# Patient Record
Sex: Female | Born: 1955
Health system: Southern US, Community
[De-identification: ages and names within clinical notes are randomized; demographics above are authoritative.]

## PROBLEM LIST (undated history)

## (undated) DIAGNOSIS — M1990 Unspecified osteoarthritis, unspecified site: Secondary | ICD-10-CM

## (undated) DIAGNOSIS — K08109 Complete loss of teeth, unspecified cause, unspecified class: Secondary | ICD-10-CM

## (undated) DIAGNOSIS — J449 Chronic obstructive pulmonary disease, unspecified: Secondary | ICD-10-CM

## (undated) DIAGNOSIS — F329 Major depressive disorder, single episode, unspecified: Secondary | ICD-10-CM

## (undated) DIAGNOSIS — E782 Mixed hyperlipidemia: Secondary | ICD-10-CM

## (undated) DIAGNOSIS — I2510 Atherosclerotic heart disease of native coronary artery without angina pectoris: Secondary | ICD-10-CM

## (undated) DIAGNOSIS — E119 Type 2 diabetes mellitus without complications: Secondary | ICD-10-CM

## (undated) DIAGNOSIS — E538 Deficiency of other specified B group vitamins: Secondary | ICD-10-CM

## (undated) DIAGNOSIS — N181 Chronic kidney disease, stage 1: Secondary | ICD-10-CM

## (undated) DIAGNOSIS — K603 Anal fistula, unspecified: Secondary | ICD-10-CM

## (undated) DIAGNOSIS — R7303 Prediabetes: Secondary | ICD-10-CM

## (undated) DIAGNOSIS — Z7901 Long term (current) use of anticoagulants: Secondary | ICD-10-CM

## (undated) DIAGNOSIS — I1A0 Resistant hypertension: Secondary | ICD-10-CM

## (undated) DIAGNOSIS — F411 Generalized anxiety disorder: Secondary | ICD-10-CM

## (undated) DIAGNOSIS — G4733 Obstructive sleep apnea (adult) (pediatric): Secondary | ICD-10-CM

## (undated) DIAGNOSIS — G4730 Sleep apnea, unspecified: Secondary | ICD-10-CM

## (undated) DIAGNOSIS — K629 Disease of anus and rectum, unspecified: Secondary | ICD-10-CM

## (undated) DIAGNOSIS — R112 Nausea with vomiting, unspecified: Secondary | ICD-10-CM

## (undated) DIAGNOSIS — F419 Anxiety disorder, unspecified: Secondary | ICD-10-CM

## (undated) DIAGNOSIS — K5090 Crohn's disease, unspecified, without complications: Secondary | ICD-10-CM

## (undated) DIAGNOSIS — K56609 Unspecified intestinal obstruction, unspecified as to partial versus complete obstruction: Secondary | ICD-10-CM

## (undated) DIAGNOSIS — C44629 Squamous cell carcinoma of skin of left upper limb, including shoulder: Secondary | ICD-10-CM

## (undated) DIAGNOSIS — Z8719 Personal history of other diseases of the digestive system: Secondary | ICD-10-CM

## (undated) DIAGNOSIS — Z9889 Other specified postprocedural states: Secondary | ICD-10-CM

## (undated) DIAGNOSIS — I10 Essential (primary) hypertension: Secondary | ICD-10-CM

## (undated) DIAGNOSIS — T7840XA Allergy, unspecified, initial encounter: Secondary | ICD-10-CM

## (undated) DIAGNOSIS — K5080 Crohn's disease of both small and large intestine without complications: Secondary | ICD-10-CM

## (undated) DIAGNOSIS — F32A Depression, unspecified: Secondary | ICD-10-CM

## (undated) DIAGNOSIS — E559 Vitamin D deficiency, unspecified: Secondary | ICD-10-CM

## (undated) DIAGNOSIS — N63 Unspecified lump in unspecified breast: Secondary | ICD-10-CM

## (undated) HISTORY — PX: ABDOMINAL HYSTERECTOMY: SHX81

## (undated) HISTORY — DX: Essential (primary) hypertension: I10

## (undated) HISTORY — DX: Anxiety disorder, unspecified: F41.9

## (undated) HISTORY — DX: Allergy, unspecified, initial encounter: T78.40XA

## (undated) HISTORY — DX: Crohn's disease, unspecified, without complications: K50.90

## (undated) HISTORY — DX: Major depressive disorder, single episode, unspecified: F32.9

## (undated) HISTORY — DX: Chronic obstructive pulmonary disease, unspecified: J44.9

## (undated) HISTORY — PX: APPENDECTOMY: SHX54

## (undated) HISTORY — DX: Unspecified intestinal obstruction, unspecified as to partial versus complete obstruction: K56.609

## (undated) HISTORY — DX: Type 2 diabetes mellitus without complications: E11.9

## (undated) HISTORY — PX: TONSILLECTOMY: SUR1361

## (undated) HISTORY — PX: SMALL INTESTINE SURGERY: SHX150

## (undated) HISTORY — DX: Vitamin D deficiency, unspecified: E55.9

## (undated) HISTORY — DX: Unspecified lump in unspecified breast: N63.0

## (undated) HISTORY — PX: BREAST SURGERY: SHX581

## (undated) HISTORY — DX: Squamous cell carcinoma of skin of left upper limb, including shoulder: C44.629

## (undated) HISTORY — DX: Depression, unspecified: F32.A

---
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
## 1998-02-16 ENCOUNTER — Emergency Department (HOSPITAL_COMMUNITY): Admission: EM | Admit: 1998-02-16 | Discharge: 1998-02-16 | Payer: Self-pay | Admitting: Emergency Medicine

## 1998-04-30 ENCOUNTER — Encounter: Payer: Self-pay | Admitting: Emergency Medicine

## 1998-04-30 ENCOUNTER — Inpatient Hospital Stay (HOSPITAL_COMMUNITY): Admission: EM | Admit: 1998-04-30 | Discharge: 1998-05-02 | Payer: Self-pay | Admitting: Emergency Medicine

## 1998-10-07 ENCOUNTER — Other Ambulatory Visit: Admission: RE | Admit: 1998-10-07 | Discharge: 1998-10-07 | Payer: Self-pay | Admitting: Family Medicine

## 1999-10-20 ENCOUNTER — Encounter: Admission: RE | Admit: 1999-10-20 | Discharge: 1999-10-20 | Payer: Self-pay | Admitting: Family Medicine

## 1999-10-20 ENCOUNTER — Encounter: Payer: Self-pay | Admitting: Family Medicine

## 2000-06-03 ENCOUNTER — Ambulatory Visit (HOSPITAL_COMMUNITY): Admission: RE | Admit: 2000-06-03 | Discharge: 2000-06-03 | Payer: Self-pay | Admitting: Family Medicine

## 2000-08-28 ENCOUNTER — Observation Stay (HOSPITAL_COMMUNITY): Admission: EM | Admit: 2000-08-28 | Discharge: 2000-08-29 | Payer: Self-pay | Admitting: Emergency Medicine

## 2000-08-28 ENCOUNTER — Encounter: Payer: Self-pay | Admitting: Gastroenterology

## 2001-01-07 ENCOUNTER — Other Ambulatory Visit: Admission: RE | Admit: 2001-01-07 | Discharge: 2001-01-07 | Payer: Self-pay | Admitting: Family Medicine

## 2001-09-20 ENCOUNTER — Emergency Department (HOSPITAL_COMMUNITY): Admission: EM | Admit: 2001-09-20 | Discharge: 2001-09-21 | Payer: Self-pay | Admitting: Emergency Medicine

## 2001-09-21 ENCOUNTER — Encounter: Payer: Self-pay | Admitting: Emergency Medicine

## 2002-02-10 ENCOUNTER — Other Ambulatory Visit: Admission: RE | Admit: 2002-02-10 | Discharge: 2002-02-10 | Payer: Self-pay | Admitting: Family Medicine

## 2002-11-06 ENCOUNTER — Encounter: Admission: RE | Admit: 2002-11-06 | Discharge: 2002-11-06 | Payer: Self-pay | Admitting: Family Medicine

## 2002-11-06 ENCOUNTER — Encounter: Payer: Self-pay | Admitting: Family Medicine

## 2004-11-07 ENCOUNTER — Emergency Department (HOSPITAL_COMMUNITY): Admission: EM | Admit: 2004-11-07 | Discharge: 2004-11-07 | Payer: Self-pay | Admitting: Emergency Medicine

## 2005-08-20 ENCOUNTER — Emergency Department (HOSPITAL_COMMUNITY): Admission: EM | Admit: 2005-08-20 | Discharge: 2005-08-20 | Payer: Self-pay | Admitting: Emergency Medicine

## 2005-11-07 ENCOUNTER — Encounter: Admission: RE | Admit: 2005-11-07 | Discharge: 2005-11-07 | Payer: Self-pay | Admitting: Gastroenterology

## 2006-01-29 ENCOUNTER — Encounter: Payer: Self-pay | Admitting: Emergency Medicine

## 2006-01-30 ENCOUNTER — Inpatient Hospital Stay (HOSPITAL_COMMUNITY): Admission: EM | Admit: 2006-01-30 | Discharge: 2006-02-01 | Payer: Self-pay | Admitting: Internal Medicine

## 2006-04-28 ENCOUNTER — Inpatient Hospital Stay (HOSPITAL_COMMUNITY): Admission: AD | Admit: 2006-04-28 | Discharge: 2006-05-02 | Payer: Self-pay | Admitting: Psychiatry

## 2006-04-28 ENCOUNTER — Ambulatory Visit: Payer: Self-pay | Admitting: Psychiatry

## 2006-08-12 ENCOUNTER — Ambulatory Visit: Payer: Self-pay | Admitting: Internal Medicine

## 2006-09-23 ENCOUNTER — Ambulatory Visit: Payer: Self-pay | Admitting: Internal Medicine

## 2006-09-26 ENCOUNTER — Ambulatory Visit (HOSPITAL_COMMUNITY): Admission: RE | Admit: 2006-09-26 | Discharge: 2006-09-26 | Payer: Self-pay | Admitting: Family Medicine

## 2007-03-24 ENCOUNTER — Emergency Department (HOSPITAL_COMMUNITY): Admission: EM | Admit: 2007-03-24 | Discharge: 2007-03-25 | Payer: Self-pay | Admitting: Emergency Medicine

## 2007-04-14 ENCOUNTER — Telehealth (INDEPENDENT_AMBULATORY_CARE_PROVIDER_SITE_OTHER): Payer: Self-pay | Admitting: Nurse Practitioner

## 2007-08-04 ENCOUNTER — Ambulatory Visit: Payer: Self-pay | Admitting: Nurse Practitioner

## 2007-08-04 DIAGNOSIS — J069 Acute upper respiratory infection, unspecified: Secondary | ICD-10-CM | POA: Insufficient documentation

## 2007-08-04 DIAGNOSIS — I10 Essential (primary) hypertension: Secondary | ICD-10-CM | POA: Insufficient documentation

## 2007-08-18 ENCOUNTER — Telehealth (INDEPENDENT_AMBULATORY_CARE_PROVIDER_SITE_OTHER): Payer: Self-pay | Admitting: Nurse Practitioner

## 2007-08-20 ENCOUNTER — Ambulatory Visit: Payer: Self-pay | Admitting: *Deleted

## 2007-08-26 ENCOUNTER — Ambulatory Visit: Payer: Self-pay | Admitting: Family Medicine

## 2007-08-26 ENCOUNTER — Telehealth (INDEPENDENT_AMBULATORY_CARE_PROVIDER_SITE_OTHER): Payer: Self-pay | Admitting: Internal Medicine

## 2007-08-26 DIAGNOSIS — K5090 Crohn's disease, unspecified, without complications: Secondary | ICD-10-CM | POA: Insufficient documentation

## 2007-08-26 DIAGNOSIS — F32A Depression, unspecified: Secondary | ICD-10-CM | POA: Insufficient documentation

## 2007-08-26 DIAGNOSIS — R070 Pain in throat: Secondary | ICD-10-CM | POA: Insufficient documentation

## 2007-08-26 DIAGNOSIS — F329 Major depressive disorder, single episode, unspecified: Secondary | ICD-10-CM

## 2008-02-24 ENCOUNTER — Ambulatory Visit: Payer: Self-pay | Admitting: Nurse Practitioner

## 2008-02-24 DIAGNOSIS — F17200 Nicotine dependence, unspecified, uncomplicated: Secondary | ICD-10-CM | POA: Insufficient documentation

## 2008-02-24 DIAGNOSIS — H6120 Impacted cerumen, unspecified ear: Secondary | ICD-10-CM | POA: Insufficient documentation

## 2008-02-24 DIAGNOSIS — J309 Allergic rhinitis, unspecified: Secondary | ICD-10-CM | POA: Insufficient documentation

## 2008-04-30 ENCOUNTER — Ambulatory Visit: Payer: Self-pay | Admitting: Nurse Practitioner

## 2008-06-17 ENCOUNTER — Telehealth (INDEPENDENT_AMBULATORY_CARE_PROVIDER_SITE_OTHER): Payer: Self-pay | Admitting: Nurse Practitioner

## 2008-10-27 ENCOUNTER — Encounter (INDEPENDENT_AMBULATORY_CARE_PROVIDER_SITE_OTHER): Payer: Self-pay | Admitting: Nurse Practitioner

## 2008-12-31 ENCOUNTER — Encounter (INDEPENDENT_AMBULATORY_CARE_PROVIDER_SITE_OTHER): Payer: Self-pay | Admitting: Nurse Practitioner

## 2008-12-31 ENCOUNTER — Ambulatory Visit: Payer: Self-pay | Admitting: Nurse Practitioner

## 2008-12-31 DIAGNOSIS — E8941 Symptomatic postprocedural ovarian failure: Secondary | ICD-10-CM | POA: Insufficient documentation

## 2008-12-31 LAB — CONVERTED CEMR LAB
Blood Glucose, Fingerstick: 93
Protein, U semiquant: 30
Specific Gravity, Urine: >=1.03
WBC Urine, dipstick: NEGATIVE
pH: 5.5

## 2009-01-06 ENCOUNTER — Encounter (INDEPENDENT_AMBULATORY_CARE_PROVIDER_SITE_OTHER): Payer: Self-pay | Admitting: Nurse Practitioner

## 2009-01-06 LAB — CONVERTED CEMR LAB
ALT: 25 units/L (ref 0–35)
AST: 23 units/L (ref 0–37)
Albumin: 4.1 g/dL (ref 3.5–5.2)
Alkaline Phosphatase: 65 units/L (ref 39–117)
BUN: 12 mg/dL (ref 6–23)
CO2: 22 meq/L (ref 19–32)
Chloride: 106 meq/L (ref 96–112)
Creatinine, Ser: 0.86 mg/dL (ref 0.40–1.20)
Eosinophils Relative: 2 % (ref 0–5)
Glucose, Bld: 84 mg/dL (ref 70–99)
HDL: 35 mg/dL — ABNORMAL LOW (ref >39)
HIV-2 Ab: NEGATIVE
Lymphocytes Relative: 37 % (ref 12–46)
Lymphs Abs: 4.5 10*3/uL — ABNORMAL HIGH (ref 0.7–4.0)
Monocytes Absolute: 1 10*3/uL (ref 0.1–1.0)
Monocytes Relative: 8 % (ref 3–12)
Neutro Abs: 6.6 10*3/uL (ref 1.7–7.7)
Platelets: 278 10*3/uL (ref 150–400)
RBC: 4.74 M/uL (ref 3.87–5.11)
Sodium: 145 meq/L (ref 135–145)
Total Protein: 7.1 g/dL (ref 6.0–8.3)
Triglycerides: 137 mg/dL (ref <150)

## 2009-01-07 ENCOUNTER — Encounter (INDEPENDENT_AMBULATORY_CARE_PROVIDER_SITE_OTHER): Payer: Self-pay | Admitting: Nurse Practitioner

## 2009-01-10 ENCOUNTER — Ambulatory Visit (HOSPITAL_COMMUNITY): Admission: RE | Admit: 2009-01-10 | Discharge: 2009-01-10 | Payer: Self-pay | Admitting: Internal Medicine

## 2009-01-11 ENCOUNTER — Encounter (INDEPENDENT_AMBULATORY_CARE_PROVIDER_SITE_OTHER): Payer: Self-pay | Admitting: Nurse Practitioner

## 2009-01-11 ENCOUNTER — Ambulatory Visit: Payer: Self-pay | Admitting: Internal Medicine

## 2009-01-24 ENCOUNTER — Encounter (INDEPENDENT_AMBULATORY_CARE_PROVIDER_SITE_OTHER): Payer: Self-pay | Admitting: Nurse Practitioner

## 2009-04-01 ENCOUNTER — Ambulatory Visit: Payer: Self-pay | Admitting: Nurse Practitioner

## 2009-04-01 DIAGNOSIS — IMO0001 Reserved for inherently not codable concepts without codable children: Secondary | ICD-10-CM | POA: Insufficient documentation

## 2009-05-05 ENCOUNTER — Encounter (INDEPENDENT_AMBULATORY_CARE_PROVIDER_SITE_OTHER): Payer: Self-pay | Admitting: Nurse Practitioner

## 2009-05-14 LAB — HM COLONOSCOPY: HM Colonoscopy: NORMAL

## 2009-05-14 LAB — HM MAMMOGRAPHY: HM Mammogram: NORMAL

## 2009-07-05 ENCOUNTER — Ambulatory Visit: Payer: Self-pay | Admitting: Nurse Practitioner

## 2009-07-19 ENCOUNTER — Ambulatory Visit: Payer: Self-pay | Admitting: Nurse Practitioner

## 2009-12-30 ENCOUNTER — Telehealth (INDEPENDENT_AMBULATORY_CARE_PROVIDER_SITE_OTHER): Payer: Self-pay | Admitting: Nurse Practitioner

## 2010-01-05 ENCOUNTER — Telehealth (INDEPENDENT_AMBULATORY_CARE_PROVIDER_SITE_OTHER): Payer: Self-pay | Admitting: Nurse Practitioner

## 2010-01-30 ENCOUNTER — Ambulatory Visit: Payer: Self-pay | Admitting: Nurse Practitioner

## 2010-01-30 DIAGNOSIS — K5900 Constipation, unspecified: Secondary | ICD-10-CM | POA: Insufficient documentation

## 2010-01-30 LAB — CONVERTED CEMR LAB
AST: 21 units/L (ref 0–37)
Alkaline Phosphatase: 57 units/L (ref 39–117)
Bilirubin Urine: NEGATIVE
CO2: 24 meq/L (ref 19–32)
Creatinine, Ser: 0.8 mg/dL (ref 0.40–1.20)
Creatinine, Urine: 132.9 mg/dL
Glucose, Bld: 99 mg/dL (ref 70–99)
HDL: 38 mg/dL — ABNORMAL LOW (ref >39)
Ketones, urine, test strip: NEGATIVE
LDL Cholesterol: 101 mg/dL — ABNORMAL HIGH (ref 0–99)
Microalb Creat Ratio: 3.8 mg/g (ref 0.0–30.0)
Microalb, Ur: 0.5 mg/dL (ref 0.00–1.89)
Nitrite: NEGATIVE
Specific Gravity, Urine: 1.015
TSH: 0.695 microintl units/mL (ref 0.350–4.500)
Total Bilirubin: 0.5 mg/dL (ref 0.3–1.2)
Total CHOL/HDL Ratio: 4.2
Total Protein: 7 g/dL (ref 6.0–8.3)
Triglycerides: 103 mg/dL (ref <150)
pH: 5.5

## 2010-01-31 ENCOUNTER — Encounter (INDEPENDENT_AMBULATORY_CARE_PROVIDER_SITE_OTHER): Payer: Self-pay | Admitting: Nurse Practitioner

## 2010-02-02 ENCOUNTER — Ambulatory Visit (HOSPITAL_COMMUNITY): Admission: RE | Admit: 2010-02-02 | Discharge: 2010-02-02 | Payer: Self-pay | Admitting: Internal Medicine

## 2010-06-15 NOTE — Letter (Signed)
Summary: TEST ORDER MAMMOGRAM//APPT DATE &TIME  TEST ORDER MAMMOGRAM//APPT DATE &TIME   Imported By: Arta Bruce 01/30/2010 12:21:33  _____________________________________________________________________  External Attachment:    Type:   Image     Comment:   External Document

## 2010-06-15 NOTE — Assessment & Plan Note (Signed)
Summary: Complete Physical Exam   Vital Signs:  Patient profile:   55 year old female Menstrual status:  partial hysterctomy Height:      63 inches Weight:      238 pounds Temp:     97.1 degrees F oral Pulse rate:   76 / minute Pulse rhythm:   regular Resp:     18 per minute BP sitting:   120 / 82  (left arm) Cuff size:   large CC: Depression, Hypertension Management Is Patient Diabetic? No Pain Assessment Patient in pain? no       Does patient need assistance? Functional Status Self care Ambulation Normal   CC:  Depression and Hypertension Management.  History of Present Illness:  Pt into the office for a complete physical exam.  PAP - last done in 12/2008 but pt is s/p partial hysterectomy.  No family hx of cervical or ovarian cancer.  Mammogram - last done in 2010.  No family history of breast cancer. Pt does check her breasts at home.   Pt did not bring her medications into the office.  Obesity - pt admits that she has gained weight since her last visit here.  She has gained 6 pounds since her last visit.  She slacked off her exercise routine.     The patient denies that she feels like life is not worth living, denies that she wishes that she were dead, and denies that she has thought about ending her life.        Comments:  Pt is going to the [facility]. She is did not bring her medications with her today to this office but reports she is taking the same medications.  Hypertension History:      She denies headache, chest pain, and palpitations.  She notes no problems with any antihypertensive medication side effects.        Positive major cardiovascular risk factors include hypertension and current tobacco user.  Negative major cardiovascular risk factors include female age less than 23 years old, no history of diabetes or hyperlipidemia, and negative family history for ischemic heart disease.        Further assessment for target organ damage reveals no  history of ASHD, cardiac end-organ damage (CHF/LVH), stroke/TIA, peripheral vascular disease, renal insufficiency, or hypertensive retinopathy.      Habits & Providers  Alcohol-Tobacco-Diet     Alcohol drinks/day: 0     Tobacco Status: current     Tobacco Counseling: to quit use of tobacco products     Cigarette Packs/Day: 1.0  Exercise-Depression-Behavior     Does Patient Exercise: no     Depression Counseling: not indicated; screening negative for depression     Seat Belt Use: 100     Sun Exposure: occasionally  Current Medications (verified): 1)  Lisinopril-Hydrochlorothiazide 20-25 Mg  Tabs (Lisinopril-Hydrochlorothiazide) .Marland Kitchen.. 1 Tablet By Mouth Daily For Blood Pressure 2)  Wellbutrin Xl 150 Mg  Tb24 (Bupropion Hcl) .... Take 1 Tablet By Mouth Every 12 Hours(Per [facility]) 3)  Lamictal 100 Mg  Tabs (Lamotrigine) .... Take 1 Tablet By Mouth Every 12 Hours(Per [facility]) 4)  Zoloft 50 Mg  Tabs (Sertraline Hcl) .... Take 1 Tablet By Mouth Once A Day (Per [facility]) 5)  Trazodone Hcl 50 Mg  Tabs (Trazodone Hcl) .... Take 1-2 Tablets By Mouth As Needed Insomnia (Per [facility]) 6)  Loratadine 10 Mg Tabs (Loratadine) .... One Tablet By Mouth Daily For Allergies 7)  Flonase 50 Mcg/act Susp (Fluticasone Propionate) .Marland KitchenMarland KitchenMarland Kitchen  One Spray in Each Nostril Two Times A Day  (Hold Head Down)  Allergies (verified): 1)  ! * Azathioprine  Review of Systems General:  Complains of fatigue. Eyes:  Denies blurring. ENT:  Denies earache. CV:  Complains of fatigue. Resp:  Denies cough. GI:  Complains of constipation; denies abdominal pain, nausea, and vomiting. GU:  Denies discharge. MS:  Denies joint pain. Derm:  Denies rash. Neuro:  Denies headaches. Psych:  Denies anxiety and depression.  Physical Exam  General:  alert.   Head:  normocephalic.   Eyes:  pupils equal and pupils round.   Ears:  Bil TM with minimal cerumen present bony landmarks visible  Nose:  no nasal discharge.   Mouth:  pharynx pink and  moist.   Neck:  supple.   Chest Wall:  no mass.   Breasts:  right breast with surgical scar Lungs:  normal breath sounds.   Heart:  normal rate and regular rhythm.   Abdomen:  soft, non-tender, and normal bowel sounds.  mid-abdominal incision Rectal:  defer Genitalia:  defer Msk:  up to the exam table Extremities:  no edema Neurologic:  alert & oriented X3.     Impression & Recommendations:  Problem # 1:  HYPERTENSION, BENIGN ESSENTIAL (ICD-401.1) labs done  PAP defered colonscopy up to date rec optho and dental exam PHQ-9 score = 15 but pt already a pt at the [facility] rec optho and dental exams Her updated medication list for this problem includes:    Lisinopril-hydrochlorothiazide 20-25 Mg Tabs (Lisinopril-hydrochlorothiazide) .Marland Kitchen... 1 tablet by mouth daily for blood pressure  Orders: UA Dipstick w/o Micro (manual) (81002) T-Urine Microalbumin w/creat. ratio [phone]) T-Lipid Profile [phone]) T-Comprehensive Metabolic Panel (50354-65681) Rapid HIV  (27517) T-TSH (00174-94496) EKG w/ Interpretation (93000)  Problem # 2:  UNSPECIFIED BREAST SCREENING (ICD-V76.10) self breast exams encouraged mammogram scheduled Orders: Mammogram (Screening) (Mammo)  Problem # 3:  TOBACCO ABUSE (ICD-305.1) advised cessation  Problem # 4:  OBESITY (ICD-278.00) reviewed BMI with pt  up 6 pounds since last visit  need to increase exercise Orders: T-TSH (75916-38466)  Problem # 5:  CONSTIPATION (ICD-564.00) advised pt to start a high fiber diet drink plenty of water fiber one samples given  Problem # 6:  ALLERGIC RHINITIS (ICD-477.9)  Her updated medication list for this problem includes:    Loratadine 10 Mg Tabs (Loratadine) ..... One tablet by mouth daily for allergies    Flonase 50 Mcg/act Susp (Fluticasone propionate) ..... One spray in each nostril two times a day  (hold head down)  Complete Medication List: 1)  Lisinopril-hydrochlorothiazide 20-25 Mg  Tabs (Lisinopril-hydrochlorothiazide) .Marland Kitchen.. 1 tablet by mouth daily for blood pressure 2)  Wellbutrin Xl 150 Mg Tb24 (Bupropion hcl) .... Take 1 tablet by mouth every 12 hours(per [facility]) 3)  Lamictal 100 Mg Tabs (Lamotrigine) .... Take 1 tablet by mouth every 12 hours(per [facility]) 4)  Zoloft 50 Mg Tabs (Sertraline hcl) .... Take 1 tablet by mouth once a day (per [facility]) 5)  Trazodone Hcl 50 Mg Tabs (Trazodone hcl) .... Take 1-2 tablets by mouth as needed insomnia (per [facility]) 6)  Loratadine 10 Mg Tabs (Loratadine) .... One tablet by mouth daily for allergies 7)  Flonase 50 Mcg/act Susp (Fluticasone propionate) .... One spray in each nostril two times a day  (hold head down)  Hypertension Assessment/Plan:      The patient's hypertensive risk group is category B: At least one risk factor (excluding diabetes) with no target organ damage.  Her calculated 10 year risk of coronary heart disease is 13 %.  Today's blood pressure is 120/82.  Her blood pressure goal is < 140/90.  Patient Instructions: 1)  Schedule a lab visit in 4-6 weeks for flu vaccine 2)  Constipation - start eating more foods with fiber such as fiber one, oatmeal, and raisins, apples with peeling. 3)  You will be notified of your lab results 4)  Follow up in 6 months with n.martin,fnp for high blood pressure Prescriptions: LORATADINE 10 MG TABS (LORATADINE) One tablet by mouth daily for allergies  #30 x 5   Entered and Authorized by:   Aurora Mask FNP   Signed by:   Aurora Mask FNP on 01/30/2010   Method used:   Electronically to        [facility] [phone]* (retail)       [address]       [place], [place]  [address]       Ph: [phone]       Fax: [phone]   RxID:   [id number]   Prevention & Chronic Care Immunizations   Influenza vaccine: Fluvax 3+  (04/01/2009)    Tetanus booster: 12/31/2008: Tdap    Pneumococcal vaccine: Not documented  Colorectal Screening   Hemoccult: Not documented   Hemoccult  action/deferral: Not indicated  (01/30/2010)    Colonoscopy: Done by Dr. Benson Norway  (12/12/2009)  Other Screening   Pap smear:  Specimen Adequacy: Satisfactory for evaluation.   Interpretation/Result:Negative for intraepithelial Lesion or Malignancy.     (12/31/2008)   Pap smear action/deferral: Deferred-3 yr interval  (01/30/2010)   Pap smear due: 01/01/2012    Mammogram: ASSESSMENT: Negative - BI-RADS 1^MM DIGITAL SCREENING  (01/10/2009)   Mammogram action/deferral: Ordered  (01/30/2010)   Smoking status: current  (01/30/2010)   Smoking cessation counseling: yes  (02/24/2008)  Lipids   Total Cholesterol: 181  (12/31/2008)   Lipid panel action/deferral: Lipid Panel ordered   LDL: 119  (12/31/2008)   LDL Direct: Not documented   HDL: 35  (12/31/2008)   Triglycerides: 137  (12/31/2008)  Hypertension   Last Blood Pressure: 120 / 82  (01/30/2010)   Serum creatinine: 0.86  (12/31/2008)   Serum potassium 4.0  (12/31/2008) CMP ordered   Self-Management Support :    Hypertension self-management support: Pre-printed educational material  (01/30/2010)   EKG  Procedure date:  01/30/2010  Findings:      NSR   Laboratory Results   Urine Tests  Date/Time Received: January 30, 2010 9:37 AM   Routine Urinalysis   Color: lt. yellow Glucose: negative   (Normal Range: Negative) Bilirubin: negative   (Normal Range: Negative) Ketone: negative   (Normal Range: Negative) Spec. Gravity: 1.015   (Normal Range: 1.003-1.035) Blood: negative   (Normal Range: Negative) pH: 5.5   (Normal Range: 5.0-8.0) Protein: negative   (Normal Range: Negative) Urobilinogen: 0.2   (Normal Range: 0-1) Nitrite: negative   (Normal Range: Negative) Leukocyte Esterace: negative   (Normal Range: Negative)     Blood Tests   Date/Time Received: January 30, 2010 10:55 AM    Date/Time Received: January 30, 2010     Appended Document: Complete Physical Exam    Clinical Lists  Changes  Observations: Added new observation of HIVRAPIDRSLT: negative (01/30/2010 15:48)      Laboratory Results    Other Tests  Rapid HIV: negative

## 2010-06-15 NOTE — Assessment & Plan Note (Signed)
Summary: HTN/Cerumen impaction   Vital Signs:  Patient profile:   55 year old female Menstrual status:  partial hysterctomy Weight:      233.1 pounds BMI:     41.44 BSA:     2.07 Temp:     98.2 degrees F rectal Pulse rate:   71 / minute Pulse rhythm:   regular Resp:     16 per minute BP sitting:   120 / 68  (left arm) Cuff size:   large  Vitals Entered By: Levon Hedger (July 05, 2009 11:45 AM) CC: follow-up visit 3 months....pt states she is need her ears cleaned out, Hypertension Management, Depression Is Patient Diabetic? No Pain Assessment Patient in pain? no       Does patient need assistance? Functional Status Self care Ambulation Normal   CC:  follow-up visit 3 months....pt states she is need her ears cleaned out, Hypertension Management, and Depression.  History of Present Illness:  Pt into the office for f/u on htn.  Acute complaint today is decreased hearing. pt did nt return following her last visit to get ears irrigated.   The patient denies that she feels like life is not worth living, denies that she wishes that she were dead, and denies that she has thought about ending her life.        Comments:  Pt continues to f/u at the [facility].  Hypertension History:      She denies headache, chest pain, and palpitations.  She notes no problems with any antihypertensive medication side effects.  pt is taking meds as ordered.        Positive major cardiovascular risk factors include hypertension and current tobacco user.  Negative major cardiovascular risk factors include female age less than 75 years old and negative family history for ischemic heart disease.        Further assessment for target organ damage reveals no history of ASHD, cardiac end-organ damage (CHF/LVH), stroke/TIA, peripheral vascular disease, renal insufficiency, or hypertensive retinopathy.      Habits & Providers  Alcohol-Tobacco-Diet     Alcohol drinks/day: 0     Tobacco  Status: current     Tobacco Counseling: to quit use of tobacco products  Exercise-Depression-Behavior     Does Patient Exercise: no     Depression Counseling: not indicated; screening negative for depression     Seat Belt Use: 100     Sun Exposure: occasionally  Current Medications (verified): 1)  Lisinopril-Hydrochlorothiazide 20-25 Mg  Tabs (Lisinopril-Hydrochlorothiazide) .Marland Kitchen.. 1 Tablet By Mouth Daily For Blood Pressure 2)  Wellbutrin Xl 150 Mg  Tb24 (Bupropion Hcl) .... Take 1 Tablet By Mouth Every 12 Hours(Per [facility]) 3)  Lamictal 100 Mg  Tabs (Lamotrigine) .... Take 1 Tablet By Mouth Every 12 Hours(Per [facility]) 4)  Zoloft 50 Mg  Tabs (Sertraline Hcl) .... Take 1 Tablet By Mouth Once A Day (Per [facility]) 5)  Trazodone Hcl 50 Mg  Tabs (Trazodone Hcl) .... Take 1-2 Tablets By Mouth As Needed Insomnia (Per [facility]) 6)  Loratadine 10 Mg Tabs (Loratadine) .... One Tablet By Mouth Daily For Allergies 7)  Flonase 50 Mcg/act Susp (Fluticasone Propionate) .... One Spray in Each Nostril Two Times A Day  (Hold Head Down)  Allergies (verified): 1)  ! * Azathioprine  Review of Systems ENT:  Complains of decreased hearing. CV:  Denies chest pain or discomfort. GI:  Complains of constipation and diarrhea; intermittent - unable to get colonscopy due to cost.  Physical Exam  General:  alert.   Head:  normocephalic.   Ears:  cerumen impaction prior to irrigation -  Mouth:  fair dentition.   Lungs:  normal breath sounds.   Heart:  normal rate and regular rhythm.   Abdomen:  normal bowel sounds.     Impression & Recommendations:  Problem # 1:  HYPERTENSION, BENIGN ESSENTIAL (ICD-401.1) DASH diet stable Her updated medication list for this problem includes:    Lisinopril-hydrochlorothiazide 20-25 Mg Tabs (Lisinopril-hydrochlorothiazide) .Marland Kitchen... 1 tablet by mouth daily for blood pressure  Problem # 2:  TOBACCO ABUSE (ICD-305.1) advised cessation  Problem # 3:  OBESITY (ICD-278.00) advised pt to  continue to lose weight  Problem # 4:  CROHN'S DISEASE (ICD-555.9) pt aware she need colonscopy but is unable at this time due to finances  Problem # 5:  CERUMEN IMPACTION, BILATERAL (ICD-380.4)  irrigation done today, pt tolerated well  Orders: Cerumen Impaction Removal (16109)  Complete Medication List: 1)  Lisinopril-hydrochlorothiazide 20-25 Mg Tabs (Lisinopril-hydrochlorothiazide) .Marland Kitchen.. 1 tablet by mouth daily for blood pressure 2)  Wellbutrin Xl 150 Mg Tb24 (Bupropion hcl) .... Take 1 tablet by mouth every 12 hours(per [facility]) 3)  Lamictal 100 Mg Tabs (Lamotrigine) .... Take 1 tablet by mouth every 12 hours(per [facility]) 4)  Zoloft 50 Mg Tabs (Sertraline hcl) .... Take 1 tablet by mouth once a day (per [facility]) 5)  Trazodone Hcl 50 Mg Tabs (Trazodone hcl) .... Take 1-2 tablets by mouth as needed insomnia (per [facility]) 6)  Loratadine 10 Mg Tabs (Loratadine) .... One tablet by mouth daily for allergies 7)  Flonase 50 Mcg/act Susp (Fluticasone propionate) .... One spray in each nostril two times a day  (hold head down)  Hypertension Assessment/Plan:      The patient's hypertensive risk group is category B: At least one risk factor (excluding diabetes) with no target organ damage.  Her calculated 10 year risk of coronary heart disease is 13 %.  Today's blood pressure is 120/68.  Her blood pressure goal is < 140/90.  Patient Instructions: 1)  Your blood pressure is doing well. 2)  Continue current meds. 3)  Follow up in 6 months - CPE will be due at that time

## 2010-06-15 NOTE — Progress Notes (Signed)
Summary: Office Visit//DEPRESSION SCREENING  Office Visit//DEPRESSION SCREENING   Imported By: Roland Earl 01/30/2010 14:15:47  _____________________________________________________________________  External Attachment:    Type:   Image     Comment:   External Document

## 2010-06-15 NOTE — Letter (Signed)
Summary: Lipid Letter  [facility]  [address] [place], [place] [address]   Phone: [phone]  Fax: [phone]    01/31/2010  April May [address] [place], [place]  [address]  Dear April May:  We have carefully reviewed your last lipid profile from 01/30/2010 and the results are noted below with a summary of recommendations for lipid management.    Cholesterol:       160     Goal: less than 200   HDL "good" Cholesterol:   38     Goal: Greater than 40   LDL "bad" Cholesterol:   101     Goal: less than 130   Triglycerides:       103     Goal: less than 150    Labs done during your recent office visit are normal.    Current Medications: 1)    Lisinopril-hydrochlorothiazide 20-25 Mg  Tabs (Lisinopril-hydrochlorothiazide) .Marland Kitchen.. 1 tablet by mouth daily for blood pressure 2)    Wellbutrin Xl 150 Mg  Tb24 (Bupropion hcl) .... Take 1 tablet by mouth every 12 hours(per [facility]) 3)    Lamictal 100 Mg  Tabs (Lamotrigine) .... Take 1 tablet by mouth every 12 hours(per [facility]) 4)    Zoloft 50 Mg  Tabs (Sertraline hcl) .... Take 1 tablet by mouth once a day (per [facility]) 5)    Trazodone Hcl 50 Mg  Tabs (Trazodone hcl) .... Take 1-2 tablets by mouth as needed insomnia (per [facility]) 6)    Loratadine 10 Mg Tabs (Loratadine) .... One tablet by mouth daily for allergies 7)    Flonase 50 Mcg/act Susp (Fluticasone propionate) .... One spray in each nostril two times a day  (hold head down)  If you have any questions, please call. We appreciate being able to work with you.   Sincerely,    [facility] April Mask FNP

## 2010-06-15 NOTE — Progress Notes (Signed)
°  Phone Note Call from Patient   Summary of Call: pt came in request nasacort aq...Marland KitchenMarland Kitchen per Tori Milks ok to give sample... Initial call taken by: [place] Shannon,  June 17, 2008 10:19 AM

## 2010-06-15 NOTE — Progress Notes (Signed)
Summary: GI REFERRAL   Phone Note Outgoing Call   Summary of Call: F.Y.I. I SEND AREFERRAL TO MS Petti TO [facility] .12-28-09 BUT THEY CAN'T SCHEDULE HER AN APPT BECAUSE SHE HAS A BALANCE SO I'M GOING TO WAIT  NEXT MONTH TO SCHEDULE [facility] (ON CALL) . Initial call taken by: Cheryll Dessert,  January 05, 2010 11:40 AM  Follow-up for Phone Call        ok. thanks for the update. Be sure that pt is aware too so that she will know we are working on her appt Follow-up by: Lehman Prom FNP,  January 09, 2010 8:39 AM  Additional Follow-up for Phone Call Additional follow up Details #1::        Ms Volkert when to Dr Jeani Hawking 2 weeks ago and had her  Colonoscopy and she is fine  Additional Follow-up by: Cheryll Dessert,  January 12, 2010 10:58 AM      Colonoscopy  Procedure date:  12/12/2009  Findings:      Done by Dr. Elnoria Howard   Colonoscopy  Procedure date:  12/12/2009  Findings:      Done by Dr. Elnoria Howard

## 2010-06-15 NOTE — Progress Notes (Signed)
Summary: Query: Refill Lisinopril-HCTZ  Phone Note Outgoing Call   Summary of Call: Do you want her lisinopril refilled?  Last visit 3/11. Initial call taken by: Sherian Maroon RN,  December 30, 2009 2:33 PM  Follow-up for Phone Call        Yes - i filled it since it is after hours but call pt and remind her that CPE done 12/2008 so she is due and can schedule at any time Follow-up by: Aurora Mask FNP,  December 30, 2009 6:30 PM  Additional Follow-up for Phone Call Additional follow up Details #1::        Left message on answering machine for pt. to return call.  Sherian Maroon RN  January 02, 2010 9:18 AM  Advised of refill and need for appt. -- will call back to schedule CPE.  Sherian Maroon RN  January 02, 2010 12:44 PM

## 2010-06-15 NOTE — Assessment & Plan Note (Signed)
Summary: Acute - Allergic Rhinitis   Vital Signs:  Patient profile:   55 year old female Menstrual status:  partial hysterctomy Weight:      232.6 pounds BSA:     2.06 Temp:     98.2 degrees F oral Pulse rate:   87 / minute Pulse rhythm:   regular Resp:     20 per minute BP sitting:   127 / 82  (left arm) Cuff size:   large  Vitals Entered By: Gaylyn Cheers RN (July 19, 2009 11:00 AM) CC: one wk. coughing, tired, sweats, yellow nasal D/C, ears popping/itching, neck lymph nodes swollen, eyes itching, Hypertension Management Is Patient Diabetic? No Pain Assessment Patient in pain? no       Does patient need assistance? Functional Status Self care Ambulation Normal Comments pt did not bring meds states nothing has changed, needs refills   CC:  one wk. coughing, tired, sweats, yellow nasal D/C, ears popping/itching, neck lymph nodes swollen, eyes itching, and Hypertension Management.  History of Present Illness:  Pt into the office with complaints of cough/congestion Started 1 week ago. She has been taking [place] HBP over the counter +aches at onset of symptoms which have subside +cough (productive) +weakness, fatigue +appetite is present -Diarrhea, nausea or vomiting -ear pain -sore throat +chills present mostly at night but some during the day Pt has allergy pills prescribed but she admits that she was not taking on a regular basis because she only thought she needed to take it during "allergy season"  Hypertension History:      She denies headache, chest pain, and palpitations.  She notes no problems with any antihypertensive medication side effects.        Positive major cardiovascular risk factors include hypertension and current tobacco user.  Negative major cardiovascular risk factors include female age less than 52 years old and negative family history for ischemic heart disease.        Further assessment for target organ damage reveals no history of ASHD,  cardiac end-organ damage (CHF/LVH), stroke/TIA, peripheral vascular disease, renal insufficiency, or hypertensive retinopathy.     Habits & Providers  Alcohol-Tobacco-Diet     Alcohol drinks/day: 0     Tobacco Status: current     Tobacco Counseling: to quit use of tobacco products     Cigarette Packs/Day: 1.0  Exercise-Depression-Behavior     Does Patient Exercise: no     Depression Counseling: not indicated; screening negative for depression     Seat Belt Use: 100     Sun Exposure: occasionally  Allergies (verified): 1)  ! * Azathioprine  Review of Systems General:  Complains of chills; denies fever and loss of appetite. ENT:  Complains of hoarseness; denies earache, nasal congestion, and sore throat. CV:  Denies chest pain or discomfort and fatigue. Resp:  Complains of cough; denies shortness of breath and wheezing.  Physical Exam  General:  alert.  obese Head:  normocephalic.   Ears:  bil TM with clear fluid Mouth:  fair dentition.   Lungs:  few scattered rhonchi Heart:  normal rate and regular rhythm.   Abdomen:  non-tender and normal bowel sounds.   Msk:  up to the exam table Neurologic:  alert & oriented X3.     Impression & Recommendations:  Problem # 1:  ALLERGIC RHINITIS (ICD-477.9) no need for antibiotics at this time pt encouraged to take allergy meds get mucinex for cough/congestion drink warm tea f/u if symptoms continue or worsen Her  updated medication list for this problem includes:    Loratadine 10 Mg Tabs (Loratadine) ..... One tablet by mouth daily for allergies    Flonase 50 Mcg/act Susp (Fluticasone propionate) ..... One spray in each nostril two times a day  (hold head down)  Problem # 2:  TOBACCO ABUSE (ICD-305.1) advised cessation  Complete Medication List: 1)  Lisinopril-hydrochlorothiazide 20-25 Mg Tabs (Lisinopril-hydrochlorothiazide) .Marland Kitchen.. 1 tablet by mouth daily for blood pressure 2)  Wellbutrin Xl 150 Mg Tb24 (Bupropion hcl) ....  Take 1 tablet by mouth every 12 hours(per [facility]) 3)  Lamictal 100 Mg Tabs (Lamotrigine) .... Take 1 tablet by mouth every 12 hours(per [facility]) 4)  Zoloft 50 Mg Tabs (Sertraline hcl) .... Take 1 tablet by mouth once a day (per [facility]) 5)  Trazodone Hcl 50 Mg Tabs (Trazodone hcl) .... Take 1-2 tablets by mouth as needed insomnia (per [facility]) 6)  Loratadine 10 Mg Tabs (Loratadine) .... One tablet by mouth daily for allergies 7)  Flonase 50 Mcg/act Susp (Fluticasone propionate) .... One spray in each nostril two times a day  (hold head down)  Hypertension Assessment/Plan:      The patient's hypertensive risk group is category B: At least one risk factor (excluding diabetes) with no target organ damage.  Her calculated 10 year risk of coronary heart disease is 13 %.  Today's blood pressure is 127/82.  Her blood pressure goal is < 140/90.  Patient Instructions: 1)  Get plain Mucinex for cough and congestion. NO DM AS THIS WILL RAISE YOUR BLOOD PRESSURE 2)  Nasal congestion - restart nasal spray twice per day 3)  Allergies - restart your allergy pill. 4)  Follow up as needed if symptoms worsen

## 2010-08-16 ENCOUNTER — Encounter: Payer: Self-pay | Admitting: Internal Medicine

## 2010-08-16 ENCOUNTER — Other Ambulatory Visit (INDEPENDENT_AMBULATORY_CARE_PROVIDER_SITE_OTHER): Payer: Medicare Other

## 2010-08-16 ENCOUNTER — Ambulatory Visit (INDEPENDENT_AMBULATORY_CARE_PROVIDER_SITE_OTHER): Payer: Medicare Other | Admitting: Internal Medicine

## 2010-08-16 VITALS — BP 132/76 | HR 81 | Temp 98.7°F | Ht 63.0 in | Wt 242.8 lb

## 2010-08-16 DIAGNOSIS — J309 Allergic rhinitis, unspecified: Secondary | ICD-10-CM

## 2010-08-16 DIAGNOSIS — I10 Essential (primary) hypertension: Secondary | ICD-10-CM

## 2010-08-16 DIAGNOSIS — L989 Disorder of the skin and subcutaneous tissue, unspecified: Secondary | ICD-10-CM

## 2010-08-16 LAB — CBC WITH DIFFERENTIAL/PLATELET
Eosinophils Absolute: 0.2 10*3/uL (ref 0.0–0.7)
Eosinophils Relative: 1.8 % (ref 0.0–5.0)
Hemoglobin: 14.6 g/dL (ref 12.0–15.0)
Lymphocytes Relative: 29.7 % (ref 12.0–46.0)
MCHC: 33.9 g/dL (ref 30.0–36.0)
Monocytes Absolute: 1 10*3/uL (ref 0.1–1.0)
Neutrophils Relative %: 60.4 % (ref 43.0–77.0)

## 2010-08-16 LAB — COMPREHENSIVE METABOLIC PANEL WITH GFR
ALT: 34 U/L (ref 0–35)
AST: 33 U/L (ref 0–37)
Albumin: 3.9 g/dL (ref 3.5–5.2)
Alkaline Phosphatase: 68 U/L (ref 39–117)
BUN: 7 mg/dL (ref 6–23)
CO2: 30 meq/L (ref 19–32)
Calcium: 9.5 mg/dL (ref 8.4–10.5)
Chloride: 105 meq/L (ref 96–112)
Creatinine, Ser: 0.8 mg/dL (ref 0.4–1.2)
GFR: 100.16 mL/min
Glucose, Bld: 92 mg/dL (ref 70–99)
Potassium: 3.5 meq/L (ref 3.5–5.1)
Sodium: 143 meq/L (ref 135–145)
Total Bilirubin: 0.5 mg/dL (ref 0.3–1.2)
Total Protein: 7.2 g/dL (ref 6.0–8.3)

## 2010-08-16 NOTE — Assessment & Plan Note (Signed)
Dermatology referral at her request to see about having this lesion removed

## 2010-08-16 NOTE — Progress Notes (Signed)
Subjective:    Patient ID: April May, female    DOB: 11-25-1955, 55 y.o.   MRN: [id number]  Hypertension This is a chronic problem. The current episode started more than 1 year ago. The problem has been gradually improving since onset. The problem is controlled. Pertinent negatives include no anxiety, blurred vision, chest pain, headaches, malaise/fatigue, neck pain, orthopnea, palpitations, peripheral edema, PND, shortness of breath or sweats. There are no associated agents to hypertension. Past treatments include ACE inhibitors and diuretics. The current treatment provides significant improvement. There are no compliance problems.  There is no history of angina, kidney disease, CAD/MI, CVA, heart failure, left ventricular hypertrophy, PVD or retinopathy.      Review of Systems  Constitutional: Negative for fever, chills, malaise/fatigue, diaphoresis, activity change, appetite change, fatigue and unexpected weight change.  HENT: Positive for congestion, sneezing and postnasal drip. Negative for nosebleeds, sore throat, facial swelling, rhinorrhea, drooling, trouble swallowing, neck pain, neck stiffness, dental problem, voice change, sinus pressure and tinnitus.   Eyes: Negative for blurred vision.  Respiratory: Negative for apnea, cough, choking, chest tightness, shortness of breath, wheezing and stridor.   Cardiovascular: Negative for chest pain, palpitations, orthopnea, leg swelling and PND.  Gastrointestinal: Negative for nausea, vomiting, abdominal pain, diarrhea, constipation, blood in stool and abdominal distention.  Genitourinary: Negative for dysuria, urgency, frequency, hematuria, flank pain, decreased urine volume, vaginal discharge, enuresis, difficulty urinating and dyspareunia.  Musculoskeletal: Negative for myalgias, back pain, joint swelling, arthralgias and gait problem.  Skin: Positive for rash (she has had a bump on the back of her left thigh for several months and she  wants to have it removed). Negative for color change and pallor.  Neurological: Negative for dizziness, tremors, seizures, syncope, facial asymmetry, speech difficulty, weakness, light-headedness, numbness and headaches.  Hematological: Negative for adenopathy. Does not bruise/bleed easily.  Psychiatric/Behavioral: Negative for suicidal ideas, hallucinations, behavioral problems, confusion, sleep disturbance, self-injury, dysphoric mood, decreased concentration and agitation. The patient is not nervous/anxious and is not hyperactive.        Objective:   Physical Exam  Constitutional: She is oriented to person, place, and time. She appears well-developed and well-nourished. No distress.  HENT:  Head: Normocephalic and atraumatic.  Right Ear: External ear normal.  Left Ear: External ear normal.  Nose: Nose normal.  Mouth/Throat: Oropharynx is clear and moist. No oropharyngeal exudate.  Eyes: Conjunctivae and EOM are normal. Pupils are equal, round, and reactive to light. Right eye exhibits no discharge. Left eye exhibits no discharge. No scleral icterus.  Neck: Normal range of motion. Neck supple. No thyromegaly present.  Cardiovascular: Normal rate, regular rhythm, normal heart sounds and intact distal pulses.  Exam reveals no gallop and no friction rub.   No murmur heard. Pulmonary/Chest: Effort normal and breath sounds normal. No respiratory distress. She has no wheezes. She has no rales. She exhibits no tenderness.  Abdominal: Soft. Bowel sounds are normal. She exhibits no distension and no mass. There is no tenderness. There is no rebound and no guarding.  Musculoskeletal: Normal range of motion. She exhibits no edema and no tenderness.  Lymphadenopathy:    She has no cervical adenopathy.  Neurological: She is alert and oriented to person, place, and time. She has normal reflexes.  Skin: Skin is warm and dry. Rash (there is  soft, fleshy dome-shaped papule on her left posterior thigh)  noted. She is not diaphoretic. No erythema. No pallor.  Psychiatric: She has a normal mood and affect. Her behavior  is normal. Judgment and thought content normal.          Assessment & Plan:

## 2010-08-16 NOTE — Patient Instructions (Signed)

## 2010-08-16 NOTE — Assessment & Plan Note (Signed)
She is doing well on claritin

## 2010-08-16 NOTE — Assessment & Plan Note (Signed)
BP is well controlled, I will check her lytes and renal function today 

## 2010-08-17 ENCOUNTER — Encounter: Payer: Self-pay | Admitting: Internal Medicine

## 2010-08-17 LAB — TSH: TSH: 1.53 u[IU]/mL (ref 0.35–5.50)
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
## 2010-09-29 NOTE — Consult Note (Signed)
NAMEMESHAWN, April April           ACCOUNT NO.:  [id number]   MEDICAL RECORD NO.:  [id number]          PATIENT TYPE:  INP   LOCATION:  5729                         FACILITY:  [facility]   PHYSICIAN:  Tory Emerald. Benson Norway, MD    DATE OF BIRTH:  April April   DATE OF CONSULTATION:  01/30/2006  DATE OF DISCHARGE:                                   CONSULTATION   REASON FOR CONSULTATION:  Pancreatitis and Crohn's disease.   HISTORY OF PRESENT ILLNESS:  This is a 55 year old female with past medical  history of Crohn's disease, hypertension and depression, who is well-known  to me and is admitted to the hospital for complaints of nausea and vomiting.  The patient states that her symptoms started acutely approximately 4 days  prior to admission.  She was not able to tolerate any type of p.o. intake  and subsequently presented to the emergency room for further evaluation and  treatment.  She denies having any fevers or chills or constipation,  hematochezia; however, she does have persistent diarrhea, which is unchanged  from her prior baseline.   PAST MEDICAL HISTORY AND PAST SURGICAL HISTORY:  As stated above.   MEDICATIONS:  1. Wellbutrin 150 mg p.o. daily.  2. Prednisone 5 mg p.o. daily.  3. Enalapril 20 mg p.o. daily.  4. Azathioprine 100 mg p.o. daily.  5. Hydrochlorothiazide 12.5 mg. p.o. daily.   ALLERGIES:  NO KNOWN DRUG ALLERGIES.   SOCIAL HISTORY:  The patient is positive for smoking, no alcohol or illicit  drug use.   REVIEW OF SYSTEMS:  As per the history of present illness.  No reports of  any headaches, abdominal pain, chest pain, shortness of breath, dysuria,  dysarthria, arthritis, arthralgias, skin rashes.   PHYSICAL EXAMINATION:  VITAL SIGNS:  Stable.  Blood pressure is 100/59,  heart rate is 62, respirations 18 and temperature is 98.5.  GENERAL:  The patient is in no acute distress, alert and oriented.  HEENT:  Normocephalic, atraumatic.  Extraocular muscles intact.   Pupils are  equal, round and reactive to light.  NECK:  Supple.  No lymphadenopathy.  LUNGS:  Clear to auscultation bilaterally.  CARDIOVASCULAR:  Regular rate and rhythm without murmurs, gallops or rubs.  ABDOMEN:  Obese, soft, nontender, nondistended.  No hepatosplenomegaly.  EXTREMITIES:  No clubbing, cyanosis or edema.   LABORATORY VALUES:  White blood cell count is 10.9, hemoglobin 13.6, MCV is  93.3, platelets are 271,000.  Sodium is 137, potassium 2.9, chloride 102,  CO2 25, glucose is 97, BUN is 10, creatinine is 0.8.  Amylase is 168; lipase  is 287.  On her admission, the lipase was at 718 and her white blood cell  count was at 18.2   IMPRESSION:  1. Azathioprine-induced pancreatitis.  2. Crohn's disease.   After evaluation, the patient is clear that she does have pancreatitis as a  result of the medication used; at this time, it has been discontinued and  her symptoms have improved.  She is able to tolerate orals.  There is no  evidence of any stones with during the CT scan.  The pancreatitis  was noted  to be mild on the imaging.  No evidence of any abscess or phlegmon or  necrosis of the pancreas.  Additionally, the patient is also noted to have  persistent inflammation in her terminal ileum and she is relatively stable  at this point.   PLAN:  1. To continue with supportive care with IV hydration.  2. Agree with using Solu-Medrol for her complaints for her Crohn's disease      and most likely she can be discontinued on her antibiotics in a      relatively short period of time, now that she has been discontinued on      her azathioprine.  3. Advance diet as tolerated.      Tory Emerald Benson Norway, MD  Electronically Signed     PDH/MEDQ  D:  01/30/2006  T:  02/01/2006  Job:  [id number]

## 2010-09-29 NOTE — Discharge Summary (Signed)
[name], April May           ACCOUNT NO.:  [id number]   MEDICAL RECORD NO.:  [id number]          PATIENT TYPE:  INP   LOCATION:  5729                         FACILITY:  [facility]   PHYSICIAN:  Ashby Dawes. Polite, M.D. DATE OF BIRTH:  Feb 28, 1956   DATE OF ADMISSION:  01/30/2006  DATE OF DISCHARGE:  02/01/2006                                 DISCHARGE SUMMARY   DISCHARGE DIAGNOSES:  1. Pancreatis, mild, secondary to azathioprine.  2. Active Crohn disease, seen in consultation by gastrointestinal and      discharged on p.o. prednisone.  Azathioprine has been stopped.  3. Hypertension.  Hydrochlorothiazide has been stopped secondary to      diarrhea and hypokalemia.  Blood pressure discharged in stable      condition.  Blood pressure at discharge is 123/79.  4. Depression, on Wellbutrin.  5. Obesity.  6. Urinary tract infection, adequately treated on this hospitalization.   DISCHARGE MEDICATIONS:  1. Wellbutrin.  Patient to resume home dose.  According to her, she takes      Wellbutrin 2 in the a.m. and 1 in the p.m.  2. Prednisone 40 mg daily.  3. Enalapril 20 mg b.i.d.  4. Note:  Patient is not to take HCTZ.  5. Note:  Patient is not to take azathioprine.   DISPOSITION:  Being discharged home in stable condition.  Asked to follow up  with Dr. Benson Norway in approximately 1-2 weeks.  Asked to follow up with primary  MD in 1-2 weeks.   CONSULTANT:  Dr. Benson Norway of GI.   STUDIES:  CT showed mild pancreatitis, mild thickening of the terminal ileum  consistent with known Crohn's.  BMET significant for hypokalemia with K of  2.4, lipase 718 on admission, discharge lipase 164.  UA abnormal, consistent  with UTI.   HISTORY OF PRESENT ILLNESS:  A 55 year old female with above medical  problems presented to the ED with nausea, vomiting and diarrhea and  abdominal discomfort.  In the ED, the patient was evaluated.  Patient was  found to have significant leukocytosis of 18,000 and abnormal UA  consistent  with UTI, and a CT consistent with pancreatitis.  Admission was deemed  necessary for further evaluation and treatment.   PAST MEDICAL HISTORY:  1. Significant for Crohn disease.  2. Hypertension.  3. Depression.   PAST SURGICAL HISTORY:  Significant for partial colectomy.   ALLERGIES:  NO KNOWN DRUG ALLERGIES.   SOCIAL HISTORY:  Positive for tobacco.  No alcohol, no drugs.   HOSPITAL COURSE:  The patient was admitted to an emergency floor bed for  evaluation and treatment for nausea, vomiting, diarrhea, and pancreatitis  which was felt to be secondary to azathioprine.  Patient was made n.p.o.,  given judicious IV fluids, started empirically on antibiotics, Cipro and  Flagyl.  Patient was seen in consultation by GI, Dr. Benson Norway.  Patient was  continued on IV Solu-Medrol.  Patient's hospital course is one with slow but  continued improvement.  P.o. diet was restarted which she tolerated well.  Patient did complain of some loose stool.  Had cultures sent for C. diff,  which was negative.  On September 21st, patient was back to baseline without  diarrhea, tolerating fluid p.o.  Has been cleared for discharge by medicine  and GI.  Patient will continue on p.o. prednisone 40 mg daily and follow up  with Dr. Benson Norway on an outpatient basis.      Ashby Dawes. Polite, M.D.  Electronically Signed     RDP/MEDQ  D:  02/01/2006  T:  02/02/2006  Job:  [id number]   cc:   Tory Emerald. Benson Norway, MD  Dr.  [facility] [place]

## 2010-09-29 NOTE — H&P (Signed)
NAME:  April May, April May NO.:  [id number]   MEDICAL RECORD NO.:  [id number]          PATIENT TYPE:  EMS   LOCATION:  ED                           FACILITY:  [facility]   PHYSICIAN:  Girard Cooter, MD         DATE OF BIRTH:  03/27/1956   DATE OF ADMISSION:  01/29/2006  DATE OF DISCHARGE:                                HISTORY & PHYSICAL   The patient is a 55 year old female with a past medical history significant  for Crohn's disease, hypertension, and depression.  The patient comes to the  emergency room  with four days of nausea and vomiting that has been  intractable.  The nausea and vomiting started on Sunday.  The patient could  not tolerate any p.o. intake.  She also had some lower abdominal pain and  bloating.  She just could not tolerate any food.  She called her primary  care doctor at that time and was advised to follow up with a  gastroenterologist.  She tried to call the gastroenterologist, but there was  no answer.  At that time, she denied any blood in the stool.  She did say  that at one point her stools were dark.  The stools were diarrheal in  nature.  There was no blood in the vomitus.  She denies any recent weight  loss.  She said that she felt feverish and there were some chills.  She  denied any headache or dizziness.  No visual changes.  No loss of  consciousness.  No numbness or tingling.  No cough, hemoptysis, chest pain,  or shortness of breath.  She had some lower abdominal pain that was colicky  in nature.  She denies any jaundice or changes in skin color.  She denies  any blood in the stool or changes of menstruation.   PAST MEDICAL HISTORY:  Significant for:  1. Crohn's disease since the age of 59.  She says that several years after      she was diagnosed, she actually had a partial colectomy that resolved      her symptoms for many years until 2001 at which point she had another      flare up and she was advised to start taking Lescol which she  started      but then she stopped and she has felt better now.  She was asymptomatic      until one month ago at which time she saw Dr. Benson Norway, who is a      gastroenterologist, and Dr. Benson Norway put her on prednisone and      azathioprine.  Since that time, she has been feeling better until this      episode.  2. She also has hypertension.  She takes hydrochlorothiazide for that.  3. She also has depression.  She is on Wellbutrin.   CURRENT MEDICATIONS:  Wellbutrin 150 mg p.o. daily, prednisone 5 mg daily,  Enalapril 20 mg twice a day, azathioprine 50 mg twice a day,  hydrochlorothiazide 12.5 mg once a day.   PAST SURGICAL HISTORY:  As noted above.  ALLERGIES:  No known drug allergies.   SOCIAL HISTORY:  She is a smoker, she smokes about a pack a day.  She denies  alcohol or recreational drugs.   REVIEW OF SYSTEMS:  As noted above.  She had some fevers, some chills, some  malaise, some sweats.  No weight loss, no headaches, no dizziness.   PHYSICAL EXAMINATION:  VITAL SIGNS:  Blood pressure 120/76, pulse 77, respirations 16, pulse ox 96%  on room air.  GENERAL:  The patient is in no acute distress.  HEENT:  PERRLA, extraocular movements intact, sclerae clear, no nystagmus,  posterior pharynx clear, oral mucosa without lesions.  NECK:  Supple, the thyroid was not enlarged, no lymphadenopathy, no JVD, no  carotid bruits appreciated.  CHEST:  Clear to auscultation bilaterally.  No wheezes.  HEART:  S1 and S2, regular rate and rhythm, no murmurs, rubs, or clicks.  ABDOMEN:  Soft, nontender, nondistended, positive bowel sounds.  RECTAL:  Good tone, no masses, guaiac negative.  EXTREMITIES:  No cyanosis, clubbing, and edema.  Pulses were 2+ bilaterally.  NEUROLOGICAL:  Alert and oriented x3.  Cranial nerves 2-12 grossly intact.  Deep tendon reflexes 2+.  Motor 5/5 in all extremities.   LABORATORY DATA:  White blood cell count 18.2, hemoglobin 15.2, hematocrit  43.6, platelets 276,  neutrophils 87%, lymphocytes 6%.  Urinalysis was  essentially negative.  Basic metabolic panel with sodium 139, potassium 2.8,  chloride 96, CO2 33, glucose 95, BUN 18, creatinine 1.1.  Calcium 9.2.  Lipase 718.  CAT scan of the abdomen was done which shows no abnormalities  but mild pancreatitis.  Also, abdominal x-ray was done which showed a few  non-dilated air filled loops of small bowel in the right mid abdomen with a  few scattered air fluid levels, nonspecific, no definite evidence of small  bowel obstruction or perforation.   In the emergency room, the patient was given 1000 mL of normal saline by  bolus.  In addition, IV ciprofloxacin was started at a dose of 400 mg IV  drip.  In addition to that, the patient was given Zofran as needed for  nausea and maintenance fluids of 200 mL an hour was also started.   ASSESSMENT/PLAN:  The patient is a 55 year old female with past medical  history significant for Crohn's disease presents here with acute mild  pancreatitis.  Her lipase is 718.  CT scan of the abdomen shows mild  pancreatitis.  Pancreatitis could be secondary to azathioprine which she  started about a month ago, but we will rule out other causes including  stones which I do not think she has or other etiologies including infectious  which is unlikely.  We will go ahead and admit her.  We will keep her n.p.o.  We will continue Fluoroquinolone, antibiotics.  I will go ahead and continue  her with Levaquin 500 mg IV daily and Metronidazole.  In addition, we will  continue her with IV Solu-Medrol.  We will continue hydration.  For  pancreatitis, we will test her LDH and make sure that LFTs get tested and  triglycerides.  We will have to get a GI consult at some point to evaluate  to see if azathioprine could be the etiology of her pancreatitis.  We will  do strict inputs and outputs, IV fluid, and also Zofran for nausea and vomiting.  I will make sure that we check for  electrolyte abnormalities and  replete the electrolytes as needed.  Girard Cooter, MD  Electronically Signed     RR/MEDQ  D:  01/29/2006  T:  01/29/2006  Job:  [id number]

## 2010-09-29 NOTE — Assessment & Plan Note (Signed)
[facility]                                   ON-CALL NOTE   NAME:PRITCHETTShakeira, April May                    MRN:          [id number]  DATE:02/09/2006                            DOB:          01-28-1956    She is not a [facility] patient.  She is a patient of Dr. Carol Ada.   The patient is out of her prednisone which was prescribed by Dr. Benson Norway.  She  takes it for Crohn disease.  I called in #50, 5-mg tablets.  She is  currently on a taper as prescribed by Dr. Benson Norway, which she is to take 8 a day  for the time being, and she will contact his office on October 1 for plans  for followup, and further tapering.  This was prescribed when she was  hospitalized recently.       Gatha Mayer, MD,FACG      CEG/MedQ  DD:  02/09/2006  DT:  02/11/2006  Job #:  [id number]   cc:   Tory Emerald. Benson Norway, MD

## 2010-10-17 ENCOUNTER — Telehealth: Payer: Self-pay

## 2010-10-17 MED ORDER — LISINOPRIL-HYDROCHLOROTHIAZIDE 20-25 MG PO TABS: 1.0000 | ORAL_TABLET | Freq: Every day | ORAL | Status: DC

## 2010-10-25 NOTE — Telephone Encounter (Signed)
closing

## 2010-11-02 ENCOUNTER — Ambulatory Visit: Payer: Medicare Other | Admitting: Internal Medicine

## 2010-12-21 ENCOUNTER — Emergency Department (HOSPITAL_COMMUNITY)
Admission: EM | Admit: 2010-12-21 | Discharge: 2010-12-22 | Disposition: A | Discharge status: Home / Self Care | Payer: Medicare Other | Attending: Emergency Medicine | Admitting: Emergency Medicine

## 2010-12-21 DIAGNOSIS — K5090 Crohn's disease, unspecified, without complications: Secondary | ICD-10-CM | POA: Insufficient documentation

## 2010-12-21 DIAGNOSIS — F3130 Bipolar disorder, current episode depressed, mild or moderate severity, unspecified: Secondary | ICD-10-CM | POA: Insufficient documentation

## 2010-12-21 DIAGNOSIS — R300 Dysuria: Secondary | ICD-10-CM | POA: Insufficient documentation

## 2010-12-22 ENCOUNTER — Encounter (HOSPITAL_COMMUNITY): Payer: Self-pay

## 2010-12-22 ENCOUNTER — Emergency Department (HOSPITAL_COMMUNITY): Payer: Medicare Other

## 2010-12-22 LAB — COMPREHENSIVE METABOLIC PANEL
AST: 20 U/L (ref 0–37)
Albumin: 3.9 g/dL (ref 3.5–5.2)
BUN: 14 mg/dL (ref 6–23)
Calcium: 9.8 mg/dL (ref 8.4–10.5)
Creatinine, Ser: 1.09 mg/dL (ref 0.50–1.10)
Total Protein: 7.8 g/dL (ref 6.0–8.3)

## 2010-12-22 LAB — URINALYSIS, ROUTINE W REFLEX MICROSCOPIC
Glucose, UA: NEGATIVE mg/dL
Ketones, ur: NEGATIVE mg/dL
Leukocytes, UA: NEGATIVE
Protein, ur: NEGATIVE mg/dL

## 2010-12-22 LAB — DIFFERENTIAL
Eosinophils Absolute: 0.3 10*3/uL (ref 0.0–0.7)
Eosinophils Relative: 2 % (ref 0–5)
Lymphs Abs: 4.4 10*3/uL — ABNORMAL HIGH (ref 0.7–4.0)
Monocytes Absolute: 1.3 10*3/uL — ABNORMAL HIGH (ref 0.1–1.0)
Monocytes Relative: 8 % (ref 3–12)

## 2010-12-22 LAB — CBC
MCH: 30.5 pg (ref 26.0–34.0)
MCHC: 33.6 g/dL (ref 30.0–36.0)
MCV: 91 fL (ref 78.0–100.0)
Platelets: 340 10*3/uL (ref 150–400)
RDW: 14 % (ref 11.5–15.5)
WBC: 16.7 10*3/uL — ABNORMAL HIGH (ref 4.0–10.5)

## 2010-12-22 LAB — LIPASE, BLOOD: Lipase: 32 U/L (ref 11–59)

## 2010-12-22 MED ORDER — IOHEXOL 300 MG/ML  SOLN
100.0000 mL | Freq: Once | INTRAMUSCULAR | Status: AC | PRN
  Administered 2010-12-22: 100 mL via INTRAVENOUS

## 2010-12-23 LAB — URINE CULTURE: Colony Count: 7000

## 2011-02-13 ENCOUNTER — Emergency Department (HOSPITAL_COMMUNITY): Payer: Medicare Other

## 2011-02-13 ENCOUNTER — Inpatient Hospital Stay (HOSPITAL_COMMUNITY)
Admission: EM | Admit: 2011-02-13 | Discharge: 2011-02-14 | DRG: 387 | Disposition: A | Discharge status: Home / Self Care | Payer: Medicare Other | Attending: Internal Medicine | Admitting: Internal Medicine

## 2011-02-13 DIAGNOSIS — R1031 Right lower quadrant pain: Secondary | ICD-10-CM | POA: Diagnosis present

## 2011-02-13 DIAGNOSIS — K5080 Crohn's disease of both small and large intestine without complications: Principal | ICD-10-CM | POA: Diagnosis present

## 2011-02-13 DIAGNOSIS — F319 Bipolar disorder, unspecified: Secondary | ICD-10-CM | POA: Diagnosis present

## 2011-02-13 DIAGNOSIS — R7309 Other abnormal glucose: Secondary | ICD-10-CM | POA: Diagnosis present

## 2011-02-13 DIAGNOSIS — Z9049 Acquired absence of other specified parts of digestive tract: Secondary | ICD-10-CM

## 2011-02-13 DIAGNOSIS — R112 Nausea with vomiting, unspecified: Secondary | ICD-10-CM | POA: Diagnosis present

## 2011-02-13 DIAGNOSIS — K7689 Other specified diseases of liver: Secondary | ICD-10-CM | POA: Diagnosis present

## 2011-02-13 DIAGNOSIS — I10 Essential (primary) hypertension: Secondary | ICD-10-CM | POA: Diagnosis present

## 2011-02-13 DIAGNOSIS — D72829 Elevated white blood cell count, unspecified: Secondary | ICD-10-CM | POA: Diagnosis present

## 2011-02-13 DIAGNOSIS — E876 Hypokalemia: Secondary | ICD-10-CM | POA: Diagnosis present

## 2011-02-13 LAB — COMPREHENSIVE METABOLIC PANEL
Albumin: 3.7 g/dL (ref 3.5–5.2)
Alkaline Phosphatase: 83 U/L (ref 39–117)
BUN: 14 mg/dL (ref 6–23)
Calcium: 9.7 mg/dL (ref 8.4–10.5)
GFR calc Af Amer: >90 mL/min (ref >90)
Glucose, Bld: 146 mg/dL — ABNORMAL HIGH (ref 70–99)
Potassium: 3.4 mEq/L — ABNORMAL LOW (ref 3.5–5.1)
Total Protein: 7.8 g/dL (ref 6.0–8.3)

## 2011-02-13 LAB — URINALYSIS, ROUTINE W REFLEX MICROSCOPIC
Leukocytes, UA: NEGATIVE
Nitrite: NEGATIVE
Specific Gravity, Urine: 1.015 (ref 1.005–1.030)
Urobilinogen, UA: 0.2 mg/dL (ref 0.0–1.0)
pH: 6 (ref 5.0–8.0)

## 2011-02-13 LAB — DIFFERENTIAL
Basophils Absolute: 0 10*3/uL (ref 0.0–0.1)
Basophils Relative: 0 % (ref 0–1)
Eosinophils Relative: 1 % (ref 0–5)
Lymphocytes Relative: 12 % (ref 12–46)
Monocytes Absolute: 1.5 10*3/uL — ABNORMAL HIGH (ref 0.1–1.0)
Monocytes Relative: 8 % (ref 3–12)

## 2011-02-13 LAB — LIPASE, BLOOD: Lipase: 67 U/L — ABNORMAL HIGH (ref 11–59)

## 2011-02-13 LAB — CBC
HCT: 45.4 % (ref 36.0–46.0)
Hemoglobin: 14.9 g/dL (ref 12.0–15.0)
MCH: 30.7 pg (ref 26.0–34.0)
MCHC: 32.8 g/dL (ref 30.0–36.0)
RDW: 14.3 % (ref 11.5–15.5)

## 2011-02-13 LAB — GLUCOSE, CAPILLARY: Glucose-Capillary: 110 mg/dL — ABNORMAL HIGH (ref 70–99)

## 2011-02-13 LAB — SEDIMENTATION RATE: Sed Rate: 6 mm/hr (ref 0–22)

## 2011-02-13 MED ORDER — IOHEXOL 300 MG/ML  SOLN
100.0000 mL | Freq: Once | INTRAMUSCULAR | Status: AC | PRN
  Administered 2011-02-13: 100 mL via INTRAVENOUS

## 2011-02-14 LAB — CBC
HCT: 40 % (ref 36.0–46.0)
MCHC: 33 g/dL (ref 30.0–36.0)
MCV: 92.6 fL (ref 78.0–100.0)
Platelets: 317 10*3/uL (ref 150–400)
RDW: 14.5 % (ref 11.5–15.5)
WBC: 17.8 10*3/uL — ABNORMAL HIGH (ref 4.0–10.5)

## 2011-02-14 LAB — BASIC METABOLIC PANEL
BUN: 8 mg/dL (ref 6–23)
Calcium: 9.3 mg/dL (ref 8.4–10.5)
Creatinine, Ser: 0.62 mg/dL (ref 0.50–1.10)
GFR calc Af Amer: >90 mL/min (ref >90)
GFR calc non Af Amer: >90 mL/min (ref >90)
Glucose, Bld: 134 mg/dL — ABNORMAL HIGH (ref 70–99)
Potassium: 4.1 mEq/L (ref 3.5–5.1)

## 2011-02-14 LAB — HEMOGLOBIN A1C
Hgb A1c MFr Bld: 6.3 % — ABNORMAL HIGH (ref <5.7)
Mean Plasma Glucose: 134 mg/dL — ABNORMAL HIGH (ref <117)

## 2011-02-14 LAB — DIFFERENTIAL
Basophils Absolute: 0 10*3/uL (ref 0.0–0.1)
Eosinophils Absolute: 0 10*3/uL (ref 0.0–0.7)
Eosinophils Relative: 0 % (ref 0–5)
Lymphocytes Relative: 11 % — ABNORMAL LOW (ref 12–46)
Monocytes Absolute: 0.9 10*3/uL (ref 0.1–1.0)

## 2011-02-14 LAB — GLUCOSE, CAPILLARY

## 2011-02-16 NOTE — Consult Note (Signed)
April May, April May           ACCOUNT NO.:  [id number]  MEDICAL RECORD NO.:  [id number]  LOCATION:  54                         FACILITY:  [facility]  PHYSICIAN:  Tory Emerald. Benson Norway, MD    DATE OF BIRTH:  [birth year]  DATE OF CONSULTATION:  02/14/2011 DATE OF DISCHARGE:                                CONSULTATION   PRIMARY CARE PROVIDER:  Scarlette Calico, MD  REASON FOR CONSULTATION:  Crohn disease.  HISTORY OF PRESENT ILLNESS:  This is a 55 year old female with a past medical history of Crohn disease, status post partial colectomy, bipolar disease, and hypertension; who was admitted to the hospital with complaints of abdominal pain.  The patient was diagnosed with her Crohn disease approximately 4 years ago and she required a right hemicolectomy as there was involvement in the ileal colonic region.  She recently had increase in her abdominal pain and was followed up by me in the office, and at that time she was started on budesonide 9 mg 1 p.o. q. day. Overall, she has been doing quite well with budesonide and so this flare of her pain over the past couple of days.  She started to have an increase in the severity of her abdominal pain and it became more generalized.  As a result, she presented to the emergency room for further evaluation and treatment.  A CT scan was performed and revealed that there was increased inflammation at the ileocolonic region consistent with active Crohn disease.  Subsequently, she was started on prednisone 40-60 mg and also provided with pain medication.  Today, she feels much better and has no further problems with pain or nausea and vomiting.  As a result of her symptoms, a GI consultation was requested.  PAST MEDICAL HISTORY:  As stated above.  PAST SURGICAL HISTORY:  As stated above.  FAMILY HISTORY:  Noncontributory.  SOCIAL HISTORY:  Negative for alcohol or illicit drug use.  Positive for tobacco, one pack per day.  REVIEW OF SYSTEMS:  As  stated above in history of present illness, otherwise negative.  MEDICATIONS:  Aspirin, bupropion, Lovenox, hydrochlorothiazide, Lamictal, lisinopril, nicotine patch, prednisone, Vicodin, Zofran, Ambien.  ALLERGIES:  No known drug allergies.  PHYSICAL EXAMINATION:  VITAL SIGNS:  Blood pressure is 115/69, heart rate is 66, respirations 18, temperature is 98.8.  GENERAL:  The patient is in no acute distress, alert, and oriented.  HEENT:  Normocephalic, atraumatic.  Extraocular muscles intact.  NECK:  Supple.  No lymphadenopathy.  LUNGS:  Clear to auscultation bilaterally. CARDIOVASCULAR:  Regular rate and rhythm.  ABDOMEN:  Obese, soft, nontender, nondistended.  Positive bowel sounds.  EXTREMITIES:  No clubbing, cyanosis, or edema.  LABORATORY DATA:  Laboratory values; white blood cell count 17.8, hemoglobin is 13.2, MCV is 92.6, platelets at 317.  Sodium 139, potassium 4.1, chloride 106, CO2 of 26, glucose 134, BUN 8, creatinine 0.6.  IMPRESSION: 1. Crohn's flare. 2. Abdominal pain.  Clinically, the patient is well at this time.  Her white blood cell count is elevated and certainly inflammation at the ileocolonic anastomosis.  No evidence of any abscess at this time per the CT scan report.  Since she is responding quite well to medications, I think  it would be safe for her to be discharged home, although I will follow up with her in 1 week.  She also knows to call me if there are any changes in her clinical status.  She will continue with steroids at this time and hold the budesonide and she can be treated symptomatically for any kind of pain.  If she does develop fever or worsening pain, I do recommend that she return the report back to the hospital for further evaluation and treatment.  At this point, I do not think any surgical intervention is required at this time, but her clinical issues do warrant, then I keep a close tab on her.     Tory Emerald Benson Norway,  MD     PDH/MEDQ  D:  02/14/2011  T:  02/14/2011  Job:  [id number]  Electronically Signed by Carol Ada MD on 02/16/2011 08:15:48 AM

## 2011-02-20 LAB — BASIC METABOLIC PANEL
CO2: 25
Calcium: 9.5
Creatinine, Ser: 0.76
GFR calc Af Amer: >60
GFR calc non Af Amer: >60
Sodium: 133 — ABNORMAL LOW

## 2011-02-20 LAB — DIFFERENTIAL
Lymphocytes Relative: 19
Lymphs Abs: 3.2
Monocytes Absolute: 0.6
Monocytes Relative: 3
Neutro Abs: 13.1 — ABNORMAL HIGH
Neutrophils Relative %: 76

## 2011-02-20 LAB — URINALYSIS, ROUTINE W REFLEX MICROSCOPIC
Bilirubin Urine: NEGATIVE
Hgb urine dipstick: NEGATIVE
Ketones, ur: NEGATIVE
Nitrite: NEGATIVE
pH: 5.5

## 2011-02-20 LAB — URINE CULTURE: Colony Count: 6000

## 2011-02-20 LAB — CBC
Hemoglobin: 15.3 — ABNORMAL HIGH
MCHC: 34.1
RBC: 4.91
WBC: 17.1 — ABNORMAL HIGH

## 2011-02-20 LAB — PREGNANCY, URINE: Preg Test, Ur: NEGATIVE

## 2011-02-21 NOTE — Discharge Summary (Signed)
[name], [name]           ACCOUNT NO.:  [id number]  MEDICAL RECORD NO.:  [id number]  LOCATION:  1320                         FACILITY:  [facility]  PHYSICIAN:  Baltazar Najjar, MD     DATE OF BIRTH:  April 25, 1956  DATE OF ADMISSION:  02/13/2011 DATE OF DISCHARGE:                              DISCHARGE SUMMARY   FINAL DISCHARGE DIAGNOSES: 1. Crohn disease flare. 2. Mild hypokalemia, repleted. 3. Hyperglycemia. 4. History of hypertension. 5. History of depression/bipolar.  CONSULTATIONS DURING THIS HOSPITALIZATION:  Dr. Jeani Hawking from GI.  RADIOLOGY/IMAGING STUDY: 1. CT abdomen and pelvis showed increase stranding around the new     terminal ileum with prominent inflammation of the distal 7 cm     segment and probable anterior ulceration and with stool like     density in the adjacent segment of the distal ileum.  Surrounding     edema density is present without drainable abscess at this time.     The appearance is compatible with active Crohn disease.  Stable     osteitis pubis and finding of bilateral sacroiliitis.  Diffuse     hepatic steatosis, mild.  Small hypodense hepatic lesions     technically nonspecific, although stable unlikely to represent     benign incidental lesion. 2. Chest x-ray, no active disease.  BRIEF ADMITTING HISTORY:  Please refer to H and P dictated yesterday for more details on summary.  Ms. April May is a 55 year old African American woman with history of Crohn disease, presented to the ER with chief complaint of abdominal pain of 1-day duration prior to admission located in the right lower quadrant associated with nausea and vomiting. There was no diarrhea.  Please refer to H and P for more details.  HOSPITAL COURSE: 1. The patient was admitted with a diagnosis of acute Crohn disease     flare.  She was placed on clear liquid diet and hydrated with IV     fluids and started on prednisone p.o.  GI was consulted.  The     patient was  seen by Dr. Elnoria Howard from GI who recommended to discharge     her home on prednisone taper.  The patient was on clear liquid diet     and we are advancing her diet to soft mechanical prior to     discharge.  I advised her to stay on soft mechanical diet for     couple of days and gradually increase her diet.  I also advised her     to report to the ER for any development of worsening of abdominal     pain or fever or chills. 2. Leukocytosis most likely secondary to Crohn flare.  A CT scan of     the abdomen and pelvis did not show any evidence of abscess at this     time.  The patient to continue prednisone and follow with Dr. Elnoria Howard     in 1 week. 3. Hypokalemia that was repleted. 4. Nausea and vomiting, resolved. 5. Incidental finding of hepatic lesions, thought to be benign.  The     patient to follow with her PCP for followup on that. 6. Hypertension, controlled.  Continue  with lisinopril/HCTZ and follow     with PCP. 7. Hyperglycemia.  The patient does not carry a history of diabetes.     Her hemoglobin A1c was checked.  It was 6.3% and with mean plasma     glucose level of 134.  The patient advised to stay on low-     carbohydrate diet and follow with her PCP.  DISCHARGE MEDICATIONS: 1. Prednisone taper, dose was discussed with Dr. Elnoria Howard.  She will be     discharged on 40 mg daily for 2 weeks, then 20 mg daily for 2     weeks, then 10 mg daily for 2 weeks, then 5 mg daily for 2 weeks. 2. Lamotrigine 200 mg p.o. daily. 3. Lisinopril/HCTZ 20/25 mg 1 tablet p.o. daily. 4. Wellbutrin 100 mg, one-half tablet p.o. twice daily. 5. Zoloft 100 mg 1 tablet p.o. twice daily.  DISCHARGE INSTRUCTIONS: 1. The patient to continue above medications as prescribed. 2. The patient to follow up with Dr. Elnoria Howard in the office in 1 week.  As     he recommended, his office will call her with an appointment. 3. The patient to report any worsening of symptoms or any new symptoms     such as worsening  abdominal pain, nausea, vomiting, or fever to her     PCP or come to the ER if needed. 4. The patient to follow with her PCP within 1 week of discharge. 5. Defer to PCP followup on her incidental hepatic lesions and     hyperglycemia with elevated hemoglobin A1c. 6. The patient to continue with soft mechanical diet for couple of     days and gradually advance diet.  Also, advised to stay on low-     carbohydrate diet.  FOLLOWUP: 1. With Dr. Jeani Hawking in 1 week. 2. Follow with PCP ASAP preferably within 1 week.  CONDITION ON DISCHARGE:  Improved.          ______________________________ Baltazar Najjar, MD     SA/MEDQ  D:  02/14/2011  T:  02/14/2011  Job:  [id number]  cc:   Sanda Linger, MD [address] [place] [address] [place] [address]  Jordan Hawks. Elnoria Howard, MD Fax: [phone]  Electronically Signed by Hannah Beat MD on 02/21/2011 10:01:16 PM

## 2011-02-22 ENCOUNTER — Ambulatory Visit: Payer: Medicare Other | Admitting: Internal Medicine

## 2011-03-01 ENCOUNTER — Encounter: Payer: Self-pay | Admitting: Internal Medicine

## 2011-03-01 ENCOUNTER — Other Ambulatory Visit (INDEPENDENT_AMBULATORY_CARE_PROVIDER_SITE_OTHER): Payer: Medicare Other

## 2011-03-01 ENCOUNTER — Ambulatory Visit (INDEPENDENT_AMBULATORY_CARE_PROVIDER_SITE_OTHER): Payer: Medicare Other | Admitting: Internal Medicine

## 2011-03-01 VITALS — BP 110/70 | HR 72 | Temp 98.2°F | Resp 16 | Wt 241.0 lb

## 2011-03-01 DIAGNOSIS — IMO0001 Reserved for inherently not codable concepts without codable children: Secondary | ICD-10-CM

## 2011-03-01 DIAGNOSIS — I10 Essential (primary) hypertension: Secondary | ICD-10-CM

## 2011-03-01 DIAGNOSIS — R7303 Prediabetes: Secondary | ICD-10-CM | POA: Insufficient documentation

## 2011-03-01 DIAGNOSIS — K5090 Crohn's disease, unspecified, without complications: Secondary | ICD-10-CM

## 2011-03-01 LAB — BASIC METABOLIC PANEL
BUN: 18 mg/dL (ref 6–23)
CO2: 31 mEq/L (ref 19–32)
Chloride: 103 mEq/L (ref 96–112)
Creatinine, Ser: 0.8 mg/dL (ref 0.4–1.2)
Glucose, Bld: 83 mg/dL (ref 70–99)
Potassium: 3.2 mEq/L — ABNORMAL LOW (ref 3.5–5.1)

## 2011-03-01 NOTE — Assessment & Plan Note (Signed)
She recently had a flare that required admission, she is doing really well on prednisone now

## 2011-03-01 NOTE — Progress Notes (Signed)
  Subjective:    Patient ID: April May, female    DOB: 10/06/1955, 55 y.o.   MRN: [id number]  Hypertension This is a chronic problem. The current episode started more than 1 year ago. The problem has been gradually improving since onset. The problem is controlled. Pertinent negatives include no anxiety, blurred vision, chest pain, headaches, malaise/fatigue, neck pain, orthopnea, palpitations, peripheral edema, PND, shortness of breath or sweats. Past treatments include ACE inhibitors and diuretics. The current treatment provides significant improvement. Compliance problems include exercise and diet.       Review of Systems  Constitutional: Negative.  Negative for malaise/fatigue.  HENT: Negative.  Negative for neck pain.   Eyes: Negative.  Negative for blurred vision.  Respiratory: Negative for apnea, cough, choking, chest tightness, shortness of breath, wheezing and stridor.   Cardiovascular: Negative for chest pain, palpitations, orthopnea, leg swelling and PND.  Gastrointestinal: Negative for nausea, vomiting, abdominal pain, diarrhea, constipation, blood in stool, abdominal distention, anal bleeding and rectal pain.  Genitourinary: Negative.   Musculoskeletal: Negative.   Skin: Negative.   Neurological: Negative.  Negative for headaches.  Hematological: Negative.   Psychiatric/Behavioral: Negative.        Objective:   Physical Exam  Vitals reviewed. Constitutional: She is oriented to person, place, and time. She appears well-developed and well-nourished. No distress.  HENT:  Head: Normocephalic and atraumatic.  Mouth/Throat: Oropharynx is clear and moist. No oropharyngeal exudate.  Eyes: Conjunctivae are normal. Right eye exhibits no discharge. Left eye exhibits no discharge. No scleral icterus.  Neck: Normal range of motion. Neck supple. No JVD present. No tracheal deviation present. No thyromegaly present.  Cardiovascular: Normal rate, regular rhythm, normal heart  sounds and intact distal pulses.  Exam reveals no gallop and no friction rub.   No murmur heard. Pulmonary/Chest: Effort normal and breath sounds normal. No stridor. No respiratory distress. She has no wheezes. She has no rales. She exhibits no tenderness.  Abdominal: Soft. Bowel sounds are normal. She exhibits no distension and no mass. There is no tenderness. There is no rebound and no guarding.  Musculoskeletal: Normal range of motion. She exhibits no edema and no tenderness.  Lymphadenopathy:    She has no cervical adenopathy.  Neurological: She is oriented to person, place, and time. She displays normal reflexes. She exhibits normal muscle tone.  Skin: Skin is warm and dry. No rash noted. She is not diaphoretic. No erythema. No pallor.  Psychiatric: She has a normal mood and affect. Her behavior is normal. Judgment and thought content normal.      Lab Results  Component Value Date   WBC 17.8* 02/14/2011   HGB 13.2 02/14/2011   HCT 40.0 02/14/2011   PLT 317 02/14/2011   GLUCOSE 134* 02/14/2011   CHOL 160 01/30/2010   TRIG 103 01/30/2010   HDL 38* 01/30/2010   LDLCALC 101* 01/30/2010   ALT 22 02/13/2011   AST 19 02/13/2011   NA 139 02/14/2011   K 4.1 02/14/2011   CL 106 02/14/2011   CREATININE 0.62 02/14/2011   BUN 8 02/14/2011   CO2 26 02/14/2011   TSH 1.53 08/16/2010   HGBA1C 6.3* 02/14/2011   MICROALBUR 0.50 01/30/2010      Assessment & Plan:

## 2011-03-01 NOTE — Patient Instructions (Signed)

## 2011-03-01 NOTE — Assessment & Plan Note (Signed)
Her BP is well controlled, she was treated for hypokalemia in the hospital so I will check her lytes today

## 2011-03-01 NOTE — Assessment & Plan Note (Signed)
This is a new diagnosis based on her recent A1C

## 2011-03-02 ENCOUNTER — Encounter: Payer: Self-pay | Admitting: Internal Medicine

## 2011-03-11 NOTE — H&P (Signed)
NAME:  April May, KUTSCHER NO.:  [id number]  MEDICAL RECORD NO.:  [id number]  LOCATION:  [facility]                         FACILITY:  [facility]  PHYSICIAN:  Jonny Ruiz, MD    DATE OF BIRTH:  09-16-1955  DATE OF ADMISSION:  02/13/2011 DATE OF DISCHARGE:                             HISTORY & PHYSICAL   PRIMARY CARE PHYSICIAN:  Dr. Sanda Linger, Internal Medicine.  GASTROENTEROLOGY:  Dr. Jeani Hawking.  CHIEF COMPLAINT:  Abdominal pain.  HISTORY OF PRESENT ILLNESS:  The patient is a 55 year old female with a history of Crohn disease diagnosed 4 years ago, presenting to the ER with 1-day of right lower quadrant abdominal pain associated with nausea and vomiting.  The patient denies diarrhea.  In fact she feels constipated.  The patient states that she has been on remission for the last 2 months, but feels that this is a flare.  She has not had a bowel movement in the last 3 days.  She has no diarrhea.  She states that in the past, her flares are preceded by constipation and then she developed diarrhea.  She denies fever or chills.  The patient has been tried on budesonide as a new medication lately and states that it is not working for her.  The patient wants to eat at this time, feels like starving.  PAST MEDICAL HISTORY: 1. Crohn disease diagnosed at the age of 52 or 20, last colonoscopy     last year. 2. Bipolar depression. 3. Essential hypertension.  SURGICAL HISTORY:  Appendectomy, bowel resection, hysterectomy, lumpectomy, and oophorectomy.  CURRENT MEDICATIONS: 1. Entocort EC. 2. Lamictal 200 mg once a day. 3. Lisinopril/hydrochlorothiazide 20/12.5 mg once a day. 4. Wellbutrin 150 mg p.o. b.i.d. 5. Zoloft 100 mg once a day.  FAMILY HISTORY:  Significant for hypertension.  SOCIAL HISTORY:  The patient has been on disability due to bipolar depression and Crohn disease.  The patient lives with herself.  She smokes 1-pack per day and denies tobacco or  illicits.  REVIEW OF SYSTEMS:  CONSTITUTIONAL:  She denies fever, chills, or night sweats.  She denies weight loss.  CARDIOVASCULAR:  Denies chest pain, shortness of breath, palpitations, or edema.  RESPIRATORY:  Denies cough or shortness of breath.  GASTROINTESTINAL:  See HPI.  GENITOURINARY: Denies dysuria, frequency, or hematuria.  MUSCULOSKELETAL:  Denies arthralgia or joint swelling.  DERMATOLOGIC:  Denies skin rash.  PHYSICAL EXAMINATION:  VITAL SIGNS:  Her blood pressure is 102/57, pulse 65, respirations 16, temperature 98.8, and O2 saturation 96%. GENERAL APPEARANCE:  The patient is an overweight African American female who appears comfortable.  She was pleasant and cooperative during the exam. HEENT:  Unremarkable. NECK:  Supple without JVD. HEART:  Regular S1 and S2 without gallops, murmurs, or rubs. LUNGS:  Clear to auscultation without rhonchi, rales, or wheezes. ABDOMEN:  Soft and tender on the right lower quadrant without guarding or rebounding tenderness.  No organomegaly or masses palpable. EXTREMITIES:  With no clubbing, cyanosis, or edema. NEUROLOGICAL:  Nonfocal.  1. CT scan of the abdomen showed 1 increased stranding around the     neoterminal ileum with prominent inflammation of the distal 7 cm     segment and  probable anterior ulceration with stool like density in     the adjacent segment of the distal ileum.  Surrounding edema,     density is present without a drainable abscess at this time,     compatible with active Crohn disease. 2. Diffuse hepatic steatosis, mild.  Chest x-ray was negative.  UA was     negative.  Acute abdominal series, minimally distended small bowel     loops in mid abdomen with some air-fluid levels suspicious for     ileus or early bowel obstruction.  The comprehensive metabolic panel was significant for a potassium of 3.4 and a glucose of 146.  CBC, white count 17.8, hemoglobin 14.9, hematocrit 45.4, MCV 93.4, and platelet count  317.  IMPRESSION:  Active Crohn disease.  PLAN: 1. Admit the patient to the hospital, IV fluids and normal saline at     125 cc an hour.  Start prednisone 20 mg p.o. t.i.d.  GI consult. 2. Bipolar depression.  Continue Zoloft, Wellbutrin, and Lamictal at     current doses. 3. Essential hypertension.  We will decrease her dose of lisinopril to     20 mg a day and continue on     hydrochlorothiazide 12.5 mg a day. 4. Hypokalemia.  Replace with K-Dur 40 mEq p.o. x2 doses. 5. Hyperglycemia.  This was a nonfasting sample.  We will check CBG     b.i.d. while she is on the steroids.          ______________________________ Jonny Ruiz, MD     GL/MEDQ  D:  02/13/2011  T:  02/13/2011  Job:  [id number]  cc:   Sanda Linger, MD [address] [place] [address]  Jordan Hawks. Elnoria Howard, MD Fax: [phone]  Electronically Signed by Jonny Ruiz MD on 03/11/2011 09:45:22 PM

## 2011-03-14 ENCOUNTER — Other Ambulatory Visit: Payer: Self-pay | Admitting: Internal Medicine

## 2011-03-14 DIAGNOSIS — Z1231 Encounter for screening mammogram for malignant neoplasm of breast: Secondary | ICD-10-CM

## 2011-03-15 ENCOUNTER — Ambulatory Visit (HOSPITAL_COMMUNITY)
Admission: RE | Admit: 2011-03-15 | Discharge: 2011-03-15 | Disposition: A | Discharge status: Home / Self Care | Payer: Medicare Other | Source: Ambulatory Visit | Attending: Internal Medicine | Admitting: Internal Medicine

## 2011-03-15 DIAGNOSIS — Z1231 Encounter for screening mammogram for malignant neoplasm of breast: Secondary | ICD-10-CM

## 2011-03-15 LAB — HM MAMMOGRAPHY: HM Mammogram: NORMAL

## 2011-03-28 ENCOUNTER — Ambulatory Visit (INDEPENDENT_AMBULATORY_CARE_PROVIDER_SITE_OTHER): Payer: Medicare Other | Admitting: Internal Medicine

## 2011-03-28 ENCOUNTER — Other Ambulatory Visit (INDEPENDENT_AMBULATORY_CARE_PROVIDER_SITE_OTHER): Payer: Medicare Other

## 2011-03-28 ENCOUNTER — Encounter: Payer: Self-pay | Admitting: Internal Medicine

## 2011-03-28 ENCOUNTER — Other Ambulatory Visit: Payer: Self-pay | Admitting: Gastroenterology

## 2011-03-28 DIAGNOSIS — M7989 Other specified soft tissue disorders: Secondary | ICD-10-CM

## 2011-03-28 DIAGNOSIS — E669 Obesity, unspecified: Secondary | ICD-10-CM

## 2011-03-28 DIAGNOSIS — E876 Hypokalemia: Secondary | ICD-10-CM

## 2011-03-28 DIAGNOSIS — I10 Essential (primary) hypertension: Secondary | ICD-10-CM

## 2011-03-28 DIAGNOSIS — IMO0001 Reserved for inherently not codable concepts without codable children: Secondary | ICD-10-CM

## 2011-03-28 LAB — CBC WITH DIFFERENTIAL/PLATELET
Eosinophils Relative: 0.8 % (ref 0.0–5.0)
HCT: 43.2 % (ref 36.0–46.0)
Hemoglobin: 14.2 g/dL (ref 12.0–15.0)
Lymphs Abs: 4.7 10*3/uL — ABNORMAL HIGH (ref 0.7–4.0)
MCV: 94.7 fl (ref 78.0–100.0)
Monocytes Absolute: 1.5 10*3/uL — ABNORMAL HIGH (ref 0.1–1.0)
Monocytes Relative: 10.3 % (ref 3.0–12.0)
Neutro Abs: 8 10*3/uL — ABNORMAL HIGH (ref 1.4–7.7)
Platelets: 326 10*3/uL (ref 150.0–400.0)
RDW: 15 % — ABNORMAL HIGH (ref 11.5–14.6)

## 2011-03-28 LAB — BASIC METABOLIC PANEL
CO2: 30 mEq/L (ref 19–32)
Calcium: 9.5 mg/dL (ref 8.4–10.5)
Chloride: 104 mEq/L (ref 96–112)
Creatinine, Ser: 0.9 mg/dL (ref 0.4–1.2)
Glucose, Bld: 113 mg/dL — ABNORMAL HIGH (ref 70–99)

## 2011-03-28 LAB — HEMOGLOBIN A1C: Hgb A1c MFr Bld: 6.7 % — ABNORMAL HIGH (ref 4.6–6.5)

## 2011-03-28 MED ORDER — POTASSIUM CHLORIDE 8 MEQ PO TBCR: 8.0000 meq | EXTENDED_RELEASE_TABLET | Freq: Three times a day (TID) | ORAL | Status: DC

## 2011-03-28 MED ORDER — PHENTERMINE HCL 37.5 MG PO TABS: 37.5000 mg | ORAL_TABLET | Freq: Every day | ORAL | Status: DC

## 2011-03-28 NOTE — Assessment & Plan Note (Signed)
She wants to try a diet pill for weight loss so I gave her an Rx for Adipex-p, she will keep a close eye on her BP and her mood (looking for insomnia, anxiety), she will return in one month for a recheck on her BP

## 2011-03-28 NOTE — Assessment & Plan Note (Addendum)
Her BP is well controlled, I will check her renal function today

## 2011-03-28 NOTE — Patient Instructions (Signed)

## 2011-03-28 NOTE — Assessment & Plan Note (Signed)
I will recheck her a1c

## 2011-03-28 NOTE — Progress Notes (Signed)
  Subjective:    Patient ID: April May, female    DOB: 1955/06/23, 55 y.o.   MRN: [id number]  Hypertension This is a chronic problem. The current episode started more than 1 year ago. The problem has been gradually improving since onset. The problem is controlled. Pertinent negatives include no anxiety, blurred vision, chest pain, headaches, malaise/fatigue, neck pain, orthopnea, palpitations, peripheral edema, PND, shortness of breath or sweats. There are no associated agents to hypertension. Past treatments include ACE inhibitors. The current treatment provides moderate improvement. Compliance problems include exercise and diet.       Review of Systems  Constitutional: Positive for unexpected weight change (weight gain). Negative for fever, chills, malaise/fatigue, diaphoresis, activity change, appetite change and fatigue.  HENT: Negative for sore throat, facial swelling, trouble swallowing, neck pain and voice change.   Eyes: Negative.  Negative for blurred vision.  Respiratory: Negative for cough, shortness of breath, wheezing and stridor.   Cardiovascular: Negative for chest pain, palpitations, orthopnea, leg swelling and PND.  Gastrointestinal: Negative for nausea, vomiting, abdominal pain and diarrhea.  Genitourinary: Negative for dysuria, urgency, frequency, hematuria, flank pain, decreased urine volume, enuresis, difficulty urinating and dyspareunia.  Neurological: Negative for dizziness, tremors, seizures, syncope, facial asymmetry, speech difficulty, weakness, light-headedness, numbness and headaches.  Hematological: Negative for adenopathy. Does not bruise/bleed easily.  Psychiatric/Behavioral: Negative.        Objective:   Physical Exam  Vitals reviewed. Constitutional: She is oriented to person, place, and time. She appears well-developed and well-nourished. No distress.  HENT:  Head: Normocephalic and atraumatic.  Mouth/Throat: Oropharynx is clear and moist. No  oropharyngeal exudate.  Eyes: Conjunctivae are normal. Right eye exhibits no discharge. Left eye exhibits no discharge. No scleral icterus.  Neck: Normal range of motion. Neck supple. No JVD present. No tracheal deviation present. No thyromegaly present.  Cardiovascular: Normal rate, regular rhythm, normal heart sounds and intact distal pulses.  Exam reveals no gallop and no friction rub.   No murmur heard. Pulmonary/Chest: Effort normal and breath sounds normal. No stridor. No respiratory distress. She has no wheezes. She has no rales. She exhibits no tenderness.  Abdominal: Soft. Bowel sounds are normal. She exhibits no distension and no mass. There is no tenderness. There is no rebound and no guarding.  Musculoskeletal: Normal range of motion. She exhibits no edema and no tenderness.  Lymphadenopathy:    She has no cervical adenopathy.  Neurological: She is oriented to person, place, and time.  Skin: Skin is warm and dry. No rash noted. She is not diaphoretic. No erythema. No pallor.  Psychiatric: She has a normal mood and affect. Her behavior is normal. Judgment and thought content normal.     Lab Results  Component Value Date   WBC 17.8* 02/14/2011   HGB 13.2 02/14/2011   HCT 40.0 02/14/2011   PLT 317 02/14/2011   GLUCOSE 83 03/01/2011   CHOL 160 01/30/2010   TRIG 103 01/30/2010   HDL 38* 01/30/2010   LDLCALC 101* 01/30/2010   ALT 22 02/13/2011   AST 19 02/13/2011   NA 143 03/01/2011   K 3.2* 03/01/2011   CL 103 03/01/2011   CREATININE 0.8 03/01/2011   BUN 18 03/01/2011   CO2 31 03/01/2011   TSH 1.53 08/16/2010   HGBA1C 6.3* 02/14/2011   MICROALBUR 0.50 01/30/2010       Assessment & Plan:

## 2011-03-28 NOTE — Assessment & Plan Note (Signed)
She will start Klor and today I will check her K+ and Mg++ levels

## 2011-03-29 ENCOUNTER — Encounter: Payer: Self-pay | Admitting: Internal Medicine

## 2011-03-29 ENCOUNTER — Ambulatory Visit
Admission: RE | Admit: 2011-03-29 | Discharge: 2011-03-29 | Disposition: A | Discharge status: Home / Self Care | Payer: Medicare Other | Source: Ambulatory Visit | Attending: Gastroenterology | Admitting: Gastroenterology

## 2011-03-29 DIAGNOSIS — M7989 Other specified soft tissue disorders: Secondary | ICD-10-CM

## 2011-03-29 MED ORDER — IOHEXOL 300 MG/ML  SOLN
75.0000 mL | Freq: Once | INTRAMUSCULAR | Status: AC | PRN
  Administered 2011-03-29: 75 mL via INTRAVENOUS

## 2011-04-27 ENCOUNTER — Other Ambulatory Visit (INDEPENDENT_AMBULATORY_CARE_PROVIDER_SITE_OTHER): Payer: Medicare Other

## 2011-04-27 ENCOUNTER — Ambulatory Visit (INDEPENDENT_AMBULATORY_CARE_PROVIDER_SITE_OTHER)
Admission: RE | Admit: 2011-04-27 | Discharge: 2011-04-27 | Disposition: A | Discharge status: Home / Self Care | Payer: Medicare Other | Source: Ambulatory Visit | Attending: Internal Medicine | Admitting: Internal Medicine

## 2011-04-27 ENCOUNTER — Encounter: Payer: Self-pay | Admitting: Internal Medicine

## 2011-04-27 ENCOUNTER — Ambulatory Visit (INDEPENDENT_AMBULATORY_CARE_PROVIDER_SITE_OTHER): Payer: Medicare Other | Admitting: Internal Medicine

## 2011-04-27 VITALS — BP 112/80 | HR 80 | Temp 98.7°F | Resp 16 | Wt 252.0 lb

## 2011-04-27 DIAGNOSIS — M545 Low back pain, unspecified: Secondary | ICD-10-CM

## 2011-04-27 DIAGNOSIS — I10 Essential (primary) hypertension: Secondary | ICD-10-CM

## 2011-04-27 DIAGNOSIS — E876 Hypokalemia: Secondary | ICD-10-CM

## 2011-04-27 DIAGNOSIS — IMO0001 Reserved for inherently not codable concepts without codable children: Secondary | ICD-10-CM

## 2011-04-27 DIAGNOSIS — G4730 Sleep apnea, unspecified: Secondary | ICD-10-CM

## 2011-04-27 DIAGNOSIS — D72829 Elevated white blood cell count, unspecified: Secondary | ICD-10-CM

## 2011-04-27 LAB — CBC WITH DIFFERENTIAL/PLATELET
Basophils Absolute: 0 10*3/uL (ref 0.0–0.1)
Basophils Relative: 0.3 % (ref 0.0–3.0)
Eosinophils Absolute: 0.3 10*3/uL (ref 0.0–0.7)
Hemoglobin: 14.3 g/dL (ref 12.0–15.0)
Lymphs Abs: 3.3 10*3/uL (ref 0.7–4.0)
MCHC: 33.6 g/dL (ref 30.0–36.0)
MCV: 91.8 fl (ref 78.0–100.0)
Monocytes Absolute: 1.2 10*3/uL — ABNORMAL HIGH (ref 0.1–1.0)
Neutro Abs: 8.1 10*3/uL — ABNORMAL HIGH (ref 1.4–7.7)
RBC: 4.63 Mil/uL (ref 3.87–5.11)
RDW: 13.7 % (ref 11.5–14.6)

## 2011-04-27 LAB — BASIC METABOLIC PANEL
BUN: 11 mg/dL (ref 6–23)
Calcium: 9.3 mg/dL (ref 8.4–10.5)
Creatinine, Ser: 0.9 mg/dL (ref 0.4–1.2)
GFR: 86.78 mL/min (ref >60.00)
Glucose, Bld: 137 mg/dL — ABNORMAL HIGH (ref 70–99)

## 2011-04-27 MED ORDER — AZILSARTAN-CHLORTHALIDONE 40-12.5 MG PO TABS: 1.0000 | ORAL_TABLET | Freq: Every day | ORAL | Status: DC

## 2011-04-27 MED ORDER — SITAGLIPTIN PHOSPHATE 100 MG PO TABS: 100.0000 mg | ORAL_TABLET | Freq: Every day | ORAL | Status: DC

## 2011-04-27 NOTE — Patient Instructions (Signed)
Hypertension As your heart beats, it forces blood through your arteries. This force is your blood pressure. If the pressure is too high, it is called hypertension (HTN) or high blood pressure. HTN is dangerous because you may have it and not know it. High blood pressure may mean that your heart has to work harder to pump blood. Your arteries may be narrow or stiff. The extra work puts you at risk for heart disease, stroke, and other problems.  Blood pressure consists of two numbers, a higher number over a lower, 110/72, for example. It is stated as "110 over 72." The ideal is below 120 for the top number (systolic) and under 80 for the bottom (diastolic). Write down your blood pressure today. You should pay close attention to your blood pressure if you have certain conditions such as:  Heart failure.   Prior heart attack.   Diabetes   Chronic kidney disease.   Prior stroke.   Multiple risk factors for heart disease.  To see if you have HTN, your blood pressure should be measured while you are seated with your arm held at the level of the heart. It should be measured at least twice. A one-time elevated blood pressure reading (especially in the Emergency Department) does not mean that you need treatment. There may be conditions in which the blood pressure is different between your right and left arms. It is important to see your caregiver soon for a recheck. Most people have essential hypertension which means that there is not a specific cause. This type of high blood pressure may be lowered by changing lifestyle factors such as:  Stress.   Smoking.   Lack of exercise.   Excessive weight.   Drug/tobacco/alcohol use.   Eating less salt.  Most people do not have symptoms from high blood pressure until it has caused damage to the body. Effective treatment can often prevent, delay or reduce that damage. TREATMENT  When a cause has been identified, treatment for high blood pressure is  directed at the cause. There are a large number of medications to treat HTN. These fall into several categories, and your caregiver will help you select the medicines that are best for you. Medications may have side effects. You should review side effects with your caregiver. If your blood pressure stays high after you have made lifestyle changes or started on medicines,   Your medication(s) may need to be changed.   Other problems may need to be addressed.   Be certain you understand your prescriptions, and know how and when to take your medicine.   Be sure to follow up with your caregiver within the time frame advised (usually within two weeks) to have your blood pressure rechecked and to review your medications.   If you are taking more than one medicine to lower your blood pressure, make sure you know how and at what times they should be taken. Taking two medicines at the same time can result in blood pressure that is too low.  SEEK IMMEDIATE MEDICAL CARE IF:  You develop a severe headache, blurred or changing vision, or confusion.   You have unusual weakness or numbness, or a faint feeling.   You have severe chest or abdominal pain, vomiting, or breathing problems.  MAKE SURE YOU:   Understand these instructions.   Will watch your condition.   Will get help right away if you are not doing well or get worse.  Document Released: 04/30/2005 Document Revised: 01/10/2011 Document Reviewed:  12/19/2007 Exit[employer] Patient Information 2012 [place].Back Pain, Adult Low back pain is very common. About 1 in 5 people have back pain.The cause of low back pain is rarely dangerous. The pain often gets better over time.About half of people with a sudden onset of back pain feel better in just 2 weeks. About 8 in 10 people feel better by 6 weeks.  CAUSES Some common causes of back pain include:  Strain of the muscles or ligaments supporting the spine.   Wear and tear (degeneration) of the  spinal discs.   Arthritis.   Direct injury to the back.  DIAGNOSIS Most of the time, the direct cause of low back pain is not known.However, back pain can be treated effectively even when the exact cause of the pain is unknown.Answering your caregiver's questions about your overall health and symptoms is one of the most accurate ways to make sure the cause of your pain is not dangerous. If your caregiver needs more information, he or she may order lab work or imaging tests (X-rays or MRIs).However, even if imaging tests show changes in your back, this usually does not require surgery. HOME CARE INSTRUCTIONS For many people, back pain returns.Since low back pain is rarely dangerous, it is often a condition that people can learn to [facility] their own.   Remain active. It is stressful on the back to sit or stand in one place. Do not sit, drive, or stand in one place for more than 30 minutes at a time. Take short walks on level surfaces as soon as pain allows.Try to increase the length of time you walk each day.   Do not stay in bed.Resting more than 1 or 2 days can delay your recovery.   Do not avoid exercise or work.Your body is made to move.It is not dangerous to be active, even though your back may hurt.Your back will likely heal faster if you return to being active before your pain is gone.   Pay attention to your body when you bend and lift. Many people have less discomfortwhen lifting if they bend their knees, keep the load close to their bodies,and avoid twisting. Often, the most comfortable positions are those that put less stress on your recovering back.   Find a comfortable position to sleep. Use a firm mattress and lie on your side with your knees slightly bent. If you lie on your back, put a pillow under your knees.   Only take over-the-counter or prescription medicines as directed by your caregiver. Over-the-counter medicines to reduce pain and inflammation are often the  most helpful.Your caregiver may prescribe muscle relaxant drugs.These medicines help dull your pain so you can more quickly return to your normal activities and healthy exercise.   Put ice on the injured area.   Put ice in a plastic bag.   Place a towel between your skin and the bag.   Leave the ice on for 15 to 20 minutes, 3 to 4 times a day for the first 2 to 3 days. After that, ice and heat may be alternated to reduce pain and spasms.   Ask your caregiver about trying back exercises and gentle massage. This may be of some benefit.   Avoid feeling anxious or stressed.Stress increases muscle tension and can worsen back pain.It is important to recognize when you are anxious or stressed and learn ways to manage it.Exercise is a great option.  SEEK MEDICAL CARE IF:  You have pain that is not relieved with  rest or medicine.   You have pain that does not improve in 1 week.   You have new symptoms.   You are generally not feeling well.  SEEK IMMEDIATE MEDICAL CARE IF:   You have pain that radiates from your back into your legs.   You develop new bowel or bladder control problems.   You have unusual weakness or numbness in your arms or legs.   You develop nausea or vomiting.   You develop abdominal pain.   You feel faint.  Document Released: 04/30/2005 Document Revised: 01/10/2011 Document Reviewed: 09/18/2010 [facility] Patient Information 2012 [place].

## 2011-04-27 NOTE — Progress Notes (Signed)
Subjective:    Patient ID: April May, female    DOB: 1956-01-20, 55 y.o.   MRN: [id number]  Cough This is a recurrent problem. The current episode started more than 1 month ago. The problem has been unchanged. The problem occurs every few minutes. The cough is non-productive. Associated symptoms include postnasal drip. Pertinent negatives include no chest pain, chills, ear congestion, ear pain, fever, headaches, heartburn, hemoptysis, myalgias, nasal congestion, rash, rhinorrhea, sore throat, shortness of breath, sweats, weight loss or wheezing. The symptoms are aggravated by nothing. She has tried OTC cough suppressant for the symptoms. The treatment provided no relief. Her past medical history is significant for environmental allergies.  Back Pain This is a recurrent problem. The current episode started more than 1 year ago. The problem occurs intermittently. The problem is unchanged. The pain is present in the lumbar spine. The quality of the pain is described as aching. The pain does not radiate. The pain is at a severity of 4/10. The pain is mild. The pain is worse during the day. The symptoms are aggravated by bending. Pertinent negatives include no abdominal pain, bladder incontinence, bowel incontinence, chest pain, dysuria, fever, headaches, leg pain, numbness, paresis, paresthesias, pelvic pain, perianal numbness, tingling, weakness or weight loss. Risk factors include lack of exercise and obesity. She has tried nothing for the symptoms.      Review of Systems  Constitutional: Positive for unexpected weight change (weight gain). Negative for fever, chills, weight loss, diaphoresis, activity change, appetite change and fatigue.  HENT: Positive for postnasal drip. Negative for hearing loss, ear pain, sore throat, facial swelling, rhinorrhea, neck pain, neck stiffness and ear discharge.   Eyes: Negative.   Respiratory: Positive for apnea (and heavy snoring) and cough. Negative for  hemoptysis, choking, chest tightness, shortness of breath, wheezing and stridor.   Cardiovascular: Negative for chest pain, palpitations and leg swelling.  Gastrointestinal: Negative for heartburn, nausea, vomiting, abdominal pain, diarrhea, constipation, blood in stool, abdominal distention and bowel incontinence.  Genitourinary: Negative for bladder incontinence, dysuria, frequency, hematuria, flank pain, enuresis, difficulty urinating, pelvic pain and dyspareunia.  Musculoskeletal: Positive for back pain. Negative for myalgias, joint swelling, arthralgias and gait problem.  Skin: Negative for color change, pallor, rash and wound.  Neurological: Negative for dizziness, tingling, tremors, seizures, syncope, facial asymmetry, speech difficulty, weakness, light-headedness, numbness, headaches and paresthesias.  Hematological: Positive for environmental allergies. Negative for adenopathy. Does not bruise/bleed easily.  Psychiatric/Behavioral: Negative.        Objective:   Physical Exam  Vitals reviewed. Constitutional: She appears well-developed and well-nourished. No distress.  HENT:  Head: Normocephalic and atraumatic.  Mouth/Throat: Oropharynx is clear and moist. No oropharyngeal exudate.  Eyes: Conjunctivae are normal. Right eye exhibits no discharge. Left eye exhibits no discharge. No scleral icterus.  Neck: Normal range of motion. Neck supple. No JVD present. No tracheal deviation present. No thyromegaly present.  Cardiovascular: Normal rate, regular rhythm, normal heart sounds and intact distal pulses.  Exam reveals no gallop and no friction rub.   No murmur heard. Pulmonary/Chest: Effort normal and breath sounds normal. No respiratory distress. She has no wheezes. She has no rales. She exhibits no tenderness.  Abdominal: Soft. Bowel sounds are normal. She exhibits no distension and no mass. There is no tenderness. There is no rebound and no guarding.  Musculoskeletal: Normal range of  motion. She exhibits no edema and no tenderness.       Lumbar back: Normal. She exhibits normal range of  motion, no tenderness, no bony tenderness, no swelling, no edema, no deformity, no laceration, no pain and no spasm.  Lymphadenopathy:    She has no cervical adenopathy.  Neurological: She is alert. She has normal strength. She displays no atrophy, no tremor and normal reflexes. No cranial nerve deficit or sensory deficit. She exhibits normal muscle tone. She displays a negative Romberg sign. She displays no seizure activity. Coordination and gait normal. She displays no Babinski's sign on the right side. She displays no Babinski's sign on the left side.  Reflex Scores:      Tricep reflexes are 1+ on the right side and 1+ on the left side.      Bicep reflexes are 1+ on the right side and 1+ on the left side.      Brachioradialis reflexes are 1+ on the right side and 1+ on the left side.      Patellar reflexes are 1+ on the right side and 1+ on the left side.      Achilles reflexes are 1+ on the right side and 1+ on the left side. Skin: Skin is warm and dry. No rash noted. She is not diaphoretic. No erythema. No pallor.  Psychiatric: She has a normal mood and affect. Her behavior is normal. Judgment and thought content normal.     Lab Results  Component Value Date   WBC 14.4* 03/28/2011   HGB 14.2 03/28/2011   HCT 43.2 03/28/2011   PLT 326.0 03/28/2011   GLUCOSE 113* 03/28/2011   CHOL 160 01/30/2010   TRIG 103 01/30/2010   HDL 38* 01/30/2010   LDLCALC 101* 01/30/2010   ALT 22 02/13/2011   AST 19 02/13/2011   NA 142 03/28/2011   K 3.9 03/28/2011   CL 104 03/28/2011   CREATININE 0.9 03/28/2011   BUN 15 03/28/2011   CO2 30 03/28/2011   TSH 0.96 03/28/2011   HGBA1C 6.7* 03/28/2011   MICROALBUR 0.50 01/30/2010       Assessment & Plan:

## 2011-04-29 NOTE — Assessment & Plan Note (Signed)
I will recheck her K+ level today 

## 2011-04-29 NOTE — Assessment & Plan Note (Signed)
I will check a plain xray to look for occult fracture, DDD, lytic lesion, and have advised her on the usual management of LBP

## 2011-04-29 NOTE — Assessment & Plan Note (Signed)
Referral for sleep studies

## 2011-04-29 NOTE — Assessment & Plan Note (Signed)
Her BP is well controlled, I think the chronic cough has been caused by the ACEI so I stopped that and started an ARB instead, I will check her lytes today

## 2011-04-29 NOTE — Assessment & Plan Note (Signed)
I will recheck her A1C today 

## 2011-04-29 NOTE — Assessment & Plan Note (Signed)
She has no localizing s/s to go with this, I think it is related to the steroid therapy and will recheck her CBC today and follow from there

## 2011-05-12 ENCOUNTER — Other Ambulatory Visit: Payer: Self-pay | Admitting: Internal Medicine

## 2011-05-18 ENCOUNTER — Institutional Professional Consult (permissible substitution): Payer: Medicare Other | Admitting: Pulmonary Disease

## 2011-05-30 ENCOUNTER — Ambulatory Visit: Payer: Medicare Other | Admitting: Internal Medicine

## 2011-06-06 ENCOUNTER — Telehealth: Payer: Self-pay

## 2011-06-06 NOTE — Telephone Encounter (Signed)
Patient had xray done 1 month ago and never got results. Call please to inform at [phone]

## 2011-06-13 ENCOUNTER — Encounter: Payer: Self-pay | Admitting: Internal Medicine

## 2011-06-13 ENCOUNTER — Ambulatory Visit (INDEPENDENT_AMBULATORY_CARE_PROVIDER_SITE_OTHER): Payer: Medicare Other | Admitting: Internal Medicine

## 2011-06-13 ENCOUNTER — Other Ambulatory Visit (INDEPENDENT_AMBULATORY_CARE_PROVIDER_SITE_OTHER): Payer: Medicare Other

## 2011-06-13 DIAGNOSIS — I10 Essential (primary) hypertension: Secondary | ICD-10-CM

## 2011-06-13 DIAGNOSIS — E876 Hypokalemia: Secondary | ICD-10-CM

## 2011-06-13 DIAGNOSIS — F17200 Nicotine dependence, unspecified, uncomplicated: Secondary | ICD-10-CM

## 2011-06-13 DIAGNOSIS — IMO0001 Reserved for inherently not codable concepts without codable children: Secondary | ICD-10-CM

## 2011-06-13 DIAGNOSIS — E785 Hyperlipidemia, unspecified: Secondary | ICD-10-CM | POA: Insufficient documentation

## 2011-06-13 DIAGNOSIS — E7800 Pure hypercholesterolemia, unspecified: Secondary | ICD-10-CM

## 2011-06-13 DIAGNOSIS — D72829 Elevated white blood cell count, unspecified: Secondary | ICD-10-CM

## 2011-06-13 LAB — BASIC METABOLIC PANEL
CO2: 29 mEq/L (ref 19–32)
Calcium: 9.5 mg/dL (ref 8.4–10.5)
Chloride: 106 mEq/L (ref 96–112)
Glucose, Bld: 96 mg/dL (ref 70–99)
Sodium: 141 mEq/L (ref 135–145)

## 2011-06-13 LAB — CBC WITH DIFFERENTIAL/PLATELET
Eosinophils Relative: 2.4 % (ref 0.0–5.0)
HCT: 41.1 % (ref 36.0–46.0)
Hemoglobin: 13.9 g/dL (ref 12.0–15.0)
Lymphs Abs: 3.3 10*3/uL (ref 0.7–4.0)
Monocytes Relative: 9 % (ref 3.0–12.0)
Platelets: 319 10*3/uL (ref 150.0–400.0)
WBC: 12.1 10*3/uL — ABNORMAL HIGH (ref 4.5–10.5)

## 2011-06-13 MED ORDER — ROSUVASTATIN CALCIUM 10 MG PO TABS: 10.0000 mg | ORAL_TABLET | Freq: Every day | ORAL | Status: DC

## 2011-06-13 MED ORDER — AZILSARTAN-CHLORTHALIDONE 40-25 MG PO TABS: 1.0000 | ORAL_TABLET | Freq: Every day | ORAL | Status: DC

## 2011-06-13 NOTE — Patient Instructions (Signed)
Hypertension As your heart beats, it forces blood through your arteries. This force is your blood pressure. If the pressure is too high, it is called hypertension (HTN) or high blood pressure. HTN is dangerous because you may have it and not know it. High blood pressure may mean that your heart has to work harder to pump blood. Your arteries may be narrow or stiff. The extra work puts you at risk for heart disease, stroke, and other problems.  Blood pressure consists of two numbers, a higher number over a lower, 110/72, for example. It is stated as "110 over 72." The ideal is below 120 for the top number (systolic) and under 80 for the bottom (diastolic). Write down your blood pressure today. You should pay close attention to your blood pressure if you have certain conditions such as:  Heart failure.   Prior heart attack.   Diabetes   Chronic kidney disease.   Prior stroke.   Multiple risk factors for heart disease.  To see if you have HTN, your blood pressure should be measured while you are seated with your arm held at the level of the heart. It should be measured at least twice. A one-time elevated blood pressure reading (especially in the Emergency Department) does not mean that you need treatment. There may be conditions in which the blood pressure is different between your right and left arms. It is important to see your caregiver soon for a recheck. Most people have essential hypertension which means that there is not a specific cause. This type of high blood pressure may be lowered by changing lifestyle factors such as:  Stress.   Smoking.   Lack of exercise.   Excessive weight.   Drug/tobacco/alcohol use.   Eating less salt.  Most people do not have symptoms from high blood pressure until it has caused damage to the body. Effective treatment can often prevent, delay or reduce that damage. TREATMENT  When a cause has been identified, treatment for high blood pressure is  directed at the cause. There are a large number of medications to treat HTN. These fall into several categories, and your caregiver will help you select the medicines that are best for you. Medications may have side effects. You should review side effects with your caregiver. If your blood pressure stays high after you have made lifestyle changes or started on medicines,   Your medication(s) may need to be changed.   Other problems may need to be addressed.   Be certain you understand your prescriptions, and know how and when to take your medicine.   Be sure to follow up with your caregiver within the time frame advised (usually within two weeks) to have your blood pressure rechecked and to review your medications.   If you are taking more than one medicine to lower your blood pressure, make sure you know how and at what times they should be taken. Taking two medicines at the same time can result in blood pressure that is too low.  SEEK IMMEDIATE MEDICAL CARE IF:  You develop a severe headache, blurred or changing vision, or confusion.   You have unusual weakness or numbness, or a faint feeling.   You have severe chest or abdominal pain, vomiting, or breathing problems.  MAKE SURE YOU:   Understand these instructions.   Will watch your condition.   Will get help right away if you are not doing well or get worse.  Document Released: 04/30/2005 Document Revised: 01/10/2011 Document Reviewed:   12/19/2007 [employer] Patient Information 2012 [employer].Diabetes, Type 2 Diabetes is a long-lasting (chronic) disease. In type 2 diabetes, the pancreas does not make enough insulin (a hormone), and the body does not respond normally to the insulin that is made. This type of diabetes was also previously called adult-onset diabetes. It usually occurs after the age of 40, but it can occur at any age.  CAUSES  Type 2 diabetes happens because the pancreasis not making enough insulin or your body has  trouble using the insulin that your pancreas does make properly. SYMPTOMS   Drinking more than usual.   Urinating more than usual.   Blurred vision.   Dry, itchy skin.   Frequent infections.   Feeling more tired than usual (fatigue).  DIAGNOSIS The diagnosis of type 2 diabetes is usually made by one of the following tests:  Fasting blood glucose test. You will not eat for at least 8 hours and then take a blood test.   Random blood glucose test. Your blood glucose (sugar) is checked at any time of the day regardless of when you ate.   Oral glucose tolerance test (OGTT). Your blood glucose is measured after you have not eaten (fasted) and then after you drink a glucose containing beverage.  TREATMENT   Healthy eating.   Exercise.   Medicine, if needed.   Monitoring blood glucose.   Seeing your caregiver regularly.  HOME CARE INSTRUCTIONS   Check your blood glucose at least once a day. More frequent monitoring may be necessary, depending on your medicines and on how well your diabetes is controlled. Your caregiver will advise you.   Take your medicine as directed by your caregiver.   Do not smoke.   Make wise food choices. Ask your caregiver for information. Weight loss can improve your diabetes.   Learn about low blood glucose (hypoglycemia) and how to treat it.   Get your eyes checked regularly.   Have a yearly physical exam. Have your blood pressure checked and your blood and urine tested.   Wear a pendant or bracelet saying that you have diabetes.   Check your feet every night for cuts, sores, blisters, and redness. Let your caregiver know if you have any problems.  SEEK MEDICAL CARE IF:   You have problems keeping your blood glucose in target range.   You have problems with your medicines.   You have symptoms of an illness that do not improve after 24 hours.   You have a sore or wound that is not healing.   You notice a change in vision or a new problem  with your vision.   You have a fever.  MAKE SURE YOU:  Understand these instructions.   Will watch your condition.   Will get help right away if you are not doing well or get worse.  Document Released: 04/30/2005 Document Revised: 01/11/2011 Document Reviewed: 10/16/2010 [employer] Patient Information 2012 [employer]. 

## 2011-06-13 NOTE — Progress Notes (Signed)
Subjective:    Patient ID: April May, female    DOB: July 14, 1955, 56 y.o.   MRN: [id number]  Hypertension This is a chronic problem. The current episode started more than 1 year ago. The problem is unchanged. The problem is controlled. Associated symptoms include peripheral edema. Pertinent negatives include no anxiety, blurred vision, chest pain, headaches, malaise/fatigue, neck pain, orthopnea, palpitations, PND, shortness of breath or sweats. Past treatments include angiotensin blockers and diuretics. The current treatment provides significant improvement. Compliance problems include exercise and diet.   Diabetes She presents for her follow-up diabetic visit. She has type 2 diabetes mellitus. Her disease course has been stable. There are no hypoglycemic associated symptoms. Pertinent negatives for hypoglycemia include no dizziness, headaches, pallor, seizures, speech difficulty, sweats or tremors. Pertinent negatives for diabetes include no blurred vision, no chest pain, no fatigue, no foot paresthesias, no foot ulcerations, no polydipsia, no polyphagia, no polyuria, no visual change, no weakness and no weight loss. There are no hypoglycemic complications. Symptoms are stable. There are no diabetic complications. Current diabetic treatment includes oral agent (monotherapy). She is compliant with treatment all of the time. Her weight is stable. She is following a generally healthy diet. Meal planning includes avoidance of concentrated sweets. She has not had a previous visit with a dietician. She never participates in exercise. There is no change in her home blood glucose trend. An ACE inhibitor/angiotensin II receptor blocker is being taken. She does not see a podiatrist.Eye exam is current.      Review of Systems  Constitutional: Negative for fever, chills, weight loss, malaise/fatigue, diaphoresis, activity change, appetite change, fatigue and unexpected weight change.  HENT: Negative.   Negative for neck pain.   Eyes: Negative.  Negative for blurred vision.  Respiratory: Negative for apnea, cough, choking, chest tightness, shortness of breath, wheezing and stridor.   Cardiovascular: Negative for chest pain, palpitations, orthopnea, leg swelling and PND.  Gastrointestinal: Negative for nausea, vomiting, abdominal pain, diarrhea, constipation, abdominal distention and anal bleeding.  Genitourinary: Negative for dysuria, urgency, polyuria, frequency, hematuria, flank pain, decreased urine volume, enuresis, difficulty urinating and dyspareunia.  Musculoskeletal: Negative for myalgias, back pain, joint swelling, arthralgias and gait problem.  Skin: Negative for color change, pallor, rash and wound.  Neurological: Negative for dizziness, tremors, seizures, syncope, facial asymmetry, speech difficulty, weakness, light-headedness, numbness and headaches.  Hematological: Negative for polydipsia, polyphagia and adenopathy. Does not bruise/bleed easily.  Psychiatric/Behavioral: Negative.        Objective:   Physical Exam  Vitals reviewed. Constitutional: She is oriented to person, place, and time. She appears well-developed and well-nourished. No distress.  HENT:  Head: Normocephalic and atraumatic.  Mouth/Throat: Oropharynx is clear and moist. No oropharyngeal exudate.  Eyes: Conjunctivae are normal. Right eye exhibits no discharge. Left eye exhibits no discharge. No scleral icterus.  Neck: Normal range of motion. Neck supple. No JVD present. No tracheal deviation present. No thyromegaly present.  Cardiovascular: Normal rate, regular rhythm, normal heart sounds and intact distal pulses.  Exam reveals no gallop and no friction rub.   No murmur heard. Pulmonary/Chest: Effort normal and breath sounds normal. No stridor. No respiratory distress. She has no wheezes. She has no rales. She exhibits no tenderness.  Abdominal: Soft. Bowel sounds are normal. She exhibits no distension and no  mass. There is no tenderness. There is no rebound and no guarding.  Musculoskeletal: Normal range of motion. She exhibits no edema and no tenderness.  Lymphadenopathy:    She has  no cervical adenopathy.  Neurological: She is oriented to person, place, and time.  Skin: Skin is warm and dry. No rash noted. She is not diaphoretic. No erythema. No pallor.  Psychiatric: She has a normal mood and affect. Her behavior is normal. Judgment and thought content normal.      Lab Results  Component Value Date   WBC 12.9* 04/27/2011   HGB 14.3 04/27/2011   HCT 42.5 04/27/2011   PLT 298.0 04/27/2011   GLUCOSE 137* 04/27/2011   CHOL 160 01/30/2010   TRIG 103 01/30/2010   HDL 38* 01/30/2010   LDLCALC 101* 01/30/2010   ALT 22 02/13/2011   AST 19 02/13/2011   NA 141 04/27/2011   K 3.4* 04/27/2011   CL 106 04/27/2011   CREATININE 0.9 04/27/2011   BUN 11 04/27/2011   CO2 27 04/27/2011   TSH 0.96 03/28/2011   HGBA1C 7.0* 04/27/2011   MICROALBUR 0.50 01/30/2010      Assessment & Plan:

## 2011-06-17 NOTE — Assessment & Plan Note (Signed)
Start crestor 

## 2011-06-17 NOTE — Assessment & Plan Note (Signed)
Recheck her CBC

## 2011-06-17 NOTE — Assessment & Plan Note (Signed)
She agrees to quit smoking

## 2011-06-17 NOTE — Assessment & Plan Note (Signed)
Recheck her BMP

## 2011-06-17 NOTE — Assessment & Plan Note (Signed)
I will check her a1c today 

## 2011-06-22 ENCOUNTER — Telehealth: Payer: Self-pay

## 2011-06-22 NOTE — Telephone Encounter (Signed)
Labs show an elevated WBC count, otherwise they look ok

## 2011-06-22 NOTE — Telephone Encounter (Signed)
Patient request lab results from 06/13/11. Thanks

## 2011-06-25 NOTE — Telephone Encounter (Signed)
Returned call back to patient and notified per MD

## 2011-07-05 ENCOUNTER — Ambulatory Visit (INDEPENDENT_AMBULATORY_CARE_PROVIDER_SITE_OTHER): Payer: Medicare Other | Admitting: Pulmonary Disease

## 2011-07-05 ENCOUNTER — Encounter: Payer: Self-pay | Admitting: Pulmonary Disease

## 2011-07-05 VITALS — BP 106/60 | HR 76 | Temp 98.0°F | Ht 62.0 in | Wt 252.2 lb

## 2011-07-05 DIAGNOSIS — G4730 Sleep apnea, unspecified: Secondary | ICD-10-CM

## 2011-07-05 NOTE — Assessment & Plan Note (Signed)
Given excessive daytime somnolence, narrow pharyngeal exam, witnessed apneas & loud snoring, obstructive sleep apnea is very likely & an overnight polysomnogram will be scheduled as a split study. The pathophysiology of obstructive sleep apnea , it's cardiovascular consequences & modes of treatment including CPAP were discused with the patient in detail & they evidenced understanding.  

## 2011-07-05 NOTE — Patient Instructions (Signed)
Schedule sleep study

## 2011-07-05 NOTE — Progress Notes (Signed)
  Subjective:    Patient ID: April May, female    DOB: 11-07-1955, 56 y.o.   MRN: [id number]  HPI 56 year old 1PPD smoker with depression and bipolar disorder referred for evaluation of obstructive sleep apnea. ESS 12/24, daytime naps x 3 h in the afternoon, sleepiness while watching TV, sitting inactive She takes lamictal  & Hydroxyzine at bedtime around 11 PM to midnight, sleep latency is 15 minutes after taking meds, 3-4 nocturnal awakenings including nocturia, she sleeps on her stomach with 2 pillows, out of bed at 8 AM with occasional headache, dryness of mouth and feeling tired. She naps for 3 hours daily and wakes up refreshed. She has gained about 40 pounds in the last few years. Crohn's disease is now well controlled off medications. She drinks one cup of coffee daily. There is no history suggestive of cataplexy, sleep paralysis or parasomnias Loud snoring has been noted by family members. She initially canceled her sleep study was then recorded herself sleeping and snoring and therefore kept her appointment today    Review of Systems  Constitutional: Positive for unexpected weight change. Negative for fever.  HENT: Negative for ear pain, nosebleeds, congestion, sore throat, rhinorrhea, sneezing, trouble swallowing, dental problem, postnasal drip and sinus pressure.   Eyes: Negative for redness and itching.  Respiratory: Negative for cough, chest tightness, shortness of breath and wheezing.   Cardiovascular: Positive for leg swelling. Negative for palpitations.  Gastrointestinal: Positive for abdominal pain. Negative for nausea and vomiting.  Genitourinary: Negative for dysuria.  Musculoskeletal: Positive for arthralgias. Negative for joint swelling.  Skin: Negative for rash.  Neurological: Negative for headaches.  Hematological: Does not bruise/bleed easily.  Psychiatric/Behavioral: Positive for dysphoric mood. The patient is not nervous/anxious.        Objective:   Physical Exam  Gen. Pleasant, obese, in no distress, normal affect ENT - no lesions, no post nasal drip, class 2-3 airway Neck: No JVD, no thyromegaly, no carotid bruits Lungs: no use of accessory muscles, no dullness to percussion, decreased without rales or rhonchi  Cardiovascular: Rhythm regular, heart sounds  normal, no murmurs or gallops, no peripheral edema Abdomen: soft and non-tender, no hepatosplenomegaly, BS normal. Musculoskeletal: No deformities, no cyanosis or clubbing Neuro:  alert, non focal, no tremors       Assessment & Plan:

## 2011-07-23 ENCOUNTER — Ambulatory Visit (HOSPITAL_BASED_OUTPATIENT_CLINIC_OR_DEPARTMENT_OTHER): Payer: Medicare Other | Attending: Pulmonary Disease | Admitting: Radiology

## 2011-07-23 VITALS — Ht 63.0 in | Wt 246.0 lb

## 2011-07-23 DIAGNOSIS — Z79899 Other long term (current) drug therapy: Secondary | ICD-10-CM | POA: Insufficient documentation

## 2011-07-23 DIAGNOSIS — R0989 Other specified symptoms and signs involving the circulatory and respiratory systems: Secondary | ICD-10-CM | POA: Insufficient documentation

## 2011-07-23 DIAGNOSIS — G4733 Obstructive sleep apnea (adult) (pediatric): Secondary | ICD-10-CM | POA: Insufficient documentation

## 2011-07-23 DIAGNOSIS — F17200 Nicotine dependence, unspecified, uncomplicated: Secondary | ICD-10-CM | POA: Insufficient documentation

## 2011-07-23 DIAGNOSIS — G4730 Sleep apnea, unspecified: Secondary | ICD-10-CM

## 2011-07-23 DIAGNOSIS — R0609 Other forms of dyspnea: Secondary | ICD-10-CM | POA: Insufficient documentation

## 2011-07-23 DIAGNOSIS — Z9989 Dependence on other enabling machines and devices: Secondary | ICD-10-CM

## 2011-07-23 DIAGNOSIS — F3130 Bipolar disorder, current episode depressed, mild or moderate severity, unspecified: Secondary | ICD-10-CM | POA: Insufficient documentation

## 2011-08-03 DIAGNOSIS — R0989 Other specified symptoms and signs involving the circulatory and respiratory systems: Secondary | ICD-10-CM

## 2011-08-03 DIAGNOSIS — Z79899 Other long term (current) drug therapy: Secondary | ICD-10-CM

## 2011-08-03 DIAGNOSIS — F3130 Bipolar disorder, current episode depressed, mild or moderate severity, unspecified: Secondary | ICD-10-CM

## 2011-08-03 DIAGNOSIS — F17200 Nicotine dependence, unspecified, uncomplicated: Secondary | ICD-10-CM

## 2011-08-03 DIAGNOSIS — G4733 Obstructive sleep apnea (adult) (pediatric): Secondary | ICD-10-CM

## 2011-08-03 DIAGNOSIS — R0609 Other forms of dyspnea: Secondary | ICD-10-CM

## 2011-08-04 NOTE — Procedures (Signed)
[name], April May           ACCOUNT NO.:  [id number]  MEDICAL RECORD NO.:  [id number]          PATIENT TYPE:  OUT  LOCATION:  SLEEP CENTER                 FACILITY:  [facility]  PHYSICIAN:  April Noel, MD      DATE OF BIRTH:  07/07/1955  DATE OF STUDY:  07/23/2011                           NOCTURNAL POLYSOMNOGRAM  REFERRING PHYSICIAN:  Rigoberto Noel, MD  INDICATION FOR STUDY:  Ms. April May is a 55 year old smoker with depression and bipolar disorder with loud snoring and excessive daytime somnolence.  She has recorded herself sleeping and snoring.  At the time of this study, she weighed 246 pounds with a height of 5 feet 3 inches, BMI of 44, neck size 14 inches.  EPWORTH SLEEPINESS SCORE:  7.  This intervention polysomnogram was performed with a sleep technologist in attendance.  EEG, EOG, EMG, EKG, and respiratory parameters were recorded.  Sleep stages, arousals, limb movements, and respiratory data were scored according to criteria laid out by the American Academy of Sleep Medicine.  MEDICATIONS:  Bedtime medications included Zoloft, potassium, Lamictal, and Wellbutrin at 10:30 p.m.  SLEEP ARCHITECTURE:  Lights out was at 10:41 p.m.  Lights on was at 4:58 a.m.  CPAP was initiated at 1:43 a.m. During the diagnostic period, total sleep time was 127 minutes with a sleep period time of 156 minutes and a sleep efficiency of 70%.  Sleep latency was 25 minutes.  Latency to REM sleep was 58 minutes.  Sleep stages as a percentage of total sleep time was N1 11%, N2 72%, N3 0%, REM sleep 17% (22 minutes).  No supine sleep was noted.  During the CPAP portion, REM sleep accounted for 102 minutes (58% ) and supine sleep accounted for 154 minutes.  RESPIRATORY DATA:  During the diagnostic portion, 4 obstructive apneas, 0 central apneas, 1 mixed apnea, and 38 hypopneas were noted with an apnea-hypopnea index of 20 events per hour and an RDI of 30 events per hour.  Due to this degree  of respiratory disturbance, CPAP was initiated at 5 cm and titrated with a full-face small-sized mask to a final level of 12 cm.  REM rebound was noted at a level of 10 cm for 23 minutes including 10 minutes of REM sleep, 4 hypopneas were noted with an AHI of 10 events per hour and a desaturation of 93%.  No desaturations or events were noted at the final level of 12 cm.  This appears to be the optimal level of use during the study.  No oxygen was applied.  AROUSAL DATA:  During the diagnostic portion, the arousal index was 26 events per hour.  During the titration portion, the arousal index was 23 events per hour.  Most of these being spontaneous.  LIMB MOVEMENT DATA:  Few limb movements were noted during the titration portion.  Most of these were not associated with arousals.  OXYGEN DATA:  The lowest desaturation during the diagnostic portion was 91%.  She spent 0 minutes with a saturation of less than 88% during the titration portion, that is improved to about 93-94% on CPAP.  No oxygen was applied.  CARDIAC DATA:  The low heart rate was 58 beats  per minute.  The high heart rate recorded was an artifact.  No arrhythmias were noted.  DISCUSSION:  She was desensitized with a small Quattro fullface mask, C- Flex of 3 cm was applied. CPAP titration was optimal. Snoring was eliminated with CPAP.  MOVEMENT-PARASOMNIA:none noted  IMPRESSIONS-RECOMMENDATIONS: 1. Severe obstructive sleep apnea with hypopneas and RERAs causing     sleep fragmentation and oxygen desaturation. 2. This was corrected by CPAP of 12 cm with a small full-face mask     with a C-Flex setting of +3 cm. 3. No evidence of cardiac arrhythmias, limb movements, or behavioral     disturbance during sleep.  RECOMMENDATIONS: 1. The treatment options for this degree of sleep disorder breathing     include weight loss and CPAP therapy. 2. CPAP can be initiated at 12 cm with a small full-face mask, with a     C-Flex  setting of 3 cm.  Compliance can be monitored at this level. 3. She should be advised against medications with sedative side effects.     She should be cautioned against driving when sleepy.     April Noel, MD    RVA/MEDQ  D:  08/03/2011 15:18:50  T:  08/04/2011 02:40:11  Job:  [id number]

## 2011-08-05 ENCOUNTER — Telehealth: Payer: Self-pay | Admitting: Pulmonary Disease

## 2011-08-05 DIAGNOSIS — G4733 Obstructive sleep apnea (adult) (pediatric): Secondary | ICD-10-CM

## 2011-08-05 NOTE — Telephone Encounter (Signed)
psg showed severe osa requiring cpap If agreeable, send order to dme for CPAPat 12 cm with a small full-face mask,humidity,  with a C-Flex setting of 3 cm.  Download & fu in 4 wks

## 2011-08-06 NOTE — Telephone Encounter (Signed)
I spoke with pt and she agree's to be set up with cpap machine. Pt does not want a full face mask. Please advise Dr. Elsworth Soho thanks

## 2011-08-06 NOTE — Telephone Encounter (Signed)
Ok- trial of nasal pillows

## 2011-08-07 NOTE — Telephone Encounter (Signed)
I have placed order and pt is aware. She had no questions and needed nothing further

## 2011-08-08 ENCOUNTER — Telehealth: Payer: Self-pay | Admitting: Pulmonary Disease

## 2011-08-08 DIAGNOSIS — G4730 Sleep apnea, unspecified: Secondary | ICD-10-CM

## 2011-08-08 NOTE — Telephone Encounter (Signed)
Mask of choice ok

## 2011-08-08 NOTE — Telephone Encounter (Signed)
Order sent as okay'd by RA.  Left detailed message on pt's answering machine informing her of this.  Advised to call with any questions / concerns.  Will sign off.

## 2011-08-08 NOTE — Telephone Encounter (Signed)
Per phone msg from 08/05/11, pt needs to start cpap.  Order was sent for nasal pillows.  However, pt states she did not even try the nasal pillows during the sleep study.  She does not want this or the full face mask.  She is requesting the mask that "only covers my nose and mouth."  She is aware RA out of office until tomorrow.  Dr. Elsworth Soho, pls advise if this is ok.  Thank you.

## 2011-08-08 NOTE — Telephone Encounter (Signed)
Patient called again requesting to clarify her previous conversation with Crystal..the patient does not want nasal pillows NOR a mask that covers her nose and mask.  She wants a nasal mask as this is what she used during the sleep study and liked it.  Dr Elsworth Soho please advise of order may be altered to patient getting a nasal mask.  Thanks.

## 2011-08-15 ENCOUNTER — Encounter: Payer: Self-pay | Admitting: Internal Medicine

## 2011-08-15 ENCOUNTER — Telehealth: Payer: Self-pay

## 2011-08-15 ENCOUNTER — Ambulatory Visit (INDEPENDENT_AMBULATORY_CARE_PROVIDER_SITE_OTHER): Payer: Medicare Other | Admitting: Internal Medicine

## 2011-08-15 ENCOUNTER — Other Ambulatory Visit (INDEPENDENT_AMBULATORY_CARE_PROVIDER_SITE_OTHER): Payer: Medicare Other

## 2011-08-15 VITALS — BP 110/78 | HR 82 | Temp 98.4°F | Resp 16 | Wt 247.0 lb

## 2011-08-15 DIAGNOSIS — I10 Essential (primary) hypertension: Secondary | ICD-10-CM

## 2011-08-15 DIAGNOSIS — E876 Hypokalemia: Secondary | ICD-10-CM

## 2011-08-15 DIAGNOSIS — D72829 Elevated white blood cell count, unspecified: Secondary | ICD-10-CM

## 2011-08-15 DIAGNOSIS — E7800 Pure hypercholesterolemia, unspecified: Secondary | ICD-10-CM

## 2011-08-15 DIAGNOSIS — IMO0001 Reserved for inherently not codable concepts without codable children: Secondary | ICD-10-CM

## 2011-08-15 LAB — LIPID PANEL: Total CHOL/HDL Ratio: 3

## 2011-08-15 LAB — CBC WITH DIFFERENTIAL/PLATELET
Basophils Relative: 0.4 % (ref 0.0–3.0)
Eosinophils Relative: 1.6 % (ref 0.0–5.0)
HCT: 40.7 % (ref 36.0–46.0)
Hemoglobin: 13.6 g/dL (ref 12.0–15.0)
Lymphs Abs: 2.8 10*3/uL (ref 0.7–4.0)
MCV: 92.1 fl (ref 78.0–100.0)
Monocytes Absolute: 1.1 10*3/uL — ABNORMAL HIGH (ref 0.1–1.0)
Monocytes Relative: 9.1 % (ref 3.0–12.0)
Platelets: 335 10*3/uL (ref 150.0–400.0)
RBC: 4.42 Mil/uL (ref 3.87–5.11)
WBC: 12.3 10*3/uL — ABNORMAL HIGH (ref 4.5–10.5)

## 2011-08-15 LAB — COMPREHENSIVE METABOLIC PANEL
ALT: 25 U/L (ref 0–35)
AST: 23 U/L (ref 0–37)
Albumin: 3.9 g/dL (ref 3.5–5.2)
BUN: 11 mg/dL (ref 6–23)
Calcium: 9.6 mg/dL (ref 8.4–10.5)
Chloride: 101 mEq/L (ref 96–112)
Potassium: 4.1 mEq/L (ref 3.5–5.1)

## 2011-08-15 LAB — CK: Total CK: 142 U/L (ref 7–177)

## 2011-08-15 LAB — HEMOGLOBIN A1C: Hgb A1c MFr Bld: 6.4 % (ref 4.6–6.5)

## 2011-08-15 MED ORDER — ATORVASTATIN CALCIUM 20 MG PO TABS: 20.0000 mg | ORAL_TABLET | Freq: Every day | ORAL | Status: DC

## 2011-08-15 MED ORDER — PITAVASTATIN CALCIUM 2 MG PO TABS: 1.0000 | ORAL_TABLET | Freq: Every day | ORAL | Status: DC

## 2011-08-15 MED ORDER — AZILSARTAN-CHLORTHALIDONE 40-25 MG PO TABS: 1.0000 | ORAL_TABLET | Freq: Every day | ORAL | Status: DC

## 2011-08-15 NOTE — Assessment & Plan Note (Signed)
Repeat CBC today

## 2011-08-15 NOTE — Patient Instructions (Signed)

## 2011-08-15 NOTE — Telephone Encounter (Signed)
Patient called [facility] stating that she was seen today and given new rx for livalo. Medication is too expensive and she can not afford. Please advise if pt should remain on crestor. THanks

## 2011-08-15 NOTE — Telephone Encounter (Signed)
No, I will change to lipitor

## 2011-08-15 NOTE — Assessment & Plan Note (Signed)
She is having muscle and joint aches on crestor so I have asked her to change to livalo since it is known to be better tolerated than crestor is, I will check her CPK FLP CMP TSH today

## 2011-08-15 NOTE — Telephone Encounter (Signed)
Patient notified

## 2011-08-15 NOTE — Assessment & Plan Note (Signed)
Recheck the K+ level today

## 2011-08-15 NOTE — Assessment & Plan Note (Signed)
Her BP is well controlled, I will check her lytes and renal function today 

## 2011-08-15 NOTE — Assessment & Plan Note (Signed)
I will check her a1c today to see how well her blood sugar is controlled

## 2011-08-15 NOTE — Progress Notes (Signed)
Subjective:    Patient ID: April May, female    DOB: 1956/04/11, 56 y.o.   MRN: [id number]  Diabetes She presents for her follow-up diabetic visit. She has type 2 diabetes mellitus. Her disease course has been stable. There are no hypoglycemic associated symptoms. Pertinent negatives for hypoglycemia include no dizziness, headaches, pallor, seizures, speech difficulty, sweats or tremors. Pertinent negatives for diabetes include no blurred vision, no chest pain, no fatigue, no foot paresthesias, no foot ulcerations, no polydipsia, no polyphagia, no polyuria, no visual change, no weakness and no weight loss. There are no hypoglycemic complications. Symptoms are stable. There are no diabetic complications. Current diabetic treatment includes oral agent (monotherapy). She is compliant with treatment most of the time. Her weight is increasing steadily. She is following a generally unhealthy diet. When asked about meal planning, she reported none. She has not had a previous visit with a dietician. She never participates in exercise. There is no change in her home blood glucose trend. An ACE inhibitor/angiotensin II receptor blocker is being taken. She does not see a podiatrist.Eye exam is current.  Hyperlipidemia This is a chronic problem. The current episode started more than 1 year ago. The problem is controlled. Recent lipid tests were reviewed and are variable. Exacerbating diseases include diabetes and obesity. She has no history of chronic renal disease, hypothyroidism, liver disease or nephrotic syndrome. Factors aggravating her hyperlipidemia include thiazides and fatty foods. Associated symptoms include myalgias. Pertinent negatives include no chest pain, focal sensory loss, focal weakness, leg pain or shortness of breath. Current antihyperlipidemic treatment includes statins. The current treatment provides moderate improvement of lipids. Compliance problems include adherence to exercise and  adherence to diet.   Hypertension This is a chronic problem. The current episode started more than 1 year ago. The problem has been gradually improving since onset. The problem is controlled. Pertinent negatives include no anxiety, blurred vision, chest pain, headaches, malaise/fatigue, neck pain, orthopnea, palpitations, peripheral edema, PND, shortness of breath or sweats. There is no history of chronic renal disease.      Review of Systems  Constitutional: Negative for fever, chills, weight loss, malaise/fatigue, diaphoresis, activity change, appetite change, fatigue and unexpected weight change.  HENT: Negative.  Negative for neck pain.   Eyes: Negative.  Negative for blurred vision.  Respiratory: Negative for apnea, cough, choking, chest tightness, shortness of breath, wheezing and stridor.   Cardiovascular: Negative for chest pain, palpitations, orthopnea, leg swelling and PND.  Gastrointestinal: Negative for nausea, vomiting, abdominal pain, diarrhea, constipation, blood in stool and abdominal distention.  Genitourinary: Negative.  Negative for polyuria.  Musculoskeletal: Positive for myalgias. Negative for back pain, joint swelling, arthralgias and gait problem.  Skin: Negative for color change, pallor, rash and wound.  Neurological: Negative for dizziness, tremors, focal weakness, seizures, syncope, facial asymmetry, speech difficulty, weakness, light-headedness, numbness and headaches.  Hematological: Negative for polydipsia, polyphagia and adenopathy. Does not bruise/bleed easily.       Objective:   Physical Exam  Vitals reviewed. Constitutional: She is oriented to person, place, and time. She appears well-developed and well-nourished. No distress.  HENT:  Head: Normocephalic and atraumatic.  Mouth/Throat: Oropharynx is clear and moist. No oropharyngeal exudate.  Eyes: Conjunctivae are normal. Right eye exhibits no discharge. Left eye exhibits no discharge. No scleral icterus.   Neck: Normal range of motion. Neck supple. No JVD present. No tracheal deviation present. No thyromegaly present.  Cardiovascular: Normal rate, regular rhythm, normal heart sounds and intact distal pulses.  Exam reveals no gallop and no friction rub.   No murmur heard. Pulmonary/Chest: Effort normal and breath sounds normal. No stridor. No respiratory distress. She has no wheezes. She has no rales. She exhibits no tenderness.  Abdominal: Soft. Bowel sounds are normal. She exhibits no distension and no mass. There is no tenderness. There is no rebound and no guarding.  Musculoskeletal: Normal range of motion. She exhibits no edema and no tenderness.  Lymphadenopathy:    She has no cervical adenopathy.  Neurological: She is oriented to person, place, and time.  Skin: Skin is warm and dry. No rash noted. She is not diaphoretic. No erythema. No pallor.  Psychiatric: She has a normal mood and affect. Her behavior is normal. Judgment and thought content normal.     Lab Results  Component Value Date   WBC 12.1* 06/13/2011   HGB 13.9 06/13/2011   HCT 41.1 06/13/2011   PLT 319.0 06/13/2011   GLUCOSE 96 06/13/2011   CHOL 160 01/30/2010   TRIG 103 01/30/2010   HDL 38* 01/30/2010   LDLCALC 101* 01/30/2010   ALT 22 02/13/2011   AST 19 02/13/2011   NA 141 06/13/2011   K 4.2 06/13/2011   CL 106 06/13/2011   CREATININE 1.0 06/13/2011   BUN 11 06/13/2011   CO2 29 06/13/2011   TSH 0.96 03/28/2011   HGBA1C 7.0* 04/27/2011   MICROALBUR 0.50 01/30/2010       Assessment & Plan:

## 2011-08-24 ENCOUNTER — Telehealth: Payer: Self-pay

## 2011-08-24 NOTE — Telephone Encounter (Signed)
Ok, stop lipitor and f/up with me as directed

## 2011-08-24 NOTE — Telephone Encounter (Signed)
Patient called [facility] c/o leg cramps since starting lipitor. 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

## 2011-08-27 NOTE — Telephone Encounter (Signed)
Informed patient

## 2011-08-29 ENCOUNTER — Other Ambulatory Visit: Payer: Self-pay

## 2011-08-29 DIAGNOSIS — IMO0001 Reserved for inherently not codable concepts without codable children: Secondary | ICD-10-CM

## 2011-08-29 MED ORDER — SITAGLIPTIN PHOSPHATE 100 MG PO TABS: 100.0000 mg | ORAL_TABLET | Freq: Every day | ORAL | Status: DC

## 2011-10-26 ENCOUNTER — Other Ambulatory Visit: Payer: Self-pay

## 2011-10-26 DIAGNOSIS — I10 Essential (primary) hypertension: Secondary | ICD-10-CM

## 2011-10-26 MED ORDER — AZILSARTAN-CHLORTHALIDONE 40-25 MG PO TABS: 1.0000 | ORAL_TABLET | Freq: Every day | ORAL | Status: DC

## 2011-11-14 ENCOUNTER — Ambulatory Visit (INDEPENDENT_AMBULATORY_CARE_PROVIDER_SITE_OTHER): Payer: Medicare Other | Admitting: Internal Medicine

## 2011-11-14 ENCOUNTER — Other Ambulatory Visit (INDEPENDENT_AMBULATORY_CARE_PROVIDER_SITE_OTHER): Payer: Medicare Other

## 2011-11-14 ENCOUNTER — Encounter: Payer: Self-pay | Admitting: Internal Medicine

## 2011-11-14 VITALS — BP 100/58 | HR 76 | Temp 98.3°F | Resp 16 | Ht 62.0 in | Wt 249.5 lb

## 2011-11-14 DIAGNOSIS — I10 Essential (primary) hypertension: Secondary | ICD-10-CM

## 2011-11-14 DIAGNOSIS — D72829 Elevated white blood cell count, unspecified: Secondary | ICD-10-CM

## 2011-11-14 DIAGNOSIS — E876 Hypokalemia: Secondary | ICD-10-CM

## 2011-11-14 DIAGNOSIS — IMO0001 Reserved for inherently not codable concepts without codable children: Secondary | ICD-10-CM

## 2011-11-14 DIAGNOSIS — E669 Obesity, unspecified: Secondary | ICD-10-CM

## 2011-11-14 LAB — COMPREHENSIVE METABOLIC PANEL
ALT: 24 U/L (ref 0–35)
AST: 22 U/L (ref 0–37)
Alkaline Phosphatase: 67 U/L (ref 39–117)
BUN: 9 mg/dL (ref 6–23)
Calcium: 9.4 mg/dL (ref 8.4–10.5)
Creatinine, Ser: 0.9 mg/dL (ref 0.4–1.2)
Total Bilirubin: 0.5 mg/dL (ref 0.3–1.2)

## 2011-11-14 LAB — CBC WITH DIFFERENTIAL/PLATELET
Basophils Absolute: 0 10*3/uL (ref 0.0–0.1)
Basophils Relative: 0.3 % (ref 0.0–3.0)
Eosinophils Absolute: 0.3 10*3/uL (ref 0.0–0.7)
Hemoglobin: 13.7 g/dL (ref 12.0–15.0)
MCHC: 32.9 g/dL (ref 30.0–36.0)
MCV: 91.8 fl (ref 78.0–100.0)
Monocytes Absolute: 1 10*3/uL (ref 0.1–1.0)
Neutro Abs: 9.9 10*3/uL — ABNORMAL HIGH (ref 1.4–7.7)
RBC: 4.54 Mil/uL (ref 3.87–5.11)
RDW: 14.2 % (ref 11.5–14.6)

## 2011-11-14 LAB — HM DIABETES FOOT EXAM: HM Diabetic Foot Exam: NORMAL

## 2011-11-14 LAB — MAGNESIUM: Magnesium: 2 mg/dL (ref 1.5–2.5)

## 2011-11-14 MED ORDER — PHENTERMINE HCL 37.5 MG PO TABS: 37.5000 mg | ORAL_TABLET | Freq: Every day | ORAL | Status: DC

## 2011-11-14 NOTE — Patient Instructions (Signed)
Obesity Obesity is defined as having a body mass index (BMI) of 30 or more. To calculate your BMI divide your weight in pounds by your height in inches squared and multiply that product by 703. Major illnesses resulting from long-term obesity include:  Stroke.   Heart disease.   Diabetes.   Many cancers.   Arthritis.  Obesity also complicates recovery from many other medical problems.  CAUSES   A history of obesity in your parents.   Thyroid hormone imbalance.   Environmental factors such as excess calorie intake and physical inactivity.  TREATMENT  A healthy weight loss program includes:  A calorie restricted diet based on individual calorie needs.   Increased physical activity (exercise).  An exercise program is just as important as the right low-calorie diet.  Weight-loss medicines should be used only under the supervision of your physician. These medicines help, but only if they are used with diet and exercise programs. Medicines can have side effects including nervousness, nausea, abdominal pain, diarrhea, headache, drowsiness, and depression.  An unhealthy weight loss program includes:  Fasting.   Fad diets.   Supplements and drugs.  These choices do not succeed in long-term weight control.  HOME CARE INSTRUCTIONS  To help you make the needed dietary changes:   Exercise and perform physical activity as directed by your caregiver.   Keep a daily record of everything you eat. There are many free websites to help you with this. It may be helpful to measure your foods so you can determine if you are eating the correct portion sizes.   Use low-calorie cookbooks or take special cooking classes.   Avoid alcohol. Drink more water and drinks with no calories.   Take vitamins and supplements only as recommended by your caregiver.   Weight loss support groups, Registered Dieticians, counselors, and stress reduction education can also be very helpful.  Document Released:  06/07/2004 Document Revised: 04/19/2011 Document Reviewed: 04/06/2007 [facility] Patient Information 2012 [place].

## 2011-11-14 NOTE — Progress Notes (Signed)
Subjective:    Patient ID: April May, female    DOB: Dec 07, 1955, 56 y.o.   MRN: [id number]  Diabetes She presents for her follow-up diabetic visit. She has type 2 diabetes mellitus. Her disease course has been stable. There are no hypoglycemic associated symptoms. Pertinent negatives for diabetes include no blurred vision, no chest pain, no fatigue, no foot paresthesias, no foot ulcerations, no polydipsia, no polyphagia, no polyuria, no visual change, no weakness and no weight loss. There are no hypoglycemic complications. Symptoms are stable. There are no diabetic complications. Current diabetic treatment includes oral agent (monotherapy). She is compliant with treatment all of the time. Her weight is increasing steadily. She is following a generally unhealthy diet. When asked about meal planning, she reported none. She has not had a previous visit with a dietician. She participates in exercise intermittently. There is no change in her home blood glucose trend. An ACE inhibitor/angiotensin II receptor blocker is being taken. She does not see a podiatrist.Eye exam is not current.      Review of Systems  Constitutional: Positive for unexpected weight change (some weight gain). Negative for fever, chills, weight loss, diaphoresis, activity change, appetite change and fatigue.  HENT: Negative.   Eyes: Negative.  Negative for blurred vision.  Respiratory: Negative for cough, chest tightness, shortness of breath, wheezing and stridor.   Cardiovascular: Negative for chest pain, palpitations and leg swelling.  Gastrointestinal: Negative for nausea, vomiting, abdominal pain, diarrhea, constipation and abdominal distention.  Genitourinary: Negative.  Negative for polyuria.  Musculoskeletal: Negative.   Skin: Negative.   Neurological: Negative.  Negative for weakness.  Hematological: Negative for polydipsia, polyphagia and adenopathy. Does not bruise/bleed easily.       Objective:   Physical Exam  Vitals reviewed. Constitutional: She is oriented to person, place, and time. She appears well-developed and well-nourished. No distress.  HENT:  Head: Normocephalic and atraumatic.  Nose: Nose normal.  Mouth/Throat: Oropharynx is clear and moist. No oropharyngeal exudate.  Eyes: Conjunctivae are normal. Right eye exhibits no discharge. Left eye exhibits no discharge. No scleral icterus.  Neck: Normal range of motion. Neck supple. No JVD present. No tracheal deviation present. No thyromegaly present.  Cardiovascular: Normal rate, regular rhythm, normal heart sounds and intact distal pulses.  Exam reveals no gallop and no friction rub.   No murmur heard. Pulmonary/Chest: Effort normal and breath sounds normal. No stridor. No respiratory distress. She has no wheezes. She has no rales. She exhibits no tenderness.  Abdominal: Soft. Bowel sounds are normal. She exhibits no distension and no mass. There is no tenderness. There is no rebound and no guarding.  Musculoskeletal: Normal range of motion. She exhibits no edema and no tenderness.  Lymphadenopathy:    She has no cervical adenopathy.  Neurological: She is oriented to person, place, and time.  Skin: Skin is warm and dry. No rash noted. She is not diaphoretic. No erythema. No pallor.  Psychiatric: She has a normal mood and affect. Her behavior is normal. Judgment and thought content normal.      Lab Results  Component Value Date   WBC 12.3* 08/15/2011   HGB 13.6 08/15/2011   HCT 40.7 08/15/2011   PLT 335.0 08/15/2011   GLUCOSE 84 08/15/2011   CHOL 111 08/15/2011   TRIG 123.0 08/15/2011   HDL 34.00* 08/15/2011   LDLCALC 52 08/15/2011   ALT 25 08/15/2011   AST 23 08/15/2011   NA 141 08/15/2011   K 4.1 08/15/2011   CL  101 08/15/2011   CREATININE 1.0 08/15/2011   BUN 11 08/15/2011   CO2 31 08/15/2011   TSH 0.92 08/15/2011   HGBA1C 6.4 08/15/2011   MICROALBUR 0.50 01/30/2010      Assessment & Plan:

## 2011-11-15 NOTE — Assessment & Plan Note (Signed)
Her BP is well controlled, I will check her lytes and renal function 

## 2011-11-15 NOTE — Assessment & Plan Note (Signed)
I will check her a1c and will monitor her renal function 

## 2011-11-15 NOTE — Assessment & Plan Note (Addendum)
Recheck the K+ and MG++ levels today, she will continue with the same K+ replacement therapy

## 2011-11-15 NOTE — Assessment & Plan Note (Signed)
She will try adipex-p to help with weight loss

## 2011-11-15 NOTE — Assessment & Plan Note (Signed)
I will repeat her CBC today 

## 2011-11-29 LAB — HM DIABETES EYE EXAM

## 2012-01-16 ENCOUNTER — Telehealth: Payer: Self-pay | Admitting: Internal Medicine

## 2012-01-16 ENCOUNTER — Ambulatory Visit: Payer: Medicare Other | Admitting: Internal Medicine

## 2012-01-16 NOTE — Telephone Encounter (Signed)
The pt called and stated her mother was suddenly taken to the hospital this morning, so she missed her appointment.  She states other than needing a refill on her diet pills, she is feeling okay.  She is hoping a refill of the weight loss medication can be called into the pharmacy.    Thanks!

## 2012-02-15 ENCOUNTER — Other Ambulatory Visit (INDEPENDENT_AMBULATORY_CARE_PROVIDER_SITE_OTHER): Payer: Medicare Other

## 2012-02-15 ENCOUNTER — Ambulatory Visit (INDEPENDENT_AMBULATORY_CARE_PROVIDER_SITE_OTHER): Payer: Medicare Other | Admitting: Internal Medicine

## 2012-02-15 ENCOUNTER — Encounter: Payer: Self-pay | Admitting: Internal Medicine

## 2012-02-15 VITALS — BP 102/62 | HR 80 | Temp 98.2°F | Resp 16 | Wt 238.0 lb

## 2012-02-15 DIAGNOSIS — IMO0001 Reserved for inherently not codable concepts without codable children: Secondary | ICD-10-CM

## 2012-02-15 DIAGNOSIS — I10 Essential (primary) hypertension: Secondary | ICD-10-CM

## 2012-02-15 DIAGNOSIS — F3289 Other specified depressive episodes: Secondary | ICD-10-CM

## 2012-02-15 DIAGNOSIS — D72829 Elevated white blood cell count, unspecified: Secondary | ICD-10-CM

## 2012-02-15 DIAGNOSIS — E669 Obesity, unspecified: Secondary | ICD-10-CM

## 2012-02-15 DIAGNOSIS — Z23 Encounter for immunization: Secondary | ICD-10-CM

## 2012-02-15 DIAGNOSIS — F329 Major depressive disorder, single episode, unspecified: Secondary | ICD-10-CM

## 2012-02-15 LAB — CBC WITH DIFFERENTIAL/PLATELET
Basophils Absolute: 0.1 10*3/uL (ref 0.0–0.1)
Basophils Relative: 0.5 % (ref 0.0–3.0)
Eosinophils Absolute: 0.3 10*3/uL (ref 0.0–0.7)
Hemoglobin: 13.4 g/dL (ref 12.0–15.0)
Lymphocytes Relative: 22.7 % (ref 12.0–46.0)
MCHC: 32.7 g/dL (ref 30.0–36.0)
Monocytes Relative: 8.2 % (ref 3.0–12.0)
Neutro Abs: 7.5 10*3/uL (ref 1.4–7.7)
Neutrophils Relative %: 65.7 % (ref 43.0–77.0)
RBC: 4.43 Mil/uL (ref 3.87–5.11)
RDW: 14.4 % (ref 11.5–14.6)

## 2012-02-15 LAB — COMPREHENSIVE METABOLIC PANEL
ALT: 19 U/L (ref 0–35)
AST: 21 U/L (ref 0–37)
Albumin: 3.6 g/dL (ref 3.5–5.2)
CO2: 28 mEq/L (ref 19–32)
Calcium: 9.4 mg/dL (ref 8.4–10.5)
Chloride: 103 mEq/L (ref 96–112)
GFR: 96.71 mL/min (ref >60.00)
Potassium: 3.7 mEq/L (ref 3.5–5.1)
Total Protein: 7.2 g/dL (ref 6.0–8.3)

## 2012-02-15 LAB — HM DIABETES FOOT EXAM: HM Diabetic Foot Exam: NORMAL

## 2012-02-15 MED ORDER — LORCASERIN HCL 10 MG PO TABS: 1.0000 | ORAL_TABLET | Freq: Two times a day (BID) | ORAL | Status: DC

## 2012-02-15 NOTE — Progress Notes (Signed)
Subjective:    Patient ID: April May, female    DOB: 04-Nov-1955, 56 y.o.   MRN: [id number]  Diabetes She presents for her follow-up diabetic visit. She has type 2 diabetes mellitus. Her disease course has been stable. There are no hypoglycemic associated symptoms. Associated symptoms include fatigue. Pertinent negatives for diabetes include no blurred vision, no chest pain, no foot paresthesias, no foot ulcerations, no polydipsia, no polyphagia, no polyuria, no visual change, no weakness and no weight loss. There are no hypoglycemic complications. Symptoms are stable. There are no diabetic complications. When asked about current treatments, none were reported. She is compliant with treatment none of the time. She is following a generally healthy diet. When asked about meal planning, she reported none. She has not had a previous visit with a dietician. She never participates in exercise. There is no change in her home blood glucose trend. An ACE inhibitor/angiotensin II receptor blocker is being taken. She does not see a podiatrist.Eye exam is current.      Review of Systems  Constitutional: Positive for fatigue. Negative for fever, chills, weight loss, diaphoresis, appetite change and unexpected weight change.  HENT: Negative.   Eyes: Negative.  Negative for blurred vision.  Respiratory: Negative for cough, chest tightness, shortness of breath, wheezing and stridor.   Cardiovascular: Negative for chest pain, palpitations and leg swelling.  Gastrointestinal: Negative.   Genitourinary: Negative.  Negative for polyuria.  Musculoskeletal: Negative.   Skin: Negative.   Neurological: Negative.  Negative for weakness.  Hematological: Negative for polydipsia, polyphagia and adenopathy. Does not bruise/bleed easily.  Psychiatric/Behavioral: Negative.        Objective:   Physical Exam  Vitals reviewed. Constitutional: She is oriented to person, place, and time. She appears well-developed  and well-nourished. No distress.  HENT:  Head: Normocephalic and atraumatic.  Mouth/Throat: Oropharynx is clear and moist. No oropharyngeal exudate.  Eyes: Conjunctivae normal are normal. Right eye exhibits no discharge. Left eye exhibits no discharge. No scleral icterus.  Neck: Normal range of motion. Neck supple. No JVD present. No tracheal deviation present. No thyromegaly present.  Cardiovascular: Normal rate, regular rhythm, normal heart sounds and intact distal pulses.  Exam reveals no gallop and no friction rub.   No murmur heard. Pulmonary/Chest: Effort normal. No stridor. No respiratory distress. She has no wheezes. She has no rales. She exhibits no tenderness.  Abdominal: Soft. Bowel sounds are normal. She exhibits no distension and no mass. There is no tenderness. There is no rebound and no guarding.  Musculoskeletal: Normal range of motion. She exhibits no edema and no tenderness.  Lymphadenopathy:    She has no cervical adenopathy.  Neurological: She is oriented to person, place, and time.  Skin: Skin is warm and dry. No rash noted. She is not diaphoretic. No erythema. No pallor.  Psychiatric: She has a normal mood and affect. Her behavior is normal. Judgment and thought content normal.      Lab Results  Component Value Date   WBC 13.6* 11/14/2011   HGB 13.7 11/14/2011   HCT 41.7 11/14/2011   PLT 303.0 11/14/2011   GLUCOSE 128* 11/14/2011   CHOL 111 08/15/2011   TRIG 123.0 08/15/2011   HDL 34.00* 08/15/2011   LDLCALC 52 08/15/2011   ALT 24 11/14/2011   AST 22 11/14/2011   NA 139 11/14/2011   K 4.3 11/14/2011   CL 101 11/14/2011   CREATININE 0.9 11/14/2011   BUN 9 11/14/2011   CO2 30 11/14/2011  TSH 0.92 08/15/2011   HGBA1C 6.8* 11/14/2011   MICROALBUR 0.50 01/30/2010      Assessment & Plan:

## 2012-02-15 NOTE — Assessment & Plan Note (Signed)
She will try belviq to help her lose weight and control her appetite

## 2012-02-15 NOTE — Assessment & Plan Note (Signed)
I will check her a1c today and will address if needed

## 2012-02-15 NOTE — Assessment & Plan Note (Signed)
Repeat CBC today 

## 2012-02-15 NOTE — Assessment & Plan Note (Signed)
Her BP is well controlled, I will check her lytes and renal function today 

## 2012-02-15 NOTE — Patient Instructions (Signed)

## 2012-02-18 ENCOUNTER — Telehealth: Payer: Self-pay | Admitting: Internal Medicine

## 2012-02-18 NOTE — Telephone Encounter (Signed)
Patient notified/[facility] 

## 2012-02-18 NOTE — Telephone Encounter (Signed)
The patient called the triage line and wanted to report that her left arm (where she received her pneumonia shot) was swollen and red.  She is hoping to find out if this a normal reaction. She also wants to see what her lab results are for her blood work.   Thanks!

## 2012-02-18 NOTE — Telephone Encounter (Signed)
Yes, that is a normal reaction, her labs look real good

## 2012-04-12 ENCOUNTER — Encounter (HOSPITAL_COMMUNITY): Payer: Self-pay | Admitting: *Deleted

## 2012-04-12 ENCOUNTER — Emergency Department (HOSPITAL_COMMUNITY)
Admission: EM | Admit: 2012-04-12 | Discharge: 2012-04-13 | Disposition: A | Discharge status: Home / Self Care | Payer: Medicare Other | Attending: Emergency Medicine | Admitting: Emergency Medicine

## 2012-04-12 DIAGNOSIS — R109 Unspecified abdominal pain: Secondary | ICD-10-CM | POA: Insufficient documentation

## 2012-04-12 DIAGNOSIS — E119 Type 2 diabetes mellitus without complications: Secondary | ICD-10-CM | POA: Insufficient documentation

## 2012-04-12 DIAGNOSIS — M549 Dorsalgia, unspecified: Secondary | ICD-10-CM | POA: Insufficient documentation

## 2012-04-12 DIAGNOSIS — F3289 Other specified depressive episodes: Secondary | ICD-10-CM | POA: Insufficient documentation

## 2012-04-12 DIAGNOSIS — F329 Major depressive disorder, single episode, unspecified: Secondary | ICD-10-CM | POA: Insufficient documentation

## 2012-04-12 DIAGNOSIS — F17200 Nicotine dependence, unspecified, uncomplicated: Secondary | ICD-10-CM | POA: Insufficient documentation

## 2012-04-12 DIAGNOSIS — I10 Essential (primary) hypertension: Secondary | ICD-10-CM | POA: Insufficient documentation

## 2012-04-12 DIAGNOSIS — Z79899 Other long term (current) drug therapy: Secondary | ICD-10-CM | POA: Insufficient documentation

## 2012-04-12 DIAGNOSIS — K5090 Crohn's disease, unspecified, without complications: Secondary | ICD-10-CM

## 2012-04-13 ENCOUNTER — Emergency Department (HOSPITAL_COMMUNITY): Payer: Medicare Other

## 2012-04-13 LAB — URINALYSIS, ROUTINE W REFLEX MICROSCOPIC
Bilirubin Urine: NEGATIVE
Hgb urine dipstick: NEGATIVE
Ketones, ur: NEGATIVE mg/dL
Protein, ur: NEGATIVE mg/dL
Urobilinogen, UA: 0.2 mg/dL (ref 0.0–1.0)

## 2012-04-13 LAB — CBC WITH DIFFERENTIAL/PLATELET
Basophils Relative: 0 % (ref 0–1)
Eosinophils Absolute: 0.4 10*3/uL (ref 0.0–0.7)
Eosinophils Relative: 3 % (ref 0–5)
Lymphs Abs: 4.8 10*3/uL — ABNORMAL HIGH (ref 0.7–4.0)
MCH: 30.6 pg (ref 26.0–34.0)
MCHC: 33.1 g/dL (ref 30.0–36.0)
MCV: 92.4 fL (ref 78.0–100.0)
Platelets: 344 10*3/uL (ref 150–400)
RDW: 14.1 % (ref 11.5–15.5)

## 2012-04-13 LAB — COMPREHENSIVE METABOLIC PANEL
ALT: 15 U/L (ref 0–35)
Calcium: 9.9 mg/dL (ref 8.4–10.5)
GFR calc Af Amer: 76 mL/min — ABNORMAL LOW (ref >90)
Glucose, Bld: 107 mg/dL — ABNORMAL HIGH (ref 70–99)
Sodium: 138 mEq/L (ref 135–145)
Total Protein: 7.5 g/dL (ref 6.0–8.3)

## 2012-04-13 MED ORDER — PREDNISONE 20 MG PO TABS: ORAL_TABLET | ORAL | Status: DC

## 2012-04-13 MED ORDER — METHYLPREDNISOLONE SODIUM SUCC 125 MG IJ SOLR
125.0000 mg | Freq: Once | INTRAMUSCULAR | Status: AC
  Administered 2012-04-13: 125 mg via INTRAVENOUS
  Filled 2012-04-13: qty 2

## 2012-04-13 MED ORDER — ONDANSETRON HCL 4 MG PO TABS: 4.0000 mg | ORAL_TABLET | Freq: Four times a day (QID) | ORAL | Status: DC

## 2012-04-13 MED ORDER — HYDROCODONE-ACETAMINOPHEN 5-500 MG PO TABS: 1.0000 | ORAL_TABLET | Freq: Four times a day (QID) | ORAL | Status: DC | PRN

## 2012-04-13 MED ORDER — IOHEXOL 300 MG/ML  SOLN
100.0000 mL | Freq: Once | INTRAMUSCULAR | Status: AC | PRN
  Administered 2012-04-13: 100 mL via INTRAVENOUS

## 2012-04-13 MED ORDER — MORPHINE SULFATE 4 MG/ML IJ SOLN
4.0000 mg | Freq: Once | INTRAMUSCULAR | Status: AC
  Administered 2012-04-13: 4 mg via INTRAVENOUS
  Filled 2012-04-13: qty 1

## 2012-04-13 MED ORDER — PREDNISONE 5 MG PO TABS: ORAL_TABLET | ORAL | Status: DC

## 2012-04-13 NOTE — ED Provider Notes (Signed)
History     CSN: [id number]  Arrival date & time 04/12/12  2341   First MD Initiated Contact with Patient 04/13/12 0139      Chief Complaint  Patient presents with  . Abdominal Pain  . Back Pain    (Consider location/radiation/quality/duration/timing/severity/associated sxs/prior treatment) HPI 56 year old female presents emergency apartment complaining of lower abdominal pain with radiation into her back for the last 5-7 days. Pain has gotten progressively worse. She notes pain in her belly when she is moving around and sitting up. She is having normal bowel movements. She denies any fever or chills no nausea or vomiting. She has had urinary frequency over the last few days, but no burning with urination. Patient with past history of Crohn's, reports her last flare was several years ago. She's been very stable off medication since that time. This pain does, however feel somewhat like a Crohn's flare. She is also worried about her kidneys as she does not normally have pain into her back. Past Medical History  Diagnosis Date  . Allergy   . Depression   . Hypertension   . Crohn disease   . Diabetes mellitus type II     Past Surgical History  Procedure Date  . Appendectomy   . Abdominal hysterectomy   . Small intestine surgery   . Breast surgery     Family History  Problem Relation Age of Onset  . Hypertension Mother   . Arthritis Father   . Alcohol abuse Paternal Uncle   . Diabetes Paternal Grandmother   . Stroke Paternal Grandmother     History  Substance Use Topics  . Smoking status: Current Every Day Smoker -- 1.0 packs/day for 35 years    Types: Cigarettes  . Smokeless tobacco: Never Used  . Alcohol Use: No    OB History    Grav Para Term Preterm Abortions TAB SAB Ect Mult Living                  Review of Systems  See History of Present Illness; otherwise all other systems are reviewed and negative  Allergies  Lisinopril; Crestor; Lipitor; and  Azathioprine  Home Medications   Current Outpatient Rx  Name  Route  Sig  Dispense  Refill  . AZILSARTAN-CHLORTHALIDONE 40-25 MG PO TABS   Oral   Take 1 tablet by mouth daily.   90 tablet   3   . BUPROPION HCL ER (SR) 100 MG PO TB12   Oral   Take 100 mg by mouth daily.         Marland Kitchen LAMOTRIGINE 200 MG PO TABS   Oral   Take 200 mg by mouth at bedtime.         . SERTRALINE HCL 50 MG PO TABS   Oral   Take 100 mg by mouth daily.          Marland Kitchen HYDROCODONE-ACETAMINOPHEN 5-500 MG PO TABS   Oral   Take 1-2 tablets by mouth every 6 (six) hours as needed for pain.   15 tablet   0   . ONDANSETRON HCL 4 MG PO TABS   Oral   Take 1 tablet (4 mg total) by mouth every 6 (six) hours. PRN nausea   12 tablet   0   . PREDNISONE 20 MG PO TABS      40 mg po q day for 2 weeks, then 20 mg po qd for 2 weeks then go to dosing on 5 mg bottle  35 tablet   0   . PREDNISONE 5 MG PO TABS      10 mg po qd for 2 weeks then 5 mg po q day for 2 weeks   35 tablet   0     BP 118/58  Pulse 72  Temp 98.7 F (37.1 C) (Oral)  Resp 18  Ht 5\' 2"  (1.575 m)  Wt 240 lb (108.863 kg)  BMI 43.90 kg/m2  SpO2 100%  LMP 05/14/1988  Physical Exam  Nursing note and vitals reviewed. Constitutional: She is oriented to person, place, and time. She appears well-developed and well-nourished.       Morbidly obese female who appears anxious and in pain.  HENT:  Head: Normocephalic and atraumatic.  Right Ear: External ear normal.  Left Ear: External ear normal.  Nose: Nose normal.  Mouth/Throat: Oropharynx is clear and moist.  Eyes: Conjunctivae normal and EOM are normal. Pupils are equal, round, and reactive to light.  Neck: Normal range of motion. Neck supple. No JVD present. No tracheal deviation present. No thyromegaly present.  Cardiovascular: Normal rate, regular rhythm, normal heart sounds and intact distal pulses.  Exam reveals no gallop and no friction rub.   No murmur heard. Pulmonary/Chest:  Effort normal and breath sounds normal. No stridor. No respiratory distress. She has no wheezes. She has no rales. She exhibits no tenderness.  Abdominal: Soft. Bowel sounds are normal. She exhibits no distension and no mass. There is tenderness (Moderate tenderness over bladder and bilateral lower quadrants without rebound or guarding). There is no rebound and no guarding.  Musculoskeletal: Normal range of motion. She exhibits no edema and no tenderness.  Lymphadenopathy:    She has no cervical adenopathy.  Neurological: She is alert and oriented to person, place, and time. No cranial nerve deficit. She exhibits normal muscle tone. Coordination normal.  Skin: Skin is warm and dry. No rash noted. No erythema. No pallor.  Psychiatric: She has a normal mood and affect. Her behavior is normal. Judgment and thought content normal.    ED Course  Procedures (including critical care time)  Labs Reviewed  URINALYSIS, ROUTINE W REFLEX MICROSCOPIC - Abnormal; Notable for the following:    APPearance CLOUDY (*)     All other components within normal limits  CBC WITH DIFFERENTIAL - Abnormal; Notable for the following:    WBC 13.2 (*)     Lymphs Abs 4.8 (*)     All other components within normal limits  COMPREHENSIVE METABOLIC PANEL - Abnormal; Notable for the following:    Glucose, Bld 107 (*)     Total Bilirubin 0.2 (*)     GFR calc non Af Amer 66 (*)     GFR calc Af Amer 76 (*)     All other components within normal limits   Ct Abdomen Pelvis W Contrast  04/13/2012  *RADIOLOGY REPORT*  Clinical Data: Lower abdominal pain; history of Crohn's disease. Leukocytosis.  CT ABDOMEN AND PELVIS WITH CONTRAST  Technique:  Multidetector CT imaging of the abdomen and pelvis was performed following the standard protocol during bolus administration of intravenous contrast.  Contrast: OMNIPAQUE IOHEXOL 300 MG/ML  SOLN  Comparison: CT of the abdomen and pelvis performed 02/13/2011  Findings: Minimal  bibasilar atelectasis is noted.  Scattered small hypodensities within the liver are nonspecific, measuring up to 0.9 cm, but may reflect a small cyst.  The liver is otherwise unremarkable in appearance.  The spleen is within normal limits.  The gallbladder  is unremarkable.  The pancreas and adrenal glands are normal in appearance.  The kidneys are unremarkable in appearance.  There is no evidence of hydronephrosis.  No renal or ureteral stones are seen.  No perinephric stranding is appreciated.  The stomach is within normal limits.  No acute vascular abnormalities are seen.  Scattered calcification is noted along the abdominal aorta and its branches.  There is focal soft tissue inflammation at the right lower quadrant, surrounding the distal ileum, with trace associated free fluid and mild small bowel wall thickening.  Findings are compatible with mild acute exacerbation of the patient's Crohn's disease.  The adjacent ileocolic anastomosis is grossly unremarkable in appearance.  The remaining colon is unremarkable.  The bladder is mildly distended and grossly unremarkable in appearance.  The patient is status post hysterectomy.  No suspicious adnexal masses are seen.  No inguinal lymphadenopathy is seen.  No acute osseous abnormalities are identified.  Sclerotic change is noted at the right side of the pubic symphysis, likely degenerative in nature.  IMPRESSION:  1.  Focal soft tissue inflammation at the right lower quadrant, surrounding the distal ileum, with trace associated free fluid and mild small bowel wall thickening.  Findings compatible with mild acute exacerbation of the patient's Crohn's disease. 2.  The ileocolic anastomosis is unremarkable in appearance. 3.  Scattered calcification along the abdominal aorta and its branches. 4.  Likely tiny hepatic cysts.   Original Report Authenticated By: Tonia Ghent, M.D.      1. Abdominal pain   2. Exacerbation of Crohn's disease       MDM  56 year old  female with lower abdominal pain. Differential included urinary tract infection, Crohn's flare. Urine shows no signs of infection. Leukocytosis noted. CT scan with inflammation of the right lower quadrant around the distal ileum consistent with a acute exacerbation of Crohn's. Patient updated on findings. Her pain has been controlled. Patient be given steroids. She reports that her [facility] Dr. recommended that she not take steroid pills due to "the hump on my back". Will have patient call Dr. Elnoria Howard on Monday to discuss a steroid taper which is what she's had in the past.        April Mackie, MD 04/14/12 [phone]

## 2012-04-13 NOTE — ED Notes (Signed)
Pt verbalizes understanding 

## 2012-04-13 NOTE — ED Notes (Signed)
Pt present with c/o abd pain"for a couple days"  Pt hx crohns disease.  Pt reports "feels like it could be a flare up"  Pt last BM today was normal.  Pt denies n/v or chills/fever.  Pt reports urinary frequency a couple days ago.

## 2012-04-17 ENCOUNTER — Ambulatory Visit: Payer: Medicare Other | Admitting: Internal Medicine

## 2012-04-21 ENCOUNTER — Other Ambulatory Visit: Payer: Self-pay | Admitting: Internal Medicine

## 2012-04-21 DIAGNOSIS — Z1231 Encounter for screening mammogram for malignant neoplasm of breast: Secondary | ICD-10-CM

## 2012-04-24 ENCOUNTER — Ambulatory Visit (INDEPENDENT_AMBULATORY_CARE_PROVIDER_SITE_OTHER)
Admission: RE | Admit: 2012-04-24 | Discharge: 2012-04-24 | Disposition: A | Discharge status: Home / Self Care | Payer: Medicare Other | Source: Ambulatory Visit | Attending: Internal Medicine | Admitting: Internal Medicine

## 2012-04-24 ENCOUNTER — Ambulatory Visit (INDEPENDENT_AMBULATORY_CARE_PROVIDER_SITE_OTHER): Payer: Medicare Other | Admitting: Internal Medicine

## 2012-04-24 ENCOUNTER — Encounter: Payer: Self-pay | Admitting: Internal Medicine

## 2012-04-24 ENCOUNTER — Other Ambulatory Visit: Payer: Self-pay | Admitting: Internal Medicine

## 2012-04-24 ENCOUNTER — Other Ambulatory Visit (INDEPENDENT_AMBULATORY_CARE_PROVIDER_SITE_OTHER): Payer: Medicare Other

## 2012-04-24 VITALS — BP 102/62 | HR 78 | Temp 98.4°F | Resp 16 | Wt 239.2 lb

## 2012-04-24 DIAGNOSIS — K5090 Crohn's disease, unspecified, without complications: Secondary | ICD-10-CM

## 2012-04-24 DIAGNOSIS — D72829 Elevated white blood cell count, unspecified: Secondary | ICD-10-CM

## 2012-04-24 DIAGNOSIS — M545 Low back pain, unspecified: Secondary | ICD-10-CM

## 2012-04-24 LAB — CBC WITH DIFFERENTIAL/PLATELET
Basophils Absolute: 0 10*3/uL (ref 0.0–0.1)
Basophils Relative: 0.2 % (ref 0.0–3.0)
Hemoglobin: 14.9 g/dL (ref 12.0–15.0)
Lymphocytes Relative: 13.4 % (ref 12.0–46.0)
Monocytes Relative: 4.5 % (ref 3.0–12.0)
Neutro Abs: 13.4 10*3/uL — ABNORMAL HIGH (ref 1.4–7.7)
RBC: 4.85 Mil/uL (ref 3.87–5.11)
RDW: 14.4 % (ref 11.5–14.6)

## 2012-04-24 LAB — URINALYSIS, ROUTINE W REFLEX MICROSCOPIC
Hgb urine dipstick: NEGATIVE
Leukocytes, UA: NEGATIVE
Nitrite: NEGATIVE
Total Protein, Urine: NEGATIVE
Urobilinogen, UA: 0.2 (ref 0.0–1.0)

## 2012-04-24 MED ORDER — HYDROCODONE-ACETAMINOPHEN 5-325 MG PO TABS: 1.0000 | ORAL_TABLET | Freq: Four times a day (QID) | ORAL | Status: DC | PRN

## 2012-04-24 NOTE — Assessment & Plan Note (Signed)
I will recheck plain films of her low back today Will also check her UDS to see that she is compliant with her current meds and to screen for substance abuse I will look at her CBC ESR CRP to see if she has an infectious or inflammatory condition in her lower back

## 2012-04-24 NOTE — Assessment & Plan Note (Signed)
I will recheck her CBC and will look at an SPEP as well, I will check her ESR and CRP to see if she has an inflammatory condition

## 2012-04-24 NOTE — Progress Notes (Signed)
Subjective:    Patient ID: April May, female    DOB: 1956-04-26, 56 y.o.   MRN: [id number]  Back Pain This is a recurrent problem. The current episode started 1 to 4 weeks ago. The problem occurs constantly. The problem is unchanged. The pain is present in the lumbar spine. The quality of the pain is described as aching. The pain does not radiate. The pain is at a severity of 4/10. The pain is moderate. The pain is worse during the day. The symptoms are aggravated by bending and position. Pertinent negatives include no abdominal pain, bladder incontinence, bowel incontinence, chest pain, dysuria, fever, headaches, leg pain, numbness, paresis, paresthesias, pelvic pain, perianal numbness, tingling, weakness or weight loss. Risk factors include obesity and lack of exercise. She has tried analgesics for the symptoms. The treatment provided significant relief.      Review of Systems  Constitutional: Negative for fever, chills, weight loss, diaphoresis, activity change, appetite change, fatigue and unexpected weight change.  HENT: Negative.   Eyes: Negative.   Respiratory: Negative for cough, chest tightness, shortness of breath, wheezing and stridor.   Cardiovascular: Negative for chest pain, palpitations and leg swelling.  Gastrointestinal: Negative for nausea, vomiting, abdominal pain, diarrhea, constipation, blood in stool, abdominal distention, anal bleeding, rectal pain and bowel incontinence.  Genitourinary: Negative for bladder incontinence, dysuria, urgency, frequency, hematuria, flank pain, decreased urine volume, vaginal bleeding, vaginal discharge, enuresis, difficulty urinating, genital sores, vaginal pain, menstrual problem, pelvic pain and dyspareunia.  Musculoskeletal: Positive for back pain. Negative for myalgias, joint swelling, arthralgias and gait problem.  Skin: Negative for color change, pallor, rash and wound.  Neurological: Negative.  Negative for tingling, weakness,  numbness, headaches and paresthesias.  Hematological: Negative for adenopathy. Does not bruise/bleed easily.  Psychiatric/Behavioral: Negative.        Objective:   Physical Exam  Vitals reviewed. Constitutional: She is oriented to person, place, and time. She appears well-developed and well-nourished. No distress.  HENT:  Head: Normocephalic and atraumatic.  Mouth/Throat: Oropharynx is clear and moist. No oropharyngeal exudate.  Eyes: Conjunctivae normal are normal. Right eye exhibits no discharge. Left eye exhibits no discharge. No scleral icterus.  Neck: Normal range of motion. Neck supple. No JVD present. No tracheal deviation present. No thyromegaly present.  Cardiovascular: Normal rate, regular rhythm, normal heart sounds and intact distal pulses.  Exam reveals no gallop and no friction rub.   No murmur heard. Pulmonary/Chest: Effort normal and breath sounds normal. No stridor. No respiratory distress. She has no wheezes. She has no rales. She exhibits no tenderness.  Abdominal: Soft. Bowel sounds are normal. She exhibits no distension and no mass. There is no tenderness. There is no rebound and no guarding.  Musculoskeletal: Normal range of motion. She exhibits no edema and no tenderness.       Lumbar back: Normal. She exhibits normal range of motion, no tenderness, no bony tenderness, no swelling, no edema, no deformity, no laceration, no pain, no spasm and normal pulse.  Lymphadenopathy:    She has no cervical adenopathy.  Neurological: She is alert and oriented to person, place, and time. She has normal strength. She displays no atrophy, no tremor and normal reflexes. No cranial nerve deficit or sensory deficit. She exhibits normal muscle tone. She displays a negative Romberg sign. She displays no seizure activity. Coordination and gait normal. She displays no Babinski's sign on the right side. She displays no Babinski's sign on the left side.  Reflex Scores:  Tricep reflexes  are 1+ on the right side and 1+ on the left side.      Bicep reflexes are 1+ on the left side.      Brachioradialis reflexes are 1+ on the right side and 1+ on the left side.      Patellar reflexes are 1+ on the right side and 1+ on the left side.      Achilles reflexes are 1+ on the right side and 1+ on the left side.      - SLR in BLE  Skin: Skin is warm and dry. No rash noted. She is not diaphoretic. No erythema. No pallor.  Psychiatric: She has a normal mood and affect. Her behavior is normal. Judgment and thought content normal.      Lab Results  Component Value Date   WBC 13.2* 04/13/2012   HGB 14.0 04/13/2012   HCT 42.3 04/13/2012   PLT 344 04/13/2012   GLUCOSE 107* 04/13/2012   CHOL 111 08/15/2011   TRIG 123.0 08/15/2011   HDL 34.00* 08/15/2011   LDLCALC 52 08/15/2011   ALT 15 04/13/2012   AST 18 04/13/2012   NA 138 04/13/2012   K 3.5 04/13/2012   CL 100 04/13/2012   CREATININE 0.95 04/13/2012   BUN 18 04/13/2012   CO2 28 04/13/2012   TSH 0.92 08/15/2011   HGBA1C 6.7* 02/15/2012   MICROALBUR 0.50 01/30/2010      Assessment & Plan:

## 2012-04-24 NOTE — Assessment & Plan Note (Signed)
No s/s noted today 

## 2012-04-24 NOTE — Patient Instructions (Signed)
Back Pain, Adult Low back pain is very common. About 1 in 5 people have back pain.The cause of low back pain is rarely dangerous. The pain often gets better over time.About half of people with a sudden onset of back pain feel better in just 2 weeks. About 8 in 10 people feel better by 6 weeks.  CAUSES Some common causes of back pain include:  Strain of the muscles or ligaments supporting the spine.  Wear and tear (degeneration) of the spinal discs.  Arthritis.  Direct injury to the back. DIAGNOSIS Most of the time, the direct cause of low back pain is not known.However, back pain can be treated effectively even when the exact cause of the pain is unknown.Answering your caregiver's questions about your overall health and symptoms is one of the most accurate ways to make sure the cause of your pain is not dangerous. If your caregiver needs more information, he or she may order lab work or imaging tests (X-rays or MRIs).However, even if imaging tests show changes in your back, this usually does not require surgery. HOME CARE INSTRUCTIONS For many people, back pain returns.Since low back pain is rarely dangerous, it is often a condition that people can learn to [facility] their own.   Remain active. It is stressful on the back to sit or stand in one place. Do not sit, drive, or stand in one place for more than 30 minutes at a time. Take short walks on level surfaces as soon as pain allows.Try to increase the length of time you walk each day.  Do not stay in bed.Resting more than 1 or 2 days can delay your recovery.  Do not avoid exercise or work.Your body is made to move.It is not dangerous to be active, even though your back may hurt.Your back will likely heal faster if you return to being active before your pain is gone.  Pay attention to your body when you bend and lift. Many people have less discomfortwhen lifting if they bend their knees, keep the load close to their bodies,and  avoid twisting. Often, the most comfortable positions are those that put less stress on your recovering back.  Find a comfortable position to sleep. Use a firm mattress and lie on your side with your knees slightly bent. If you lie on your back, put a pillow under your knees.  Only take over-the-counter or prescription medicines as directed by your caregiver. Over-the-counter medicines to reduce pain and inflammation are often the most helpful.Your caregiver may prescribe muscle relaxant drugs.These medicines help dull your pain so you can more quickly return to your normal activities and healthy exercise.  Put ice on the injured area.  Put ice in a plastic bag.  Place a towel between your skin and the bag.  Leave the ice on for 15 to 20 minutes, 3 to 4 times a day for the first 2 to 3 days. After that, ice and heat may be alternated to reduce pain and spasms.  Ask your caregiver about trying back exercises and gentle massage. This may be of some benefit.  Avoid feeling anxious or stressed.Stress increases muscle tension and can worsen back pain.It is important to recognize when you are anxious or stressed and learn ways to manage it.Exercise is a great option. SEEK MEDICAL CARE IF:  You have pain that is not relieved with rest or medicine.  You have pain that does not improve in 1 week.  You have new symptoms.  You are generally  not feeling well. SEEK IMMEDIATE MEDICAL CARE IF:   You have pain that radiates from your back into your legs.  You develop new bowel or bladder control problems.  You have unusual weakness or numbness in your arms or legs.  You develop nausea or vomiting.  You develop abdominal pain.  You feel faint. Document Released: 04/30/2005 Document Revised: 10/30/2011 Document Reviewed: 09/18/2010 [facility] Patient Information 2013 [place].

## 2012-04-25 LAB — DRUGS OF ABUSE SCREEN W/O ALC, ROUTINE URINE
Barbiturate Quant, Ur: NEGATIVE
Marijuana Metabolite: NEGATIVE
Methadone: NEGATIVE
Opiate Screen, Urine: NEGATIVE
Propoxyphene: NEGATIVE

## 2012-04-28 LAB — PROTEIN ELECTROPHORESIS, SERUM
Alpha-2-Globulin: 11.4 % (ref 7.1–11.8)
Beta 2: 5.4 % (ref 3.2–6.5)
Beta Globulin: 6 % (ref 4.7–7.2)
Gamma Globulin: 19 % — ABNORMAL HIGH (ref 11.1–18.8)

## 2012-04-29 ENCOUNTER — Telehealth: Payer: Self-pay | Admitting: *Deleted

## 2012-04-29 NOTE — Telephone Encounter (Signed)
PATIENT CALLED AND WOULD LIKE TO KNOW WHAT HER LAB RESULTS ARE. PLEASE ADVISE.

## 2012-04-30 NOTE — Telephone Encounter (Signed)
WBC count is still elevated but the other labs look good

## 2012-05-09 ENCOUNTER — Ambulatory Visit (HOSPITAL_COMMUNITY)
Admission: RE | Admit: 2012-05-09 | Discharge: 2012-05-09 | Disposition: A | Discharge status: Home / Self Care | Payer: Medicare Other | Source: Ambulatory Visit | Attending: Internal Medicine | Admitting: Internal Medicine

## 2012-05-09 DIAGNOSIS — Z1231 Encounter for screening mammogram for malignant neoplasm of breast: Secondary | ICD-10-CM | POA: Insufficient documentation

## 2012-08-28 ENCOUNTER — Other Ambulatory Visit: Payer: Self-pay | Admitting: Internal Medicine

## 2012-09-03 ENCOUNTER — Telehealth: Payer: Self-pay

## 2012-09-03 DIAGNOSIS — J309 Allergic rhinitis, unspecified: Secondary | ICD-10-CM

## 2012-09-03 MED ORDER — METHYLPREDNISOLONE 4 MG PO KIT: PACK | ORAL | Status: DC

## 2012-09-03 MED ORDER — CETIRIZINE HCL 10 MG PO TABS: 10.0000 mg | ORAL_TABLET | Freq: Every day | ORAL | Status: DC

## 2012-09-03 NOTE — Telephone Encounter (Signed)
Pt c/o cough, congestion, runny nose, watery eyes, itching throat and eyes. Pt request a generic medication to help with allergies. Thanks

## 2012-09-03 NOTE — Telephone Encounter (Signed)
done

## 2012-12-03 ENCOUNTER — Other Ambulatory Visit: Payer: Self-pay | Admitting: Internal Medicine

## 2013-01-14 ENCOUNTER — Ambulatory Visit (INDEPENDENT_AMBULATORY_CARE_PROVIDER_SITE_OTHER): Payer: Medicare Other | Admitting: Internal Medicine

## 2013-01-14 ENCOUNTER — Encounter: Payer: Self-pay | Admitting: Internal Medicine

## 2013-01-14 ENCOUNTER — Other Ambulatory Visit (INDEPENDENT_AMBULATORY_CARE_PROVIDER_SITE_OTHER): Payer: Medicare Other

## 2013-01-14 VITALS — BP 126/78 | HR 76 | Temp 98.8°F | Resp 16 | Wt 230.0 lb

## 2013-01-14 DIAGNOSIS — D72829 Elevated white blood cell count, unspecified: Secondary | ICD-10-CM

## 2013-01-14 DIAGNOSIS — I10 Essential (primary) hypertension: Secondary | ICD-10-CM

## 2013-01-14 DIAGNOSIS — J019 Acute sinusitis, unspecified: Secondary | ICD-10-CM

## 2013-01-14 DIAGNOSIS — IMO0001 Reserved for inherently not codable concepts without codable children: Secondary | ICD-10-CM

## 2013-01-14 DIAGNOSIS — E7800 Pure hypercholesterolemia, unspecified: Secondary | ICD-10-CM

## 2013-01-14 DIAGNOSIS — B9689 Other specified bacterial agents as the cause of diseases classified elsewhere: Secondary | ICD-10-CM | POA: Insufficient documentation

## 2013-01-14 DIAGNOSIS — R1310 Dysphagia, unspecified: Secondary | ICD-10-CM | POA: Insufficient documentation

## 2013-01-14 LAB — CBC WITH DIFFERENTIAL/PLATELET
Eosinophils Absolute: 0.2 10*3/uL (ref 0.0–0.7)
Eosinophils Relative: 1.8 % (ref 0.0–5.0)
HCT: 41.2 % (ref 36.0–46.0)
Lymphs Abs: 3.4 10*3/uL (ref 0.7–4.0)
MCHC: 34 g/dL (ref 30.0–36.0)
MCV: 90.4 fl (ref 78.0–100.0)
Monocytes Absolute: 0.8 10*3/uL (ref 0.1–1.0)
Platelets: 335 10*3/uL (ref 150.0–400.0)
RDW: 14.3 % (ref 11.5–14.6)
WBC: 10.6 10*3/uL — ABNORMAL HIGH (ref 4.5–10.5)

## 2013-01-14 LAB — HEMOGLOBIN A1C: Hgb A1c MFr Bld: 6.5 % (ref 4.6–6.5)

## 2013-01-14 MED ORDER — AMOXICILLIN 875 MG PO TABS: 875.0000 mg | ORAL_TABLET | Freq: Two times a day (BID) | ORAL | Status: DC

## 2013-01-14 NOTE — Progress Notes (Signed)
Subjective:    Patient ID: April May, female    DOB: 01-Nov-1955, 57 y.o.   MRN: [id number]  Sinusitis This is a new problem. The current episode started in the past 7 days. The problem has been gradually worsening since onset. There has been no fever. Her pain is at a severity of 0/10. She is experiencing no pain. Associated symptoms include congestion and sinus pressure. Pertinent negatives include no chills, coughing, diaphoresis, ear pain, headaches, hoarse voice, neck pain, shortness of breath, sneezing, sore throat or swollen glands. Past treatments include nothing.      Review of Systems  Constitutional: Negative.  Negative for fever, chills, diaphoresis, activity change, appetite change, fatigue and unexpected weight change.  HENT: Positive for congestion, rhinorrhea, trouble swallowing (she feels like food gets stuck in her esophagus), voice change (mild laryngitis), postnasal drip and sinus pressure. Negative for ear pain, sore throat, hoarse voice, facial swelling, sneezing, drooling, mouth sores, neck pain and dental problem.   Eyes: Negative.   Respiratory: Negative.  Negative for cough, choking, chest tightness, shortness of breath, wheezing and stridor.   Cardiovascular: Negative.  Negative for chest pain, palpitations and leg swelling.  Gastrointestinal: Negative.  Negative for nausea, abdominal pain, diarrhea, constipation and blood in stool.  Endocrine: Negative.   Genitourinary: Negative.   Musculoskeletal: Negative.   Skin: Negative.   Allergic/Immunologic: Negative.   Neurological: Negative.  Negative for dizziness, tremors, weakness, light-headedness and headaches.  Hematological: Negative.  Negative for adenopathy. Does not bruise/bleed easily.  Psychiatric/Behavioral: Negative.        Objective:   Physical Exam  Vitals reviewed. Constitutional: She is oriented to person, place, and time. She appears well-developed and well-nourished. No distress.   HENT:  Head: Normocephalic and atraumatic.  Right Ear: Hearing, tympanic membrane, external ear and ear canal normal.  Left Ear: Hearing, tympanic membrane, external ear and ear canal normal.  Nose: Mucosal edema and rhinorrhea present. No sinus tenderness. No epistaxis.  No foreign bodies. Right sinus exhibits no maxillary sinus tenderness and no frontal sinus tenderness. Left sinus exhibits no maxillary sinus tenderness and no frontal sinus tenderness.  Mouth/Throat: Oropharynx is clear and moist and mucous membranes are normal. Mucous membranes are not pale, not dry and not cyanotic. No oral lesions. No trismus in the jaw. No edematous. No oropharyngeal exudate, posterior oropharyngeal edema, posterior oropharyngeal erythema or tonsillar abscesses.  Eyes: Conjunctivae are normal. Right eye exhibits no discharge. Left eye exhibits no discharge. No scleral icterus.  Neck: Normal range of motion. Neck supple. No JVD present. No tracheal deviation present. No thyromegaly present.  Cardiovascular: Normal rate, regular rhythm, normal heart sounds and intact distal pulses.  Exam reveals no gallop and no friction rub.   No murmur heard. Pulmonary/Chest: Effort normal and breath sounds normal. No stridor. No respiratory distress. She has no wheezes. She has no rales. She exhibits no tenderness.  Abdominal: Soft. Bowel sounds are normal. She exhibits no distension and no mass. There is no tenderness. There is no rebound and no guarding.  Musculoskeletal: Normal range of motion. She exhibits no edema and no tenderness.  Lymphadenopathy:    She has no cervical adenopathy.  Neurological: She is oriented to person, place, and time.  Skin: Skin is warm and dry. No rash noted. She is not diaphoretic. No erythema. No pallor.  Psychiatric: Her behavior is normal. Judgment and thought content normal.     Lab Results  Component Value Date   WBC 16.4* 04/24/2012  HGB 14.9 04/24/2012   HCT 44.6 04/24/2012    PLT 370.0 04/24/2012   GLUCOSE 107* 04/13/2012   CHOL 111 08/15/2011   TRIG 123.0 08/15/2011   HDL 34.00* 08/15/2011   LDLCALC 52 08/15/2011   ALT 15 04/13/2012   AST 18 04/13/2012   NA 138 04/13/2012   K 3.5 04/13/2012   CL 100 04/13/2012   CREATININE 0.95 04/13/2012   BUN 18 04/13/2012   CO2 28 04/13/2012   TSH 0.92 08/15/2011   HGBA1C 6.7* 02/15/2012   MICROALBUR 0.50 01/30/2010       Assessment & Plan:

## 2013-01-14 NOTE — Patient Instructions (Signed)

## 2013-01-15 ENCOUNTER — Encounter: Payer: Self-pay | Admitting: Internal Medicine

## 2013-01-15 LAB — COMPREHENSIVE METABOLIC PANEL WITH GFR
ALT: 14 U/L (ref 0–35)
AST: 17 U/L (ref 0–37)
Albumin: 4.1 g/dL (ref 3.5–5.2)
Alkaline Phosphatase: 62 U/L (ref 39–117)
BUN: 12 mg/dL (ref 6–23)
CO2: 32 meq/L (ref 19–32)
Calcium: 9.6 mg/dL (ref 8.4–10.5)
Chloride: 104 meq/L (ref 96–112)
Creatinine, Ser: 1.1 mg/dL (ref 0.4–1.2)
GFR: 67.2 mL/min
Glucose, Bld: 115 mg/dL — ABNORMAL HIGH (ref 70–99)
Potassium: 3.4 meq/L — ABNORMAL LOW (ref 3.5–5.1)
Sodium: 141 meq/L (ref 135–145)
Total Bilirubin: 0.4 mg/dL (ref 0.3–1.2)
Total Protein: 7.7 g/dL (ref 6.0–8.3)

## 2013-01-15 LAB — LIPID PANEL: VLDL: 22 mg/dL (ref 0.0–40.0)

## 2013-01-15 LAB — HEPATITIS C ANTIBODY: HCV Ab: NEGATIVE

## 2013-01-15 NOTE — Assessment & Plan Note (Signed)
I will check her CBC and SPEP today to see if she has developed lymphoproliferative disease

## 2013-01-15 NOTE — Assessment & Plan Note (Signed)
GI referral

## 2013-01-15 NOTE — Assessment & Plan Note (Signed)
Will treat with amoxil 

## 2013-01-15 NOTE — Assessment & Plan Note (Signed)
FLP today 

## 2013-01-15 NOTE — Assessment & Plan Note (Signed)
Her BP is well controlled I will check her lytes and renal function 

## 2013-01-15 NOTE — Assessment & Plan Note (Signed)
I will check her A1C and will advise further

## 2013-01-15 NOTE — Assessment & Plan Note (Signed)
She agrees to lose weight with lifestyle modifications

## 2013-01-16 LAB — PROTEIN ELECTROPHORESIS, SERUM
Alpha-1-Globulin: 4.6 % (ref 2.9–4.9)
Alpha-2-Globulin: 10.1 % (ref 7.1–11.8)
Gamma Globulin: 21.1 % — ABNORMAL HIGH (ref 11.1–18.8)
Total Protein, Serum Electrophoresis: 7.7 g/dL (ref 6.0–8.3)

## 2013-01-20 ENCOUNTER — Encounter (HOSPITAL_COMMUNITY): Payer: Self-pay | Admitting: Emergency Medicine

## 2013-01-20 ENCOUNTER — Emergency Department (HOSPITAL_COMMUNITY): Payer: Medicare Other

## 2013-01-20 ENCOUNTER — Emergency Department (HOSPITAL_COMMUNITY)
Admission: EM | Admit: 2013-01-20 | Discharge: 2013-01-21 | Disposition: A | Discharge status: Home / Self Care | Payer: Medicare Other | Attending: Emergency Medicine | Admitting: Emergency Medicine

## 2013-01-20 DIAGNOSIS — A499 Bacterial infection, unspecified: Secondary | ICD-10-CM | POA: Insufficient documentation

## 2013-01-20 DIAGNOSIS — E876 Hypokalemia: Secondary | ICD-10-CM | POA: Insufficient documentation

## 2013-01-20 DIAGNOSIS — E119 Type 2 diabetes mellitus without complications: Secondary | ICD-10-CM | POA: Insufficient documentation

## 2013-01-20 DIAGNOSIS — F17200 Nicotine dependence, unspecified, uncomplicated: Secondary | ICD-10-CM | POA: Insufficient documentation

## 2013-01-20 DIAGNOSIS — J019 Acute sinusitis, unspecified: Secondary | ICD-10-CM | POA: Insufficient documentation

## 2013-01-20 DIAGNOSIS — I10 Essential (primary) hypertension: Secondary | ICD-10-CM | POA: Insufficient documentation

## 2013-01-20 DIAGNOSIS — E860 Dehydration: Secondary | ICD-10-CM

## 2013-01-20 DIAGNOSIS — B9689 Other specified bacterial agents as the cause of diseases classified elsewhere: Secondary | ICD-10-CM | POA: Insufficient documentation

## 2013-01-20 DIAGNOSIS — F3289 Other specified depressive episodes: Secondary | ICD-10-CM | POA: Insufficient documentation

## 2013-01-20 DIAGNOSIS — Z8719 Personal history of other diseases of the digestive system: Secondary | ICD-10-CM | POA: Insufficient documentation

## 2013-01-20 DIAGNOSIS — R1310 Dysphagia, unspecified: Secondary | ICD-10-CM | POA: Insufficient documentation

## 2013-01-20 DIAGNOSIS — Z79899 Other long term (current) drug therapy: Secondary | ICD-10-CM | POA: Insufficient documentation

## 2013-01-20 DIAGNOSIS — F329 Major depressive disorder, single episode, unspecified: Secondary | ICD-10-CM | POA: Insufficient documentation

## 2013-01-20 LAB — CBC WITH DIFFERENTIAL/PLATELET
Basophils Absolute: 0 10*3/uL (ref 0.0–0.1)
Basophils Relative: 0 % (ref 0–1)
Eosinophils Absolute: 0.2 10*3/uL (ref 0.0–0.7)
Hemoglobin: 14.3 g/dL (ref 12.0–15.0)
MCH: 30.2 pg (ref 26.0–34.0)
MCHC: 33.4 g/dL (ref 30.0–36.0)
Monocytes Relative: 10 % (ref 3–12)
Neutro Abs: 6.8 10*3/uL (ref 1.7–7.7)
Neutrophils Relative %: 64 % (ref 43–77)
RDW: 14.4 % (ref 11.5–15.5)

## 2013-01-20 LAB — BASIC METABOLIC PANEL
BUN: 19 mg/dL (ref 6–23)
BUN: 18 mg/dL (ref 6–23)
Calcium: 10.4 mg/dL (ref 8.4–10.5)
Calcium: 9.7 mg/dL (ref 8.4–10.5)
Creatinine, Ser: 1.16 mg/dL — ABNORMAL HIGH (ref 0.50–1.10)
GFR calc Af Amer: 56 mL/min — ABNORMAL LOW (ref >90)
GFR calc Af Amer: 59 mL/min — ABNORMAL LOW (ref >90)
GFR calc non Af Amer: 49 mL/min — ABNORMAL LOW (ref >90)
GFR calc non Af Amer: 51 mL/min — ABNORMAL LOW (ref >90)
Glucose, Bld: 96 mg/dL (ref 70–99)
Sodium: 140 mEq/L (ref 135–145)

## 2013-01-20 LAB — URINALYSIS, ROUTINE W REFLEX MICROSCOPIC
Hgb urine dipstick: NEGATIVE
Nitrite: NEGATIVE
Protein, ur: NEGATIVE mg/dL
Urobilinogen, UA: 1 mg/dL (ref 0.0–1.0)

## 2013-01-20 LAB — POCT I-STAT TROPONIN I: Troponin i, poc: 0.01 ng/mL (ref 0.00–0.08)

## 2013-01-20 MED ORDER — SODIUM CHLORIDE 0.9 % IV BOLUS (SEPSIS)
1000.0000 mL | Freq: Once | INTRAVENOUS | Status: AC
  Administered 2013-01-20: 1000 mL via INTRAVENOUS

## 2013-01-20 MED ORDER — HYDROCODONE-ACETAMINOPHEN 5-325 MG PO TABS
1.0000 | ORAL_TABLET | Freq: Once | ORAL | Status: DC
  Filled 2013-01-20: qty 1

## 2013-01-20 MED ORDER — POTASSIUM CHLORIDE CRYS ER 20 MEQ PO TBCR
40.0000 meq | EXTENDED_RELEASE_TABLET | Freq: Once | ORAL | Status: AC
  Administered 2013-01-20: 40 meq via ORAL
  Filled 2013-01-20: qty 2

## 2013-01-20 MED ORDER — MORPHINE SULFATE 4 MG/ML IJ SOLN
4.0000 mg | Freq: Once | INTRAMUSCULAR | Status: AC
  Administered 2013-01-20: 4 mg via INTRAVENOUS
  Filled 2013-01-20: qty 1

## 2013-01-20 MED ORDER — LIDOCAINE VISCOUS 2 % MT SOLN
20.0000 mL | Freq: Once | OROMUCOSAL | Status: AC
  Administered 2013-01-20: 20 mL via OROMUCOSAL
  Filled 2013-01-20: qty 20

## 2013-01-20 MED ORDER — FLUTICASONE PROPIONATE 50 MCG/ACT NA SUSP: 2.0000 | Freq: Every day | NASAL | Status: DC

## 2013-01-20 MED ORDER — SODIUM CHLORIDE 0.9 % IV BOLUS (SEPSIS)
500.0000 mL | Freq: Once | INTRAVENOUS | Status: AC
  Administered 2013-01-20: 500 mL via INTRAVENOUS

## 2013-01-20 MED ORDER — LIDOCAINE VISCOUS 2 % MT SOLN: 15.0000 mL | OROMUCOSAL | Status: DC | PRN

## 2013-01-20 NOTE — ED Notes (Signed)
Lt green and gold top in lab was drawn by previous shift.  Called Lab and they will use for BNP and TSH.

## 2013-01-20 NOTE — ED Provider Notes (Signed)
CSN: [id number]     Arrival date & time 01/20/13  1250 History   First MD Initiated Contact with Patient 01/20/13 1503     Chief Complaint  Patient presents with  . Fatigue   (Consider location/radiation/quality/duration/timing/severity/associated sxs/prior Treatment) HPI  April May is a 57 y.o. female with past medical history significant for Crohn's disease, hypertension and borderline diabetes coming in today complaining of increasing fatigue over the last week. Patient developed dysphagia, denies odynophagia, states that she feels that her swallowing is discoordinated associated with rhinorrhea, postnasal drip, dry cough, subjective fever and chills, hoarse voice, left neck and shoulder pain one week ago. She states the dysphagia is worse with water than solids. She was seen by her primary care physician and started on amoxicillin for sinusitis with postnasal drip, and she is on day 5 of her amoxicillin course. She was also seen by her GI Dr. hung who agreed that the dysphagia was likely secondary to irritation from postnasal drip. Patient is currently actively smoking one pack a day. She also states that this may be related to her CPAP which she has not been cleaning appropriately as directed. She denies chest pain, shortness of breath, abdominal pain, nausea vomiting, change in bowel or bladder habits.    Past Medical History  Diagnosis Date  . Allergy   . Depression   . Hypertension   . Crohn disease   . Diabetes mellitus type II    Past Surgical History  Procedure Laterality Date  . Appendectomy    . Abdominal hysterectomy    . Small intestine surgery    . Breast surgery     Family History  Problem Relation Age of Onset  . Hypertension Mother   . Arthritis Father   . Alcohol abuse Paternal Uncle   . Diabetes Paternal Grandmother   . Stroke Paternal Grandmother    History  Substance Use Topics  . Smoking status: Current Every Day Smoker -- 1.00 packs/day for 35  years    Types: Cigarettes  . Smokeless tobacco: Never Used  . Alcohol Use: No   OB History   Grav Para Term Preterm Abortions TAB SAB Ect Mult Living                 Review of Systems 10 systems reviewed and found to be negative, except as noted in the HPI  Allergies  Lisinopril; Crestor; Lipitor; and Azathioprine  Home Medications   Current Outpatient Rx  Name  Route  Sig  Dispense  Refill  . amoxicillin (AMOXIL) 875 MG tablet   Oral   Take 1 tablet (875 mg total) by mouth 2 (two) times daily.   20 tablet   0   . Azilsartan-Chlorthalidone (EDARBYCLOR) 40-25 MG TABS   Oral   Take 1 tablet by mouth daily.         Marland Kitchen buPROPion (WELLBUTRIN SR) 100 MG 12 hr tablet   Oral   Take 100 mg by mouth at bedtime.         . lamoTRIgine (LAMICTAL) 200 MG tablet   Oral   Take 200 mg by mouth at bedtime.          BP 108/58  Pulse 73  Temp(Src) 98.9 F (37.2 C) (Oral)  Resp 22  SpO2 95%  LMP 05/14/1988 Physical Exam  Nursing note and vitals reviewed. Constitutional: She is oriented to person, place, and time. She appears well-developed and well-nourished. No distress.  HENT:  Head: Normocephalic.  Mouth/Throat:  Oropharynx is clear and moist. No oropharyngeal exudate.  Moderately dry Mucous membranes, no tonsillar hypertrophy or exudate, posterior pharynx is moderately injected.  Eyes: Conjunctivae and EOM are normal.  Neck: Normal range of motion. Neck supple.  No midline tenderness to palpation or step-offs appreciated. Patient has full range of motion without pain.  Patient has tension and tenderness to palpation along the right trapezius muscle   Cardiovascular: Normal rate.   Pulmonary/Chest: Effort normal and breath sounds normal. No stridor. No respiratory distress. She has no wheezes. She has no rales. She exhibits no tenderness.  Abdominal: Soft. Bowel sounds are normal.  Musculoskeletal: Normal range of motion.  Neurological: She is alert and oriented to  person, place, and time.  Psychiatric: She has a normal mood and affect.    ED Course  Procedures (including critical care time) Labs Review Labs Reviewed  CBC WITH DIFFERENTIAL - Abnormal; Notable for the following:    WBC 10.7 (*)    Platelets 482 (*)    Monocytes Absolute 1.1 (*)    All other components within normal limits  URINALYSIS, ROUTINE W REFLEX MICROSCOPIC - Abnormal; Notable for the following:    Color, Urine AMBER (*)    APPearance CLOUDY (*)    Bilirubin Urine SMALL (*)    All other components within normal limits  BASIC METABOLIC PANEL - Abnormal; Notable for the following:    Potassium 3.3 (*)    CO2 33 (*)    Creatinine, Ser 1.21 (*)    GFR calc non Af Amer 49 (*)    GFR calc Af Amer 56 (*)    All other components within normal limits  BASIC METABOLIC PANEL - Abnormal; Notable for the following:    Potassium 3.2 (*)    Glucose, Bld 126 (*)    Creatinine, Ser 1.16 (*)    GFR calc non Af Amer 51 (*)    GFR calc Af Amer 59 (*)    All other components within normal limits  PRO B NATRIURETIC PEPTIDE  TSH  CG4 I-STAT (LACTIC ACID)  POCT I-STAT TROPONIN I   Imaging Review Dg Chest 2 View  01/20/2013   *RADIOLOGY REPORT*  Clinical Data: Shortness of breath  CHEST - 2 VIEW  Comparison: 02/13/2011  Findings: Lungs are clear. No pleural effusion or pneumothorax.  Cardiomediastinal silhouette is within normal limits.  Degenerative changes of the visualized thoracolumbar spine.  IMPRESSION: No evidence of acute cardiopulmonary disease.   Original Report Authenticated By: Charline Bills, M.D.    Date: 01/20/2013  Rate: 79  Rhythm: normal sinus rhythm  QRS Axis: normal  Intervals: normal  ST/T Wave abnormalities: normal  Conduction Disutrbances:none  Narrative Interpretation:   Old EKG Reviewed: unchanged   MDM   1. Dehydration   2. Tobacco use disorder   3. Acute bacterial sinusitis   4. Dysphagia, unspecified(787.20)   5. Hypokalemia      Filed  Vitals:   01/20/13 2200 01/20/13 2230 01/20/13 2232 01/20/13 2316  BP: 110/59 99/56 99/56  113/61  Pulse:   70 73  Temp:   98.9 F (37.2 C)   TempSrc:   Oral   Resp: 21 16 16 20   SpO2:   96% 98%     April May is a 57 y.o. female  reports fatigue, sensation of dysphagia with decreased by mouth intake over the course of the last week. Patient is being treated for sinusitis with amoxicillin. Patient is clinically dehydrated, her blood pressure is soft with  systolic in the low 90s. Patient will be fluid bolused. Bloodwork shows a mild hypokalemia at 3.3 and a creatinine of 1.21. EKG is nonischemic, troponin is negative, lactic acid is normal. TSH is pending, I have asked her to follow with her primary care Dr. to followup these results. After 3 L of fluid rehydration systolic is in the 110s. She has inability without difficulty.  Medications  HYDROcodone-acetaminophen (NORCO/VICODIN) 5-325 MG per tablet 1 tablet (1 tablet Oral Not Given 01/20/13 1646)  sodium chloride 0.9 % bolus 1,000 mL (not administered)  sodium chloride 0.9 % bolus 500 mL (500 mLs Intravenous New Bag/Given 01/20/13 1600)  sodium chloride 0.9 % bolus 1,000 mL (1,000 mLs Intravenous New Bag/Given 01/20/13 1700)    Pt is hemodynamically stable, appropriate for, and amenable to discharge at this time. Pt verbalized understanding and agrees with care plan. All questions answered. Outpatient follow-up and specific return precautions discussed.    New Prescriptions   FLUTICASONE (FLONASE) 50 MCG/ACT NASAL SPRAY    Place 2 sprays into the nose daily.   LIDOCAINE (XYLOCAINE) 2 % SOLUTION    Take 15 mLs by mouth every 3 (three) hours as needed for pain.    Note: Portions of this report may have been transcribed using voice recognition software. Every effort was made to ensure accuracy; however, inadvertent computerized transcription errors may be present      Wynetta Emery, PA-C 01/20/13 2330 Medical screening  examination/treatment/procedure(s) were conducted as a shared visit with non-physician practitioner(s) or resident and myself. I personally evaluated the patient during the encounter and agree with the findings and plan unless otherwise indicated.  Pt has felt fatigued gradually worsening for one week, recurrent congestion, post nasal drip and mild cough. No sick contacts. Sore throat, decreased po intake the past week. No fevers or neck stiffness. Smoking hx.  Exam well appearing, mild dry mm, pharynx no exudate/ swelling, neck supple mild anterior lymphadenopathy, abd soft/ nt, RRR. Clinically URI/ post nasal drip with dehydration. Fluid challenge, pain meds and close fup with pcp. Pt bp continued to be lower than her normal, one read 50s however changed arm and in 90s. Multiple fluid boluses, added cardiac eval. No acute findings except low K, pt very well appearing, only mild fatigue as sx.  Close fup outpt discussed.  Filed Vitals:    01/20/13 2200  01/20/13 2230  01/20/13 2232  01/20/13 2316   BP:  110/59  99/56  99/56  113/61   Pulse:    70  73   Temp:    98.9 F (37.2 C)    TempSrc:    Oral    Resp:  21  16  16  20    SpO2:    96%  98%    Fatigue, Sinusitis/URI, Hypokalemia   Enid Skeens, MD 01/21/13 0023

## 2013-01-20 NOTE — ED Notes (Signed)
I stat CG4 was given to Dr. Radford Pax.

## 2013-01-20 NOTE — ED Notes (Signed)
Not able to get lab.  Will call the lab.

## 2013-01-20 NOTE — ED Notes (Signed)
Pt here today because she feels fatigued.  States that she has been told she has an upper respiratory infection and has also been seeing her doctor for "trouble swallowing".  Has seen her PCP and has followed up with GI.  States she feels worse today because she hasn't been drinking much.

## 2013-01-20 NOTE — ED Notes (Signed)
Informed the PA that Phlebotomy  Tech from the main lab will draw for I-stat.

## 2013-01-22 ENCOUNTER — Encounter: Payer: Self-pay | Admitting: Internal Medicine

## 2013-01-22 ENCOUNTER — Ambulatory Visit (INDEPENDENT_AMBULATORY_CARE_PROVIDER_SITE_OTHER)
Admission: RE | Admit: 2013-01-22 | Discharge: 2013-01-22 | Disposition: A | Discharge status: Home / Self Care | Payer: Medicare Other | Source: Ambulatory Visit | Attending: Internal Medicine | Admitting: Internal Medicine

## 2013-01-22 ENCOUNTER — Other Ambulatory Visit (INDEPENDENT_AMBULATORY_CARE_PROVIDER_SITE_OTHER): Payer: Medicare Other

## 2013-01-22 ENCOUNTER — Ambulatory Visit (INDEPENDENT_AMBULATORY_CARE_PROVIDER_SITE_OTHER): Payer: Medicare Other | Admitting: Internal Medicine

## 2013-01-22 VITALS — BP 110/72 | HR 62 | Temp 98.2°F | Resp 16 | Wt 231.0 lb

## 2013-01-22 DIAGNOSIS — I10 Essential (primary) hypertension: Secondary | ICD-10-CM

## 2013-01-22 DIAGNOSIS — J370 Chronic laryngitis: Secondary | ICD-10-CM

## 2013-01-22 DIAGNOSIS — E876 Hypokalemia: Secondary | ICD-10-CM

## 2013-01-22 DIAGNOSIS — T502X5A Adverse effect of carbonic-anhydrase inhibitors, benzothiadiazides and other diuretics, initial encounter: Secondary | ICD-10-CM | POA: Insufficient documentation

## 2013-01-22 DIAGNOSIS — M542 Cervicalgia: Secondary | ICD-10-CM

## 2013-01-22 DIAGNOSIS — R1310 Dysphagia, unspecified: Secondary | ICD-10-CM

## 2013-01-22 LAB — BASIC METABOLIC PANEL
BUN: 9 mg/dL (ref 6–23)
Calcium: 9.4 mg/dL (ref 8.4–10.5)
GFR: 71.77 mL/min (ref >60.00)
Glucose, Bld: 100 mg/dL — ABNORMAL HIGH (ref 70–99)
Sodium: 140 mEq/L (ref 135–145)

## 2013-01-22 MED ORDER — POTASSIUM CHLORIDE CRYS ER 20 MEQ PO TBCR: 20.0000 meq | EXTENDED_RELEASE_TABLET | Freq: Three times a day (TID) | ORAL | Status: DC

## 2013-01-22 MED ORDER — HYDROCODONE-ACETAMINOPHEN 5-325 MG PO TABS: 1.0000 | ORAL_TABLET | Freq: Four times a day (QID) | ORAL | Status: DC | PRN

## 2013-01-22 NOTE — Patient Instructions (Signed)

## 2013-01-22 NOTE — Progress Notes (Signed)
Subjective:    Patient ID: April May, female    DOB: 08-Oct-1955, 57 y.o.   MRN: [id number]  Neck Pain  This is a recurrent problem. The current episode started 1 to 4 weeks ago. The problem occurs constantly. The problem has been unchanged. The pain is associated with nothing. The pain is present in the right side. The quality of the pain is described as shooting and aching. The pain is at a severity of 6/10. The pain is moderate. The symptoms are aggravated by twisting and position. The pain is worse during the day. Pertinent negatives include no chest pain, fever, headaches, leg pain, numbness, pain with swallowing, paresis, photophobia, syncope, tingling, trouble swallowing, visual change, weakness or weight loss. She has tried NSAIDs for the symptoms. The treatment provided mild relief.      Review of Systems  Constitutional: Negative.  Negative for fever, chills, weight loss, diaphoresis, activity change, appetite change, fatigue and unexpected weight change.  HENT: Positive for congestion, rhinorrhea, neck pain, voice change (persistent laryngitis) and postnasal drip. Negative for hearing loss, nosebleeds, sore throat, facial swelling, sneezing, drooling, mouth sores, trouble swallowing, neck stiffness, dental problem and sinus pressure.   Eyes: Negative.  Negative for photophobia.  Respiratory: Positive for apnea. Negative for cough, choking, chest tightness, shortness of breath, wheezing and stridor.   Cardiovascular: Negative.  Negative for chest pain, palpitations, leg swelling and syncope.  Gastrointestinal: Negative.  Negative for nausea, vomiting, abdominal pain, diarrhea, constipation and blood in stool.  Endocrine: Negative.   Genitourinary: Negative.   Musculoskeletal: Negative for myalgias, back pain, joint swelling, arthralgias and gait problem.  Skin: Negative.   Allergic/Immunologic: Negative.   Neurological: Negative.  Negative for dizziness, tingling, tremors,  seizures, syncope, weakness, light-headedness, numbness and headaches.  Hematological: Negative.  Negative for adenopathy. Does not bruise/bleed easily.  Psychiatric/Behavioral: Negative.  Negative for agitation.       Objective:   Physical Exam  Vitals reviewed. Constitutional: She is oriented to person, place, and time. She appears well-developed and well-nourished.  Non-toxic appearance. She does not have a sickly appearance. She does not appear ill. No distress.  Her voice is raspy but there is no resp distress  HENT:  Head: Normocephalic and atraumatic.  Mouth/Throat: Oropharynx is clear and moist. No oropharyngeal exudate.  Eyes: Conjunctivae are normal. Right eye exhibits no discharge. Left eye exhibits no discharge. No scleral icterus.  Neck: Normal range of motion. Neck supple. No JVD present. No tracheal deviation present. No thyromegaly present.  Cardiovascular: Normal rate, regular rhythm, normal heart sounds and intact distal pulses.  Exam reveals no gallop and no friction rub.   No murmur heard. Pulmonary/Chest: Effort normal and breath sounds normal. No accessory muscle usage or stridor. Not tachypneic. No respiratory distress. She has no decreased breath sounds. She has no wheezes. She has no rhonchi. She has no rales. She exhibits no tenderness.  Abdominal: Soft. Bowel sounds are normal. She exhibits no distension and no mass. There is no tenderness. There is no rebound and no guarding.  Musculoskeletal: Normal range of motion. She exhibits no edema and no tenderness.       Cervical back: Normal. She exhibits normal range of motion, no tenderness, no bony tenderness, no swelling, no edema, no deformity, no laceration, no pain, no spasm and normal pulse.  Lymphadenopathy:    She has no cervical adenopathy.  Neurological: She is alert and oriented to person, place, and time. She has normal reflexes. She displays normal  reflexes. No cranial nerve deficit. She exhibits normal  muscle tone. Coordination normal.  Skin: Skin is warm and dry. No rash noted. She is not diaphoretic. No erythema. No pallor.  Psychiatric: She has a normal mood and affect. Her behavior is normal. Judgment and thought content normal.     Lab Results  Component Value Date   WBC 10.7* 01/20/2013   HGB 14.3 01/20/2013   HCT 42.8 01/20/2013   PLT 482* 01/20/2013   GLUCOSE 126* 01/20/2013   CHOL 168 01/14/2013   TRIG 110.0 01/14/2013   HDL 34.70* 01/14/2013   LDLCALC 111* 01/14/2013   ALT 14 01/14/2013   AST 17 01/14/2013   NA 140 01/20/2013   K 3.2* 01/20/2013   CL 103 01/20/2013   CREATININE 1.16* 01/20/2013   BUN 18 01/20/2013   CO2 29 01/20/2013   TSH 0.547 01/20/2013   HGBA1C 6.5 01/14/2013   MICROALBUR 0.50 01/30/2010       Assessment & Plan:

## 2013-01-23 ENCOUNTER — Encounter: Payer: Self-pay | Admitting: Internal Medicine

## 2013-01-23 NOTE — Assessment & Plan Note (Signed)
Her BP is well controlled Today I will recheck her lytes and renal fucntion

## 2013-01-23 NOTE — Assessment & Plan Note (Signed)
She tells me that this has resolved She will see ENT and based on that evaluation will consider the need for a GI work up

## 2013-01-23 NOTE — Assessment & Plan Note (Signed)
Plain film and exam are normal She will try norco for the pain and I will consider the need for an MRI based on how well she responds to that

## 2013-01-23 NOTE — Assessment & Plan Note (Signed)
She is a smoker so I have asked her to see ENT for a laryngeal evaluation

## 2013-01-23 NOTE — Assessment & Plan Note (Signed)
She agrees to be more compliant with the K+ replacement therapy

## 2013-02-24 ENCOUNTER — Telehealth: Payer: Self-pay | Admitting: *Deleted

## 2013-02-24 NOTE — Telephone Encounter (Signed)
Pt called states while on inpatient her BP medication was d/c'd.  States her BP is going up, 147/80 today, requests whether she is to resume medications.

## 2013-02-24 NOTE — Telephone Encounter (Signed)
Yes, restart then

## 2013-02-25 NOTE — Telephone Encounter (Signed)
Spoke with pt, advised of MDs message 

## 2013-03-04 ENCOUNTER — Other Ambulatory Visit (INDEPENDENT_AMBULATORY_CARE_PROVIDER_SITE_OTHER): Payer: Medicare Other

## 2013-03-04 ENCOUNTER — Telehealth: Payer: Self-pay | Admitting: Internal Medicine

## 2013-03-04 ENCOUNTER — Ambulatory Visit (INDEPENDENT_AMBULATORY_CARE_PROVIDER_SITE_OTHER): Payer: Medicare Other | Admitting: Internal Medicine

## 2013-03-04 ENCOUNTER — Encounter: Payer: Self-pay | Admitting: Internal Medicine

## 2013-03-04 VITALS — BP 122/66 | HR 64 | Temp 98.3°F | Resp 16 | Ht 62.0 in | Wt 215.0 lb

## 2013-03-04 DIAGNOSIS — E876 Hypokalemia: Secondary | ICD-10-CM

## 2013-03-04 DIAGNOSIS — R1310 Dysphagia, unspecified: Secondary | ICD-10-CM

## 2013-03-04 DIAGNOSIS — K5090 Crohn's disease, unspecified, without complications: Secondary | ICD-10-CM

## 2013-03-04 DIAGNOSIS — D72829 Elevated white blood cell count, unspecified: Secondary | ICD-10-CM

## 2013-03-04 DIAGNOSIS — R634 Abnormal weight loss: Secondary | ICD-10-CM | POA: Insufficient documentation

## 2013-03-04 LAB — COMPREHENSIVE METABOLIC PANEL
ALT: 12 U/L (ref 0–35)
AST: 16 U/L (ref 0–37)
Albumin: 4 g/dL (ref 3.5–5.2)
Calcium: 9.8 mg/dL (ref 8.4–10.5)
Chloride: 96 mEq/L (ref 96–112)
Potassium: 3.1 mEq/L — ABNORMAL LOW (ref 3.5–5.1)

## 2013-03-04 LAB — URINALYSIS, ROUTINE W REFLEX MICROSCOPIC
Ketones, ur: NEGATIVE
Leukocytes, UA: NEGATIVE
Specific Gravity, Urine: 1.025 (ref 1.000–1.030)
Urine Glucose: NEGATIVE
pH: 6 (ref 5.0–8.0)

## 2013-03-04 LAB — CBC WITH DIFFERENTIAL/PLATELET
Basophils Absolute: 0.1 10*3/uL (ref 0.0–0.1)
Eosinophils Absolute: 0.2 10*3/uL (ref 0.0–0.7)
Lymphocytes Relative: 20.3 % (ref 12.0–46.0)
MCHC: 33.3 g/dL (ref 30.0–36.0)
Neutrophils Relative %: 66.7 % (ref 43.0–77.0)
RDW: 14.4 % (ref 11.5–14.6)

## 2013-03-04 LAB — SEDIMENTATION RATE: Sed Rate: 29 mm/hr — ABNORMAL HIGH (ref 0–22)

## 2013-03-04 LAB — LIPASE: Lipase: 33 U/L (ref 11.0–59.0)

## 2013-03-04 NOTE — Progress Notes (Signed)
Subjective:    Patient ID: April May, female    DOB: [birth year], 57 y.o.   MRN: [id number]  Cough This is a recurrent problem. The current episode started 1 to 4 weeks ago. The problem has been gradually improving. The problem occurs every few hours. The cough is non-productive. Associated symptoms include nasal congestion. Pertinent negatives include no chest pain, chills, ear congestion, ear pain, fever, headaches, heartburn, hemoptysis, myalgias, postnasal drip, rash, rhinorrhea, sore throat, shortness of breath, sweats, weight loss or wheezing. Nothing aggravates the symptoms. Risk factors for lung disease include smoking/tobacco exposure. She has tried OTC cough suppressant for the symptoms. The treatment provided moderate relief.      Review of Systems  Constitutional: Positive for appetite change (loss of appetite) and unexpected weight change (weight loss). Negative for fever, chills, weight loss, diaphoresis, activity change and fatigue.  HENT: Positive for congestion. Negative for ear pain, nosebleeds, postnasal drip, rhinorrhea, sinus pressure, sneezing, sore throat, trouble swallowing and voice change.   Eyes: Negative.   Respiratory: Positive for cough. Negative for apnea, hemoptysis, choking, chest tightness, shortness of breath, wheezing and stridor.   Cardiovascular: Negative.  Negative for chest pain, palpitations and leg swelling.  Gastrointestinal: Positive for constipation. Negative for heartburn, nausea, vomiting, abdominal pain, diarrhea, blood in stool, abdominal distention, anal bleeding and rectal pain.  Endocrine: Negative.   Genitourinary: Negative.   Musculoskeletal: Negative.  Negative for myalgias.  Skin: Negative.  Negative for rash.  Allergic/Immunologic: Negative.   Neurological: Negative.  Negative for dizziness, tremors, syncope, weakness, light-headedness and headaches.  Hematological: Negative.  Negative for adenopathy. Does not bruise/bleed  easily.  Psychiatric/Behavioral: Negative.        Objective:   Physical Exam  Vitals reviewed. Constitutional: She is oriented to person, place, and time. She appears well-developed and well-nourished.  Non-toxic appearance. She does not have a sickly appearance. She does not appear ill. No distress.  HENT:  Head: Normocephalic and atraumatic.  Mouth/Throat: Oropharynx is clear and moist. No oropharyngeal exudate.  Eyes: Conjunctivae are normal. Right eye exhibits no discharge. Left eye exhibits no discharge. No scleral icterus.  Neck: Normal range of motion. Neck supple. No JVD present. No tracheal deviation present. No thyromegaly present.  Cardiovascular: Normal rate, regular rhythm, normal heart sounds and intact distal pulses.  Exam reveals no gallop and no friction rub.   No murmur heard. Pulmonary/Chest: Effort normal and breath sounds normal. No stridor. No respiratory distress. She has no wheezes. She has no rales. She exhibits no tenderness.  Abdominal: Soft. Bowel sounds are normal. She exhibits no distension and no mass. There is no tenderness. There is no rebound and no guarding.  Musculoskeletal: Normal range of motion. She exhibits no edema and no tenderness.  Lymphadenopathy:    She has no cervical adenopathy.  Neurological: She is oriented to person, place, and time.  Skin: Skin is warm and dry. No rash noted. She is not diaphoretic. No erythema. No pallor.  Psychiatric: She has a normal mood and affect. Her behavior is normal. Judgment and thought content normal.     Lab Results  Component Value Date   WBC 10.7* 01/20/2013   HGB 14.3 01/20/2013   HCT 42.8 01/20/2013   PLT 482* 01/20/2013   GLUCOSE 100* 01/22/2013   CHOL 168 01/14/2013   TRIG 110.0 01/14/2013   HDL 34.70* 01/14/2013   LDLCALC 111* 01/14/2013   ALT 14 01/14/2013   AST 17 01/14/2013   NA 140 01/22/2013  K 3.3* 01/22/2013   CL 105 01/22/2013   CREATININE 1.0 01/22/2013   BUN 9 01/22/2013   CO2 30 01/22/2013   TSH  0.547 01/20/2013   HGBA1C 6.5 01/14/2013   MICROALBUR 0.50 01/30/2010       Assessment & Plan:

## 2013-03-04 NOTE — Telephone Encounter (Signed)
Pt request phone call from Dr. Ronnald Ramp assistant due to the reason of the referral to GI. Pt does not think she need to see GI, please call pt ASAP. Pt had an appt this morning 03/04/13.

## 2013-03-04 NOTE — Telephone Encounter (Signed)
Pt informed

## 2013-03-04 NOTE — Telephone Encounter (Signed)
Trouble swallowing and weight loss

## 2013-03-04 NOTE — Telephone Encounter (Signed)
yes

## 2013-03-04 NOTE — Assessment & Plan Note (Signed)
I will check her cortisol level to see if she has adrenal insufficiency Will also recheck her K+ and Mg++ levels She is not bale to tolerate the oral K+ replacement due to stomach upset

## 2013-03-04 NOTE — Telephone Encounter (Signed)
Pt was just seen.  Should she just take theraflu? She has congestion and a cough.

## 2013-03-04 NOTE — Assessment & Plan Note (Signed)
I will recheck her CBC and will check and ESR and SPEP to see if there is concern for lymphoproliferative disease

## 2013-03-04 NOTE — Patient Instructions (Signed)

## 2013-03-04 NOTE — Assessment & Plan Note (Signed)
I will check her ESR to see if there is any concern for disease flare

## 2013-03-04 NOTE — Assessment & Plan Note (Signed)
I do not see anything focal on her today to explain weight loss I will check her labs to look for liver, pancreas, thyroid, cancer, etc.

## 2013-03-04 NOTE — Assessment & Plan Note (Signed)
Gi referral 

## 2013-03-04 NOTE — Telephone Encounter (Signed)
Pt called requesting why she is being referred to GI. Please advise

## 2013-03-05 NOTE — Telephone Encounter (Signed)
Spoke with pt advised of MDs message 

## 2013-03-06 ENCOUNTER — Encounter: Payer: Self-pay | Admitting: Internal Medicine

## 2013-03-06 LAB — PROTEIN ELECTROPHORESIS, SERUM
Albumin ELP: 49.5 % — ABNORMAL LOW (ref 55.8–66.1)
Beta 2: 5.2 % (ref 3.2–6.5)
Gamma Globulin: 18.5 % (ref 11.1–18.8)

## 2013-03-19 ENCOUNTER — Other Ambulatory Visit: Payer: Self-pay

## 2013-03-28 ENCOUNTER — Emergency Department (HOSPITAL_COMMUNITY): Payer: Medicare Other

## 2013-03-28 ENCOUNTER — Encounter (HOSPITAL_COMMUNITY): Payer: Self-pay | Admitting: Emergency Medicine

## 2013-03-28 ENCOUNTER — Emergency Department (HOSPITAL_COMMUNITY)
Admission: EM | Admit: 2013-03-28 | Discharge: 2013-03-28 | Disposition: A | Discharge status: Home / Self Care | Payer: Medicare Other | Attending: Emergency Medicine | Admitting: Emergency Medicine

## 2013-03-28 DIAGNOSIS — I10 Essential (primary) hypertension: Secondary | ICD-10-CM | POA: Insufficient documentation

## 2013-03-28 DIAGNOSIS — Z79899 Other long term (current) drug therapy: Secondary | ICD-10-CM | POA: Insufficient documentation

## 2013-03-28 DIAGNOSIS — IMO0002 Reserved for concepts with insufficient information to code with codable children: Secondary | ICD-10-CM | POA: Insufficient documentation

## 2013-03-28 DIAGNOSIS — F329 Major depressive disorder, single episode, unspecified: Secondary | ICD-10-CM | POA: Insufficient documentation

## 2013-03-28 DIAGNOSIS — E119 Type 2 diabetes mellitus without complications: Secondary | ICD-10-CM | POA: Insufficient documentation

## 2013-03-28 DIAGNOSIS — E876 Hypokalemia: Secondary | ICD-10-CM

## 2013-03-28 DIAGNOSIS — F3289 Other specified depressive episodes: Secondary | ICD-10-CM | POA: Insufficient documentation

## 2013-03-28 DIAGNOSIS — F17200 Nicotine dependence, unspecified, uncomplicated: Secondary | ICD-10-CM | POA: Insufficient documentation

## 2013-03-28 DIAGNOSIS — R05 Cough: Secondary | ICD-10-CM | POA: Insufficient documentation

## 2013-03-28 DIAGNOSIS — R059 Cough, unspecified: Secondary | ICD-10-CM | POA: Insufficient documentation

## 2013-03-28 DIAGNOSIS — Z8719 Personal history of other diseases of the digestive system: Secondary | ICD-10-CM | POA: Insufficient documentation

## 2013-03-28 LAB — COMPREHENSIVE METABOLIC PANEL
ALT: 9 U/L (ref 0–35)
AST: 14 U/L (ref 0–37)
BUN: 10 mg/dL (ref 6–23)
CO2: 28 mEq/L (ref 19–32)
Calcium: 9.8 mg/dL (ref 8.4–10.5)
Creatinine, Ser: 0.79 mg/dL (ref 0.50–1.10)
GFR calc Af Amer: >90 mL/min (ref >90)
GFR calc non Af Amer: >90 mL/min (ref >90)
Potassium: 3 mEq/L — ABNORMAL LOW (ref 3.5–5.1)
Sodium: 137 mEq/L (ref 135–145)
Total Bilirubin: 0.3 mg/dL (ref 0.3–1.2)
Total Protein: 7.8 g/dL (ref 6.0–8.3)

## 2013-03-28 LAB — CBC WITH DIFFERENTIAL/PLATELET
Basophils Absolute: 0 10*3/uL (ref 0.0–0.1)
Eosinophils Absolute: 0.1 10*3/uL (ref 0.0–0.7)
Eosinophils Relative: 1 % (ref 0–5)
HCT: 43.7 % (ref 36.0–46.0)
Lymphocytes Relative: 15 % (ref 12–46)
MCH: 30.8 pg (ref 26.0–34.0)
MCHC: 34.1 g/dL (ref 30.0–36.0)
MCV: 90.3 fL (ref 78.0–100.0)
Monocytes Absolute: 1.1 10*3/uL — ABNORMAL HIGH (ref 0.1–1.0)
RBC: 4.84 MIL/uL (ref 3.87–5.11)
RDW: 13.4 % (ref 11.5–15.5)
WBC: 12 10*3/uL — ABNORMAL HIGH (ref 4.0–10.5)

## 2013-03-28 LAB — URINALYSIS, ROUTINE W REFLEX MICROSCOPIC
Hgb urine dipstick: NEGATIVE
Leukocytes, UA: NEGATIVE
Nitrite: NEGATIVE
Protein, ur: NEGATIVE mg/dL
Specific Gravity, Urine: 1.018 (ref 1.005–1.030)
Urobilinogen, UA: 0.2 mg/dL (ref 0.0–1.0)
pH: 6 (ref 5.0–8.0)

## 2013-03-28 MED ORDER — SODIUM CHLORIDE 0.9 % IV BOLUS (SEPSIS)
1000.0000 mL | Freq: Once | INTRAVENOUS | Status: AC
  Administered 2013-03-28: 1000 mL via INTRAVENOUS

## 2013-03-28 MED ORDER — POTASSIUM CHLORIDE CRYS ER 20 MEQ PO TBCR
40.0000 meq | EXTENDED_RELEASE_TABLET | Freq: Once | ORAL | Status: AC
  Administered 2013-03-28: 40 meq via ORAL
  Filled 2013-03-28: qty 2

## 2013-03-28 NOTE — ED Notes (Signed)
Pt c/o fatigue, body aches, lack of appetite since she was treated for an upper respiratory infection last month. States she took full course of abx for URI. A&Ox4, resp e/u

## 2013-03-28 NOTE — ED Provider Notes (Signed)
CSN: [id number]     Arrival date & time 03/28/13  1548 History   First MD Initiated Contact with Patient 03/28/13 1557     Chief Complaint  Patient presents with  . Fatigue   (Consider location/radiation/quality/duration/timing/severity/associated sxs/prior Treatment) Patient is a 57 y.o. female presenting with weakness.  Weakness This is a chronic problem. Episode onset: 1.5 months. The problem occurs constantly. The problem has not changed since onset.Pertinent negatives include no chest pain, no abdominal pain and no shortness of breath. Associated symptoms comments: Malaise, intermittent muscle aches, cough productive of dark sputum, decreased appetite.. Nothing aggravates the symptoms. Nothing relieves the symptoms. Treatments tried: placed on amoxicillin two months ago for sinusitis nd has been feeling poorly since.     Past Medical History  Diagnosis Date  . Allergy   . Depression   . Hypertension   . Crohn disease   . Diabetes mellitus type II    Past Surgical History  Procedure Laterality Date  . Appendectomy    . Abdominal hysterectomy    . Small intestine surgery    . Breast surgery     Family History  Problem Relation Age of Onset  . Hypertension Mother   . Arthritis Father   . Alcohol abuse Paternal Uncle   . Diabetes Paternal Grandmother   . Stroke Paternal Grandmother    History  Substance Use Topics  . Smoking status: Current Every Day Smoker -- 1.00 packs/day for 35 years    Types: Cigarettes  . Smokeless tobacco: Never Used  . Alcohol Use: No   OB History   Grav Para Term Preterm Abortions TAB SAB Ect Mult Living                 Review of Systems  Constitutional: Negative for fever.  HENT: Negative for congestion.   Respiratory: Positive for cough. Negative for shortness of breath.   Cardiovascular: Negative for chest pain.  Gastrointestinal: Negative for nausea, vomiting, abdominal pain and diarrhea.  Neurological: Positive for weakness.  All  other systems reviewed and are negative.    Allergies  Lisinopril; Crestor; Lipitor; and Azathioprine  Home Medications   Current Outpatient Rx  Name  Route  Sig  Dispense  Refill  . buPROPion (WELLBUTRIN SR) 100 MG 12 hr tablet   Oral   Take 100 mg by mouth at bedtime.         . fluticasone (FLONASE) 50 MCG/ACT nasal spray   Nasal   Place 2 sprays into the nose daily.   16 g   0   . HYDROcodone-acetaminophen (NORCO/VICODIN) 5-325 MG per tablet   Oral   Take 1 tablet by mouth every 6 (six) hours as needed for pain.   65 tablet   1   . lamoTRIgine (LAMICTAL) 200 MG tablet   Oral   Take 200 mg by mouth at bedtime.         . lidocaine (XYLOCAINE) 2 % solution   Oral   Take 15 mLs by mouth every 3 (three) hours as needed for pain.   100 mL   0   . potassium chloride SA (K-DUR,KLOR-CON) 20 MEQ tablet   Oral   Take 1 tablet (20 mEq total) by mouth 3 (three) times daily.   90 tablet   3    BP 106/72  Pulse 96  Temp(Src) 98.6 F (37 C) (Oral)  Resp 20  Wt 216 lb 3 oz (98.062 kg)  SpO2 99%  LMP 05/14/1988 Physical Exam  Nursing note and vitals reviewed. Constitutional: She is oriented to person, place, and time. She appears well-developed and well-nourished. No distress.  HENT:  Head: Normocephalic and atraumatic.  Mouth/Throat: Oropharynx is clear and moist.  Eyes: Conjunctivae are normal. Pupils are equal, round, and reactive to light. No scleral icterus.  Neck: Neck supple.  Cardiovascular: Normal rate, regular rhythm, normal heart sounds and intact distal pulses.   No murmur heard. Pulmonary/Chest: Effort normal and breath sounds normal. No stridor. No respiratory distress. She has no rales.  Abdominal: Soft. Bowel sounds are normal. She exhibits no distension. There is no tenderness.  Musculoskeletal: Normal range of motion.  Neurological: She is alert and oriented to person, place, and time.  Skin: Skin is warm and dry. No rash noted.  Psychiatric:  She has a normal mood and affect. Her behavior is normal.    ED Course  Procedures (including critical care time) Labs Review Labs Reviewed  CBC WITH DIFFERENTIAL - Abnormal; Notable for the following:    WBC 12.0 (*)    Neutro Abs 9.0 (*)    Monocytes Absolute 1.1 (*)    All other components within normal limits  COMPREHENSIVE METABOLIC PANEL - Abnormal; Notable for the following:    Potassium 3.0 (*)    Glucose, Bld 102 (*)    All other components within normal limits  URINALYSIS, ROUTINE W REFLEX MICROSCOPIC   Imaging Review Dg Chest 2 View  03/28/2013   CLINICAL DATA:  Weakness, fatigue, upper chest pain for 1 month.  EXAM: CHEST  2 VIEW  COMPARISON:  01/20/2013  FINDINGS: The heart size and mediastinal contours are within normal limits. Both lungs are clear. The visualized skeletal structures are unremarkable.  IMPRESSION: No active cardiopulmonary disease.   Electronically Signed   By: Elige Ko   On: 03/28/2013 17:28  All radiology studies independently viewed by me.     EKG Interpretation     Ventricular Rate:  89 PR Interval:  185 QRS Duration: 87 QT Interval:  336 QTC Calculation: 409 R Axis:   -2 Text Interpretation:  Sinus rhythm Low voltage, precordial leads No significant change was found            MDM   1. Hypokalemia    Generalized malaise and fatigue for past month and a half.  Per chart review, she has been hypokalemic and reports incomplete compliance.  She is nontoxic, in NAD, with a nonfocal exam.    Labs show worsening hypokalemia, possible cause of her symptoms.  She tolerated oral K.  Emphasized need for compliance with her outpt k replacement.  She will follow up with her PCP.      Candyce Churn, MD 03/29/13 1059

## 2013-03-28 NOTE — ED Notes (Signed)
MD at bedside for assessment

## 2013-06-18 ENCOUNTER — Ambulatory Visit: Payer: Medicare Other | Admitting: Internal Medicine

## 2013-07-01 ENCOUNTER — Other Ambulatory Visit: Payer: Self-pay | Admitting: Internal Medicine

## 2013-09-14 ENCOUNTER — Ambulatory Visit (INDEPENDENT_AMBULATORY_CARE_PROVIDER_SITE_OTHER): Payer: Medicare Other | Admitting: Internal Medicine

## 2013-09-14 ENCOUNTER — Encounter: Payer: Self-pay | Admitting: Internal Medicine

## 2013-09-14 ENCOUNTER — Other Ambulatory Visit (INDEPENDENT_AMBULATORY_CARE_PROVIDER_SITE_OTHER): Payer: Medicare Other

## 2013-09-14 VITALS — BP 134/88 | HR 81 | Temp 97.7°F | Resp 16 | Wt 220.0 lb

## 2013-09-14 DIAGNOSIS — E1165 Type 2 diabetes mellitus with hyperglycemia: Principal | ICD-10-CM

## 2013-09-14 DIAGNOSIS — IMO0001 Reserved for inherently not codable concepts without codable children: Secondary | ICD-10-CM

## 2013-09-14 DIAGNOSIS — D72829 Elevated white blood cell count, unspecified: Secondary | ICD-10-CM

## 2013-09-14 DIAGNOSIS — E876 Hypokalemia: Secondary | ICD-10-CM

## 2013-09-14 DIAGNOSIS — I10 Essential (primary) hypertension: Secondary | ICD-10-CM

## 2013-09-14 DIAGNOSIS — J309 Allergic rhinitis, unspecified: Secondary | ICD-10-CM

## 2013-09-14 LAB — CBC WITH DIFFERENTIAL/PLATELET
BASOS ABS: 0 10*3/uL (ref 0.0–0.1)
Basophils Relative: 0.3 % (ref 0.0–3.0)
Eosinophils Absolute: 0.2 10*3/uL (ref 0.0–0.7)
Eosinophils Relative: 1.8 % (ref 0.0–5.0)
HEMATOCRIT: 42.2 % (ref 36.0–46.0)
HEMOGLOBIN: 14.1 g/dL (ref 12.0–15.0)
LYMPHS ABS: 3 10*3/uL (ref 0.7–4.0)
Lymphocytes Relative: 24.8 % (ref 12.0–46.0)
MCHC: 33.4 g/dL (ref 30.0–36.0)
MCV: 90.4 fl (ref 78.0–100.0)
MONO ABS: 1 10*3/uL (ref 0.1–1.0)
Monocytes Relative: 8.1 % (ref 3.0–12.0)
NEUTROS ABS: 8 10*3/uL — AB (ref 1.4–7.7)
Neutrophils Relative %: 65 % (ref 43.0–77.0)
PLATELETS: 356 10*3/uL (ref 150.0–400.0)
RBC: 4.66 Mil/uL (ref 3.87–5.11)
RDW: 13.8 % (ref 11.5–14.6)
WBC: 12.3 10*3/uL — ABNORMAL HIGH (ref 4.5–10.5)

## 2013-09-14 LAB — BASIC METABOLIC PANEL
BUN: 10 mg/dL (ref 6–23)
CALCIUM: 9.4 mg/dL (ref 8.4–10.5)
CHLORIDE: 101 meq/L (ref 96–112)
CO2: 30 meq/L (ref 19–32)
CREATININE: 0.9 mg/dL (ref 0.4–1.2)
GFR: 84.91 mL/min (ref >60.00)
GLUCOSE: 108 mg/dL — AB (ref 70–99)
Potassium: 3.6 mEq/L (ref 3.5–5.1)
Sodium: 139 mEq/L (ref 135–145)

## 2013-09-14 LAB — HEMOGLOBIN A1C: Hgb A1c MFr Bld: 6.3 % (ref 4.6–6.5)

## 2013-09-14 LAB — MAGNESIUM: Magnesium: 1.9 mg/dL (ref 1.5–2.5)

## 2013-09-14 MED ORDER — FLUTICASONE PROPIONATE 50 MCG/ACT NA SUSP: 2.0000 | Freq: Every day | NASAL | Status: DC | PRN

## 2013-09-14 MED ORDER — CETIRIZINE HCL 10 MG PO TABS: 10.0000 mg | ORAL_TABLET | Freq: Every day | ORAL | Status: DC

## 2013-09-14 NOTE — Assessment & Plan Note (Signed)
I will recheck her K+ and Mg++ levels

## 2013-09-14 NOTE — Assessment & Plan Note (Signed)
I have asked her to start flonase and zyrtec to treat this

## 2013-09-14 NOTE — Progress Notes (Signed)
Pre-visit discussion using our clinic review tool, as applicable. No additional management support is needed unless otherwise documented below in the visit note.  

## 2013-09-14 NOTE — Assessment & Plan Note (Signed)
Her blood sugars are well controlled 

## 2013-09-14 NOTE — Progress Notes (Signed)
Subjective:    Patient ID: April May, female    DOB: [birth year], 58 y.o.   MRN: [id number]  Sinusitis This is a new problem. The current episode started in the past 7 days. The problem is unchanged. There has been no fever. The fever has been present for less than 1 day. Her pain is at a severity of 0/10. She is experiencing no pain. Associated symptoms include congestion and sneezing. Pertinent negatives include no chills, coughing, diaphoresis, ear pain, headaches, hoarse voice, neck pain, shortness of breath, sinus pressure, sore throat or swollen glands. Past treatments include nothing. The treatment provided no relief.      Review of Systems  Constitutional: Negative.  Negative for fever, chills, diaphoresis, activity change, appetite change and fatigue.  HENT: Positive for congestion, postnasal drip, rhinorrhea and sneezing. Negative for ear pain, hoarse voice, nosebleeds, sinus pressure, sore throat, tinnitus, trouble swallowing and voice change.   Eyes: Negative.   Respiratory: Negative.  Negative for cough, choking, chest tightness, shortness of breath and stridor.   Cardiovascular: Negative.  Negative for chest pain, palpitations and leg swelling.  Gastrointestinal: Negative.  Negative for nausea, vomiting, abdominal pain, diarrhea, constipation and blood in stool.  Endocrine: Negative.   Genitourinary: Negative.   Musculoskeletal: Negative.  Negative for neck pain.  Skin: Negative.   Allergic/Immunologic: Negative.   Neurological: Negative.  Negative for dizziness, tremors, weakness, light-headedness and headaches.  Hematological: Negative.  Negative for adenopathy. Does not bruise/bleed easily.  Psychiatric/Behavioral: Negative.        Objective:   Physical Exam  Vitals reviewed. Constitutional: She is oriented to person, place, and time. She appears well-developed and well-nourished. No distress.  HENT:  Head: Normocephalic and atraumatic.  Mouth/Throat:  Oropharynx is clear and moist. No oropharyngeal exudate.  Eyes: Conjunctivae are normal. Right eye exhibits no discharge. Left eye exhibits no discharge. No scleral icterus.  Neck: Normal range of motion. Neck supple. No JVD present. No tracheal deviation present. No thyromegaly present.  Cardiovascular: Normal rate, regular rhythm, normal heart sounds and intact distal pulses.  Exam reveals no gallop and no friction rub.   No murmur heard. Pulmonary/Chest: Effort normal and breath sounds normal. No stridor. No respiratory distress. She has no wheezes. She has no rales. She exhibits no tenderness.  Abdominal: Soft. Bowel sounds are normal. She exhibits no distension and no mass. There is no tenderness. There is no rebound and no guarding.  Musculoskeletal: Normal range of motion. She exhibits no edema and no tenderness.  Lymphadenopathy:    She has no cervical adenopathy.  Neurological: She is oriented to person, place, and time.  Skin: Skin is warm and dry. No rash noted. She is not diaphoretic. No erythema. No pallor.  Psychiatric: She has a normal mood and affect. Her behavior is normal. Judgment and thought content normal.     Lab Results  Component Value Date   WBC 12.0* 03/28/2013   HGB 14.9 03/28/2013   HCT 43.7 03/28/2013   PLT 348 03/28/2013   GLUCOSE 102* 03/28/2013   CHOL 168 01/14/2013   TRIG 110.0 01/14/2013   HDL 34.70* 01/14/2013   LDLCALC 111* 01/14/2013   ALT 9 03/28/2013   AST 14 03/28/2013   NA 137 03/28/2013   K 3.0* 03/28/2013   CL 98 03/28/2013   CREATININE 0.79 03/28/2013   BUN 10 03/28/2013   CO2 28 03/28/2013   TSH 0.57 03/04/2013   HGBA1C 6.5 01/14/2013   MICROALBUR 0.50 01/30/2010  Assessment & Plan:

## 2013-09-14 NOTE — Assessment & Plan Note (Signed)
Her BP is well controlled Lytes and renal function are stable 

## 2013-09-14 NOTE — Assessment & Plan Note (Signed)
This is stable 

## 2013-09-14 NOTE — Patient Instructions (Signed)
Type 2 Diabetes Mellitus, Adult Type 2 diabetes mellitus, often simply referred to as type 2 diabetes, is a long-lasting (chronic) disease. In type 2 diabetes, the pancreas does not make enough insulin (a hormone), the cells are less responsive to the insulin that is made (insulin resistance), or both. Normally, insulin moves sugars from food into the tissue cells. The tissue cells use the sugars for energy. The lack of insulin or the lack of normal response to insulin causes excess sugars to build up in the blood instead of going into the tissue cells. As a result, high blood sugar (hyperglycemia) develops. The effect of high sugar (glucose) levels can cause many complications. Type 2 diabetes was also previously called adult-onset diabetes but it can occur at any age.  RISK FACTORS  A person is predisposed to developing type 2 diabetes if someone in the family has the disease and also has one or more of the following primary risk factors:  Overweight.  An inactive lifestyle.  A history of consistently eating high-calorie foods. Maintaining a normal weight and regular physical activity can reduce the chance of developing type 2 diabetes. SYMPTOMS  A person with type 2 diabetes may not show symptoms initially. The symptoms of type 2 diabetes appear slowly. The symptoms include:  Increased thirst (polydipsia).  Increased urination (polyuria).  Increased urination during the night (nocturia).  Weight loss. This weight loss may be rapid.  Frequent, recurring infections.  Tiredness (fatigue).  Weakness.  Vision changes, such as blurred vision.  Fruity smell to your breath.  Abdominal pain.  Nausea or vomiting.  Cuts or bruises which are slow to heal.  Tingling or numbness in the hands or feet. DIAGNOSIS Type 2 diabetes is frequently not diagnosed until complications of diabetes are present. Type 2 diabetes is diagnosed when symptoms or complications are present and when blood  glucose levels are increased. Your blood glucose level may be checked by one or more of the following blood tests:  A fasting blood glucose test. You will not be allowed to eat for at least 8 hours before a blood sample is taken.  A random blood glucose test. Your blood glucose is checked at any time of the day regardless of when you ate.  A hemoglobin A1c blood glucose test. A hemoglobin A1c test provides information about blood glucose control over the previous 3 months.  An oral glucose tolerance test (OGTT). Your blood glucose is measured after you have not eaten (fasted) for 2 hours and then after you drink a glucose-containing beverage. TREATMENT   You may need to take insulin or diabetes medicine daily to keep blood glucose levels in the desired range.  You will need to match insulin dosing with exercise and healthy food choices. The treatment goal is to maintain the before meal blood sugar (preprandial glucose) level at 70 130 mg/dL. HOME CARE INSTRUCTIONS   Have your hemoglobin A1c level checked twice a year.  Perform daily blood glucose monitoring as directed by your caregiver.  Monitor urine ketones when you are ill and as directed by your caregiver.  Take your diabetes medicine or insulin as directed by your caregiver to maintain your blood glucose levels in the desired range.  Never run out of diabetes medicine or insulin. It is needed every day.  Adjust insulin based on your intake of carbohydrates. Carbohydrates can raise blood glucose levels but need to be included in your diet. Carbohydrates provide vitamins, minerals, and fiber which are an essential part of   a healthy diet. Carbohydrates are found in fruits, vegetables, whole grains, dairy products, legumes, and foods containing added sugars.    Eat healthy foods. Alternate 3 meals with 3 snacks.  Lose weight if overweight.  Carry a medical alert card or wear your medical alert jewelry.  Carry a 15 gram  carbohydrate snack with you at all times to treat low blood glucose (hypoglycemia). Some examples of 15 gram carbohydrate snacks include:  Glucose tablets, 3 or 4   Glucose gel, 15 gram tube  Raisins, 2 tablespoons (24 grams)  Jelly beans, 6  Animal crackers, 8  Regular pop, 4 ounces (120 mL)  Gummy treats, 9  Recognize hypoglycemia. Hypoglycemia occurs with blood glucose levels of 70 mg/dL and below. The risk for hypoglycemia increases when fasting or skipping meals, during or after intense exercise, and during sleep. Hypoglycemia symptoms can include:  Tremors or shakes.  Decreased ability to concentrate.  Sweating.  Increased heart rate.  Headache.  Dry mouth.  Hunger.  Irritability.  Anxiety.  Restless sleep.  Altered speech or coordination.  Confusion.  Treat hypoglycemia promptly. If you are alert and able to safely swallow, follow the 15:15 rule:  Take 15 20 grams of rapid-acting glucose or carbohydrate. Rapid-acting options include glucose gel, glucose tablets, or 4 ounces (120 mL) of fruit juice, regular soda, or low fat milk.  Check your blood glucose level 15 minutes after taking the glucose.  Take 15 20 grams more of glucose if the repeat blood glucose level is still 70 mg/dL or below.  Eat a meal or snack within 1 hour once blood glucose levels return to normal.    Be alert to polyuria and polydipsia which are early signs of hyperglycemia. An early awareness of hyperglycemia allows for prompt treatment. Treat hyperglycemia as directed by your caregiver.  Engage in at least 150 minutes of moderate-intensity physical activity a week, spread over at least 3 days of the week or as directed by your caregiver. In addition, you should engage in resistance exercise at least 2 times a week or as directed by your caregiver.  Adjust your medicine and food intake as needed if you start a new exercise or sport.  Follow your sick day plan at any time you  are unable to eat or drink as usual.  Avoid tobacco use.  Limit alcohol intake to no more than 1 drink per day for nonpregnant women and 2 drinks per day for men. You should drink alcohol only when you are also eating food. Talk with your caregiver whether alcohol is safe for you. Tell your caregiver if you drink alcohol several times a week.  Follow up with your caregiver regularly.  Schedule an eye exam soon after the diagnosis of type 2 diabetes and then annually.  Perform daily skin and foot care. Examine your skin and feet daily for cuts, bruises, redness, nail problems, bleeding, blisters, or sores. A foot exam by a caregiver should be done annually.  Brush your teeth and gums at least twice a day and floss at least once a day. Follow up with your dentist regularly.  Share your diabetes management plan with your workplace or school.  Stay up-to-date with immunizations.  Learn to manage stress.  Obtain ongoing diabetes education and support as needed.  Participate in, or seek rehabilitation as needed to maintain or improve independence and quality of life. Request a physical or occupational therapy referral if you are having foot or hand numbness or difficulties with grooming,   dressing, eating, or physical activity. SEEK MEDICAL CARE IF:   You are unable to eat food or drink fluids for more than 6 hours.  You have nausea and vomiting for more than 6 hours.  Your blood glucose level is over 240 mg/dL.  There is a change in mental status.  You develop an additional serious illness.  You have diarrhea for more than 6 hours.  You have been sick or have had a fever for a couple of days and are not getting better.  You have pain during any physical activity.  SEEK IMMEDIATE MEDICAL CARE IF:  You have difficulty breathing.  You have moderate to large ketone levels. MAKE SURE YOU:  Understand these instructions.  Will watch your condition.  Will get help right away if  you are not doing well or get worse. Document Released: 04/30/2005 Document Revised: 01/23/2012 Document Reviewed: 11/27/2011 [employer] Patient Information 2014 [employer].  

## 2013-09-23 ENCOUNTER — Encounter (INDEPENDENT_AMBULATORY_CARE_PROVIDER_SITE_OTHER): Payer: Self-pay | Admitting: Ophthalmology

## 2013-10-14 ENCOUNTER — Encounter (INDEPENDENT_AMBULATORY_CARE_PROVIDER_SITE_OTHER): Payer: Self-pay | Admitting: Ophthalmology

## 2013-10-16 ENCOUNTER — Ambulatory Visit (INDEPENDENT_AMBULATORY_CARE_PROVIDER_SITE_OTHER): Payer: Medicare Other

## 2013-10-16 DIAGNOSIS — Z23 Encounter for immunization: Secondary | ICD-10-CM

## 2013-10-19 LAB — TB SKIN TEST
Induration: 0 mm
TB SKIN TEST: NEGATIVE

## 2013-11-09 ENCOUNTER — Other Ambulatory Visit: Payer: Self-pay | Admitting: Internal Medicine

## 2014-01-12 ENCOUNTER — Ambulatory Visit (INDEPENDENT_AMBULATORY_CARE_PROVIDER_SITE_OTHER): Payer: Medicare Other | Admitting: Internal Medicine

## 2014-01-12 ENCOUNTER — Encounter: Payer: Self-pay | Admitting: Internal Medicine

## 2014-01-12 VITALS — BP 124/80 | HR 80 | Temp 98.3°F | Wt 217.0 lb

## 2014-01-12 DIAGNOSIS — J208 Acute bronchitis due to other specified organisms: Secondary | ICD-10-CM

## 2014-01-12 DIAGNOSIS — I10 Essential (primary) hypertension: Secondary | ICD-10-CM

## 2014-01-12 DIAGNOSIS — J209 Acute bronchitis, unspecified: Secondary | ICD-10-CM

## 2014-01-12 MED ORDER — AZITHROMYCIN 250 MG PO TABS: ORAL_TABLET | ORAL | Status: DC

## 2014-01-12 MED ORDER — AZILSARTAN-CHLORTHALIDONE 40-25 MG PO TABS: ORAL_TABLET | ORAL | Status: DC

## 2014-01-12 NOTE — Progress Notes (Signed)
Pre visit review using our clinic review tool, if applicable. No additional management support is needed unless otherwise documented below in the visit note. 

## 2014-01-12 NOTE — Patient Instructions (Signed)
Plain Mucinex (NOT D) for thick secretions ;force NON dairy fluids .   Nasal cleansing in the shower as discussed with lather of mild shampoo.After 10 seconds wash off lather while  exhaling through nostrils. Make sure that all residual soap is removed to prevent irritation.  Flonase OR Nasacort AQ 1 spray in each nostril twice a day as needed. Use the "crossover" technique into opposite nostril spraying toward opposite ear @ 45 degree angle, not straight up into nostril.  Use a Neti pot daily only  as needed for significant sinus congestion; going from open side to congested side . Plain Allegra (NOT D )  160 daily , Loratidine 10 mg , OR Zyrtec 10 mg @ bedtime  as needed for itchy eyes & sneezing.  Please do not use Q-tips as we discussed. Should wax build up occur, please put 2-3 drops of mineral oil in the affected  ear at night to soften the wax .Cover the canal with a  cotton ball to prevent the oil from staining bed linens. In the morning fill the ear canal with hydrogen peroxide & lie in the opposite lateral decubitus position(on the side opposite the affected ear)  for 10-15 minutes. After allowing this period of time for the peroxide to dissolve the wax ;shower and use the thinnest washrag available to wick out the wax. If both ears are involved ; alternate this treatment from ear to ear each night until no wax is found on the washrag.

## 2014-01-12 NOTE — Progress Notes (Signed)
   Subjective:    Patient ID: April May, female    DOB: 09/13/1955, 58 y.o.   MRN: [id number]  HPI   Her symptoms began 12/31/13 with expectoration of phlegm from her throat. She had minimal associated sore throat. When she expectorates the material is malodorous and varies in color from clear fascia low-brown.  She has had shortness of breath with her symptoms  She continues to smoke a pack a day.  She denies significant extrinsic symptoms although she has some sneezing. She also denies any signs of rhinosinusitis  Refillof BP meds requested.  Review of Systems  Frontal headache, facial pain , nasal purulence, dental pain,  otic pain or otic discharge denied. No fever , chills or sweats.      Objective:   Physical Exam    Positive or pertinent findings include: She has an upper dental plate. She has a few remaining lower teeth. Nares are dry without exudate. She has increased cerumen in both canals which prevents visualization of the tympanic membranes.   General appearance:No acute distress or increased work of breathing is present.  As per CDC Guidelines ,Epic documents obesity as being present . No  lymphadenopathy about the head, neck, or axilla noted.   Eyes: No conjunctival inflammation or lid edema is present. There is no scleral icterus.  Ears:  External ear exam shows no significant lesions or deformities.    Nose:  External nasal examination shows no deformity or inflammation. No septal dislocation or deviation.No obstruction to airflow.   Oral exam: lips and gums are healthy appearing.There is no oropharyngeal erythema or exudate noted.   Neck:  No deformities, thyromegaly, masses, or tenderness noted.   Supple with full range of motion without pain.   Heart:  Normal rate and regular rhythm. S1 and S2 normal without gallop, murmur, click, rub or other extra sounds.   Lungs:Chest clear to auscultation; no wheezes, rhonchi,rales ,or rubs present.No  increased work of breathing.    Extremities:  No cyanosis, edema, or clubbing  noted    Skin: Warm & dry w/o jaundice or tenting.         Assessment & Plan:  #1 acute bronchitis w/o bronchospasm #2 URI, acute #3 excess cerumen #4 HTN Plan: See orders and recommendations

## 2014-02-23 ENCOUNTER — Telehealth: Payer: Self-pay

## 2014-02-23 NOTE — Telephone Encounter (Signed)
Pt has not had an eye exam.  Stated that she has not had an eye exam since 2013.  She stated that she does not currently have a "personal eye doctor."  Reviewed chart to see if a referral was needed.  Chart indicated that a referral had already been placed and an appointment scheduled back in May of this year.  Pt stated that she was unaware and would look into it.

## 2014-02-24 NOTE — Telephone Encounter (Signed)
Pt stated she does not remember the last she had one but she is scheduled to see Dr. Juliane Lack on 03/10/14.

## 2014-03-03 ENCOUNTER — Telehealth: Payer: Self-pay

## 2014-03-03 NOTE — Telephone Encounter (Signed)
LVM for pt to call back.    RE: needs 2015 AWV scheduled.

## 2014-03-10 ENCOUNTER — Encounter (INDEPENDENT_AMBULATORY_CARE_PROVIDER_SITE_OTHER): Payer: Medicare Other | Admitting: Ophthalmology

## 2014-03-10 DIAGNOSIS — I10 Essential (primary) hypertension: Secondary | ICD-10-CM | POA: Diagnosis not present

## 2014-03-10 DIAGNOSIS — E11319 Type 2 diabetes mellitus with unspecified diabetic retinopathy without macular edema: Secondary | ICD-10-CM | POA: Diagnosis not present

## 2014-03-10 DIAGNOSIS — H35033 Hypertensive retinopathy, bilateral: Secondary | ICD-10-CM | POA: Diagnosis not present

## 2014-03-10 DIAGNOSIS — H43813 Vitreous degeneration, bilateral: Secondary | ICD-10-CM

## 2014-03-10 DIAGNOSIS — E11329 Type 2 diabetes mellitus with mild nonproliferative diabetic retinopathy without macular edema: Secondary | ICD-10-CM

## 2014-03-10 DIAGNOSIS — H2513 Age-related nuclear cataract, bilateral: Secondary | ICD-10-CM

## 2014-03-10 LAB — HM DIABETES EYE EXAM

## 2014-03-11 ENCOUNTER — Ambulatory Visit (INDEPENDENT_AMBULATORY_CARE_PROVIDER_SITE_OTHER): Payer: Medicare Other | Admitting: Internal Medicine

## 2014-03-11 ENCOUNTER — Encounter: Payer: Self-pay | Admitting: Internal Medicine

## 2014-03-11 ENCOUNTER — Ambulatory Visit (INDEPENDENT_AMBULATORY_CARE_PROVIDER_SITE_OTHER)
Admission: RE | Admit: 2014-03-11 | Discharge: 2014-03-11 | Disposition: A | Discharge status: Home / Self Care | Payer: Medicare Other | Source: Ambulatory Visit | Attending: Internal Medicine | Admitting: Internal Medicine

## 2014-03-11 VITALS — BP 102/64 | HR 80 | Temp 97.9°F | Resp 16 | Wt 219.0 lb

## 2014-03-11 DIAGNOSIS — R05 Cough: Secondary | ICD-10-CM

## 2014-03-11 DIAGNOSIS — J449 Chronic obstructive pulmonary disease, unspecified: Secondary | ICD-10-CM

## 2014-03-11 DIAGNOSIS — R059 Cough, unspecified: Secondary | ICD-10-CM

## 2014-03-11 DIAGNOSIS — J13 Pneumonia due to Streptococcus pneumoniae: Secondary | ICD-10-CM | POA: Insufficient documentation

## 2014-03-11 MED ORDER — AMOXICILLIN-POT CLAVULANATE 875-125 MG PO TABS: 1.0000 | ORAL_TABLET | Freq: Two times a day (BID) | ORAL | Status: DC

## 2014-03-11 MED ORDER — UMECLIDINIUM BROMIDE 62.5 MCG/INH IN AEPB: 1.0000 | INHALATION_SPRAY | Freq: Every day | RESPIRATORY_TRACT | Status: DC

## 2014-03-11 NOTE — Patient Instructions (Signed)
Cough, Adult  A cough is a reflex that helps clear your throat and airways. It can help heal the body or may be a reaction to an irritated airway. A cough may only last 2 or 3 weeks (acute) or may last more than 8 weeks (chronic).  CAUSES Acute cough:  Viral or bacterial infections. Chronic cough:  Infections.  Allergies.  Asthma.  Post-nasal drip.  Smoking.  Heartburn or acid reflux.  Some medicines.  Chronic lung problems (COPD).  Cancer. SYMPTOMS   Cough.  Fever.  Chest pain.  Increased breathing rate.  High-pitched whistling sound when breathing (wheezing).  Colored mucus that you cough up (sputum). TREATMENT   A bacterial cough may be treated with antibiotic medicine.  A viral cough must run its course and will not respond to antibiotics.  Your caregiver may recommend other treatments if you have a chronic cough. HOME CARE INSTRUCTIONS   Only take over-the-counter or prescription medicines for pain, discomfort, or fever as directed by your caregiver. Use cough suppressants only as directed by your caregiver.  Use a cold steam vaporizer or humidifier in your bedroom or home to help loosen secretions.  Sleep in a semi-upright position if your cough is worse at night.  Rest as needed.  Stop smoking if you smoke. SEEK IMMEDIATE MEDICAL CARE IF:   You have pus in your sputum.  Your cough starts to worsen.  You cannot control your cough with suppressants and are losing sleep.  You begin coughing up blood.  You have difficulty breathing.  You develop pain which is getting worse or is uncontrolled with medicine.  You have a fever. MAKE SURE YOU:   Understand these instructions.  Will watch your condition.  Will get help right away if you are not doing well or get worse. Document Released: 10/27/2010 Document Revised: 07/23/2011 Document Reviewed: 10/27/2010 [employer] Patient Information 2015 [employer]. This information is not intended  to replace advice given to you by your health care provider. Make sure you discuss any questions you have with your health care provider.  

## 2014-03-11 NOTE — Progress Notes (Signed)
Subjective:    Patient ID: April May, female    DOB: [birth year], 58 y.o.   MRN: [id number]  Cough This is a new problem. The current episode started in the past 7 days. The problem has been gradually worsening. The problem occurs every few hours. The cough is productive of purulent sputum. Associated symptoms include chills, rhinorrhea, a sore throat and wheezing. Pertinent negatives include no chest pain, ear congestion, ear pain, fever, headaches, heartburn, hemoptysis, myalgias, nasal congestion, postnasal drip, rash, shortness of breath, sweats or weight loss. Risk factors for lung disease include smoking/tobacco exposure. She has tried OTC cough suppressant for the symptoms. The treatment provided no relief. Her past medical history is significant for COPD. There is no history of asthma, bronchiectasis, bronchitis, emphysema, environmental allergies or pneumonia.      Review of Systems  Constitutional: Positive for chills. Negative for fever, weight loss, diaphoresis and fatigue.  HENT: Positive for rhinorrhea and sore throat. Negative for ear pain, facial swelling, postnasal drip, sinus pressure, sneezing, tinnitus and trouble swallowing.   Eyes: Negative.   Respiratory: Positive for cough and wheezing. Negative for apnea, hemoptysis, choking, chest tightness and shortness of breath.   Cardiovascular: Negative.  Negative for chest pain, palpitations and leg swelling.  Gastrointestinal: Negative.  Negative for heartburn, nausea, vomiting, abdominal pain, diarrhea, constipation and blood in stool.  Endocrine: Negative.   Genitourinary: Negative.   Musculoskeletal: Negative.  Negative for myalgias.  Skin: Negative.  Negative for rash.  Allergic/Immunologic: Negative.  Negative for environmental allergies.  Neurological: Negative.  Negative for dizziness, tremors, seizures, light-headedness and headaches.  Hematological: Negative.  Negative for adenopathy. Does not bruise/bleed  easily.  Psychiatric/Behavioral: Negative.        Objective:   Physical Exam  Constitutional: She is oriented to person, place, and time. She appears well-developed and well-nourished.  Non-toxic appearance. She does not have a sickly appearance. She does not appear ill. No distress.  HENT:  Head: Normocephalic and atraumatic.  Mouth/Throat: No oropharyngeal exudate.  Eyes: Conjunctivae are normal. Right eye exhibits no discharge. Left eye exhibits no discharge. No scleral icterus.  Neck: Normal range of motion. Neck supple. No JVD present. No tracheal deviation present. No thyromegaly present.  Cardiovascular: Normal rate, regular rhythm, normal heart sounds and intact distal pulses.  Exam reveals no gallop and no friction rub.   No murmur heard. Pulmonary/Chest: Effort normal and breath sounds normal. No stridor. No respiratory distress. She has no wheezes. She has no rales. She exhibits no tenderness.  Abdominal: Soft. Bowel sounds are normal. She exhibits no distension and no mass. There is no tenderness. There is no rebound and no guarding.  Musculoskeletal: Normal range of motion. She exhibits no edema or tenderness.  Lymphadenopathy:    She has no cervical adenopathy.  Neurological: She is oriented to person, place, and time.  Skin: Skin is warm and dry. No rash noted. She is not diaphoretic. No erythema. No pallor.  Psychiatric: She has a normal mood and affect. Her behavior is normal. Judgment and thought content normal.  Vitals reviewed.    Lab Results  Component Value Date   WBC 12.3* 09/14/2013   HGB 14.1 09/14/2013   HCT 42.2 09/14/2013   PLT 356.0 09/14/2013   GLUCOSE 108* 09/14/2013   CHOL 168 01/14/2013   TRIG 110.0 01/14/2013   HDL 34.70* 01/14/2013   LDLCALC 111* 01/14/2013   ALT 9 03/28/2013   AST 14 03/28/2013   NA 139 09/14/2013  K 3.6 09/14/2013   CL 101 09/14/2013   CREATININE 0.9 09/14/2013   BUN 10 09/14/2013   CO2 30 09/14/2013   TSH 0.57 03/04/2013   HGBA1C 6.3 09/14/2013     MICROALBUR 0.50 01/30/2010       Assessment & Plan:

## 2014-03-12 ENCOUNTER — Telehealth: Payer: Self-pay | Admitting: Internal Medicine

## 2014-03-12 NOTE — Telephone Encounter (Signed)
She should stay out until Monday

## 2014-03-12 NOTE — Telephone Encounter (Signed)
emmi emailed °

## 2014-03-12 NOTE — Telephone Encounter (Signed)
[phone] Please let pt know if she can go to work today, she was diagnosed with pneumonia yesterday. No fever. 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 Please advise.

## 2014-03-14 NOTE — Assessment & Plan Note (Signed)
I think she would benefit from starting an anticholinergic so I gave her samples of incruse, I should her how to use it and she demonstrated proficiency with its use. I have asked her to quit smoking.

## 2014-03-14 NOTE — Assessment & Plan Note (Signed)
I will treat the infection with augmentin 

## 2014-03-14 NOTE — Assessment & Plan Note (Signed)
Her CXR is normal.

## 2014-03-25 ENCOUNTER — Telehealth: Payer: Self-pay | Admitting: Internal Medicine

## 2014-03-25 ENCOUNTER — Ambulatory Visit (INDEPENDENT_AMBULATORY_CARE_PROVIDER_SITE_OTHER): Payer: Medicare Other | Admitting: Internal Medicine

## 2014-03-25 DIAGNOSIS — R05 Cough: Secondary | ICD-10-CM

## 2014-03-25 DIAGNOSIS — J449 Chronic obstructive pulmonary disease, unspecified: Secondary | ICD-10-CM

## 2014-03-25 DIAGNOSIS — J4489 Other specified chronic obstructive pulmonary disease: Secondary | ICD-10-CM

## 2014-03-25 DIAGNOSIS — R059 Cough, unspecified: Secondary | ICD-10-CM

## 2014-03-25 LAB — PULMONARY FUNCTION TEST
DL/VA % PRED: 105 %
DL/VA: 4.79 ml/min/mmHg/L
DLCO unc % pred: 89 %
DLCO unc: 19.19 ml/min/mmHg
FEF 25-75 PRE: 2.88 L/s
FEF 25-75 Post: 3.41 L/sec
FEF2575-%CHANGE-POST: 18 %
FEF2575-%PRED-PRE: 144 %
FEF2575-%Pred-Post: 171 %
FEV1-%CHANGE-POST: 4 %
FEV1-%PRED-POST: 119 %
FEV1-%PRED-PRE: 114 %
FEV1-Post: 2.33 L
FEV1-Pre: 2.23 L
FEV1FVC-%Change-Post: 3 %
FEV1FVC-%PRED-PRE: 108 %
FEV6-%Change-Post: 0 %
FEV6-%PRED-POST: 108 %
FEV6-%Pred-Pre: 107 %
FEV6-POST: 2.59 L
FEV6-Pre: 2.56 L
FEV6FVC-%PRED-PRE: 104 %
FEV6FVC-%Pred-Post: 104 %
FVC-%CHANGE-POST: 1 %
FVC-%PRED-PRE: 103 %
FVC-%Pred-Post: 104 %
FVC-Post: 2.6 L
FVC-Pre: 2.56 L
PRE FEV1/FVC RATIO: 87 %
PRE FEV6/FVC RATIO: 100 %
Post FEV1/FVC ratio: 90 %
Post FEV6/FVC ratio: 100 %
RV % pred: 101 %
RV: 1.89 L
TLC % pred: 94 %
TLC: 4.49 L

## 2014-03-25 NOTE — Progress Notes (Signed)
PFT done today. Katie Welchel,CMA  

## 2014-03-25 NOTE — Telephone Encounter (Signed)
Pt came by office because she has a breathing test done with pulmonary and would like someone to call her when the test has resulted. Please contact pt when request is reviewed. She wants to receive a verbal result.

## 2014-03-26 ENCOUNTER — Encounter: Payer: Self-pay | Admitting: Internal Medicine

## 2014-03-26 NOTE — Telephone Encounter (Signed)
Her breathing test was normal  T. Ronnald Ramp

## 2014-03-30 ENCOUNTER — Ambulatory Visit: Payer: Medicare Other | Admitting: Internal Medicine

## 2014-03-31 ENCOUNTER — Encounter: Payer: Self-pay | Admitting: Internal Medicine

## 2014-04-02 ENCOUNTER — Encounter: Payer: Self-pay | Admitting: Internal Medicine

## 2014-04-05 ENCOUNTER — Emergency Department (HOSPITAL_COMMUNITY): Payer: Medicare Other

## 2014-04-05 ENCOUNTER — Inpatient Hospital Stay (HOSPITAL_COMMUNITY)
Admission: EM | Admit: 2014-04-05 | Discharge: 2014-04-08 | DRG: 385 | Disposition: A | Discharge status: Home / Self Care | Payer: Medicare Other | Attending: Internal Medicine | Admitting: Internal Medicine

## 2014-04-05 ENCOUNTER — Encounter (HOSPITAL_COMMUNITY): Payer: Self-pay | Admitting: Family Medicine

## 2014-04-05 DIAGNOSIS — F329 Major depressive disorder, single episode, unspecified: Secondary | ICD-10-CM | POA: Diagnosis present

## 2014-04-05 DIAGNOSIS — I10 Essential (primary) hypertension: Secondary | ICD-10-CM | POA: Diagnosis present

## 2014-04-05 DIAGNOSIS — Z6838 Body mass index (BMI) 38.0-38.9, adult: Secondary | ICD-10-CM | POA: Diagnosis not present

## 2014-04-05 DIAGNOSIS — E1165 Type 2 diabetes mellitus with hyperglycemia: Secondary | ICD-10-CM | POA: Diagnosis present

## 2014-04-05 DIAGNOSIS — R109 Unspecified abdominal pain: Secondary | ICD-10-CM | POA: Diagnosis present

## 2014-04-05 DIAGNOSIS — E669 Obesity, unspecified: Secondary | ICD-10-CM | POA: Diagnosis present

## 2014-04-05 DIAGNOSIS — F17210 Nicotine dependence, cigarettes, uncomplicated: Secondary | ICD-10-CM | POA: Diagnosis present

## 2014-04-05 DIAGNOSIS — E860 Dehydration: Secondary | ICD-10-CM | POA: Diagnosis present

## 2014-04-05 DIAGNOSIS — D72829 Elevated white blood cell count, unspecified: Secondary | ICD-10-CM

## 2014-04-05 DIAGNOSIS — K651 Peritoneal abscess: Secondary | ICD-10-CM | POA: Diagnosis present

## 2014-04-05 DIAGNOSIS — IMO0001 Reserved for inherently not codable concepts without codable children: Secondary | ICD-10-CM | POA: Diagnosis present

## 2014-04-05 DIAGNOSIS — K50014 Crohn's disease of small intestine with abscess: Principal | ICD-10-CM | POA: Diagnosis present

## 2014-04-05 DIAGNOSIS — E876 Hypokalemia: Secondary | ICD-10-CM | POA: Diagnosis present

## 2014-04-05 DIAGNOSIS — IMO0002 Reserved for concepts with insufficient information to code with codable children: Secondary | ICD-10-CM | POA: Diagnosis present

## 2014-04-05 DIAGNOSIS — Z79899 Other long term (current) drug therapy: Secondary | ICD-10-CM

## 2014-04-05 DIAGNOSIS — Z720 Tobacco use: Secondary | ICD-10-CM

## 2014-04-05 DIAGNOSIS — F17200 Nicotine dependence, unspecified, uncomplicated: Secondary | ICD-10-CM | POA: Diagnosis present

## 2014-04-05 DIAGNOSIS — T502X5A Adverse effect of carbonic-anhydrase inhibitors, benzothiadiazides and other diuretics, initial encounter: Secondary | ICD-10-CM

## 2014-04-05 LAB — CBC WITH DIFFERENTIAL/PLATELET
BASOS PCT: 0 % (ref 0–1)
Basophils Absolute: 0 10*3/uL (ref 0.0–0.1)
EOS ABS: 0.3 10*3/uL (ref 0.0–0.7)
EOS PCT: 2 % (ref 0–5)
HEMATOCRIT: 43.2 % (ref 36.0–46.0)
Hemoglobin: 14.2 g/dL (ref 12.0–15.0)
Lymphocytes Relative: 16 % (ref 12–46)
Lymphs Abs: 2.9 10*3/uL (ref 0.7–4.0)
MCH: 30 pg (ref 26.0–34.0)
MCHC: 32.9 g/dL (ref 30.0–36.0)
MCV: 91.3 fL (ref 78.0–100.0)
MONO ABS: 1.6 10*3/uL — AB (ref 0.1–1.0)
Monocytes Relative: 9 % (ref 3–12)
Neutro Abs: 13.4 10*3/uL — ABNORMAL HIGH (ref 1.7–7.7)
Neutrophils Relative %: 73 % (ref 43–77)
Platelets: 339 10*3/uL (ref 150–400)
RBC: 4.73 MIL/uL (ref 3.87–5.11)
RDW: 13.8 % (ref 11.5–15.5)
WBC: 18.2 10*3/uL — ABNORMAL HIGH (ref 4.0–10.5)

## 2014-04-05 LAB — COMPREHENSIVE METABOLIC PANEL
ALBUMIN: 3.5 g/dL (ref 3.5–5.2)
ALT: 11 U/L (ref 0–35)
AST: 14 U/L (ref 0–37)
Alkaline Phosphatase: 81 U/L (ref 39–117)
Anion gap: 15 (ref 5–15)
BUN: 14 mg/dL (ref 6–23)
CALCIUM: 9.2 mg/dL (ref 8.4–10.5)
CO2: 23 mEq/L (ref 19–32)
CREATININE: 0.82 mg/dL (ref 0.50–1.10)
Chloride: 99 mEq/L (ref 96–112)
GFR calc Af Amer: 90 mL/min — ABNORMAL LOW (ref >90)
GFR, EST NON AFRICAN AMERICAN: 77 mL/min — AB (ref >90)
Glucose, Bld: 124 mg/dL — ABNORMAL HIGH (ref 70–99)
Potassium: 3.5 mEq/L — ABNORMAL LOW (ref 3.7–5.3)
SODIUM: 137 meq/L (ref 137–147)
TOTAL PROTEIN: 7.8 g/dL (ref 6.0–8.3)
Total Bilirubin: 0.2 mg/dL — ABNORMAL LOW (ref 0.3–1.2)

## 2014-04-05 LAB — GLUCOSE, CAPILLARY: GLUCOSE-CAPILLARY: 129 mg/dL — AB (ref 70–99)

## 2014-04-05 LAB — URINALYSIS, ROUTINE W REFLEX MICROSCOPIC
Bilirubin Urine: NEGATIVE
Glucose, UA: NEGATIVE mg/dL
Hgb urine dipstick: NEGATIVE
Ketones, ur: NEGATIVE mg/dL
LEUKOCYTES UA: NEGATIVE
NITRITE: NEGATIVE
PH: 5 (ref 5.0–8.0)
Protein, ur: NEGATIVE mg/dL
SPECIFIC GRAVITY, URINE: 1.044 — AB (ref 1.005–1.030)
Urobilinogen, UA: 0.2 mg/dL (ref 0.0–1.0)

## 2014-04-05 LAB — LIPASE, BLOOD: LIPASE: 35 U/L (ref 11–59)

## 2014-04-05 MED ORDER — ALBUTEROL SULFATE (2.5 MG/3ML) 0.083% IN NEBU: 2.5000 mg | INHALATION_SOLUTION | RESPIRATORY_TRACT | Status: DC | PRN

## 2014-04-05 MED ORDER — LAMOTRIGINE 200 MG PO TABS
200.0000 mg | ORAL_TABLET | Freq: Every day | ORAL | Status: DC
  Administered 2014-04-05 – 2014-04-07 (×3): 200 mg via ORAL
  Filled 2014-04-05 (×4): qty 1

## 2014-04-05 MED ORDER — IRBESARTAN 300 MG PO TABS
300.0000 mg | ORAL_TABLET | Freq: Every day | ORAL | Status: DC
  Administered 2014-04-06: 300 mg via ORAL
  Filled 2014-04-05 (×2): qty 1

## 2014-04-05 MED ORDER — SODIUM CHLORIDE 0.9 % IV SOLN
INTRAVENOUS | Status: DC
  Administered 2014-04-05: 19:00:00 via INTRAVENOUS

## 2014-04-05 MED ORDER — BUPROPION HCL ER (SR) 100 MG PO TB12
100.0000 mg | ORAL_TABLET | Freq: Every day | ORAL | Status: DC
  Administered 2014-04-05 – 2014-04-07 (×3): 100 mg via ORAL
  Filled 2014-04-05 (×4): qty 1

## 2014-04-05 MED ORDER — PIPERACILLIN-TAZOBACTAM 3.375 G IVPB
3.3750 g | Freq: Three times a day (TID) | INTRAVENOUS | Status: DC
  Administered 2014-04-06 – 2014-04-07 (×5): 3.375 g via INTRAVENOUS
  Filled 2014-04-05 (×6): qty 50

## 2014-04-05 MED ORDER — FLUTICASONE PROPIONATE 50 MCG/ACT NA SUSP
2.0000 | Freq: Every day | NASAL | Status: DC | PRN
  Filled 2014-04-05: qty 16

## 2014-04-05 MED ORDER — METRONIDAZOLE IN NACL 5-0.79 MG/ML-% IV SOLN
500.0000 mg | Freq: Once | INTRAVENOUS | Status: AC
  Administered 2014-04-05: 500 mg via INTRAVENOUS
  Filled 2014-04-05: qty 100

## 2014-04-05 MED ORDER — IOHEXOL 300 MG/ML  SOLN
80.0000 mL | Freq: Once | INTRAMUSCULAR | Status: AC | PRN
  Administered 2014-04-05: 100 mL via INTRAVENOUS

## 2014-04-05 MED ORDER — ENOXAPARIN SODIUM 40 MG/0.4ML ~~LOC~~ SOLN
40.0000 mg | SUBCUTANEOUS | Status: DC
  Administered 2014-04-05 – 2014-04-07 (×3): 40 mg via SUBCUTANEOUS
  Filled 2014-04-05 (×5): qty 0.4

## 2014-04-05 MED ORDER — POTASSIUM CHLORIDE CRYS ER 20 MEQ PO TBCR
40.0000 meq | EXTENDED_RELEASE_TABLET | Freq: Once | ORAL | Status: AC
  Administered 2014-04-05: 40 meq via ORAL
  Filled 2014-04-05: qty 2

## 2014-04-05 MED ORDER — ONDANSETRON HCL 4 MG PO TABS: 4.0000 mg | ORAL_TABLET | Freq: Four times a day (QID) | ORAL | Status: DC | PRN

## 2014-04-05 MED ORDER — MORPHINE SULFATE 4 MG/ML IJ SOLN
4.0000 mg | Freq: Once | INTRAMUSCULAR | Status: AC
  Administered 2014-04-05: 4 mg via INTRAVENOUS
  Filled 2014-04-05: qty 1

## 2014-04-05 MED ORDER — LUBIPROSTONE 24 MCG PO CAPS
24.0000 ug | ORAL_CAPSULE | Freq: Every day | ORAL | Status: DC
  Administered 2014-04-05 – 2014-04-06 (×2): 24 ug via ORAL
  Filled 2014-04-05 (×3): qty 1

## 2014-04-05 MED ORDER — ACETAMINOPHEN 325 MG PO TABS
650.0000 mg | ORAL_TABLET | Freq: Four times a day (QID) | ORAL | Status: DC | PRN
  Administered 2014-04-07: 650 mg via ORAL
  Filled 2014-04-05: qty 2

## 2014-04-05 MED ORDER — PIPERACILLIN-TAZOBACTAM 3.375 G IVPB 30 MIN
3.3750 g | Freq: Once | INTRAVENOUS | Status: AC
  Administered 2014-04-05: 3.375 g via INTRAVENOUS
  Filled 2014-04-05: qty 50

## 2014-04-05 MED ORDER — METHYLPREDNISOLONE SODIUM SUCC 40 MG IJ SOLR
40.0000 mg | Freq: Two times a day (BID) | INTRAMUSCULAR | Status: DC
  Administered 2014-04-05 – 2014-04-07 (×4): 40 mg via INTRAVENOUS
  Filled 2014-04-05 (×6): qty 1

## 2014-04-05 MED ORDER — HYDROCODONE-ACETAMINOPHEN 5-325 MG PO TABS
1.0000 | ORAL_TABLET | ORAL | Status: DC | PRN
  Administered 2014-04-05: 2 via ORAL
  Administered 2014-04-06 – 2014-04-07 (×2): 1 via ORAL
  Filled 2014-04-05: qty 2
  Filled 2014-04-05: qty 1
  Filled 2014-04-05: qty 2

## 2014-04-05 MED ORDER — CHLORTHALIDONE 25 MG PO TABS
25.0000 mg | ORAL_TABLET | Freq: Every day | ORAL | Status: DC
  Administered 2014-04-06: 25 mg via ORAL
  Filled 2014-04-05 (×3): qty 1

## 2014-04-05 MED ORDER — AZILSARTAN-CHLORTHALIDONE 40-25 MG PO TABS: 1.0000 | ORAL_TABLET | Freq: Every day | ORAL | Status: DC

## 2014-04-05 MED ORDER — MORPHINE SULFATE 2 MG/ML IJ SOLN: 2.0000 mg | INTRAMUSCULAR | Status: DC | PRN

## 2014-04-05 MED ORDER — ONDANSETRON HCL 4 MG/2ML IJ SOLN
4.0000 mg | Freq: Four times a day (QID) | INTRAMUSCULAR | Status: DC | PRN
  Filled 2014-04-05: qty 2

## 2014-04-05 MED ORDER — METRONIDAZOLE IN NACL 5-0.79 MG/ML-% IV SOLN
500.0000 mg | Freq: Three times a day (TID) | INTRAVENOUS | Status: DC
  Administered 2014-04-06 – 2014-04-07 (×5): 500 mg via INTRAVENOUS
  Filled 2014-04-05 (×7): qty 100

## 2014-04-05 MED ORDER — IOHEXOL 300 MG/ML  SOLN
25.0000 mL | INTRAMUSCULAR | Status: AC
  Administered 2014-04-05: 25 mL via ORAL

## 2014-04-05 MED ORDER — ACETAMINOPHEN 650 MG RE SUPP: 650.0000 mg | Freq: Four times a day (QID) | RECTAL | Status: DC | PRN

## 2014-04-05 NOTE — Progress Notes (Signed)
ANTIBIOTIC CONSULT NOTE - INITIAL  Pharmacy Consult for Zosyn Indication: intra-abdominal infection  Allergies  Allergen Reactions  . Lisinopril     cough  . Crestor [Rosuvastatin Calcium]     Muscle and joint aches  . Lipitor [Atorvastatin Calcium]     Muscle cramps  . Azathioprine Other (See Comments)    Side effects to pancrease       Patient Measurements:  Vital Signs: Temp: 98.8 F (37.1 C) (11/23 1142) Temp Source: Oral (11/23 1142) BP: 106/54 mmHg (11/23 1600) Pulse Rate: 80 (11/23 1600) Intake/Output from previous day:   Intake/Output from this shift:    Labs:  Recent Labs  04/05/14 1152  WBC 18.2*  HGB 14.2  PLT 339  CREATININE 0.82   CrCl cannot be calculated (Unknown ideal weight.). No results for input(s): VANCOTROUGH, VANCOPEAK, VANCORANDOM, GENTTROUGH, GENTPEAK, GENTRANDOM, TOBRATROUGH, TOBRAPEAK, TOBRARND, AMIKACINPEAK, AMIKACINTROU, AMIKACIN in the last 72 hours.   Microbiology: No results found for this or any previous visit (from the past 720 hour(s)).  Medical History: Past Medical History  Diagnosis Date  . Allergy   . Depression   . Hypertension   . Crohn disease   . Diabetes mellitus type II     Medications:  Scheduled:  . morphine  4 mg Intravenous Once   Assessment: 58 yo f who presented to the ED on 11/23 for abdominal pain.  Pharmacy is consulted to begin zosyn for an intra-abdominal infection.  Wbc 18.2, tmax 98.8, SCr 0.82.  Goal of Therapy:  Eradication of infection  Plan:  Zosyn 3.375 gm IV x 1 (30-min infusion) Followed by zosyn 3.375 gm IV q8hrs Monitor CBC, renal function, C&S, clinical course  Cassie L. Nicole Kindred, PharmD Clinical Pharmacy Resident Pager: [phone] 04/05/2014 5:35 PM

## 2014-04-05 NOTE — ED Notes (Signed)
CT notified pt has completed PO contrast

## 2014-04-05 NOTE — Consult Note (Signed)
Reason for Consult: Crohn's Flare Referring Physician: [facility]  April May HPI: This is a 58 year old female who is well-known to me for her Crohn's disease who is admitted with a flare of her disease with a RLQ abscess.  She states that her abdomen was uncomfortable for the past two weeks, but she thinks that it started earlier this Summer when I saw her for constipation complaints.  As the discomfort worsened/prolonged she presented to the ER for further evaluation and treatment.  This is when the CT scan revealed the inflammation and RLQ abscess.  No reports of any fever, nausea, vomiting, or diarrhea.  Past Medical History  Diagnosis Date  . Allergy   . Depression   . Hypertension   . Crohn disease   . Diabetes mellitus type II     Past Surgical History  Procedure Laterality Date  . Appendectomy    . Abdominal hysterectomy    . Small intestine surgery    . Breast surgery      Family History  Problem Relation Age of Onset  . Hypertension Mother   . Arthritis Father   . Alcohol abuse Paternal Uncle   . Diabetes Paternal Grandmother   . Stroke Paternal Grandmother     Social History:  reports that she has been smoking Cigarettes.  She has a 35 pack-year smoking history. She has never used smokeless tobacco. She reports that she does not drink alcohol or use illicit drugs.  Allergies:  Allergies  Allergen Reactions  . Lisinopril     cough  . Crestor [Rosuvastatin Calcium]     Muscle and joint aches  . Lipitor [Atorvastatin Calcium]     Muscle cramps  . Azathioprine Other (See Comments)    Side effects to pancrease       Medications:  Scheduled: . buPROPion  100 mg Oral QHS  . enoxaparin (LOVENOX) injection  40 mg Subcutaneous Q24H  . lamoTRIgine  200 mg Oral QHS  . lubiprostone  24 mcg Oral QHS  . methylPREDNISolone (SOLU-MEDROL) injection  40 mg Intravenous Q12H  . metronidazole  500 mg Intravenous Once  . [START ON 04/06/2014] metronidazole   500 mg Intravenous Q8H  . [START ON 04/06/2014] piperacillin-tazobactam (ZOSYN)  IV  3.375 g Intravenous Q8H    Results for orders placed or performed during the hospital encounter of 04/05/14 (from the past 24 hour(s))  CBC with Differential     Status: Abnormal   Collection Time: 04/05/14 11:52 AM  Result Value Ref Range   WBC 18.2 (H) 4.0 - 10.5 K/uL   RBC 4.73 3.87 - 5.11 MIL/uL   Hemoglobin 14.2 12.0 - 15.0 g/dL   HCT 43.2 36.0 - 46.0 %   MCV 91.3 78.0 - 100.0 fL   MCH 30.0 26.0 - 34.0 pg   MCHC 32.9 30.0 - 36.0 g/dL   RDW 13.8 11.5 - 15.5 %   Platelets 339 150 - 400 K/uL   Neutrophils Relative % 73 43 - 77 %   Neutro Abs 13.4 (H) 1.7 - 7.7 K/uL   Lymphocytes Relative 16 12 - 46 %   Lymphs Abs 2.9 0.7 - 4.0 K/uL   Monocytes Relative 9 3 - 12 %   Monocytes Absolute 1.6 (H) 0.1 - 1.0 K/uL   Eosinophils Relative 2 0 - 5 %   Eosinophils Absolute 0.3 0.0 - 0.7 K/uL   Basophils Relative 0 0 - 1 %   Basophils Absolute 0.0 0.0 - 0.1 K/uL  Comprehensive metabolic panel     Status: Abnormal   Collection Time: 04/05/14 11:52 AM  Result Value Ref Range   Sodium 137 137 - 147 mEq/L   Potassium 3.5 (L) 3.7 - 5.3 mEq/L   Chloride 99 96 - 112 mEq/L   CO2 23 19 - 32 mEq/L   Glucose, Bld 124 (H) 70 - 99 mg/dL   BUN 14 6 - 23 mg/dL   Creatinine, Ser 0.82 0.50 - 1.10 mg/dL   Calcium 9.2 8.4 - 10.5 mg/dL   Total Protein 7.8 6.0 - 8.3 g/dL   Albumin 3.5 3.5 - 5.2 g/dL   AST 14 0 - 37 U/L   ALT 11 0 - 35 U/L   Alkaline Phosphatase 81 39 - 117 U/L   Total Bilirubin 0.2 (L) 0.3 - 1.2 mg/dL   GFR calc non Af Amer 77 (L) >90 mL/min   GFR calc Af Amer 90 (L) >90 mL/min   Anion gap 15 5 - 15  Lipase, blood     Status: None   Collection Time: 04/05/14 11:52 AM  Result Value Ref Range   Lipase 35 11 - 59 U/L  Urinalysis, Routine w reflex microscopic     Status: Abnormal   Collection Time: 04/05/14  5:05 PM  Result Value Ref Range   Color, Urine YELLOW YELLOW   APPearance CLEAR CLEAR    Specific Gravity, Urine 1.044 (H) 1.005 - 1.030   pH 5.0 5.0 - 8.0   Glucose, UA NEGATIVE NEGATIVE mg/dL   Hgb urine dipstick NEGATIVE NEGATIVE   Bilirubin Urine NEGATIVE NEGATIVE   Ketones, ur NEGATIVE NEGATIVE mg/dL   Protein, ur NEGATIVE NEGATIVE mg/dL   Urobilinogen, UA 0.2 0.0 - 1.0 mg/dL   Nitrite NEGATIVE NEGATIVE   Leukocytes, UA NEGATIVE NEGATIVE     Ct Abdomen Pelvis W Contrast  04/05/2014   CLINICAL DATA:  Midline to RIGHT-side abdominal pain at upper and lower abdomen and nausea for 2 weeks, history diabetes and Crohn's disease, prior hysterectomy, appendectomy, small bowel resection  EXAM: CT ABDOMEN AND PELVIS WITH CONTRAST  TECHNIQUE: Multidetector CT imaging of the abdomen and pelvis was performed using the standard protocol following bolus administration of intravenous contrast. Sagittal and coronal MPR images reconstructed from axial data set.  CONTRAST:  139m OMNIPAQUE IOHEXOL 300 MG/ML SOLN IV. Dilute oral contrast.  COMPARISON:  04/13/2012  FINDINGS: Minimal chronic bibasilar atelectasis.  Tiny cyst anteriorly in liver 5 mm diameter image 16 unchanged.  Liver, spleen, pancreas, kidneys, and adrenal glands otherwise normal.  Prior ileocolic resection and anastomosis.  Focal inflammatory process identified in the RIGHT lower quadrant/pelvis, where multiple clustered small bowel loops and intervening and surrounding inflammatory changes are identified most likely related to Crohn's disease.  Several small fluid collections are associated with this inflammatory site though, due to lack of opacification of the distal small bowel loops, it is uncertain whether these represent unopacified small bowel loops or small abscess collections.  I am suspicious that at least the more anterior collection represents an abscess as this demonstrates slight wall thickening and enhancement, measuring 3.0 x 1.4 x potentially 3.7 cm.  No free intraperitoneal air or fluid identified.  Fluid filled  nondistended RIGHT colon.  Remaining bowel loops normal without bowel dilatation or evidence of obstruction.  Small hiatal hernia.  Numerous normal sized mesenteric lymph nodes without gross adenopathy.  Atherosclerotic calcifications aorta.  No acute osseous findings.  IMPRESSION: Cluster of inflamed small bowel loops in the RIGHT mid abdomen/pelvis  consistent with Crohn's disease and significant peri-intestinal inflammation.  Several fluid attenuation collections are associated with this phlegmon, some which certainly represent small bowel loops but am suspicious that there is at least an anterior abscess collection measuring 3.0 x 1.4 x 3.7 cm.  No evidence of bowel obstruction or free intraperitoneal air.   Electronically Signed   By: Lavonia Dana M.D.   On: 04/05/2014 16:29    ROS:  As stated above in the HPI otherwise negative.  Blood pressure 86/50, pulse 80, temperature 99.3 F (37.4 C), temperature source Oral, resp. rate 18, height 5' 2"  (1.575 m), weight 98.2 kg (216 lb 7.9 oz), last menstrual period 05/14/1988, SpO2 97 %.    PE: Gen: NAD, Alert and Oriented HEENT:  [place]/AT, EOMI Neck: Supple, no LAD Lungs: CTA Bilaterally CV: RRR without M/G/R ABM: Soft, NTND, +BS Ext: No C/C/E  Assessment/Plan: 1) Crohn's flare. 2) RLQ abscess.   I know the patient very well.  The last time she had a flare was in 2012.  It was a mild flare at that time and she was started on steroids, which helped to decrease the inflammation.  Unfortunately she developed a buffalo hump with only a couple of weeks steroid treatment.  In the past AZA was tried, but she developed pancreatitis.  Plan: 1) Antibiotics. 2) Solumedrol. 3) In the future she will need to be started on a biologic.  I had a discussion with her about this issue, but it is a bit much for her to take in at this time.  Her mother died of infectious complications from Crohn's disease two months ago.  Lacy Taglieri D 04/05/2014, 6:34 PM

## 2014-04-05 NOTE — H&P (Signed)
History and Physical  April May BMW:[id number] DOB: [birth year] DOA: 04/05/2014  Referring physician: Quincy Carnes, ED PA-C PCP: Scarlette Calico, MD  Outpatient Specialists:  1. GI- Dr. Carol Ada.  Chief Complaint: Abdominal pain  HPI: April May is a 58 y.o. female with history of Crohn's disease said to be in remission for the last few years, hypertension, DM 2, tobacco abuse, pancreatitis due to Azathioprine, presented to the ED with abdominal pain. She states that she has not been on medications for Crohn's disease which has been relatively stable until 3 weeks ago when she started experiencing mostly right-sided intermittent abdominal pain. The pain was described as cramping crescendo-decrescendo in nature, radiating to the back, worse on standing or lying down, worse with oral intake, associated with poor oral intake, no relieving factors, not associated with fevers but associated with chills which she attributes to hot flashes as well. She saw her gastroenterologist 3 weeks ago for constipation and was given samples of Amitiza which she took and has been having some BMs but no significant relief in her abdominal pain. Thereby she presented to the ED where WBC 18.2 and CT abdomen shows flareup of Crohn's disease along with an abscess. ED staff has discussed CT findings with the surgeon on call who recommends IV antibiotics and no percutaneous drainage secondary to risk of fistula formation. General surgery and GI have been consulted by EDP. Hospitalist admission requested.   Review of Systems: All systems reviewed and apart from history of presenting illness, are negative.  Past Medical History  Diagnosis Date  . Allergy   . Depression   . Hypertension   . Crohn disease   . Diabetes mellitus type II    Past Surgical History  Procedure Laterality Date  . Appendectomy    . Abdominal hysterectomy    . Small intestine surgery    . Breast surgery     Social  History:  reports that she has been smoking Cigarettes.  She has a 35 pack-year smoking history. She has never used smokeless tobacco. She reports that she does not drink alcohol or use illicit drugs. Single. Lives alone. Independent of activities of daily living.  Allergies  Allergen Reactions  . Lisinopril     cough  . Crestor [Rosuvastatin Calcium]     Muscle and joint aches  . Lipitor [Atorvastatin Calcium]     Muscle cramps  . Azathioprine Other (See Comments)    Side effects to pancrease       Family History  Problem Relation Age of Onset  . Hypertension Mother   . Arthritis Father   . Alcohol abuse Paternal Uncle   . Diabetes Paternal Grandmother   . Stroke Paternal Grandmother      Prior to Admission medications   Medication Sig Start Date End Date Taking? Authorizing Provider  AMITIZA 24 MCG capsule Take 24 mcg by mouth at bedtime. 03/19/14  Yes Historical Provider, MD  Azilsartan-Chlorthalidone (EDARBYCLOR) 40-25 MG TABS TAKE 1 TABLET BY MOUTH DAILY. 01/12/14  Yes Hendricks Limes, MD  buPROPion [facility] SR) 100 MG 12 hr tablet Take 100 mg by mouth at bedtime.   Yes Historical Provider, MD  fluticasone (FLONASE) 50 MCG/ACT nasal spray Place 2 sprays into both nostrils daily as needed for allergies or rhinitis. 09/14/13  Yes Janith Lima, MD  lamoTRIgine (LAMICTAL) 200 MG tablet Take 200 mg by mouth at bedtime.   Yes Historical Provider, MD  amoxicillin-clavulanate (AUGMENTIN) 875-125 MG per tablet  Take 1 tablet by mouth 2 (two) times daily. 03/11/14   Janith Lima, MD  Umeclidinium Bromide (INCRUSE ELLIPTA) 62.5 MCG/INH AEPB Inhale 1 puff into the lungs daily. Patient not taking: Reported on 04/05/2014 03/11/14   Janith Lima, MD   Physical Exam: Filed Vitals:   04/05/14 1315 04/05/14 1415 04/05/14 1600 04/05/14 1730  BP:   106/54 101/52  Pulse: 80 78 80 84  Temp:      TempSrc:      Resp: 23 25 18 20   SpO2: 97% 96% 97% 98%   temperature 99.29F   General  exam: Moderately built and nourished pleasant young female patient, lying comfortably supine on the gurney in no obvious distress. Does not look septic or toxic.  Head, eyes and ENT: Nontraumatic and normocephalic. Pupils equally reacting to light and accommodation. Oral mucosa slightly dry and tobacco breath.  Neck: Supple. No JVD, carotid bruit or thyromegaly.  Lymphatics: No lymphadenopathy.  Respiratory system: Clear to auscultation. No increased work of breathing.  Cardiovascular system: S1 and S2 heard, RRR. No JVD, murmurs, gallops, clicks or pedal edema.  Gastrointestinal system: Abdomen is nondistended, soft and mild tenderness in the right mid quadrant, right lower quadrant and suprapubic region without rigidity, guarding or rebound. Normal bowel sounds heard. No organomegaly or masses appreciated.  Central nervous system: Alert and oriented. No focal neurological deficits.  Extremities: Symmetric 5 x 5 power. Peripheral pulses symmetrically felt.   Skin: No rashes or acute findings.  Musculoskeletal system: Negative exam.  Psychiatry: Pleasant and cooperative.   Labs on Admission:  Basic Metabolic Panel:  Recent Labs Lab 04/05/14 1152  NA 137  K 3.5*  CL 99  CO2 23  GLUCOSE 124*  BUN 14  CREATININE 0.82  CALCIUM 9.2   Liver Function Tests:  Recent Labs Lab 04/05/14 1152  AST 14  ALT 11  ALKPHOS 81  BILITOT 0.2*  PROT 7.8  ALBUMIN 3.5    Recent Labs Lab 04/05/14 1152  LIPASE 35   No results for input(s): AMMONIA in the last 168 hours. CBC:  Recent Labs Lab 04/05/14 1152  WBC 18.2*  NEUTROABS 13.4*  HGB 14.2  HCT 43.2  MCV 91.3  PLT 339   Cardiac Enzymes: No results for input(s): CKTOTAL, CKMB, CKMBINDEX, TROPONINI in the last 168 hours.  BNP (last 3 results) No results for input(s): PROBNP in the last 8760 hours. CBG: No results for input(s): GLUCAP in the last 168 hours.  Radiological Exams on Admission: Ct Abdomen Pelvis W  Contrast  04/05/2014   CLINICAL DATA:  Midline to RIGHT-side abdominal pain at upper and lower abdomen and nausea for 2 weeks, history diabetes and Crohn's disease, prior hysterectomy, appendectomy, small bowel resection  EXAM: CT ABDOMEN AND PELVIS WITH CONTRAST  TECHNIQUE: Multidetector CT imaging of the abdomen and pelvis was performed using the standard protocol following bolus administration of intravenous contrast. Sagittal and coronal MPR images reconstructed from axial data set.  CONTRAST:  133m OMNIPAQUE IOHEXOL 300 MG/ML SOLN IV. Dilute oral contrast.  COMPARISON:  04/13/2012  FINDINGS: Minimal chronic bibasilar atelectasis.  Tiny cyst anteriorly in liver 5 mm diameter image 16 unchanged.  Liver, spleen, pancreas, kidneys, and adrenal glands otherwise normal.  Prior ileocolic resection and anastomosis.  Focal inflammatory process identified in the RIGHT lower quadrant/pelvis, where multiple clustered small bowel loops and intervening and surrounding inflammatory changes are identified most likely related to Crohn's disease.  Several small fluid collections are associated with this inflammatory  site though, due to lack of opacification of the distal small bowel loops, it is uncertain whether these represent unopacified small bowel loops or small abscess collections.  I am suspicious that at least the more anterior collection represents an abscess as this demonstrates slight wall thickening and enhancement, measuring 3.0 x 1.4 x potentially 3.7 cm.  No free intraperitoneal air or fluid identified.  Fluid filled nondistended RIGHT colon.  Remaining bowel loops normal without bowel dilatation or evidence of obstruction.  Small hiatal hernia.  Numerous normal sized mesenteric lymph nodes without gross adenopathy.  Atherosclerotic calcifications aorta.  No acute osseous findings.  IMPRESSION: Cluster of inflamed small bowel loops in the RIGHT mid abdomen/pelvis consistent with Crohn's disease and significant  peri-intestinal inflammation.  Several fluid attenuation collections are associated with this phlegmon, some which certainly represent small bowel loops but am suspicious that there is at least an anterior abscess collection measuring 3.0 x 1.4 x 3.7 cm.  No evidence of bowel obstruction or free intraperitoneal air.   Electronically Signed   By: Lavonia Dana M.D.   On: 04/05/2014 16:29    Assessment/Plan Principal Problem:   Crohn's disease of small intestine with abscess Active Problems:   Obesity, Class II, BMI 35-39.9, with comorbidity   TOBACCO ABUSE   HYPERTENSION, BENIGN ESSENTIAL   Leukocytosis   Intra-abdominal abscess   DM (diabetes mellitus), type 2, uncontrolled   1. Crohn's disease flare associated with intra-abdominal abscess: Admit to medical floor. Place on clear liquid diet. Treat empirically with IV antibiotics-IV Zosyn and Flagyl to cover for anaerobes. GI and surgery have been consulted by EDP. As per GI, start IV Solu-Medrol for Crohn's flare. Monitor closely and advance diet as tolerated. No percutaneous drainage of abscess due to risk of fistula formation. 2. Mild dehydration: Secondary to poor oral intake. Brief IV fluids. 3. Essential hypertension: Controlled. Continue home ARB/HCTZ. 4. Diet controlled DM 2: Monitor CBGs and place on SSI as needed. 5. Tobacco abuse: Cessation counseled. 6. Leukocytosis: Secondary to problem #1. 7. Obesity 8. Hypokalemia: Replace and follow BMP     Code Status: Full  Family Communication: None at bedside  Disposition Plan: Home when medically stable.   Time spent: 60 minutes.  Vernell Leep, MD, FACP, FHM. [facility] Pager [phone]  If 7PM-7AM, please contact night-coverage [email or web address] Password [facility] 04/05/2014, 6:32 PM

## 2014-04-05 NOTE — ED Notes (Signed)
Pt having abdominal pain, constipation. sts for about 2 weeks. sts when she uses the bathroom it feels better but now she cant go.

## 2014-04-05 NOTE — ED Notes (Signed)
Brought pt back to room; pt getting undressed and into a gown at this time; Abigail Butts, RN aware of pt

## 2014-04-05 NOTE — ED Provider Notes (Signed)
CSN: [id number]     Arrival date & time 04/05/14  1136 History   First MD Initiated Contact with Patient 04/05/14 1209     Chief Complaint  Patient presents with  . Abdominal Pain  . Constipation     (Consider location/radiation/quality/duration/timing/severity/associated sxs/prior Treatment) HPI Comments: Patient with past medical history remarkable for hypertension, diabetes, and Crohn's disease, presents to the emergency department with chief complaint of abdominal pain. She states that she has been having persistent abdominal pain for the past 2 weeks. She reports associated nausea and diarrhea. She denies any vomiting. She is followed by gastroenterology, and was recently seen, and diagnosed with constipation. She was given a bowel regimen, which she says helps significantly, but she is still having the abdominal pain. Patient reports having prior abdominal surgeries including appendectomy and small bowel resection. She states that the pain is moderate in severity. There are no aggravating or alleviating factors.  The history is provided by the patient. No language interpreter was used.    Past Medical History  Diagnosis Date  . Allergy   . Depression   . Hypertension   . Crohn disease   . Diabetes mellitus type II    Past Surgical History  Procedure Laterality Date  . Appendectomy    . Abdominal hysterectomy    . Small intestine surgery    . Breast surgery     Family History  Problem Relation Age of Onset  . Hypertension Mother   . Arthritis Father   . Alcohol abuse Paternal Uncle   . Diabetes Paternal Grandmother   . Stroke Paternal Grandmother    History  Substance Use Topics  . Smoking status: Current Every Day Smoker -- 1.00 packs/day for 35 years    Types: Cigarettes  . Smokeless tobacco: Never Used  . Alcohol Use: No   OB History    No data available     Review of Systems  Constitutional: Negative for fever and chills.  Respiratory: Negative for  shortness of breath.   Cardiovascular: Negative for chest pain.  Gastrointestinal: Positive for nausea and abdominal pain. Negative for vomiting, diarrhea and constipation.  Genitourinary: Negative for dysuria.  All other systems reviewed and are negative.     Allergies  Lisinopril; Crestor; Lipitor; and Azathioprine  Home Medications   Prior to Admission medications   Medication Sig Start Date End Date Taking? Authorizing Provider  amoxicillin-clavulanate (AUGMENTIN) 875-125 MG per tablet Take 1 tablet by mouth 2 (two) times daily. 03/11/14   Janith Lima, MD  Azilsartan-Chlorthalidone (EDARBYCLOR) 40-25 MG TABS TAKE 1 TABLET BY MOUTH DAILY. 01/12/14   Hendricks Limes, MD  buPROPion (WELLBUTRIN SR) 100 MG 12 hr tablet Take 100 mg by mouth at bedtime.    Historical Provider, MD  cetirizine (ZYRTEC) 10 MG tablet Take 1 tablet (10 mg total) by mouth daily. 09/14/13   Janith Lima, MD  fluticasone (FLONASE) 50 MCG/ACT nasal spray Place 2 sprays into both nostrils daily as needed for allergies or rhinitis. 09/14/13   Janith Lima, MD  HYDROcodone-acetaminophen (NORCO/VICODIN) 5-325 MG per tablet Take 1 tablet by mouth every 6 (six) hours as needed for moderate pain.    Historical Provider, MD  lamoTRIgine (LAMICTAL) 200 MG tablet Take 200 mg by mouth at bedtime.    Historical Provider, MD  Menthol-Methyl Salicylate (MUSCLE RUB) 10-15 % CREA Apply 1 application topically 2 (two) times daily as needed for muscle pain.    Historical Provider, MD  Umeclidinium Bromide (  INCRUSE ELLIPTA) 62.5 MCG/INH AEPB Inhale 1 puff into the lungs daily. 03/11/14   Janith Lima, MD   BP 95/63 mmHg  Pulse 84  Temp(Src) 98.8 F (37.1 C) (Oral)  Resp 13  SpO2 99%  LMP 05/14/1988 Physical Exam  Constitutional: She is oriented to person, place, and time. She appears well-developed and well-nourished.  HENT:  Head: Normocephalic and atraumatic.  Eyes: Conjunctivae and EOM are normal. Pupils are equal,  round, and reactive to light.  Neck: Normal range of motion. Neck supple.  Cardiovascular: Normal rate and regular rhythm.  Exam reveals no gallop and no friction rub.   No murmur heard. Pulmonary/Chest: Effort normal and breath sounds normal. No respiratory distress. She has no wheezes. She has no rales. She exhibits no tenderness.  Abdominal: Soft. Bowel sounds are normal. She exhibits no distension and no mass. There is tenderness. There is no rebound and no guarding.  Diffuse abdominal discomfort, no focal abdominal tenderness  Musculoskeletal: Normal range of motion. She exhibits no edema or tenderness.  Neurological: She is alert and oriented to person, place, and time.  Skin: Skin is warm and dry.  Psychiatric: She has a normal mood and affect. Her behavior is normal. Judgment and thought content normal.  Nursing note and vitals reviewed.   ED Course  Procedures (including critical care time) Labs Review Labs Reviewed  CBC WITH DIFFERENTIAL - Abnormal; Notable for the following:    WBC 18.2 (*)    Neutro Abs 13.4 (*)    Monocytes Absolute 1.6 (*)    All other components within normal limits  COMPREHENSIVE METABOLIC PANEL - Abnormal; Notable for the following:    Potassium 3.5 (*)    Glucose, Bld 124 (*)    Total Bilirubin 0.2 (*)    GFR calc non Af Amer 77 (*)    GFR calc Af Amer 90 (*)    All other components within normal limits  LIPASE, BLOOD  URINALYSIS, ROUTINE W REFLEX MICROSCOPIC    Imaging Review No results found.   EKG Interpretation None      MDM   Final diagnoses:  Abdominal pain  Abdominal pain    Patient with history of Crohn's, reports increased abdominal pain over the past 2 weeks. Associated nausea and diarrhea. Will check imaging, and labs.  Moderate leukocytosis. Will treat pain, give fluids, reassess.  3:31 PM CT still pending.  Patient signed out to [place], PA-C.    Plan:  Follow-up on CT.      Montine Circle, PA-C 04/05/14  [place], MD 04/07/14 1728

## 2014-04-05 NOTE — ED Provider Notes (Signed)
Patient received in signout from [place] at shift change. Briefly, 58 year old female with issues with constipation and abdominal pain. She was seen by GI, Dr. Benson Norway, started on a bowel regimen which helps significantly, but is still having severe abdominal pain. Lab work with leukocytosis of 18,000.  Plan: CT abdomen and pelvis pending for further evaluation. We'll follow results indisposed accordingly.  Results for orders placed or performed during the hospital encounter of 04/05/14  CBC with Differential  Result Value Ref Range   WBC 18.2 (H) 4.0 - 10.5 K/uL   RBC 4.73 3.87 - 5.11 MIL/uL   Hemoglobin 14.2 12.0 - 15.0 g/dL   HCT 43.2 36.0 - 46.0 %   MCV 91.3 78.0 - 100.0 fL   MCH 30.0 26.0 - 34.0 pg   MCHC 32.9 30.0 - 36.0 g/dL   RDW 13.8 11.5 - 15.5 %   Platelets 339 150 - 400 K/uL   Neutrophils Relative % 73 43 - 77 %   Neutro Abs 13.4 (H) 1.7 - 7.7 K/uL   Lymphocytes Relative 16 12 - 46 %   Lymphs Abs 2.9 0.7 - 4.0 K/uL   Monocytes Relative 9 3 - 12 %   Monocytes Absolute 1.6 (H) 0.1 - 1.0 K/uL   Eosinophils Relative 2 0 - 5 %   Eosinophils Absolute 0.3 0.0 - 0.7 K/uL   Basophils Relative 0 0 - 1 %   Basophils Absolute 0.0 0.0 - 0.1 K/uL  Comprehensive metabolic panel  Result Value Ref Range   Sodium 137 137 - 147 mEq/L   Potassium 3.5 (L) 3.7 - 5.3 mEq/L   Chloride 99 96 - 112 mEq/L   CO2 23 19 - 32 mEq/L   Glucose, Bld 124 (H) 70 - 99 mg/dL   BUN 14 6 - 23 mg/dL   Creatinine, Ser 0.82 0.50 - 1.10 mg/dL   Calcium 9.2 8.4 - 10.5 mg/dL   Total Protein 7.8 6.0 - 8.3 g/dL   Albumin 3.5 3.5 - 5.2 g/dL   AST 14 0 - 37 U/L   ALT 11 0 - 35 U/L   Alkaline Phosphatase 81 39 - 117 U/L   Total Bilirubin 0.2 (L) 0.3 - 1.2 mg/dL   GFR calc non Af Amer 77 (L) >90 mL/min   GFR calc Af Amer 90 (L) >90 mL/min   Anion gap 15 5 - 15  Lipase, blood  Result Value Ref Range   Lipase 35 11 - 59 U/L   Dg Chest 2 View  03/11/2014   CLINICAL DATA:  Cough.  Pneumonia  EXAM: CHEST  2  VIEW  COMPARISON:  03/28/2013  FINDINGS: The heart size and mediastinal contours are within normal limits. Both lungs are clear. The visualized skeletal structures are unremarkable.  IMPRESSION: No active cardiopulmonary disease.   Electronically Signed   By: Franchot Gallo M.D.   On: 03/11/2014 11:20   Ct Abdomen Pelvis W Contrast  04/05/2014   CLINICAL DATA:  Midline to RIGHT-side abdominal pain at upper and lower abdomen and nausea for 2 weeks, history diabetes and Crohn's disease, prior hysterectomy, appendectomy, small bowel resection  EXAM: CT ABDOMEN AND PELVIS WITH CONTRAST  TECHNIQUE: Multidetector CT imaging of the abdomen and pelvis was performed using the standard protocol following bolus administration of intravenous contrast. Sagittal and coronal MPR images reconstructed from axial data set.  CONTRAST:  140mL OMNIPAQUE IOHEXOL 300 MG/ML SOLN IV. Dilute oral contrast.  COMPARISON:  04/13/2012  FINDINGS: Minimal chronic bibasilar atelectasis.  Tiny cyst anteriorly in liver 5 mm diameter image 16 unchanged.  Liver, spleen, pancreas, kidneys, and adrenal glands otherwise normal.  Prior ileocolic resection and anastomosis.  Focal inflammatory process identified in the RIGHT lower quadrant/pelvis, where multiple clustered small bowel loops and intervening and surrounding inflammatory changes are identified most likely related to Crohn's disease.  Several small fluid collections are associated with this inflammatory site though, due to lack of opacification of the distal small bowel loops, it is uncertain whether these represent unopacified small bowel loops or small abscess collections.  I am suspicious that at least the more anterior collection represents an abscess as this demonstrates slight wall thickening and enhancement, measuring 3.0 x 1.4 x potentially 3.7 cm.  No free intraperitoneal air or fluid identified.  Fluid filled nondistended RIGHT colon.  Remaining bowel loops normal without bowel  dilatation or evidence of obstruction.  Small hiatal hernia.  Numerous normal sized mesenteric lymph nodes without gross adenopathy.  Atherosclerotic calcifications aorta.  No acute osseous findings.  IMPRESSION: Cluster of inflamed small bowel loops in the RIGHT mid abdomen/pelvis consistent with Crohn's disease and significant peri-intestinal inflammation.  Several fluid attenuation collections are associated with this phlegmon, some which certainly represent small bowel loops but am suspicious that there is at least an anterior abscess collection measuring 3.0 x 1.4 x 3.7 cm.  No evidence of bowel obstruction or free intraperitoneal air.   Electronically Signed   By: Lavonia Dana M.D.   On: 04/05/2014 16:29    CT with peri-intestinal inflammation as well as questionable anterior abscess. On repeat evaluation, patient states "I just feel like something is not right". She states she is still having some abdominal discomfort, but is improved from earlier today. Will discuss with general surgery.  5:29 PM Case discussed with Dr. Hulen Skains, on call for general surgery, who has reviewed CT-- does feel this is an abscess, would not recommend Perc drain at this time given her Crohn's.  Recommends IV abx for now, monitor closely as she may develop a fistula. Recommends combo of zosyn/flagyl or cipro/flagyl-- will follow along.  Will admit to hospitalist, Dr. Algis Liming-- med surg.  IV abx started, Temp admission orders placed.  Notified Dr. Benson Norway patient will be admitted, he will see her in consult.  Larene Pickett, PA-C 04/05/14 [place], MD 04/12/14 [phone]

## 2014-04-06 ENCOUNTER — Encounter (HOSPITAL_COMMUNITY): Payer: Self-pay

## 2014-04-06 DIAGNOSIS — E876 Hypokalemia: Secondary | ICD-10-CM

## 2014-04-06 DIAGNOSIS — E860 Dehydration: Secondary | ICD-10-CM

## 2014-04-06 LAB — COMPREHENSIVE METABOLIC PANEL
ALT: 8 U/L (ref 0–35)
ANION GAP: 15 (ref 5–15)
AST: 11 U/L (ref 0–37)
Albumin: 3.2 g/dL — ABNORMAL LOW (ref 3.5–5.2)
Alkaline Phosphatase: 78 U/L (ref 39–117)
BUN: 13 mg/dL (ref 6–23)
CALCIUM: 9 mg/dL (ref 8.4–10.5)
CO2: 23 mEq/L (ref 19–32)
Chloride: 100 mEq/L (ref 96–112)
Creatinine, Ser: 0.86 mg/dL (ref 0.50–1.10)
GFR calc Af Amer: 85 mL/min — ABNORMAL LOW (ref >90)
GFR calc non Af Amer: 73 mL/min — ABNORMAL LOW (ref >90)
GLUCOSE: 165 mg/dL — AB (ref 70–99)
Potassium: 3.7 mEq/L (ref 3.7–5.3)
SODIUM: 138 meq/L (ref 137–147)
TOTAL PROTEIN: 7.5 g/dL (ref 6.0–8.3)
Total Bilirubin: 0.4 mg/dL (ref 0.3–1.2)

## 2014-04-06 LAB — GLUCOSE, CAPILLARY
GLUCOSE-CAPILLARY: 131 mg/dL — AB (ref 70–99)
GLUCOSE-CAPILLARY: 137 mg/dL — AB (ref 70–99)
GLUCOSE-CAPILLARY: 120 mg/dL — AB (ref 70–99)
Glucose-Capillary: 143 mg/dL — ABNORMAL HIGH (ref 70–99)

## 2014-04-06 LAB — CBC
HCT: 40.6 % (ref 36.0–46.0)
HEMOGLOBIN: 13.2 g/dL (ref 12.0–15.0)
MCH: 29.7 pg (ref 26.0–34.0)
MCHC: 32.5 g/dL (ref 30.0–36.0)
MCV: 91.2 fL (ref 78.0–100.0)
Platelets: 324 10*3/uL (ref 150–400)
RBC: 4.45 MIL/uL (ref 3.87–5.11)
RDW: 14 % (ref 11.5–15.5)
WBC: 11.7 10*3/uL — ABNORMAL HIGH (ref 4.0–10.5)

## 2014-04-06 MED ORDER — WHITE PETROLATUM GEL
Status: AC
  Administered 2014-04-06
  Filled 2014-04-06: qty 5

## 2014-04-06 MED ORDER — SODIUM CHLORIDE 0.9 % IV BOLUS (SEPSIS)
500.0000 mL | Freq: Once | INTRAVENOUS | Status: AC
  Administered 2014-04-06: 500 mL via INTRAVENOUS

## 2014-04-06 MED ORDER — POTASSIUM CHLORIDE IN NACL 40-0.9 MEQ/L-% IV SOLN
INTRAVENOUS | Status: AC
  Administered 2014-04-06: 75 mL/h via INTRAVENOUS
  Filled 2014-04-06 (×2): qty 1000

## 2014-04-06 MED ORDER — NICOTINE 21 MG/24HR TD PT24
21.0000 mg | MEDICATED_PATCH | Freq: Every day | TRANSDERMAL | Status: DC
  Administered 2014-04-06 – 2014-04-07 (×2): 21 mg via TRANSDERMAL
  Filled 2014-04-06 (×3): qty 1

## 2014-04-06 MED ORDER — IRBESARTAN 150 MG PO TABS
150.0000 mg | ORAL_TABLET | Freq: Every day | ORAL | Status: DC
  Administered 2014-04-07 – 2014-04-08 (×2): 150 mg via ORAL
  Filled 2014-04-06 (×2): qty 1

## 2014-04-06 NOTE — Progress Notes (Signed)
0130 Patient stated that her blood pressure had been low the last time they check it. I noted at 2219 B/P was 86/43. 0135 Manual B/P was 88/50. Chaney Malling NP notified, order received. 8550 NS bolus started. 0319 NS bolus ended and manual B/P 116/64. Will continue to monitor.

## 2014-04-06 NOTE — Plan of Care (Signed)
Problem: Phase I Progression Outcomes Goal: Pain controlled with appropriate interventions Outcome: Completed/Met Date Met:  04/06/14 Goal: OOB as tolerated unless otherwise ordered Outcome: Completed/Met Date Met:  04/06/14 Goal: Voiding-avoid urinary catheter unless indicated Outcome: Completed/Met Date Met:  04/06/14

## 2014-04-06 NOTE — Progress Notes (Signed)
Subjective: No acute events.  Objective: Vital signs in last 24 hours: Temp:  [98.4 F (36.9 C)-99.3 F (37.4 C)] 98.4 F (36.9 C) (11/24 0500) Pulse Rate:  [69-99] 71 (11/24 0500) Resp:  [13-25] 18 (11/24 0500) BP: (85-116)/(43-64) 95/53 mmHg (11/24 0500) SpO2:  [96 %-100 %] 100 % (11/24 0500) Weight:  [96.2 kg (212 lb 1.3 oz)-98.2 kg (216 lb 7.9 oz)] 96.2 kg (212 lb 1.3 oz) (11/24 0500) Last BM Date: 04/04/14  Intake/Output from previous day: 11/23 0701 - 11/24 0700 In: 1867.5 [P.O.:440; I.V.:777.5; IV Piggyback:650] Out: -  Intake/Output this shift: Total I/O In: 1867.5 [P.O.:440; I.V.:777.5; IV Piggyback:650] Out: -   General appearance: alert and no distress GI: soft, non-tender; bowel sounds normal; no masses,  no organomegaly  Lab Results:  Recent Labs  04/05/14 1152 04/06/14 0516  WBC 18.2* 11.7*  HGB 14.2 13.2  HCT 43.2 40.6  PLT 339 324   BMET  Recent Labs  04/05/14 1152 04/06/14 0516  NA 137 138  K 3.5* 3.7  CL 99 100  CO2 23 23  GLUCOSE 124* 165*  BUN 14 13  CREATININE 0.82 0.86  CALCIUM 9.2 9.0   LFT  Recent Labs  04/06/14 0516  PROT 7.5  ALBUMIN 3.2*  AST 11  ALT 8  ALKPHOS 78  BILITOT 0.4   PT/INR No results for input(s): LABPROT, INR in the last 72 hours. Hepatitis Panel No results for input(s): HEPBSAG, HCVAB, HEPAIGM, HEPBIGM in the last 72 hours. C-Diff No results for input(s): CDIFFTOX in the last 72 hours. Fecal Lactopherrin No results for input(s): FECLLACTOFRN in the last 72 hours.  Studies/Results: Ct Abdomen Pelvis W Contrast  04/05/2014   CLINICAL DATA:  Midline to RIGHT-side abdominal pain at upper and lower abdomen and nausea for 2 weeks, history diabetes and Crohn's disease, prior hysterectomy, appendectomy, small bowel resection  EXAM: CT ABDOMEN AND PELVIS WITH CONTRAST  TECHNIQUE: Multidetector CT imaging of the abdomen and pelvis was performed using the standard protocol following bolus administration of  intravenous contrast. Sagittal and coronal MPR images reconstructed from axial data set.  CONTRAST:  146m OMNIPAQUE IOHEXOL 300 MG/ML SOLN IV. Dilute oral contrast.  COMPARISON:  04/13/2012  FINDINGS: Minimal chronic bibasilar atelectasis.  Tiny cyst anteriorly in liver 5 mm diameter image 16 unchanged.  Liver, spleen, pancreas, kidneys, and adrenal glands otherwise normal.  Prior ileocolic resection and anastomosis.  Focal inflammatory process identified in the RIGHT lower quadrant/pelvis, where multiple clustered small bowel loops and intervening and surrounding inflammatory changes are identified most likely related to Crohn's disease.  Several small fluid collections are associated with this inflammatory site though, due to lack of opacification of the distal small bowel loops, it is uncertain whether these represent unopacified small bowel loops or small abscess collections.  I am suspicious that at least the more anterior collection represents an abscess as this demonstrates slight wall thickening and enhancement, measuring 3.0 x 1.4 x potentially 3.7 cm.  No free intraperitoneal air or fluid identified.  Fluid filled nondistended RIGHT colon.  Remaining bowel loops normal without bowel dilatation or evidence of obstruction.  Small hiatal hernia.  Numerous normal sized mesenteric lymph nodes without gross adenopathy.  Atherosclerotic calcifications aorta.  No acute osseous findings.  IMPRESSION: Cluster of inflamed small bowel loops in the RIGHT mid abdomen/pelvis consistent with Crohn's disease and significant peri-intestinal inflammation.  Several fluid attenuation collections are associated with this phlegmon, some which certainly represent small bowel loops but am suspicious that  there is at least an anterior abscess collection measuring 3.0 x 1.4 x 3.7 cm.  No evidence of bowel obstruction or free intraperitoneal air.   Electronically Signed   By: Lavonia Dana M.D.   On: 04/05/2014 16:29     Medications:  Scheduled: . buPROPion  100 mg Oral QHS  . chlorthalidone  25 mg Oral Daily  . enoxaparin (LOVENOX) injection  40 mg Subcutaneous Q24H  . irbesartan  300 mg Oral Daily  . lamoTRIgine  200 mg Oral QHS  . lubiprostone  24 mcg Oral QHS  . methylPREDNISolone (SOLU-MEDROL) injection  40 mg Intravenous Q12H  . metronidazole  500 mg Intravenous Q8H  . piperacillin-tazobactam (ZOSYN)  IV  3.375 g Intravenous Q8H   Continuous: . sodium chloride 75 mL/hr at 04/05/14 1838    Assessment/Plan: 1) Crohn's flare. 2) RLQ abscess.   She is feeling better today.  No change in therapy at this time.  Plan: 1) Continue with antibiotics. 2) Continue with solumedrol.   LOS: 1 day   Rodel Glaspy D 04/06/2014, 6:39 AM

## 2014-04-06 NOTE — Progress Notes (Addendum)
PROGRESS NOTE    April [name]:[id number] DOB: 07-20-1955 DOA: 04/05/2014 PCP: Scarlette Calico, MD  HPI/Brief narrative 58 y.o. female with history of Crohn's disease said to be in remission for the last few years, hypertension, DM 2, tobacco abuse, pancreatitis due to Azathioprine, presented to the ED with abdominal pain. She was admitted with Crohn's flare and RLQ abscess. GI consulted.   Assessment/Plan:   1. Crohn's disease flare associated with intra-abdominal abscess: Continue clear liquid diet. Treating empirically with IV antibiotics-IV Zosyn and Flagyl to cover for anaerobes. Continue IV Solu-Medrol. GI consultation appreciated. No percutaneous drainage of abscess due to risk of fistula formation. Will need interim repeat CT sometime later to reassess for resolution of abscess. Improving. 2. Mild dehydration: Secondary to poor oral intake. Brief IV fluids. 3. Essential hypertension: Soft blood pressures. Hold diuretics and reduce ARB dose temporarily (received this am dose). Had episode of hypotension early this morning. Continue brief IV fluids. 4. Diet controlled DM 2: Monitor CBGs and place on SSI as needed. Good inpatient control. 5. Tobacco abuse: Cessation counseled. 6. Leukocytosis: Secondary to problem #1. Improving. 7. Obesity 8. Hypokalemia: Replaced   Code Status: Full Family Communication: None at bedside Disposition Plan: Home when medically stable   Consultants:  Gastroenterology  Procedures:  None  Antibiotics:  IV Zosyn  IV Flagyl   Subjective: Feels better with decreasing abdominal pain. No BM. Nausea but no vomiting. Decreased appetite. Asking for solid food.  Objective: Filed Vitals:   04/06/14 0135 04/06/14 0319 04/06/14 0500 04/06/14 1434  BP: 88/50 116/64 95/53 101/54  Pulse:   71 88  Temp:   98.4 F (36.9 C) 98.6 F (37 C)  TempSrc:   Oral Oral  Resp:   18 18  Height:      Weight:   96.2 kg (212 lb 1.3 oz)   SpO2:    100% 99%    Intake/Output Summary (Last 24 hours) at 04/06/14 1655 Last data filed at 04/06/14 1300  Gross per 24 hour  Intake 2347.5 ml  Output      0 ml  Net 2347.5 ml   Filed Weights   04/05/14 1818 04/06/14 0500  Weight: 98.2 kg (216 lb 7.9 oz) 96.2 kg (212 lb 1.3 oz)     Exam:  General exam: Pleasant young female lying comfortably in bed. Respiratory system: Clear. No increased work of breathing. Cardiovascular system: S1 & S2 heard, RRR. No JVD, murmurs, gallops, clicks or pedal edema. Gastrointestinal system: Abdomen is nondistended, soft and mildly tender in the right mid and lower quadrants without peritoneal signs. Normal bowel sounds heard. Central nervous system: Alert and oriented. No focal neurological deficits. Extremities: Symmetric 5 x 5 power.   Data Reviewed: Basic Metabolic Panel:  Recent Labs Lab 04/05/14 1152 04/06/14 0516  NA 137 138  K 3.5* 3.7  CL 99 100  CO2 23 23  GLUCOSE 124* 165*  BUN 14 13  CREATININE 0.82 0.86  CALCIUM 9.2 9.0   Liver Function Tests:  Recent Labs Lab 04/05/14 1152 04/06/14 0516  AST 14 11  ALT 11 8  ALKPHOS 81 78  BILITOT 0.2* 0.4  PROT 7.8 7.5  ALBUMIN 3.5 3.2*    Recent Labs Lab 04/05/14 1152  LIPASE 35   No results for input(s): AMMONIA in the last 168 hours. CBC:  Recent Labs Lab 04/05/14 1152 04/06/14 0516  WBC 18.2* 11.7*  NEUTROABS 13.4*  --   HGB 14.2 13.2  HCT 43.2 40.6  MCV 91.3 91.2  PLT 339 324   Cardiac Enzymes: No results for input(s): CKTOTAL, CKMB, CKMBINDEX, TROPONINI in the last 168 hours. BNP (last 3 results) No results for input(s): PROBNP in the last 8760 hours. CBG:  Recent Labs Lab 04/05/14 2217 04/06/14 0744 04/06/14 1247  GLUCAP 129* 131* 137*    No results found for this or any previous visit (from the past 240 hour(s)).         Studies: Ct Abdomen Pelvis W Contrast  04/05/2014   CLINICAL DATA:  Midline to RIGHT-side abdominal pain at upper and  lower abdomen and nausea for 2 weeks, history diabetes and Crohn's disease, prior hysterectomy, appendectomy, small bowel resection  EXAM: CT ABDOMEN AND PELVIS WITH CONTRAST  TECHNIQUE: Multidetector CT imaging of the abdomen and pelvis was performed using the standard protocol following bolus administration of intravenous contrast. Sagittal and coronal MPR images reconstructed from axial data set.  CONTRAST:  150m OMNIPAQUE IOHEXOL 300 MG/ML SOLN IV. Dilute oral contrast.  COMPARISON:  04/13/2012  FINDINGS: Minimal chronic bibasilar atelectasis.  Tiny cyst anteriorly in liver 5 mm diameter image 16 unchanged.  Liver, spleen, pancreas, kidneys, and adrenal glands otherwise normal.  Prior ileocolic resection and anastomosis.  Focal inflammatory process identified in the RIGHT lower quadrant/pelvis, where multiple clustered small bowel loops and intervening and surrounding inflammatory changes are identified most likely related to Crohn's disease.  Several small fluid collections are associated with this inflammatory site though, due to lack of opacification of the distal small bowel loops, it is uncertain whether these represent unopacified small bowel loops or small abscess collections.  I am suspicious that at least the more anterior collection represents an abscess as this demonstrates slight wall thickening and enhancement, measuring 3.0 x 1.4 x potentially 3.7 cm.  No free intraperitoneal air or fluid identified.  Fluid filled nondistended RIGHT colon.  Remaining bowel loops normal without bowel dilatation or evidence of obstruction.  Small hiatal hernia.  Numerous normal sized mesenteric lymph nodes without gross adenopathy.  Atherosclerotic calcifications aorta.  No acute osseous findings.  IMPRESSION: Cluster of inflamed small bowel loops in the RIGHT mid abdomen/pelvis consistent with Crohn's disease and significant peri-intestinal inflammation.  Several fluid attenuation collections are associated with  this phlegmon, some which certainly represent small bowel loops but am suspicious that there is at least an anterior abscess collection measuring 3.0 x 1.4 x 3.7 cm.  No evidence of bowel obstruction or free intraperitoneal air.   Electronically Signed   By: MLavonia DanaM.D.   On: 04/05/2014 16:29        Scheduled Meds: . buPROPion  100 mg Oral QHS  . chlorthalidone  25 mg Oral Daily  . enoxaparin (LOVENOX) injection  40 mg Subcutaneous Q24H  . irbesartan  300 mg Oral Daily  . lamoTRIgine  200 mg Oral QHS  . lubiprostone  24 mcg Oral QHS  . methylPREDNISolone (SOLU-MEDROL) injection  40 mg Intravenous Q12H  . metronidazole  500 mg Intravenous Q8H  . piperacillin-tazobactam (ZOSYN)  IV  3.375 g Intravenous Q8H   Continuous Infusions:   Principal Problem:   Crohn's disease of small intestine with abscess Active Problems:   Obesity, Class II, BMI 35-39.9, with comorbidity   TOBACCO ABUSE   HYPERTENSION, BENIGN ESSENTIAL   Leukocytosis   Hypokalemia   Intra-abdominal abscess   DM (diabetes mellitus), type 2, uncontrolled   Dehydration    Time spent: 30 minutes.    HVernell Leep MD, FACP, FHM. [facility] Pager [phone]  If 7PM-7AM, please contact night-coverage [email or web address] Password [email or web address] 04/06/2014, 4:55 PM    LOS: 1 day

## 2014-04-07 DIAGNOSIS — K651 Peritoneal abscess: Secondary | ICD-10-CM

## 2014-04-07 DIAGNOSIS — E1165 Type 2 diabetes mellitus with hyperglycemia: Secondary | ICD-10-CM

## 2014-04-07 LAB — BASIC METABOLIC PANEL
ANION GAP: 13 (ref 5–15)
BUN: 16 mg/dL (ref 6–23)
CALCIUM: 9.2 mg/dL (ref 8.4–10.5)
CO2: 23 mEq/L (ref 19–32)
Chloride: 102 mEq/L (ref 96–112)
Creatinine, Ser: 0.79 mg/dL (ref 0.50–1.10)
GFR calc Af Amer: >90 mL/min (ref >90)
GLUCOSE: 131 mg/dL — AB (ref 70–99)
Potassium: 3.9 mEq/L (ref 3.7–5.3)
SODIUM: 138 meq/L (ref 137–147)

## 2014-04-07 LAB — GLUCOSE, CAPILLARY
GLUCOSE-CAPILLARY: 118 mg/dL — AB (ref 70–99)
GLUCOSE-CAPILLARY: 118 mg/dL — AB (ref 70–99)
Glucose-Capillary: 123 mg/dL — ABNORMAL HIGH (ref 70–99)
Glucose-Capillary: 176 mg/dL — ABNORMAL HIGH (ref 70–99)

## 2014-04-07 LAB — CBC
HCT: 39 % (ref 36.0–46.0)
Hemoglobin: 12.6 g/dL (ref 12.0–15.0)
MCH: 29.4 pg (ref 26.0–34.0)
MCHC: 32.3 g/dL (ref 30.0–36.0)
MCV: 90.9 fL (ref 78.0–100.0)
PLATELETS: 375 10*3/uL (ref 150–400)
RBC: 4.29 MIL/uL (ref 3.87–5.11)
RDW: 13.7 % (ref 11.5–15.5)
WBC: 13 10*3/uL — ABNORMAL HIGH (ref 4.0–10.5)

## 2014-04-07 MED ORDER — PREDNISONE 20 MG PO TABS
40.0000 mg | ORAL_TABLET | Freq: Every day | ORAL | Status: DC
  Administered 2014-04-07 – 2014-04-08 (×2): 40 mg via ORAL
  Filled 2014-04-07 (×2): qty 2

## 2014-04-07 MED ORDER — TRAZODONE HCL 50 MG PO TABS
25.0000 mg | ORAL_TABLET | Freq: Every evening | ORAL | Status: DC | PRN
  Administered 2014-04-07: 25 mg via ORAL
  Filled 2014-04-07: qty 1

## 2014-04-07 MED ORDER — METRONIDAZOLE 500 MG PO TABS
500.0000 mg | ORAL_TABLET | Freq: Three times a day (TID) | ORAL | Status: DC
  Administered 2014-04-07 – 2014-04-08 (×3): 500 mg via ORAL
  Filled 2014-04-07 (×5): qty 1

## 2014-04-07 MED ORDER — CIPROFLOXACIN HCL 500 MG PO TABS
500.0000 mg | ORAL_TABLET | Freq: Two times a day (BID) | ORAL | Status: DC
  Administered 2014-04-07 – 2014-04-08 (×2): 500 mg via ORAL
  Filled 2014-04-07 (×3): qty 1

## 2014-04-07 NOTE — Progress Notes (Signed)
PROGRESS NOTE    April May JQD:[id number] DOB: 1956-03-14 DOA: 04/05/2014 PCP: Scarlette Calico, MD   Subjective: Denies any nausea, vomiting or abdominal pain. Asking when she can go home.  HPI/Brief narrative 58 y.o. female with history of Crohn's disease said to be in remission for the last few years, hypertension, DM 2, tobacco abuse, pancreatitis due to Azathioprine, presented to the ED with abdominal pain. She was admitted with Crohn's flare and RLQ abscess. GI consulted.  Assessment/Plan:   1. Crohn's disease flare associated with intra-abdominal abscess: Continue clear liquid diet. Treating empirically with IV antibiotics-IV Zosyn and Flagyl to cover for anaerobes. Continue IV Solu-Medrol. GI consultation appreciated. No percutaneous drainage of abscess due to risk of fistula formation. Seen earlier today by GI and recommended to switch to oral steroids and oral antibiotics. Patient started on regular diet today. Anticipate discharge in a.m. if no problems. 2. Mild dehydration: Secondary to poor oral intake. Brief IV fluids. 3. Essential hypertension: Soft blood pressures. Hold diuretics and reduce ARB dose temporarily (received this am dose). Had episode of hypotension early this morning. Continue brief IV fluids. 4. Diet controlled DM 2: Monitor CBGs and place on SSI as needed. Good inpatient control. 5. Tobacco abuse: Cessation counseled. 6. Leukocytosis: Secondary to problem #1. Improving. 7. Obesity 8. Hypokalemia: Replaced   Code Status: Full Family Communication: None at bedside Disposition Plan: Home when medically stable   Consultants:  Gastroenterology  Procedures:  None  Antibiotics:  IV Zosyn  IV Flagyl     Objective: Filed Vitals:   04/06/14 0500 04/06/14 1434 04/06/14 2142 04/07/14 0622  BP: 95/53 101/54 108/63 116/92  Pulse: 71 88 65 55  Temp: 98.4 F (36.9 C) 98.6 F (37 C) 98.1 F (36.7 C) 97.8 F (36.6 C)  TempSrc: Oral  Oral Oral Oral  Resp: 18 18 16 17   Height:      Weight: 96.2 kg (212 lb 1.3 oz)     SpO2: 100% 99% 97% 99%   No intake or output data in the 24 hours ending 04/07/14 1345 Filed Weights   04/05/14 1818 04/06/14 0500  Weight: 98.2 kg (216 lb 7.9 oz) 96.2 kg (212 lb 1.3 oz)     Exam:  General exam: Pleasant young female lying comfortably in bed. Respiratory system: Clear. No increased work of breathing. Cardiovascular system: S1 & S2 heard, RRR. No JVD, murmurs, gallops, clicks or pedal edema. Gastrointestinal system: Abdomen is nondistended, soft and mildly tender in the right mid and lower quadrants without peritoneal signs. Normal bowel sounds heard. Central nervous system: Alert and oriented. No focal neurological deficits. Extremities: Symmetric 5 x 5 power.   Data Reviewed: Basic Metabolic Panel:  Recent Labs Lab 04/05/14 1152 04/06/14 0516 04/07/14 0533  NA 137 138 138  K 3.5* 3.7 3.9  CL 99 100 102  CO2 23 23 23   GLUCOSE 124* 165* 131*  BUN 14 13 16   CREATININE 0.82 0.86 0.79  CALCIUM 9.2 9.0 9.2   Liver Function Tests:  Recent Labs Lab 04/05/14 1152 04/06/14 0516  AST 14 11  ALT 11 8  ALKPHOS 81 78  BILITOT 0.2* 0.4  PROT 7.8 7.5  ALBUMIN 3.5 3.2*    Recent Labs Lab 04/05/14 1152  LIPASE 35   No results for input(s): AMMONIA in the last 168 hours. CBC:  Recent Labs Lab 04/05/14 1152 04/06/14 0516 04/07/14 0533  WBC 18.2* 11.7* 13.0*  NEUTROABS 13.4*  --   --   HGB  14.2 13.2 12.6  HCT 43.2 40.6 39.0  MCV 91.3 91.2 90.9  PLT 339 324 375   Cardiac Enzymes: No results for input(s): CKTOTAL, CKMB, CKMBINDEX, TROPONINI in the last 168 hours. BNP (last 3 results) No results for input(s): PROBNP in the last 8760 hours. CBG:  Recent Labs Lab 04/06/14 1247 04/06/14 1703 04/06/14 2224 04/07/14 0734 04/07/14 1214  GLUCAP 137* 143* 120* 123* 176*    No results found for this or any previous visit (from the past 240 hour(s)).          Studies: Ct Abdomen Pelvis W Contrast  04/05/2014   CLINICAL DATA:  Midline to RIGHT-side abdominal pain at upper and lower abdomen and nausea for 2 weeks, history diabetes and Crohn's disease, prior hysterectomy, appendectomy, small bowel resection  EXAM: CT ABDOMEN AND PELVIS WITH CONTRAST  TECHNIQUE: Multidetector CT imaging of the abdomen and pelvis was performed using the standard protocol following bolus administration of intravenous contrast. Sagittal and coronal MPR images reconstructed from axial data set.  CONTRAST:  161m OMNIPAQUE IOHEXOL 300 MG/ML SOLN IV. Dilute oral contrast.  COMPARISON:  04/13/2012  FINDINGS: Minimal chronic bibasilar atelectasis.  Tiny cyst anteriorly in liver 5 mm diameter image 16 unchanged.  Liver, spleen, pancreas, kidneys, and adrenal glands otherwise normal.  Prior ileocolic resection and anastomosis.  Focal inflammatory process identified in the RIGHT lower quadrant/pelvis, where multiple clustered small bowel loops and intervening and surrounding inflammatory changes are identified most likely related to Crohn's disease.  Several small fluid collections are associated with this inflammatory site though, due to lack of opacification of the distal small bowel loops, it is uncertain whether these represent unopacified small bowel loops or small abscess collections.  I am suspicious that at least the more anterior collection represents an abscess as this demonstrates slight wall thickening and enhancement, measuring 3.0 x 1.4 x potentially 3.7 cm.  No free intraperitoneal air or fluid identified.  Fluid filled nondistended RIGHT colon.  Remaining bowel loops normal without bowel dilatation or evidence of obstruction.  Small hiatal hernia.  Numerous normal sized mesenteric lymph nodes without gross adenopathy.  Atherosclerotic calcifications aorta.  No acute osseous findings.  IMPRESSION: Cluster of inflamed small bowel loops in the RIGHT mid abdomen/pelvis  consistent with Crohn's disease and significant peri-intestinal inflammation.  Several fluid attenuation collections are associated with this phlegmon, some which certainly represent small bowel loops but am suspicious that there is at least an anterior abscess collection measuring 3.0 x 1.4 x 3.7 cm.  No evidence of bowel obstruction or free intraperitoneal air.   Electronically Signed   By: MLavonia DanaM.D.   On: 04/05/2014 16:29        Scheduled Meds: . buPROPion  100 mg Oral QHS  . enoxaparin (LOVENOX) injection  40 mg Subcutaneous Q24H  . irbesartan  150 mg Oral Daily  . lamoTRIgine  200 mg Oral QHS  . metronidazole  500 mg Intravenous Q8H  . nicotine  21 mg Transdermal Daily  . piperacillin-tazobactam (ZOSYN)  IV  3.375 g Intravenous Q8H  . predniSONE  40 mg Oral Q breakfast   Continuous Infusions:   Principal Problem:   Crohn's disease of small intestine with abscess Active Problems:   Obesity, Class II, BMI 35-39.9, with comorbidity   TOBACCO ABUSE   HYPERTENSION, BENIGN ESSENTIAL   Leukocytosis   Hypokalemia   Intra-abdominal abscess   DM (diabetes mellitus), type 2, uncontrolled   Dehydration    Time spent: 30 minutes.  Birdie Hopes, MD, FACP, FHM. [facility] Pager [phone]  If 7PM-7AM, please contact night-coverage [email or web address] Password [email or web address] 04/07/2014, 1:45 PM    LOS: 2 days

## 2014-04-07 NOTE — Progress Notes (Signed)
ANTIBIOTIC CONSULT NOTE - FOLLOW UP  Pharmacy Consult for Zosyn Indication: Intra-abdominal infection  Allergies  Allergen Reactions  . Lisinopril     cough  . Crestor [Rosuvastatin Calcium]     Muscle and joint aches  . Lipitor [Atorvastatin Calcium]     Muscle cramps  . Azathioprine Other (See Comments)    Side effects to pancrease       Patient Measurements: Height: 5\' 2"  (157.5 cm) Weight: 212 lb 1.3 oz (96.2 kg) IBW/kg (Calculated) : 50.1  Vital Signs: Temp: 97.8 F (36.6 C) (11/25 0622) Temp Source: Oral (11/25 0622) BP: 116/92 mmHg (11/25 0622) Pulse Rate: 55 (11/25 0622) Intake/Output from previous day: 11/24 0701 - 11/25 0700 In: 480 [P.O.:480] Out: -  Intake/Output from this shift:    Labs:  Recent Labs  04/05/14 1152 04/06/14 0516 04/07/14 0533  WBC 18.2* 11.7* 13.0*  HGB 14.2 13.2 12.6  PLT 339 324 375  CREATININE 0.82 0.86 0.79   Estimated Creatinine Clearance: 82.9 mL/min (by C-G formula based on Cr of 0.79). No results for input(s): VANCOTROUGH, VANCOPEAK, VANCORANDOM, GENTTROUGH, GENTPEAK, GENTRANDOM, TOBRATROUGH, TOBRAPEAK, TOBRARND, AMIKACINPEAK, AMIKACINTROU, AMIKACIN in the last 72 hours.   Microbiology: No results found for this or any previous visit (from the past 720 hour(s)).  Anti-infectives    Start     Dose/Rate Route Frequency Ordered Stop   04/06/14 0200  metroNIDAZOLE (FLAGYL) IVPB 500 mg     500 mg100 mL/hr over 60 Minutes Intravenous Every 8 hours 04/05/14 1835     04/06/14 0000  piperacillin-tazobactam (ZOSYN) IVPB 3.375 g     3.375 g12.5 mL/hr over 240 Minutes Intravenous Every 8 hours 04/05/14 1737     04/05/14 1800  piperacillin-tazobactam (ZOSYN) IVPB 3.375 g     3.375 g100 mL/hr over 30 Minutes Intravenous  Once 04/05/14 1737 04/05/14 1830   04/05/14 1745  metroNIDAZOLE (FLAGYL) IVPB 500 mg     500 mg100 mL/hr over 60 Minutes Intravenous  Once 04/05/14 1730 04/05/14 1856       Assessment: Pt is a 58 yo female on  Zosyn for intra-abdominal infection. No cultures drawn. Currently afebrile. WBC slightly elevated.  Renal function has been stable.  Zosyn 11/23 >> Flagyl 11/23 >>?? Augmentin PTA   Goal of Therapy: Clearance of infection /  Infection prevention   Plan: - Zosyn 3.375gm IV Q8H, 4 hr infusion - F/U d/c Flagyl-not addressed - F/U with order to change abx to po - Pharmacy will sign off as renal function has been stable  Alita Chyle, PharmD Candidate 04/07/2014,1:10 PM   Remigio Eisenmenger D. Mina Marble, PharmD, BCPS Pager:  [phone] 04/07/2014, 1:37 PM

## 2014-04-07 NOTE — Progress Notes (Signed)
Subjective: Feeling great, but sleep is hard for her.  The steroids are making it difficult for her to sleep.  Objective: Vital signs in last 24 hours: Temp:  [97.8 F (36.6 C)-98.6 F (37 C)] 97.8 F (36.6 C) (11/25 0622) Pulse Rate:  [55-88] 55 (11/25 0622) Resp:  [16-18] 17 (11/25 0622) BP: (101-116)/(54-92) 116/92 mmHg (11/25 0622) SpO2:  [97 %-99 %] 99 % (11/25 0622) Last BM Date: 04/06/14  Intake/Output from previous day: 11/24 0701 - 11/25 0700 In: 480 [P.O.:480] Out: -  Intake/Output this shift:    General appearance: alert and no distress GI: soft, non-tender; bowel sounds normal; no masses,  no organomegaly  Lab Results:  Recent Labs  04/05/14 1152 04/06/14 0516 04/07/14 0533  WBC 18.2* 11.7* 13.0*  HGB 14.2 13.2 12.6  HCT 43.2 40.6 39.0  PLT 339 324 375   BMET  Recent Labs  04/05/14 1152 04/06/14 0516 04/07/14 0533  NA 137 138 138  K 3.5* 3.7 3.9  CL 99 100 102  CO2 23 23 23   GLUCOSE 124* 165* 131*  BUN 14 13 16   CREATININE 0.82 0.86 0.79  CALCIUM 9.2 9.0 9.2   LFT  Recent Labs  04/06/14 0516  PROT 7.5  ALBUMIN 3.2*  AST 11  ALT 8  ALKPHOS 78  BILITOT 0.4   PT/INR No results for input(s): LABPROT, INR in the last 72 hours. Hepatitis Panel No results for input(s): HEPBSAG, HCVAB, HEPAIGM, HEPBIGM in the last 72 hours. C-Diff No results for input(s): CDIFFTOX in the last 72 hours. Fecal Lactopherrin No results for input(s): FECLLACTOFRN in the last 72 hours.  Studies/Results: Ct Abdomen Pelvis W Contrast  04/05/2014   CLINICAL DATA:  Midline to RIGHT-side abdominal pain at upper and lower abdomen and nausea for 2 weeks, history diabetes and Crohn's disease, prior hysterectomy, appendectomy, small bowel resection  EXAM: CT ABDOMEN AND PELVIS WITH CONTRAST  TECHNIQUE: Multidetector CT imaging of the abdomen and pelvis was performed using the standard protocol following bolus administration of intravenous contrast. Sagittal and  coronal MPR images reconstructed from axial data set.  CONTRAST:  199m OMNIPAQUE IOHEXOL 300 MG/ML SOLN IV. Dilute oral contrast.  COMPARISON:  04/13/2012  FINDINGS: Minimal chronic bibasilar atelectasis.  Tiny cyst anteriorly in liver 5 mm diameter image 16 unchanged.  Liver, spleen, pancreas, kidneys, and adrenal glands otherwise normal.  Prior ileocolic resection and anastomosis.  Focal inflammatory process identified in the RIGHT lower quadrant/pelvis, where multiple clustered small bowel loops and intervening and surrounding inflammatory changes are identified most likely related to Crohn's disease.  Several small fluid collections are associated with this inflammatory site though, due to lack of opacification of the distal small bowel loops, it is uncertain whether these represent unopacified small bowel loops or small abscess collections.  I am suspicious that at least the more anterior collection represents an abscess as this demonstrates slight wall thickening and enhancement, measuring 3.0 x 1.4 x potentially 3.7 cm.  No free intraperitoneal air or fluid identified.  Fluid filled nondistended RIGHT colon.  Remaining bowel loops normal without bowel dilatation or evidence of obstruction.  Small hiatal hernia.  Numerous normal sized mesenteric lymph nodes without gross adenopathy.  Atherosclerotic calcifications aorta.  No acute osseous findings.  IMPRESSION: Cluster of inflamed small bowel loops in the RIGHT mid abdomen/pelvis consistent with Crohn's disease and significant peri-intestinal inflammation.  Several fluid attenuation collections are associated with this phlegmon, some which certainly represent small bowel loops but am suspicious that there  is at least an anterior abscess collection measuring 3.0 x 1.4 x 3.7 cm.  No evidence of bowel obstruction or free intraperitoneal air.   Electronically Signed   By: April May M.D.   On: 04/05/2014 16:29    Medications:  Scheduled: . buPROPion  100 mg  Oral QHS  . enoxaparin (LOVENOX) injection  40 mg Subcutaneous Q24H  . irbesartan  150 mg Oral Daily  . lamoTRIgine  200 mg Oral QHS  . methylPREDNISolone (SOLU-MEDROL) injection  40 mg Intravenous Q12H  . metronidazole  500 mg Intravenous Q8H  . nicotine  21 mg Transdermal Daily  . piperacillin-tazobactam (ZOSYN)  IV  3.375 g Intravenous Q8H   Continuous:   Assessment/Plan: 1) Crohn's flare. 2) Intraabdominal abscess.   She continues to make excellent improvement.  I think she is nearing discharge.  I will change her over to oral steroids and advance her diet.  As for her antibiotics she can be changed over to oral coverage, but I will leave the selection to the Hospitalist.  I left a message with Dr. Algis Liming.  Plan: 1) Prednisone 40 mg QD. 2) Regular diet. 3) Ativan QHS for sleep.  4) Oral antibiotics per Hospitalist.  LOS: 2 days   Marvens Hollars D 04/07/2014, 9:02 AM

## 2014-04-07 NOTE — Care Management Note (Signed)
  Page 1 of 1   04/07/2014     9:43:05 AM CARE MANAGEMENT NOTE 04/07/2014  Patient:  April May, April May   Account Number:  [id number]  Date Initiated:  04/07/2014  Documentation initiated by:  Magdalen Spatz  Subjective/Objective Assessment:     Action/Plan:   Anticipated DC Date:  04/09/2014   Anticipated DC Plan:  HOME/SELF CARE         Choice offered to / List presented to:             Status of service:   Medicare Important Message given?  YES (If response is "NO", the following Medicare IM given date fields will be blank) Date Medicare IM given:  04/07/2014 Medicare IM given by:  Magdalen Spatz Date Additional Medicare IM given:   Additional Medicare IM given by:    Discharge Disposition:    Per UR Regulation:  Reviewed for med. necessity/level of care/duration of stay  If discussed at [place] of Stay Meetings, dates discussed:    Comments:

## 2014-04-07 NOTE — Plan of Care (Signed)
Problem: Phase II Progression Outcomes Goal: Discharge plan established Outcome: Progressing  Problem: Phase III Progression Outcomes Goal: Pain controlled on oral analgesia Outcome: Completed/Met Date Met:  04/07/14 Goal: Voiding independently Outcome: Completed/Met Date Met:  04/07/14 Goal: IV/normal saline lock discontinued Outcome: Completed/Met Date Met:  04/07/14

## 2014-04-08 LAB — GLUCOSE, CAPILLARY: Glucose-Capillary: 100 mg/dL — ABNORMAL HIGH (ref 70–99)

## 2014-04-08 MED ORDER — METRONIDAZOLE 500 MG PO TABS: 500.0000 mg | ORAL_TABLET | Freq: Three times a day (TID) | ORAL | Status: DC

## 2014-04-08 MED ORDER — PREDNISONE 20 MG PO TABS: 40.0000 mg | ORAL_TABLET | Freq: Every day | ORAL | Status: DC

## 2014-04-08 MED ORDER — CIPROFLOXACIN HCL 500 MG PO TABS: 500.0000 mg | ORAL_TABLET | Freq: Two times a day (BID) | ORAL | Status: DC

## 2014-04-08 NOTE — Progress Notes (Signed)
D/c to home via family and wheelchair.  D/c instructions given with prescriptions and patient demonstrated understanding.  VSS. Pain denied. Breathing regular and unlabored

## 2014-04-08 NOTE — Discharge Summary (Signed)
Physician Discharge Summary  April May HUT:[id number] DOB: 1955/06/13 DOA: 04/05/2014  PCP: Scarlette Calico, MD  Admit date: 04/05/2014 Discharge date: 04/08/2014  Time spent: 40 minutes  Recommendations for Outpatient Follow-up:  1. Follow-up with Dr. Benson Norway early next week. 2. Discharged on  prednisone and Cipro/Flagyl for 10 more days, recommend repeat CT scan in 1-2 weeks.  Discharge Diagnoses:  Principal Problem:   Crohn's disease of small intestine with abscess Active Problems:   Obesity, Class II, BMI 35-39.9, with comorbidity   TOBACCO ABUSE   HYPERTENSION, BENIGN ESSENTIAL   Leukocytosis   Hypokalemia   Intra-abdominal abscess   DM (diabetes mellitus), type 2, uncontrolled   Dehydration   Discharge Condition: stable   Diet recommendation: Heart healthy   Filed Weights   04/05/14 1818 04/06/14 0500  Weight: 98.2 kg (216 lb 7.9 oz) 96.2 kg (212 lb 1.3 oz)    History of present illness:  April May is a 58 y.o. female with history of Crohn's disease said to be in remission for the last few years, hypertension, DM 2, tobacco abuse, pancreatitis due to Azathioprine, presented to the ED with abdominal pain. She states that she has not been on medications for Crohn's disease which has been relatively stable until 3 weeks ago when she started experiencing mostly right-sided intermittent abdominal pain. The pain was described as cramping crescendo-decrescendo in nature, radiating to the back, worse on standing or lying down, worse with oral intake, associated with poor oral intake, no relieving factors, not associated with fevers but associated with chills which she attributes to hot flashes as well. She saw her gastroenterologist 3 weeks ago for constipation and was given samples of Amitiza which she took and has been having some BMs but no significant relief in her abdominal pain. Thereby she presented to the ED where WBC 18.2 and CT abdomen shows flareup of  Crohn's disease along with an abscess. ED staff has discussed CT findings with the surgeon on call who recommends IV antibiotics and no percutaneous drainage secondary to risk of fistula formation. General surgery and GI have been consulted by EDP. Hospitalist admission requested.  Hospital Course:   Crohn's disease flare associated with intra-abdominal abscess:  Patient treated conservatively with antibiotics, bowel rest and control pain.  Treated initially with Zosyn and Flagyl to cover for anaerobes. IV steroids also used  GI consultation appreciated. No percutaneous drainage of abscess due to risk of fistula formation.  advanced to regular diet since yesterday, patient tolerated that very well. GI recommended prednisone 40 mg and oral antibiotics on discharge. Patient probably needs repeat the CT scan in 1-2 weeks.  Mild dehydration: Secondary to poor oral intake. Brief IV fluids.  Essential hypertension: Soft blood pressures. Hold diuretics and reduce ARB dose temporarily (received this am dose). Had episode of hypotension early this morning. Continue brief IV fluids.  Diet controlled DM 2: Monitor CBGs and place on SSI as needed. Good inpatient control.  Tobacco abuse: Cessation counseled.  Leukocytosis: Secondary to problem #1. Improving.  Obesity  Hypokalemia: Replete with oral supplements.  Procedures  None  Consultations:  GI  Discharge Exam: Filed Vitals:   04/08/14 0553  BP: 98/52  Pulse: 59  Temp: 99 F (37.2 C)  Resp: 17   General: Alert and awake, oriented x3, not in any acute distress. HEENT: anicteric sclera, pupils reactive to light and accommodation, EOMI CVS: S1-S2 clear, no murmur rubs or gallops Chest: clear to auscultation bilaterally, no wheezing, rales or rhonchi  Abdomen: soft nontender, nondistended, normal bowel sounds, no organomegaly Extremities: no cyanosis, clubbing or edema noted bilaterally Neuro: Cranial nerves II-XII intact, no focal  neurological deficits  Discharge Instructions You were cared for by a hospitalist during your hospital stay. If you have any questions about your discharge medications or the care you received while you were in the hospital after you are discharged, you can call the unit and asked to speak with the hospitalist on call if the hospitalist that took care of you is not available. Once you are discharged, your primary care physician will handle any further medical issues. Please note that NO REFILLS for any discharge medications will be authorized once you are discharged, as it is imperative that you return to your primary care physician (or establish a relationship with a primary care physician if you do not have one) for your aftercare needs so that they can reassess your need for medications and monitor your lab values.  Discharge Instructions    Diet - low sodium heart healthy    Complete by:  As directed      Increase activity slowly    Complete by:  As directed           Current Discharge Medication List    START taking these medications   Details  ciprofloxacin (CIPRO) 500 MG tablet Take 1 tablet (500 mg total) by mouth 2 (two) times daily. Qty: 20 tablet, Refills: 0    metroNIDAZOLE (FLAGYL) 500 MG tablet Take 1 tablet (500 mg total) by mouth 3 (three) times daily. Qty: 30 tablet, Refills: 0    predniSONE (DELTASONE) 20 MG tablet Take 2 tablets (40 mg total) by mouth daily with breakfast. Qty: 60 tablet, Refills: 0      CONTINUE these medications which have NOT CHANGED   Details  AMITIZA 24 MCG capsule Take 24 mcg by mouth at bedtime. Refills: 12    Azilsartan-Chlorthalidone (EDARBYCLOR) 40-25 MG TABS TAKE 1 TABLET BY MOUTH DAILY. Qty: 90 tablet, Refills: 1   Associated Diagnoses: Essential hypertension, benign    buPROPion (WELLBUTRIN SR) 100 MG 12 hr tablet Take 100 mg by mouth at bedtime.    fluticasone (FLONASE) 50 MCG/ACT nasal spray Place 2 sprays into both nostrils  daily as needed for allergies or rhinitis. Qty: 16 g, Refills: 11   Associated Diagnoses: Allergic rhinitis, cause unspecified    lamoTRIgine (LAMICTAL) 200 MG tablet Take 200 mg by mouth at bedtime.    Umeclidinium Bromide (INCRUSE ELLIPTA) 62.5 MCG/INH AEPB Inhale 1 puff into the lungs daily. Qty: 90 each, Refills: 3   Associated Diagnoses: COPD with chronic bronchitis      STOP taking these medications     amoxicillin-clavulanate (AUGMENTIN) 875-125 MG per tablet        Allergies  Allergen Reactions  . Lisinopril     cough  . Crestor [Rosuvastatin Calcium]     Muscle and joint aches  . Lipitor [Atorvastatin Calcium]     Muscle cramps  . Azathioprine Other (See Comments)    Side effects to pancrease         The results of significant diagnostics from this hospitalization (including imaging, microbiology, ancillary and laboratory) are listed below for reference.    Significant Diagnostic Studies: Dg Chest 2 View  03/11/2014   CLINICAL DATA:  Cough.  Pneumonia  EXAM: CHEST  2 VIEW  COMPARISON:  03/28/2013  FINDINGS: The heart size and mediastinal contours are within normal limits. Both lungs are clear. The  visualized skeletal structures are unremarkable.  IMPRESSION: No active cardiopulmonary disease.   Electronically Signed   By: Franchot Gallo M.D.   On: 03/11/2014 11:20   Ct Abdomen Pelvis W Contrast  04/05/2014   CLINICAL DATA:  Midline to RIGHT-side abdominal pain at upper and lower abdomen and nausea for 2 weeks, history diabetes and Crohn's disease, prior hysterectomy, appendectomy, small bowel resection  EXAM: CT ABDOMEN AND PELVIS WITH CONTRAST  TECHNIQUE: Multidetector CT imaging of the abdomen and pelvis was performed using the standard protocol following bolus administration of intravenous contrast. Sagittal and coronal MPR images reconstructed from axial data set.  CONTRAST:  159m OMNIPAQUE IOHEXOL 300 MG/ML SOLN IV. Dilute oral contrast.  COMPARISON:  04/13/2012   FINDINGS: Minimal chronic bibasilar atelectasis.  Tiny cyst anteriorly in liver 5 mm diameter image 16 unchanged.  Liver, spleen, pancreas, kidneys, and adrenal glands otherwise normal.  Prior ileocolic resection and anastomosis.  Focal inflammatory process identified in the RIGHT lower quadrant/pelvis, where multiple clustered small bowel loops and intervening and surrounding inflammatory changes are identified most likely related to Crohn's disease.  Several small fluid collections are associated with this inflammatory site though, due to lack of opacification of the distal small bowel loops, it is uncertain whether these represent unopacified small bowel loops or small abscess collections.  I am suspicious that at least the more anterior collection represents an abscess as this demonstrates slight wall thickening and enhancement, measuring 3.0 x 1.4 x potentially 3.7 cm.  No free intraperitoneal air or fluid identified.  Fluid filled nondistended RIGHT colon.  Remaining bowel loops normal without bowel dilatation or evidence of obstruction.  Small hiatal hernia.  Numerous normal sized mesenteric lymph nodes without gross adenopathy.  Atherosclerotic calcifications aorta.  No acute osseous findings.  IMPRESSION: Cluster of inflamed small bowel loops in the RIGHT mid abdomen/pelvis consistent with Crohn's disease and significant peri-intestinal inflammation.  Several fluid attenuation collections are associated with this phlegmon, some which certainly represent small bowel loops but am suspicious that there is at least an anterior abscess collection measuring 3.0 x 1.4 x 3.7 cm.  No evidence of bowel obstruction or free intraperitoneal air.   Electronically Signed   By: MLavonia DanaM.D.   On: 04/05/2014 16:29    Microbiology: No results found for this or any previous visit (from the past 240 hour(s)).   Labs: Basic Metabolic Panel:  Recent Labs Lab 04/05/14 1152 04/06/14 0516 04/07/14 0533  NA 137  138 138  K 3.5* 3.7 3.9  CL 99 100 102  CO2 23 23 23   GLUCOSE 124* 165* 131*  BUN 14 13 16   CREATININE 0.82 0.86 0.79  CALCIUM 9.2 9.0 9.2   Liver Function Tests:  Recent Labs Lab 04/05/14 1152 04/06/14 0516  AST 14 11  ALT 11 8  ALKPHOS 81 78  BILITOT 0.2* 0.4  PROT 7.8 7.5  ALBUMIN 3.5 3.2*    Recent Labs Lab 04/05/14 1152  LIPASE 35   No results for input(s): AMMONIA in the last 168 hours. CBC:  Recent Labs Lab 04/05/14 1152 04/06/14 0516 04/07/14 0533  WBC 18.2* 11.7* 13.0*  NEUTROABS 13.4*  --   --   HGB 14.2 13.2 12.6  HCT 43.2 40.6 39.0  MCV 91.3 91.2 90.9  PLT 339 324 375   Cardiac Enzymes: No results for input(s): CKTOTAL, CKMB, CKMBINDEX, TROPONINI in the last 168 hours. BNP: BNP (last 3 results) No results for input(s): PROBNP in the last 8760 hours.  CBG:  Recent Labs Lab 04/07/14 0734 04/07/14 1214 04/07/14 1726 04/07/14 2207 04/08/14 0751  GLUCAP 123* 176* 118* 118* 100*       Signed:  Simara Rhyner A  [facility] 04/08/2014, 9:44 AM

## 2014-04-08 NOTE — Progress Notes (Signed)
Subjective: Feeling well.  Objective: Vital signs in last 24 hours: Temp:  [98 F (36.7 C)-99 F (37.2 C)] 99 F (37.2 C) (11/26 0553) Pulse Rate:  [55-77] 59 (11/26 0553) Resp:  [16-17] 17 (11/26 0553) BP: (98-119)/(52-71) 98/52 mmHg (11/26 0553) SpO2:  [99 %-100 %] 99 % (11/26 0553) Last BM Date: 04/07/14  Intake/Output from previous day: 11/25 0701 - 11/26 0700 In: 1320 [P.O.:1320] Out: -  Intake/Output this shift:    General appearance: alert and no distress GI: soft, non-tender; bowel sounds normal; no masses,  no organomegaly  Lab Results:  Recent Labs  04/05/14 1152 04/06/14 0516 04/07/14 0533  WBC 18.2* 11.7* 13.0*  HGB 14.2 13.2 12.6  HCT 43.2 40.6 39.0  PLT 339 324 375   BMET  Recent Labs  04/05/14 1152 04/06/14 0516 04/07/14 0533  NA 137 138 138  K 3.5* 3.7 3.9  CL 99 100 102  CO2 23 23 23   GLUCOSE 124* 165* 131*  BUN 14 13 16   CREATININE 0.82 0.86 0.79  CALCIUM 9.2 9.0 9.2   LFT  Recent Labs  04/06/14 0516  PROT 7.5  ALBUMIN 3.2*  AST 11  ALT 8  ALKPHOS 78  BILITOT 0.4   PT/INR No results for input(s): LABPROT, INR in the last 72 hours. Hepatitis Panel No results for input(s): HEPBSAG, HCVAB, HEPAIGM, HEPBIGM in the last 72 hours. C-Diff No results for input(s): CDIFFTOX in the last 72 hours. Fecal Lactopherrin No results for input(s): FECLLACTOFRN in the last 72 hours.  Studies/Results: No results found.  Medications:  Scheduled: . buPROPion  100 mg Oral QHS  . ciprofloxacin  500 mg Oral BID  . enoxaparin (LOVENOX) injection  40 mg Subcutaneous Q24H  . irbesartan  150 mg Oral Daily  . lamoTRIgine  200 mg Oral QHS  . metroNIDAZOLE  500 mg Oral 3 times per day  . nicotine  21 mg Transdermal Daily  . predniSONE  40 mg Oral Q breakfast   Continuous:   Assessment/Plan: 1) Crohn's flare. 2) Intraabdominal abscess.   The patient is well and I agree with the D/C.  Plan: 1) Two weeks total of antibiotics. 2)  Prednisone 40 mg QD. 3) Follow up in the office in one week.  I will manage her steroids as an outpatient.   LOS: 3 days   Pat Sires D 04/08/2014, 9:50 AM

## 2014-05-14 DIAGNOSIS — K56609 Unspecified intestinal obstruction, unspecified as to partial versus complete obstruction: Secondary | ICD-10-CM

## 2014-05-14 HISTORY — DX: Unspecified intestinal obstruction, unspecified as to partial versus complete obstruction: K56.609

## 2014-05-17 ENCOUNTER — Other Ambulatory Visit: Payer: Self-pay | Admitting: Gastroenterology

## 2014-05-19 ENCOUNTER — Other Ambulatory Visit: Payer: Self-pay | Admitting: Gastroenterology

## 2014-05-19 ENCOUNTER — Inpatient Hospital Stay (HOSPITAL_COMMUNITY)
Admission: EM | Admit: 2014-05-19 | Discharge: 2014-05-24 | DRG: 372 | Disposition: A | Discharge status: Home / Self Care | Payer: Medicare Other | Attending: Internal Medicine | Admitting: Internal Medicine

## 2014-05-19 ENCOUNTER — Emergency Department (HOSPITAL_COMMUNITY): Payer: Medicare Other

## 2014-05-19 ENCOUNTER — Encounter (HOSPITAL_COMMUNITY): Payer: Self-pay | Admitting: *Deleted

## 2014-05-19 DIAGNOSIS — E876 Hypokalemia: Secondary | ICD-10-CM | POA: Diagnosis present

## 2014-05-19 DIAGNOSIS — Z7951 Long term (current) use of inhaled steroids: Secondary | ICD-10-CM

## 2014-05-19 DIAGNOSIS — T380X5A Adverse effect of glucocorticoids and synthetic analogues, initial encounter: Secondary | ICD-10-CM | POA: Diagnosis not present

## 2014-05-19 DIAGNOSIS — F329 Major depressive disorder, single episode, unspecified: Secondary | ICD-10-CM | POA: Diagnosis present

## 2014-05-19 DIAGNOSIS — Z90710 Acquired absence of both cervix and uterus: Secondary | ICD-10-CM

## 2014-05-19 DIAGNOSIS — I10 Essential (primary) hypertension: Secondary | ICD-10-CM | POA: Diagnosis not present

## 2014-05-19 DIAGNOSIS — R109 Unspecified abdominal pain: Secondary | ICD-10-CM | POA: Diagnosis not present

## 2014-05-19 DIAGNOSIS — K651 Peritoneal abscess: Principal | ICD-10-CM | POA: Insufficient documentation

## 2014-05-19 DIAGNOSIS — K5000 Crohn's disease of small intestine without complications: Secondary | ICD-10-CM | POA: Diagnosis not present

## 2014-05-19 DIAGNOSIS — F17210 Nicotine dependence, cigarettes, uncomplicated: Secondary | ICD-10-CM | POA: Diagnosis present

## 2014-05-19 DIAGNOSIS — Z9111 Patient's noncompliance with dietary regimen: Secondary | ICD-10-CM | POA: Diagnosis present

## 2014-05-19 DIAGNOSIS — K50914 Crohn's disease, unspecified, with abscess: Secondary | ICD-10-CM

## 2014-05-19 DIAGNOSIS — Z888 Allergy status to other drugs, medicaments and biological substances status: Secondary | ICD-10-CM | POA: Diagnosis not present

## 2014-05-19 DIAGNOSIS — R100 Acute abdomen: Secondary | ICD-10-CM | POA: Diagnosis not present

## 2014-05-19 DIAGNOSIS — R509 Fever, unspecified: Secondary | ICD-10-CM

## 2014-05-19 DIAGNOSIS — E119 Type 2 diabetes mellitus without complications: Secondary | ICD-10-CM | POA: Diagnosis present

## 2014-05-19 DIAGNOSIS — K50014 Crohn's disease of small intestine with abscess: Secondary | ICD-10-CM | POA: Diagnosis present

## 2014-05-19 DIAGNOSIS — K5090 Crohn's disease, unspecified, without complications: Secondary | ICD-10-CM

## 2014-05-19 DIAGNOSIS — E1165 Type 2 diabetes mellitus with hyperglycemia: Secondary | ICD-10-CM | POA: Diagnosis present

## 2014-05-19 DIAGNOSIS — IMO0002 Reserved for concepts with insufficient information to code with codable children: Secondary | ICD-10-CM | POA: Diagnosis present

## 2014-05-19 LAB — COMPREHENSIVE METABOLIC PANEL
ALBUMIN: 3.5 g/dL (ref 3.5–5.2)
ALK PHOS: 60 U/L (ref 39–117)
ALT: 16 U/L (ref 0–35)
ANION GAP: 13 (ref 5–15)
AST: 20 U/L (ref 0–37)
BUN: 7 mg/dL (ref 6–23)
CALCIUM: 9.3 mg/dL (ref 8.4–10.5)
CO2: 21 mmol/L (ref 19–32)
CREATININE: 0.77 mg/dL (ref 0.50–1.10)
Chloride: 102 mEq/L (ref 96–112)
GFR calc Af Amer: >90 mL/min (ref >90)
GFR calc non Af Amer: >90 mL/min (ref >90)
Glucose, Bld: 95 mg/dL (ref 70–99)
Potassium: 3.6 mmol/L (ref 3.5–5.1)
SODIUM: 136 mmol/L (ref 135–145)
TOTAL PROTEIN: 7.2 g/dL (ref 6.0–8.3)
Total Bilirubin: 0.7 mg/dL (ref 0.3–1.2)

## 2014-05-19 LAB — URINALYSIS, ROUTINE W REFLEX MICROSCOPIC
Bilirubin Urine: NEGATIVE
GLUCOSE, UA: NEGATIVE mg/dL
Hgb urine dipstick: NEGATIVE
KETONES UR: NEGATIVE mg/dL
Leukocytes, UA: NEGATIVE
Nitrite: NEGATIVE
PH: 5.5 (ref 5.0–8.0)
Protein, ur: NEGATIVE mg/dL
SPECIFIC GRAVITY, URINE: 1.012 (ref 1.005–1.030)
Urobilinogen, UA: 0.2 mg/dL (ref 0.0–1.0)

## 2014-05-19 LAB — CBC WITH DIFFERENTIAL/PLATELET
BASOS PCT: 0 % (ref 0–1)
Basophils Absolute: 0.1 10*3/uL (ref 0.0–0.1)
EOS ABS: 0.1 10*3/uL (ref 0.0–0.7)
EOS PCT: 0 % (ref 0–5)
HEMATOCRIT: 42.4 % (ref 36.0–46.0)
HEMOGLOBIN: 13.9 g/dL (ref 12.0–15.0)
LYMPHS ABS: 3.3 10*3/uL (ref 0.7–4.0)
LYMPHS PCT: 15 % (ref 12–46)
MCH: 30.5 pg (ref 26.0–34.0)
MCHC: 32.8 g/dL (ref 30.0–36.0)
MCV: 93 fL (ref 78.0–100.0)
Monocytes Absolute: 2.5 10*3/uL — ABNORMAL HIGH (ref 0.1–1.0)
Monocytes Relative: 12 % (ref 3–12)
NEUTROS ABS: 15.9 10*3/uL — AB (ref 1.7–7.7)
NEUTROS PCT: 73 % (ref 43–77)
PLATELETS: 333 10*3/uL (ref 150–400)
RBC: 4.56 MIL/uL (ref 3.87–5.11)
RDW: 14.3 % (ref 11.5–15.5)
WBC: 21.8 10*3/uL — ABNORMAL HIGH (ref 4.0–10.5)

## 2014-05-19 LAB — I-STAT CG4 LACTIC ACID, ED: Lactic Acid, Venous: 1.54 mmol/L (ref 0.5–2.2)

## 2014-05-19 LAB — LIPASE, BLOOD: LIPASE: 32 U/L (ref 11–59)

## 2014-05-19 MED ORDER — ACETAMINOPHEN 325 MG PO TABS
650.0000 mg | ORAL_TABLET | Freq: Four times a day (QID) | ORAL | Status: DC | PRN
  Administered 2014-05-19 – 2014-05-21 (×3): 650 mg via ORAL
  Filled 2014-05-19 (×4): qty 2

## 2014-05-19 MED ORDER — METRONIDAZOLE IN NACL 5-0.79 MG/ML-% IV SOLN
500.0000 mg | Freq: Three times a day (TID) | INTRAVENOUS | Status: DC
  Administered 2014-05-20: 500 mg via INTRAVENOUS
  Filled 2014-05-19: qty 100

## 2014-05-19 MED ORDER — IOHEXOL 300 MG/ML  SOLN
100.0000 mL | Freq: Once | INTRAMUSCULAR | Status: AC | PRN
  Administered 2014-05-19: 100 mL via INTRAVENOUS

## 2014-05-19 MED ORDER — SODIUM CHLORIDE 0.9 % IV BOLUS (SEPSIS)
2000.0000 mL | Freq: Once | INTRAVENOUS | Status: AC
  Administered 2014-05-19: 1000 mL via INTRAVENOUS

## 2014-05-19 MED ORDER — MORPHINE SULFATE 4 MG/ML IJ SOLN
4.0000 mg | Freq: Once | INTRAMUSCULAR | Status: AC
Start: 2014-05-19 — End: 2014-05-19
  Administered 2014-05-19: 4 mg via INTRAVENOUS
  Filled 2014-05-19: qty 1

## 2014-05-19 MED ORDER — IOHEXOL 300 MG/ML  SOLN
25.0000 mL | Freq: Once | INTRAMUSCULAR | Status: AC | PRN
  Administered 2014-05-19: 25 mL via ORAL

## 2014-05-19 NOTE — ED Notes (Signed)
PT returned to bed. Monitored by pulse ox and bp cuff.

## 2014-05-19 NOTE — ED Notes (Signed)
Pt finished drinking oral contrast. CT notified.  

## 2014-05-19 NOTE — ED Provider Notes (Signed)
CSN: [id number]     Arrival date & time 05/19/14  1758 History   First MD Initiated Contact with Patient 05/19/14 1842     Chief Complaint  Patient presents with  . Abdominal Pain  . Fever     (Consider location/radiation/quality/duration/timing/severity/associated sxs/prior Treatment) HPI Comments: April May is a 59 y.o. female with a PMHx of HTN, crohn's disease, DM2, and depression, recently admitted on 04/05/2014 for crohn's flare and intraabdominal abscess, who presents to the ED with complaints of sudden onset abdominal pain that began at 8:30 this morning. She states that she has 7/10 generalized constant throbbing nonradiating pain throughout her entire abdomen, but worse in the suprapubic lower area of her belly, with no known aggravating factors, and mildly improved with the Tylenol given in triage, but she has not tried anything else for her pain. She states that when she was discharged several weeks ago after her abdominal abscess, she was sent home on prednisone which she is currently tapering off of and is at a dose of 5 mg daily. She states that her pain had been improved until today. Upon arrival she was found to have a fever of 102.8 oral and she was given Tylenol which decreased her temperature to 99.1 at this time. She endorses a mild headache at this time with lightheadedness and feeling weak, but no vision changes or focal neuro deficits at this time. She states her headache is consistent with prior headaches in the past, and was not sudden onset. She states is improving with the Tylenol. She states she has chronic constipation, but isn't taking Amitiza and her last bowel movement was this morning. Denies any chest pain, shortness of breath, nausea, vomiting, diarrhea, obstipation, hematochezia, melena, dysuria, hematuria, vaginal bleeding or discharge, sick contacts, recent travel, suspicious food intake, or alcohol use. Denies any paresthesias, weakness, or  numbness.  Patient is a 59 y.o. female presenting with abdominal pain. The history is provided by the patient. No language interpreter was used.  Abdominal Pain Pain location:  Generalized Pain quality: throbbing   Pain radiates to:  Does not radiate Pain severity:  Moderate (7/10) Onset quality:  Sudden Duration:  10 hours Timing:  Constant Progression:  Unchanged Chronicity:  Recurrent Context: recent illness   Relieved by:  Acetaminophen Worsened by:  Nothing tried Ineffective treatments:  None tried Associated symptoms: constipation (chronic, last BM this AM), fatigue and fever (102.8 oral in triage)   Associated symptoms: no anorexia, no belching, no chest pain, no cough, no diarrhea, no dysuria, no flatus, no hematemesis, no hematochezia, no hematuria, no melena, no nausea, no shortness of breath, no sore throat, no vaginal bleeding, no vaginal discharge and no vomiting   Risk factors: recent hospitalization     Past Medical History  Diagnosis Date  . Allergy   . Depression   . Hypertension   . Crohn disease   . Diabetes mellitus type II    Past Surgical History  Procedure Laterality Date  . Appendectomy    . Abdominal hysterectomy    . Small intestine surgery    . Breast surgery     Family History  Problem Relation Age of Onset  . Hypertension Mother   . Arthritis Father   . Alcohol abuse Paternal Uncle   . Diabetes Paternal Grandmother   . Stroke Paternal Grandmother    History  Substance Use Topics  . Smoking status: Current Every Day Smoker -- 1.00 packs/day for 35 years    Types:  Cigarettes  . Smokeless tobacco: Never Used  . Alcohol Use: No   OB History    No data available     Review of Systems  Constitutional: Positive for fever (102.8 oral in triage) and fatigue.  HENT: Negative for congestion and sore throat.   Eyes: Negative for pain and visual disturbance.  Respiratory: Negative for cough and shortness of breath.   Cardiovascular:  Negative for chest pain.  Gastrointestinal: Positive for abdominal pain and constipation (chronic, last BM this AM). Negative for nausea, vomiting, diarrhea, blood in stool, melena, hematochezia, anal bleeding, rectal pain, anorexia, flatus and hematemesis.  Genitourinary: Negative for dysuria, frequency, hematuria, flank pain, vaginal bleeding, vaginal discharge and difficulty urinating.  Musculoskeletal: Negative for myalgias, back pain, arthralgias and neck pain.  Skin: Negative for color change.  Neurological: Positive for weakness (generalized), light-headedness (mild) and headaches (mild). Negative for dizziness, syncope and numbness.  Psychiatric/Behavioral: Negative for confusion.   10 Systems reviewed and are negative for acute change except as noted in the HPI.    Allergies  Lisinopril; Crestor; Lipitor; and Azathioprine  Home Medications   Prior to Admission medications   Medication Sig Start Date End Date Taking? Authorizing Provider  AMITIZA 24 MCG capsule Take 24 mcg by mouth at bedtime. 03/19/14   Historical Provider, MD  Azilsartan-Chlorthalidone (EDARBYCLOR) 40-25 MG TABS TAKE 1 TABLET BY MOUTH DAILY. 01/12/14   Hendricks Limes, MD  buPROPion (WELLBUTRIN SR) 100 MG 12 hr tablet Take 100 mg by mouth at bedtime.    Historical Provider, MD  ciprofloxacin (CIPRO) 500 MG tablet Take 1 tablet (500 mg total) by mouth 2 (two) times daily. 04/08/14   Verlee Monte, MD  fluticasone (FLONASE) 50 MCG/ACT nasal spray Place 2 sprays into both nostrils daily as needed for allergies or rhinitis. 09/14/13   Janith Lima, MD  lamoTRIgine (LAMICTAL) 200 MG tablet Take 200 mg by mouth at bedtime.    Historical Provider, MD  metroNIDAZOLE (FLAGYL) 500 MG tablet Take 1 tablet (500 mg total) by mouth 3 (three) times daily. 04/08/14   Verlee Monte, MD  predniSONE (DELTASONE) 20 MG tablet Take 2 tablets (40 mg total) by mouth daily with breakfast. 04/08/14   Verlee Monte, MD  Umeclidinium Bromide  (INCRUSE ELLIPTA) 62.5 MCG/INH AEPB Inhale 1 puff into the lungs daily. Patient not taking: Reported on 04/05/2014 03/11/14   Janith Lima, MD   BP 98/51 mmHg  Pulse 94  Temp(Src) 99.1 F (37.3 C) (Oral)  Resp 18  SpO2 96%  LMP 05/14/1988 Physical Exam  Constitutional: She is oriented to person, place, and time. She appears well-developed and well-nourished.  Non-toxic appearance. She appears distressed.  Febrile in triage, currently afebrile, nontoxic appearing but appears uncomfortable, BP noted to be 98/51 which is consistent with prior ED visits  HENT:  Head: Normocephalic and atraumatic.  Mouth/Throat: Oropharynx is clear and moist. Mucous membranes are dry.  Dry mucous membranes  Eyes: Conjunctivae and EOM are normal. Pupils are equal, round, and reactive to light. Right eye exhibits no discharge. Left eye exhibits no discharge.  PERRL EOMI  Neck: Normal range of motion. Neck supple. No spinous process tenderness and no muscular tenderness present. No rigidity. Normal range of motion present.  FROM intact without spinous process or paraspinous muscle TTP, no bony stepoffs or deformities, no muscle spasms. No rigidity or meningeal signs. No bruising or swelling.   Cardiovascular: Normal rate, regular rhythm, normal heart sounds and intact distal pulses.  Exam reveals  no gallop and no friction rub.   No murmur heard. Pulmonary/Chest: Effort normal and breath sounds normal. No respiratory distress. She has no decreased breath sounds. She has no wheezes. She has no rhonchi. She has no rales.  Abdominal: Soft. Normal appearance and bowel sounds are normal. She exhibits no distension. There is generalized tenderness. There is no rigidity, no rebound, no guarding, no CVA tenderness, no tenderness at McBurney's point and negative Murphy's sign.    Soft, obese but ND, +BS throughout, with diffuse abdominal TTP throughout but most exquisitely in suprapubic region, no r/g/r, neg murphy's, neg  mcburney's, no CVA TTP   Musculoskeletal: Normal range of motion.  MAE x4 Strength and sensation grossly intact  Neurological: She is alert and oriented to person, place, and time. She has normal strength. No cranial nerve deficit or sensory deficit.  No focal neuro deficits  Skin: Skin is warm, dry and intact. No rash noted.  Psychiatric: She has a normal mood and affect.  Nursing note and vitals reviewed.   ED Course  Procedures (including critical care time) Labs Review Labs Reviewed  CBC WITH DIFFERENTIAL - Abnormal; Notable for the following:    WBC 21.8 (*)    Neutro Abs 15.9 (*)    Monocytes Absolute 2.5 (*)    All other components within normal limits  COMPREHENSIVE METABOLIC PANEL  LIPASE, BLOOD  URINALYSIS, ROUTINE W REFLEX MICROSCOPIC  I-STAT CG4 LACTIC ACID, ED    Imaging Review Ct Abdomen Pelvis W Contrast  05/19/2014   CLINICAL DATA:  Initial evaluation for acute abdominal pain.  EXAM: CT ABDOMEN AND PELVIS WITH CONTRAST  TECHNIQUE: Multidetector CT imaging of the abdomen and pelvis was performed using the standard protocol following bolus administration of intravenous contrast.  CONTRAST:  13mL OMNIPAQUE IOHEXOL 300 MG/ML SOLN, 23mL OMNIPAQUE IOHEXOL 300 MG/ML SOLN  COMPARISON:  Prior study from 04/05/2014.  FINDINGS: The visualized lung bases are clear. No pleural or pericardial effusion.  Subcentimeter hypodensity within the left hepatic lobe noted. This is too small the characterize by CT, but may reflect a small cysts, and is stable from prior study. The liver is otherwise unremarkable. Gallbladder within normal limits. No biliary dilatation. The spleen, adrenal glands, and pancreas demonstrate a normal contrast enhanced appearance.  Kidneys are equal size with symmetric enhancement. No nephrolithiasis, hydronephrosis, or focal enhancing renal mass.  Small hiatal hernia noted. Stomach otherwise unremarkable. Sequela of prior ileocolic resection with primary  reanastomosis again seen.  There is a cluster of markedly inflamed loops of small bowel within the right mid abdomen closely approximated to the surgical anastomosis. There is a somewhat ill-defined hypodense fluid collection 5.3 x 3.5 x 3.6 cm within this region (series 201, image 51). Additional hypodense collection located more anteriorly and medially measures approximately 1.7 x 2.3 x 3.2 cm (series 2001, image 51). These 2 collections may be contiguous with one another. While these may reflect on opacified loops of bowel, the irregular peripheral enhancement about these collections raises the possibility of intra-abdominal abscesses. Overall, the degree of inflammation and phlegmonous change has worsened as compared to most recent CT from 04/05/2014.  Remainder of the bowel is unremarkable without evidence of obstruction or inflammation.  Bladder within normal limits.  Uterus and ovaries are absent.  No free air. Small volume free fluid present within the pelvis, which is likely reactive in nature.  Scattered mesenteric lymph nodes are similar relative to prior study. No pathologic Lee enlarged intra-abdominal pelvic lymph nodes identified.  Moderate aorto bi-iliac atherosclerotic disease present. No aneurysm.  No acute osseous abnormality. No worrisome lytic or blastic osseous lesions.  IMPRESSION: Multiple clustered inflamed loops of bowel within the right abdomen, likely related to known history of Crohn's disease. Several hypodense collections impregnated within this area of inflammation are again seen, the largest of which measures approximately 5.3 x 3.5 x 3.6 cm. While these may reflect unopacified loops of bowel, findings are again suspicious for possible associated intra-abdominal abscesses. Overall, the degree of inflammation and phlegmonous change has slightly worsened relative to most recent CT from 04/05/2014. No free air or associated obstruction.   Electronically Signed   By: Jeannine Boga  M.D.   On: 05/19/2014 23:35     EKG Interpretation None      MDM   Final diagnoses:  Abdominal pain, acute  Intra-abdominal abscess  Fever, unspecified fever cause    59 y.o. female with fever and generalized abd pain. Tylenol improved fever down to 99.1, pt with mild hypotension c/w prior ED visits, will give 2L bolus. Lactic acid WNL. CMP WNL. Lipase WNL. CBC with leukocytosis of 21.8. Will obtain U/A and CT. Likely admit. Will give morphine low-dose for pain now, but will monitor BP closely.  11:46 PM CT abd showing worsened intraabd abscess. U/A neg. Will consult [facility]. Of note, after fluids, BP has remained stable. No recurrent fevers. Pain and nausea currently controlled.  11:53 PM Dr. Hulen Skains returning page. Wants zosyn/flagyl and recommends percutaneous intervention but wants medical admission. Started abx, will consult for admission with [facility].  12:25 AM Dr. Hulen Skains calling back stating that pt isn't surgical candidate and this is definitely going to need to be medically managed with IV abx and steroids, and have GI follow along while pt is in the hospital. Dr. Benson Norway is her GI specialist. Awaiting hospitalist returning call.  12:46 AM Dr. Alcario Drought down to see pt ([facility]), will admit. Please see his dictation for further documentation of care.   BP 112/54 mmHg  Pulse 80  Temp(Src) 98.8 F (37.1 C) (Oral)  Resp 18  SpO2 97%  LMP 05/14/1988  Meds ordered this encounter  Medications  . acetaminophen (TYLENOL) tablet 650 mg    Sig:   . sodium chloride 0.9 % bolus 2,000 mL    Sig:   . morphine 4 MG/ML injection 4 mg    Sig:   . iohexol (OMNIPAQUE) 300 MG/ML solution 25 mL    Sig:   . iohexol (OMNIPAQUE) 300 MG/ML solution 100 mL    Sig:   . metroNIDAZOLE (FLAGYL) IVPB 500 mg    Sig:     Order Specific Question:  Antibiotic Indication:    Answer:  Intra-abdominal Infection  . piperacillin-tazobactam (ZOSYN) IVPB 3.375 g    Sig:     Order Specific Question:  Antibiotic  Indication:    Answer:  Intra-abdominal Infection     Patty Sermons Camprubi-Soms, PA-C 05/20/14 1914  Debby Freiberg, MD 05/21/14 [phone]

## 2014-05-19 NOTE — ED Notes (Signed)
PT ambulated to bathroom. Steady gait. Pt denies dizziness or light headedness.

## 2014-05-19 NOTE — ED Notes (Signed)
Pt in c/o abd pain and fever since earlier, recently admitted with crohn's and an abscess in her stomach, symptoms worse today

## 2014-05-19 NOTE — ED Notes (Signed)
PA Mercedes at bedside.

## 2014-05-20 ENCOUNTER — Encounter (HOSPITAL_COMMUNITY): Payer: Self-pay | Admitting: General Practice

## 2014-05-20 DIAGNOSIS — Z90710 Acquired absence of both cervix and uterus: Secondary | ICD-10-CM | POA: Diagnosis not present

## 2014-05-20 DIAGNOSIS — T380X5A Adverse effect of glucocorticoids and synthetic analogues, initial encounter: Secondary | ICD-10-CM | POA: Diagnosis present

## 2014-05-20 DIAGNOSIS — F329 Major depressive disorder, single episode, unspecified: Secondary | ICD-10-CM | POA: Diagnosis present

## 2014-05-20 DIAGNOSIS — R933 Abnormal findings on diagnostic imaging of other parts of digestive tract: Secondary | ICD-10-CM | POA: Diagnosis not present

## 2014-05-20 DIAGNOSIS — K5000 Crohn's disease of small intestine without complications: Secondary | ICD-10-CM | POA: Diagnosis present

## 2014-05-20 DIAGNOSIS — K50918 Crohn's disease, unspecified, with other complication: Secondary | ICD-10-CM | POA: Diagnosis not present

## 2014-05-20 DIAGNOSIS — Z7951 Long term (current) use of inhaled steroids: Secondary | ICD-10-CM | POA: Diagnosis not present

## 2014-05-20 DIAGNOSIS — I10 Essential (primary) hypertension: Secondary | ICD-10-CM

## 2014-05-20 DIAGNOSIS — Z9111 Patient's noncompliance with dietary regimen: Secondary | ICD-10-CM | POA: Diagnosis present

## 2014-05-20 DIAGNOSIS — F17210 Nicotine dependence, cigarettes, uncomplicated: Secondary | ICD-10-CM | POA: Diagnosis present

## 2014-05-20 DIAGNOSIS — E876 Hypokalemia: Secondary | ICD-10-CM | POA: Diagnosis present

## 2014-05-20 DIAGNOSIS — K5090 Crohn's disease, unspecified, without complications: Secondary | ICD-10-CM | POA: Insufficient documentation

## 2014-05-20 DIAGNOSIS — E1165 Type 2 diabetes mellitus with hyperglycemia: Secondary | ICD-10-CM | POA: Diagnosis not present

## 2014-05-20 DIAGNOSIS — K50014 Crohn's disease of small intestine with abscess: Secondary | ICD-10-CM | POA: Diagnosis not present

## 2014-05-20 DIAGNOSIS — Z888 Allergy status to other drugs, medicaments and biological substances status: Secondary | ICD-10-CM | POA: Diagnosis not present

## 2014-05-20 DIAGNOSIS — E119 Type 2 diabetes mellitus without complications: Secondary | ICD-10-CM | POA: Diagnosis present

## 2014-05-20 DIAGNOSIS — R100 Acute abdomen: Secondary | ICD-10-CM | POA: Diagnosis not present

## 2014-05-20 DIAGNOSIS — K651 Peritoneal abscess: Secondary | ICD-10-CM | POA: Diagnosis not present

## 2014-05-20 DIAGNOSIS — R1084 Generalized abdominal pain: Secondary | ICD-10-CM | POA: Diagnosis not present

## 2014-05-20 DIAGNOSIS — R109 Unspecified abdominal pain: Secondary | ICD-10-CM | POA: Diagnosis present

## 2014-05-20 LAB — BASIC METABOLIC PANEL
Anion gap: 6 (ref 5–15)
BUN: <5 mg/dL — ABNORMAL LOW (ref 6–23)
CALCIUM: 8.2 mg/dL — AB (ref 8.4–10.5)
CHLORIDE: 102 meq/L (ref 96–112)
CO2: 27 mmol/L (ref 19–32)
Creatinine, Ser: 0.69 mg/dL (ref 0.50–1.10)
GFR calc non Af Amer: >90 mL/min (ref >90)
Glucose, Bld: 108 mg/dL — ABNORMAL HIGH (ref 70–99)
Potassium: 3 mmol/L — ABNORMAL LOW (ref 3.5–5.1)
SODIUM: 135 mmol/L (ref 135–145)

## 2014-05-20 LAB — GLUCOSE, CAPILLARY
GLUCOSE-CAPILLARY: 98 mg/dL (ref 70–99)
Glucose-Capillary: 106 mg/dL — ABNORMAL HIGH (ref 70–99)
Glucose-Capillary: 98 mg/dL (ref 70–99)
Glucose-Capillary: 143 mg/dL — ABNORMAL HIGH (ref 70–99)

## 2014-05-20 LAB — CBC
HCT: 37.7 % (ref 36.0–46.0)
Hemoglobin: 12.3 g/dL (ref 12.0–15.0)
MCH: 30.4 pg (ref 26.0–34.0)
MCHC: 32.6 g/dL (ref 30.0–36.0)
MCV: 93.1 fL (ref 78.0–100.0)
Platelets: 291 10*3/uL (ref 150–400)
RBC: 4.05 MIL/uL (ref 3.87–5.11)
RDW: 14.4 % (ref 11.5–15.5)
WBC: 15.4 10*3/uL — ABNORMAL HIGH (ref 4.0–10.5)

## 2014-05-20 LAB — CBG MONITORING, ED: Glucose-Capillary: 111 mg/dL — ABNORMAL HIGH (ref 70–99)

## 2014-05-20 LAB — MAGNESIUM: MAGNESIUM: 1.6 mg/dL (ref 1.5–2.5)

## 2014-05-20 MED ORDER — MORPHINE SULFATE 4 MG/ML IJ SOLN
4.0000 mg | Freq: Once | INTRAMUSCULAR | Status: AC
  Administered 2014-05-20: 4 mg via INTRAVENOUS
  Filled 2014-05-20: qty 1

## 2014-05-20 MED ORDER — SODIUM CHLORIDE 0.9 % IV SOLN
INTRAVENOUS | Status: DC
  Administered 2014-05-20: 03:00:00 via INTRAVENOUS

## 2014-05-20 MED ORDER — METHYLPREDNISOLONE SODIUM SUCC 40 MG IJ SOLR
40.0000 mg | Freq: Two times a day (BID) | INTRAMUSCULAR | Status: DC
  Administered 2014-05-20 – 2014-05-21 (×4): 40 mg via INTRAVENOUS
  Filled 2014-05-20 (×6): qty 1

## 2014-05-20 MED ORDER — FLUTICASONE PROPIONATE 50 MCG/ACT NA SUSP: 2.0000 | Freq: Every day | NASAL | Status: DC | PRN

## 2014-05-20 MED ORDER — PIPERACILLIN-TAZOBACTAM 3.375 G IVPB
3.3750 g | Freq: Three times a day (TID) | INTRAVENOUS | Status: DC
  Administered 2014-05-20 – 2014-05-24 (×14): 3.375 g via INTRAVENOUS
  Filled 2014-05-20 (×16): qty 50

## 2014-05-20 MED ORDER — POTASSIUM CHLORIDE IN NACL 40-0.9 MEQ/L-% IV SOLN
INTRAVENOUS | Status: AC
  Administered 2014-05-20 (×2): 125 mL/h via INTRAVENOUS
  Filled 2014-05-20 (×3): qty 1000

## 2014-05-20 MED ORDER — POTASSIUM CHLORIDE 10 MEQ/100ML IV SOLN
10.0000 meq | INTRAVENOUS | Status: AC
Start: 2014-05-20 — End: 2014-05-20
  Administered 2014-05-20 (×4): 10 meq via INTRAVENOUS
  Filled 2014-05-20 (×4): qty 100

## 2014-05-20 MED ORDER — SODIUM CHLORIDE 0.9 % IJ SOLN
3.0000 mL | Freq: Two times a day (BID) | INTRAMUSCULAR | Status: DC
  Administered 2014-05-20 – 2014-05-24 (×10): 3 mL via INTRAVENOUS

## 2014-05-20 MED ORDER — MORPHINE SULFATE 2 MG/ML IJ SOLN
2.0000 mg | INTRAMUSCULAR | Status: DC | PRN
  Administered 2014-05-20 (×4): 4 mg via INTRAVENOUS
  Filled 2014-05-20 (×4): qty 2

## 2014-05-20 MED ORDER — ONDANSETRON HCL 4 MG/2ML IJ SOLN
4.0000 mg | Freq: Four times a day (QID) | INTRAMUSCULAR | Status: DC | PRN
  Administered 2014-05-20: 4 mg via INTRAVENOUS
  Filled 2014-05-20: qty 2

## 2014-05-20 MED ORDER — LAMOTRIGINE 200 MG PO TABS
200.0000 mg | ORAL_TABLET | Freq: Every day | ORAL | Status: DC
  Administered 2014-05-20 – 2014-05-23 (×4): 200 mg via ORAL
  Filled 2014-05-20 (×5): qty 1

## 2014-05-20 MED ORDER — BUPROPION HCL ER (SR) 100 MG PO TB12
100.0000 mg | ORAL_TABLET | Freq: Every day | ORAL | Status: DC
  Administered 2014-05-20 – 2014-05-23 (×4): 100 mg via ORAL
  Filled 2014-05-20 (×5): qty 1

## 2014-05-20 MED ORDER — PREDNISONE 5 MG PO TABS
5.0000 mg | ORAL_TABLET | Freq: Every day | ORAL | Status: DC
  Administered 2014-05-20: 5 mg via ORAL
  Filled 2014-05-20 (×2): qty 1

## 2014-05-20 NOTE — Consult Note (Signed)
[facility] Consult Note Reason for Consult: Abdominal pain with Intraabdominal abscess Referring Physician: Dr. Jennette Kettle PCP: Dr. Scarlette Calico GI: Dr. Carol Ada  April May is an 59 y.o. female.  HPI: April May is a 59 y.o. female with a PMHx of HTN, crohn's disease, DM2, and depression, recently admitted on 04/05/2014 for crohn's flare with associated intraabdominal abscess. Last month's abscess was treated medically with ABx and bowel rest and patient was discharged home on cipro/flagyl (length of therapy = 2 weeks) and prednisone taper. Daily prednisone dose is currently to 57m. [name] to the ED with complaints of sudden onset abdominal pain that began at 8:30 am on 05/19/2014. She states that she experienced 7/10 generalized constant throbbing pain throughout her entire abdomen, but locally worse in the suprapubic and lower quadrants of her abdomen. No reported aggravating factors, and endorses mild improvement with Tylenol.  She states that her pain, appetite and bowel movements had been improved until 05/19/2014. Upon arrival in ED she was found to have a fever of 102.8 and leukocytosis with normal lactate level. She endorses history of chronic constipation, but isn't taking Amitiza and her last bowel movement was morning of 05/19/2014. Reports several days of anorexia and general malaise PTA. CT A/P from 05/19/2014: Multiple clustered inflamed loops of bowel within the right abdomen, likely related to known history of Crohn's disease. Several hypodense collections impregnated within this area of inflammation are again seen, the largest of which measures approximately 5.3 x 3.5 x 3.6 cm. While these may reflect unopacified loops of bowel, findings are again suspicious for possible associated intra-abdominal abscesses. Overall, the degree of inflammation and phlegmonous change has slightly worsened relative to most recent CT from 04/05/2014. No free air  or associated obstruction.    Past Medical History  Diagnosis Date  . Allergy   . Depression   . Hypertension   . Crohn disease   . Diabetes mellitus type II     Past Surgical History  Procedure Laterality Date  . Appendectomy    . Abdominal hysterectomy    . Small intestine surgery    . Breast surgery      Family History  Problem Relation Age of Onset  . Hypertension Mother   . Arthritis Father   . Alcohol abuse Paternal Uncle   . Diabetes Paternal Grandmother   . Stroke Paternal Grandmother     Social History:  reports that she has been smoking Cigarettes.  She has a 35 pack-year smoking history. She has never used smokeless tobacco. She reports that she does not drink alcohol or use illicit drugs.  Allergies:  Allergies  Allergen Reactions  . Lisinopril     cough  . Crestor [Rosuvastatin Calcium]     Muscle and joint aches  . Lipitor [Atorvastatin Calcium]     Muscle cramps  . Azathioprine Other (See Comments)    Side effects to pancrease       Medications: I have reviewed the patient's current medications.  Results for orders placed or performed during the hospital encounter of 05/19/14 (from the past 48 hour(s))  CBC with Differential     Status: Abnormal   Collection Time: 05/19/14  6:16 PM  Result Value Ref Range   WBC 21.8 (H) 4.0 - 10.5 K/uL   RBC 4.56 3.87 - 5.11 MIL/uL   Hemoglobin 13.9 12.0 - 15.0 g/dL   HCT 42.4 36.0 - 46.0 %   MCV 93.0 78.0 - 100.0 fL  MCH 30.5 26.0 - 34.0 pg   MCHC 32.8 30.0 - 36.0 g/dL   RDW 14.3 11.5 - 15.5 %   Platelets 333 150 - 400 K/uL   Neutrophils Relative % 73 43 - 77 %   Neutro Abs 15.9 (H) 1.7 - 7.7 K/uL   Lymphocytes Relative 15 12 - 46 %   Lymphs Abs 3.3 0.7 - 4.0 K/uL   Monocytes Relative 12 3 - 12 %   Monocytes Absolute 2.5 (H) 0.1 - 1.0 K/uL   Eosinophils Relative 0 0 - 5 %   Eosinophils Absolute 0.1 0.0 - 0.7 K/uL   Basophils Relative 0 0 - 1 %   Basophils Absolute 0.1 0.0 - 0.1 K/uL  Comprehensive  metabolic panel     Status: None   Collection Time: 05/19/14  6:16 PM  Result Value Ref Range   Sodium 136 135 - 145 mmol/L    Comment: Please note change in reference range.   Potassium 3.6 3.5 - 5.1 mmol/L    Comment: Please note change in reference range.   Chloride 102 96 - 112 mEq/L   CO2 21 19 - 32 mmol/L   Glucose, Bld 95 70 - 99 mg/dL   BUN 7 6 - 23 mg/dL   Creatinine, Ser 0.77 0.50 - 1.10 mg/dL   Calcium 9.3 8.4 - 10.5 mg/dL   Total Protein 7.2 6.0 - 8.3 g/dL   Albumin 3.5 3.5 - 5.2 g/dL   AST 20 0 - 37 U/L   ALT 16 0 - 35 U/L   Alkaline Phosphatase 60 39 - 117 U/L   Total Bilirubin 0.7 0.3 - 1.2 mg/dL   GFR calc non Af Amer >90 >90 mL/min   GFR calc Af Amer >90 >90 mL/min    Comment: (NOTE) The eGFR has been calculated using the CKD EPI equation. This calculation has not been validated in all clinical situations. eGFR's persistently <90 mL/min signify possible Chronic Kidney Disease.    Anion gap 13 5 - 15  Lipase, blood     Status: None   Collection Time: 05/19/14  6:16 PM  Result Value Ref Range   Lipase 32 11 - 59 U/L  I-Stat CG4 Lactic Acid, ED     Status: None   Collection Time: 05/19/14  6:38 PM  Result Value Ref Range   Lactic Acid, Venous 1.54 0.5 - 2.2 mmol/L  Urinalysis, Routine w reflex microscopic     Status: None   Collection Time: 05/19/14  9:14 PM  Result Value Ref Range   Color, Urine YELLOW YELLOW   APPearance CLEAR CLEAR   Specific Gravity, Urine 1.012 1.005 - 1.030   pH 5.5 5.0 - 8.0   Glucose, UA NEGATIVE NEGATIVE mg/dL   Hgb urine dipstick NEGATIVE NEGATIVE   Bilirubin Urine NEGATIVE NEGATIVE   Ketones, ur NEGATIVE NEGATIVE mg/dL   Protein, ur NEGATIVE NEGATIVE mg/dL   Urobilinogen, UA 0.2 0.0 - 1.0 mg/dL   Nitrite NEGATIVE NEGATIVE   Leukocytes, UA NEGATIVE NEGATIVE    Comment: MICROSCOPIC NOT DONE ON URINES WITH NEGATIVE PROTEIN, BLOOD, LEUKOCYTES, NITRITE, OR GLUCOSE <1000 mg/dL.  CBC     Status: Abnormal   Collection Time:  05/20/14  2:54 AM  Result Value Ref Range   WBC 15.4 (H) 4.0 - 10.5 K/uL   RBC 4.05 3.87 - 5.11 MIL/uL   Hemoglobin 12.3 12.0 - 15.0 g/dL   HCT 37.7 36.0 - 46.0 %   MCV 93.1 78.0 - 100.0 fL  MCH 30.4 26.0 - 34.0 pg   MCHC 32.6 30.0 - 36.0 g/dL   RDW 14.4 11.5 - 15.5 %   Platelets 291 150 - 400 K/uL  Basic metabolic panel     Status: Abnormal   Collection Time: 05/20/14  2:54 AM  Result Value Ref Range   Sodium 135 135 - 145 mmol/L    Comment: Please note change in reference range.   Potassium 3.0 (L) 3.5 - 5.1 mmol/L    Comment: Please note change in reference range.   Chloride 102 96 - 112 mEq/L   CO2 27 19 - 32 mmol/L   Glucose, Bld 108 (H) 70 - 99 mg/dL   BUN <5 (L) 6 - 23 mg/dL   Creatinine, Ser 0.69 0.50 - 1.10 mg/dL   Calcium 8.2 (L) 8.4 - 10.5 mg/dL   GFR calc non Af Amer >90 >90 mL/min   GFR calc Af Amer >90 >90 mL/min    Comment: (NOTE) The eGFR has been calculated using the CKD EPI equation. This calculation has not been validated in all clinical situations. eGFR's persistently <90 mL/min signify possible Chronic Kidney Disease.    Anion gap 6 5 - 15  CBG monitoring, ED     Status: Abnormal   Collection Time: 05/20/14  3:15 AM  Result Value Ref Range   Glucose-Capillary 111 (H) 70 - 99 mg/dL  Glucose, capillary     Status: Abnormal   Collection Time: 05/20/14  4:04 AM  Result Value Ref Range   Glucose-Capillary 106 (H) 70 - 99 mg/dL    Ct Abdomen Pelvis W Contrast  05/19/2014   CLINICAL DATA:  Initial evaluation for acute abdominal pain.  EXAM: CT ABDOMEN AND PELVIS WITH CONTRAST  TECHNIQUE: Multidetector CT imaging of the abdomen and pelvis was performed using the standard protocol following bolus administration of intravenous contrast.  CONTRAST:  151m OMNIPAQUE IOHEXOL 300 MG/ML SOLN, 276mOMNIPAQUE IOHEXOL 300 MG/ML SOLN  COMPARISON:  Prior study from 04/05/2014.  FINDINGS: The visualized lung bases are clear. No pleural or pericardial effusion.   Subcentimeter hypodensity within the left hepatic lobe noted. This is too small the characterize by CT, but may reflect a small cysts, and is stable from prior study. The liver is otherwise unremarkable. Gallbladder within normal limits. No biliary dilatation. The spleen, adrenal glands, and pancreas demonstrate a normal contrast enhanced appearance.  Kidneys are equal size with symmetric enhancement. No nephrolithiasis, hydronephrosis, or focal enhancing renal mass.  Small hiatal hernia noted. Stomach otherwise unremarkable. Sequela of prior ileocolic resection with primary reanastomosis again seen.  There is a cluster of markedly inflamed loops of small bowel within the right mid abdomen closely approximated to the surgical anastomosis. There is a somewhat ill-defined hypodense fluid collection 5.3 x 3.5 x 3.6 cm within this region (series 201, image 51). Additional hypodense collection located more anteriorly and medially measures approximately 1.7 x 2.3 x 3.2 cm (series 2001, image 51). These 2 collections may be contiguous with one another. While these may reflect on opacified loops of bowel, the irregular peripheral enhancement about these collections raises the possibility of intra-abdominal abscesses. Overall, the degree of inflammation and phlegmonous change has worsened as compared to most recent CT from 04/05/2014.  Remainder of the bowel is unremarkable without evidence of obstruction or inflammation.  Bladder within normal limits.  Uterus and ovaries are absent.  No free air. Small volume free fluid present within the pelvis, which is likely reactive in nature.  Scattered mesenteric  lymph nodes are similar relative to prior study. No pathologic Lee enlarged intra-abdominal pelvic lymph nodes identified.  Moderate aorto bi-iliac atherosclerotic disease present. No aneurysm.  No acute osseous abnormality. No worrisome lytic or blastic osseous lesions.  IMPRESSION: Multiple clustered inflamed loops of  bowel within the right abdomen, likely related to known history of Crohn's disease. Several hypodense collections impregnated within this area of inflammation are again seen, the largest of which measures approximately 5.3 x 3.5 x 3.6 cm. While these may reflect unopacified loops of bowel, findings are again suspicious for possible associated intra-abdominal abscesses. Overall, the degree of inflammation and phlegmonous change has slightly worsened relative to most recent CT from 04/05/2014. No free air or associated obstruction.   Electronically Signed   By: Jeannine Boga M.D.   On: 05/19/2014 23:35    Review of Systems  Constitutional: Positive for malaise/fatigue.       +anorexia  Eyes: Negative.   Respiratory: Negative.   Cardiovascular: Negative.   Gastrointestinal: Positive for abdominal pain and constipation. Negative for nausea, vomiting, diarrhea, blood in stool and melena.  Genitourinary: Negative.   Musculoskeletal: Negative.   Skin: Negative.   Neurological: Positive for headaches. Negative for focal weakness.   Blood pressure 105/53, pulse 85, temperature 99.6 F (37.6 C), temperature source Oral, resp. rate 20, height 5' 2"  (1.575 m), weight 100.7 kg (222 lb 0.1 oz), last menstrual period 05/14/1988, SpO2 96 %. Physical Exam  Nursing note and vitals reviewed. Constitutional: She is oriented to person, place, and time. She appears well-developed and well-nourished. No distress.  HENT:  Head: Normocephalic and atraumatic.  Eyes: Conjunctivae are normal. No scleral icterus.  Neck: Normal range of motion. Neck supple.  Cardiovascular: Normal rate, regular rhythm and normal heart sounds.   Respiratory: Effort normal and breath sounds normal. No respiratory distress. She has no wheezes.  GI: Soft. Normal appearance. She exhibits no distension. Bowel sounds are decreased. There is tenderness in the right lower quadrant, periumbilical area, suprapubic area and left lower  quadrant. There is no rigidity, no rebound, no guarding and no CVA tenderness.  Musculoskeletal: Normal range of motion.  Neurological: She is alert and oriented to person, place, and time.  Skin: Skin is warm and dry.  Psychiatric: She has a normal mood and affect. Her behavior is normal.    Assessment/Plan: Principal Problem:  1.Crohn's disease of small intestine with enteritis and associated intraabdominal abscess. Currently NPO on IVF with IV Zosyn. Leukocytosis improving. WBC count now 15.4 down from 21K, however, patient febrile overnight. Case discussed with IR staff and abscess felt to be intramural and not amenable to percutaneous drainage. Recommendations were to continue with conservative management/clinical observation for improvement. Patient currently without clinical signs of peritonitis and vital signs and lactate level do not suggest sepsis. Currently without indication for urgent surgical intervention. Will continue to monitor closely and discuss above with Dr. Georgette Dover today.  Active Problems:  HYPERTENSION, BENIGN ESSENTIAL: anti-HTN meds currently on hold per IM  DM (diabetes mellitus): not on any DM meds,  but appears well controlled at this point. Last CBG 111.    Lahoma Rocker, [facility] Pager [phone] 05/20/2014, 9:19 AM

## 2014-05-20 NOTE — ED Notes (Signed)
Admitting MD at BS.  

## 2014-05-20 NOTE — ED Notes (Signed)
Paged GI/Prytle to Dr. Alcario Drought [phone]

## 2014-05-20 NOTE — ED Notes (Signed)
CBG 111 

## 2014-05-20 NOTE — ED Notes (Signed)
Paged April May X 2

## 2014-05-20 NOTE — H&P (Signed)
[facility] History and Physical  LATRECE NITTA YHC:[id number] DOB: Oct 01, 1955 DOA: 05/19/2014  Referring physician: EDP PCP: Scarlette Calico, MD   Chief Complaint: Abdominal pain   HPI: April May is a 59 y.o. female with h/o crohn's disease presents to the ED with c/o abdominal pain in her entire abdomen.  Symptoms are similar to presentation last month but worse this time.  Pain is constant, 7/10 in intensity, onset at 8:30 this morning.  Last month she has abdominal abscess from crohn's was treated medically with ABx, discharged on cipro/flagyl and prednisone taper (prednisone dose is down to 21m daily currently).  Cipro flagyl was continued on discharge for 2 weeks total of ABx.  Review of Systems: Systems reviewed.  As above, otherwise negative  Past Medical History  Diagnosis Date  . Allergy   . Depression   . Hypertension   . Crohn disease   . Diabetes mellitus type II    Past Surgical History  Procedure Laterality Date  . Appendectomy    . Abdominal hysterectomy    . Small intestine surgery    . Breast surgery     Social History:  reports that she has been smoking Cigarettes.  She has a 35 pack-year smoking history. She has never used smokeless tobacco. She reports that she does not drink alcohol or use illicit drugs.  Allergies  Allergen Reactions  . Lisinopril     cough  . Crestor [Rosuvastatin Calcium]     Muscle and joint aches  . Lipitor [Atorvastatin Calcium]     Muscle cramps  . Azathioprine Other (See Comments)    Side effects to pancrease       Family History  Problem Relation Age of Onset  . Hypertension Mother   . Arthritis Father   . Alcohol abuse Paternal Uncle   . Diabetes Paternal Grandmother   . Stroke Paternal Grandmother      Prior to Admission medications   Medication Sig Start Date End Date Taking? Authorizing Provider  AMITIZA 24 MCG capsule Take 24 mcg by mouth at bedtime. 03/19/14  Yes Historical Provider, MD   Azilsartan-Chlorthalidone (EDARBYCLOR) 40-25 MG TABS TAKE 1 TABLET BY MOUTH DAILY. 01/12/14  Yes WHendricks Limes MD  buPROPion ([facility]) 100 MG 12 hr tablet Take 100 mg by mouth at bedtime.   Yes Historical Provider, MD  fluticasone (FLONASE) 50 MCG/ACT nasal spray Place 2 sprays into both nostrils daily as needed for allergies or rhinitis. 09/14/13  Yes TJanith Lima MD  lamoTRIgine (LAMICTAL) 200 MG tablet Take 200 mg by mouth at bedtime.   Yes Historical Provider, MD  predniSONE (DELTASONE) 5 MG tablet Take 5 mg by mouth daily with breakfast.   Yes Historical Provider, MD  predniSONE (STERAPRED UNI-PAK) 5 MG TABS tablet Take by mouth.   Yes Historical Provider, MD   Physical Exam: Filed Vitals:   05/20/14 0230  BP: 102/52  Pulse: 82  Temp:   Resp:     BP 102/52 mmHg  Pulse 82  Temp(Src) 98.8 F (37.1 C) (Oral)  Resp 18  SpO2 96%  LMP 05/14/1988  General Appearance:    Alert, oriented, no distress, appears stated age  Head:    Normocephalic, atraumatic  Eyes:    PERRL, EOMI, sclera non-icteric        Nose:   Nares without drainage or epistaxis. Mucosa, turbinates normal  Throat:   Moist mucous membranes. Oropharynx without erythema or exudate.  Neck:   Supple. No  carotid bruits.  No thyromegaly.  No lymphadenopathy.   Back:     No CVA tenderness, no spinal tenderness  Lungs:     Clear to auscultation bilaterally, without wheezes, rhonchi or rales  Chest wall:    No tenderness to palpitation  Heart:    Regular rate and rhythm without murmurs, gallops, rubs  Abdomen:     Soft, Diffuse tenderness, nondistended, normal bowel sounds, no organomegaly  Genitalia:    deferred  Rectal:    deferred  Extremities:   No clubbing, cyanosis or edema.  Pulses:   2+ and symmetric all extremities  Skin:   Skin color, texture, turgor normal, no rashes or lesions  Lymph nodes:   Cervical, supraclavicular, and axillary nodes normal  Neurologic:   CNII-XII intact. Normal strength,  sensation and reflexes      throughout    Labs on Admission:  Basic Metabolic Panel:  Recent Labs Lab 05/19/14 1816  NA 136  K 3.6  CL 102  CO2 21  GLUCOSE 95  BUN 7  CREATININE 0.77  CALCIUM 9.3   Liver Function Tests:  Recent Labs Lab 05/19/14 1816  AST 20  ALT 16  ALKPHOS 60  BILITOT 0.7  PROT 7.2  ALBUMIN 3.5    Recent Labs Lab 05/19/14 1816  LIPASE 32   No results for input(s): AMMONIA in the last 168 hours. CBC:  Recent Labs Lab 05/19/14 1816  WBC 21.8*  NEUTROABS 15.9*  HGB 13.9  HCT 42.4  MCV 93.0  PLT 333   Cardiac Enzymes: No results for input(s): CKTOTAL, CKMB, CKMBINDEX, TROPONINI in the last 168 hours.  BNP (last 3 results) No results for input(s): PROBNP in the last 8760 hours. CBG: No results for input(s): GLUCAP in the last 168 hours.  Radiological Exams on Admission: Ct Abdomen Pelvis W Contrast  05/19/2014   CLINICAL DATA:  Initial evaluation for acute abdominal pain.  EXAM: CT ABDOMEN AND PELVIS WITH CONTRAST  TECHNIQUE: Multidetector CT imaging of the abdomen and pelvis was performed using the standard protocol following bolus administration of intravenous contrast.  CONTRAST:  187m OMNIPAQUE IOHEXOL 300 MG/ML SOLN, 2102mOMNIPAQUE IOHEXOL 300 MG/ML SOLN  COMPARISON:  Prior study from 04/05/2014.  FINDINGS: The visualized lung bases are clear. No pleural or pericardial effusion.  Subcentimeter hypodensity within the left hepatic lobe noted. This is too small the characterize by CT, but may reflect a small cysts, and is stable from prior study. The liver is otherwise unremarkable. Gallbladder within normal limits. No biliary dilatation. The spleen, adrenal glands, and pancreas demonstrate a normal contrast enhanced appearance.  Kidneys are equal size with symmetric enhancement. No nephrolithiasis, hydronephrosis, or focal enhancing renal mass.  Small hiatal hernia noted. Stomach otherwise unremarkable. Sequela of prior ileocolic resection  with primary reanastomosis again seen.  There is a cluster of markedly inflamed loops of small bowel within the right mid abdomen closely approximated to the surgical anastomosis. There is a somewhat ill-defined hypodense fluid collection 5.3 x 3.5 x 3.6 cm within this region (series 201, image 51). Additional hypodense collection located more anteriorly and medially measures approximately 1.7 x 2.3 x 3.2 cm (series 2001, image 51). These 2 collections may be contiguous with one another. While these may reflect on opacified loops of bowel, the irregular peripheral enhancement about these collections raises the possibility of intra-abdominal abscesses. Overall, the degree of inflammation and phlegmonous change has worsened as compared to most recent CT from 04/05/2014.  Remainder of the bowel  is unremarkable without evidence of obstruction or inflammation.  Bladder within normal limits.  Uterus and ovaries are absent.  No free air. Small volume free fluid present within the pelvis, which is likely reactive in nature.  Scattered mesenteric lymph nodes are similar relative to prior study. No pathologic Lee enlarged intra-abdominal pelvic lymph nodes identified.  Moderate aorto bi-iliac atherosclerotic disease present. No aneurysm.  No acute osseous abnormality. No worrisome lytic or blastic osseous lesions.  IMPRESSION: Multiple clustered inflamed loops of bowel within the right abdomen, likely related to known history of Crohn's disease. Several hypodense collections impregnated within this area of inflammation are again seen, the largest of which measures approximately 5.3 x 3.5 x 3.6 cm. While these may reflect unopacified loops of bowel, findings are again suspicious for possible associated intra-abdominal abscesses. Overall, the degree of inflammation and phlegmonous change has slightly worsened relative to most recent CT from 04/05/2014. No free air or associated obstruction.   Electronically Signed   By:  Jeannine Boga M.D.   On: 05/19/2014 23:35    EKG: Independently reviewed.  Assessment/Plan Principal Problem:   Crohn's disease of small intestine with abscess Active Problems:   HYPERTENSION, BENIGN ESSENTIAL   DM (diabetes mellitus), type 2, uncontrolled   1. Crohn's enteritis with abscess and sepsis- 1. Spoke with Dr. Collene Mares, he feels that it is likely that patient will need some form of drainage. 2. Zosyn 3. Spoke with Dr. Hulen Skains who will see patient. 4. IVF 5. Tylenol for fever 6. Trend WBC 7. Continue current dose of prednisone 53m daily. 2. HTN - holding HTN meds 3. DM2 - not on any DM meds, will monitor BGL but appears well controlled at this point at 100.    Code Status: Full Code  Family Communication: No family in room Disposition Plan: Admit to inpatient   Time spent: 70 min  Micco Bourbeau M. [facility] Pager [phone] If 7AM-7PM, please contact the day team taking care of the patient [email or web address] Password [email or web address]/11/2014, 2:47 AM

## 2014-05-20 NOTE — Progress Notes (Signed)
ANTIBIOTIC CONSULT NOTE - INITIAL  Pharmacy Consult for Zosyn  Indication: Intra-abdominal infection   Allergies  Allergen Reactions  . Lisinopril     cough  . Crestor [Rosuvastatin Calcium]     Muscle and joint aches  . Lipitor [Atorvastatin Calcium]     Muscle cramps  . Azathioprine Other (See Comments)    Side effects to pancrease       Vital Signs: Temp: 98.8 F (37.1 C) (01/07 0008) Temp Source: Oral (01/07 0008) BP: 109/53 mmHg (01/06 2330) Pulse Rate: 75 (01/06 2345) Intake/Output from previous day: 01/06 0701 - 01/07 0700 In: 1000 [I.V.:1000] Out: -  Intake/Output from this shift: Total I/O In: 1000 [I.V.:1000] Out: -   Labs:  Recent Labs  05/19/14 1816  WBC 21.8*  HGB 13.9  PLT 333  CREATININE 0.77    Medical History: Past Medical History  Diagnosis Date  . Allergy   . Depression   . Hypertension   . Crohn disease   . Diabetes mellitus type II     Assessment: Zosyn for intra-abdominal infection. Pt is febrile, WBC elevated, pt with known Crohn's disease, CT with increase in inflammation.   Plan:  -Zosyn 3.375G IV q8h to be infused over 4 hours -Flagyl per MD, can likely DC (duplicate coverage) -Trend WBC, temp, renal function  Narda Bonds 05/20/2014,12:22 AM

## 2014-05-20 NOTE — Progress Notes (Signed)
PROGRESS NOTE    April May QHU:[id number] DOB: [birth year] DOA: 05/19/2014 PCP: Scarlette Calico, MD  HPI/Brief narrative 59 year old female patient with history of Crohn's disease, recently discharged from the hospital for intra-abdominal abscess from her Crohn's disease Thanksgiving 2015, HTN, DM 2, was supposed to have a follow-up CT abdomen today, presented with acute onset of abdominal pain and CT abdomen demonstrates worsening of her abscesses and inflammation. GI and [facility] have consulted.  Assessment/Plan:  1. Intra-abdominal abscesses: Persistent versus recurrent, secondary to her underlying Crohn's disease. Discussed with GI, CCS and IR: Her abscesses are not drainable as they are intramural. Supportive treatment with bowel rest (clear liquids) and IV Zosyn. Monitor closely clinically. 2. Crohn's flare: GI indicates that she needs to be on a biologic medication as she has clearly demonstrated persistent versus recurrent inflammation during the steroid taper. GI starting her on Humira. Continue IV Solu-Medrol 40 mg every 12 hours 3. Hypertension: Soft blood pressures. Antihypertensives currently on hold. Monitor. 4. Diet controlled DM 2: Follow CBGs and consider SSI if rising. 5. Hypokalemia: Replace IV and in fluids and follow BMP in a.m. 6. Leukocytosis: improving.   Code Status: Full Family Communication: None at bedside Disposition Plan: Home when medically stable   Consultants:  Gastroenterology  General surgery  Interventional radiology  Procedures:  None  Antibiotics:  IV Zosyn   Subjective: Abdominal pain slightly better and controlled with pain medications. Usually constipated and used Amitiza night before last and had a small BM yesterday. Passing flatus. No nausea or vomiting.  Objective: Filed Vitals:   05/20/14 0230 05/20/14 0316 05/20/14 0351 05/20/14 1051  BP: 102/52 104/48 105/53 100/57  Pulse: 82  85 20  Temp:   99.6 F (37.6 C)     TempSrc:   Oral   Resp:  20 20   Height:   5' 2"  (1.575 m)   Weight:   100.7 kg (222 lb 0.1 oz)   SpO2: 96%  96%     Intake/Output Summary (Last 24 hours) at 05/20/14 1339 Last data filed at 05/20/14 1231  Gross per 24 hour  Intake 1485.42 ml  Output    775 ml  Net 710.42 ml   Filed Weights   05/20/14 0351  Weight: 100.7 kg (222 lb 0.1 oz)     Exam:  General exam: Pleasant April female lying comfortably in bed. Morbidly obese. Respiratory system: Clear. No increased work of breathing. Cardiovascular system: S1 & S2 heard, RRR. No JVD, murmurs, gallops, clicks or pedal edema. Gastrointestinal system: Abdomen is nondistended, soft. Diffuse mild tenderness without acute peritoneal signs. Normal bowel sounds heard. Central nervous system: Alert and oriented. No focal neurological deficits. Extremities: Symmetric 5 x 5 power.   Data Reviewed: Basic Metabolic Panel:  Recent Labs Lab 05/19/14 1816 05/20/14 0249 05/20/14 0254  NA 136  --  135  K 3.6  --  3.0*  CL 102  --  102  CO2 21  --  27  GLUCOSE 95  --  108*  BUN 7  --  <5*  CREATININE 0.77  --  0.69  CALCIUM 9.3  --  8.2*  MG  --  1.6  --    Liver Function Tests:  Recent Labs Lab 05/19/14 1816  AST 20  ALT 16  ALKPHOS 60  BILITOT 0.7  PROT 7.2  ALBUMIN 3.5    Recent Labs Lab 05/19/14 1816  LIPASE 32   No results for input(s): AMMONIA in the last 168 hours.  CBC:  Recent Labs Lab 05/19/14 1816 05/20/14 0254  WBC 21.8* 15.4*  NEUTROABS 15.9*  --   HGB 13.9 12.3  HCT 42.4 37.7  MCV 93.0 93.1  PLT 333 291   Cardiac Enzymes: No results for input(s): CKTOTAL, CKMB, CKMBINDEX, TROPONINI in the last 168 hours. BNP (last 3 results) No results for input(s): PROBNP in the last 8760 hours. CBG:  Recent Labs Lab 05/20/14 0315 05/20/14 0404 05/20/14 1139  GLUCAP 111* 106* 98    No results found for this or any previous visit (from the past 240 hour(s)).         Studies: Ct  Abdomen Pelvis W Contrast  05/19/2014   CLINICAL DATA:  Initial evaluation for acute abdominal pain.  EXAM: CT ABDOMEN AND PELVIS WITH CONTRAST  TECHNIQUE: Multidetector CT imaging of the abdomen and pelvis was performed using the standard protocol following bolus administration of intravenous contrast.  CONTRAST:  187m OMNIPAQUE IOHEXOL 300 MG/ML SOLN, 244mOMNIPAQUE IOHEXOL 300 MG/ML SOLN  COMPARISON:  Prior study from 04/05/2014.  FINDINGS: The visualized lung bases are clear. No pleural or pericardial effusion.  Subcentimeter hypodensity within the left hepatic lobe noted. This is too small the characterize by CT, but may reflect a small cysts, and is stable from prior study. The liver is otherwise unremarkable. Gallbladder within normal limits. No biliary dilatation. The spleen, adrenal glands, and pancreas demonstrate a normal contrast enhanced appearance.  Kidneys are equal size with symmetric enhancement. No nephrolithiasis, hydronephrosis, or focal enhancing renal mass.  Small hiatal hernia noted. Stomach otherwise unremarkable. Sequela of prior ileocolic resection with primary reanastomosis again seen.  There is a cluster of markedly inflamed loops of small bowel within the right mid abdomen closely approximated to the surgical anastomosis. There is a somewhat ill-defined hypodense fluid collection 5.3 x 3.5 x 3.6 cm within this region (series 201, image 51). Additional hypodense collection located more anteriorly and medially measures approximately 1.7 x 2.3 x 3.2 cm (series 2001, image 51). These 2 collections may be contiguous with one another. While these may reflect on opacified loops of bowel, the irregular peripheral enhancement about these collections raises the possibility of intra-abdominal abscesses. Overall, the degree of inflammation and phlegmonous change has worsened as compared to most recent CT from 04/05/2014.  Remainder of the bowel is unremarkable without evidence of obstruction or  inflammation.  Bladder within normal limits.  Uterus and ovaries are absent.  No free air. Small volume free fluid present within the pelvis, which is likely reactive in nature.  Scattered mesenteric lymph nodes are similar relative to prior study. No pathologic Lee enlarged intra-abdominal pelvic lymph nodes identified.  Moderate aorto bi-iliac atherosclerotic disease present. No aneurysm.  No acute osseous abnormality. No worrisome lytic or blastic osseous lesions.  IMPRESSION: Multiple clustered inflamed loops of bowel within the right abdomen, likely related to known history of Crohn's disease. Several hypodense collections impregnated within this area of inflammation are again seen, the largest of which measures approximately 5.3 x 3.5 x 3.6 cm. While these may reflect unopacified loops of bowel, findings are again suspicious for possible associated intra-abdominal abscesses. Overall, the degree of inflammation and phlegmonous change has slightly worsened relative to most recent CT from 04/05/2014. No free air or associated obstruction.   Electronically Signed   By: BeJeannine Boga.D.   On: 05/19/2014 23:35        Scheduled Meds: . buPROPion  100 mg Oral QHS  . lamoTRIgine  200 mg Oral  QHS  . methylPREDNISolone (SOLU-MEDROL) injection  40 mg Intravenous Q12H  . piperacillin-tazobactam (ZOSYN)  IV  3.375 g Intravenous 3 times per day  . sodium chloride  3 mL Intravenous Q12H   Continuous Infusions: . 0.9 % NaCl with KCl 40 mEq / L 125 mL/hr (05/20/14 1052)    Principal Problem:   Crohn's disease of small intestine with abscess Active Problems:   HYPERTENSION, BENIGN ESSENTIAL   DM (diabetes mellitus), type 2, uncontrolled    Time spent: 35 minutes.    Vernell Leep, MD, FACP, FHM. [facility] Pager [phone]  If 7PM-7AM, please contact night-coverage [email or web address] Password [email or web address] 05/20/2014, 1:39 PM    LOS: 1 day

## 2014-05-20 NOTE — Progress Notes (Signed)
UR Completed.  [phone]  

## 2014-05-20 NOTE — ED Notes (Signed)
Paged Dr. Benson Norway to Dr. Alcario Drought

## 2014-05-20 NOTE — ED Notes (Signed)
Admitting MD informed flagyl already infusing as it was ordered, OK to finish infusion at this time per Dr. Alcario Drought. Further flagyl orders discontinued.

## 2014-05-20 NOTE — Consult Note (Signed)
Reason for Consult: Crohn's disease Referring Physician: [facility]  Rosaura Carpenter HPI: This is a 59 year old female who is well-known to me for her history of Crohn's disease.  She was recentlhy discharged from the hospital for an intra-abdominal abscess from her Crohn's disease Thanksgiving 2015.  She responded very well to the use of steroids and antibiotics and during a recent phone conversation she was well.  No complaints of any abdominal pain.  In fact, she was supposed to have a follow up CT scan today, but yesterday evening she developed an acute onset of abdominal pain.  As a result of her symptoms she represented to the hospital and the repeat CT scan demonstrates a worsening of her abscesses and inflammation.  In the past she was tried on azathioprine, but she developed pancreatitis.  I had a long discussion with her during the last admission with the use of Humira, but she was reluctant to start on the medication.  Past Medical History  Diagnosis Date  . Allergy   . Depression   . Hypertension   . Crohn disease   . Diabetes mellitus type II     Past Surgical History  Procedure Laterality Date  . Appendectomy    . Abdominal hysterectomy    . Small intestine surgery    . Breast surgery      Family History  Problem Relation Age of Onset  . Hypertension Mother   . Arthritis Father   . Alcohol abuse Paternal Uncle   . Diabetes Paternal Grandmother   . Stroke Paternal Grandmother     Social History:  reports that she has been smoking Cigarettes.  She has a 35 pack-year smoking history. She has never used smokeless tobacco. She reports that she does not drink alcohol or use illicit drugs.  Allergies:  Allergies  Allergen Reactions  . Lisinopril     cough  . Crestor [Rosuvastatin Calcium]     Muscle and joint aches  . Lipitor [Atorvastatin Calcium]     Muscle cramps  . Azathioprine Other (See Comments)    Side effects to pancrease       Medications:   Scheduled: . buPROPion  100 mg Oral QHS  . lamoTRIgine  200 mg Oral QHS  . piperacillin-tazobactam (ZOSYN)  IV  3.375 g Intravenous 3 times per day  . predniSONE  5 mg Oral Q breakfast  . sodium chloride  3 mL Intravenous Q12H   Continuous: . 0.9 % NaCl with KCl 40 mEq / L 125 mL/hr (05/20/14 1052)    Results for orders placed or performed during the hospital encounter of 05/19/14 (from the past 24 hour(s))  CBC with Differential     Status: Abnormal   Collection Time: 05/19/14  6:16 PM  Result Value Ref Range   WBC 21.8 (H) 4.0 - 10.5 K/uL   RBC 4.56 3.87 - 5.11 MIL/uL   Hemoglobin 13.9 12.0 - 15.0 g/dL   HCT 42.4 36.0 - 46.0 %   MCV 93.0 78.0 - 100.0 fL   MCH 30.5 26.0 - 34.0 pg   MCHC 32.8 30.0 - 36.0 g/dL   RDW 14.3 11.5 - 15.5 %   Platelets 333 150 - 400 K/uL   Neutrophils Relative % 73 43 - 77 %   Neutro Abs 15.9 (H) 1.7 - 7.7 K/uL   Lymphocytes Relative 15 12 - 46 %   Lymphs Abs 3.3 0.7 - 4.0 K/uL   Monocytes Relative 12 3 - 12 %  Monocytes Absolute 2.5 (H) 0.1 - 1.0 K/uL   Eosinophils Relative 0 0 - 5 %   Eosinophils Absolute 0.1 0.0 - 0.7 K/uL   Basophils Relative 0 0 - 1 %   Basophils Absolute 0.1 0.0 - 0.1 K/uL  Comprehensive metabolic panel     Status: None   Collection Time: 05/19/14  6:16 PM  Result Value Ref Range   Sodium 136 135 - 145 mmol/L   Potassium 3.6 3.5 - 5.1 mmol/L   Chloride 102 96 - 112 mEq/L   CO2 21 19 - 32 mmol/L   Glucose, Bld 95 70 - 99 mg/dL   BUN 7 6 - 23 mg/dL   Creatinine, Ser 0.77 0.50 - 1.10 mg/dL   Calcium 9.3 8.4 - 10.5 mg/dL   Total Protein 7.2 6.0 - 8.3 g/dL   Albumin 3.5 3.5 - 5.2 g/dL   AST 20 0 - 37 U/L   ALT 16 0 - 35 U/L   Alkaline Phosphatase 60 39 - 117 U/L   Total Bilirubin 0.7 0.3 - 1.2 mg/dL   GFR calc non Af Amer >90 >90 mL/min   GFR calc Af Amer >90 >90 mL/min   Anion gap 13 5 - 15  Lipase, blood     Status: None   Collection Time: 05/19/14  6:16 PM  Result Value Ref Range   Lipase 32 11 - 59 U/L   I-Stat CG4 Lactic Acid, ED     Status: None   Collection Time: 05/19/14  6:38 PM  Result Value Ref Range   Lactic Acid, Venous 1.54 0.5 - 2.2 mmol/L  Urinalysis, Routine w reflex microscopic     Status: None   Collection Time: 05/19/14  9:14 PM  Result Value Ref Range   Color, Urine YELLOW YELLOW   APPearance CLEAR CLEAR   Specific Gravity, Urine 1.012 1.005 - 1.030   pH 5.5 5.0 - 8.0   Glucose, UA NEGATIVE NEGATIVE mg/dL   Hgb urine dipstick NEGATIVE NEGATIVE   Bilirubin Urine NEGATIVE NEGATIVE   Ketones, ur NEGATIVE NEGATIVE mg/dL   Protein, ur NEGATIVE NEGATIVE mg/dL   Urobilinogen, UA 0.2 0.0 - 1.0 mg/dL   Nitrite NEGATIVE NEGATIVE   Leukocytes, UA NEGATIVE NEGATIVE  Magnesium     Status: None   Collection Time: 05/20/14  2:49 AM  Result Value Ref Range   Magnesium 1.6 1.5 - 2.5 mg/dL  CBC     Status: Abnormal   Collection Time: 05/20/14  2:54 AM  Result Value Ref Range   WBC 15.4 (H) 4.0 - 10.5 K/uL   RBC 4.05 3.87 - 5.11 MIL/uL   Hemoglobin 12.3 12.0 - 15.0 g/dL   HCT 37.7 36.0 - 46.0 %   MCV 93.1 78.0 - 100.0 fL   MCH 30.4 26.0 - 34.0 pg   MCHC 32.6 30.0 - 36.0 g/dL   RDW 14.4 11.5 - 15.5 %   Platelets 291 150 - 400 K/uL  Basic metabolic panel     Status: Abnormal   Collection Time: 05/20/14  2:54 AM  Result Value Ref Range   Sodium 135 135 - 145 mmol/L   Potassium 3.0 (L) 3.5 - 5.1 mmol/L   Chloride 102 96 - 112 mEq/L   CO2 27 19 - 32 mmol/L   Glucose, Bld 108 (H) 70 - 99 mg/dL   BUN <5 (L) 6 - 23 mg/dL   Creatinine, Ser 0.69 0.50 - 1.10 mg/dL   Calcium 8.2 (L) 8.4 - 10.5 mg/dL  GFR calc non Af Amer >90 >90 mL/min   GFR calc Af Amer >90 >90 mL/min   Anion gap 6 5 - 15  CBG monitoring, ED     Status: Abnormal   Collection Time: 05/20/14  3:15 AM  Result Value Ref Range   Glucose-Capillary 111 (H) 70 - 99 mg/dL  Glucose, capillary     Status: Abnormal   Collection Time: 05/20/14  4:04 AM  Result Value Ref Range   Glucose-Capillary 106 (H) 70 - 99 mg/dL   Glucose, capillary     Status: None   Collection Time: 05/20/14 11:39 AM  Result Value Ref Range   Glucose-Capillary 98 70 - 99 mg/dL   Comment 1 Notify RN      Ct Abdomen Pelvis W Contrast  05/19/2014   CLINICAL DATA:  Initial evaluation for acute abdominal pain.  EXAM: CT ABDOMEN AND PELVIS WITH CONTRAST  TECHNIQUE: Multidetector CT imaging of the abdomen and pelvis was performed using the standard protocol following bolus administration of intravenous contrast.  CONTRAST:  164m OMNIPAQUE IOHEXOL 300 MG/ML SOLN, 278mOMNIPAQUE IOHEXOL 300 MG/ML SOLN  COMPARISON:  Prior study from 04/05/2014.  FINDINGS: The visualized lung bases are clear. No pleural or pericardial effusion.  Subcentimeter hypodensity within the left hepatic lobe noted. This is too small the characterize by CT, but may reflect a small cysts, and is stable from prior study. The liver is otherwise unremarkable. Gallbladder within normal limits. No biliary dilatation. The spleen, adrenal glands, and pancreas demonstrate a normal contrast enhanced appearance.  Kidneys are equal size with symmetric enhancement. No nephrolithiasis, hydronephrosis, or focal enhancing renal mass.  Small hiatal hernia noted. Stomach otherwise unremarkable. Sequela of prior ileocolic resection with primary reanastomosis again seen.  There is a cluster of markedly inflamed loops of small bowel within the right mid abdomen closely approximated to the surgical anastomosis. There is a somewhat ill-defined hypodense fluid collection 5.3 x 3.5 x 3.6 cm within this region (series 201, image 51). Additional hypodense collection located more anteriorly and medially measures approximately 1.7 x 2.3 x 3.2 cm (series 2001, image 51). These 2 collections may be contiguous with one another. While these may reflect on opacified loops of bowel, the irregular peripheral enhancement about these collections raises the possibility of intra-abdominal abscesses. Overall, the degree of  inflammation and phlegmonous change has worsened as compared to most recent CT from 04/05/2014.  Remainder of the bowel is unremarkable without evidence of obstruction or inflammation.  Bladder within normal limits.  Uterus and ovaries are absent.  No free air. Small volume free fluid present within the pelvis, which is likely reactive in nature.  Scattered mesenteric lymph nodes are similar relative to prior study. No pathologic Lee enlarged intra-abdominal pelvic lymph nodes identified.  Moderate aorto bi-iliac atherosclerotic disease present. No aneurysm.  No acute osseous abnormality. No worrisome lytic or blastic osseous lesions.  IMPRESSION: Multiple clustered inflamed loops of bowel within the right abdomen, likely related to known history of Crohn's disease. Several hypodense collections impregnated within this area of inflammation are again seen, the largest of which measures approximately 5.3 x 3.5 x 3.6 cm. While these may reflect unopacified loops of bowel, findings are again suspicious for possible associated intra-abdominal abscesses. Overall, the degree of inflammation and phlegmonous change has slightly worsened relative to most recent CT from 04/05/2014. No free air or associated obstruction.   Electronically Signed   By: BeJeannine Boga.D.   On: 05/19/2014 23:35  ROS:  As stated above in the HPI otherwise negative.  Blood pressure 100/57, pulse 20, temperature 99.6 F (37.6 C), temperature source Oral, resp. rate 20, height 5' 2"  (1.575 m), weight 100.7 kg (222 lb 0.1 oz), last menstrual period 05/14/1988, SpO2 96 %.    PE: Gen: NAD, Alert and Oriented HEENT:  [place]/AT, EOMI Neck: Supple, no LAD Lungs: CTA Bilaterally CV: RRR without M/G/R ABM: Soft, mild diffuse tenderness, +BS Ext: No C/C/E  Assessment/Plan: 1) Crohn's Flare. 2) Intra-abdominal abscesses.   She needs to be on a biologic medication as she has clearly demonstrated a persistence versus recurrence of her  inflammation during the steroid taper.  Her abscesses are not drainable per IR as they are intramural.  At this time she has consented to pursing treatment with a biologic and I will start her on Humira.  Plan: 1) Continue with Zosyn. 2) Increase steroids - Solumedrol 40 mg IV Q12 hours. 3) Check TB gold and HBV status in anticipation for Humira.  This medication will be started as an outpatient. 4) Clear liquid diet.  Kariss Longmire D 05/20/2014, 12:06 PM

## 2014-05-21 DIAGNOSIS — R109 Unspecified abdominal pain: Secondary | ICD-10-CM | POA: Insufficient documentation

## 2014-05-21 DIAGNOSIS — K651 Peritoneal abscess: Secondary | ICD-10-CM | POA: Insufficient documentation

## 2014-05-21 LAB — CBC
HCT: 39 % (ref 36.0–46.0)
Hemoglobin: 12.5 g/dL (ref 12.0–15.0)
MCH: 29.7 pg (ref 26.0–34.0)
MCHC: 32.1 g/dL (ref 30.0–36.0)
MCV: 92.6 fL (ref 78.0–100.0)
Platelets: 337 10*3/uL (ref 150–400)
RBC: 4.21 MIL/uL (ref 3.87–5.11)
RDW: 14.2 % (ref 11.5–15.5)
WBC: 12.7 10*3/uL — ABNORMAL HIGH (ref 4.0–10.5)

## 2014-05-21 LAB — BASIC METABOLIC PANEL
ANION GAP: 9 (ref 5–15)
BUN: 9 mg/dL (ref 6–23)
CO2: 22 mmol/L (ref 19–32)
Calcium: 8.5 mg/dL (ref 8.4–10.5)
Chloride: 105 mEq/L (ref 96–112)
Creatinine, Ser: 0.73 mg/dL (ref 0.50–1.10)
GFR calc Af Amer: >90 mL/min (ref >90)
GFR calc non Af Amer: >90 mL/min (ref >90)
GLUCOSE: 139 mg/dL — AB (ref 70–99)
POTASSIUM: 4.1 mmol/L (ref 3.5–5.1)
Sodium: 136 mmol/L (ref 135–145)

## 2014-05-21 LAB — GLUCOSE, CAPILLARY
GLUCOSE-CAPILLARY: 148 mg/dL — AB (ref 70–99)
GLUCOSE-CAPILLARY: 128 mg/dL — AB (ref 70–99)
Glucose-Capillary: 137 mg/dL — ABNORMAL HIGH (ref 70–99)
Glucose-Capillary: 142 mg/dL — ABNORMAL HIGH (ref 70–99)
Glucose-Capillary: 142 mg/dL — ABNORMAL HIGH (ref 70–99)
Glucose-Capillary: 150 mg/dL — ABNORMAL HIGH (ref 70–99)

## 2014-05-21 LAB — HEPATITIS B SURFACE ANTIGEN: HEP B S AG: NEGATIVE

## 2014-05-21 MED ORDER — ZOLPIDEM TARTRATE 5 MG PO TABS
5.0000 mg | ORAL_TABLET | Freq: Every evening | ORAL | Status: DC | PRN
  Administered 2014-05-22 – 2014-05-23 (×3): 5 mg via ORAL
  Filled 2014-05-21 (×3): qty 1

## 2014-05-21 MED ORDER — INSULIN ASPART 100 UNIT/ML ~~LOC~~ SOLN
0.0000 [IU] | Freq: Three times a day (TID) | SUBCUTANEOUS | Status: DC
  Administered 2014-05-21 – 2014-05-22 (×3): 1 [IU] via SUBCUTANEOUS
  Administered 2014-05-22: 2 [IU] via SUBCUTANEOUS

## 2014-05-21 NOTE — Progress Notes (Signed)
Subjective: Feeling much better.  No further abdominal pain.  Some anxiety with the Solumedrol dosing.  Objective: Vital signs in last 24 hours: Temp:  [97.8 F (36.6 C)-98.3 F (36.8 C)] 98 F (36.7 C) (01/08 0459) Pulse Rate:  [20-89] 73 (01/08 0459) Resp:  [18-20] 20 (01/08 0459) BP: (100-112)/(55-64) 106/64 mmHg (01/08 0459) SpO2:  [96 %-100 %] 100 % (01/08 0459) Weight:  [101.9 kg (224 lb 10.4 oz)] 101.9 kg (224 lb 10.4 oz) (01/08 0459) Last BM Date: 05/19/14  Intake/Output from previous day: 01/07 0701 - 01/08 0700 In: 3309.3 [P.O.:600; I.V.:2509.3; IV Piggyback:200] Out: 1150 [Urine:1150] Intake/Output this shift:    General appearance: alert and no distress GI: soft, non-tender; bowel sounds normal; no masses,  no organomegaly  Lab Results:  Recent Labs  05/19/14 1816 05/20/14 0254 05/21/14 0500  WBC 21.8* 15.4* 12.7*  HGB 13.9 12.3 12.5  HCT 42.4 37.7 39.0  PLT 333 291 337   BMET  Recent Labs  05/19/14 1816 05/20/14 0254 05/21/14 0500  NA 136 135 136  K 3.6 3.0* 4.1  CL 102 102 105  CO2 21 27 22   GLUCOSE 95 108* 139*  BUN 7 <5* 9  CREATININE 0.77 0.69 0.73  CALCIUM 9.3 8.2* 8.5   LFT  Recent Labs  05/19/14 1816  PROT 7.2  ALBUMIN 3.5  AST 20  ALT 16  ALKPHOS 60  BILITOT 0.7   PT/INR No results for input(s): LABPROT, INR in the last 72 hours. Hepatitis Panel  Recent Labs  05/20/14 1430  HEPBSAG NEGATIVE   C-Diff No results for input(s): CDIFFTOX in the last 72 hours. Fecal Lactopherrin No results for input(s): FECLLACTOFRN in the last 72 hours.  Studies/Results: Ct Abdomen Pelvis W Contrast  05/19/2014   CLINICAL DATA:  Initial evaluation for acute abdominal pain.  EXAM: CT ABDOMEN AND PELVIS WITH CONTRAST  TECHNIQUE: Multidetector CT imaging of the abdomen and pelvis was performed using the standard protocol following bolus administration of intravenous contrast.  CONTRAST:  151m OMNIPAQUE IOHEXOL 300 MG/ML SOLN, 214m OMNIPAQUE IOHEXOL 300 MG/ML SOLN  COMPARISON:  Prior study from 04/05/2014.  FINDINGS: The visualized lung bases are clear. No pleural or pericardial effusion.  Subcentimeter hypodensity within the left hepatic lobe noted. This is too small the characterize by CT, but may reflect a small cysts, and is stable from prior study. The liver is otherwise unremarkable. Gallbladder within normal limits. No biliary dilatation. The spleen, adrenal glands, and pancreas demonstrate a normal contrast enhanced appearance.  Kidneys are equal size with symmetric enhancement. No nephrolithiasis, hydronephrosis, or focal enhancing renal mass.  Small hiatal hernia noted. Stomach otherwise unremarkable. Sequela of prior ileocolic resection with primary reanastomosis again seen.  There is a cluster of markedly inflamed loops of small bowel within the right mid abdomen closely approximated to the surgical anastomosis. There is a somewhat ill-defined hypodense fluid collection 5.3 x 3.5 x 3.6 cm within this region (series 201, image 51). Additional hypodense collection located more anteriorly and medially measures approximately 1.7 x 2.3 x 3.2 cm (series 2001, image 51). These 2 collections may be contiguous with one another. While these may reflect on opacified loops of bowel, the irregular peripheral enhancement about these collections raises the possibility of intra-abdominal abscesses. Overall, the degree of inflammation and phlegmonous change has worsened as compared to most recent CT from 04/05/2014.  Remainder of the bowel is unremarkable without evidence of obstruction or inflammation.  Bladder within normal limits.  Uterus and ovaries  are absent.  No free air. Small volume free fluid present within the pelvis, which is likely reactive in nature.  Scattered mesenteric lymph nodes are similar relative to prior study. No pathologic Lee enlarged intra-abdominal pelvic lymph nodes identified.  Moderate aorto bi-iliac atherosclerotic  disease present. No aneurysm.  No acute osseous abnormality. No worrisome lytic or blastic osseous lesions.  IMPRESSION: Multiple clustered inflamed loops of bowel within the right abdomen, likely related to known history of Crohn's disease. Several hypodense collections impregnated within this area of inflammation are again seen, the largest of which measures approximately 5.3 x 3.5 x 3.6 cm. While these may reflect unopacified loops of bowel, findings are again suspicious for possible associated intra-abdominal abscesses. Overall, the degree of inflammation and phlegmonous change has slightly worsened relative to most recent CT from 04/05/2014. No free air or associated obstruction.   Electronically Signed   By: Jeannine Boga M.D.   On: 05/19/2014 23:35    Medications:  Scheduled: . buPROPion  100 mg Oral QHS  . lamoTRIgine  200 mg Oral QHS  . methylPREDNISolone (SOLU-MEDROL) injection  40 mg Intravenous Q12H  . piperacillin-tazobactam (ZOSYN)  IV  3.375 g Intravenous 3 times per day  . sodium chloride  3 mL Intravenous Q12H   Continuous:   Assessment/Plan: 1) Crohn's flare. 2) Intramural abdominal abscesses.   Clinically she continues to make great strides.  I will advance her to a regular diet.  She is to continue with the current Rx.  I think it will be prudent to keep her over the weekend and then I will continue her management as an outpatient.  Plan: 1) Continue Zosyn and Solumedrol.   LOS: 2 days   Daril Warga D 05/21/2014, 8:23 AM

## 2014-05-21 NOTE — Progress Notes (Signed)
Patient ID: April May, female   DOB: 1955/09/18, 59 y.o.   MRN: [id number]    Subjective: Pt feels much better today.  No more pain  Objective: Vital signs in last 24 hours: Temp:  [97.8 F (36.6 C)-98.3 F (36.8 C)] 98 F (36.7 C) (01/08 0459) Pulse Rate:  [20-89] 73 (01/08 0459) Resp:  [18-20] 20 (01/08 0459) BP: (100-112)/(55-64) 106/64 mmHg (01/08 0459) SpO2:  [96 %-100 %] 100 % (01/08 0459) Weight:  [224 lb 10.4 oz (101.9 kg)] 224 lb 10.4 oz (101.9 kg) (01/08 0459) Last BM Date: 05/19/14  Intake/Output from previous day: 01/07 0701 - 01/08 0700 In: 3194.7 [P.O.:600; I.V.:2394.7; IV Piggyback:200] Out: 1150 [Urine:1150] Intake/Output this shift:    PE: Abd: soft, NT, ND, +BS, obese  Lab Results:   Recent Labs  05/20/14 0254 05/21/14 0500  WBC 15.4* 12.7*  HGB 12.3 12.5  HCT 37.7 39.0  PLT 291 337   BMET  Recent Labs  05/20/14 0254 05/21/14 0500  NA 135 136  K 3.0* 4.1  CL 102 105  CO2 27 22  GLUCOSE 108* 139*  BUN <5* 9  CREATININE 0.69 0.73  CALCIUM 8.2* 8.5   PT/INR No results for input(s): LABPROT, INR in the last 72 hours. CMP     Component Value Date/Time   NA 136 05/21/2014 0500   K 4.1 05/21/2014 0500   CL 105 05/21/2014 0500   CO2 22 05/21/2014 0500   GLUCOSE 139* 05/21/2014 0500   BUN 9 05/21/2014 0500   CREATININE 0.73 05/21/2014 0500   CALCIUM 8.5 05/21/2014 0500   PROT 7.2 05/19/2014 1816   ALBUMIN 3.5 05/19/2014 1816   AST 20 05/19/2014 1816   ALT 16 05/19/2014 1816   ALKPHOS 60 05/19/2014 1816   BILITOT 0.7 05/19/2014 1816   GFRNONAA >90 05/21/2014 0500   GFRAA >90 05/21/2014 0500   Lipase     Component Value Date/Time   LIPASE 32 05/19/2014 1816       Studies/Results: Ct Abdomen Pelvis W Contrast  05/19/2014   CLINICAL DATA:  Initial evaluation for acute abdominal pain.  EXAM: CT ABDOMEN AND PELVIS WITH CONTRAST  TECHNIQUE: Multidetector CT imaging of the abdomen and pelvis was performed using the  standard protocol following bolus administration of intravenous contrast.  CONTRAST:  174mL OMNIPAQUE IOHEXOL 300 MG/ML SOLN, 26mL OMNIPAQUE IOHEXOL 300 MG/ML SOLN  COMPARISON:  Prior study from 04/05/2014.  FINDINGS: The visualized lung bases are clear. No pleural or pericardial effusion.  Subcentimeter hypodensity within the left hepatic lobe noted. This is too small the characterize by CT, but may reflect a small cysts, and is stable from prior study. The liver is otherwise unremarkable. Gallbladder within normal limits. No biliary dilatation. The spleen, adrenal glands, and pancreas demonstrate a normal contrast enhanced appearance.  Kidneys are equal size with symmetric enhancement. No nephrolithiasis, hydronephrosis, or focal enhancing renal mass.  Small hiatal hernia noted. Stomach otherwise unremarkable. Sequela of prior ileocolic resection with primary reanastomosis again seen.  There is a cluster of markedly inflamed loops of small bowel within the right mid abdomen closely approximated to the surgical anastomosis. There is a somewhat ill-defined hypodense fluid collection 5.3 x 3.5 x 3.6 cm within this region (series 201, image 51). Additional hypodense collection located more anteriorly and medially measures approximately 1.7 x 2.3 x 3.2 cm (series 2001, image 51). These 2 collections may be contiguous with one another. While these may reflect on opacified loops of bowel, the irregular peripheral  enhancement about these collections raises the possibility of intra-abdominal abscesses. Overall, the degree of inflammation and phlegmonous change has worsened as compared to most recent CT from 04/05/2014.  Remainder of the bowel is unremarkable without evidence of obstruction or inflammation.  Bladder within normal limits.  Uterus and ovaries are absent.  No free air. Small volume free fluid present within the pelvis, which is likely reactive in nature.  Scattered mesenteric lymph nodes are similar relative  to prior study. No pathologic Lee enlarged intra-abdominal pelvic lymph nodes identified.  Moderate aorto bi-iliac atherosclerotic disease present. No aneurysm.  No acute osseous abnormality. No worrisome lytic or blastic osseous lesions.  IMPRESSION: Multiple clustered inflamed loops of bowel within the right abdomen, likely related to known history of Crohn's disease. Several hypodense collections impregnated within this area of inflammation are again seen, the largest of which measures approximately 5.3 x 3.5 x 3.6 cm. While these may reflect unopacified loops of bowel, findings are again suspicious for possible associated intra-abdominal abscesses. Overall, the degree of inflammation and phlegmonous change has slightly worsened relative to most recent CT from 04/05/2014. No free air or associated obstruction.   Electronically Signed   By: Jeannine Boga M.D.   On: 05/19/2014 23:35    Anti-infectives: Anti-infectives    Start     Dose/Rate Route Frequency Ordered Stop   05/20/14 0030  piperacillin-tazobactam (ZOSYN) IVPB 3.375 g     3.375 g12.5 mL/hr over 240 Minutes Intravenous 3 times per day 05/20/14 0025     05/20/14 0000  metroNIDAZOLE (FLAGYL) IVPB 500 mg  Status:  Discontinued     500 mg100 mL/hr over 60 Minutes Intravenous Every 8 hours 05/19/14 2352 05/20/14 0059       Assessment/Plan  1. Crohn's disease with intra-mural abscess  Plan: 1.  Agree with GI note.  No plans for surgery at this point.  Cont medical management with zosyn and solumedrol.  LOS: 2 days    Quintus Premo E 05/21/2014, 8:53 AM Pager: [phone]

## 2014-05-21 NOTE — Progress Notes (Signed)
PROGRESS NOTE  April May JAS:[id number] DOB: 1956-03-20 DOA: 05/19/2014 PCP: Scarlette Calico, MD  HPI/Brief narrative 59 year old female patient with history of Crohn's disease, recently discharged from the hospital for intra-abdominal abscess from her Crohn's disease Thanksgiving 2015, HTN, DM 2, was supposed to have a follow-up CT abdomen today, presented with acute onset of abdominal pain and CT abdomen demonstrates worsening of her abscesses and inflammation. GI and [facility] have consulted.  Assessment/Plan:  1. Intra-abdominal abscesses: Persistent versus recurrent, secondary to her underlying Crohn's disease. Discussed with GI, [facility] and IR: Her abscesses are not drainable as they are intramural. Supportive treatment with bowel rest (clear liquids) and IV Zosyn. Patient is clinically improving with medical therapy.  No surgical intervention indicated at this time. Plan keep patient until 05/24/2014 per GI recs.  05/21/2014 patient's diet was advanced 2. Crohn's flare: GI indicates that she needs to be on a biologic medication as she has clearly demonstrated persistent versus recurrent inflammation during the steroid taper. GI starting her on Humira when stable outpt. Continue IV Solu-Medrol 40 mg every 12 hours 3. Hypertension: Soft blood pressures. Antihypertensives currently on hold. Monitor. 4. Diet controlled DM 2: Follow CBGs and continue SSI 5. Hypokalemia: replaced.  Check mag 6. Leukocytosis: improving.  Family Communication:   Pt at beside Disposition Plan:   Home when medically stable      Procedures/Studies: Ct Abdomen Pelvis W Contrast  05/19/2014   CLINICAL DATA:  Initial evaluation for acute abdominal pain.  EXAM: CT ABDOMEN AND PELVIS WITH CONTRAST  TECHNIQUE: Multidetector CT imaging of the abdomen and pelvis was performed using the standard protocol following bolus administration of intravenous contrast.  CONTRAST:  173m OMNIPAQUE IOHEXOL 300 MG/ML SOLN,  26mOMNIPAQUE IOHEXOL 300 MG/ML SOLN  COMPARISON:  Prior study from 04/05/2014.  FINDINGS: The visualized lung bases are clear. No pleural or pericardial effusion.  Subcentimeter hypodensity within the left hepatic lobe noted. This is too small the characterize by CT, but may reflect a small cysts, and is stable from prior study. The liver is otherwise unremarkable. Gallbladder within normal limits. No biliary dilatation. The spleen, adrenal glands, and pancreas demonstrate a normal contrast enhanced appearance.  Kidneys are equal size with symmetric enhancement. No nephrolithiasis, hydronephrosis, or focal enhancing renal mass.  Small hiatal hernia noted. Stomach otherwise unremarkable. Sequela of prior ileocolic resection with primary reanastomosis again seen.  There is a cluster of markedly inflamed loops of small bowel within the right mid abdomen closely approximated to the surgical anastomosis. There is a somewhat ill-defined hypodense fluid collection 5.3 x 3.5 x 3.6 cm within this region (series 201, image 51). Additional hypodense collection located more anteriorly and medially measures approximately 1.7 x 2.3 x 3.2 cm (series 2001, image 51). These 2 collections may be contiguous with one another. While these may reflect on opacified loops of bowel, the irregular peripheral enhancement about these collections raises the possibility of intra-abdominal abscesses. Overall, the degree of inflammation and phlegmonous change has worsened as compared to most recent CT from 04/05/2014.  Remainder of the bowel is unremarkable without evidence of obstruction or inflammation.  Bladder within normal limits.  Uterus and ovaries are absent.  No free air. Small volume free fluid present within the pelvis, which is likely reactive in nature.  Scattered mesenteric lymph nodes are similar relative to prior study. No pathologic Lee enlarged intra-abdominal pelvic lymph nodes identified.  Moderate aorto bi-iliac  atherosclerotic disease present.  No aneurysm.  No acute osseous abnormality. No worrisome lytic or blastic osseous lesions.  IMPRESSION: Multiple clustered inflamed loops of bowel within the right abdomen, likely related to known history of Crohn's disease. Several hypodense collections impregnated within this area of inflammation are again seen, the largest of which measures approximately 5.3 x 3.5 x 3.6 cm. While these may reflect unopacified loops of bowel, findings are again suspicious for possible associated intra-abdominal abscesses. Overall, the degree of inflammation and phlegmonous change has slightly worsened relative to most recent CT from 04/05/2014. No free air or associated obstruction.   Electronically Signed   By: Jeannine Boga M.D.   On: 05/19/2014 23:35         Subjective: Patient is feeling much better with decreased abdominal pain. The patient denies any fevers, chills, chest pain, shortness breath, nausea, vomiting, diarrhea. Abdominal pain is 75% better.   Objective: Filed Vitals:   05/21/14 0005 05/21/14 0459 05/21/14 1059 05/21/14 1457  BP: 112/55 106/64 112/62 104/56  Pulse: 69 73 85 70  Temp: 97.8 F (36.6 C) 98 F (36.7 C)  98 F (36.7 C)  TempSrc: Oral Oral  Oral  Resp: 18 20  16   Height:      Weight:  101.9 kg (224 lb 10.4 oz)    SpO2: 96% 100%  99%    Intake/Output Summary (Last 24 hours) at 05/21/14 1748 Last data filed at 05/21/14 1328  Gross per 24 hour  Intake   3143 ml  Output    500 ml  Net   2643 ml   Weight change: 1.2 kg (2 lb 10.3 oz) Exam:   General:  Pt is alert, follows commands appropriately, not in acute distress  HEENT: No icterus, No thrush,[place]/AT  Cardiovascular: RRR, S1/S2, no rubs, no gallops  Respiratory: CTA bilaterally, no wheezing, no crackles, no rhonchi  Abdomen: Soft/+BS, non tender, non distended, no guarding  Extremities: No edema, No lymphangitis, No petechiae, No rashes, no synovitis  Data  Reviewed: Basic Metabolic Panel:  Recent Labs Lab 05/19/14 1816 05/20/14 0249 05/20/14 0254 05/21/14 0500  NA 136  --  135 136  K 3.6  --  3.0* 4.1  CL 102  --  102 105  CO2 21  --  27 22  GLUCOSE 95  --  108* 139*  BUN 7  --  <5* 9  CREATININE 0.77  --  0.69 0.73  CALCIUM 9.3  --  8.2* 8.5  MG  --  1.6  --   --    Liver Function Tests:  Recent Labs Lab 05/19/14 1816  AST 20  ALT 16  ALKPHOS 60  BILITOT 0.7  PROT 7.2  ALBUMIN 3.5    Recent Labs Lab 05/19/14 1816  LIPASE 32   No results for input(s): AMMONIA in the last 168 hours. CBC:  Recent Labs Lab 05/19/14 1816 05/20/14 0254 05/21/14 0500  WBC 21.8* 15.4* 12.7*  NEUTROABS 15.9*  --   --   HGB 13.9 12.3 12.5  HCT 42.4 37.7 39.0  MCV 93.0 93.1 92.6  PLT 333 291 337   Cardiac Enzymes: No results for input(s): CKTOTAL, CKMB, CKMBINDEX, TROPONINI in the last 168 hours. BNP: Invalid input(s): POCBNP CBG:  Recent Labs Lab 05/21/14 05/21/14 0436 05/21/14 0904 05/21/14 1154 05/21/14 1638  GLUCAP 142* 142* 150* 148* 128*    No results found for this or any previous visit (from the past 240 hour(s)).   Scheduled Meds: . buPROPion  100 mg Oral  QHS  . insulin aspart  0-9 Units Subcutaneous TID WC  . lamoTRIgine  200 mg Oral QHS  . methylPREDNISolone (SOLU-MEDROL) injection  40 mg Intravenous Q12H  . piperacillin-tazobactam (ZOSYN)  IV  3.375 g Intravenous 3 times per day  . sodium chloride  3 mL Intravenous Q12H   Continuous Infusions:    April Voong, DO  [facility] Pager [phone]  If 7PM-7AM, please contact night-coverage [email or web address] Password [email or web address] 05/21/2014, 5:48 PM   LOS: 2 days

## 2014-05-22 DIAGNOSIS — K651 Peritoneal abscess: Principal | ICD-10-CM

## 2014-05-22 DIAGNOSIS — K50014 Crohn's disease of small intestine with abscess: Secondary | ICD-10-CM

## 2014-05-22 LAB — CBC
HEMATOCRIT: 37.5 % (ref 36.0–46.0)
HEMOGLOBIN: 12 g/dL (ref 12.0–15.0)
MCH: 29.9 pg (ref 26.0–34.0)
MCHC: 32 g/dL (ref 30.0–36.0)
MCV: 93.5 fL (ref 78.0–100.0)
Platelets: 325 10*3/uL (ref 150–400)
RBC: 4.01 MIL/uL (ref 3.87–5.11)
RDW: 14.1 % (ref 11.5–15.5)
WBC: 15.1 10*3/uL — AB (ref 4.0–10.5)

## 2014-05-22 LAB — BASIC METABOLIC PANEL
Anion gap: 8 (ref 5–15)
BUN: 15 mg/dL (ref 6–23)
CO2: 23 mmol/L (ref 19–32)
Calcium: 8.8 mg/dL (ref 8.4–10.5)
Chloride: 106 mEq/L (ref 96–112)
Creatinine, Ser: 0.77 mg/dL (ref 0.50–1.10)
GFR calc Af Amer: >90 mL/min (ref >90)
GFR calc non Af Amer: >90 mL/min (ref >90)
GLUCOSE: 172 mg/dL — AB (ref 70–99)
Potassium: 4 mmol/L (ref 3.5–5.1)
Sodium: 137 mmol/L (ref 135–145)

## 2014-05-22 LAB — GLUCOSE, CAPILLARY
GLUCOSE-CAPILLARY: 128 mg/dL — AB (ref 70–99)
GLUCOSE-CAPILLARY: 118 mg/dL — AB (ref 70–99)
Glucose-Capillary: 169 mg/dL — ABNORMAL HIGH (ref 70–99)
Glucose-Capillary: 80 mg/dL (ref 70–99)

## 2014-05-22 LAB — MAGNESIUM: MAGNESIUM: 2 mg/dL (ref 1.5–2.5)

## 2014-05-22 MED ORDER — METHYLPREDNISOLONE SODIUM SUCC 40 MG IJ SOLR
10.0000 mg | Freq: Two times a day (BID) | INTRAMUSCULAR | Status: DC
  Administered 2014-05-22 (×2): 10 mg via INTRAVENOUS
  Filled 2014-05-22 (×4): qty 0.25

## 2014-05-22 MED ORDER — METHYLPREDNISOLONE SODIUM SUCC 40 MG IJ SOLR: 30.0000 mg | Freq: Two times a day (BID) | INTRAMUSCULAR | Status: DC

## 2014-05-22 NOTE — Progress Notes (Signed)
    Progress Note Covering for Dr. Benson Norway   Subjective  Feels well. No abd complaints.   Objective  Vital signs in last 24 hours: Temp:  [97.8 F (36.6 C)-98 F (36.7 C)] 97.8 F (36.6 C) (01/09 0515) Pulse Rate:  [61-85] 61 (01/09 0515) Resp:  [16-18] 18 (01/09 0515) BP: (95-112)/(48-62) 103/55 mmHg (01/09 0515) SpO2:  [98 %-99 %] 98 % (01/09 0515) Weight:  [226 lb 10.1 oz (102.8 kg)] 226 lb 10.1 oz (102.8 kg) (01/09 0515) Last BM Date: 05/21/14 General:   Alert,  Well-developed,   in NAD Heart:  Regular rate and rhythm; no murmurs Abdomen:  Soft, nontender and nondistended. Normal bowel sounds, without guarding, and without rebound.   Extremities:  Without edema. Neurologic:  Alert and  oriented x4;  grossly normal neurologically. Psych:  Alert and cooperative. Normal mood and affect.  Intake/Output from previous day: 01/08 0701 - 01/09 0700 In: 5797 [P.O.:1160; I.V.:375; IV Piggyback:100] Out: 775 [Urine:775] Intake/Output this shift:    Lab Results:  Recent Labs  05/20/14 0254 05/21/14 0500 05/22/14 0401  WBC 15.4* 12.7* 15.1*  HGB 12.3 12.5 12.0  HCT 37.7 39.0 37.5  PLT 291 337 325   BMET  Recent Labs  05/20/14 0254 05/21/14 0500 05/22/14 0401  NA 135 136 137  K 3.0* 4.1 4.0  CL 102 105 106  CO2 27 22 23   GLUCOSE 108* 139* 172*  BUN <5* 9 15  CREATININE 0.69 0.73 0.77  CALCIUM 8.2* 8.5 8.8   LFT  Recent Labs  05/19/14 1816  PROT 7.2  ALBUMIN 3.5  AST 20  ALT 16  ALKPHOS 60  BILITOT 0.7    Recent Labs  05/20/14 1430  HEPBSAG NEGATIVE      Assessment & Plan   1) Crohn's Flare. 2) Intra-abdominal abscesses.  She needs to be on a biologic medication as she has clearly demonstrated a persistence versus recurrence of her inflammation during the steroid taper. Her abscesses are not drainable per IR as they are intramural. At this time she has consented to pursing treatment with a biologic and I will start her on Humira after her  abcsessed have resolved.  Plan: 1) Continue with IV Zosyn. 2) Decrease Solumedrol to 30 mg IV Q12 hours as she is having problems sleeping likely from this medication. 3) Check TB gold pending and HBsAg negative. Humira will be started as an outpatient.   Principal Problem:   Crohn's disease of small intestine with abscess Active Problems:   HYPERTENSION, BENIGN ESSENTIAL   DM (diabetes mellitus), type 2, uncontrolled   Abdominal pain, acute   Abscess of abdominal cavity    LOS: 3 days   Greta Yung T. Fuller Plan MD 05/22/2014, 8:36 AM

## 2014-05-22 NOTE — Progress Notes (Signed)
PROGRESS NOTE  April May CVE:[id number] DOB: 05-Oct-1955 DOA: 05/19/2014 PCP: Scarlette Calico, MD  Assessment/Plan: Intra-abdominal abscesses -Persistent versus recurrent -The patient was on her last week of a prednisone taper from her prior to admission when she was discharged 04/08/2014 -Appreciate general surgery and GI follow-up -Plans noted for repeat CT abdomen pelvis if WBC continues to increase -Continue intravenous Zosyn -05/21/2014 patient's diet diet was advanced--patient is tolerating Crohn's flare -IV Solu-Medrol decreased on 05/22/2014 -Repeat CBC am -Appreciate GI follow-up -GI indicates that she needs to be on a biologic medication as she has clearly demonstrated persistent versus recurrent inflammation during the steroid taper Hypertension  -Blood pressures remained labile and soft  -Continue to hold antihypertensive medications   diabetes mellitus type 2--diet controlled -Hemoglobin A1c -NovoLog sliding scale -Expected increase in CBGs due to steroids Hypokalemia -Repleted  Family Communication:   Pt at beside Disposition Plan:   Home when medically stable       Procedures/Studies: Ct Abdomen Pelvis W Contrast  05/19/2014   CLINICAL DATA:  Initial evaluation for acute abdominal pain.  EXAM: CT ABDOMEN AND PELVIS WITH CONTRAST  TECHNIQUE: Multidetector CT imaging of the abdomen and pelvis was performed using the standard protocol following bolus administration of intravenous contrast.  CONTRAST:  147m OMNIPAQUE IOHEXOL 300 MG/ML SOLN, 276mOMNIPAQUE IOHEXOL 300 MG/ML SOLN  COMPARISON:  Prior study from 04/05/2014.  FINDINGS: The visualized lung bases are clear. No pleural or pericardial effusion.  Subcentimeter hypodensity within the left hepatic lobe noted. This is too small the characterize by CT, but may reflect a small cysts, and is stable from prior study. The liver is otherwise unremarkable. Gallbladder within normal limits. No biliary  dilatation. The spleen, adrenal glands, and pancreas demonstrate a normal contrast enhanced appearance.  Kidneys are equal size with symmetric enhancement. No nephrolithiasis, hydronephrosis, or focal enhancing renal mass.  Small hiatal hernia noted. Stomach otherwise unremarkable. Sequela of prior ileocolic resection with primary reanastomosis again seen.  There is a cluster of markedly inflamed loops of small bowel within the right mid abdomen closely approximated to the surgical anastomosis. There is a somewhat ill-defined hypodense fluid collection 5.3 x 3.5 x 3.6 cm within this region (series 201, image 51). Additional hypodense collection located more anteriorly and medially measures approximately 1.7 x 2.3 x 3.2 cm (series 2001, image 51). These 2 collections may be contiguous with one another. While these may reflect on opacified loops of bowel, the irregular peripheral enhancement about these collections raises the possibility of intra-abdominal abscesses. Overall, the degree of inflammation and phlegmonous change has worsened as compared to most recent CT from 04/05/2014.  Remainder of the bowel is unremarkable without evidence of obstruction or inflammation.  Bladder within normal limits.  Uterus and ovaries are absent.  No free air. Small volume free fluid present within the pelvis, which is likely reactive in nature.  Scattered mesenteric lymph nodes are similar relative to prior study. No pathologic Lee enlarged intra-abdominal pelvic lymph nodes identified.  Moderate aorto bi-iliac atherosclerotic disease present. No aneurysm.  No acute osseous abnormality. No worrisome lytic or blastic osseous lesions.  IMPRESSION: Multiple clustered inflamed loops of bowel within the right abdomen, likely related to known history of Crohn's disease. Several hypodense collections impregnated within this area of inflammation are again seen, the largest of which measures approximately 5.3 x 3.5 x 3.6 cm. While these  may reflect unopacified loops of bowel, findings are again suspicious  for possible associated intra-abdominal abscesses. Overall, the degree of inflammation and phlegmonous change has slightly worsened relative to most recent CT from 04/05/2014. No free air or associated obstruction.   Electronically Signed   By: Jeannine Boga M.D.   On: 05/19/2014 23:35         Subjective:  patient states that she is continuing to feel better. She was tolerating her diet. She denies any vomiting, abdominal pain, diarrhea.  Objective: Filed Vitals:   05/21/14 1457 05/21/14 1956 05/22/14 0515 05/22/14 1459  BP: 104/56 95/48 103/55 120/51  Pulse: 70 64 61 48  Temp: 98 F (36.7 C) 97.9 F (36.6 C) 97.8 F (36.6 C) 98.1 F (36.7 C)  TempSrc: Oral Oral Oral Oral  Resp: 16 18 18    Height:      Weight:   102.8 kg (226 lb 10.1 oz)   SpO2: 99% 98% 98% 100%    Intake/Output Summary (Last 24 hours) at 05/22/14 1836 Last data filed at 05/22/14 1802  Gross per 24 hour  Intake    650 ml  Output    500 ml  Net    150 ml   Weight change: 0.9 kg (1 lb 15.8 oz) Exam:   General:  Pt is alert, follows commands appropriately, not in acute distress  HEENT: No icterus, No thrush, [place]/AT  Cardiovascular: RRR, S1/S2, no rubs, no gallops  Respiratory: CTA bilaterally, no wheezing, no crackles, no rhonchi  Abdomen: Soft/+BS, non tender, non distended, no guarding  Extremities: trace LE edema, No lymphangitis, No petechiae, No rashes, no synovitis  Data Reviewed: Basic Metabolic Panel:  Recent Labs Lab 05/19/14 1816 05/20/14 0249 05/20/14 0254 05/21/14 0500 05/22/14 0401  NA 136  --  135 136 137  K 3.6  --  3.0* 4.1 4.0  CL 102  --  102 105 106  CO2 21  --  27 22 23   GLUCOSE 95  --  108* 139* 172*  BUN 7  --  <5* 9 15  CREATININE 0.77  --  0.69 0.73 0.77  CALCIUM 9.3  --  8.2* 8.5 8.8  MG  --  1.6  --   --  2.0   Liver Function Tests:  Recent Labs Lab 05/19/14 1816  AST 20  ALT  16  ALKPHOS 60  BILITOT 0.7  PROT 7.2  ALBUMIN 3.5    Recent Labs Lab 05/19/14 1816  LIPASE 32   No results for input(s): AMMONIA in the last 168 hours. CBC:  Recent Labs Lab 05/19/14 1816 05/20/14 0254 05/21/14 0500 05/22/14 0401  WBC 21.8* 15.4* 12.7* 15.1*  NEUTROABS 15.9*  --   --   --   HGB 13.9 12.3 12.5 12.0  HCT 42.4 37.7 39.0 37.5  MCV 93.0 93.1 92.6 93.5  PLT 333 291 337 325   Cardiac Enzymes: No results for input(s): CKTOTAL, CKMB, CKMBINDEX, TROPONINI in the last 168 hours. BNP: Invalid input(s): POCBNP CBG:  Recent Labs Lab 05/21/14 1638 05/21/14 2219 05/22/14 0619 05/22/14 1206 05/22/14 1614  GLUCAP 128* 137* 169* 80 128*    No results found for this or any previous visit (from the past 240 hour(s)).   Scheduled Meds: . buPROPion  100 mg Oral QHS  . insulin aspart  0-9 Units Subcutaneous TID WC  . lamoTRIgine  200 mg Oral QHS  . methylPREDNISolone (SOLU-MEDROL) injection  10 mg Intravenous Q12H  . piperacillin-tazobactam (ZOSYN)  IV  3.375 g Intravenous 3 times per day  . sodium chloride  3 mL Intravenous Q12H   Continuous Infusions:    Breaker Springer, DO  [facility] Pager [phone]  If 7PM-7AM, please contact night-coverage [email or web address] Password [email or web address] 05/22/2014, 6:36 PM   LOS: 3 days

## 2014-05-22 NOTE — Progress Notes (Signed)
  Subjective: Denies abdominal pain.  Bowels moving.  Objective: Vital signs in last 24 hours: Temp:  [97.8 F (36.6 C)-98 F (36.7 C)] 97.8 F (36.6 C) (01/09 0515) Pulse Rate:  [61-85] 61 (01/09 0515) Resp:  [16-18] 18 (01/09 0515) BP: (95-112)/(48-62) 103/55 mmHg (01/09 0515) SpO2:  [98 %-99 %] 98 % (01/09 0515) Weight:  [226 lb 10.1 oz (102.8 kg)] 226 lb 10.1 oz (102.8 kg) (01/09 0515) Last BM Date: 05/21/14  Intake/Output from previous day: 01/08 0701 - 01/09 0700 In: 1635 [P.O.:1160; I.V.:375; IV Piggyback:100] Out: 775 [Urine:775] Intake/Output this shift:    PE: General- In NAD Abdomen-soft, nontender  Lab Results:   Recent Labs  05/21/14 0500 05/22/14 0401  WBC 12.7* 15.1*  HGB 12.5 12.0  HCT 39.0 37.5  PLT 337 325   BMET  Recent Labs  05/21/14 0500 05/22/14 0401  NA 136 137  K 4.1 4.0  CL 105 106  CO2 22 23  GLUCOSE 139* 172*  BUN 9 15  CREATININE 0.73 0.77  CALCIUM 8.5 8.8   PT/INR No results for input(s): LABPROT, INR in the last 72 hours. Comprehensive Metabolic Panel:    Component Value Date/Time   NA 137 05/22/2014 0401   NA 136 05/21/2014 0500   K 4.0 05/22/2014 0401   K 4.1 05/21/2014 0500   CL 106 05/22/2014 0401   CL 105 05/21/2014 0500   CO2 23 05/22/2014 0401   CO2 22 05/21/2014 0500   BUN 15 05/22/2014 0401   BUN 9 05/21/2014 0500   CREATININE 0.77 05/22/2014 0401   CREATININE 0.73 05/21/2014 0500   GLUCOSE 172* 05/22/2014 0401   GLUCOSE 139* 05/21/2014 0500   CALCIUM 8.8 05/22/2014 0401   CALCIUM 8.5 05/21/2014 0500   AST 20 05/19/2014 1816   AST 11 04/06/2014 0516   ALT 16 05/19/2014 1816   ALT 8 04/06/2014 0516   ALKPHOS 60 05/19/2014 1816   ALKPHOS 78 04/06/2014 0516   BILITOT 0.7 05/19/2014 1816   BILITOT 0.4 04/06/2014 0516   PROT 7.2 05/19/2014 1816   PROT 7.5 04/06/2014 0516   ALBUMIN 3.5 05/19/2014 1816   ALBUMIN 3.2* 04/06/2014 0516     Studies/Results: No results  found.  Anti-infectives: Anti-infectives    Start     Dose/Rate Route Frequency Ordered Stop   05/20/14 0030  piperacillin-tazobactam (ZOSYN) IVPB 3.375 g     3.375 g12.5 mL/hr over 240 Minutes Intravenous 3 times per day 05/20/14 0025     05/20/14 0000  metroNIDAZOLE (FLAGYL) IVPB 500 mg  Status:  Discontinued     500 mg100 mL/hr over 60 Minutes Intravenous Every 8 hours 05/19/14 2352 05/20/14 0059      Assessment Principal Problem:   Crohn's disease of small intestine with abscess-on IV Zosyn; also on high dose Solumedrol; WBC up which could be from high dose steroids or infection; she was being weaned off steroids prior to admission   LOS: 3 days   Plan: Decrease Solumedrol.  Check WBC tomorrow. If it continues to rise will consider repeat CT   April May 05/22/2014

## 2014-05-23 DIAGNOSIS — R100 Acute abdomen: Secondary | ICD-10-CM

## 2014-05-23 LAB — GLUCOSE, CAPILLARY
GLUCOSE-CAPILLARY: 118 mg/dL — AB (ref 70–99)
GLUCOSE-CAPILLARY: 70 mg/dL (ref 70–99)
GLUCOSE-CAPILLARY: 104 mg/dL — AB (ref 70–99)
GLUCOSE-CAPILLARY: 97 mg/dL (ref 70–99)

## 2014-05-23 LAB — CBC WITH DIFFERENTIAL/PLATELET
BASOS PCT: 0 % (ref 0–1)
Basophils Absolute: 0 10*3/uL (ref 0.0–0.1)
Eosinophils Absolute: 0 10*3/uL (ref 0.0–0.7)
Eosinophils Relative: 0 % (ref 0–5)
HCT: 37.9 % (ref 36.0–46.0)
Hemoglobin: 12.2 g/dL (ref 12.0–15.0)
Lymphocytes Relative: 17 % (ref 12–46)
Lymphs Abs: 2.2 10*3/uL (ref 0.7–4.0)
MCH: 30 pg (ref 26.0–34.0)
MCHC: 32.2 g/dL (ref 30.0–36.0)
MCV: 93.1 fL (ref 78.0–100.0)
Monocytes Absolute: 1 10*3/uL (ref 0.1–1.0)
Monocytes Relative: 8 % (ref 3–12)
NEUTROS PCT: 75 % (ref 43–77)
Neutro Abs: 9.8 10*3/uL — ABNORMAL HIGH (ref 1.7–7.7)
Platelets: 386 10*3/uL (ref 150–400)
RBC: 4.07 MIL/uL (ref 3.87–5.11)
RDW: 14.3 % (ref 11.5–15.5)
WBC: 13.1 10*3/uL — AB (ref 4.0–10.5)

## 2014-05-23 LAB — QUANTIFERON IN TUBE
QFT TB AG MINUS NIL VALUE: 0 [IU]/mL
QUANTIFERON MITOGEN VALUE: 9.72 [IU]/mL
QUANTIFERON TB AG VALUE: 0.03 IU/mL
QUANTIFERON TB GOLD: NEGATIVE
Quantiferon Nil Value: 0.03 IU/mL

## 2014-05-23 LAB — HEPATITIS B SURFACE ANTIBODY, QUANTITATIVE

## 2014-05-23 LAB — QUANTIFERON TB GOLD ASSAY (BLOOD)

## 2014-05-23 MED ORDER — PREDNISONE 5 MG PO TABS
5.0000 mg | ORAL_TABLET | Freq: Every day | ORAL | Status: DC
  Filled 2014-05-23: qty 1

## 2014-05-23 MED ORDER — PREDNISONE 20 MG PO TABS: 30.0000 mg | ORAL_TABLET | Freq: Every day | ORAL | Status: DC

## 2014-05-23 NOTE — Progress Notes (Signed)
PROGRESS NOTE  April May WCB:[id number] DOB: [birth year] DOA: 05/19/2014 PCP: Scarlette Calico, MD  Assessment/Plan: Intra-abdominal abscesses -Persistent versus recurrent -The patient was on her last week of a prednisone taper from her prior to admission when she was discharged 04/08/2014 -Appreciate general surgery and GI follow-up -Plans noted for repeat CT abdomen pelvis if WBC continues to increase -Continue intravenous Zosyn-->plan to d/c with cipro and flagyl -05/21/2014 patient's diet diet was advanced--patient is tolerating Crohn's flare --suspect noncompliance with diet as patient has chocolates, Bojangles, non-diet sprite, and tootsie rolls at her bedside -NovoLog sliding scale -IV Solu-Medrol decreased on 05/22/2014 -IV solumedrol d/ced on 05/23/14-->po prednisone -steroid dose per consultants -Repeat CBC am -Appreciate GI follow-up -GI indicates that she needs to be on a biologic medication as she has clearly demonstrated persistent versus recurrent inflammation during the steroid taper Hypertension  -Blood pressures remained labile and soft  -Continue to hold antihypertensive medications  diabetes mellitus type 2--diet controlled -suspect noncompliance with diet as patient has chocolates, Bojangles, non-diet sprite, and tootsie rolls at her bedside -NovoLog sliding scale -Expected increase in CBGs due to steroids Hypokalemia -Repleted  Family Communication: Pt at beside Disposition Plan: Home when cleared by GI        Procedures/Studies: Ct Abdomen Pelvis W Contrast  05/19/2014   CLINICAL DATA:  Initial evaluation for acute abdominal pain.  EXAM: CT ABDOMEN AND PELVIS WITH CONTRAST  TECHNIQUE: Multidetector CT imaging of the abdomen and pelvis was performed using the standard protocol following bolus administration of intravenous contrast.  CONTRAST:  162m OMNIPAQUE IOHEXOL 300 MG/ML SOLN, 271mOMNIPAQUE IOHEXOL 300 MG/ML SOLN  COMPARISON:   Prior study from 04/05/2014.  FINDINGS: The visualized lung bases are clear. No pleural or pericardial effusion.  Subcentimeter hypodensity within the left hepatic lobe noted. This is too small the characterize by CT, but may reflect a small cysts, and is stable from prior study. The liver is otherwise unremarkable. Gallbladder within normal limits. No biliary dilatation. The spleen, adrenal glands, and pancreas demonstrate a normal contrast enhanced appearance.  Kidneys are equal size with symmetric enhancement. No nephrolithiasis, hydronephrosis, or focal enhancing renal mass.  Small hiatal hernia noted. Stomach otherwise unremarkable. Sequela of prior ileocolic resection with primary reanastomosis again seen.  There is a cluster of markedly inflamed loops of small bowel within the right mid abdomen closely approximated to the surgical anastomosis. There is a somewhat ill-defined hypodense fluid collection 5.3 x 3.5 x 3.6 cm within this region (series 201, image 51). Additional hypodense collection located more anteriorly and medially measures approximately 1.7 x 2.3 x 3.2 cm (series 2001, image 51). These 2 collections may be contiguous with one another. While these may reflect on opacified loops of bowel, the irregular peripheral enhancement about these collections raises the possibility of intra-abdominal abscesses. Overall, the degree of inflammation and phlegmonous change has worsened as compared to most recent CT from 04/05/2014.  Remainder of the bowel is unremarkable without evidence of obstruction or inflammation.  Bladder within normal limits.  Uterus and ovaries are absent.  No free air. Small volume free fluid present within the pelvis, which is likely reactive in nature.  Scattered mesenteric lymph nodes are similar relative to prior study. No pathologic Lee enlarged intra-abdominal pelvic lymph nodes identified.  Moderate aorto bi-iliac atherosclerotic disease present. No aneurysm.  No acute osseous  abnormality. No worrisome lytic or blastic osseous lesions.  IMPRESSION: Multiple clustered inflamed loops of bowel  within the right abdomen, likely related to known history of Crohn's disease. Several hypodense collections impregnated within this area of inflammation are again seen, the largest of which measures approximately 5.3 x 3.5 x 3.6 cm. While these may reflect unopacified loops of bowel, findings are again suspicious for possible associated intra-abdominal abscesses. Overall, the degree of inflammation and phlegmonous change has slightly worsened relative to most recent CT from 04/05/2014. No free air or associated obstruction.   Electronically Signed   By: Jeannine Boga M.D.   On: 05/19/2014 23:35        Subjective: Patient denies fevers, chills, headache, chest pain, dyspnea, nausea, vomiting, diarrhea, abdominal pain, dysuria, hematuria   Objective: Filed Vitals:   05/22/14 1459 05/22/14 2033 05/23/14 0500 05/23/14 1504  BP: 120/51 120/49 103/59 110/56  Pulse: 48 52 51 61  Temp: 98.1 F (36.7 C) 98.4 F (36.9 C) 97.7 F (36.5 C) 97.9 F (36.6 C)  TempSrc: Oral Oral Oral Oral  Resp:  18 18   Height:      Weight:   103.7 kg (228 lb 9.9 oz)   SpO2: 100% 100% 100% 100%    Intake/Output Summary (Last 24 hours) at 05/23/14 1522 Last data filed at 05/23/14 1251  Gross per 24 hour  Intake   1100 ml  Output      0 ml  Net   1100 ml   Weight change: 0.9 kg (1 lb 15.7 oz) Exam:   General:  Pt is alert, follows commands appropriately, not in acute distress  HEENT: No icterus, No thrush,  /AT  Cardiovascular: RRR, S1/S2, no rubs, no gallops  Respiratory: CTA bilaterally, no wheezing, no crackles, no rhonchi  Abdomen: Soft/+BS, non tender, non distended, no guarding  Extremities: No edema, No lymphangitis, No petechiae, No rashes, no synovitis  Data Reviewed: Basic Metabolic Panel:  Recent Labs Lab 05/19/14 1816 05/20/14 0249 05/20/14 0254  05/21/14 0500 05/22/14 0401  NA 136  --  135 136 137  K 3.6  --  3.0* 4.1 4.0  CL 102  --  102 105 106  CO2 21  --  27 22 23   GLUCOSE 95  --  108* 139* 172*  BUN 7  --  <5* 9 15  CREATININE 0.77  --  0.69 0.73 0.77  CALCIUM 9.3  --  8.2* 8.5 8.8  MG  --  1.6  --   --  2.0   Liver Function Tests:  Recent Labs Lab 05/19/14 1816  AST 20  ALT 16  ALKPHOS 60  BILITOT 0.7  PROT 7.2  ALBUMIN 3.5    Recent Labs Lab 05/19/14 1816  LIPASE 32   No results for input(s): AMMONIA in the last 168 hours. CBC:  Recent Labs Lab 05/19/14 1816 05/20/14 0254 05/21/14 0500 05/22/14 0401 05/23/14 0457  WBC 21.8* 15.4* 12.7* 15.1* 13.1*  NEUTROABS 15.9*  --   --   --  9.8*  HGB 13.9 12.3 12.5 12.0 12.2  HCT 42.4 37.7 39.0 37.5 37.9  MCV 93.0 93.1 92.6 93.5 93.1  PLT 333 291 337 325 386   Cardiac Enzymes: No results for input(s): CKTOTAL, CKMB, CKMBINDEX, TROPONINI in the last 168 hours. BNP: Invalid input(s): POCBNP CBG:  Recent Labs Lab 05/22/14 1206 05/22/14 1614 05/22/14 2048 05/23/14 0741 05/23/14 1148  GLUCAP 80 128* 118* 118* 70    No results found for this or any previous visit (from the past 240 hour(s)).   Scheduled Meds: . buPROPion  100  mg Oral QHS  . insulin aspart  0-9 Units Subcutaneous TID WC  . lamoTRIgine  200 mg Oral QHS  . piperacillin-tazobactam (ZOSYN)  IV  3.375 g Intravenous 3 times per day  . [START ON 05/26/2014] predniSONE  30 mg Oral QAC breakfast  . sodium chloride  3 mL Intravenous Q12H   Continuous Infusions:    Jamieson Lisa, DO  [facility] Pager [phone]  If 7PM-7AM, please contact night-coverage [email or web address] Password [email or web address] 05/23/2014, 3:22 PM   LOS: 4 days

## 2014-05-23 NOTE — Progress Notes (Signed)
          Daily Rounding Note        Covering for Dr. Benson Norway  05/23/2014, 8:55 AM  LOS: 4 days   SUBJECTIVE:       Feels well.  Tolerating diet.  Once daily BM.  Lots of questions about future plans for treatment.   OBJECTIVE:         Vital signs in last 24 hours:    Temp:  [97.7 F (36.5 C)-98.4 F (36.9 C)] 97.7 F (36.5 C) (01/10 0500) Pulse Rate:  [48-52] 51 (01/10 0500) Resp:  [18] 18 (01/10 0500) BP: (103-120)/(49-59) 103/59 mmHg (01/10 0500) SpO2:  [100 %] 100 % (01/10 0500) Weight:  [228 lb 9.9 oz (103.7 kg)] 228 lb 9.9 oz (103.7 kg) (01/10 0500) Last BM Date: 05/21/14 Filed Weights   05/21/14 0459 05/22/14 0515 05/23/14 0500  Weight: 224 lb 10.4 oz (101.9 kg) 226 lb 10.1 oz (102.8 kg) 228 lb 9.9 oz (103.7 kg)   General: pleasant, comfortable, obese, looks well.    Heart: RRR Chest: clear bil.  No dyspnea or cough Abdomen: soft, NT, active BS, ND  Extremities: no CCE Neuro/Psych:  Pleasant, alert, oriented x 3.   Intake/Output from previous day: 01/09 0701 - 01/10 0700 In: 860 [P.O.:660; IV Piggyback:200] Out: -   Intake/Output this shift: Total I/O In: 240 [P.O.:240] Out: -   Lab Results:  Recent Labs  05/21/14 0500 05/22/14 0401 05/23/14 0457  WBC 12.7* 15.1* 13.1*  HGB 12.5 12.0 12.2  HCT 39.0 37.5 37.9  PLT 337 325 386   BMET  Recent Labs  05/21/14 0500 05/22/14 0401  NA 136 137  K 4.1 4.0  CL 105 106  CO2 22 23  GLUCOSE 139* 172*  BUN 9 15  CREATININE 0.73 0.77  CALCIUM 8.5 8.8   Hepatitis Panel  Recent Labs  05/20/14 1430  HEPBSAG NEGATIVE   Scheduled Meds: . buPROPion  100 mg Oral QHS  . insulin aspart  0-9 Units Subcutaneous TID WC  . lamoTRIgine  200 mg Oral QHS  . piperacillin-tazobactam (ZOSYN)  IV  3.375 g Intravenous 3 times per day  . [START ON 05/24/2014] predniSONE  5 mg Oral Q breakfast  . sodium chloride  3 mL Intravenous Q12H   Continuous Infusions:  PRN  Meds:.acetaminophen, fluticasone, morphine injection, ondansetron, zolpidem   ASSESMENT:   *  Crohn's flare.  Proving complicated with development of intramural absceses (on Rocephin). Previously "in remission", has only ever been treated with Steroids per her recall. Note Dr Zella Richer tapered Solumedrol and stopped it today with plans for prednisone 5 mg daily to start tomorrow.   *  Hep B surface Ag negative. TB skin test negative.   So will be able to start biologic (Remicade?)   PLAN   *  Will increase po prednisone to 30 mg daily.  Start today. Dr Benson Norway will resume care tomorrow.     April May  05/23/2014, 8:55 AM Pager: [phone]     Attending physician's note   I have taken an interval history, reviewed the chart and examined the patient. I agree with the Advanced Practitioner's note, impression and recommendations. Dr. Benson Norway to resume care tomorrow and plan ongoing Crohn's mgmt.   Pricilla Riffle. Fuller Plan, MD [facility]

## 2014-05-23 NOTE — Progress Notes (Signed)
  Subjective: No complaints.  Objective: Vital signs in last 24 hours: Temp:  [97.7 F (36.5 C)-98.4 F (36.9 C)] 97.7 F (36.5 C) (01/10 0500) Pulse Rate:  [48-52] 51 (01/10 0500) Resp:  [18] 18 (01/10 0500) BP: (103-120)/(49-59) 103/59 mmHg (01/10 0500) SpO2:  [100 %] 100 % (01/10 0500) Weight:  [228 lb 9.9 oz (103.7 kg)] 228 lb 9.9 oz (103.7 kg) (01/10 0500) Last BM Date: 05/21/14  Intake/Output from previous day: 01/09 0701 - 01/10 0700 In: 61 [P.O.:660; IV Piggyback:150] Out: -  Intake/Output this shift: Total I/O In: 240 [P.O.:240] Out: -   PE: General- In NAD Abdomen-soft, nontender  Lab Results:   Recent Labs  05/22/14 0401 05/23/14 0457  WBC 15.1* 13.1*  HGB 12.0 12.2  HCT 37.5 37.9  PLT 325 386   BMET  Recent Labs  05/21/14 0500 05/22/14 0401  NA 136 137  K 4.1 4.0  CL 105 106  CO2 22 23  GLUCOSE 139* 172*  BUN 9 15  CREATININE 0.73 0.77  CALCIUM 8.5 8.8   PT/INR No results for input(s): LABPROT, INR in the last 72 hours. Comprehensive Metabolic Panel:    Component Value Date/Time   NA 137 05/22/2014 0401   NA 136 05/21/2014 0500   K 4.0 05/22/2014 0401   K 4.1 05/21/2014 0500   CL 106 05/22/2014 0401   CL 105 05/21/2014 0500   CO2 23 05/22/2014 0401   CO2 22 05/21/2014 0500   BUN 15 05/22/2014 0401   BUN 9 05/21/2014 0500   CREATININE 0.77 05/22/2014 0401   CREATININE 0.73 05/21/2014 0500   GLUCOSE 172* 05/22/2014 0401   GLUCOSE 139* 05/21/2014 0500   CALCIUM 8.8 05/22/2014 0401   CALCIUM 8.5 05/21/2014 0500   AST 20 05/19/2014 1816   AST 11 04/06/2014 0516   ALT 16 05/19/2014 1816   ALT 8 04/06/2014 0516   ALKPHOS 60 05/19/2014 1816   ALKPHOS 78 04/06/2014 0516   BILITOT 0.7 05/19/2014 1816   BILITOT 0.4 04/06/2014 0516   PROT 7.2 05/19/2014 1816   PROT 7.5 04/06/2014 0516   ALBUMIN 3.5 05/19/2014 1816   ALBUMIN 3.2* 04/06/2014 0516     Studies/Results: No results found.  Anti-infectives: Anti-infectives    Start     Dose/Rate Route Frequency Ordered Stop   05/20/14 0030  piperacillin-tazobactam (ZOSYN) IVPB 3.375 g     3.375 g12.5 mL/hr over 240 Minutes Intravenous 3 times per day 05/20/14 0025     05/20/14 0000  metroNIDAZOLE (FLAGYL) IVPB 500 mg  Status:  Discontinued     500 mg100 mL/hr over 60 Minutes Intravenous Every 8 hours 05/19/14 2352 05/20/14 0059      Assessment Principal Problem:   Crohn's disease of small intestine with abscess-on IV Zosyn; WBC down with decreased steroid dose; clinically looks good  LOS: 4 days   Plan:  Stop Solumedrol.  Prednisone 5 mg daily.  Repeat CBC tomorrow.  April May J 05/23/2014

## 2014-05-24 ENCOUNTER — Other Ambulatory Visit: Payer: Self-pay

## 2014-05-24 LAB — CBC
HEMATOCRIT: 37.2 % (ref 36.0–46.0)
Hemoglobin: 12.4 g/dL (ref 12.0–15.0)
MCH: 32 pg (ref 26.0–34.0)
MCHC: 33.3 g/dL (ref 30.0–36.0)
MCV: 95.9 fL (ref 78.0–100.0)
Platelets: 384 10*3/uL (ref 150–400)
RBC: 3.88 MIL/uL (ref 3.87–5.11)
RDW: 14.3 % (ref 11.5–15.5)
WBC: 12.3 10*3/uL — AB (ref 4.0–10.5)

## 2014-05-24 LAB — GLUCOSE, CAPILLARY
GLUCOSE-CAPILLARY: 119 mg/dL — AB (ref 70–99)
Glucose-Capillary: 86 mg/dL (ref 70–99)

## 2014-05-24 MED ORDER — METRONIDAZOLE 500 MG PO TABS: 500.0000 mg | ORAL_TABLET | Freq: Three times a day (TID) | ORAL | Status: DC

## 2014-05-24 MED ORDER — CIPROFLOXACIN HCL 500 MG PO TABS: 500.0000 mg | ORAL_TABLET | Freq: Two times a day (BID) | ORAL | Status: DC

## 2014-05-24 MED ORDER — PREDNISONE 10 MG PO TABS: 30.0000 mg | ORAL_TABLET | Freq: Every day | ORAL | Status: DC

## 2014-05-24 NOTE — Discharge Summary (Signed)
Physician Discharge Summary  April May PJS:[id number] DOB: 10/10/1955 DOA: 05/19/2014  PCP: Scarlette Calico, MD  Admit date: 05/19/2014 Discharge date: 05/24/2014  Recommendations for Outpatient Follow-up:  1. Pt will need to follow up with PCP in 2 weeks post discharge 2. Please obtain BMP and CBC in 2 weeks  Discharge Diagnoses:  Intra-abdominal abscesses -Persistent versus recurrent -The patient was on her last week of a prednisone taper from her prior to admission when she was discharged 04/08/2014 -Appreciate general surgery and GI follow-up -Plans noted for repeat CT abdomen pelvis if WBC continues to increase -Continued intravenous Zosyn while in house-->d/c home with cipro and flagyl for 1 month per GI recommendations -05/21/2014 patient's diet diet was advanced--patient is tolerating Crohn's flare -suspect noncompliance with diet as patient has chocolates, Bojangles, non-diet sprite, and tootsie rolls at her bedside -NovoLog sliding scale -IV Solu-Medrol decreased on 05/22/2014 -IV solumedrol d/ced on 05/23/14-->po prednisone -per Dr. Ulyses Amor recommendations-->home with prednisone 49m daily -Repeat CBC am--WBC 12.3 on day of discharge -Appreciate GI follow-up -GI indicates that she needs to be on a biologic medication as she has clearly demonstrated persistent versus recurrent inflammation during the steroid taper Hypertension  -Blood pressures remained labile and soft  -Continue to hold antihypertensive medications  -As the patient's blood pressure was well controlled off all of azilsartan-chlorthalidone, the patient was instructed not to restart the medication after discharge -She will follow-up with her primary care provider to determine future need for antihypertensive medication diabetes mellitus type 2--diet controlled -suspect noncompliance with diet as patient has chocolates, Bojangles, non-diet sprite, and tootsie rolls at her bedside -NovoLog sliding  scale -Expected increase in CBGs due to steroids Hypokalemia -Repleted  Family Communication: Pt at beside  Discharge Condition: stable  Disposition: home  Diet:soft Wt Readings from Last 3 Encounters:  05/24/14 103.3 kg (227 lb 11.8 oz)  04/06/14 96.2 kg (212 lb 1.3 oz)  03/11/14 99.338 kg (219 lb)    History of present illness:  59year old female patient with history of Crohn's disease, recently discharged from the hospital for intra-abdominal abscess from her Crohn's disease Thanksgiving 2015, HTN, DM 2, was supposed to have a follow-up CT abdomen today, presented with acute onset of abdominal pain and CT abdomen demonstrates worsening of her abscesses and inflammation. GI and [facility] have consulted. Consultants: Dr. HBenson NorwayCCS  Discharge Exam: Filed Vitals:   05/24/14 0639  BP: 120/62  Pulse: 63  Temp: 98.4 F (36.9 C)  Resp: 16   Filed Vitals:   05/23/14 0500 05/23/14 1504 05/23/14 2116 05/24/14 0639  BP: 103/59 110/56 107/54 120/62  Pulse: 51 61 59 63  Temp: 97.7 F (36.5 C) 97.9 F (36.6 C) 98.4 F (36.9 C) 98.4 F (36.9 C)  TempSrc: Oral Oral Oral Oral  Resp: 18  16 16   Height:      Weight: 103.7 kg (228 lb 9.9 oz)   103.3 kg (227 lb 11.8 oz)  SpO2: 100% 100% 100% 99%   General: A&O x 3, NAD, pleasant, cooperative Cardiovascular: RRR, no rub, no gallop, no S3 Respiratory: CTAB, no wheeze, no rhonchi Abdomen:soft, nontender, nondistended, positive bowel sounds Extremities: trace LE edema, No lymphangitis, no petechiae  Discharge Instructions      Discharge Instructions    Diet - low sodium heart healthy    Complete by:  As directed      Discharge instructions    Complete by:  As directed   Do Not take your azilsartan/chlorthalidone until you follow up with  your primary care physician     Increase activity slowly    Complete by:  As directed             Medication List    STOP taking these medications        Azilsartan-Chlorthalidone 40-25  MG Tabs  Commonly known as:  EDARBYCLOR      TAKE these medications        AMITIZA 24 MCG capsule  Generic drug:  lubiprostone  Take 24 mcg by mouth at bedtime.     buPROPion 100 MG 12 hr tablet  Commonly known as:  WELLBUTRIN SR  Take 100 mg by mouth at bedtime.     ciprofloxacin 500 MG tablet  Commonly known as:  CIPRO  Take 1 tablet (500 mg total) by mouth 2 (two) times daily.     fluticasone 50 MCG/ACT nasal spray  Commonly known as:  FLONASE  Place 2 sprays into both nostrils daily as needed for allergies or rhinitis.     lamoTRIgine 200 MG tablet  Commonly known as:  LAMICTAL  Take 200 mg by mouth at bedtime.     metroNIDAZOLE 500 MG tablet  Commonly known as:  FLAGYL  Take 1 tablet (500 mg total) by mouth 3 (three) times daily.     predniSONE 10 MG tablet  Commonly known as:  DELTASONE  Take 3 tablets (30 mg total) by mouth daily with breakfast.         The results of significant diagnostics from this hospitalization (including imaging, microbiology, ancillary and laboratory) are listed below for reference.    Significant Diagnostic Studies: Ct Abdomen Pelvis W Contrast  05/19/2014   CLINICAL DATA:  Initial evaluation for acute abdominal pain.  EXAM: CT ABDOMEN AND PELVIS WITH CONTRAST  TECHNIQUE: Multidetector CT imaging of the abdomen and pelvis was performed using the standard protocol following bolus administration of intravenous contrast.  CONTRAST:  173m OMNIPAQUE IOHEXOL 300 MG/ML SOLN, 27mOMNIPAQUE IOHEXOL 300 MG/ML SOLN  COMPARISON:  Prior study from 04/05/2014.  FINDINGS: The visualized lung bases are clear. No pleural or pericardial effusion.  Subcentimeter hypodensity within the left hepatic lobe noted. This is too small the characterize by CT, but may reflect a small cysts, and is stable from prior study. The liver is otherwise unremarkable. Gallbladder within normal limits. No biliary dilatation. The spleen, adrenal glands, and pancreas demonstrate a  normal contrast enhanced appearance.  Kidneys are equal size with symmetric enhancement. No nephrolithiasis, hydronephrosis, or focal enhancing renal mass.  Small hiatal hernia noted. Stomach otherwise unremarkable. Sequela of prior ileocolic resection with primary reanastomosis again seen.  There is a cluster of markedly inflamed loops of small bowel within the right mid abdomen closely approximated to the surgical anastomosis. There is a somewhat ill-defined hypodense fluid collection 5.3 x 3.5 x 3.6 cm within this region (series 201, image 51). Additional hypodense collection located more anteriorly and medially measures approximately 1.7 x 2.3 x 3.2 cm (series 2001, image 51). These 2 collections may be contiguous with one another. While these may reflect on opacified loops of bowel, the irregular peripheral enhancement about these collections raises the possibility of intra-abdominal abscesses. Overall, the degree of inflammation and phlegmonous change has worsened as compared to most recent CT from 04/05/2014.  Remainder of the bowel is unremarkable without evidence of obstruction or inflammation.  Bladder within normal limits.  Uterus and ovaries are absent.  No free air. Small volume free fluid present within the pelvis, which  is likely reactive in nature.  Scattered mesenteric lymph nodes are similar relative to prior study. No pathologic Lee enlarged intra-abdominal pelvic lymph nodes identified.  Moderate aorto bi-iliac atherosclerotic disease present. No aneurysm.  No acute osseous abnormality. No worrisome lytic or blastic osseous lesions.  IMPRESSION: Multiple clustered inflamed loops of bowel within the right abdomen, likely related to known history of Crohn's disease. Several hypodense collections impregnated within this area of inflammation are again seen, the largest of which measures approximately 5.3 x 3.5 x 3.6 cm. While these may reflect unopacified loops of bowel, findings are again suspicious  for possible associated intra-abdominal abscesses. Overall, the degree of inflammation and phlegmonous change has slightly worsened relative to most recent CT from 04/05/2014. No free air or associated obstruction.   Electronically Signed   By: Jeannine Boga M.D.   On: 05/19/2014 23:35     Microbiology: No results found for this or any previous visit (from the past 240 hour(s)).   Labs: Basic Metabolic Panel:  Recent Labs Lab 05/19/14 1816 05/20/14 0249 05/20/14 0254 05/21/14 0500 05/22/14 0401  NA 136  --  135 136 137  K 3.6  --  3.0* 4.1 4.0  CL 102  --  102 105 106  CO2 21  --  27 22 23   GLUCOSE 95  --  108* 139* 172*  BUN 7  --  <5* 9 15  CREATININE 0.77  --  0.69 0.73 0.77  CALCIUM 9.3  --  8.2* 8.5 8.8  MG  --  1.6  --   --  2.0   Liver Function Tests:  Recent Labs Lab 05/19/14 1816  AST 20  ALT 16  ALKPHOS 60  BILITOT 0.7  PROT 7.2  ALBUMIN 3.5    Recent Labs Lab 05/19/14 1816  LIPASE 32   No results for input(s): AMMONIA in the last 168 hours. CBC:  Recent Labs Lab 05/19/14 1816 05/20/14 0254 05/21/14 0500 05/22/14 0401 05/23/14 0457 05/24/14 0330  WBC 21.8* 15.4* 12.7* 15.1* 13.1* 12.3*  NEUTROABS 15.9*  --   --   --  9.8*  --   HGB 13.9 12.3 12.5 12.0 12.2 12.4  HCT 42.4 37.7 39.0 37.5 37.9 37.2  MCV 93.0 93.1 92.6 93.5 93.1 95.9  PLT 333 291 337 325 386 384   Cardiac Enzymes: No results for input(s): CKTOTAL, CKMB, CKMBINDEX, TROPONINI in the last 168 hours. BNP: Invalid input(s): POCBNP CBG:  Recent Labs Lab 05/23/14 0741 05/23/14 1148 05/23/14 1635 05/23/14 2112 05/24/14 0648  GLUCAP 118* 70 104* 97 119*    Time coordinating discharge:  Greater than 30 minutes  Signed:  Shamere Campas, DO [facility] Pager: [phone] 05/24/2014, 10:37 AM

## 2014-05-24 NOTE — Progress Notes (Signed)
Subjective: Feeling well, but very confused about her treatment.  Objective: Vital signs in last 24 hours: Temp:  [97.9 F (36.6 C)-98.4 F (36.9 C)] 98.4 F (36.9 C) (01/11 0639) Pulse Rate:  [59-63] 63 (01/11 0639) Resp:  [16] 16 (01/11 0639) BP: (107-120)/(54-62) 120/62 mmHg (01/11 0639) SpO2:  [99 %-100 %] 99 % (01/11 0639) Weight:  [103.3 kg (227 lb 11.8 oz)] 103.3 kg (227 lb 11.8 oz) (01/11 0639) Last BM Date: 05/21/14  Intake/Output from previous day: 01/10 0701 - 01/11 0700 In: 1605 [P.O.:1455; IV Piggyback:150] Out: 550 [Urine:550] Intake/Output this shift:    General appearance: alert and no distress GI: soft, non-tender; bowel sounds normal; no masses,  no organomegaly  Lab Results:  Recent Labs  05/22/14 0401 05/23/14 0457 05/24/14 0330  WBC 15.1* 13.1* 12.3*  HGB 12.0 12.2 12.4  HCT 37.5 37.9 37.2  PLT 325 386 384   BMET  Recent Labs  05/22/14 0401  NA 137  K 4.0  CL 106  CO2 23  GLUCOSE 172*  BUN 15  CREATININE 0.77  CALCIUM 8.8   LFT No results for input(s): PROT, ALBUMIN, AST, ALT, ALKPHOS, BILITOT, BILIDIR, IBILI in the last 72 hours. PT/INR No results for input(s): LABPROT, INR in the last 72 hours. Hepatitis Panel No results for input(s): HEPBSAG, HCVAB, HEPAIGM, HEPBIGM in the last 72 hours. C-Diff No results for input(s): CDIFFTOX in the last 72 hours. Fecal Lactopherrin No results for input(s): FECLLACTOFRN in the last 72 hours.  Studies/Results: No results found.  Medications:  Scheduled: . buPROPion  100 mg Oral QHS  . insulin aspart  0-9 Units Subcutaneous TID WC  . lamoTRIgine  200 mg Oral QHS  . piperacillin-tazobactam (ZOSYN)  IV  3.375 g Intravenous 3 times per day  . [START ON 05/26/2014] predniSONE  30 mg Oral QAC breakfast  . sodium chloride  3 mL Intravenous Q12H   Continuous:   Assessment/Plan: 1) Crohn's flare. 2) Intramural abscess.   I tried to explain the different approach to steroids multiple  times, but she has difficulty comprehending the situation.  Certainly, she will benefit with the lowest dosing of steroids, but I doubt that she was truly in remission with the prior steroid taper.  Again, the plan was to obtain a follow up CT scan, but she suffered an acute flare of symptoms when she was supposed to have the CT scan.  At that time she was still on steroids and the inflammation looks worse.  The plan is for her to be on a slower steroid taper and prolonged antibiotic treatment.  Once the abscesses have resolved, I will start her on Humira.  Her TB Gold and HBV serologies are negative.  Plan: 1) Okay to D/C Home today. 2) Maintain prednisone 30 mg QD.  I will manage her taper as an outpatient. 3) Start on Cipro 500 mg BID and Flagyl 500 mg TID.  Please provide her a month supply. 4) I will arrange for an outpatient CT scan later this week. 5) Follow up in the office in two weeks.  LOS: 5 days   Xcaret Morad D 05/24/2014, 7:30 AM

## 2014-05-24 NOTE — Progress Notes (Signed)
  Subjective: Feels well, wants to go home, confused but better since she discussed plan with Dr Benson Norway, no real abd pain ,tol diet  Objective: Vital signs in last 24 hours: Temp:  [97.9 F (36.6 C)-98.4 F (36.9 C)] 98.4 F (36.9 C) (01/11 0639) Pulse Rate:  [59-63] 63 (01/11 0639) Resp:  [16] 16 (01/11 0639) BP: (107-120)/(54-62) 120/62 mmHg (01/11 0639) SpO2:  [99 %-100 %] 99 % (01/11 0639) Weight:  [227 lb 11.8 oz (103.3 kg)] 227 lb 11.8 oz (103.3 kg) (01/11 0639) Last BM Date: 05/21/14  Intake/Output from previous day: 01/10 0701 - 01/11 0700 In: 1605 [P.O.:1455; IV Piggyback:150] Out: 550 [Urine:550] Intake/Output this shift:    GI: soft nt healed incision bs present  Lab Results:   Recent Labs  05/23/14 0457 05/24/14 0330  WBC 13.1* 12.3*  HGB 12.2 12.4  HCT 37.9 37.2  PLT 386 384   BMET  Recent Labs  05/22/14 0401  NA 137  K 4.0  CL 106  CO2 23  GLUCOSE 172*  BUN 15  CREATININE 0.77  CALCIUM 8.8   PT/INR No results for input(s): LABPROT, INR in the last 72 hours. ABG No results for input(s): PHART, HCO3 in the last 72 hours.  Invalid input(s): PCO2, PO2  Studies/Results: No results found.  Anti-infectives: Anti-infectives    Start     Dose/Rate Route Frequency Ordered Stop   05/20/14 0030  piperacillin-tazobactam (ZOSYN) IVPB 3.375 g     3.375 g12.5 mL/hr over 240 Minutes Intravenous 3 times per day 05/20/14 0025     05/20/14 0000  metroNIDAZOLE (FLAGYL) IVPB 500 mg  Status:  Discontinued     500 mg100 mL/hr over 60 Minutes Intravenous Every 8 hours 05/19/14 2352 05/20/14 0059      Assessment/Plan: crohns disease, intramural abscess  Agree with Dr Benson Norway, continue attempt at medical mgt given improvement  [facility] 05/24/2014

## 2014-05-24 NOTE — Care Management Note (Signed)
    Page 1 of 1   05/24/2014     5:14:39 PM CARE MANAGEMENT NOTE 05/24/2014  Patient:  April May, April May   Account Number:  [id number]  Date Initiated:  05/24/2014  Documentation initiated by:  Dyan Labarbera  Subjective/Objective Assessment:   Pt adm on 05/19/14 with Crohn's abscess.   PTA, pt resided at home with family and is independent.     Action/Plan:   Pt for dc home today.  No dc needs identified.   Anticipated DC Date:  05/24/2014   Anticipated DC Plan:  [place]  CM consult      Choice offered to / List presented to:             Status of service:  Completed, signed off Medicare Important Message given?  YES (If response is "NO", the following Medicare IM given date fields will be blank) Date Medicare IM given:  05/24/2014 Medicare IM given by:  Nadeen Shipman Date Additional Medicare IM given:   Additional Medicare IM given by:    Discharge Disposition:  HOME/SELF CARE  Per UR Regulation:  Reviewed for med. necessity/level of care/duration of stay  If discussed at [place] of Stay Meetings, dates discussed:    Comments:

## 2014-05-24 NOTE — Progress Notes (Signed)
D/C'd IVs x 2; D/C pt.  Pt was already non-telemetry.  Explained medications and discharge instructions to pt, and she had no further questions at this time.  Pt in no acute distress, and preferred to walk out independently.

## 2014-05-27 ENCOUNTER — Ambulatory Visit
Admission: RE | Admit: 2014-05-27 | Discharge: 2014-05-27 | Disposition: A | Discharge status: Home / Self Care | Payer: Medicare Other | Source: Ambulatory Visit | Attending: Gastroenterology | Admitting: Gastroenterology

## 2014-05-27 DIAGNOSIS — K50914 Crohn's disease, unspecified, with abscess: Secondary | ICD-10-CM | POA: Diagnosis not present

## 2014-05-27 DIAGNOSIS — R590 Localized enlarged lymph nodes: Secondary | ICD-10-CM | POA: Diagnosis not present

## 2014-05-27 MED ORDER — IOHEXOL 300 MG/ML  SOLN
125.0000 mL | Freq: Once | INTRAMUSCULAR | Status: AC | PRN
  Administered 2014-05-27: 125 mL via INTRAVENOUS

## 2014-06-09 ENCOUNTER — Encounter (HOSPITAL_COMMUNITY): Payer: Self-pay

## 2014-06-09 ENCOUNTER — Emergency Department (HOSPITAL_COMMUNITY)
Admission: EM | Admit: 2014-06-09 | Discharge: 2014-06-10 | Disposition: A | Discharge status: Home / Self Care | Payer: Medicare Other | Attending: Emergency Medicine | Admitting: Emergency Medicine

## 2014-06-09 DIAGNOSIS — Z720 Tobacco use: Secondary | ICD-10-CM | POA: Insufficient documentation

## 2014-06-09 DIAGNOSIS — Z9089 Acquired absence of other organs: Secondary | ICD-10-CM | POA: Insufficient documentation

## 2014-06-09 DIAGNOSIS — K5900 Constipation, unspecified: Secondary | ICD-10-CM | POA: Diagnosis not present

## 2014-06-09 DIAGNOSIS — F329 Major depressive disorder, single episode, unspecified: Secondary | ICD-10-CM | POA: Diagnosis not present

## 2014-06-09 DIAGNOSIS — E119 Type 2 diabetes mellitus without complications: Secondary | ICD-10-CM | POA: Diagnosis not present

## 2014-06-09 DIAGNOSIS — K7689 Other specified diseases of liver: Secondary | ICD-10-CM | POA: Diagnosis not present

## 2014-06-09 DIAGNOSIS — K651 Peritoneal abscess: Secondary | ICD-10-CM | POA: Diagnosis not present

## 2014-06-09 DIAGNOSIS — Z7952 Long term (current) use of systemic steroids: Secondary | ICD-10-CM | POA: Insufficient documentation

## 2014-06-09 DIAGNOSIS — R1084 Generalized abdominal pain: Secondary | ICD-10-CM | POA: Diagnosis not present

## 2014-06-09 DIAGNOSIS — Z79899 Other long term (current) drug therapy: Secondary | ICD-10-CM | POA: Insufficient documentation

## 2014-06-09 DIAGNOSIS — Z792 Long term (current) use of antibiotics: Secondary | ICD-10-CM | POA: Diagnosis not present

## 2014-06-09 DIAGNOSIS — I700 Atherosclerosis of aorta: Secondary | ICD-10-CM | POA: Diagnosis not present

## 2014-06-09 DIAGNOSIS — Z90710 Acquired absence of both cervix and uterus: Secondary | ICD-10-CM | POA: Insufficient documentation

## 2014-06-09 DIAGNOSIS — I10 Essential (primary) hypertension: Secondary | ICD-10-CM | POA: Insufficient documentation

## 2014-06-09 LAB — URINE MICROSCOPIC-ADD ON

## 2014-06-09 LAB — COMPREHENSIVE METABOLIC PANEL
ALBUMIN: 3.3 g/dL — AB (ref 3.5–5.2)
ALK PHOS: 46 U/L (ref 39–117)
ALT: 18 U/L (ref 0–35)
AST: 24 U/L (ref 0–37)
Anion gap: 10 (ref 5–15)
BUN: 11 mg/dL (ref 6–23)
CALCIUM: 9.3 mg/dL (ref 8.4–10.5)
CO2: 24 mmol/L (ref 19–32)
Chloride: 103 mmol/L (ref 96–112)
Creatinine, Ser: 0.85 mg/dL (ref 0.50–1.10)
GFR, EST AFRICAN AMERICAN: 86 mL/min — AB (ref >90)
GFR, EST NON AFRICAN AMERICAN: 74 mL/min — AB (ref >90)
GLUCOSE: 134 mg/dL — AB (ref 70–99)
Potassium: 3.6 mmol/L (ref 3.5–5.1)
Sodium: 137 mmol/L (ref 135–145)
Total Bilirubin: 0.6 mg/dL (ref 0.3–1.2)
Total Protein: 7.3 g/dL (ref 6.0–8.3)

## 2014-06-09 LAB — URINALYSIS, ROUTINE W REFLEX MICROSCOPIC
Bilirubin Urine: NEGATIVE
GLUCOSE, UA: NEGATIVE mg/dL
KETONES UR: 15 mg/dL — AB
Nitrite: POSITIVE — AB
PH: 5 (ref 5.0–8.0)
Protein, ur: NEGATIVE mg/dL
Urobilinogen, UA: 0.2 mg/dL (ref 0.0–1.0)

## 2014-06-09 LAB — CBC WITH DIFFERENTIAL/PLATELET
Basophils Absolute: 0 10*3/uL (ref 0.0–0.1)
Basophils Relative: 0 % (ref 0–1)
EOS ABS: 0.1 10*3/uL (ref 0.0–0.7)
Eosinophils Relative: 1 % (ref 0–5)
HCT: 43 % (ref 36.0–46.0)
Hemoglobin: 14.4 g/dL (ref 12.0–15.0)
Lymphocytes Relative: 13 % (ref 12–46)
Lymphs Abs: 1.2 10*3/uL (ref 0.7–4.0)
MCH: 31.3 pg (ref 26.0–34.0)
MCHC: 33.5 g/dL (ref 30.0–36.0)
MCV: 93.5 fL (ref 78.0–100.0)
MONO ABS: 1.3 10*3/uL — AB (ref 0.1–1.0)
Monocytes Relative: 13 % — ABNORMAL HIGH (ref 3–12)
NEUTROS PCT: 73 % (ref 43–77)
Neutro Abs: 7 10*3/uL (ref 1.7–7.7)
Platelets: 317 10*3/uL (ref 150–400)
RBC: 4.6 MIL/uL (ref 3.87–5.11)
RDW: 15.1 % (ref 11.5–15.5)
WBC: 9.6 10*3/uL (ref 4.0–10.5)

## 2014-06-09 MED ORDER — IOHEXOL 300 MG/ML  SOLN
25.0000 mL | Freq: Once | INTRAMUSCULAR | Status: AC | PRN
Start: 2014-06-09 — End: 2014-06-09
  Administered 2014-06-09: 25 mL via ORAL

## 2014-06-09 MED ORDER — IOHEXOL 300 MG/ML  SOLN
25.0000 mL | Freq: Once | INTRAMUSCULAR | Status: AC | PRN
  Administered 2014-06-09: 25 mL via ORAL

## 2014-06-09 MED ORDER — HYDROMORPHONE HCL 1 MG/ML IJ SOLN
0.5000 mg | INTRAMUSCULAR | Status: AC
  Administered 2014-06-09: 0.5 mg via INTRAVENOUS
  Filled 2014-06-09: qty 1

## 2014-06-09 MED ORDER — SODIUM CHLORIDE 0.9 % IV BOLUS (SEPSIS)
500.0000 mL | Freq: Once | INTRAVENOUS | Status: AC
  Administered 2014-06-09: 500 mL via INTRAVENOUS

## 2014-06-09 NOTE — ED Notes (Signed)
Pt PCP has been adjusting dose of prednisone to manage symptoms but pt states that "this has not been effective and she needs relief."

## 2014-06-09 NOTE — ED Provider Notes (Addendum)
CSN: [id number]     Arrival date & time 06/09/14  1812 History   First MD Initiated Contact with Patient 06/09/14 2047     Chief Complaint  Patient presents with  . Abdominal Pain     (Consider location/radiation/quality/duration/timing/severity/associated sxs/prior Treatment) Patient is a 59 y.o. female presenting with abdominal pain. The history is provided by the patient.  Abdominal Pain Pain location:  Generalized Pain quality: aching   Pain radiates to:  Does not radiate Pain severity:  Mild Onset quality:  Gradual Timing:  Intermittent Progression:  Unchanged Chronicity:  Chronic Context comment:  Crohn's and known intra-abdominal abscesses Relieved by:  OTC medications Worsened by:  Nothing tried Ineffective treatments:  None tried Associated symptoms: constipation   Associated symptoms: no chest pain, no cough, no diarrhea, no dysuria, no fatigue, no fever, no hematuria, no nausea, no shortness of breath and no vomiting     Past Medical History  Diagnosis Date  . Allergy   . Depression   . Hypertension   . Crohn disease   . Diabetes mellitus type II    Past Surgical History  Procedure Laterality Date  . Appendectomy    . Abdominal hysterectomy    . Small intestine surgery    . Breast surgery     Family History  Problem Relation Age of Onset  . Hypertension Mother   . Arthritis Father   . Alcohol abuse Paternal Uncle   . Diabetes Paternal Grandmother   . Stroke Paternal Grandmother    History  Substance Use Topics  . Smoking status: Current Every Day Smoker -- 1.00 packs/day for 35 years    Types: Cigarettes  . Smokeless tobacco: Never Used  . Alcohol Use: No   OB History    No data available     Review of Systems  Constitutional: Negative for fever and fatigue.  HENT: Negative for congestion and drooling.   Eyes: Negative for pain.  Respiratory: Negative for cough and shortness of breath.   Cardiovascular: Negative for chest pain.   Gastrointestinal: Positive for abdominal pain and constipation. Negative for nausea, vomiting and diarrhea.  Genitourinary: Negative for dysuria and hematuria.  Musculoskeletal: Negative for back pain, gait problem and neck pain.  Skin: Negative for color change.  Neurological: Negative for dizziness and headaches.  Hematological: Negative for adenopathy.  Psychiatric/Behavioral: Negative for behavioral problems.  All other systems reviewed and are negative.     Allergies  Lisinopril; Crestor; Lipitor; and Azathioprine  Home Medications   Prior to Admission medications   Medication Sig Start Date End Date Taking? Authorizing Provider  AMITIZA 24 MCG capsule Take 24 mcg by mouth at bedtime. 03/19/14  Yes Historical Provider, MD  buPROPion (WELLBUTRIN SR) 100 MG 12 hr tablet Take 100 mg by mouth at bedtime.   Yes Historical Provider, MD  ciprofloxacin (CIPRO) 500 MG tablet Take 1 tablet (500 mg total) by mouth 2 (two) times daily. 05/24/14  Yes Orson Eva, MD  fluticasone (FLONASE) 50 MCG/ACT nasal spray Place 2 sprays into both nostrils daily as needed for allergies or rhinitis. 09/14/13  Yes Janith Lima, MD  lamoTRIgine (LAMICTAL) 200 MG tablet Take 200 mg by mouth at bedtime.   Yes Historical Provider, MD  metroNIDAZOLE (FLAGYL) 500 MG tablet Take 1 tablet (500 mg total) by mouth 3 (three) times daily. 05/24/14  Yes Orson Eva, MD  predniSONE (DELTASONE) 10 MG tablet Take 3 tablets (30 mg total) by mouth daily with breakfast. 05/24/14  Yes Shanon Brow  Tat, MD  zolpidem (AMBIEN) 10 MG tablet Take 10 mg by mouth at bedtime. 05/24/14  Yes Historical Provider, MD   BP 120/68 mmHg  Pulse 79  Temp(Src) 99.3 F (37.4 C) (Oral)  Resp 24  Ht 5\' 1"  (1.549 m)  Wt 220 lb (99.791 kg)  BMI 41.59 kg/m2  SpO2 97%  LMP 05/14/1988 Physical Exam  Constitutional: She is oriented to person, place, and time. She appears well-developed and well-nourished.  HENT:  Head: Normocephalic.  Mouth/Throat:  Oropharynx is clear and moist. No oropharyngeal exudate.  Eyes: Conjunctivae and EOM are normal. Pupils are equal, round, and reactive to light.  Neck: Normal range of motion. Neck supple.  Cardiovascular: Normal rate, regular rhythm, normal heart sounds and intact distal pulses.  Exam reveals no gallop and no friction rub.   No murmur heard. Pulmonary/Chest: Effort normal and breath sounds normal. No respiratory distress. She has no wheezes.  Abdominal: Soft. Bowel sounds are normal. There is tenderness (mild nonspecific diffuse tenderness to palpation.). There is no rebound and no guarding.  Musculoskeletal: Normal range of motion. She exhibits no edema or tenderness.  Neurological: She is alert and oriented to person, place, and time.  Skin: Skin is warm and dry.  Psychiatric: She has a normal mood and affect. Her behavior is normal.  Nursing note and vitals reviewed.   ED Course  Procedures (including critical care time) Labs Review Labs Reviewed  CBC WITH DIFFERENTIAL/PLATELET - Abnormal; Notable for the following:    Monocytes Relative 13 (*)    Monocytes Absolute 1.3 (*)    All other components within normal limits  COMPREHENSIVE METABOLIC PANEL - Abnormal; Notable for the following:    Glucose, Bld 134 (*)    Albumin 3.3 (*)    GFR calc non Af Amer 74 (*)    GFR calc Af Amer 86 (*)    All other components within normal limits  URINALYSIS, ROUTINE W REFLEX MICROSCOPIC - Abnormal; Notable for the following:    APPearance CLOUDY (*)    Specific Gravity, Urine >1.030 (*)    Hgb urine dipstick TRACE (*)    Ketones, ur 15 (*)    Nitrite POSITIVE (*)    Leukocytes, UA TRACE (*)    All other components within normal limits  URINE MICROSCOPIC-ADD ON - Abnormal; Notable for the following:    Squamous Epithelial / LPF MANY (*)    Bacteria, UA FEW (*)    Casts HYALINE CASTS (*)    All other components within normal limits  URINE CULTURE    Imaging Review Ct Abdomen Pelvis W  Contrast  06/10/2014   CLINICAL DATA:  Acute onset of generalized abdominal pain, worse on the right. Known abdominal abscesses, on antibiotics. Current history of Crohn's disease. Subsequent encounter.  EXAM: CT ABDOMEN AND PELVIS WITH CONTRAST  TECHNIQUE: Multidetector CT imaging of the abdomen and pelvis was performed using the standard protocol following bolus administration of intravenous contrast.  CONTRAST:  160mL OMNIPAQUE IOHEXOL 300 MG/ML  SOLN  COMPARISON:  CT of the abdomen and pelvis performed 05/27/2014  FINDINGS: The visualized lung bases are clear.  A stable 7 mm hypodensity within the right hepatic lobe likely reflects a small cyst, unchanged from multiple prior studies. The spleen is unremarkable. The gallbladder is within normal limits. The pancreas and adrenal glands are unremarkable.  The kidneys are unremarkable in appearance. There is no evidence of hydronephrosis. No renal or ureteral stones are seen. No perinephric stranding is appreciated.  There has been gradual interval improvement in the patient's complex loculated abscess at the right lower quadrant. It now measures approximately 7.3 x 1.7 x 3.8 cm, with several small loculations. It contains scattered tiny foci of air, and there is significant surrounding soft tissue inflammation, improved from prior studies. Associated phlegmon is somewhat better defined on the current study. There is scattered wall thickening and inflammation involving the adjacent distal ileum, which is interspersed around the loculated collections of fluid. Wall thickening extends to the level of the terminal ileum. The cecum is unremarkable in appearance. Scattered mildly prominent adjacent mesenteric nodes are seen, measuring up to 1.0 cm short axis.  Contrast progresses through the inflamed segment of ileum to the level of the hepatic flexure of the colon. The colon is otherwise unremarkable. The patient is status post appendectomy.  The remaining small bowel  is unremarkable in appearance. The stomach is within normal limits. No acute vascular abnormalities are seen. Mild scattered calcification is noted along the distal abdominal aorta and its branches.  The bladder is mildly distended and grossly unremarkable. The patient is status post hysterectomy. No suspicious adnexal masses are seen. No inguinal lymphadenopathy is seen.  No acute osseous abnormalities are identified.  IMPRESSION: 1. Gradual interval improvement in complex loculated abscess at the right lower quadrant. It now measures approximately 7.3 x 1.7 x 3.8 cm, with several small loculations and scattered tiny foci of air. Significant surrounding soft tissue inflammation is improved from prior studies, though associated phlegmon is somewhat better defined on the current study. 2. The adjacent distal ileum demonstrate scattered wall thickening and inflammation, and is interspersed around the loculated collections of fluid. Wall thickening extends to the level of the terminal ileum, and mildly prominent adjacent mesenteric nodes are seen. 3. Mild scattered calcification along the distal abdominal aorta and its branches. 4. Stable tiny right hepatic cyst.   Electronically Signed   By: Garald Balding M.D.   On: 06/10/2014 01:48     EKG Interpretation None      MDM   Final diagnoses:  Abdominal pain, generalized    9:17 PM 59 y.o. female with a history of Crohn's and known intra-abdominal abscesses on antibiotics who presents with ongoing abdominal pain. She notes that her pain is not worsening but remains unchanged. She feels like she is not getting any better. She denies any fevers, vomiting, or diarrhea. She rates her pain a 4-5 out of 10 currently. She states that she has some constipation but is able to have bowel movements with the use of medications and laxatives. Last bowel movement was 1-2 days ago. She is afebrile and vital signs are unremarkable here. Lab work is thus far noncontributory.  She has been seen by surgery previously noted that she was not a surgical candidate. Will get CT of abdomen and symptom control.  UA is nitrite pos. Pt currently on Cipro/flagyl w/out elev wbc, no urinary sx, and a known cause of her abd pain. Will defer tx for now and await urine cx.   Transferred care to Dr. Roxanne Mins while awaiting CT abd.   Pamella Pert, MD 06/10/14 [place], MD 06/10/14 1136

## 2014-06-09 NOTE — ED Notes (Signed)
MD at bedside. 

## 2014-06-09 NOTE — ED Notes (Signed)
CT called to inquire about scan.

## 2014-06-09 NOTE — ED Notes (Signed)
Pt reporting abdominal pain d/t Crohn's.  Sts she has been seen here multiple times for same but still has not gotten any relief from pain.  Pt sts abdomen feels sore.  Pain 5/10. Denies nvd, sts she has had some constipation.

## 2014-06-10 ENCOUNTER — Other Ambulatory Visit: Payer: Self-pay | Admitting: Gastroenterology

## 2014-06-10 ENCOUNTER — Encounter (HOSPITAL_COMMUNITY): Payer: Self-pay

## 2014-06-10 ENCOUNTER — Emergency Department (HOSPITAL_COMMUNITY): Payer: Medicare Other

## 2014-06-10 DIAGNOSIS — I700 Atherosclerosis of aorta: Secondary | ICD-10-CM | POA: Diagnosis not present

## 2014-06-10 DIAGNOSIS — K651 Peritoneal abscess: Secondary | ICD-10-CM | POA: Diagnosis not present

## 2014-06-10 DIAGNOSIS — K50114 Crohn's disease of large intestine with abscess: Secondary | ICD-10-CM

## 2014-06-10 DIAGNOSIS — K7689 Other specified diseases of liver: Secondary | ICD-10-CM | POA: Diagnosis not present

## 2014-06-10 MED ORDER — IOHEXOL 300 MG/ML  SOLN
100.0000 mL | Freq: Once | INTRAMUSCULAR | Status: AC | PRN
  Administered 2014-06-10: 100 mL via INTRAVENOUS

## 2014-06-10 NOTE — ED Notes (Signed)
CT notified that pt is finished with contrast.  

## 2014-06-10 NOTE — Discharge Instructions (Signed)
Continue your current treatment for Crohn's disease. Follow-up with your primary care physician and with your gastroenterologist. Return if symptoms worsen.

## 2014-06-10 NOTE — ED Provider Notes (Signed)
Patient initially seen and evaluated by Dr. Aline Brochure with abdominal pain and history of Crohn's disease. She had been sent for CT scan. CT has come back showing decrease in size of her abscesses and decreased inflammation. Patient was advised of these findings. She is reevaluated and abdomen is found to have only mild tenderness in the epigastric area. Patient is advised to continue current treatment and follow-up with her gastroenterologist.  Results for orders placed or performed during the hospital encounter of 06/09/14  CBC with Differential  Result Value Ref Range   WBC 9.6 4.0 - 10.5 K/uL   RBC 4.60 3.87 - 5.11 MIL/uL   Hemoglobin 14.4 12.0 - 15.0 g/dL   HCT 43.0 36.0 - 46.0 %   MCV 93.5 78.0 - 100.0 fL   MCH 31.3 26.0 - 34.0 pg   MCHC 33.5 30.0 - 36.0 g/dL   RDW 15.1 11.5 - 15.5 %   Platelets 317 150 - 400 K/uL   Neutrophils Relative % 73 43 - 77 %   Neutro Abs 7.0 1.7 - 7.7 K/uL   Lymphocytes Relative 13 12 - 46 %   Lymphs Abs 1.2 0.7 - 4.0 K/uL   Monocytes Relative 13 (H) 3 - 12 %   Monocytes Absolute 1.3 (H) 0.1 - 1.0 K/uL   Eosinophils Relative 1 0 - 5 %   Eosinophils Absolute 0.1 0.0 - 0.7 K/uL   Basophils Relative 0 0 - 1 %   Basophils Absolute 0.0 0.0 - 0.1 K/uL  Comprehensive metabolic panel  Result Value Ref Range   Sodium 137 135 - 145 mmol/L   Potassium 3.6 3.5 - 5.1 mmol/L   Chloride 103 96 - 112 mmol/L   CO2 24 19 - 32 mmol/L   Glucose, Bld 134 (H) 70 - 99 mg/dL   BUN 11 6 - 23 mg/dL   Creatinine, Ser 0.85 0.50 - 1.10 mg/dL   Calcium 9.3 8.4 - 10.5 mg/dL   Total Protein 7.3 6.0 - 8.3 g/dL   Albumin 3.3 (L) 3.5 - 5.2 g/dL   AST 24 0 - 37 U/L   ALT 18 0 - 35 U/L   Alkaline Phosphatase 46 39 - 117 U/L   Total Bilirubin 0.6 0.3 - 1.2 mg/dL   GFR calc non Af Amer 74 (L) >90 mL/min   GFR calc Af Amer 86 (L) >90 mL/min   Anion gap 10 5 - 15  Urinalysis, Routine w reflex microscopic  Result Value Ref Range   Color, Urine YELLOW YELLOW   APPearance CLOUDY (A)  CLEAR   Specific Gravity, Urine >1.030 (H) 1.005 - 1.030   pH 5.0 5.0 - 8.0   Glucose, UA NEGATIVE NEGATIVE mg/dL   Hgb urine dipstick TRACE (A) NEGATIVE   Bilirubin Urine NEGATIVE NEGATIVE   Ketones, ur 15 (A) NEGATIVE mg/dL   Protein, ur NEGATIVE NEGATIVE mg/dL   Urobilinogen, UA 0.2 0.0 - 1.0 mg/dL   Nitrite POSITIVE (A) NEGATIVE   Leukocytes, UA TRACE (A) NEGATIVE  Urine microscopic-add on  Result Value Ref Range   Squamous Epithelial / LPF MANY (A) RARE   WBC, UA 0-2 <3 WBC/hpf   RBC / HPF 0-2 <3 RBC/hpf   Bacteria, UA FEW (A) RARE   Casts HYALINE CASTS (A) NEGATIVE   Urine-Other MUCOUS PRESENT    Ct Abdomen Pelvis W Contrast  06/10/2014   CLINICAL DATA:  Acute onset of generalized abdominal pain, worse on the right. Known abdominal abscesses, on antibiotics. Current history of Crohn's  disease. Subsequent encounter.  EXAM: CT ABDOMEN AND PELVIS WITH CONTRAST  TECHNIQUE: Multidetector CT imaging of the abdomen and pelvis was performed using the standard protocol following bolus administration of intravenous contrast.  CONTRAST:  125m OMNIPAQUE IOHEXOL 300 MG/ML  SOLN  COMPARISON:  CT of the abdomen and pelvis performed 05/27/2014  FINDINGS: The visualized lung bases are clear.  A stable 7 mm hypodensity within the right hepatic lobe likely reflects a small cyst, unchanged from multiple prior studies. The spleen is unremarkable. The gallbladder is within normal limits. The pancreas and adrenal glands are unremarkable.  The kidneys are unremarkable in appearance. There is no evidence of hydronephrosis. No renal or ureteral stones are seen. No perinephric stranding is appreciated.  There has been gradual interval improvement in the patient's complex loculated abscess at the right lower quadrant. It now measures approximately 7.3 x 1.7 x 3.8 cm, with several small loculations. It contains scattered tiny foci of air, and there is significant surrounding soft tissue inflammation, improved from  prior studies. Associated phlegmon is somewhat better defined on the current study. There is scattered wall thickening and inflammation involving the adjacent distal ileum, which is interspersed around the loculated collections of fluid. Wall thickening extends to the level of the terminal ileum. The cecum is unremarkable in appearance. Scattered mildly prominent adjacent mesenteric nodes are seen, measuring up to 1.0 cm short axis.  Contrast progresses through the inflamed segment of ileum to the level of the hepatic flexure of the colon. The colon is otherwise unremarkable. The patient is status post appendectomy.  The remaining small bowel is unremarkable in appearance. The stomach is within normal limits. No acute vascular abnormalities are seen. Mild scattered calcification is noted along the distal abdominal aorta and its branches.  The bladder is mildly distended and grossly unremarkable. The patient is status post hysterectomy. No suspicious adnexal masses are seen. No inguinal lymphadenopathy is seen.  No acute osseous abnormalities are identified.  IMPRESSION: 1. Gradual interval improvement in complex loculated abscess at the right lower quadrant. It now measures approximately 7.3 x 1.7 x 3.8 cm, with several small loculations and scattered tiny foci of air. Significant surrounding soft tissue inflammation is improved from prior studies, though associated phlegmon is somewhat better defined on the current study. 2. The adjacent distal ileum demonstrate scattered wall thickening and inflammation, and is interspersed around the loculated collections of fluid. Wall thickening extends to the level of the terminal ileum, and mildly prominent adjacent mesenteric nodes are seen. 3. Mild scattered calcification along the distal abdominal aorta and its branches. 4. Stable tiny right hepatic cyst.   Electronically Signed   By: JGarald BaldingM.D.   On: 06/10/2014 01:48   Ct Abdomen Pelvis W Contrast  05/27/2014    CLINICAL DATA:  Patient with history of Crohn's disease. Follow-up abscess.  EXAM: CT ABDOMEN AND PELVIS WITH CONTRAST  TECHNIQUE: Multidetector CT imaging of the abdomen and pelvis was performed using the standard protocol following bolus administration of intravenous contrast.  CONTRAST:  1223mOMNIPAQUE IOHEXOL 300 MG/ML  SOLN  COMPARISON:  CT 05/19/2014; 04/05/2014.  FINDINGS: Lower chest: Visualization of the lower thorax demonstrates dependent subpleural atelectasis. Normal heart size. Small hiatal hernia.  Hepatobiliary: Liver is normal in size and contour. Unchanged sub cm low-attenuation lesion hepatic dome. Gallbladder is unremarkable. Portal veins patent. No intrahepatic or extrahepatic biliary ductal dilatation.  Pancreas: Unremarkable  Spleen: Unremarkable  Adrenals/Urinary Tract: Normal adrenal glands. Kidneys enhance symmetrically with contrast. No hydronephrosis.  The urinary bladder is unremarkable.  Stomach/Bowel: Re- demonstrated multiple inflamed loops of small bowel within the right lower quadrant. There is an interloop abscess which measures approximately 7.6 x 4.4 cm. Overall this is grossly similar in appearance when compared to recent prior examination were it measured 7.6 x 3.4 cm when measuring in the same plane. Postsurgical changes involving enteric colonic junction.  Vascular/Lymphatic: Normal caliber abdominal aorta. Scattered calcified atherosclerotic plaque. Multiple prominent and enlarged mesenteric lymph nodes are demonstrated including a 12 mm small bowel mesenteric lymph node (image 44; series 3).  Other: None  Musculoskeletal: No aggressive or acute appearing osseous lesions.  IMPRESSION: When compared to recent prior examination there is a grossly stable rim enhancing fluid collection involving the small bowel within the right lower quadrant, most compatible with interloop abscess. Overall findings are likely sequelae of patient's known diagnosis of Crohn's enteritis.  Multiple  prominent and enlarged mesenteric lymph nodes, likely reactive in etiology.   Electronically Signed   By: Lovey Newcomer M.D.   On: 05/27/2014 11:19   Ct Abdomen Pelvis W Contrast  05/19/2014   CLINICAL DATA:  Initial evaluation for acute abdominal pain.  EXAM: CT ABDOMEN AND PELVIS WITH CONTRAST  TECHNIQUE: Multidetector CT imaging of the abdomen and pelvis was performed using the standard protocol following bolus administration of intravenous contrast.  CONTRAST:  138m OMNIPAQUE IOHEXOL 300 MG/ML SOLN, 271mOMNIPAQUE IOHEXOL 300 MG/ML SOLN  COMPARISON:  Prior study from 04/05/2014.  FINDINGS: The visualized lung bases are clear. No pleural or pericardial effusion.  Subcentimeter hypodensity within the left hepatic lobe noted. This is too small the characterize by CT, but may reflect a small cysts, and is stable from prior study. The liver is otherwise unremarkable. Gallbladder within normal limits. No biliary dilatation. The spleen, adrenal glands, and pancreas demonstrate a normal contrast enhanced appearance.  Kidneys are equal size with symmetric enhancement. No nephrolithiasis, hydronephrosis, or focal enhancing renal mass.  Small hiatal hernia noted. Stomach otherwise unremarkable. Sequela of prior ileocolic resection with primary reanastomosis again seen.  There is a cluster of markedly inflamed loops of small bowel within the right mid abdomen closely approximated to the surgical anastomosis. There is a somewhat ill-defined hypodense fluid collection 5.3 x 3.5 x 3.6 cm within this region (series 201, image 51). Additional hypodense collection located more anteriorly and medially measures approximately 1.7 x 2.3 x 3.2 cm (series 2001, image 51). These 2 collections may be contiguous with one another. While these may reflect on opacified loops of bowel, the irregular peripheral enhancement about these collections raises the possibility of intra-abdominal abscesses. Overall, the degree of inflammation and  phlegmonous change has worsened as compared to most recent CT from 04/05/2014.  Remainder of the bowel is unremarkable without evidence of obstruction or inflammation.  Bladder within normal limits.  Uterus and ovaries are absent.  No free air. Small volume free fluid present within the pelvis, which is likely reactive in nature.  Scattered mesenteric lymph nodes are similar relative to prior study. No pathologic Lee enlarged intra-abdominal pelvic lymph nodes identified.  Moderate aorto bi-iliac atherosclerotic disease present. No aneurysm.  No acute osseous abnormality. No worrisome lytic or blastic osseous lesions.  IMPRESSION: Multiple clustered inflamed loops of bowel within the right abdomen, likely related to known history of Crohn's disease. Several hypodense collections impregnated within this area of inflammation are again seen, the largest of which measures approximately 5.3 x 3.5 x 3.6 cm. While these may reflect unopacified loops of  bowel, findings are again suspicious for possible associated intra-abdominal abscesses. Overall, the degree of inflammation and phlegmonous change has slightly worsened relative to most recent CT from 04/05/2014. No free air or associated obstruction.   Electronically Signed   By: Jeannine Boga M.D.   On: 05/19/2014 56:38      Delora Fuel, MD [id number]

## 2014-06-11 LAB — URINE CULTURE
COLONY COUNT: NO GROWTH
Culture: NO GROWTH

## 2014-06-16 ENCOUNTER — Other Ambulatory Visit: Payer: Medicare Other

## 2014-06-23 DIAGNOSIS — R933 Abnormal findings on diagnostic imaging of other parts of digestive tract: Secondary | ICD-10-CM | POA: Diagnosis not present

## 2014-06-23 DIAGNOSIS — F17200 Nicotine dependence, unspecified, uncomplicated: Secondary | ICD-10-CM | POA: Diagnosis not present

## 2014-06-23 DIAGNOSIS — K50814 Crohn's disease of both small and large intestine with abscess: Secondary | ICD-10-CM | POA: Diagnosis not present

## 2014-06-25 ENCOUNTER — Telehealth: Payer: Self-pay

## 2014-06-25 NOTE — Telephone Encounter (Signed)
Pt stated that she was not sure if she has had a flu shot. Pt stated that she will call back.

## 2014-06-30 DIAGNOSIS — K50814 Crohn's disease of both small and large intestine with abscess: Secondary | ICD-10-CM | POA: Diagnosis not present

## 2014-06-30 DIAGNOSIS — F17200 Nicotine dependence, unspecified, uncomplicated: Secondary | ICD-10-CM | POA: Diagnosis not present

## 2014-06-30 DIAGNOSIS — R933 Abnormal findings on diagnostic imaging of other parts of digestive tract: Secondary | ICD-10-CM | POA: Diagnosis not present

## 2014-08-09 ENCOUNTER — Other Ambulatory Visit: Payer: Self-pay | Admitting: Gastroenterology

## 2014-08-09 DIAGNOSIS — K50914 Crohn's disease, unspecified, with abscess: Secondary | ICD-10-CM

## 2014-08-12 ENCOUNTER — Other Ambulatory Visit: Payer: Self-pay | Admitting: Gastroenterology

## 2014-08-13 ENCOUNTER — Ambulatory Visit (HOSPITAL_COMMUNITY): Admission: RE | Admit: 2014-08-13 | Payer: Medicare Other | Source: Ambulatory Visit

## 2014-08-14 ENCOUNTER — Other Ambulatory Visit: Payer: Self-pay | Admitting: Internal Medicine

## 2014-09-13 DIAGNOSIS — K50814 Crohn's disease of both small and large intestine with abscess: Secondary | ICD-10-CM | POA: Diagnosis not present

## 2014-09-13 DIAGNOSIS — K5090 Crohn's disease, unspecified, without complications: Secondary | ICD-10-CM | POA: Diagnosis not present

## 2014-09-15 ENCOUNTER — Ambulatory Visit (HOSPITAL_COMMUNITY)
Admission: RE | Admit: 2014-09-15 | Discharge: 2014-09-15 | Disposition: A | Discharge status: Home / Self Care | Payer: Medicare Other | Source: Ambulatory Visit | Attending: Gastroenterology | Admitting: Gastroenterology

## 2014-09-15 ENCOUNTER — Encounter (HOSPITAL_COMMUNITY): Payer: Self-pay

## 2014-09-15 DIAGNOSIS — R1903 Right lower quadrant abdominal swelling, mass and lump: Secondary | ICD-10-CM | POA: Diagnosis not present

## 2014-09-15 DIAGNOSIS — K5090 Crohn's disease, unspecified, without complications: Secondary | ICD-10-CM | POA: Diagnosis not present

## 2014-09-15 DIAGNOSIS — I880 Nonspecific mesenteric lymphadenitis: Secondary | ICD-10-CM | POA: Diagnosis not present

## 2014-09-15 DIAGNOSIS — K50914 Crohn's disease, unspecified, with abscess: Secondary | ICD-10-CM | POA: Diagnosis not present

## 2014-09-15 MED ORDER — IOHEXOL 300 MG/ML  SOLN
100.0000 mL | Freq: Once | INTRAMUSCULAR | Status: AC | PRN
  Administered 2014-09-15: 100 mL via INTRAVENOUS

## 2014-10-15 ENCOUNTER — Ambulatory Visit: Payer: Self-pay | Admitting: Surgery

## 2014-10-15 DIAGNOSIS — K50814 Crohn's disease of both small and large intestine with abscess: Secondary | ICD-10-CM | POA: Diagnosis not present

## 2014-10-15 NOTE — H&P (Signed)
History of Present Illness April May. April Schnapp MD; 10/15/2014 2:06 PM) Patient words: crohns disease.  The patient is a 59 year old female who presents with crohn's disease. Referred by Dr. Carol Ada for evaluation for possible bowel resection.  This is a 59 yo female with a long history of Crohns' disease s/p bowel resection about 30 years ago by Dr. Lennie Hummer. Since that time, she has had minimal problems with flare-ups of her Crohns', but recently she has developed some intermittent RLQ abdominal pain. In January of 2016, she presented with RLQ abdominal pain and was found to have a mass of inflamed small bowel with possible phlegmon/ intraloop abscesses. She was treated aggressively by Dr. Benson Norway, but a follow-up CT scan in May shows improvement, but a persistence of the inflamed small bowel in the RLQ. She is unable to tolerate Azathioprine because of previous pancreatitis. He would like to consider using Humira, but is hesitant to treat her with this with active inflammation and possible intra-abdominal chronic abscess. She is currently minimally symptomatic.    CLINICAL DATA: Crohn's disease. Followup abscess.  EXAM: CT ABDOMEN AND PELVIS WITH CONTRAST  TECHNIQUE: Multidetector CT imaging of the abdomen and pelvis was performed using the standard protocol following bolus administration of intravenous contrast.  CONTRAST: 139m OMNIPAQUE IOHEXOL 300 MG/ML SOLN  COMPARISON: 06/10/2014  FINDINGS: Lower chest: The lung bases are grossly clear. No pleural effusion. Small hiatal hernia.  Hepatobiliary: No focal hepatic lesions or intrahepatic biliary dilatation. Stable small low-attenuation lesions near the dome are likely cysts. The gallbladder is normal. No common bile duct dilatation.  Pancreas: Normal  Spleen: Normal  Adrenals/Urinary Tract: Stable mild nodularity of the adrenal glands. The kidneys are normal and stable.  Stomach/Bowel: The stomach, duodenum and  proximal small bowel loops are normal. The distal loops of ileum are clumped with a phlegmonous mass in the right lower quadrant/upper right pelvis. Small central fluid collections with rim enhancement are noted consistent with small abscesses. These are significantly smaller when compared to the prior CT scan. The largest measures 18.5 mm on image number 56. No small bowel obstruction. The terminal ileum demonstrates diffuse wall thickening. The colon is unremarkable.  Vascular/Lymphatic: Persistent numerous borderline enlarged mesenteric and retroperitoneal lymph nodes. No interval change. Stable advanced atherosclerotic calcifications involving the aorta. The major venous structures are patent.  Other: The uterus is surgically absent. The bladder is grossly normal. No pelvic mass or free pelvic fluid collections. No inguinal mass or adenopathy.  Musculoskeletal: Stable SI joint and pubic symphysis degenerative changes. No significant bony findings.  IMPRESSION: 1. Persistent phlegmonous mass in the right lower quadrant and upper pelvis surrounding matted and clumped distal small bowel loops. Small persistent fluid collections but overall the abscesses are much smaller. 2. Moderate wall thickening of the terminal ileum. No obstructive findings. 3. Stable borderline enlarged mesenteric and retroperitoneal lymph nodes.   Electronically Signed By: PMarijo SanesM.D. On: 09/15/2014 13:09 Other Problems (Elbert Ewings CMA; 10/15/2014 10:01 AM) Crohn's Disease Depression Diabetes Mellitus High blood pressure  Past Surgical History (Elbert Ewings CMA; 10/15/2014 10:01 AM) Cesarean Section - 1 Resection of Small Bowel  Diagnostic Studies History (Elbert Ewings CMA; 10/15/2014 10:01 AM) Colonoscopy 1-5 years ago Mammogram 1-3 years ago  Allergies (Elbert Ewings CMA; 10/15/2014 10:02 AM) Lisinopril *CHEMICALS* Crestor *ANTIHYPERLIPIDEMICS* Lipitor  *ANTIHYPERLIPIDEMICS* AzaTHIOprine *ASSORTED CLASSES*  Medication History (Elbert Ewings CMA; 10/15/2014 10:04 AM) Amitiza (24MCG Capsule, Oral) Active. Wellbutrin SR (100MG Tablet ER 12HR, Oral) Active. LaMICtal ODT (200MG  Tablet Disperse, Oral) Active. Flonase (50MCG/ACT Suspension, Nasal) Active. Ambien (10MG Tablet, Oral) Active. Medications Reconciled  Social History Elbert Ewings, [place]; 10/15/2014 10:01 AM) Caffeine use Coffee. Tobacco use Current every day smoker.  Family History Elbert Ewings, [place]; 10/15/2014 10:01 AM) Arthritis Father. Cerebrovascular Accident Mother. Depression Mother, Sister. Diabetes Mellitus Father, Sister. Heart Disease Mother. Hypertension Daughter, Father, Mother. Ischemic Bowel Disease Mother. Kidney Disease Mother. Seizure disorder Mother.  Pregnancy / Birth History Elbert Ewings, CMA; 10/15/2014 10:01 AM) Age at menarche 76 years. Gravida 2 Maternal age 58-20     Review of Systems Elbert Ewings CMA; 10/15/2014 10:01 AM) General Present- Appetite Loss, Fatigue, Night Sweats and Weight Loss. Not Present- Chills, Fever and Weight Gain. Skin Not Present- Change in Wart/Mole, Dryness, Hives, Jaundice, New Lesions, Non-Healing Wounds, Rash and Ulcer. HEENT Present- Seasonal Allergies. Not Present- Earache, Hearing Loss, Hoarseness, Nose Bleed, Oral Ulcers, Ringing in the Ears, Sinus Pain, Sore Throat, Visual Disturbances, Wears glasses/contact lenses and Yellow Eyes. Respiratory Not Present- Bloody sputum, Chronic Cough, Difficulty Breathing, Snoring and Wheezing. Breast Not Present- Breast Mass, Breast Pain, Nipple Discharge and Skin Changes. Cardiovascular Not Present- Chest Pain, Difficulty Breathing Lying Down, Leg Cramps, Palpitations, Rapid Heart Rate, Shortness of Breath and Swelling of Extremities. Gastrointestinal Present- Constipation and Gets full quickly at meals. Not Present- Abdominal Pain, Bloating, Bloody Stool, Change in  Bowel Habits, Chronic diarrhea, Difficulty Swallowing, Excessive gas, Hemorrhoids, Indigestion, Nausea, Rectal Pain and Vomiting. Female Genitourinary Not Present- Frequency, Nocturia, Painful Urination, Pelvic Pain and Urgency. Musculoskeletal Present- Muscle Pain. Not Present- Back Pain, Joint Pain, Joint Stiffness, Muscle Weakness and Swelling of Extremities. Psychiatric Present- Change in Sleep Pattern and Depression. Not Present- Anxiety, Bipolar, Fearful and Frequent crying. Endocrine Not Present- Cold Intolerance, Excessive Hunger, Hair Changes, Heat Intolerance, Hot flashes and New Diabetes. Hematology Not Present- Easy Bruising, Excessive bleeding, Gland problems, HIV and Persistent Infections.  Vitals Elbert Ewings CMA; 10/15/2014 10:04 AM) 10/15/2014 10:04 AM Weight: 207 lb Height: 63in Body Surface Area: 2.04 m Body Mass Index: 36.67 kg/m Temp.: 98.14F(Oral)  Pulse: 80 (Regular)  Resp.: 17 (Unlabored)  BP: 128/82 (Sitting, Left Arm, Standard)     Physical Exam Rodman Key K. Kwabena Strutz MD; 10/15/2014 2:04 PM)  The physical exam findings are as follows: Note:WDWN in NAD HEENT: EOMI, sclera anicteric Neck: No masses, no thyromegaly Lungs: CTA bilaterally; normal respiratory effort CV: Regular rate and rhythm; no murmurs Abd: +bowel sounds, soft, healed midline incision with no hernias Mildly tender in RLQ; no palpable masses Ext: Well-perfused; no edema Skin: Warm, dry; no sign of jaundice    Assessment & Plan Rodman Key K. Daven Pinckney MD; 10/15/2014 2:05 PM)  CROHN'S DISEASE OF BOTH SMALL AND LARGE INTESTINE WITH ABSCESS (555.2  K50.814)  Current Plans I understand her reluctance to consider surgery, but this area of inflamed bowel with intra-abdominal abscesses has been present for at least 6 months and is not completely responding to medical therapy. Further treatment with immunomodulators could cause significant complications. Therefore, I would recommend exploratory  laparotomy, resection of the inflamed bowel and possible right colon resection.  Schedule for Surgery - Exploratory laparotomy/ small bowel resection/ right hemicolectomy. The surgical procedure has been discussed with the patient. Potential risks, benefits, alternative treatments, and expected outcomes have been explained. All of the patient's questions at this time have been answered. The likelihood of reaching the patient's treatment goal is good. The patient understand the proposed surgical procedure and wishes to proceed.   April May. Georgette Dover, MD, FACS [facility]  10/15/2014 2:06 PM

## 2014-11-04 ENCOUNTER — Telehealth: Payer: Self-pay

## 2014-11-04 NOTE — Telephone Encounter (Signed)
Pt is scheduled for surgery and she will come in before her surgery to get blood drawn for HgA1c

## 2014-11-17 ENCOUNTER — Other Ambulatory Visit (HOSPITAL_COMMUNITY): Payer: Self-pay | Admitting: Anesthesiology

## 2014-11-17 NOTE — Patient Instructions (Signed)
April May  11/17/2014   Your procedure is scheduled on: Wednesday 11/24/2014  Report to [facility] Main  Entrance take [place]  elevators to 3rd floor to  [place] at  [place] AM.  Call this number if you have problems the morning of surgery [phone]   Remember: ONLY 1 PERSON MAY GO WITH YOU TO SHORT STAY TO GET  READY MORNING OF [place].  Do not eat food or drink liquids :After Midnight.     Take these medicines the morning of surgery with A SIP OF WATER: Prednisone, Bupropion (Wellbutrin)                               You may not have any metal on your body including hair pins and              piercings  Do not wear jewelry, make-up, lotions, powders or perfumes, deodorant             Do not wear nail polish.  Do not shave  48 hours prior to surgery.              Men may shave face and neck.   Do not bring valuables to the hospital. [place].  Contacts, dentures or bridgework may not be worn into surgery.  Leave suitcase in the car. After surgery it may be brought to your room.     Patients discharged the day of surgery will not be allowed to drive home.  Name and phone number of your driver:  Special Instructions: N/A              Please read over the following fact sheets you were given: _____________________________________________________________________             [facility] - Preparing for Surgery Before surgery, you can play an important role.  Because skin is not sterile, your skin needs to be as free of germs as possible.  You can reduce the number of germs on your skin by washing with CHG (chlorahexidine gluconate) soap before surgery.  CHG is an antiseptic cleaner which kills germs and bonds with the skin to continue killing germs even after washing. Please DO NOT use if you have an allergy to CHG or antibacterial soaps.  If your skin becomes reddened/irritated stop using  the CHG and inform your nurse when you arrive at Short Stay. Do not shave (including legs and underarms) for at least 48 hours prior to the first CHG shower.  You may shave your face/neck. Please follow these instructions carefully:  1.  Shower with CHG Soap the night before surgery and the  morning of Surgery.  2.  If you choose to wash your hair, wash your hair first as usual with your  normal  shampoo.  3.  After you shampoo, rinse your hair and body thoroughly to remove the  shampoo.                           4.  Use CHG as you would any other liquid soap.  You can apply chg directly  to the skin and wash  Gently with a scrungie or clean washcloth.  5.  Apply the CHG Soap to your body ONLY FROM THE NECK DOWN.   Do not use on face/ open                           Wound or open sores. Avoid contact with eyes, ears mouth and genitals (private parts).                       Wash face,  Genitals (private parts) with your normal soap.             6.  Wash thoroughly, paying special attention to the area where your surgery  will be performed.  7.  Thoroughly rinse your body with warm water from the neck down.  8.  DO NOT shower/wash with your normal soap after using and rinsing off  the CHG Soap.                9.  Pat yourself dry with a clean towel.            10.  Wear clean pajamas.            11.  Place clean sheets on your bed the night of your first shower and do not  sleep with pets. Day of Surgery : Do not apply any lotions/deodorants the morning of surgery.  Please wear clean clothes to the hospital/surgery center.  FAILURE TO FOLLOW THESE INSTRUCTIONS MAY RESULT IN THE CANCELLATION OF YOUR SURGERY PATIENT SIGNATURE_________________________________  NURSE SIGNATURE__________________________________  ________________________________________________________________________   April May  An incentive spirometer is a tool that can help keep your lungs  clear and active. This tool measures how well you are filling your lungs with each breath. Taking long deep breaths may help reverse or decrease the chance of developing breathing (pulmonary) problems (especially infection) following:  A long period of time when you are unable to move or be active. BEFORE THE PROCEDURE   If the spirometer includes an indicator to show your best effort, your nurse or respiratory therapist will set it to a desired goal.  If possible, sit up straight or lean slightly forward. Try not to slouch.  Hold the incentive spirometer in an upright position. INSTRUCTIONS FOR USE  1. Sit on the edge of your bed if possible, or sit up as far as you can in bed or on a chair. 2. Hold the incentive spirometer in an upright position. 3. Breathe out normally. 4. Place the mouthpiece in your mouth and seal your lips tightly around it. 5. Breathe in slowly and as deeply as possible, raising the piston or the ball toward the top of the column. 6. Hold your breath for 3-5 seconds or for as long as possible. Allow the piston or ball to fall to the bottom of the column. 7. Remove the mouthpiece from your mouth and breathe out normally. 8. Rest for a few seconds and repeat Steps 1 through 7 at least 10 times every 1-2 hours when you are awake. Take your time and take a few normal breaths between deep breaths. 9. The spirometer may include an indicator to show your best effort. Use the indicator as a goal to work toward during each repetition. 10. After each set of 10 deep breaths, practice coughing to be sure your lungs are clear. If you have an incision (the cut made at the time of surgery),  support your incision when coughing by placing a pillow or rolled up towels firmly against it. Once you are able to get out of bed, walk around indoors and cough well. You may stop using the incentive spirometer when instructed by your caregiver.  RISKS AND COMPLICATIONS  Take your time so you do  not get dizzy or light-headed.  If you are in pain, you may need to take or ask for pain medication before doing incentive spirometry. It is harder to take a deep breath if you are having pain. AFTER USE  Rest and breathe slowly and easily.  It can be helpful to keep track of a log of your progress. Your caregiver can provide you with a simple table to help with this. If you are using the spirometer at home, follow these instructions: [place] IF:   You are having difficultly using the spirometer.  You have trouble using the spirometer as often as instructed.  Your pain medication is not giving enough relief while using the spirometer.  You develop fever of 100.5 F (38.1 C) or higher. SEEK IMMEDIATE MEDICAL CARE IF:   You cough up bloody sputum that had not been present before.  You develop fever of 102 F (38.9 C) or greater.  You develop worsening pain at or near the incision site. MAKE SURE YOU:   Understand these instructions.  Will watch your condition.  Will get help right away if you are not doing well or get worse. Document Released: 09/10/2006 Document Revised: 07/23/2011 Document Reviewed: 11/11/2006 [employer] Patient Information 2014 [place], [place].   ________________________________________________________________________

## 2014-11-19 ENCOUNTER — Encounter (HOSPITAL_COMMUNITY): Payer: Self-pay

## 2014-11-19 ENCOUNTER — Encounter (HOSPITAL_COMMUNITY)
Admission: RE | Admit: 2014-11-19 | Discharge: 2014-11-19 | Disposition: A | Discharge status: Home / Self Care | Payer: Medicare Other | Source: Ambulatory Visit | Attending: Surgery | Admitting: Surgery

## 2014-11-19 DIAGNOSIS — K5090 Crohn's disease, unspecified, without complications: Secondary | ICD-10-CM | POA: Insufficient documentation

## 2014-11-19 DIAGNOSIS — Z01812 Encounter for preprocedural laboratory examination: Secondary | ICD-10-CM | POA: Diagnosis not present

## 2014-11-19 DIAGNOSIS — Z01810 Encounter for preprocedural cardiovascular examination: Secondary | ICD-10-CM | POA: Insufficient documentation

## 2014-11-19 HISTORY — DX: Sleep apnea, unspecified: G47.30

## 2014-11-19 LAB — CBC
HCT: 41.9 % (ref 36.0–46.0)
Hemoglobin: 13.4 g/dL (ref 12.0–15.0)
MCH: 30.2 pg (ref 26.0–34.0)
MCHC: 32 g/dL (ref 30.0–36.0)
MCV: 94.6 fL (ref 78.0–100.0)
Platelets: 346 10*3/uL (ref 150–400)
RBC: 4.43 MIL/uL (ref 3.87–5.11)
RDW: 14.3 % (ref 11.5–15.5)
WBC: 13.3 10*3/uL — AB (ref 4.0–10.5)

## 2014-11-19 LAB — BASIC METABOLIC PANEL
Anion gap: 10 (ref 5–15)
BUN: 9 mg/dL (ref 6–20)
CO2: 30 mmol/L (ref 22–32)
CREATININE: 0.83 mg/dL (ref 0.44–1.00)
Calcium: 9.6 mg/dL (ref 8.9–10.3)
Chloride: 102 mmol/L (ref 101–111)
Glucose, Bld: 111 mg/dL — ABNORMAL HIGH (ref 65–99)
Potassium: 3.4 mmol/L — ABNORMAL LOW (ref 3.5–5.1)
Sodium: 142 mmol/L (ref 135–145)

## 2014-11-19 LAB — ABO/RH: ABO/RH(D): B POS

## 2014-11-24 ENCOUNTER — Inpatient Hospital Stay (HOSPITAL_COMMUNITY)
Admission: RE | Admit: 2014-11-24 | Discharge: 2014-12-05 | DRG: 329 | Disposition: A | Discharge status: Home Health | Payer: Medicare Other | Source: Ambulatory Visit | Attending: Surgery | Admitting: Surgery

## 2014-11-24 ENCOUNTER — Encounter (HOSPITAL_COMMUNITY): Payer: Self-pay | Admitting: Anesthesiology

## 2014-11-24 ENCOUNTER — Encounter (HOSPITAL_COMMUNITY)
Admission: RE | Disposition: A | Discharge status: Home Health | Payer: Self-pay | Source: Ambulatory Visit | Attending: Surgery

## 2014-11-24 ENCOUNTER — Inpatient Hospital Stay (HOSPITAL_COMMUNITY): Payer: Medicare Other | Admitting: Anesthesiology

## 2014-11-24 DIAGNOSIS — K567 Ileus, unspecified: Secondary | ICD-10-CM | POA: Diagnosis not present

## 2014-11-24 DIAGNOSIS — J9811 Atelectasis: Secondary | ICD-10-CM | POA: Diagnosis not present

## 2014-11-24 DIAGNOSIS — Z6836 Body mass index (BMI) 36.0-36.9, adult: Secondary | ICD-10-CM | POA: Diagnosis not present

## 2014-11-24 DIAGNOSIS — K660 Peritoneal adhesions (postprocedural) (postinfection): Secondary | ICD-10-CM | POA: Diagnosis present

## 2014-11-24 DIAGNOSIS — K50814 Crohn's disease of both small and large intestine with abscess: Secondary | ICD-10-CM | POA: Diagnosis not present

## 2014-11-24 DIAGNOSIS — Z820 Family history of epilepsy and other diseases of the nervous system: Secondary | ICD-10-CM

## 2014-11-24 DIAGNOSIS — F17210 Nicotine dependence, cigarettes, uncomplicated: Secondary | ICD-10-CM | POA: Diagnosis not present

## 2014-11-24 DIAGNOSIS — K50813 Crohn's disease of both small and large intestine with fistula: Secondary | ICD-10-CM | POA: Diagnosis present

## 2014-11-24 DIAGNOSIS — IMO0002 Reserved for concepts with insufficient information to code with codable children: Secondary | ICD-10-CM

## 2014-11-24 DIAGNOSIS — K651 Peritoneal abscess: Secondary | ICD-10-CM

## 2014-11-24 DIAGNOSIS — K529 Noninfective gastroenteritis and colitis, unspecified: Secondary | ICD-10-CM | POA: Diagnosis not present

## 2014-11-24 DIAGNOSIS — T814XXD Infection following a procedure, subsequent encounter: Secondary | ICD-10-CM | POA: Diagnosis not present

## 2014-11-24 DIAGNOSIS — E669 Obesity, unspecified: Secondary | ICD-10-CM | POA: Diagnosis not present

## 2014-11-24 DIAGNOSIS — Z7952 Long term (current) use of systemic steroids: Secondary | ICD-10-CM | POA: Diagnosis not present

## 2014-11-24 DIAGNOSIS — Z888 Allergy status to other drugs, medicaments and biological substances status: Secondary | ICD-10-CM | POA: Diagnosis not present

## 2014-11-24 DIAGNOSIS — J189 Pneumonia, unspecified organism: Secondary | ICD-10-CM | POA: Diagnosis not present

## 2014-11-24 DIAGNOSIS — Z823 Family history of stroke: Secondary | ICD-10-CM

## 2014-11-24 DIAGNOSIS — Z8249 Family history of ischemic heart disease and other diseases of the circulatory system: Secondary | ICD-10-CM

## 2014-11-24 DIAGNOSIS — E876 Hypokalemia: Secondary | ICD-10-CM | POA: Diagnosis not present

## 2014-11-24 DIAGNOSIS — E46 Unspecified protein-calorie malnutrition: Secondary | ICD-10-CM | POA: Diagnosis present

## 2014-11-24 DIAGNOSIS — R509 Fever, unspecified: Secondary | ICD-10-CM

## 2014-11-24 DIAGNOSIS — N739 Female pelvic inflammatory disease, unspecified: Secondary | ICD-10-CM

## 2014-11-24 DIAGNOSIS — G4733 Obstructive sleep apnea (adult) (pediatric): Secondary | ICD-10-CM | POA: Diagnosis present

## 2014-11-24 DIAGNOSIS — K5090 Crohn's disease, unspecified, without complications: Secondary | ICD-10-CM | POA: Diagnosis not present

## 2014-11-24 DIAGNOSIS — E1165 Type 2 diabetes mellitus with hyperglycemia: Secondary | ICD-10-CM

## 2014-11-24 DIAGNOSIS — E119 Type 2 diabetes mellitus without complications: Secondary | ICD-10-CM | POA: Diagnosis not present

## 2014-11-24 DIAGNOSIS — F329 Major depressive disorder, single episode, unspecified: Secondary | ICD-10-CM | POA: Diagnosis present

## 2014-11-24 DIAGNOSIS — R5082 Postprocedural fever: Secondary | ICD-10-CM | POA: Diagnosis not present

## 2014-11-24 DIAGNOSIS — Z833 Family history of diabetes mellitus: Secondary | ICD-10-CM | POA: Diagnosis not present

## 2014-11-24 DIAGNOSIS — I10 Essential (primary) hypertension: Secondary | ICD-10-CM | POA: Diagnosis present

## 2014-11-24 DIAGNOSIS — D72829 Elevated white blood cell count, unspecified: Secondary | ICD-10-CM | POA: Diagnosis not present

## 2014-11-24 DIAGNOSIS — Z79899 Other long term (current) drug therapy: Secondary | ICD-10-CM | POA: Diagnosis not present

## 2014-11-24 DIAGNOSIS — T814XXA Infection following a procedure, initial encounter: Secondary | ICD-10-CM | POA: Diagnosis not present

## 2014-11-24 DIAGNOSIS — R1031 Right lower quadrant pain: Secondary | ICD-10-CM | POA: Diagnosis present

## 2014-11-24 DIAGNOSIS — Z8719 Personal history of other diseases of the digestive system: Secondary | ICD-10-CM

## 2014-11-24 HISTORY — PX: PARTIAL COLECTOMY: SHX5273

## 2014-11-24 HISTORY — PX: LYSIS OF ADHESION: SHX5961

## 2014-11-24 HISTORY — DX: Personal history of other diseases of the digestive system: Z87.19

## 2014-11-24 HISTORY — PX: APPLICATION OF WOUND VAC: SHX5189

## 2014-11-24 HISTORY — PX: BOWEL RESECTION: SHX1257

## 2014-11-24 HISTORY — PX: LAPAROTOMY: SHX154

## 2014-11-24 LAB — CREATININE, SERUM
CREATININE: 0.86 mg/dL (ref 0.44–1.00)
GFR calc non Af Amer: >60 mL/min (ref >60)

## 2014-11-24 LAB — CBC
HEMATOCRIT: 39.3 % (ref 36.0–46.0)
HEMOGLOBIN: 12.6 g/dL (ref 12.0–15.0)
MCH: 29.9 pg (ref 26.0–34.0)
MCHC: 32.1 g/dL (ref 30.0–36.0)
MCV: 93.3 fL (ref 78.0–100.0)
Platelets: 305 10*3/uL (ref 150–400)
RBC: 4.21 MIL/uL (ref 3.87–5.11)
RDW: 14.2 % (ref 11.5–15.5)
WBC: 21.2 10*3/uL — ABNORMAL HIGH (ref 4.0–10.5)

## 2014-11-24 LAB — TYPE AND SCREEN
ABO/RH(D): B POS
Antibody Screen: NEGATIVE

## 2014-11-24 LAB — GLUCOSE, CAPILLARY
GLUCOSE-CAPILLARY: 156 mg/dL — AB (ref 65–99)
Glucose-Capillary: 94 mg/dL (ref 65–99)
Glucose-Capillary: 206 mg/dL — ABNORMAL HIGH (ref 65–99)
Glucose-Capillary: 189 mg/dL — ABNORMAL HIGH (ref 65–99)

## 2014-11-24 SURGERY — LAPAROTOMY, EXPLORATORY
Anesthesia: General | Site: Abdomen

## 2014-11-24 MED ORDER — GLYCOPYRROLATE 0.2 MG/ML IJ SOLN
INTRAMUSCULAR | Status: DC | PRN
Start: 2014-11-24 — End: 2014-11-24
  Administered 2014-11-24: .6 mg via INTRAVENOUS

## 2014-11-24 MED ORDER — CISATRACURIUM BESYLATE 20 MG/10ML IV SOLN
INTRAVENOUS | Status: AC
  Filled 2014-11-24: qty 10

## 2014-11-24 MED ORDER — AZILSARTAN-CHLORTHALIDONE 40-25 MG PO TABS: 1.0000 | ORAL_TABLET | Freq: Every day | ORAL | Status: DC

## 2014-11-24 MED ORDER — HYDROMORPHONE 0.3 MG/ML IV SOLN
INTRAVENOUS | Status: DC
  Administered 2014-11-24: 3 mg via INTRAVENOUS
  Administered 2014-11-24: 15:00:00 via INTRAVENOUS
  Administered 2014-11-25: 3 mg via INTRAVENOUS
  Administered 2014-11-25: 25 mg via INTRAVENOUS
  Administered 2014-11-25: 0.9 mg via INTRAVENOUS
  Administered 2014-11-25: 2.1 mg via INTRAVENOUS
  Administered 2014-11-25: 0.3 mg via INTRAVENOUS
  Administered 2014-11-25: 1.5 mg via INTRAVENOUS
  Administered 2014-11-25 – 2014-11-26 (×2): 0.6 mg via INTRAVENOUS
  Filled 2014-11-24: qty 25

## 2014-11-24 MED ORDER — KETAMINE HCL 10 MG/ML IJ SOLN
INTRAMUSCULAR | Status: AC
  Filled 2014-11-24: qty 1

## 2014-11-24 MED ORDER — NALOXONE HCL 0.4 MG/ML IJ SOLN: 0.4000 mg | INTRAMUSCULAR | Status: DC | PRN

## 2014-11-24 MED ORDER — SODIUM CHLORIDE 0.9 % IJ SOLN
INTRAMUSCULAR | Status: AC
  Filled 2014-11-24: qty 10

## 2014-11-24 MED ORDER — HYDROMORPHONE HCL 1 MG/ML IJ SOLN
0.2500 mg | INTRAMUSCULAR | Status: DC | PRN
  Administered 2014-11-24 (×2): 0.5 mg via INTRAVENOUS

## 2014-11-24 MED ORDER — ONDANSETRON HCL 4 MG/2ML IJ SOLN
4.0000 mg | Freq: Four times a day (QID) | INTRAMUSCULAR | Status: DC | PRN
  Administered 2014-11-26: 4 mg via INTRAVENOUS
  Filled 2014-11-24: qty 2

## 2014-11-24 MED ORDER — EPHEDRINE SULFATE 50 MG/ML IJ SOLN
INTRAMUSCULAR | Status: AC
  Filled 2014-11-24: qty 1

## 2014-11-24 MED ORDER — LAMOTRIGINE 200 MG PO TABS
200.0000 mg | ORAL_TABLET | Freq: Every day | ORAL | Status: DC
  Administered 2014-11-24: 200 mg via ORAL
  Administered 2014-11-25: 100 mg via ORAL
  Administered 2014-11-26 – 2014-12-04 (×9): 200 mg via ORAL
  Filled 2014-11-24 (×13): qty 1

## 2014-11-24 MED ORDER — ENOXAPARIN SODIUM 40 MG/0.4ML ~~LOC~~ SOLN
40.0000 mg | SUBCUTANEOUS | Status: DC
  Administered 2014-11-25 – 2014-12-05 (×10): 40 mg via SUBCUTANEOUS
  Filled 2014-11-24 (×13): qty 0.4

## 2014-11-24 MED ORDER — DIPHENHYDRAMINE HCL 50 MG/ML IJ SOLN: 12.5000 mg | Freq: Four times a day (QID) | INTRAMUSCULAR | Status: DC | PRN

## 2014-11-24 MED ORDER — SODIUM CHLORIDE 0.9 % IJ SOLN: 9.0000 mL | INTRAMUSCULAR | Status: DC | PRN

## 2014-11-24 MED ORDER — DIPHENHYDRAMINE HCL 12.5 MG/5ML PO ELIX: 12.5000 mg | ORAL_SOLUTION | Freq: Four times a day (QID) | ORAL | Status: DC | PRN

## 2014-11-24 MED ORDER — DEXAMETHASONE SODIUM PHOSPHATE 10 MG/ML IJ SOLN
INTRAMUSCULAR | Status: DC | PRN
  Administered 2014-11-24: 10 mg via INTRAVENOUS

## 2014-11-24 MED ORDER — PHENOL 1.4 % MT LIQD
1.0000 | OROMUCOSAL | Status: DC | PRN
  Filled 2014-11-24 (×2): qty 177

## 2014-11-24 MED ORDER — LACTATED RINGERS IV SOLN
INTRAVENOUS | Status: DC | PRN
  Administered 2014-11-24: 14:00:00 via INTRAVENOUS

## 2014-11-24 MED ORDER — LIP MEDEX EX OINT
TOPICAL_OINTMENT | CUTANEOUS | Status: AC
  Administered 2014-11-24: 17:00:00
  Filled 2014-11-24: qty 7

## 2014-11-24 MED ORDER — BUPIVACAINE-EPINEPHRINE (PF) 0.25% -1:200000 IJ SOLN
INTRAMUSCULAR | Status: AC
  Filled 2014-11-24: qty 30

## 2014-11-24 MED ORDER — SUCCINYLCHOLINE CHLORIDE 20 MG/ML IJ SOLN
INTRAMUSCULAR | Status: DC | PRN
  Administered 2014-11-24: 100 mg via INTRAVENOUS

## 2014-11-24 MED ORDER — HYDROMORPHONE 0.3 MG/ML IV SOLN
INTRAVENOUS | Status: AC
  Filled 2014-11-24: qty 25

## 2014-11-24 MED ORDER — PROPOFOL 10 MG/ML IV BOLUS
INTRAVENOUS | Status: DC | PRN
  Administered 2014-11-24: 130 mg via INTRAVENOUS

## 2014-11-24 MED ORDER — MIDAZOLAM HCL 2 MG/2ML IJ SOLN
INTRAMUSCULAR | Status: AC
  Filled 2014-11-24: qty 2

## 2014-11-24 MED ORDER — IRBESARTAN 300 MG PO TABS
300.0000 mg | ORAL_TABLET | Freq: Every day | ORAL | Status: DC
  Administered 2014-11-26 – 2014-12-01 (×6): 300 mg via ORAL
  Filled 2014-11-24 (×11): qty 1

## 2014-11-24 MED ORDER — MIDAZOLAM HCL 5 MG/5ML IJ SOLN
INTRAMUSCULAR | Status: DC | PRN
  Administered 2014-11-24: 2 mg via INTRAVENOUS

## 2014-11-24 MED ORDER — SUFENTANIL CITRATE 50 MCG/ML IV SOLN
INTRAVENOUS | Status: DC | PRN
  Administered 2014-11-24: 10 ug via INTRAVENOUS
  Administered 2014-11-24: 5 ug via INTRAVENOUS
  Administered 2014-11-24 (×3): 10 ug via INTRAVENOUS
  Administered 2014-11-24: 5 ug via INTRAVENOUS
  Administered 2014-11-24: 10 ug via INTRAVENOUS
  Administered 2014-11-24: 20 ug via INTRAVENOUS
  Administered 2014-11-24: 10 ug via INTRAVENOUS

## 2014-11-24 MED ORDER — ALBUMIN HUMAN 5 % IV SOLN
INTRAVENOUS | Status: AC
  Filled 2014-11-24: qty 250

## 2014-11-24 MED ORDER — BUPROPION HCL 100 MG PO TABS
100.0000 mg | ORAL_TABLET | Freq: Two times a day (BID) | ORAL | Status: DC
Start: 2014-11-24 — End: 2014-11-27
  Administered 2014-11-24 – 2014-11-26 (×2): 100 mg via ORAL
  Filled 2014-11-24 (×7): qty 1

## 2014-11-24 MED ORDER — BUPROPION HCL ER (SR) 100 MG PO TB12
100.0000 mg | ORAL_TABLET | Freq: Two times a day (BID) | ORAL | Status: DC
  Filled 2014-11-24: qty 1

## 2014-11-24 MED ORDER — SUFENTANIL CITRATE 50 MCG/ML IV SOLN
INTRAVENOUS | Status: AC
  Filled 2014-11-24: qty 1

## 2014-11-24 MED ORDER — POTASSIUM CHLORIDE IN NACL 20-0.9 MEQ/L-% IV SOLN
INTRAVENOUS | Status: AC
  Administered 2014-11-24: 17:00:00 via INTRAVENOUS
  Administered 2014-11-25: 100 mL/h via INTRAVENOUS
  Administered 2014-11-25: 15:00:00 via INTRAVENOUS
  Administered 2014-11-26: 1000 mL via INTRAVENOUS
  Filled 2014-11-24 (×5): qty 1000

## 2014-11-24 MED ORDER — PROPOFOL 10 MG/ML IV BOLUS
INTRAVENOUS | Status: AC
  Filled 2014-11-24: qty 20

## 2014-11-24 MED ORDER — CETYLPYRIDINIUM CHLORIDE 0.05 % MT LIQD
7.0000 mL | Freq: Two times a day (BID) | OROMUCOSAL | Status: DC
  Administered 2014-11-25 – 2014-12-02 (×11): 7 mL via OROMUCOSAL

## 2014-11-24 MED ORDER — METHYLENE BLUE 1 % INJ SOLN
INTRAMUSCULAR | Status: DC | PRN
  Administered 2014-11-24: 5 mL via INTRAVENOUS

## 2014-11-24 MED ORDER — ALBUMIN HUMAN 5 % IV SOLN
INTRAVENOUS | Status: DC | PRN
  Administered 2014-11-24: 14:00:00 via INTRAVENOUS

## 2014-11-24 MED ORDER — HYDROMORPHONE HCL 1 MG/ML IJ SOLN
INTRAMUSCULAR | Status: AC
  Filled 2014-11-24: qty 1

## 2014-11-24 MED ORDER — CHLORTHALIDONE 25 MG PO TABS
25.0000 mg | ORAL_TABLET | Freq: Every day | ORAL | Status: DC
  Administered 2014-11-26 – 2014-12-01 (×6): 25 mg via ORAL
  Filled 2014-11-24 (×11): qty 1

## 2014-11-24 MED ORDER — 0.9 % SODIUM CHLORIDE (POUR BTL) OPTIME
TOPICAL | Status: DC | PRN
  Administered 2014-11-24: 4000 mL

## 2014-11-24 MED ORDER — ONDANSETRON HCL 4 MG/2ML IJ SOLN
INTRAMUSCULAR | Status: DC | PRN
  Administered 2014-11-24: 4 mg via INTRAVENOUS

## 2014-11-24 MED ORDER — LIDOCAINE HCL 2 % EX GEL
CUTANEOUS | Status: AC
  Filled 2014-11-24: qty 15

## 2014-11-24 MED ORDER — DEXTROSE 5 % IV SOLN
INTRAVENOUS | Status: AC
  Filled 2014-11-24: qty 2

## 2014-11-24 MED ORDER — KETAMINE HCL 10 MG/ML IJ SOLN
INTRAMUSCULAR | Status: DC | PRN
  Administered 2014-11-24: 10 mg via INTRAVENOUS
  Administered 2014-11-24: 30 mg via INTRAVENOUS
  Administered 2014-11-24 (×5): 10 mg via INTRAVENOUS

## 2014-11-24 MED ORDER — CISATRACURIUM BESYLATE (PF) 10 MG/5ML IV SOLN
INTRAVENOUS | Status: DC | PRN
  Administered 2014-11-24: 4 mg via INTRAVENOUS
  Administered 2014-11-24: 12 mg via INTRAVENOUS
  Administered 2014-11-24 (×3): 4 mg via INTRAVENOUS

## 2014-11-24 MED ORDER — DEXTROSE 5 % IV SOLN
2.0000 g | INTRAVENOUS | Status: AC
  Administered 2014-11-24 (×3): 2 g via INTRAVENOUS

## 2014-11-24 MED ORDER — CHLORHEXIDINE GLUCONATE 0.12 % MT SOLN
15.0000 mL | Freq: Two times a day (BID) | OROMUCOSAL | Status: DC
  Administered 2014-11-24 – 2014-12-04 (×16): 15 mL via OROMUCOSAL
  Filled 2014-11-24 (×24): qty 15

## 2014-11-24 MED ORDER — NEOSTIGMINE METHYLSULFATE 10 MG/10ML IV SOLN
INTRAVENOUS | Status: DC | PRN
  Administered 2014-11-24: 4 mg via INTRAVENOUS

## 2014-11-24 MED ORDER — METHYLENE BLUE 1 % INJ SOLN
INTRAMUSCULAR | Status: AC
  Filled 2014-11-24: qty 10

## 2014-11-24 MED ORDER — ONDANSETRON HCL 4 MG/2ML IJ SOLN
INTRAMUSCULAR | Status: AC
Start: 2014-11-24 — End: 2014-11-24
  Filled 2014-11-24: qty 2

## 2014-11-24 MED ORDER — CEFOXITIN SODIUM 1 G IV SOLR
1.0000 g | Freq: Four times a day (QID) | INTRAVENOUS | Status: AC
  Administered 2014-11-24 – 2014-11-25 (×3): 1 g via INTRAVENOUS
  Filled 2014-11-24 (×3): qty 1

## 2014-11-24 MED ORDER — EPHEDRINE SULFATE 50 MG/ML IJ SOLN
INTRAMUSCULAR | Status: DC | PRN
  Administered 2014-11-24: 10 mg via INTRAVENOUS
  Administered 2014-11-24: 5 mg via INTRAVENOUS
  Administered 2014-11-24: 10 mg via INTRAVENOUS
  Administered 2014-11-24: 5 mg via INTRAVENOUS

## 2014-11-24 MED ORDER — LIDOCAINE HCL (CARDIAC) 20 MG/ML IV SOLN
INTRAVENOUS | Status: DC | PRN
  Administered 2014-11-24: 100 mg via INTRAVENOUS

## 2014-11-24 MED ORDER — DEXAMETHASONE SODIUM PHOSPHATE 10 MG/ML IJ SOLN
INTRAMUSCULAR | Status: AC
  Filled 2014-11-24: qty 1

## 2014-11-24 MED ORDER — FLUTICASONE PROPIONATE 50 MCG/ACT NA SUSP
2.0000 | Freq: Every day | NASAL | Status: DC | PRN
Start: 2014-11-24 — End: 2014-12-05

## 2014-11-24 MED ORDER — LACTATED RINGERS IV SOLN
INTRAVENOUS | Status: DC | PRN
  Administered 2014-11-24 (×5): via INTRAVENOUS

## 2014-11-24 MED ORDER — DEXTROSE 5 % IV SOLN
INTRAVENOUS | Status: AC
  Filled 2014-11-24 (×2): qty 2

## 2014-11-24 MED ORDER — LIDOCAINE HCL (CARDIAC) 20 MG/ML IV SOLN
INTRAVENOUS | Status: AC
  Filled 2014-11-24: qty 5

## 2014-11-24 SURGICAL SUPPLY — 65 items
APPLIER CLIP 5 13 M/L LIGAMAX5 (MISCELLANEOUS)
APPLIER CLIP ROT 10 11.4 M/L (STAPLE)
APR CLP MED LRG 11.4X10 (STAPLE)
APR CLP MED LRG 5 ANG JAW (MISCELLANEOUS)
BLADE EXTENDED COATED 6.5IN (ELECTRODE) ×4 IMPLANT
BLADE HEX COATED 2.75 (ELECTRODE) ×4 IMPLANT
CELLS DAT CNTRL 66122 CELL SVR (MISCELLANEOUS) IMPLANT
CHLORAPREP W/TINT 26ML (MISCELLANEOUS) ×4 IMPLANT
CLIP APPLIE 5 13 M/L LIGAMAX5 (MISCELLANEOUS) IMPLANT
CLIP APPLIE ROT 10 11.4 M/L (STAPLE) IMPLANT
COVER SURGICAL LIGHT HANDLE (MISCELLANEOUS) ×4 IMPLANT
DECANTER SPIKE VIAL GLASS SM (MISCELLANEOUS) IMPLANT
DRAPE CAMERA CLOSED 9X96 (DRAPES) IMPLANT
DRAPE LAPAROSCOPIC ABDOMINAL (DRAPES) ×4 IMPLANT
DRAPE UTILITY XL STRL (DRAPES) ×4 IMPLANT
DRSG VAC ATS MED SENSATRAC (GAUZE/BANDAGES/DRESSINGS) ×4 IMPLANT
ELECT REM PT RETURN 9FT ADLT (ELECTROSURGICAL) ×4
ELECTRODE REM PT RTRN 9FT ADLT (ELECTROSURGICAL) ×3 IMPLANT
ENSEAL DEVICE STD TIP 35CM (ENDOMECHANICALS) IMPLANT
GAUZE SPONGE 4X4 12PLY STRL (GAUZE/BANDAGES/DRESSINGS) IMPLANT
GLOVE BIO SURGEON STRL SZ7 (GLOVE) ×8 IMPLANT
GLOVE BIOGEL PI IND STRL 7.5 (GLOVE) ×3 IMPLANT
GLOVE BIOGEL PI INDICATOR 7.5 (GLOVE) ×1
GOWN SPEC L4 XLG W/TWL (GOWN DISPOSABLE) ×4 IMPLANT
GOWN STRL REUS W/TWL LRG LVL3 (GOWN DISPOSABLE) ×8 IMPLANT
GOWN STRL REUS W/TWL XL LVL3 (GOWN DISPOSABLE) ×8 IMPLANT
LIGASURE IMPACT 36 18CM CVD LR (INSTRUMENTS) ×4 IMPLANT
NS IRRIG 1000ML POUR BTL (IV SOLUTION) IMPLANT
PACK COLON (CUSTOM PROCEDURE TRAY) ×4 IMPLANT
PACK GENERAL/GYN (CUSTOM PROCEDURE TRAY) IMPLANT
PAD POSITIONING PINK XL (MISCELLANEOUS) ×4 IMPLANT
RELOAD PROXIMATE 75MM BLUE (ENDOMECHANICALS) ×8 IMPLANT
RTRCTR WOUND ALEXIS 18CM MED (MISCELLANEOUS)
SET IRRIG TUBING LAPAROSCOPIC (IRRIGATION / IRRIGATOR) IMPLANT
SHEARS HARMONIC ACE PLUS 36CM (ENDOMECHANICALS) ×4 IMPLANT
SLEEVE XCEL OPT CAN 5 100 (ENDOMECHANICALS) ×4 IMPLANT
SOLUTION ANTI FOG 6CC (MISCELLANEOUS) IMPLANT
SPONGE LAP 18X18 X RAY DECT (DISPOSABLE) ×16 IMPLANT
STAPLER GUN LINEAR PROX 60 (STAPLE) ×4 IMPLANT
STAPLER PROXIMATE 75MM BLUE (STAPLE) ×4 IMPLANT
STAPLER VISISTAT 35W (STAPLE) ×4 IMPLANT
STRIP CLOSURE SKIN 1/2X4 (GAUZE/BANDAGES/DRESSINGS) IMPLANT
SUCTION POOLE TIP (SUCTIONS) IMPLANT
SUT PDS AB 1 CT1 27 (SUTURE) IMPLANT
SUT PDS AB 1 CTX 36 (SUTURE) IMPLANT
SUT PDS AB 1 TP1 96 (SUTURE) ×8 IMPLANT
SUT PDS AB 4-0 SH 27 (SUTURE) IMPLANT
SUT SILK 2 0 (SUTURE) ×3
SUT SILK 2 0 SH CR/8 (SUTURE) ×4 IMPLANT
SUT SILK 2 0SH CR/8 30 (SUTURE) ×4 IMPLANT
SUT SILK 2-0 18XBRD TIE 12 (SUTURE) ×3 IMPLANT
SUT SILK 3 0 (SUTURE) ×1
SUT SILK 3 0 SH CR/8 (SUTURE) ×4 IMPLANT
SUT SILK 3-0 18XBRD TIE 12 (SUTURE) ×3 IMPLANT
SUT VIC AB 2-0 CT1 27 (SUTURE)
SUT VIC AB 2-0 CT1 27XBRD (SUTURE) IMPLANT
SUT VICRYL 0 UR6 27IN ABS (SUTURE) IMPLANT
TOWEL OR 17X26 10 PK STRL BLUE (TOWEL DISPOSABLE) IMPLANT
TOWEL OR NON WOVEN STRL DISP B (DISPOSABLE) ×8 IMPLANT
TRAY FOLEY W/METER SILVER 14FR (SET/KITS/TRAYS/PACK) ×4 IMPLANT
TROCAR BLADELESS OPT 5 100 (ENDOMECHANICALS) IMPLANT
TROCAR XCEL NON-BLD 11X100MML (ENDOMECHANICALS) IMPLANT
TROCAR XCEL UNIV SLVE 11M 100M (ENDOMECHANICALS) IMPLANT
TUBING FILTER THERMOFLATOR (ELECTROSURGICAL) IMPLANT
YANKAUER SUCT BULB TIP NO VENT (SUCTIONS) IMPLANT

## 2014-11-24 NOTE — Anesthesia Preprocedure Evaluation (Addendum)
Anesthesia Evaluation  Patient identified by MRN, date of birth, ID band Patient awake    Reviewed: Allergy & Precautions, NPO status , Patient's Chart, lab work & pertinent test results  Airway Mallampati: II  TM Distance: >3 FB Neck ROM: Full    Dental  (+) Edentulous Upper, Dental Advisory Given,    Pulmonary sleep apnea , pneumonia -, resolved, COPDCurrent Smoker,  breath sounds clear to auscultation  Pulmonary exam normal       Cardiovascular Exercise Tolerance: Good hypertension, Pt. on medications Normal cardiovascular examRhythm:Regular Rate:Normal  ECG: Nonspecific TWA   Neuro/Psych PSYCHIATRIC DISORDERS Depression negative neurological ROS     GI/Hepatic negative GI ROS, Neg liver ROS,   Endo/Other  diabetes, Type 2  Renal/GU negative Renal ROS  negative genitourinary   Musculoskeletal negative musculoskeletal ROS (+)   Abdominal (+) + obese,   Peds negative pediatric ROS (+)  Hematology negative hematology ROS (+)   Anesthesia Other Findings Chronic steroids  Reproductive/Obstetrics negative OB ROS                          Anesthesia Physical Anesthesia Plan  ASA: III  Anesthesia Plan: General   Post-op Pain Management:    Induction: Intravenous  Airway Management Planned: Oral ETT  Additional Equipment:   Intra-op Plan:   Post-operative Plan: Extubation in OR and Possible Post-op intubation/ventilation  Informed Consent: I have reviewed the patients History and Physical, chart, labs and discussed the procedure including the risks, benefits and alternatives for the proposed anesthesia with the patient or authorized representative who has indicated his/her understanding and acceptance.   Dental advisory given  Plan Discussed with: CRNA  Anesthesia Plan Comments:        Anesthesia Quick Evaluation

## 2014-11-24 NOTE — Anesthesia Procedure Notes (Signed)
Procedure Name: Intubation Date/Time: 11/24/2014 10:21 AM Performed by: Danley Danker L Patient Re-evaluated:Patient Re-evaluated prior to inductionOxygen Delivery Method: Circle system utilized Preoxygenation: Pre-oxygenation with 100% oxygen Intubation Type: IV induction Ventilation: Mask ventilation without difficulty and Oral airway inserted - appropriate to patient size Laryngoscope Size: Sabra Heck and 2 Grade View: Grade II Tube type: Oral Tube size: 7.5 mm Number of attempts: 1 Airway Equipment and Method: Stylet Placement Confirmation: ETT inserted through vocal cords under direct vision,  positive ETCO2 and breath sounds checked- equal and bilateral Secured at: 21 cm Tube secured with: Tape Dental Injury: Teeth and Oropharynx as per pre-operative assessment

## 2014-11-24 NOTE — Anesthesia Postprocedure Evaluation (Signed)
  Anesthesia Post-op Note  Patient: April May  Procedure(s) Performed: Procedure(s) (LRB): EXPLORATORY LAPAROTOMY EXTENSIVE LYSIS OF ADHESIONS SAMLL BOWEL RESECTION RIGHT HEMI COLECTOMY WOUND VAC APPLICATION.  (N/A) SMALL BOWEL RESECTION (N/A) LYSIS OF ADHESION (3 HRS) RIGHT HEMI COLECTOMY APPLICATION OF WOUND VAC  Patient Location: PACU  Anesthesia Type: General  Level of Consciousness: awake and alert   Airway and Oxygen Therapy: Patient Spontanous Breathing  Post-op Pain: mild  Post-op Assessment: Post-op Vital signs reviewed, Patient's Cardiovascular Status Stable, Respiratory Function Stable, Patent Airway and No signs of Nausea or vomiting  Last Vitals:  Filed Vitals:   11/24/14 1545  BP: 108/63  Pulse: 101  Temp:   Resp: 11    Post-op Vital Signs: stable   Complications: No apparent anesthesia complications

## 2014-11-24 NOTE — Transfer of Care (Signed)
Immediate Anesthesia Transfer of Care Note  Patient: April May  Procedure(s) Performed: Procedure(s): EXPLORATORY LAPAROTOMY EXTENSIVE LYSIS OF ADHESIONS SAMLL BOWEL RESECTION RIGHT HEMI COLECTOMY WOUND VAC APPLICATION.  (N/A) SMALL BOWEL RESECTION (N/A) LYSIS OF ADHESION (3 HRS) RIGHT HEMI COLECTOMY APPLICATION OF WOUND VAC  Patient Location: PACU  Anesthesia Type:General  Level of Consciousness: sedated  Airway & Oxygen Therapy: Patient Spontanous Breathing and Patient connected to face mask oxygen  Post-op Assessment: Report given to RN and Post -op Vital signs reviewed and stable  Post vital signs: Reviewed and stable  Last Vitals:  Filed Vitals:   11/24/14 0802  BP: 96/64  Pulse: 78  Temp: 36.9 C  Resp: 16    Complications: No apparent anesthesia complications

## 2014-11-24 NOTE — Op Note (Signed)
Pre-op Diagnosis:  Crohns disease; small bowel/ right colon phlegmon with intra-abdominal abscess and possible fistulae Post-op Diagnosis:  Same Procedure:  1.  Exploratory laparotomy   2.  Extensive lysis of adhesions (3 hours)   3.  Small bowel resection   4.  Right hemicolectomy   5.  Placement of abdominal wound VAC (30 cm2) Surgeon:  Maia Petties. Assistant:  Dr. Ralene Ok, Dr. Kaylyn Lim Anesthesia:  GETT Indications:  This is a 59 year old female with a very long history of Crohn's disease who is status post bowel resection about 30 years ago. Those operative notes are not available. Over the last several months she has had right lower quadrant pain and was found to have a mass of inflamed small bowel with phlegmon and possible intraloop abscesses/fistulae. She was treated aggressively with medical therapy by her gastroenterologist but the symptoms and the CT findings persists. She presents now for exploration and resection of the affected small bowel.  Description of procedure: The patient brought to the operating room and placed in a supine position on the operating room table. After an adequate level of general anesthesia was obtained, a Foley catheter was placed under sterile technique. The patient's abdomen was prepped with ChloraPrep and draped sterile fashion. A timeout was taken to ensure the proper patient proper procedure. We made a midline incision beginning just above her umbilicus extending down towards the pelvis. Dissection was carried down through a considerable amount of scar tissue to the fascia. The fascia was opened up near the umbilicus in the linea alba. Were able to bluntly dissect into the peritoneal cavity. I enlarged the opening enough to insert an index finger. There is a very firm mass effect in the right lower quadrant that comes up to the midline. Cautiously we began opening the wound wider. The fascia seems to be clear just to the left of midline. Using a  combination of blunt and sharp dissection were able to begin dissecting some of this hard scar tissue away from the abdominal wall. This seems to involve several loops of small bowel. We were finally able to get fascia opened the length of our entire incision. We then spent the next 2-1/2-3 hours taking down very very dense of adhesions between several loops of small bowel. We identified the transverse colon and mobilized the omentum away from the transverse colon. Proximally the patient has a considerable amount of normal-appearing small bowel. We used a combination of blunt and sharp dissection as well as a finger fracture technique to mobilize the small bowel out of the pelvis in the right lower quadrant. Several small enterotomies were made in this inflamed small bowel. There are several pockets of purulence that were encountered. We were able to finally dissect the bowel out of the right lower quadrant. There is a considerable amount of hard scar tissue remaining in the right lower quadrant. We cannot identify any anatomic landmarks. We administered methylene blue and there was no sign of any leaking of blue urine into the pelvis. We mobilized the colon proximally. We were finally able to get the previous ileocolic anastomosis mobilize. He divided the small bowel just proximal to the inflamed scarred area with a GIA-75 stapler. We divided the mid transverse colon for GIA-75 stapler. The mesentery was then ligated with the LigaSure device. Several large vessels were ligated with 2-0 silk sutures. These entire specimen was sent for pathologic examination. We irrigated the abdomen thoroughly and inspected for hemostasis. Again no methylene blue was noted  within the pelvis. Our sponge count was correct. We then created a stapled side to side anastomosis between the ileum and the transverse colon using a GIA-75 stapler. The common defect was closed with a TA 60 stapler. The mesenteric defect was closed with 2-0 silk  sutures. The crotch of the anastomosis was reinforced with 3-0 silk. The omentum was placed over the anastomosis. We irrigated the abdomen thoroughly. The nasogastric tube was palpated within the stomach. The fascia was reapproximated with double-stranded #1 PDS suture. Because of the contamination and the presence of purulence in the pelvis we made the decision not to close the skin. A medium VAC sponge was cut to fit the incision and was placed into the wound. This was sealed with an occlusive drape. This was placed to 125 mmHg suction. There was a good seal with no sign of leak. The patient was extubated and brought to the recovery room in  stable condition. All sponge, initially, and needle counts are correct.    Imogene Burn. Georgette Dover, MD, [facility] Surgery  General/ Trauma Surgery  11/24/2014 3:22 PM

## 2014-11-24 NOTE — Addendum Note (Signed)
Addendum  created 11/24/14 1719 by Franne Grip, MD   Modules edited: Clinical Notes, Orders, PRL Based Order Sets   Clinical Notes:  File: [id number]

## 2014-11-24 NOTE — H&P (Signed)
History of Present Illness  Patient words: crohns disease.  The patient is a 59 year old female who presents with crohn's disease. Referred by Dr. Carol Ada for evaluation for possible bowel resection.  This is a 59 yo female with a long history of Crohns' disease s/p bowel resection about 30 years ago by Dr. Lennie Hummer. Since that time, she has had minimal problems with flare-ups of her Crohns', but recently she has developed some intermittent RLQ abdominal pain. In January of 2016, she presented with RLQ abdominal pain and was found to have a mass of inflamed small bowel with possible phlegmon/ intraloop abscesses. She was treated aggressively by Dr. Benson Norway, but a follow-up CT scan in May shows improvement, but a persistence of the inflamed small bowel in the RLQ. She is unable to tolerate Azathioprine because of previous pancreatitis. He would like to consider using Humira, but is hesitant to treat her with this with active inflammation and possible intra-abdominal chronic abscess. She is currently minimally symptomatic.    CLINICAL DATA: Crohn's disease. Followup abscess.  EXAM: CT ABDOMEN AND PELVIS WITH CONTRAST  TECHNIQUE: Multidetector CT imaging of the abdomen and pelvis was performed using the standard protocol following bolus administration of intravenous contrast.  CONTRAST: 151m OMNIPAQUE IOHEXOL 300 MG/ML SOLN  COMPARISON: 06/10/2014  FINDINGS: Lower chest: The lung bases are grossly clear. No pleural effusion. Small hiatal hernia.  Hepatobiliary: No focal hepatic lesions or intrahepatic biliary dilatation. Stable small low-attenuation lesions near the dome are likely cysts. The gallbladder is normal. No common bile duct dilatation.  Pancreas: Normal  Spleen: Normal  Adrenals/Urinary Tract: Stable mild nodularity of the adrenal glands. The kidneys are normal and stable.  Stomach/Bowel: The stomach, duodenum and proximal small bowel loops are normal.  The distal loops of ileum are clumped with a phlegmonous mass in the right lower quadrant/upper right pelvis. Small central fluid collections with rim enhancement are noted consistent with small abscesses. These are significantly smaller when compared to the prior CT scan. The largest measures 18.5 mm on image number 56. No small bowel obstruction. The terminal ileum demonstrates diffuse wall thickening. The colon is unremarkable.  Vascular/Lymphatic: Persistent numerous borderline enlarged mesenteric and retroperitoneal lymph nodes. No interval change. Stable advanced atherosclerotic calcifications involving the aorta. The major venous structures are patent.  Other: The uterus is surgically absent. The bladder is grossly normal. No pelvic mass or free pelvic fluid collections. No inguinal mass or adenopathy.  Musculoskeletal: Stable SI joint and pubic symphysis degenerative changes. No significant bony findings.  IMPRESSION: 1. Persistent phlegmonous mass in the right lower quadrant and upper pelvis surrounding matted and clumped distal small bowel loops. Small persistent fluid collections but overall the abscesses are much smaller. 2. Moderate wall thickening of the terminal ileum. No obstructive findings. 3. Stable borderline enlarged mesenteric and retroperitoneal lymph nodes.   Electronically Signed By: PMarijo SanesM.D. On: 09/15/2014 13:09 Other Problems Crohn's Disease Depression Diabetes Mellitus High blood pressure  Past Surgical History  Cesarean Section - 1 Resection of Small Bowel  Diagnostic Studies History  Colonoscopy 1-5 years ago Mammogram 1-3 years ago  Allergies  Lisinopril *CHEMICALS* Crestor *ANTIHYPERLIPIDEMICS* Lipitor *ANTIHYPERLIPIDEMICS* AzaTHIOprine *ASSORTED CLASSES*  Medication History  Amitiza (24MCG Capsule, Oral) Active. Wellbutrin SR (100MG Tablet ER 12HR, Oral) Active. LaMICtal ODT (200MG Tablet Disperse,  Oral) Active. Flonase (50MCG/ACT Suspension, Nasal) Active. Ambien (10MG Tablet, Oral) Active. Medications Reconciled  Social History  Caffeine use Coffee. Tobacco use Current every day smoker.  Family History  Arthritis Father. Cerebrovascular Accident Mother. Depression Mother, Sister. Diabetes Mellitus Father, Sister. Heart Disease Mother. Hypertension Daughter, Father, Mother. Ischemic Bowel Disease Mother. Kidney Disease Mother. Seizure disorder Mother.  Pregnancy / Birth History  Age at menarche 67 years. Gravida 2 Maternal age 49-20     Review of Systems  General Present- Appetite Loss, Fatigue, Night Sweats and Weight Loss. Not Present- Chills, Fever and Weight Gain. Skin Not Present- Change in Wart/Mole, Dryness, Hives, Jaundice, New Lesions, Non-Healing Wounds, Rash and Ulcer. HEENT Present- Seasonal Allergies. Not Present- Earache, Hearing Loss, Hoarseness, Nose Bleed, Oral Ulcers, Ringing in the Ears, Sinus Pain, Sore Throat, Visual Disturbances, Wears glasses/contact lenses and Yellow Eyes. Respiratory Not Present- Bloody sputum, Chronic Cough, Difficulty Breathing, Snoring and Wheezing. Breast Not Present- Breast Mass, Breast Pain, Nipple Discharge and Skin Changes. Cardiovascular Not Present- Chest Pain, Difficulty Breathing Lying Down, Leg Cramps, Palpitations, Rapid Heart Rate, Shortness of Breath and Swelling of Extremities. Gastrointestinal Present- Constipation and Gets full quickly at meals. Not Present- Abdominal Pain, Bloating, Bloody Stool, Change in Bowel Habits, Chronic diarrhea, Difficulty Swallowing, Excessive gas, Hemorrhoids, Indigestion, Nausea, Rectal Pain and Vomiting. Female Genitourinary Not Present- Frequency, Nocturia, Painful Urination, Pelvic Pain and Urgency. Musculoskeletal Present- Muscle Pain. Not Present- Back Pain, Joint Pain, Joint Stiffness, Muscle Weakness and Swelling of Extremities. Psychiatric Present- Change in  Sleep Pattern and Depression. Not Present- Anxiety, Bipolar, Fearful and Frequent crying. Endocrine Not Present- Cold Intolerance, Excessive Hunger, Hair Changes, Heat Intolerance, Hot flashes and New Diabetes. Hematology Not Present- Easy Bruising, Excessive bleeding, Gland problems, HIV and Persistent Infections.  Vitals  Weight: 207 lb Height: 63in Body Surface Area: 2.04 m Body Mass Index: 36.67 kg/m Temp.: 98.64F(Oral)  Pulse: 80 (Regular)  Resp.: 17 (Unlabored)  BP: 128/82 (Sitting, Left Arm, Standard)     Physical Exam   The physical exam findings are as follows: Note:WDWN in NAD HEENT: EOMI, sclera anicteric Neck: No masses, no thyromegaly Lungs: CTA bilaterally; normal respiratory effort CV: Regular rate and rhythm; no murmurs Abd: +bowel sounds, soft, healed midline incision with no hernias Mildly tender in RLQ; no palpable masses Ext: Well-perfused; no edema Skin: Warm, dry; no sign of jaundice    Assessment & Plan   CROHN'S DISEASE OF BOTH SMALL AND LARGE INTESTINE WITH ABSCESS (555.2  K50.814)  Current Plans I understand her reluctance to consider surgery, but this area of inflamed bowel with intra-abdominal abscesses has been present for at least 6 months and is not completely responding to medical therapy. Further treatment with immunomodulators could cause significant complications. Therefore, I would recommend exploratory laparotomy, resection of the inflamed bowel and possible right colon resection.  Schedule for Surgery - Exploratory laparotomy/ small bowel resection/ right hemicolectomy. The surgical procedure has been discussed with the patient. Potential risks, benefits, alternative treatments, and expected outcomes have been explained. All of the patient's questions at this time have been answered. The likelihood of reaching the patient's treatment goal is good. The patient understand the proposed surgical procedure and wishes to  proceed.  Imogene Burn. Georgette Dover, MD, [facility] Surgery  General/ Trauma Surgery  11/24/2014 9:30 AM

## 2014-11-25 ENCOUNTER — Encounter (HOSPITAL_COMMUNITY): Payer: Self-pay | Admitting: Surgery

## 2014-11-25 LAB — BASIC METABOLIC PANEL
ANION GAP: 8 (ref 5–15)
BUN: 13 mg/dL (ref 6–20)
CHLORIDE: 102 mmol/L (ref 101–111)
CO2: 26 mmol/L (ref 22–32)
Calcium: 8.6 mg/dL — ABNORMAL LOW (ref 8.9–10.3)
Creatinine, Ser: 0.79 mg/dL (ref 0.44–1.00)
GFR calc non Af Amer: >60 mL/min (ref >60)
Glucose, Bld: 151 mg/dL — ABNORMAL HIGH (ref 65–99)
POTASSIUM: 3.8 mmol/L (ref 3.5–5.1)
SODIUM: 136 mmol/L (ref 135–145)

## 2014-11-25 LAB — CBC
HCT: 36.7 % (ref 36.0–46.0)
Hemoglobin: 11.7 g/dL — ABNORMAL LOW (ref 12.0–15.0)
MCH: 29.5 pg (ref 26.0–34.0)
MCHC: 31.9 g/dL (ref 30.0–36.0)
MCV: 92.4 fL (ref 78.0–100.0)
Platelets: 259 10*3/uL (ref 150–400)
RBC: 3.97 MIL/uL (ref 3.87–5.11)
RDW: 14.1 % (ref 11.5–15.5)
WBC: 27.7 10*3/uL — ABNORMAL HIGH (ref 4.0–10.5)

## 2014-11-25 LAB — POCT I-STAT 4, (NA,K, GLUC, HGB,HCT)
GLUCOSE: 171 mg/dL — AB (ref 65–99)
Glucose, Bld: 158 mg/dL — ABNORMAL HIGH (ref 65–99)
HEMATOCRIT: 36 % (ref 36.0–46.0)
HEMATOCRIT: 41 % (ref 36.0–46.0)
HEMOGLOBIN: 12.2 g/dL (ref 12.0–15.0)
HEMOGLOBIN: 13.9 g/dL (ref 12.0–15.0)
POTASSIUM: 3.1 mmol/L — AB (ref 3.5–5.1)
POTASSIUM: 5.8 mmol/L — AB (ref 3.5–5.1)
SODIUM: 133 mmol/L — AB (ref 135–145)
Sodium: 133 mmol/L — ABNORMAL LOW (ref 135–145)

## 2014-11-25 MED ORDER — ACETAMINOPHEN 10 MG/ML IV SOLN
1000.0000 mg | Freq: Once | INTRAVENOUS | Status: AC
  Administered 2014-11-25: 1000 mg via INTRAVENOUS
  Filled 2014-11-25: qty 100

## 2014-11-25 MED ORDER — ACETAMINOPHEN 325 MG PO TABS
650.0000 mg | ORAL_TABLET | Freq: Once | ORAL | Status: DC
  Filled 2014-11-25: qty 2

## 2014-11-25 NOTE — Plan of Care (Signed)
Problem: Phase I Progression Outcomes Goal: Incision/dressings dry and intact Outcome: Completed/Met Date Met:  11/25/14 Wound vac

## 2014-11-25 NOTE — Progress Notes (Signed)
1 Day Post-Op  Subjective: Patient awake, pain controlled with PCA No flatus NG functioning  Objective: Vital signs in last 24 hours: Temp:  [96.4 F (35.8 C)-98.5 F (36.9 C)] 98.4 F (36.9 C) (07/14 0554) Pulse Rate:  [78-104] 103 (07/14 0554) Resp:  [6-19] 17 (07/14 0554) BP: (93-118)/(48-70) 115/63 mmHg (07/14 0554) SpO2:  [94 %-100 %] 100 % (07/14 0554) Weight:  [93.243 kg (205 lb 9 oz)] 93.243 kg (205 lb 9 oz) (07/13 0904) Last BM Date: 11/24/14  Intake/Output from previous day: 07/13 0701 - 07/14 0700 In: 6200 [I.V.:5950; IV Piggyback:250] Out: 1990 [HWEXH:3716; Emesis/NG output:150; Blood:800] Intake/Output this shift: Total I/O In: 800 [I.V.:800] Out: 800 [Urine:650; Emesis/NG output:150]  General appearance: alert, cooperative and no distress Resp: clear to auscultation bilaterally Cardio: regular rate and rhythm, S1, S2 normal, no murmur, click, rub or gallop GI: distended; absent bowel sounds VAC to good seal; minimal drainage  Urine still blue-tinged from methylene blue intra-op Lab Results:  Current labs pending  Recent Labs  11/24/14 1622  WBC 21.2*  HGB 12.6  HCT 39.3  PLT 305   BMET  Recent Labs  11/24/14 1622  CREATININE 0.86   PT/INR No results for input(s): LABPROT, INR in the last 72 hours. ABG No results for input(s): PHART, HCO3 in the last 72 hours.  Invalid input(s): PCO2, PO2  Studies/Results: No results found.  Anti-infectives: Anti-infectives    Start     Dose/Rate Route Frequency Ordered Stop   11/24/14 2200  cefOXitin (MEFOXIN) 1 g in dextrose 5 % 50 mL IVPB     1 g 100 mL/hr over 30 Minutes Intravenous Every 6 hours 11/24/14 1601 11/25/14 1559   11/24/14 0822  cefOXitin (MEFOXIN) 2 g in dextrose 5 % 50 mL IVPB     2 g 100 mL/hr over 30 Minutes Intravenous On call to O.R. 11/24/14 0822 11/24/14 1425      Assessment/Plan: s/p Procedure(s): EXPLORATORY LAPAROTOMY EXTENSIVE LYSIS OF ADHESIONS SAMLL BOWEL RESECTION  RIGHT HEMI COLECTOMY WOUND VAC APPLICATION.  (N/A) SMALL BOWEL RESECTION (N/A) LYSIS OF ADHESION (3 HRS) RIGHT HEMI COLECTOMY APPLICATION OF WOUND VAC d/c foley Ambulate with assistance  Abdominal binder Patient will likely have post-operative ileus for several days.  If ileus persists after 3-4 days, will consider PICC line/ TNA. VAC change MWF  LOS: 1 day    April May K. 11/25/2014

## 2014-11-26 ENCOUNTER — Inpatient Hospital Stay (HOSPITAL_COMMUNITY): Payer: Medicare Other

## 2014-11-26 ENCOUNTER — Telehealth: Payer: Self-pay | Admitting: Internal Medicine

## 2014-11-26 LAB — HEPATIC FUNCTION PANEL
ALK PHOS: 45 U/L (ref 38–126)
ALT: 12 U/L — ABNORMAL LOW (ref 14–54)
AST: 17 U/L (ref 15–41)
Albumin: 2.9 g/dL — ABNORMAL LOW (ref 3.5–5.0)
BILIRUBIN DIRECT: 0.1 mg/dL (ref 0.1–0.5)
BILIRUBIN INDIRECT: 0.2 mg/dL — AB (ref 0.3–0.9)
Total Bilirubin: 0.3 mg/dL (ref 0.3–1.2)
Total Protein: 6.2 g/dL — ABNORMAL LOW (ref 6.5–8.1)

## 2014-11-26 LAB — URINALYSIS, ROUTINE W REFLEX MICROSCOPIC
Bilirubin Urine: NEGATIVE
Glucose, UA: NEGATIVE mg/dL
Ketones, ur: NEGATIVE mg/dL
Leukocytes, UA: NEGATIVE
NITRITE: NEGATIVE
Protein, ur: NEGATIVE mg/dL
Specific Gravity, Urine: 1.015 (ref 1.005–1.030)
UROBILINOGEN UA: 0.2 mg/dL (ref 0.0–1.0)
pH: 5.5 (ref 5.0–8.0)

## 2014-11-26 LAB — BASIC METABOLIC PANEL
Anion gap: 8 (ref 5–15)
BUN: 9 mg/dL (ref 6–20)
CALCIUM: 8.6 mg/dL — AB (ref 8.9–10.3)
CO2: 24 mmol/L (ref 22–32)
Chloride: 103 mmol/L (ref 101–111)
Creatinine, Ser: 0.58 mg/dL (ref 0.44–1.00)
GFR calc Af Amer: >60 mL/min (ref >60)
GLUCOSE: 118 mg/dL — AB (ref 65–99)
Potassium: 3.4 mmol/L — ABNORMAL LOW (ref 3.5–5.1)
Sodium: 135 mmol/L (ref 135–145)

## 2014-11-26 LAB — URINE MICROSCOPIC-ADD ON

## 2014-11-26 LAB — MAGNESIUM: MAGNESIUM: 1.5 mg/dL — AB (ref 1.7–2.4)

## 2014-11-26 LAB — CBC
HEMATOCRIT: 33.1 % — AB (ref 36.0–46.0)
Hemoglobin: 10.7 g/dL — ABNORMAL LOW (ref 12.0–15.0)
MCH: 29.6 pg (ref 26.0–34.0)
MCHC: 32.3 g/dL (ref 30.0–36.0)
MCV: 91.7 fL (ref 78.0–100.0)
Platelets: 208 10*3/uL (ref 150–400)
RBC: 3.61 MIL/uL — AB (ref 3.87–5.11)
RDW: 14.1 % (ref 11.5–15.5)
WBC: 17.9 10*3/uL — AB (ref 4.0–10.5)

## 2014-11-26 LAB — PHOSPHORUS: PHOSPHORUS: 2.2 mg/dL — AB (ref 2.5–4.6)

## 2014-11-26 MED ORDER — MAGNESIUM SULFATE 2 GM/50ML IV SOLN
2.0000 g | Freq: Once | INTRAVENOUS | Status: AC
  Administered 2014-11-26: 2 g via INTRAVENOUS
  Filled 2014-11-26: qty 50

## 2014-11-26 MED ORDER — INSULIN ASPART 100 UNIT/ML ~~LOC~~ SOLN
0.0000 [IU] | Freq: Four times a day (QID) | SUBCUTANEOUS | Status: DC
  Administered 2014-11-27 – 2014-12-03 (×13): 1 [IU] via SUBCUTANEOUS
  Administered 2014-12-03: 2 [IU] via SUBCUTANEOUS

## 2014-11-26 MED ORDER — MORPHINE SULFATE 1 MG/ML IV SOLN
INTRAVENOUS | Status: DC
  Administered 2014-11-26: 4.5 mg via INTRAVENOUS
  Administered 2014-11-26: 1.5 mg via INTRAVENOUS
  Administered 2014-11-26: 08:00:00 via INTRAVENOUS
  Administered 2014-11-27: 4.5 mg via INTRAVENOUS
  Administered 2014-11-27: 6 mg via INTRAVENOUS
  Administered 2014-11-27: 1.5 mg via INTRAVENOUS
  Administered 2014-11-27: 3 mg via INTRAVENOUS
  Administered 2014-11-27: 11:00:00 via INTRAVENOUS
  Administered 2014-11-27: 6 mg via INTRAVENOUS
  Administered 2014-11-28: 3 mg via INTRAVENOUS
  Administered 2014-11-28: 16:00:00 via INTRAVENOUS
  Administered 2014-11-28: 5.89 mg via INTRAVENOUS
  Administered 2014-11-28: 4.5 mg via INTRAVENOUS
  Administered 2014-11-28: 3 mg via INTRAVENOUS
  Administered 2014-11-28: 1.5 mg via INTRAVENOUS
  Administered 2014-11-29: 15 mg via INTRAVENOUS
  Administered 2014-11-29: 09:00:00 via INTRAVENOUS
  Administered 2014-11-29: 6 mg via INTRAVENOUS
  Administered 2014-11-29: 1.5 mg via INTRAVENOUS
  Administered 2014-11-29: 6 mg via INTRAVENOUS
  Administered 2014-11-29 (×2): 4.5 mg via INTRAVENOUS
  Administered 2014-11-30: 6.5 mg via INTRAVENOUS
  Administered 2014-11-30: 7.5 mg via INTRAVENOUS
  Administered 2014-11-30: 1.5 mg via INTRAVENOUS
  Administered 2014-12-01: 4.5 mg via INTRAVENOUS
  Administered 2014-12-01: 6 mg via INTRAVENOUS
  Administered 2014-12-01: 7 mg via INTRAVENOUS
  Administered 2014-12-01: 20:00:00 via INTRAVENOUS
  Administered 2014-12-01: 7.5 mg via INTRAVENOUS
  Administered 2014-12-01: 3 mg via INTRAVENOUS
  Administered 2014-12-01: 7.5 mg via INTRAVENOUS
  Administered 2014-12-01: 4.5 mg via INTRAVENOUS
  Administered 2014-12-02: 6 mg via INTRAVENOUS
  Administered 2014-12-02: 16:00:00 via INTRAVENOUS
  Administered 2014-12-02: 1.5 mg via INTRAVENOUS
  Administered 2014-12-02 (×2): 6 mg via INTRAVENOUS
  Administered 2014-12-02 – 2014-12-03 (×2): 4.5 mg via INTRAVENOUS
  Filled 2014-11-26 (×9): qty 25

## 2014-11-26 MED ORDER — TRACE MINERALS CR-CU-MN-SE-ZN 10-1000-500-60 MCG/ML IV SOLN
INTRAVENOUS | Status: AC
  Administered 2014-11-26: 19:00:00 via INTRAVENOUS
  Filled 2014-11-26: qty 960

## 2014-11-26 MED ORDER — POTASSIUM CHLORIDE IN NACL 20-0.9 MEQ/L-% IV SOLN
INTRAVENOUS | Status: AC
  Administered 2014-11-26: 17:00:00 via INTRAVENOUS
  Filled 2014-11-26 (×2): qty 1000

## 2014-11-26 MED ORDER — PIPERACILLIN-TAZOBACTAM 3.375 G IVPB
3.3750 g | Freq: Three times a day (TID) | INTRAVENOUS | Status: DC
  Administered 2014-11-26 – 2014-12-05 (×26): 3.375 g via INTRAVENOUS
  Filled 2014-11-26 (×28): qty 50

## 2014-11-26 MED ORDER — PANTOPRAZOLE SODIUM 40 MG IV SOLR
40.0000 mg | Freq: Two times a day (BID) | INTRAVENOUS | Status: DC
  Administered 2014-11-26 – 2014-12-04 (×17): 40 mg via INTRAVENOUS
  Filled 2014-11-26 (×19): qty 40

## 2014-11-26 MED ORDER — DIPHENHYDRAMINE HCL 50 MG/ML IJ SOLN: 12.5000 mg | Freq: Four times a day (QID) | INTRAMUSCULAR | Status: DC | PRN

## 2014-11-26 MED ORDER — POTASSIUM PHOSPHATES 15 MMOLE/5ML IV SOLN
15.0000 mmol | Freq: Once | INTRAVENOUS | Status: AC
  Administered 2014-11-26: 15 mmol via INTRAVENOUS
  Filled 2014-11-26: qty 5

## 2014-11-26 MED ORDER — ONDANSETRON HCL 4 MG/2ML IJ SOLN: 4.0000 mg | Freq: Four times a day (QID) | INTRAMUSCULAR | Status: DC | PRN

## 2014-11-26 MED ORDER — DIPHENHYDRAMINE HCL 12.5 MG/5ML PO ELIX: 12.5000 mg | ORAL_SOLUTION | Freq: Four times a day (QID) | ORAL | Status: DC | PRN

## 2014-11-26 MED ORDER — PIPERACILLIN-TAZOBACTAM 3.375 G IVPB 30 MIN
3.3750 g | INTRAVENOUS | Status: AC
  Administered 2014-11-26: 3.375 g via INTRAVENOUS
  Filled 2014-11-26: qty 50

## 2014-11-26 MED ORDER — FAT EMULSION 20 % IV EMUL
240.0000 mL | INTRAVENOUS | Status: AC
  Administered 2014-11-26: 240 mL via INTRAVENOUS
  Filled 2014-11-26: qty 250

## 2014-11-26 MED ORDER — SODIUM CHLORIDE 0.9 % IJ SOLN: 9.0000 mL | INTRAMUSCULAR | Status: DC | PRN

## 2014-11-26 MED ORDER — NALOXONE HCL 0.4 MG/ML IJ SOLN: 0.4000 mg | INTRAMUSCULAR | Status: DC | PRN

## 2014-11-26 MED ORDER — SODIUM CHLORIDE 0.9 % IJ SOLN
10.0000 mL | INTRAMUSCULAR | Status: DC | PRN
  Administered 2014-11-27 – 2014-12-04 (×7): 10 mL
  Filled 2014-11-26 (×7): qty 40

## 2014-11-26 NOTE — Progress Notes (Signed)
2 Days Post-Op  Subjective: Patient had a couple of small episodes of emesis ("Spitting up") when NG was clamped and she was trying to take PO meds. NG is functioning - output decreased Patient states that she is feeling better than yesterday No flatus yet but abdomen feels less distended Pretty sleepy with Dilaudid  Objective: Vital signs in last 24 hours: Temp:  [98.3 F (36.8 C)-101.2 F (38.4 C)] 98.9 F (37.2 C) (07/15 0650) Pulse Rate:  [97-107] 98 (07/15 0650) Resp:  [10-28] 18 (07/15 0650) BP: (126-150)/(62-72) 144/68 mmHg (07/15 0650) SpO2:  [97 %-100 %] 100 % (07/15 0650) Last BM Date: 11/24/14  Intake/Output from previous day: 07/14 0701 - 07/15 0700 In: 2770 [P.O.:210; I.V.:2410; IV Piggyback:150] Out: 21 [Urine:2100; Emesis/NG output:500] Intake/Output this shift:    General appearance: alert, cooperative and no distress Resp: clear to auscultation bilaterally Cardio: regular rate and rhythm, S1, S2 normal, no murmur, click, rub or gallop GI: less distended; hypoactive bowel sounds; midline tenderness VAC with good seal  Lab Results:   Recent Labs  11/25/14 0452 11/26/14 0435  WBC 27.7* 17.9*  HGB 11.7* 10.7*  HCT 36.7 33.1*  PLT 259 208   BMET  Recent Labs  11/25/14 0452 11/26/14 0435  NA 136 135  K 3.8 3.4*  CL 102 103  CO2 26 24  GLUCOSE 151* 118*  BUN 13 9  CREATININE 0.79 0.58  CALCIUM 8.6* 8.6*   PT/INR No results for input(s): LABPROT, INR in the last 72 hours. ABG No results for input(s): PHART, HCO3 in the last 72 hours.  Invalid input(s): PCO2, PO2  Studies/Results: No results found.  Anti-infectives: Anti-infectives    Start     Dose/Rate Route Frequency Ordered Stop   11/24/14 2200  cefOXitin (MEFOXIN) 1 g in dextrose 5 % 50 mL IVPB     1 g 100 mL/hr over 30 Minutes Intravenous Every 6 hours 11/24/14 1601 11/25/14 1127   11/24/14 0822  cefOXitin (MEFOXIN) 2 g in dextrose 5 % 50 mL IVPB     2 g 100 mL/hr over 30  Minutes Intravenous On call to O.R. 11/24/14 0822 11/24/14 1425      Assessment/Plan: s/p Procedure(s): EXPLORATORY LAPAROTOMY EXTENSIVE LYSIS OF ADHESIONS SAMLL BOWEL RESECTION RIGHT HEMI COLECTOMY WOUND VAC APPLICATION.  (N/A) SMALL BOWEL RESECTION (N/A) LYSIS OF ADHESION (3 HRS) RIGHT HEMI COLECTOMY APPLICATION OF WOUND VAC Post-op ileus - awaiting return of bowel function  VAC change today PICC line/ TNA  LOS: 2 days    April May K. 11/26/2014

## 2014-11-26 NOTE — Progress Notes (Addendum)
Pt's latest temp. 101Fr..Family also concern about the patients blood pressure medicine since she didn't took the morning dose ,because she is having problem swallowing,notified on call health care provider ordered oral Tylenol and no orders given for blood pressure medicines since the blood pressure is with in normal range as per PA.

## 2014-11-26 NOTE — Progress Notes (Signed)
PARENTERAL NUTRITION CONSULT NOTE - INITIAL  Pharmacy Consult for TPN Indication: prolonged post-operative ileus  Allergies  Allergen Reactions  . Lisinopril     cough  . Crestor [Rosuvastatin Calcium]     Muscle and joint aches  . Lipitor [Atorvastatin Calcium]     Muscle cramps  . Azathioprine Other (See Comments)    Side effects to pancrease       Patient Measurements: Height: 5\' 3"  (160 cm) Weight: 205 lb 9 oz (93.243 kg) IBW/kg (Calculated) : 52.4 Adjusted Body Weight: 65 kg   Vital Signs: Temp: 98.9 F (37.2 C) (07/15 0650) Temp Source: Oral (07/15 0650) BP: 144/68 mmHg (07/15 0650) Pulse Rate: 98 (07/15 0650) Intake/Output from previous day: 07/14 0701 - 07/15 0700 In: 2770 [P.O.:210; I.V.:2410; IV Piggyback:150] Out: 9 [Urine:2100; Emesis/NG output:500] Intake/Output from this shift:    Labs:  Recent Labs  11/24/14 1622 11/25/14 0452 11/26/14 0435  WBC 21.2* 27.7* 17.9*  HGB 12.6 11.7* 10.7*  HCT 39.3 36.7 33.1*  PLT 305 259 208     Recent Labs  11/24/14 1406 11/24/14 1622 11/25/14 0452 11/26/14 0435  NA 133*  --  136 135  K 3.1*  --  3.8 3.4*  CL  --   --  102 103  CO2  --   --  26 24  GLUCOSE 158*  --  151* 118*  BUN  --   --  13 9  CREATININE  --  0.86 0.79 0.58  CALCIUM  --   --  8.6* 8.6*  MG  --   --   --  1.5*  PHOS  --   --   --  2.2*  PROT  --   --   --  6.2*  ALBUMIN  --   --   --  2.9*  AST  --   --   --  17  ALT  --   --   --  12*  ALKPHOS  --   --   --  45  BILITOT  --   --   --  0.3  BILIDIR  --   --   --  0.1  IBILI  --   --   --  0.2*  Corrected calcium 9.5 Estimated Creatinine Clearance: 82.1 mL/min (by C-G formula based on Cr of 0.58).    Recent Labs  11/24/14 1446 11/24/14 1656 11/24/14 1950  GLUCAP 156* 206* 189*    Medical History: Past Medical History  Diagnosis Date  . Allergy   . Depression   . Hypertension   . Crohn disease   . Sleep apnea     does not use cpap since weight loss 1 and  1/2 years ago  . Diabetes mellitus type II     borderline, md checks hemaglobin A 1 c at md office    Insulin Requirements:  No insulin currently ordered.  Hx borderline DM 2 noted. Reportedly on no antihyperglycemic medications PTA.   Current Nutrition:  NPO   IVF:  NS + KCl 66mEq/L at 100 mL/hr  Central access: PICC placement ordered for 7/15  TPN start date: to begin 7/15 provided that PICC is in place  ASSESSMENT  HPI: 59 y/o F with long history of Crohn's disease and s/p bowel resection ~ 30 years ago presented with persistent phlegmonous mass in abdomen/pelvis, clumped distal small bowel loops, and abscesses.  On 7/13 underwent extensive lysis of adhesions, SB resection, R hemicolectomy, placement of abd wound vac.  On 7/15 had emesis after attempting intermittent NG clamping. Given extent of surgery anticipate post-operative ileus will be prolonged. Orders received to begin TPN with pharmacy dosing assistance.  Significant events:  7/13 surgery as outlined above 7/15 for PICC placement and TPN start  Today:    Glucose - slightly elevated; CBGs immediately post-op were 150-200  Electrolytes - K, Mg, Phos all low at baseline  Renal - SCr and BUN WNL  LFTs - all below ULN  TGs - ordered  Prealbumin - ordered  NUTRITIONAL GOALS                                                                                             Preliminary goals (RD recs are pending): 70 - 80 grams protein / day 1600 - 1700 KCal/day Clinimix-E 5/15 at 70 mL/hr + Fat Emulsion 20% at 10 mL/hr would deliver 84 grams protein/day, 1672 KCal / day  PLAN                                                                                                                         1. KPhos 15 mM IV x 1 today 2. Magnesium 2 grams IV x 1 today 3. If PICC successfully placed today, at 1800:  Start  Clinimix-E 5/15 at 40 mL/hr.  Start 20% fat emulsion at 10 mL/hr.  TPN to contain standard multivitamins and trace elements.  Reduce IVF to 50 mL/hr  Resume CBGs q6h and begin coverage with SSI; start with sensitive-scale and can advance this tomorrow if necessary.  4. Full TPN lab panel tomorrow, then routinely on Mondays & Thursdays. 5. Follow K, Mg, Phos daily for now due to risk for refeeding syndrome. 6. F/U daily.  Clayburn Pert, PharmD, BCPS Pager: [phone] 11/26/2014  8:50 AM

## 2014-11-26 NOTE — Progress Notes (Signed)
2 Days Post-Op  Subjective: Having fever,feels bad all over.  Says she recently had some respiratory issues also. NG is dark colored will check for occult blood.  Objective: Vital signs in last 24 hours: Temp:  [98.9 F (37.2 C)-102.6 F (39.2 C)] 102.6 F (39.2 C) (07/15 1404) Pulse Rate:  [98-107] 101 (07/15 1404) Resp:  [13-30] 30 (07/15 1404) BP: (133-150)/(60-72) 133/60 mmHg (07/15 1404) SpO2:  [97 %-100 %] 99 % (07/15 1404) Last BM Date: 11/24/14 NG 500 PO 210 Temp up to 102.6 WBC better down to 17.9 this AM  No films Intake/Output from previous day: 07/14 0701 - 07/15 0700 In: 2770 [P.O.:210; I.V.:2410; IV Piggyback:150] Out: 65 [Urine:2100; Emesis/NG output:500] Intake/Output this shift: Total I/O In: -  Out: 600 [Urine:300; Emesis/NG output:300]  General appearance: alert, cooperative, no distress and clearly feels bad. Resp: clear to auscultation bilaterally GI: soft, wound vac in place, no BS.     She is not distended or overly tender with peritonitis.   Wound vac changed earlier by WOC:  Measurement:16cm x 6cm x 4.5cm Wound bed:red, moist with minor serosanguinous exudate and minimal bleeding. Drainage (amount, consistency, odor) as noted above Periwound:intact, dry  This is from [place]  Lab Results:   Recent Labs  11/25/14 0452 11/26/14 0435  WBC 27.7* 17.9*  HGB 11.7* 10.7*  HCT 36.7 33.1*  PLT 259 208    BMET  Recent Labs  11/25/14 0452 11/26/14 0435  NA 136 135  K 3.8 3.4*  CL 102 103  CO2 26 24  GLUCOSE 151* 118*  BUN 13 9  CREATININE 0.79 0.58  CALCIUM 8.6* 8.6*   PT/INR No results for input(s): LABPROT, INR in the last 72 hours.   Recent Labs Lab 11/26/14 0435  AST 17  ALT 12*  ALKPHOS 45  BILITOT 0.3  PROT 6.2*  ALBUMIN 2.9*     Lipase     Component Value Date/Time   LIPASE 32 05/19/2014 1816     Studies/Results: No results found.  Medications: . acetaminophen  650 mg Oral Once  . antiseptic oral rinse  7  mL Mouth Rinse q12n4p  . buPROPion  100 mg Oral BID  . chlorhexidine  15 mL Mouth Rinse BID  . irbesartan  300 mg Oral Daily   And  . chlorthalidone  25 mg Oral Daily  . enoxaparin (LOVENOX) injection  40 mg Subcutaneous Q24H  . [START ON 11/27/2014] insulin aspart  0-9 Units Subcutaneous 4 times per day  . lamoTRIgine  200 mg Oral QHS  . magnesium sulfate 1 - 4 g bolus IVPB  2 g Intravenous Once  . morphine   Intravenous 6 times per day  . potassium phosphate IVPB (mmol)  15 mmol Intravenous Once   . 0.9 % NaCl with KCl 20 mEq / L 1,000 mL (11/26/14 0112)  . 0.9 % NaCl with KCl 20 mEq / L    . Marland KitchenTPN (CLINIMIX-E) Adult     And  . fat emulsion      Assessment/Plan Crohns disease; small bowel/ right colon phlegmon with intra-abdominal abscess and possible fistulae S/p Exploratory laparotomy, extensive lysis of adhesions, SBR, right hemicolectomy, wound vac placement, 11/24/14, Dr. Donnie Mesa Hypertension AODM Type II  Hypokalemia Malnutrition on TNA Body mass index is 36.42   Plan:  Check urine, cxr, blood cultures,  check NG for occult blood, start Zosyn recheck labs in AM.   Add PPI.  Discussed with Dr. Hassell Done.  LOS: 2 days    Liban Guedes 11/26/2014

## 2014-11-26 NOTE — Progress Notes (Addendum)
Pt vomited after giving crushed tylenol ,Pt stated " i wanna go to [facility]" ... Family decided to hold the decision until tomorrow after they talk to the Doctor... Family still insist to have something for her elevated temperature ,notified on call health care provider and ordered IV Tylenol.

## 2014-11-26 NOTE — Progress Notes (Signed)
Initial Nutrition Assessment  DOCUMENTATION CODES:   Obesity unspecified  INTERVENTION:   TPN per Pharmacy Recommend monitor magnesium, potassium, and phosphorus daily for at least 3 days, MD to replete as needed, as pt is at risk for refeeding syndrome given significant weight loss PTA and suspect poor PO intake.  RD to continue to monitor   NUTRITION DIAGNOSIS:   Inadequate oral intake related to inability to eat as evidenced by NPO status.  GOAL:   Patient will meet greater than or equal to 90% of their needs  MONITOR:   Diet advancement, Labs, Weight trends, Skin, I & O's, Other (Comment) (TPN)  REASON FOR ASSESSMENT:   Consult New TPN/TNA  ASSESSMENT:   59 year old female with a very long history of Crohn's disease who is status post bowel resection about 30 years ago.  S/p 7/13 Procedure(s) (LRB): EXPLORATORY LAPAROTOMY EXTENSIVE LYSIS OF ADHESIONS SAMLL BOWEL RESECTION RIGHT HEMI COLECTOMY WOUND VAC APPLICATION. (N/A) SMALL BOWEL RESECTION (N/A) LYSIS OF ADHESION (3 HRS) RIGHT HEMI COLECTOMY APPLICATION OF WOUND VAC  Pt currently NPO for post-op ileus. TPN to begin today. Pt in room with daughter at bedside. Pt did not provide much history, appeared uncomfortable. Per daughter, pt was eating well and anything she wanted prior to surgery 2 days ago. Pt has had weight loss 22 lb since 1/11 (10% weight loss x 6 months, significant for time frame). Suspect that intake was not as good as reported d/t weight loss and labs (Mg,K,Phos). Would monitor for refeeding risk.  Nutrition focused physical exam shows no sign of depletion of muscle mass or body fat.  Labs reviewed: CBGs: 156-206 Low K, Mg, Phos  Diet Order:  Diet NPO time specified Except for: Ice Chips, Sips with Meds TPN (CLINIMIX-E) Adult  Skin:  Wound (see comment) (surgical incision)  Last BM:  7/13  Height:   Ht Readings from Last 1 Encounters:  11/24/14 5\' 3"  (1.6 m)    Weight:   Wt  Readings from Last 1 Encounters:  11/24/14 205 lb 9 oz (93.243 kg)    Ideal Body Weight:  52.3 kg  Wt Readings from Last 10 Encounters:  11/24/14 205 lb 9 oz (93.243 kg)  11/19/14 205 lb 9.6 oz (93.26 kg)  06/09/14 220 lb (99.791 kg)  05/24/14 227 lb 11.8 oz (103.3 kg)  04/06/14 212 lb 1.3 oz (96.2 kg)  03/11/14 219 lb (99.338 kg)  01/12/14 217 lb (98.431 kg)  09/14/13 220 lb (99.791 kg)  03/28/13 216 lb 3 oz (98.062 kg)  03/04/13 215 lb (97.523 kg)    BMI:  Body mass index is 36.42 kg/(m^2).  Estimated Nutritional Needs:   Kcal:  1600-1800  Protein:  70-80g  Fluid:  1.7L/day  EDUCATION NEEDS:   No education needs identified at this time  Clayton Bibles, MS, RD, LDN Pager: [phone] After Hours Pager: [phone]

## 2014-11-26 NOTE — Care Management Important Message (Signed)
Important Message  Patient Details  Name: April May MRN: [id number] Date of Birth: 13-Apr-1956   Medicare Important Message Given:  [facility] notification given    Camillo Flaming 11/26/2014, 12:54 [place] Message  Patient Details  Name: April May MRN: [id number] Date of Birth: December 05, 1955   Medicare Important Message Given:  Yes-second notification given    Camillo Flaming 11/26/2014, 12:54 PM

## 2014-11-26 NOTE — Consult Note (Signed)
WOC wound consult note Reason for Consult:NPWT initial dressing change. Wound type:Surgical Pressure Ulcer POA: No Measurement:16cm x 6cm x 4.5cm Wound bed:red, moist with minor serosanguinous exudate and minimal bleeding. Drainage (amount, consistency, odor) as noted above Periwound:intact, dry Dressing procedure/placement/frequency:NPWT dressing changed per Dr. Vonna Kotyk request.  Bedside RN may perform hereafter.  Old dressing and 1 piece of black foam removed from defect.Wound cleansed with NS, patted dry. 1 piece of black foam placed into defect, secured with drape.  Attached to suction at 163mmHg negative continuous pressure and tolerated well using PCA x2. [facility] nursing team will not follow, but will remain available to this patient, the nursing, surgical and medical teams.  Please re-consult if needed. Thanks, Maudie Flakes, MSN, RN, [place], [place], [place] [phone])

## 2014-11-26 NOTE — Progress Notes (Signed)
ANTIBIOTIC CONSULT NOTE - INITIAL  Pharmacy Consult for Zosyn Indication: Intra-abdominal infection  Allergies  Allergen Reactions  . Lisinopril     cough  . Crestor [Rosuvastatin Calcium]     Muscle and joint aches  . Lipitor [Atorvastatin Calcium]     Muscle cramps  . Azathioprine Other (See Comments)    Side effects to pancrease       Patient Measurements: Height: 5\' 3"  (160 cm) Weight: 205 lb 9 oz (93.243 kg) IBW/kg (Calculated) : 52.4 Adjusted Body Weight:   Vital Signs: Temp: 102.6 F (39.2 C) (07/15 1404) Temp Source: Oral (07/15 1404) BP: 133/60 mmHg (07/15 1404) Pulse Rate: 101 (07/15 1404) Intake/Output from previous day: 07/14 0701 - 07/15 0700 In: 2770 [P.O.:210; I.V.:2410; IV Piggyback:150] Out: 37 [Urine:2100; Emesis/NG output:500] Intake/Output from this shift: Total I/O In: -  Out: 600 [Urine:300; Emesis/NG output:300]  Labs:  Recent Labs  11/24/14 1622 11/25/14 0452 11/26/14 0435  WBC 21.2* 27.7* 17.9*  HGB 12.6 11.7* 10.7*  PLT 305 259 208  CREATININE 0.86 0.79 0.58   Estimated Creatinine Clearance: 82.1 mL/min (by C-G formula based on Cr of 0.58). No results for input(s): VANCOTROUGH, VANCOPEAK, VANCORANDOM, GENTTROUGH, GENTPEAK, GENTRANDOM, TOBRATROUGH, TOBRAPEAK, TOBRARND, AMIKACINPEAK, AMIKACINTROU, AMIKACIN in the last 72 hours.   Microbiology: No results found for this or any previous visit (from the past 720 hour(s)).  Medical History: Past Medical History  Diagnosis Date  . Allergy   . Depression   . Hypertension   . Crohn disease   . Sleep apnea     does not use cpap since weight loss 1 and 1/2 years ago  . Diabetes mellitus type II     borderline, md checks hemaglobin A 1 c at md office    Assessment: 50 yoF with hx Crohn's disease s/p bowel resection 30 years ago presents with inflamed bowel with 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  Pt  underwent exploratory laparotomy, LOA, small bowel resection, right hemicolectomy, and wound vac placement on 7/13.  TNA started 7/15 for ileus.  Pharmacy now consulted to start zosyn 7/15 for fevers and possible intra-abdominal infection.    No issues noted with renal function.  WBC elevated, but is improved from yesterday.  Tm24h: 102.6  7/15 >> Zosyn >>  7/15 bloodx2: ordered 7/15 urine: ordered   Goal of Therapy:  Eradication of infection  Plan:  Zosyn 3.375g IV q8h (infuse over 4 hours) F/u cultures, renal function, clinical course  Ralene Bathe, PharmD, BCPS 11/26/2014, 3:08 PM  Pager: [phone]

## 2014-11-26 NOTE — Progress Notes (Signed)
Peripherally Inserted Central Catheter/Midline Placement  The IV Nurse has discussed with the patient and/or persons authorized to consent for the patient, the purpose of this procedure and the potential benefits and risks involved with this procedure.  The benefits include less needle sticks, lab draws from the catheter and patient may be discharged home with the catheter.  Risks include, but not limited to, infection, bleeding, blood clot (thrombus formation), and puncture of an artery; nerve damage and irregular heat beat.  Alternatives to this procedure were also discussed.  PICC/Midline Placement Documentation        Darlyn Read 11/26/2014, 6:09 PM

## 2014-11-26 NOTE — Telephone Encounter (Signed)
Called patient to see if a recent mammogram has been done, left voice message.

## 2014-11-27 LAB — COMPREHENSIVE METABOLIC PANEL
ALBUMIN: 2.5 g/dL — AB (ref 3.5–5.0)
ALK PHOS: 42 U/L (ref 38–126)
ALT: 10 U/L — ABNORMAL LOW (ref 14–54)
ANION GAP: 8 (ref 5–15)
AST: 15 U/L (ref 15–41)
BUN: 10 mg/dL (ref 6–20)
CALCIUM: 8.2 mg/dL — AB (ref 8.9–10.3)
CO2: 30 mmol/L (ref 22–32)
CREATININE: 0.7 mg/dL (ref 0.44–1.00)
Chloride: 97 mmol/L — ABNORMAL LOW (ref 101–111)
GFR calc non Af Amer: >60 mL/min (ref >60)
GLUCOSE: 126 mg/dL — AB (ref 65–99)
Potassium: 3 mmol/L — ABNORMAL LOW (ref 3.5–5.1)
Sodium: 135 mmol/L (ref 135–145)
Total Bilirubin: 0.3 mg/dL (ref 0.3–1.2)
Total Protein: 5.9 g/dL — ABNORMAL LOW (ref 6.5–8.1)

## 2014-11-27 LAB — DIFFERENTIAL
Basophils Absolute: 0 10*3/uL (ref 0.0–0.1)
Basophils Relative: 0 % (ref 0–1)
Eosinophils Absolute: 0 10*3/uL (ref 0.0–0.7)
Eosinophils Relative: 0 % (ref 0–5)
LYMPHS ABS: 1.2 10*3/uL (ref 0.7–4.0)
LYMPHS PCT: 7 % — AB (ref 12–46)
Monocytes Absolute: 1.1 10*3/uL — ABNORMAL HIGH (ref 0.1–1.0)
Monocytes Relative: 7 % (ref 3–12)
NEUTROS ABS: 14.5 10*3/uL — AB (ref 1.7–7.7)
Neutrophils Relative %: 86 % — ABNORMAL HIGH (ref 43–77)

## 2014-11-27 LAB — GLUCOSE, CAPILLARY
GLUCOSE-CAPILLARY: 114 mg/dL — AB (ref 65–99)
GLUCOSE-CAPILLARY: 118 mg/dL — AB (ref 65–99)
GLUCOSE-CAPILLARY: 122 mg/dL — AB (ref 65–99)
GLUCOSE-CAPILLARY: 127 mg/dL — AB (ref 65–99)

## 2014-11-27 LAB — CBC
HEMATOCRIT: 28 % — AB (ref 36.0–46.0)
Hemoglobin: 9.3 g/dL — ABNORMAL LOW (ref 12.0–15.0)
MCH: 30.2 pg (ref 26.0–34.0)
MCHC: 33.2 g/dL (ref 30.0–36.0)
MCV: 90.9 fL (ref 78.0–100.0)
PLATELETS: 201 10*3/uL (ref 150–400)
RBC: 3.08 MIL/uL — AB (ref 3.87–5.11)
RDW: 14.1 % (ref 11.5–15.5)
WBC: 16.8 10*3/uL — AB (ref 4.0–10.5)

## 2014-11-27 LAB — TRIGLYCERIDES: Triglycerides: 171 mg/dL — ABNORMAL HIGH (ref <150)

## 2014-11-27 LAB — OCCULT BLOOD GASTRIC / DUODENUM (SPECIMEN CUP)
Occult Blood, Gastric: POSITIVE — AB
pH, Gastric: 4

## 2014-11-27 LAB — MAGNESIUM: Magnesium: 2 mg/dL (ref 1.7–2.4)

## 2014-11-27 LAB — PHOSPHORUS: PHOSPHORUS: 3.1 mg/dL (ref 2.5–4.6)

## 2014-11-27 LAB — PREALBUMIN: Prealbumin: 5.6 mg/dL — ABNORMAL LOW (ref 18–38)

## 2014-11-27 MED ORDER — BUPROPION HCL 100 MG PO TABS
100.0000 mg | ORAL_TABLET | Freq: Every day | ORAL | Status: DC
  Administered 2014-11-27 – 2014-12-05 (×9): 100 mg via ORAL
  Filled 2014-11-27 (×9): qty 1

## 2014-11-27 MED ORDER — FAT EMULSION 20 % IV EMUL
240.0000 mL | INTRAVENOUS | Status: AC
  Administered 2014-11-27: 240 mL via INTRAVENOUS
  Filled 2014-11-27: qty 250

## 2014-11-27 MED ORDER — POTASSIUM CHLORIDE 10 MEQ/50ML IV SOLN
10.0000 meq | INTRAVENOUS | Status: AC
  Administered 2014-11-27 (×6): 10 meq via INTRAVENOUS
  Filled 2014-11-27 (×6): qty 50

## 2014-11-27 MED ORDER — POTASSIUM PHOSPHATES 15 MMOLE/5ML IV SOLN
10.0000 mmol | Freq: Once | INTRAVENOUS | Status: DC
  Filled 2014-11-27: qty 3.33

## 2014-11-27 MED ORDER — POTASSIUM CHLORIDE IN NACL 20-0.9 MEQ/L-% IV SOLN
INTRAVENOUS | Status: AC
  Filled 2014-11-27: qty 1000

## 2014-11-27 MED ORDER — TRACE MINERALS CR-CU-MN-SE-ZN 10-1000-500-60 MCG/ML IV SOLN
INTRAVENOUS | Status: AC
  Administered 2014-11-27: 18:00:00 via INTRAVENOUS
  Filled 2014-11-27: qty 1320

## 2014-11-27 MED ORDER — POTASSIUM PHOSPHATES 15 MMOLE/5ML IV SOLN
10.0000 mmol | Freq: Once | INTRAVENOUS | Status: AC
  Administered 2014-11-27: 10 mmol via INTRAVENOUS
  Filled 2014-11-27: qty 3.33

## 2014-11-27 NOTE — Progress Notes (Signed)
Patient ID: April May, female   DOB: May 31, 1955, 59 y.o.   MRN: [id number] 3 Days Post-Op  Subjective: Says she is not doing well. Concerned about fever. Some expected incisional pain. Denies shortness of breath. Up out of bed. No flatus or bowel movements yet.  Objective: Vital signs in last 24 hours: Temp:  [99.8 F (37.7 C)-102.8 F (39.3 C)] 101.5 F (38.6 C) (07/16 4982) Pulse Rate:  [88-101] 88 (07/16 0608) Resp:  [16-30] 21 (07/16 0742) BP: (133-154)/(60-67) 137/67 mmHg (07/16 0608) SpO2:  [95 %-100 %] 95 % (07/16 0742) Last BM Date: 11/24/14  Intake/Output from previous day: 07/15 0701 - 07/16 0700 In: 1321.7 [P.O.:50; I.V.:634.2; IV Piggyback:100; TPN:537.5] Out: 2250 [Urine:1450; Emesis/NG output:800] Intake/Output this shift:    General appearance: alert, cooperative, no distress and moderately obese Resp: rhonchi bilaterally GI: normal findings: soft with minimal appropriate tenderness Incision/Wound: VAC in place without erythema or unusual drainage  Lab Results:   Recent Labs  11/26/14 0435 11/27/14 0455  WBC 17.9* 16.8*  HGB 10.7* 9.3*  HCT 33.1* 28.0*  PLT 208 201   BMET  Recent Labs  11/26/14 0435 11/27/14 0455  NA 135 135  K 3.4* 3.0*  CL 103 97*  CO2 24 30  GLUCOSE 118* 126*  BUN 9 10  CREATININE 0.58 0.70  CALCIUM 8.6* 8.2*     Studies/Results: Dg Chest 2 View  11/26/2014   CLINICAL DATA:  59 year old female with fever and soft tissue ulcer in the buttock region. Weakness and fatigue. Initial encounter.  EXAM: CHEST  2 VIEW  COMPARISON:  03/11/2014 and earlier.  FINDINGS: NG type tube in place, side hole at the level of the distal thoracic esophagus or a GJ (arrow). Lower lung volumes. Platelike left greater than right bilateral lower lobe opacity. No definite pleural effusion. No pneumothorax or pulmonary edema. Normal cardiac size and mediastinal contours. Negative visible bowel gas pattern. No acute osseous abnormality  identified.  IMPRESSION: 1. NG tube in place, advance 5 cm to place the side hole within the stomach. 2. Lower lung volumes with platelike opacity in both lower lobes, favor atelectasis over pneumonia.   Electronically Signed   By: Genevie Ann M.D.   On: 11/26/2014 16:37    Anti-infectives: Anti-infectives    Start     Dose/Rate Route Frequency Ordered Stop   11/26/14 2200  piperacillin-tazobactam (ZOSYN) IVPB 3.375 g     3.375 g 12.5 mL/hr over 240 Minutes Intravenous 3 times per day 11/26/14 1500     11/26/14 1500  piperacillin-tazobactam (ZOSYN) IVPB 3.375 g     3.375 g 100 mL/hr over 30 Minutes Intravenous NOW 11/26/14 1459 11/26/14 1714   11/24/14 2200  cefOXitin (MEFOXIN) 1 g in dextrose 5 % 50 mL IVPB     1 g 100 mL/hr over 30 Minutes Intravenous Every 6 hours 11/24/14 1601 11/25/14 1127   11/24/14 0822  cefOXitin (MEFOXIN) 2 g in dextrose 5 % 50 mL IVPB     2 g 100 mL/hr over 30 Minutes Intravenous On call to O.R. 11/24/14 0822 11/24/14 1425      Assessment/Plan: s/p Procedure(s): EXPLORATORY LAPAROTOMY EXTENSIVE LYSIS OF ADHESIONS SAMLL BOWEL RESECTION RIGHT HEMI COLECTOMY WOUND VAC APPLICATION.  SMALL BOWEL RESECTION LYSIS OF ADHESION (3 HRS) RIGHT HEMI COLECTOMY APPLICATION OF WOUND VAC Postoperative fever. Zosyn started yesterday. Chest x-ray shows atelectasis and I suspect this is the source of her fever. Abdomen seems benign. Discussed pulmonary toilet and this was encouraged and assisted  patient with coughing Continue NG, bowel rest. Check labs in a.m.   LOS: 3 days    Jezebel Pollet T 11/27/2014

## 2014-11-27 NOTE — Progress Notes (Signed)
PARENTERAL NUTRITION CONSULT NOTE - follow up  Pharmacy Consult for TPN Indication: prolonged post-operative ileus  Allergies  Allergen Reactions  . Lisinopril     cough  . Crestor [Rosuvastatin Calcium]     Muscle and joint aches  . Lipitor [Atorvastatin Calcium]     Muscle cramps  . Azathioprine Other (See Comments)    Side effects to pancrease       Patient Measurements: Height: 5\' 3"  (160 cm) Weight: 205 lb 9 oz (93.243 kg) IBW/kg (Calculated) : 52.4 Adjusted Body Weight: 65 kg   Vital Signs: Temp: 101.5 F (38.6 C) (07/16 0608) Temp Source: Oral (07/16 7035) BP: 137/67 mmHg (07/16 0093) Pulse Rate: 88 (07/16 0608) Intake/Output from previous day: 07/15 0701 - 07/16 0700 In: 1321.7 [P.O.:50; I.V.:634.2; IV Piggyback:100; TPN:537.5] Out: 2250 [Urine:1450; Emesis/NG output:800] Intake/Output from this shift:    Labs:  Recent Labs  11/25/14 0452 11/26/14 0435 11/27/14 0455  WBC 27.7* 17.9* 16.8*  HGB 11.7* 10.7* 9.3*  HCT 36.7 33.1* 28.0*  PLT 259 208 201     Recent Labs  11/25/14 0452 11/26/14 0435 11/27/14 0455  NA 136 135 135  K 3.8 3.4* 3.0*  CL 102 103 97*  CO2 26 24 30   GLUCOSE 151* 118* 126*  BUN 13 9 10   CREATININE 0.79 0.58 0.70  CALCIUM 8.6* 8.6* 8.2*  MG  --  1.5* 2.0  PHOS  --  2.2* 3.1  PROT  --  6.2* 5.9*  ALBUMIN  --  2.9* 2.5*  AST  --  17 15  ALT  --  12* 10*  ALKPHOS  --  45 42  BILITOT  --  0.3 0.3  BILIDIR  --  0.1  --   IBILI  --  0.2*  --   PREALBUMIN  --   --  5.6*  TRIG  --   --  171*  Corrected calcium 9.5 Estimated Creatinine Clearance: 82.1 mL/min (by C-G formula based on Cr of 0.7).    Recent Labs  11/24/14 1656 11/24/14 1950 11/26/14 2358  GLUCAP 206* 189* 122*    Medical History: Past Medical History  Diagnosis Date  . Allergy   . Depression   . Hypertension   . Crohn disease   . Sleep apnea     does not use cpap since weight loss 1 and 1/2 years ago  . Diabetes mellitus type II    borderline, md checks hemaglobin A 1 c at md office    Insulin Requirements: none in past 24 hours   Current Nutrition:  NPO   IVF:  NS + KCl 78mEq/L at 50 mL/hr  Central access: PICC placement ordered for 7/15  TPN start date: 7/15   ASSESSMENT                                                                                                          HPI: 59 y/o F with long history of Crohn's disease and s/p bowel resection ~ 30 years ago presented with persistent phlegmonous mass in abdomen/pelvis, clumped  distal small bowel loops, and abscesses.  On 7/13 underwent extensive lysis of adhesions, SB resection, R hemicolectomy, placement of abd wound vac.  On 7/15 had emesis after attempting intermittent NG clamping. Given extent of surgery anticipate post-operative ileus will be prolonged. Orders received to begin TPN with pharmacy dosing assistance.  Significant events:  7/13 surgery as outlined above 7/15 for PICC placement and TPN start  Today:    Glucose - <150  Electrolytes - K 3.0, Mg, Phos WNL after replenishment  Renal - SCr and BUN WNL  LFTs - all below ULN  TGs - 171  Prealbumin - 5.6  NUTRITIONAL GOALS                                                                                             RD recs: 70 - 80 grams protein / day 1600 - 1800 KCal/day Clinimix-E 5/15 at 70 mL/hr + Fat Emulsion 20% at 10 mL/hr would deliver 84 grams protein/day, 1672 KCal / day  PLAN                                                                                                                         1. KCl 10 x 6 2. Kphos 56mmol x 1   Today, at 1800:  Increase Clinimix-E 5/15 to 55 mL/hr.  Continue  20% fat emulsion at 10 mL/hr.  TPN to contain standard multivitamins and trace elements.  Advance TPN to goal as tolerated  Reduce IVF to 35 mL/hr  continue CBGs q6h and sensitive SSI;  4. TPN lab panel on Mon and Thur 5. Follow K, Mg, Phos daily for now due to risk for  refeeding syndrome. 6. F/U daily.  Dolly Rias RPh 11/27/2014, 9:40 AM Pager [phone]

## 2014-11-28 LAB — URINE CULTURE

## 2014-11-28 LAB — CBC
HCT: 27.4 % — ABNORMAL LOW (ref 36.0–46.0)
Hemoglobin: 9.2 g/dL — ABNORMAL LOW (ref 12.0–15.0)
MCH: 30.7 pg (ref 26.0–34.0)
MCHC: 33.6 g/dL (ref 30.0–36.0)
MCV: 91.3 fL (ref 78.0–100.0)
PLATELETS: 208 10*3/uL (ref 150–400)
RBC: 3 MIL/uL — ABNORMAL LOW (ref 3.87–5.11)
RDW: 13.9 % (ref 11.5–15.5)
WBC: 11.6 10*3/uL — AB (ref 4.0–10.5)

## 2014-11-28 LAB — BASIC METABOLIC PANEL
Anion gap: 6 (ref 5–15)
BUN: 10 mg/dL (ref 6–20)
CHLORIDE: 97 mmol/L — AB (ref 101–111)
CO2: 29 mmol/L (ref 22–32)
CREATININE: 0.58 mg/dL (ref 0.44–1.00)
Calcium: 8.1 mg/dL — ABNORMAL LOW (ref 8.9–10.3)
GFR calc non Af Amer: >60 mL/min (ref >60)
GLUCOSE: 144 mg/dL — AB (ref 65–99)
Potassium: 3.3 mmol/L — ABNORMAL LOW (ref 3.5–5.1)
Sodium: 132 mmol/L — ABNORMAL LOW (ref 135–145)

## 2014-11-28 LAB — GLUCOSE, CAPILLARY
GLUCOSE-CAPILLARY: 108 mg/dL — AB (ref 65–99)
Glucose-Capillary: 132 mg/dL — ABNORMAL HIGH (ref 65–99)
Glucose-Capillary: 137 mg/dL — ABNORMAL HIGH (ref 65–99)

## 2014-11-28 LAB — MAGNESIUM: Magnesium: 1.8 mg/dL (ref 1.7–2.4)

## 2014-11-28 LAB — PHOSPHORUS: Phosphorus: 3.4 mg/dL (ref 2.5–4.6)

## 2014-11-28 MED ORDER — FAT EMULSION 20 % IV EMUL
240.0000 mL | INTRAVENOUS | Status: AC
  Administered 2014-11-28: 240 mL via INTRAVENOUS
  Filled 2014-11-28: qty 250

## 2014-11-28 MED ORDER — MAGNESIUM SULFATE IN D5W 10-5 MG/ML-% IV SOLN
1.0000 g | Freq: Once | INTRAVENOUS | Status: AC
  Administered 2014-11-28: 1 g via INTRAVENOUS
  Filled 2014-11-28: qty 100

## 2014-11-28 MED ORDER — POTASSIUM CHLORIDE 10 MEQ/50ML IV SOLN
10.0000 meq | INTRAVENOUS | Status: AC
  Administered 2014-11-28 (×5): 10 meq via INTRAVENOUS
  Filled 2014-11-28 (×5): qty 50

## 2014-11-28 MED ORDER — TRACE MINERALS CR-CU-MN-SE-ZN 10-1000-500-60 MCG/ML IV SOLN
INTRAVENOUS | Status: AC
  Administered 2014-11-28: 18:00:00 via INTRAVENOUS
  Filled 2014-11-28: qty 1680

## 2014-11-28 MED ORDER — POTASSIUM CHLORIDE IN NACL 20-0.9 MEQ/L-% IV SOLN
INTRAVENOUS | Status: DC
  Filled 2014-11-28: qty 1000

## 2014-11-28 NOTE — Progress Notes (Signed)
PARENTERAL NUTRITION CONSULT NOTE - follow up  Pharmacy Consult for TPN Indication: prolonged post-operative ileus  Allergies  Allergen Reactions  . Lisinopril     cough  . Crestor [Rosuvastatin Calcium]     Muscle and joint aches  . Lipitor [Atorvastatin Calcium]     Muscle cramps  . Azathioprine Other (See Comments)    Side effects to pancrease       Patient Measurements: Height: 5\' 3"  (160 cm) Weight: 205 lb 9 oz (93.243 kg) IBW/kg (Calculated) : 52.4 Adjusted Body Weight: 65 kg   Vital Signs: Temp: 100.6 F (38.1 C) (07/17 0556) Temp Source: Oral (07/17 0556) BP: 125/47 mmHg (07/17 0556) Pulse Rate: 82 (07/17 0556) Intake/Output from previous day: 07/16 0701 - 07/17 0700 In: 2267.5 [I.V.:442.2; IV Piggyback:450; TPN:1375.3] Out: 1415 [Urine:1415] Intake/Output from this shift:    Labs:  Recent Labs  11/26/14 0435 11/27/14 0455 11/28/14 0340  WBC 17.9* 16.8* 11.6*  HGB 10.7* 9.3* 9.2*  HCT 33.1* 28.0* 27.4*  PLT 208 201 208     Recent Labs  11/26/14 0435 11/27/14 0455 11/28/14 0340  NA 135 135 132*  K 3.4* 3.0* 3.3*  CL 103 97* 97*  CO2 24 30 29   GLUCOSE 118* 126* 144*  BUN 9 10 10   CREATININE 0.58 0.70 0.58  CALCIUM 8.6* 8.2* 8.1*  MG 1.5* 2.0 1.8  PHOS 2.2* 3.1 3.4  PROT 6.2* 5.9*  --   ALBUMIN 2.9* 2.5*  --   AST 17 15  --   ALT 12* 10*  --   ALKPHOS 45 42  --   BILITOT 0.3 0.3  --   BILIDIR 0.1  --   --   IBILI 0.2*  --   --   PREALBUMIN  --  5.6*  --   TRIG  --  171*  --   Corrected calcium 9.5 Estimated Creatinine Clearance: 82.1 mL/min (by C-G formula based on Cr of 0.58).    Recent Labs  11/27/14 1755 11/27/14 2352 11/28/14 0522  GLUCAP 118* 127* 132*    Medical History: Past Medical History  Diagnosis Date  . Allergy   . Depression   . Hypertension   . Crohn disease   . Sleep apnea     does not use cpap since weight loss 1 and 1/2 years ago  . Diabetes mellitus type II     borderline, md checks hemaglobin A  1 c at md office    Insulin Requirements: 2 units in past 24 hours   Current Nutrition:  NPO   IVF:  NS + KCl 89mEq/L at 35 mL/hr  Central access: PICC placement ordered for 7/15  TPN start date: 7/15   ASSESSMENT                                                                                                          HPI: 59 y/o F with long history of Crohn's disease and s/p bowel resection ~ 30 years ago presented with persistent phlegmonous mass in abdomen/pelvis, clumped distal small bowel loops,  and abscesses.  On 7/13 underwent extensive lysis of adhesions, SB resection, R hemicolectomy, placement of abd wound vac.  On 7/15 had emesis after attempting intermittent NG clamping. Given extent of surgery anticipate post-operative ileus will be prolonged. Orders received to begin TPN with pharmacy dosing assistance.  Significant events:  7/13 surgery as outlined above 7/15 for PICC placement and TPN start  Today:    Glucose - <150  Electrolytes - K 3.3, Mg 1.8, Phos WNL after replenishment  Renal - SCr and BUN WNL  LFTs - all below ULN  TGs - 171  Prealbumin - 5.6  NUTRITIONAL GOALS                                                                                             RD recs: 70 - 80 grams protein / day 1600 - 1800 KCal/day Clinimix-E 5/15 at 70 mL/hr + Fat Emulsion 20% at 10 mL/hr would deliver 84 grams protein/day, 1672 KCal / day  PLAN                                                                                                                         1. KCl 10 x 5 2. Mag 1gm x 1   Today, at 1800:  Increase Clinimix-E 5/15 to 70 mL/hr (goal)  Continue  20% fat emulsion at 10 mL/hr.  TPN to contain standard multivitamins and trace elements.  Advance TPN to goal as tolerated  Reduce IVF to 20 mL/hr  continue CBGs q6h and sensitive SSI;    TPN lab panel on Mon and Thur   Follow K, Mg, Phos daily for now due to risk for refeeding syndrome.    F/U daily.  Dolly Rias RPh 11/28/2014, 8:13 AM Pager [phone]

## 2014-11-28 NOTE — Progress Notes (Signed)
Patient ID: April May, female   DOB: Jul 18, 1955, 59 y.o.   MRN: [id number] Patient ID: April May, female   DOB: Jun 11, 1955, 59 y.o.   MRN: [id number] 4 Days Post-Op  Subjective: Feels some better today. Some expected incisional pain. Denies shortness of breath. Up out of bed. No flatus or bowel movements yet, but feels rumbling.  Objective: Vital signs in last 24 hours: Temp:  [98.4 F (36.9 C)-102.5 F (39.2 C)] 100.6 F (38.1 C) (07/17 0556) Pulse Rate:  [82-88] 82 (07/17 0556) Resp:  [12-22] 22 (07/17 0739) BP: (114-148)/(47-76) 125/47 mmHg (07/17 0556) SpO2:  [97 %-100 %] 98 % (07/17 0739) Last BM Date: 11/24/14  Intake/Output from previous day: 07/16 0701 - 07/17 0700 In: 2267.5 [I.V.:442.2; IV Piggyback:450; TPN:1375.3] Out: 1415 [Urine:1415] Intake/Output this shift:    General appearance: alert, cooperative, no distress and moderately obese Resp: Clear anteriorly GI: normal findings: soft with minimal appropriate tenderness Incision/Wound: VAC in place without erythema or unusual drainage  Lab Results:   Recent Labs  11/27/14 0455 11/28/14 0340  WBC 16.8* 11.6*  HGB 9.3* 9.2*  HCT 28.0* 27.4*  PLT 201 208   BMET  Recent Labs  11/27/14 0455 11/28/14 0340  NA 135 132*  K 3.0* 3.3*  CL 97* 97*  CO2 30 29  GLUCOSE 126* 144*  BUN 10 10  CREATININE 0.70 0.58  CALCIUM 8.2* 8.1*     Studies/Results: Dg Chest 2 View  11/26/2014   CLINICAL DATA:  59 year old female with fever and soft tissue ulcer in the buttock region. Weakness and fatigue. Initial encounter.  EXAM: CHEST  2 VIEW  COMPARISON:  03/11/2014 and earlier.  FINDINGS: NG type tube in place, side hole at the level of the distal thoracic esophagus or a GJ (arrow). Lower lung volumes. Platelike left greater than right bilateral lower lobe opacity. No definite pleural effusion. No pneumothorax or pulmonary edema. Normal cardiac size and mediastinal contours. Negative visible bowel gas  pattern. No acute osseous abnormality identified.  IMPRESSION: 1. NG tube in place, advance 5 cm to place the side hole within the stomach. 2. Lower lung volumes with platelike opacity in both lower lobes, favor atelectasis over pneumonia.   Electronically Signed   By: Genevie Ann M.D.   On: 11/26/2014 16:37    Anti-infectives: Anti-infectives    Start     Dose/Rate Route Frequency Ordered Stop   11/26/14 2200  piperacillin-tazobactam (ZOSYN) IVPB 3.375 g     3.375 g 12.5 mL/hr over 240 Minutes Intravenous 3 times per day 11/26/14 1500     11/26/14 1500  piperacillin-tazobactam (ZOSYN) IVPB 3.375 g     3.375 g 100 mL/hr over 30 Minutes Intravenous NOW 11/26/14 1459 11/26/14 1714   11/24/14 2200  cefOXitin (MEFOXIN) 1 g in dextrose 5 % 50 mL IVPB     1 g 100 mL/hr over 30 Minutes Intravenous Every 6 hours 11/24/14 1601 11/25/14 1127   11/24/14 0822  cefOXitin (MEFOXIN) 2 g in dextrose 5 % 50 mL IVPB     2 g 100 mL/hr over 30 Minutes Intravenous On call to O.R. 11/24/14 0822 11/24/14 1425      Assessment/Plan: s/p Procedure(s): EXPLORATORY LAPAROTOMY EXTENSIVE LYSIS OF ADHESIONS SAMLL BOWEL RESECTION RIGHT HEMI COLECTOMY WOUND VAC APPLICATION.  SMALL BOWEL RESECTION LYSIS OF ADHESION (3 HRS) RIGHT HEMI COLECTOMY APPLICATION OF WOUND VAC Postoperative fever, somewhat improved. Zosyn started 7/15.  Leukocytosis improved.  Chest x-ray shows atelectasis and I suspect this is  the source of her fever. Abdomen seems benign. Discussed pulmonary toilet and this was encouraged and assisted patient with coughing Continue NG, bowel rest. Check labs in a.m.   LOS: 4 days    April May T 11/28/2014

## 2014-11-29 LAB — GLUCOSE, CAPILLARY
Glucose-Capillary: 129 mg/dL — ABNORMAL HIGH (ref 65–99)
Glucose-Capillary: 138 mg/dL — ABNORMAL HIGH (ref 65–99)
Glucose-Capillary: 104 mg/dL — ABNORMAL HIGH (ref 65–99)

## 2014-11-29 LAB — CBC
HCT: 27.4 % — ABNORMAL LOW (ref 36.0–46.0)
Hemoglobin: 9.3 g/dL — ABNORMAL LOW (ref 12.0–15.0)
MCH: 30.8 pg (ref 26.0–34.0)
MCHC: 33.9 g/dL (ref 30.0–36.0)
MCV: 90.7 fL (ref 78.0–100.0)
Platelets: 221 10*3/uL (ref 150–400)
RBC: 3.02 MIL/uL — ABNORMAL LOW (ref 3.87–5.11)
RDW: 14.1 % (ref 11.5–15.5)
WBC: 10.2 10*3/uL (ref 4.0–10.5)

## 2014-11-29 LAB — COMPREHENSIVE METABOLIC PANEL
ALT: 11 U/L — ABNORMAL LOW (ref 14–54)
AST: 17 U/L (ref 15–41)
Albumin: 2.5 g/dL — ABNORMAL LOW (ref 3.5–5.0)
Alkaline Phosphatase: 53 U/L (ref 38–126)
Anion gap: 7 (ref 5–15)
BUN: 11 mg/dL (ref 6–20)
CHLORIDE: 99 mmol/L — AB (ref 101–111)
CO2: 27 mmol/L (ref 22–32)
CREATININE: 0.59 mg/dL (ref 0.44–1.00)
Calcium: 8.1 mg/dL — ABNORMAL LOW (ref 8.9–10.3)
GFR calc Af Amer: >60 mL/min (ref >60)
GFR calc non Af Amer: >60 mL/min (ref >60)
Glucose, Bld: 141 mg/dL — ABNORMAL HIGH (ref 65–99)
POTASSIUM: 3.2 mmol/L — AB (ref 3.5–5.1)
Sodium: 133 mmol/L — ABNORMAL LOW (ref 135–145)
TOTAL PROTEIN: 6 g/dL — AB (ref 6.5–8.1)
Total Bilirubin: 0.2 mg/dL — ABNORMAL LOW (ref 0.3–1.2)

## 2014-11-29 LAB — DIFFERENTIAL
Basophils Absolute: 0 10*3/uL (ref 0.0–0.1)
Basophils Relative: 0 % (ref 0–1)
EOS ABS: 0.2 10*3/uL (ref 0.0–0.7)
Eosinophils Relative: 2 % (ref 0–5)
LYMPHS PCT: 12 % (ref 12–46)
Lymphs Abs: 1.2 10*3/uL (ref 0.7–4.0)
MONOS PCT: 11 % (ref 3–12)
Monocytes Absolute: 1.1 10*3/uL — ABNORMAL HIGH (ref 0.1–1.0)
NEUTROS ABS: 7.7 10*3/uL (ref 1.7–7.7)
NEUTROS PCT: 75 % (ref 43–77)

## 2014-11-29 LAB — PHOSPHORUS: Phosphorus: 3.9 mg/dL (ref 2.5–4.6)

## 2014-11-29 LAB — MAGNESIUM: Magnesium: 1.8 mg/dL (ref 1.7–2.4)

## 2014-11-29 LAB — PREALBUMIN: Prealbumin: 6.3 mg/dL — ABNORMAL LOW (ref 18–38)

## 2014-11-29 LAB — TRIGLYCERIDES: Triglycerides: 226 mg/dL — ABNORMAL HIGH (ref <150)

## 2014-11-29 MED ORDER — TRACE MINERALS CR-CU-MN-SE-ZN 10-1000-500-60 MCG/ML IV SOLN
INTRAVENOUS | Status: AC
  Administered 2014-11-29: 18:00:00 via INTRAVENOUS
  Filled 2014-11-29: qty 1680

## 2014-11-29 MED ORDER — POTASSIUM CHLORIDE 10 MEQ/50ML IV SOLN
10.0000 meq | INTRAVENOUS | Status: AC
  Administered 2014-11-29 (×4): 10 meq via INTRAVENOUS
  Filled 2014-11-29 (×4): qty 50

## 2014-11-29 MED ORDER — POTASSIUM CHLORIDE IN NACL 20-0.9 MEQ/L-% IV SOLN
INTRAVENOUS | Status: AC
  Filled 2014-11-29: qty 1000

## 2014-11-29 MED ORDER — BISACODYL 10 MG RE SUPP
10.0000 mg | Freq: Once | RECTAL | Status: AC
  Administered 2014-11-29: 10 mg via RECTAL
  Filled 2014-11-29: qty 1

## 2014-11-29 MED ORDER — POTASSIUM CHLORIDE IN NACL 40-0.9 MEQ/L-% IV SOLN
INTRAVENOUS | Status: DC
  Filled 2014-11-29: qty 1000

## 2014-11-29 MED ORDER — FAT EMULSION 20 % IV EMUL
240.0000 mL | INTRAVENOUS | Status: AC
  Administered 2014-11-29: 240 mL via INTRAVENOUS
  Filled 2014-11-29: qty 250

## 2014-11-29 NOTE — Progress Notes (Signed)
5 Days Post-Op  Subjective: Patient reports more "rumbling" in her abdomen, but no flatus. Pain seems to be well-controlled 600 NG output On TNA  Objective: Vital signs in last 24 hours: Temp:  [99.1 F (37.3 C)-101 F (38.3 C)] 99.1 F (37.3 C) (07/18 0600) Pulse Rate:  [79-83] 81 (07/18 0600) Resp:  [13-22] 16 (07/18 0600) BP: (127-142)/(60-72) 127/62 mmHg (07/18 0600) SpO2:  [98 %-100 %] 100 % (07/18 0600) Last BM Date: 11/24/14  Intake/Output from previous day: 07/17 0701 - 07/18 0700 In: 2490.3 [P.O.:30; I.V.:620.5; IV Piggyback:100; TPN:1739.8] Out: 2250 [Urine:1650; Emesis/NG output:600] Intake/Output this shift:    General appearance: alert, cooperative and no distress Resp: clear to auscultation bilaterally Cardio: regular rate and rhythm, S1, S2 normal, no murmur, click, rub or gallop GI: softer, less distended; occasional bowel sounds; less tender Wound VAC with good seal  Lab Results:   Recent Labs  11/28/14 0340 11/29/14 0350  WBC 11.6* 10.2  HGB 9.2* 9.3*  HCT 27.4* 27.4*  PLT 208 221   BMET  Recent Labs  11/28/14 0340 11/29/14 0350  NA 132* 133*  K 3.3* 3.2*  CL 97* 99*  CO2 29 27  GLUCOSE 144* 141*  BUN 10 11  CREATININE 0.58 0.59  CALCIUM 8.1* 8.1*   PT/INR No results for input(s): LABPROT, INR in the last 72 hours. ABG No results for input(s): PHART, HCO3 in the last 72 hours.  Invalid input(s): PCO2, PO2  Studies/Results: No results found.  Anti-infectives: Anti-infectives    Start     Dose/Rate Route Frequency Ordered Stop   11/26/14 2200  piperacillin-tazobactam (ZOSYN) IVPB 3.375 g     3.375 g 12.5 mL/hr over 240 Minutes Intravenous 3 times per day 11/26/14 1500     11/26/14 1500  piperacillin-tazobactam (ZOSYN) IVPB 3.375 g     3.375 g 100 mL/hr over 30 Minutes Intravenous NOW 11/26/14 1459 11/26/14 1714   11/24/14 2200  cefOXitin (MEFOXIN) 1 g in dextrose 5 % 50 mL IVPB     1 g 100 mL/hr over 30 Minutes Intravenous  Every 6 hours 11/24/14 1601 11/25/14 1127   11/24/14 0822  cefOXitin (MEFOXIN) 2 g in dextrose 5 % 50 mL IVPB     2 g 100 mL/hr over 30 Minutes Intravenous On call to O.R. 11/24/14 0822 11/24/14 1425      Assessment/Plan: s/p Procedure(s): EXPLORATORY LAPAROTOMY EXTENSIVE LYSIS OF ADHESIONS SAMLL BOWEL RESECTION RIGHT HEMI COLECTOMY WOUND VAC APPLICATION.  (N/A) SMALL BOWEL RESECTION (N/A) LYSIS OF ADHESION (3 HRS) RIGHT HEMI COLECTOMY APPLICATION OF WOUND VAC Fever/ WBC probably from pulmonary - now normalized on Zosyn  Post-operative ileus - beginning to have some bowel sounds Will try one Dulcolax Mobility limited by multiple tubes/ VAC - OOB to chair VAC change MWF    LOS: 5 days    Othelia Riederer K. 11/29/2014

## 2014-11-29 NOTE — Progress Notes (Signed)
Wound vac changed.  One black foam removed and replaced.  Wound measured 13x4x3.5.  Patient medicated with PCA tolerated fair with moderate discomfort.

## 2014-11-29 NOTE — Progress Notes (Addendum)
PARENTERAL NUTRITION CONSULT NOTE - Follow up  Pharmacy Consult for TPN Indication: prolonged post-operative ileus  Allergies  Allergen Reactions  . Lisinopril     cough  . Crestor [Rosuvastatin Calcium]     Muscle and joint aches  . Lipitor [Atorvastatin Calcium]     Muscle cramps  . Azathioprine Other (See Comments)    Side effects to pancrease       Patient Measurements: Height: 5\' 3"  (160 cm) Weight: 205 lb 9 oz (93.243 kg) IBW/kg (Calculated) : 52.4 Adjusted Body Weight: 65 kg   Vital Signs: Temp: 99.1 F (37.3 C) (07/18 0600) Temp Source: Oral (07/18 0600) BP: 127/62 mmHg (07/18 0600) Pulse Rate: 81 (07/18 0600) Intake/Output from previous day: 07/17 0701 - 07/18 0700 In: 2490.3 [P.O.:30; I.V.:620.5; IV Piggyback:100; TPN:1739.8] Out: 2250 [Urine:1650; Emesis/NG output:600] Intake/Output from this shift:    Labs:  Recent Labs  11/27/14 0455 11/28/14 0340 11/29/14 0350  WBC 16.8* 11.6* 10.2  HGB 9.3* 9.2* 9.3*  HCT 28.0* 27.4* 27.4*  PLT 201 208 221     Recent Labs  11/27/14 0455 11/28/14 0340 11/29/14 0350  NA 135 132* 133*  K 3.0* 3.3* 3.2*  CL 97* 97* 99*  CO2 30 29 27   GLUCOSE 126* 144* 141*  BUN 10 10 11   CREATININE 0.70 0.58 0.59  CALCIUM 8.2* 8.1* 8.1*  MG 2.0 1.8 1.8  PHOS 3.1 3.4 3.9  PROT 5.9*  --  6.0*  ALBUMIN 2.5*  --  2.5*  AST 15  --  17  ALT 10*  --  11*  ALKPHOS 42  --  53  BILITOT 0.3  --  0.2*  PREALBUMIN 5.6*  --   --   TRIG 171*  --   --   Corrected calcium 9.5 Estimated Creatinine Clearance: 82.1 mL/min (by C-G formula based on Cr of 0.59).    Recent Labs  11/28/14 1643 11/29/14 11/29/14 0556  GLUCAP 108* 129* 138*    Medical History: Past Medical History  Diagnosis Date  . Allergy   . Depression   . Hypertension   . Crohn disease   . Sleep apnea     does not use cpap since weight loss 1 and 1/2 years ago  . Diabetes mellitus type II     borderline, md checks hemaglobin A 1 c at md office     Insulin Requirements: 2 units in past 24 hours  Current Nutrition:  NPO Clinimix E 5/15 at 70 mL/hr + Fat Emulsion 20% at 10 mL/hr  IVF:  NS + KCl 12mEq/L at 88mL/hr  Central access: PICC placed 7/15  TPN start date: 7/15   ASSESSMENT                                                                                                          HPI: 59 y/o F with long history of Crohn's disease and s/p bowel resection ~ 30 years ago presented with persistent phlegmonous mass in abdomen/pelvis, clumped distal small bowel loops, and abscesses.  On 7/13 underwent extensive lysis  of adhesions, SB resection, R hemicolectomy, placement of abd wound vac.  On 7/15 had emesis after attempting intermittent NG clamping. Given extent of surgery anticipate post-operative ileus will be prolonged. Orders received to begin TPN with pharmacy dosing assistance.  Significant events:  7/13: surgery as outlined above 7/15: for PICC placement and TPN start 7/17: TPN advanced to goal rate  Today:   Glucose - at goal <150 with minimal SSI  Electrolytes - Mg 1.8, Phos and CorrCa wnl. K 3.2, remains low despite 5 runs yest and currently  ~36meq/day via IVF.    Renal - SCr and BUN WNL  LFTs - all below ULN  TGs - 171  Prealbumin - 5.6  NUTRITIONAL GOALS                                                                                             RD recs: 70-80g protein/day, 1600-1800 KCal/day Clinimix E 5/15 at 70 mL/hr + fat Emulsion 20% at 10 mL/hr would deliver 84 grams protein/day, 1672 KCal/day  PLAN                                                                                                                          Cont Clinimix E 5/15 at 70 mL/hr.  Cont 20% fat emulsion at 10 mL/hr.  TPN to contain standard multivitamins and trace elements.  Cont IVF at 20 mL/hr. Do not interrupt to infuse Zosyn.   Give K runs x 4 today. 13meq/50ml via PICC.  Continue CBGs q6h and sensitive SSI   TPN  lab panel on Mon and Thur.  F/u daily.  Romeo Rabon, PharmD, pager [phone]. 11/29/2014,8:12 AM.

## 2014-11-29 NOTE — Progress Notes (Signed)
ANTIBIOTIC CONSULT NOTE - Follow up  Pharmacy Consult for Zosyn Indication: Intra-abdominal infection  Allergies  Allergen Reactions  . Lisinopril     cough  . Crestor [Rosuvastatin Calcium]     Muscle and joint aches  . Lipitor [Atorvastatin Calcium]     Muscle cramps  . Azathioprine Other (See Comments)    Side effects to pancrease       Patient Measurements: Height: 5\' 3"  (160 cm) Weight: 205 lb 9 oz (93.243 kg) IBW/kg (Calculated) : 52.4 Adjusted Body Weight:   Vital Signs: Temp: 99.1 F (37.3 C) (07/18 0600) Temp Source: Oral (07/18 0600) BP: 127/62 mmHg (07/18 0600) Pulse Rate: 81 (07/18 0600) Intake/Output from previous day: 07/17 0701 - 07/18 0700 In: 2490.3 [P.O.:30; I.V.:620.5; IV Piggyback:100; TPN:1739.8] Out: 2250 [Urine:1650; Emesis/NG output:600] Intake/Output from this shift:    Labs:  Recent Labs  11/27/14 0455 11/28/14 0340 11/29/14 0350  WBC 16.8* 11.6* 10.2  HGB 9.3* 9.2* 9.3*  PLT 201 208 221  CREATININE 0.70 0.58 0.59   Estimated Creatinine Clearance: 82.1 mL/min (by C-G formula based on Cr of 0.59). No results for input(s): VANCOTROUGH, VANCOPEAK, VANCORANDOM, GENTTROUGH, GENTPEAK, GENTRANDOM, TOBRATROUGH, TOBRAPEAK, TOBRARND, AMIKACINPEAK, AMIKACINTROU, AMIKACIN in the last 72 hours.   Microbiology: Recent Results (from the past 720 hour(s))  Culture, blood (routine x 2)     Status: None (Preliminary result)   Collection Time: 11/26/14  6:00 PM  Result Value Ref Range Status   Specimen Description BLOOD RIGHT PICC LINE  Final   Special Requests BOTTLES DRAWN AEROBIC AND ANAEROBIC 10CC  Final   Culture   Final    NO GROWTH 2 DAYS Performed at [facility]    Report Status PENDING  Incomplete  Culture, blood (routine x 2)     Status: None (Preliminary result)   Collection Time: 11/26/14  9:00 PM  Result Value Ref Range Status   Specimen Description BLOOD LEFT ANTECUBITAL  Final   Special Requests BOTTLES DRAWN AEROBIC  ONLY 4CC  Final   Culture   Final    NO GROWTH 1 DAY Performed at [facility]    Report Status PENDING  Incomplete  Culture, Urine     Status: None   Collection Time: 11/26/14 10:34 PM  Result Value Ref Range Status   Specimen Description URINE, RANDOM  Final   Special Requests Immunocompromised  Final   Culture   Final    MULTIPLE SPECIES PRESENT, SUGGEST RECOLLECTION IF CLINICALLY INDICATED Performed at [facility]    Report Status 11/28/2014 FINAL  Final   Assessment: 52 yoF with hx Crohn's disease s/p bowel resection 30 years ago presents with inflamed bowel with 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  Pt underwent exploratory laparotomy, LOA, small bowel resection, right hemicolectomy, and wound vac placement on 7/13.  TNA started 7/15 for ileus.  Pharmacy now consulted to start zosyn 7/15 for fevers and possible intra-abdominal infection.    7/15 >> Zosyn >>  7/15 blood x2: ngtd 7/15 urine: mult species, suggested recollection  Today, 11/29/2014: Tm 100.6, WBC improved to wnl, SCr remains wnl.  Goal of Therapy:  Appropriate antibiotic dosing for renal function; eradication of infection  Plan:  Cont Zosyn 3.375g IV q8h (infuse over 4 hours) F/u cultures, renal function, clinical course  Romeo Rabon, PharmD, pager [phone]. 11/29/2014,8:02 AM.

## 2014-11-29 NOTE — Progress Notes (Signed)
Nutrition Follow-up  DOCUMENTATION CODES:   Obesity unspecified  INTERVENTION:   TPN per Pharmacy RD to continue to monitor  NUTRITION DIAGNOSIS:   Inadequate oral intake related to inability to eat as evidenced by NPO status.  Ongoing.  GOAL:   Patient will meet greater than or equal to 90% of their needs  Meeting with TPN.  MONITOR:   Diet advancement, Labs, Weight trends, Skin, I & O's, Other (Comment) (TPN)  ASSESSMENT:   59 year old female with a very long history of Crohn's disease who is status post bowel resection about 30 years ago.   TPN at goal.Clinimix E 5/15 at 70 mL/hr + fat Emulsion 20% at 10 mL/hr would deliver 84 grams protein/day, 1672 KCal/day. Pt tolerating. NGT output: 100 ml  Plan per Pharmacy:  Cont Clinimix E 5/15 at 70 mL/hr.  Cont 20% fat emulsion at 10 mL/hr.  Labs reviewed: CBGs: 108-138 Low Na, K ->still continues to be low  Mg/Phos WNL  Diet Order:  Diet NPO time specified Except for: Ice Chips, Sips with Meds TPN (CLINIMIX-E) Adult TPN (CLINIMIX-E) Adult  Skin:  Wound (see comment) (surgical incision)  Last BM:  7/13  Height:   Ht Readings from Last 1 Encounters:  11/24/14 5\' 3"  (1.6 m)    Weight:   Wt Readings from Last 1 Encounters:  11/24/14 205 lb 9 oz (93.243 kg)    Ideal Body Weight:  52.3 kg  Wt Readings from Last 10 Encounters:  11/24/14 205 lb 9 oz (93.243 kg)  11/19/14 205 lb 9.6 oz (93.26 kg)  06/09/14 220 lb (99.791 kg)  05/24/14 227 lb 11.8 oz (103.3 kg)  04/06/14 212 lb 1.3 oz (96.2 kg)  03/11/14 219 lb (99.338 kg)  01/12/14 217 lb (98.431 kg)  09/14/13 220 lb (99.791 kg)  03/28/13 216 lb 3 oz (98.062 kg)  03/04/13 215 lb (97.523 kg)    BMI:  Body mass index is 36.42 kg/(m^2).  Estimated Nutritional Needs:   Kcal:  1600-1800  Protein:  70-80g  Fluid:  1.7L/day  EDUCATION NEEDS:   No education needs identified at this time  Clayton Bibles, MS, RD, LDN Pager: [phone] After Hours  Pager: [phone]

## 2014-11-30 LAB — BASIC METABOLIC PANEL
Anion gap: 7 (ref 5–15)
BUN: 12 mg/dL (ref 6–20)
CO2: 27 mmol/L (ref 22–32)
CREATININE: 0.66 mg/dL (ref 0.44–1.00)
Calcium: 8.5 mg/dL — ABNORMAL LOW (ref 8.9–10.3)
Chloride: 98 mmol/L — ABNORMAL LOW (ref 101–111)
GFR calc Af Amer: >60 mL/min (ref >60)
GLUCOSE: 138 mg/dL — AB (ref 65–99)
Potassium: 3.3 mmol/L — ABNORMAL LOW (ref 3.5–5.1)
Sodium: 132 mmol/L — ABNORMAL LOW (ref 135–145)

## 2014-11-30 LAB — GLUCOSE, CAPILLARY
GLUCOSE-CAPILLARY: 111 mg/dL — AB (ref 65–99)
GLUCOSE-CAPILLARY: 116 mg/dL — AB (ref 65–99)
GLUCOSE-CAPILLARY: 122 mg/dL — AB (ref 65–99)
GLUCOSE-CAPILLARY: 130 mg/dL — AB (ref 65–99)
Glucose-Capillary: 126 mg/dL — ABNORMAL HIGH (ref 65–99)
Glucose-Capillary: 118 mg/dL — ABNORMAL HIGH (ref 65–99)

## 2014-11-30 MED ORDER — TRACE MINERALS CR-CU-MN-SE-ZN 10-1000-500-60 MCG/ML IV SOLN
INTRAVENOUS | Status: AC
  Administered 2014-11-30: 18:00:00 via INTRAVENOUS
  Filled 2014-11-30: qty 1680

## 2014-11-30 MED ORDER — FAT EMULSION 20 % IV EMUL
240.0000 mL | INTRAVENOUS | Status: AC
  Administered 2014-11-30: 240 mL via INTRAVENOUS
  Filled 2014-11-30: qty 250

## 2014-11-30 MED ORDER — BISACODYL 10 MG RE SUPP
10.0000 mg | Freq: Once | RECTAL | Status: AC
  Administered 2014-11-30: 10 mg via RECTAL
  Filled 2014-11-30: qty 1

## 2014-11-30 MED ORDER — POTASSIUM CHLORIDE 10 MEQ/50ML IV SOLN
10.0000 meq | INTRAVENOUS | Status: AC
  Administered 2014-11-30 (×4): 10 meq via INTRAVENOUS
  Filled 2014-11-30 (×4): qty 50

## 2014-11-30 NOTE — Progress Notes (Signed)
6 Days Post-Op  Subjective: Patient had a very small bowel movement with suppository Passed a little bit of flatus Tolerated VAC change  Objective: Vital signs in last 24 hours: Temp:  [98.7 F (37.1 C)-100.4 F (38 C)] 99.2 F (37.3 C) (07/19 0609) Pulse Rate:  [76-83] 79 (07/19 0609) Resp:  [16-24] 22 (07/19 0743) BP: (119-134)/(48-73) 119/61 mmHg (07/19 0609) SpO2:  [98 %-100 %] 100 % (07/19 0743) Last BM Date: 11/24/14  Intake/Output from previous day: 07/18 0701 - 07/19 0700 In: 2854.7 [P.O.:60; I.V.:480; IV Piggyback:400; TPN:1914.7] Out: 3500 [Urine:3125; Emesis/NG output:325; Drains:50] Intake/Output this shift: Total I/O In: -  Out: 150 [Urine:150]  General appearance: alert, cooperative and no distress Resp: bibasilar rhonchi Cardio: regular rate and rhythm, S1, S2 normal, no murmur, click, rub or gallop GI: less distended; active bowel sounds; incisional tenderness VAC with good seal  Lab Results:   Recent Labs  11/28/14 0340 11/29/14 0350  WBC 11.6* 10.2  HGB 9.2* 9.3*  HCT 27.4* 27.4*  PLT 208 221   BMET  Recent Labs  11/29/14 0350 11/30/14 0430  NA 133* 132*  K 3.2* 3.3*  CL 99* 98*  CO2 27 27  GLUCOSE 141* 138*  BUN 11 12  CREATININE 0.59 0.66  CALCIUM 8.1* 8.5*   PT/INR No results for input(s): LABPROT, INR in the last 72 hours. ABG No results for input(s): PHART, HCO3 in the last 72 hours.  Invalid input(s): PCO2, PO2  Studies/Results: No results found.  Anti-infectives: Anti-infectives    Start     Dose/Rate Route Frequency Ordered Stop   11/26/14 2200  piperacillin-tazobactam (ZOSYN) IVPB 3.375 g     3.375 g 12.5 mL/hr over 240 Minutes Intravenous 3 times per day 11/26/14 1500     11/26/14 1500  piperacillin-tazobactam (ZOSYN) IVPB 3.375 g     3.375 g 100 mL/hr over 30 Minutes Intravenous NOW 11/26/14 1459 11/26/14 1714   11/24/14 2200  cefOXitin (MEFOXIN) 1 g in dextrose 5 % 50 mL IVPB     1 g 100 mL/hr over 30  Minutes Intravenous Every 6 hours 11/24/14 1601 11/25/14 1127   11/24/14 0822  cefOXitin (MEFOXIN) 2 g in dextrose 5 % 50 mL IVPB     2 g 100 mL/hr over 30 Minutes Intravenous On call to O.R. 11/24/14 0822 11/24/14 1425      Assessment/Plan: s/p Procedure(s): EXPLORATORY LAPAROTOMY EXTENSIVE LYSIS OF ADHESIONS SAMLL BOWEL RESECTION RIGHT HEMI COLECTOMY WOUND VAC APPLICATION.  (N/A) SMALL BOWEL RESECTION (N/A) LYSIS OF ADHESION (3 HRS) RIGHT HEMI COLECTOMY APPLICATION OF WOUND VAC Clamp NG tube  Repeat Dulcolax Continue TNA Encourage ambulation/ IS  LOS: 6 days    Luken Shadowens K. 11/30/2014

## 2014-11-30 NOTE — Progress Notes (Signed)
Dr. Zella Richer notified of lipids IV line coming apart from TNA line.  No orders received.

## 2014-11-30 NOTE — Progress Notes (Signed)
PARENTERAL NUTRITION CONSULT NOTE - Follow up  Pharmacy Consult for TPN Indication: prolonged post-operative ileus  Allergies  Allergen Reactions  . Lisinopril     cough  . Crestor [Rosuvastatin Calcium]     Muscle and joint aches  . Lipitor [Atorvastatin Calcium]     Muscle cramps  . Azathioprine Other (See Comments)    Side effects to pancrease       Patient Measurements: Height: 5\' 3"  (160 cm) Weight: 205 lb 9 oz (93.243 kg) IBW/kg (Calculated) : 52.4 Adjusted Body Weight: 65 kg   Vital Signs: Temp: 98.2 F (36.8 C) (07/19 0916) Temp Source: Oral (07/19 0916) BP: 109/53 mmHg (07/19 0916) Pulse Rate: 75 (07/19 0916) Intake/Output from previous day: 07/18 0701 - 07/19 0700 In: 2854.7 [P.O.:60; I.V.:480; IV Piggyback:400; TPN:1914.7] Out: 3500 [Urine:3125; Emesis/NG output:325; Drains:50] Intake/Output from this shift: Total I/O In: -  Out: 150 [Urine:150]  Labs:  Recent Labs  11/28/14 0340 11/29/14 0350  WBC 11.6* 10.2  HGB 9.2* 9.3*  HCT 27.4* 27.4*  PLT 208 221     Recent Labs  11/28/14 0340 11/29/14 0350 11/30/14 0430  NA 132* 133* 132*  K 3.3* 3.2* 3.3*  CL 97* 99* 98*  CO2 29 27 27   GLUCOSE 144* 141* 138*  BUN 10 11 12   CREATININE 0.58 0.59 0.66  CALCIUM 8.1* 8.1* 8.5*  MG 1.8 1.8  --   PHOS 3.4 3.9  --   PROT  --  6.0*  --   ALBUMIN  --  2.5*  --   AST  --  17  --   ALT  --  11*  --   ALKPHOS  --  53  --   BILITOT  --  0.2*  --   PREALBUMIN  --  6.3*  --   TRIG  --  226*  --   Corrected calcium 9.7 Estimated Creatinine Clearance: 82.1 mL/min (by C-G formula based on Cr of 0.66).    Recent Labs  11/29/14 1715 11/29/14 2349 11/30/14 0604  GLUCAP 126* 130* 122*    Medical History: Past Medical History  Diagnosis Date  . Allergy   . Depression   . Hypertension   . Crohn disease   . Sleep apnea     does not use cpap since weight loss 1 and 1/2 years ago  . Diabetes mellitus type II     borderline, md checks hemaglobin  A 1 c at md office    Insulin Requirements: 3 units in past 24 hours  Current Nutrition:  NPO Clinimix E 5/15 at 70 mL/hr + Fat Emulsion 20% at 10 mL/hr  IVF:  NS + KCl 6mEq/L at 63mL/hr  Central access: PICC placed 7/15  TPN start date: 7/15   ASSESSMENT                                                                                                          HPI: 59 y/o F with long history of Crohn's disease and s/p bowel resection ~ 30 years ago presented with  persistent phlegmonous mass in abdomen/pelvis, clumped distal small bowel loops, and abscesses.  On 7/13 underwent extensive lysis of adhesions, SB resection, R hemicolectomy, placement of abd wound vac.  On 7/15 had emesis after attempting intermittent NG clamping. Given extent of surgery anticipate post-operative ileus will be prolonged. Orders received to begin TPN with pharmacy dosing assistance.  Significant events:  7/13: surgery as outlined above 7/15: for PICC placement and TPN start 7/17: TPN advanced to goal rate 7/19: per [facility] notes, very small BM and passing a little flatus, to clamp NG tube today. Note that IV lipids were found not hooked up by RN this AM and have since been taken down. Will not restart IV lipids till 6pm with new TPN bag  Today:   Glucose - at goal <150 with minimal SSI  Electrolytes - Na low but stable. K 3.3, remains low despite IV replacement orders daily and  ~62meq/day via IVF.    Renal - SCr and BUN WNL  LFTs - all below ULN  TGs - 171, 226 - up slightly will continue to monitor  Prealbumin - 5.6, 6.3  NUTRITIONAL GOALS                                                                                             RD recs: 70-80g protein/day, 1600-1800 KCal/day Clinimix E 5/15 at 70 mL/hr + fat Emulsion 20% at 10 mL/hr would deliver 84 grams protein/day, 1672 KCal/day  PLAN                                                                                                                           Cont Clinimix E 5/15 at 70 mL/hr.  Restart 20% fat emulsion at 10 mL/hr at 1800  TPN to contain standard multivitamins and trace elements.  Cont IVF at 20 mL/hr. Do not interrupt to infuse Zosyn.   Repeat K runs x 4 again today. 75meq/50ml via PICC.  Continue CBGs q6h and sensitive SSI   TPN lab panel on Mon and Thur.  F/u daily.   Adrian Saran, PharmD, BCPS Pager [phone] 11/30/2014 9:32 AM

## 2014-12-01 LAB — BASIC METABOLIC PANEL
Anion gap: 5 (ref 5–15)
BUN: 11 mg/dL (ref 6–20)
CO2: 28 mmol/L (ref 22–32)
Calcium: 8.6 mg/dL — ABNORMAL LOW (ref 8.9–10.3)
Chloride: 100 mmol/L — ABNORMAL LOW (ref 101–111)
Creatinine, Ser: 0.61 mg/dL (ref 0.44–1.00)
GFR calc Af Amer: >60 mL/min (ref >60)
GFR calc non Af Amer: >60 mL/min (ref >60)
GLUCOSE: 130 mg/dL — AB (ref 65–99)
POTASSIUM: 3.6 mmol/L (ref 3.5–5.1)
Sodium: 133 mmol/L — ABNORMAL LOW (ref 135–145)

## 2014-12-01 LAB — PHOSPHORUS: Phosphorus: 3.9 mg/dL (ref 2.5–4.6)

## 2014-12-01 LAB — GLUCOSE, CAPILLARY
GLUCOSE-CAPILLARY: 122 mg/dL — AB (ref 65–99)
GLUCOSE-CAPILLARY: 133 mg/dL — AB (ref 65–99)
Glucose-Capillary: 115 mg/dL — ABNORMAL HIGH (ref 65–99)
Glucose-Capillary: 112 mg/dL — ABNORMAL HIGH (ref 65–99)

## 2014-12-01 LAB — CULTURE, BLOOD (ROUTINE X 2): Culture: NO GROWTH

## 2014-12-01 LAB — MAGNESIUM: MAGNESIUM: 1.9 mg/dL (ref 1.7–2.4)

## 2014-12-01 MED ORDER — FAT EMULSION 20 % IV EMUL
240.0000 mL | INTRAVENOUS | Status: AC
Start: 2014-12-01 — End: 2014-12-02
  Administered 2014-12-01: 240 mL via INTRAVENOUS
  Filled 2014-12-01: qty 250

## 2014-12-01 MED ORDER — M.V.I. ADULT IV INJ
INJECTION | INTRAVENOUS | Status: AC
  Administered 2014-12-01: 18:00:00 via INTRAVENOUS
  Filled 2014-12-01: qty 1680

## 2014-12-01 NOTE — Progress Notes (Signed)
7 Days Post-Op  Subjective: Patient had a good bowel movement yesterday, passing flatus Pain is better controlled No nausea - NG remained clamped all day   Objective: Vital signs in last 24 hours: Temp:  [98.2 F (36.8 C)-99.8 F (37.7 C)] 99.2 F (37.3 C) (07/20 0546) Pulse Rate:  [74-85] 80 (07/20 0546) Resp:  [14-22] 20 (07/20 0546) BP: (109-130)/(53-66) 130/57 mmHg (07/20 0546) SpO2:  [97 %-100 %] 100 % (07/20 0546) Last BM Date: 11/30/14  Intake/Output from previous day: 07/19 0701 - 07/20 0700 In: 900 [P.O.:60; IV Piggyback:200; TPN:640] Out: 3150 [Urine:3150] Intake/Output this shift: Total I/O In: 0  Out: 1800 [Urine:1800]  General appearance: alert, cooperative and no distress Resp: clear to auscultation bilaterally Cardio: regular rate and rhythm, S1, S2 normal, no murmur, click, rub or gallop GI: soft, moderate incisional tenderness VAC with good seal  Lab Results:   Recent Labs  11/29/14 0350  WBC 10.2  HGB 9.3*  HCT 27.4*  PLT 221   BMET  Recent Labs  11/30/14 0430 12/01/14 0330  NA 132* 133*  K 3.3* 3.6  CL 98* 100*  CO2 27 28  GLUCOSE 138* 130*  BUN 12 11  CREATININE 0.66 0.61  CALCIUM 8.5* 8.6*   PT/INR No results for input(s): LABPROT, INR in the last 72 hours. ABG No results for input(s): PHART, HCO3 in the last 72 hours.  Invalid input(s): PCO2, PO2  Studies/Results: No results found.  Anti-infectives: Anti-infectives    Start     Dose/Rate Route Frequency Ordered Stop   11/26/14 2200  piperacillin-tazobactam (ZOSYN) IVPB 3.375 g     3.375 g 12.5 mL/hr over 240 Minutes Intravenous 3 times per day 11/26/14 1500     11/26/14 1500  piperacillin-tazobactam (ZOSYN) IVPB 3.375 g     3.375 g 100 mL/hr over 30 Minutes Intravenous NOW 11/26/14 1459 11/26/14 1714   11/24/14 2200  cefOXitin (MEFOXIN) 1 g in dextrose 5 % 50 mL IVPB     1 g 100 mL/hr over 30 Minutes Intravenous Every 6 hours 11/24/14 1601 11/25/14 1127   11/24/14  0822  cefOXitin (MEFOXIN) 2 g in dextrose 5 % 50 mL IVPB     2 g 100 mL/hr over 30 Minutes Intravenous On call to O.R. 11/24/14 0822 11/24/14 1425      Assessment/Plan: s/p Procedure(s): EXPLORATORY LAPAROTOMY EXTENSIVE LYSIS OF ADHESIONS SAMLL BOWEL RESECTION RIGHT HEMI COLECTOMY WOUND VAC APPLICATION.  (N/A) SMALL BOWEL RESECTION (N/A) LYSIS OF ADHESION (3 HRS) RIGHT HEMI COLECTOMY APPLICATION OF WOUND VAC remove NG tube  Clear liquids If patient tolerates clears today, will begin weaning TNA tomorrow, D/C PCA and begin PO pain meds tomorrow VAC change today - will probably d/c VAC on Friday Will probably switch to wet to dry dressings at discharge - wound is clean Will need home health nursing for daily wet to dry dressing changes Possible discharge Friday or Saturday   LOS: 7 days    April Gambone K. 12/01/2014

## 2014-12-01 NOTE — Care Management Important Message (Signed)
Important Message  Patient Details  Name: April May MRN: [id number] Date of Birth: 1955-11-09   Medicare Important Message Given:  Yes-third notification given    Camillo Flaming 12/01/2014, 12:28 [place] Message  Patient Details  Name: April May MRN: [id number] Date of Birth: [birth year]   Medicare Important Message Given:  Yes-third notification given    Camillo Flaming 12/01/2014, 12:27 PM

## 2014-12-01 NOTE — Care Management Note (Signed)
Case Management Note  Patient Details  Name: April May MRN: [id number] Date of Birth: Apr 05, 1956  Subjective/Objective:                   Crohns disease; small bowel/ right colon phlegmon with intra-abdominal abscess and possible fistulae Action/Plan:  Discharge planning Expected Discharge Date:  12/03/14               Expected Discharge Plan:  [place]  In-House Referral:     Discharge planning Services  CM Consult  Post Acute Care Choice:  Home Health Choice offered to:  Patient  DME Arranged:    DME Agency:     HH Arranged:  RN [place] Agency:  [place]  Status of Service:  Completed, signed off  Medicare Important Message Given:  Yes-third notification given Date Medicare IM Given:    Medicare IM give by:    Date Additional Medicare IM Given:    Additional Medicare Important Message give by:     If discussed at [place] of Stay Meetings, dates discussed:    Additional Comments: CM met with pt in room to offer choice of home health agency.  Pt chooses [facility] to render [facility] for daily wet to dry dressing changes.  Address and contact information verified by pt.  Referral called to [facility] rep, Kristen. No other CM needs were communicated. Dellie Catholic, RN 12/01/2014, 1:25 PM

## 2014-12-01 NOTE — Evaluation (Signed)
Physical Therapy Evaluation Patient Details Name: April May MRN: [id number] DOB: 08/23/1955 Today's Date: 12/01/2014   History of Present Illness  59 yo female s/p exp lap SB resection, R hemicolectomy, lysis of adhesions 11/24/14. Hx of Crohn's disease.  Clinical Impression  On eval, pt required Min assist for mobility-walked ~500 feet (held onto Iv pole for ~250 feet, then no external support for remaining ~250 feet). Pt tolerated activity well. Abdominal pain ~5/10 with activity. Recommend HHPT depending on progress.     Follow Up Recommendations Home health PT    Equipment Recommendations  None recommended by PT    Recommendations for Other Services       Precautions / Restrictions Precautions Precautions: Fall Precaution Comments: abdominal surgery, multiple lines/leads Restrictions Weight Bearing Restrictions: No      Mobility  Bed Mobility Overal bed mobility: Needs Assistance Bed Mobility: Supine to Sit     Supine to sit: Min assist;HOB elevated     General bed mobility comments: assist for trunk to upright. Increased time  Transfers Overall transfer level: Needs assistance   Transfers: Sit to/from Stand Sit to Stand: Min guard         General transfer comment: close guard for safety  Ambulation/Gait Ambulation/Gait assistance: Min guard Ambulation Distance (Feet): 500 Feet Assistive device:  (IV pole; None) Gait Pattern/deviations: Step-through pattern;Decreased stride length     General Gait Details: close guard for safety. unsteady but no LOB  Financial trader [place] (Stroke Patients Only)       Balance                                             Pertinent Vitals/Pain Pain Assessment: 0-10 Pain Score: 5  Pain Location: abdomen Pain Descriptors / Indicators: Sore Pain Intervention(s): Monitored during session;PCA encouraged;Repositioned    Home Living  Family/patient expects to be discharged to:: Private residence Living Arrangements: Children Available Help at Discharge: Family Type of Home: House Home Access: Level entry     Home Layout: One level Home Equipment: None      Prior Function Level of Independence: Independent               Hand Dominance        Extremity/Trunk Assessment   Upper Extremity Assessment: Generalized weakness           Lower Extremity Assessment: Generalized weakness      Cervical / Trunk Assessment: Normal  Communication   Communication: No difficulties  Cognition Arousal/Alertness: Awake/alert Behavior During Therapy: WFL for tasks assessed/performed Overall Cognitive Status: Within Functional Limits for tasks assessed                      General Comments      Exercises        Assessment/Plan    PT Assessment Patient needs continued PT services  PT Diagnosis Difficulty walking;Generalized weakness;Acute pain   PT Problem List Decreased strength;Decreased activity tolerance;Decreased balance;Decreased mobility;Obesity;Pain  PT Treatment Interventions DME instruction;Gait training;Functional mobility training;Therapeutic activities;Patient/family education;Balance training   PT Goals (Current goals can be found in the Care Plan section) Acute Rehab PT Goals Patient Stated Goal: home soon PT Goal Formulation: With patient Time For Goal Achievement: 12/15/14 Potential to Achieve Goals: Good  Frequency Min 3X/week   Barriers to discharge        Co-evaluation               End of Session   Activity Tolerance: Patient tolerated treatment well Patient left: in chair;with call bell/phone within reach;with family/visitor present           Time: 4497-5300 PT Time Calculation (min) (ACUTE ONLY): 14 min   Charges:   PT Evaluation $Initial PT Evaluation Tier I: 1 Procedure     PT G Codes:        Weston Anna, MPT Pager: [phone]

## 2014-12-01 NOTE — Telephone Encounter (Signed)
Patient called to advise that she has not received a mammogram, but she is currently admitted to the hospital. She states that she will be a while and is not sure when she will be discharged. She does not have an ETA for scheduling one at this point.

## 2014-12-01 NOTE — Progress Notes (Signed)
PARENTERAL NUTRITION CONSULT NOTE - Follow up  Pharmacy Consult for TPN Indication: prolonged post-operative ileus  Allergies  Allergen Reactions  . Lisinopril     cough  . Crestor [Rosuvastatin Calcium]     Muscle and joint aches  . Lipitor [Atorvastatin Calcium]     Muscle cramps  . Azathioprine Other (See Comments)    Side effects to pancrease       Patient Measurements: Height: 5' 3"  (160 cm) Weight: 205 lb 9 oz (93.243 kg) IBW/kg (Calculated) : 52.4 Adjusted Body Weight: 65 kg   Vital Signs: Temp: 99.2 F (37.3 C) (07/20 0546) Temp Source: Oral (07/20 0546) BP: 130/57 mmHg (07/20 0546) Pulse Rate: 80 (07/20 0546) Intake/Output from previous day: 07/19 0701 - 07/20 0700 In: 900 [P.O.:60; IV Piggyback:200; TPN:640] Out: 3400 [Urine:3400] Intake/Output from this shift:    Labs:  Recent Labs  11/29/14 0350  WBC 10.2  HGB 9.3*  HCT 27.4*  PLT 221     Recent Labs  11/29/14 0350 11/30/14 0430 12/01/14 0330  NA 133* 132* 133*  K 3.2* 3.3* 3.6  CL 99* 98* 100*  CO2 27 27 28   GLUCOSE 141* 138* 130*  BUN 11 12 11   CREATININE 0.59 0.66 0.61  CALCIUM 8.1* 8.5* 8.6*  MG 1.8  --  1.9  PHOS 3.9  --  3.9  PROT 6.0*  --   --   ALBUMIN 2.5*  --   --   AST 17  --   --   ALT 11*  --   --   ALKPHOS 53  --   --   BILITOT 0.2*  --   --   PREALBUMIN 6.3*  --   --   TRIG 226*  --   --   Corrected calcium 9.7 Estimated Creatinine Clearance: 82.1 mL/min (by C-G formula based on Cr of 0.61).    Recent Labs  11/30/14 1820 11/30/14 2342 12/01/14 0615  GLUCAP 111* 116* 133*    Medical History: Past Medical History  Diagnosis Date  . Allergy   . Depression   . Hypertension   . Crohn disease   . Sleep apnea     does not use cpap since weight loss 1 and 1/2 years ago  . Diabetes mellitus type II     borderline, md checks hemaglobin A 1 c at md office    Insulin Requirements: 1 units in past 24 hours  Current Nutrition:  Clear liquids Clinimix E  5/15 at 70 mL/hr + Fat Emulsion 20% at 10 mL/hr  IVF:  NS + KCl 39mq/L at 284mhr  Central access: PICC placed 7/15  TPN start date: 7/15   ASSESSMENT                                                                                                          HPI: [id number] F with long history of Crohn's disease and s/p bowel resection ~ 30 years ago presented with persistent phlegmonous mass in abdomen/pelvis, clumped distal small bowel loops, and abscesses.  On 7/13  underwent extensive lysis of adhesions, SB resection, R hemicolectomy, placement of abd wound vac.  On 7/15 had emesis after attempting intermittent NG clamping. Given extent of surgery anticipate post-operative ileus will be prolonged. Orders received to begin TPN with pharmacy dosing assistance.  Significant events:  7/13: surgery as outlined above 7/15: for PICC placement and TPN start 7/17: TPN advanced to goal rate 7/19: per [facility] notes, very small BM and passing a little flatus, to clamp NG tube today. Note that IV lipids were found not hooked up by RN this AM and have since been taken down. Will not restart IV lipids till 6pm with new TPN bag 7/20: Starting clears, per [facility] notes possibly start weaning TPN 7/21 if tolerating  Today:   Glucose - at goal <150 with minimal SSI  Electrolytes - Na low but stable. K = 3.6.    Renal - SCr and BUN WNL  LFTs - all below ULN  TGs - 171, 226 - up slightly will continue to monitor  Prealbumin - 5.6, 6.3  NUTRITIONAL GOALS                                                                                             RD recs: 70-80g protein/day, 1600-1800 KCal/day Clinimix E 5/15 at 70 mL/hr + fat Emulsion 20% at 10 mL/hr would deliver 84 grams protein/day, 1672 KCal/day  PLAN                                                                                                                          Continue Clinimix E 5/15 at 70 mL/hr.  Continue 20% fat emulsion at 10 mL/hr at  1800  TPN to contain standard multivitamins and trace elements.  Cont IVF at 20 mL/hr. Do not interrupt to infuse Zosyn.   Continue CBGs q6h and sensitive SSI   TPN lab panel on Mon and Thur.  F/u daily.  Doreene Eland, PharmD, BCPS.   Pager: [phone] 12/01/2014 7:46 AM

## 2014-12-01 NOTE — Progress Notes (Signed)
PT Cancellation Note  Patient Details Name: April May MRN: [id number] DOB: 06/03/1955   Cancelled Treatment:    Reason Eval/Treat Not Completed: Attempted PT eval-pt agreeable to work with PT however pt just received pain meds and she is very drowsy. Pt requested PT check back another time. Will attempt to check back as schedule permits. Thanks.   Weston Anna, MPT Pager: [phone]

## 2014-12-01 NOTE — Telephone Encounter (Signed)
Noted  

## 2014-12-02 ENCOUNTER — Inpatient Hospital Stay (HOSPITAL_COMMUNITY): Payer: Medicare Other

## 2014-12-02 LAB — COMPREHENSIVE METABOLIC PANEL
ALK PHOS: 96 U/L (ref 38–126)
ALT: 16 U/L (ref 14–54)
ANION GAP: 8 (ref 5–15)
AST: 18 U/L (ref 15–41)
Albumin: 2.7 g/dL — ABNORMAL LOW (ref 3.5–5.0)
BILIRUBIN TOTAL: 0.5 mg/dL (ref 0.3–1.2)
BUN: 10 mg/dL (ref 6–20)
CALCIUM: 8.7 mg/dL — AB (ref 8.9–10.3)
CO2: 27 mmol/L (ref 22–32)
CREATININE: 0.67 mg/dL (ref 0.44–1.00)
Chloride: 97 mmol/L — ABNORMAL LOW (ref 101–111)
GFR calc Af Amer: >60 mL/min (ref >60)
Glucose, Bld: 130 mg/dL — ABNORMAL HIGH (ref 65–99)
Potassium: 3.2 mmol/L — ABNORMAL LOW (ref 3.5–5.1)
Sodium: 132 mmol/L — ABNORMAL LOW (ref 135–145)
TOTAL PROTEIN: 6.9 g/dL (ref 6.5–8.1)

## 2014-12-02 LAB — CBC
HCT: 28.1 % — ABNORMAL LOW (ref 36.0–46.0)
HEMOGLOBIN: 9.1 g/dL — AB (ref 12.0–15.0)
MCH: 29.3 pg (ref 26.0–34.0)
MCHC: 32.4 g/dL (ref 30.0–36.0)
MCV: 90.4 fL (ref 78.0–100.0)
Platelets: 334 10*3/uL (ref 150–400)
RBC: 3.11 MIL/uL — AB (ref 3.87–5.11)
RDW: 14.3 % (ref 11.5–15.5)
WBC: 17.3 10*3/uL — ABNORMAL HIGH (ref 4.0–10.5)

## 2014-12-02 LAB — GLUCOSE, CAPILLARY
GLUCOSE-CAPILLARY: 129 mg/dL — AB (ref 65–99)
GLUCOSE-CAPILLARY: 130 mg/dL — AB (ref 65–99)
Glucose-Capillary: 116 mg/dL — ABNORMAL HIGH (ref 65–99)

## 2014-12-02 LAB — MAGNESIUM: Magnesium: 1.8 mg/dL (ref 1.7–2.4)

## 2014-12-02 LAB — CULTURE, BLOOD (ROUTINE X 2): Culture: NO GROWTH

## 2014-12-02 LAB — PHOSPHORUS: PHOSPHORUS: 4.2 mg/dL (ref 2.5–4.6)

## 2014-12-02 MED ORDER — IOHEXOL 300 MG/ML  SOLN
100.0000 mL | Freq: Once | INTRAMUSCULAR | Status: AC | PRN
  Administered 2014-12-02: 100 mL via INTRAVENOUS

## 2014-12-02 MED ORDER — POTASSIUM CHLORIDE CRYS ER 20 MEQ PO TBCR
40.0000 meq | EXTENDED_RELEASE_TABLET | Freq: Once | ORAL | Status: AC
  Administered 2014-12-02: 40 meq via ORAL
  Filled 2014-12-02: qty 2

## 2014-12-02 MED ORDER — BISACODYL 10 MG RE SUPP: 10.0000 mg | Freq: Every day | RECTAL | Status: DC | PRN

## 2014-12-02 MED ORDER — M.V.I. ADULT IV INJ
INJECTION | INTRAVENOUS | Status: AC
  Administered 2014-12-02: 18:00:00 via INTRAVENOUS
  Filled 2014-12-02: qty 1680

## 2014-12-02 MED ORDER — IOHEXOL 300 MG/ML  SOLN
25.0000 mL | INTRAMUSCULAR | Status: AC
  Administered 2014-12-02 (×2): 25 mL via ORAL

## 2014-12-02 MED ORDER — FAT EMULSION 20 % IV EMUL
240.0000 mL | INTRAVENOUS | Status: DC
  Administered 2014-12-02: 240 mL via INTRAVENOUS
  Filled 2014-12-02: qty 250

## 2014-12-02 NOTE — Care Management Note (Signed)
Case Management Note  Patient Details  Name: April May MRN: [id number] Date of Birth: [birth year]  Subjective/Objective:  [facility] rep Cyril Mourning already following for d/c, HHRN/HHPT orders placed. Await d/c order.                Action/Plan:d/c home w/HHC.   Expected Discharge Date:                  Expected Discharge Plan:  [place]  In-House Referral:     Discharge planning Services  CM Consult  Post Acute Care Choice:  Home Health Choice offered to:  Patient  DME Arranged:    DME Agency:     HH Arranged:  RN, PT [place] Agency:  [place]  Status of Service:  Completed, signed off  Medicare Important Message Given:  Yes-third notification given Date Medicare IM Given:    Medicare IM give by:    Date Additional Medicare IM Given:    Additional Medicare Important Message give by:     If discussed at [place] of Stay Meetings, dates discussed:  12/02/14  Additional Comments:  Dessa Phi, RN 12/02/2014, 10:19 AM

## 2014-12-02 NOTE — Progress Notes (Signed)
PARENTERAL NUTRITION CONSULT NOTE - Follow up  Pharmacy Consult for TPN Indication: prolonged post-operative ileus  Allergies  Allergen Reactions  . Lisinopril     cough  . Crestor [Rosuvastatin Calcium]     Muscle and joint aches  . Lipitor [Atorvastatin Calcium]     Muscle cramps  . Azathioprine Other (See Comments)    Side effects to pancrease       Patient Measurements: Height: 5\' 3"  (160 cm) Weight: 205 lb 9 oz (93.243 kg) IBW/kg (Calculated) : 52.4 Adjusted Body Weight: 65 kg   Vital Signs: Temp: 98.2 F (36.8 C) (07/21 0613) Temp Source: Oral (07/21 3710) BP: 110/57 mmHg (07/21 6269) Pulse Rate: 76 (07/21 0613) Intake/Output from previous day: 07/20 0701 - 07/21 0700 In: 300 [P.O.:300] Out: 2201 [Urine:2201] Intake/Output from this shift:    Labs:  Recent Labs  12/02/14 0025  WBC 17.3*  HGB 9.1*  HCT 28.1*  PLT 334     Recent Labs  11/30/14 0430 12/01/14 0330 12/02/14 0522  NA 132* 133* 132*  K 3.3* 3.6 3.2*  CL 98* 100* 97*  CO2 27 28 27   GLUCOSE 138* 130* 130*  BUN 12 11 10   CREATININE 0.66 0.61 0.67  CALCIUM 8.5* 8.6* 8.7*  MG  --  1.9 1.8  PHOS  --  3.9 4.2  PROT  --   --  6.9  ALBUMIN  --   --  2.7*  AST  --   --  18  ALT  --   --  16  ALKPHOS  --   --  96  BILITOT  --   --  0.5  Corrected calcium 9.7 Estimated Creatinine Clearance: 82.1 mL/min (by C-G formula based on Cr of 0.67).    Recent Labs  12/01/14 1803 12/01/14 2332 12/02/14 0609  GLUCAP 112* 122* 129*    Medical History: Past Medical History  Diagnosis Date  . Allergy   . Depression   . Hypertension   . Crohn disease   . Sleep apnea     does not use cpap since weight loss 1 and 1/2 years ago  . Diabetes mellitus type II     borderline, md checks hemaglobin A 1 c at md office    Insulin Requirements: 3 units in past 24 hours  Current Nutrition:  - Clear liquids - ate 10 to 25% of meals on 7/20 - Clinimix E 5/15 at 70 mL/hr + Fat Emulsion 20% at 10  mL/hr  IVF:  none  Central access: PICC placed 7/15  TPN start date: 7/15   ASSESSMENT                                                                                                          HPI: 59 y/o F with long history of Crohn's disease and s/p bowel resection ~ 30 years ago presented with persistent phlegmonous mass in abdomen/pelvis, clumped distal small bowel loops, and abscesses.  On 7/13 underwent extensive lysis of adhesions, SB resection, R hemicolectomy, placement of abd wound vac.  On 7/15 had emesis after attempting intermittent NG clamping. Given extent of surgery anticipate post-operative ileus will be prolonged. Orders received to begin TPN with pharmacy dosing assistance.  Significant events:  7/13: surgery as outlined above 7/15: for PICC placement and TPN start 7/17: TPN advanced to goal rate 7/19: per [facility] notes, very small BM and passing a little flatus, to clamp NG tube today. Note that IV lipids were found not hooked up by RN this AM and have since been taken down. Will not restart IV lipids till 6pm with new TPN bag 7/20: Starting clears 7/21: per [facility], plan to wean TPN on 7/22 if CT is negative  Today:   Glucose - at goal <150 with minimal SSI  Electrolytes - Na low but stable. K = 3.2.  CoCa wnl  Renal - SCr and BUN WNL  LFTs - wnl  TGs - 171, 226 - up slightly will continue to monitor  Prealbumin - 5.6, 6.3  NUTRITIONAL GOALS                                                                                             RD recs: 70-80g protein/day, 1600-1800 KCal/day Clinimix E 5/15 at 70 mL/hr + fat Emulsion 20% at 10 mL/hr would deliver 84 grams protein/day, 1672 KCal/day  PLAN                                                                                                                          Continue Clinimix E 5/15 at 70 mL/hr.  Continue 20% fat emulsion at 10 mL/hr at 1800  TPN to contain standard multivitamins and trace  elements.  Continue CBGs q6h and sensitive SSI   Potassium chloride 40 mEQ x1 PO today  TPN lab panel on Mon and Thur.  F/u daily.   Dia Sitter, PharmD, BCPS 12/02/2014 8:01 AM

## 2014-12-02 NOTE — Progress Notes (Signed)
PT Cancellation Note  Patient Details Name: April May MRN: [id number] DOB: 1955/11/26   Cancelled Treatment:     pt just got back to bed from [facility] drinking her contrast drinks about to go down stairs for an ABD CT.  Will check back another day as schedule permits.   Nathanial Rancher 12/02/2014, 3:00 PM

## 2014-12-02 NOTE — Care Management Note (Signed)
Case Management Note  Patient Details  Name: ILLYANA SCHORSCH MRN: [id number] Date of Birth: 02-24-1956  Subjective/Objective:                   Crohns disease; small bowel/ right colon phlegmon with intra-abdominal abscess and possible fistulae Action/Plan: Discharge planning Expected Discharge Date:  12/03/14               Expected Discharge Plan:  [place]  In-House Referral:     Discharge planning Services  CM Consult  Post Acute Care Choice:  Home Health Choice offered to:  Patient  DME Arranged:    DME Agency:     HH Arranged:  RN, PT [place] Agency:  [place]  Status of Service:  Completed, signed off  Medicare Important Message Given:  Yes-third notification given Date Medicare IM Given:    Medicare IM give by:    Date Additional Medicare IM Given:    Additional Medicare Important Message give by:     If discussed at [place] of Stay Meetings, dates discussed:    Additional Comments: CM notif Dellie Catholic, RN 12/02/2014, 1:42 PM

## 2014-12-02 NOTE — Progress Notes (Signed)
ANTIBIOTIC CONSULT NOTE - FOLLOW UP  Pharmacy Consult for zosyn Indication: empiric   Allergies  Allergen Reactions  . Lisinopril     cough  . Crestor [Rosuvastatin Calcium]     Muscle and joint aches  . Lipitor [Atorvastatin Calcium]     Muscle cramps  . Azathioprine Other (See Comments)    Side effects to pancrease       Patient Measurements: Height: 5\' 3"  (160 cm) Weight: 205 lb 9 oz (93.243 kg) IBW/kg (Calculated) : 52.4  Vital Signs: Temp: 98.7 F (37.1 C) (07/21 0951) Temp Source: Oral (07/21 0951) BP: 110/52 mmHg (07/21 0953) Pulse Rate: 71 (07/21 0951) Intake/Output from previous day: 07/20 0701 - 07/21 0700 In: 300 [P.O.:300] Out: 2501 [Urine:2501] Intake/Output from this shift: Total I/O In: 240 [P.O.:240] Out: 425 [Urine:425]  Labs:  Recent Labs  11/30/14 0430 12/01/14 0330 12/02/14 0025 12/02/14 0522  WBC  --   --  17.3*  --   HGB  --   --  9.1*  --   PLT  --   --  334  --   CREATININE 0.66 0.61  --  0.67   Estimated Creatinine Clearance: 82.1 mL/min (by C-G formula based on Cr of 0.67). No results for input(s): VANCOTROUGH, VANCOPEAK, VANCORANDOM, GENTTROUGH, GENTPEAK, GENTRANDOM, TOBRATROUGH, TOBRAPEAK, TOBRARND, AMIKACINPEAK, AMIKACINTROU, AMIKACIN in the last 72 hours.   Microbiology: Recent Results (from the past 720 hour(s))  Culture, blood (routine x 2)     Status: None   Collection Time: 11/26/14  6:00 PM  Result Value Ref Range Status   Specimen Description BLOOD RIGHT PICC LINE  Final   Special Requests BOTTLES DRAWN AEROBIC AND ANAEROBIC 10CC  Final   Culture   Final    NO GROWTH 5 DAYS Performed at [facility]    Report Status 12/01/2014 FINAL  Final  Culture, blood (routine x 2)     Status: None (Preliminary result)   Collection Time: 11/26/14  9:00 PM  Result Value Ref Range Status   Specimen Description BLOOD LEFT ANTECUBITAL  Final   Special Requests BOTTLES DRAWN AEROBIC ONLY 4CC  Final   Culture   Final    NO  GROWTH 4 DAYS Performed at [facility]    Report Status PENDING  Incomplete  Culture, Urine     Status: None   Collection Time: 11/26/14 10:34 PM  Result Value Ref Range Status   Specimen Description URINE, RANDOM  Final   Special Requests Immunocompromised  Final   Culture   Final    MULTIPLE SPECIES PRESENT, SUGGEST RECOLLECTION IF CLINICALLY INDICATED Performed at [facility]    Report Status 11/28/2014 FINAL  Final    Anti-infectives    Start     Dose/Rate Route Frequency Ordered Stop   11/26/14 2200  piperacillin-tazobactam (ZOSYN) IVPB 3.375 g     3.375 g 12.5 mL/hr over 240 Minutes Intravenous 3 times per day 11/26/14 1500     11/26/14 1500  piperacillin-tazobactam (ZOSYN) IVPB 3.375 g     3.375 g 100 mL/hr over 30 Minutes Intravenous NOW 11/26/14 1459 11/26/14 1714   11/24/14 2200  cefOXitin (MEFOXIN) 1 g in dextrose 5 % 50 mL IVPB     1 g 100 mL/hr over 30 Minutes Intravenous Every 6 hours 11/24/14 1601 11/25/14 1127   11/24/14 0822  cefOXitin (MEFOXIN) 2 g in dextrose 5 % 50 mL IVPB     2 g 100 mL/hr over 30 Minutes Intravenous On  call to O.R. 11/24/14 1224 11/24/14 1425      Assessment: 89 yoF with hx Crohn's disease s/p bowel resection 30 years ago presents with inflamed bowel with 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 Pt underwent exploratory laparotomy, LOA, small bowel resection, right hemicolectomy, and wound vac placement on 7/13. TNA started 7/15 for ileus. Pharmacy now consulted to start zosyn 7/15 for fevers and possible intra-abdominal infection.   7/15 cxr: Lower lung volumes with platelike opacity in both lower lobes, favor atelectasis over pneumonia.  Afeb,  WBC 17.3, SCr remains wnl.  7/15 >> Zosyn >>  7/15 blood x2: ngtd 7/15 urine: mult species, suggested recollection  Plan:  - continue zosyn 3.375 gm IV q8h (infuse over 4 hours)  Annely Sliva  P 12/02/2014,1:33 PM

## 2014-12-02 NOTE — Progress Notes (Signed)
8 Days Post-Op  Subjective: Passing flatus, no BM yesterday No nausea Tolerating clear liquids Pain minimal Nursing reports that wound looked good with VAC change yesterday PT recommends HH PT - being arranged  Objective: Vital signs in last 24 hours: Temp:  [97.7 F (36.5 C)-99.1 F (37.3 C)] 98.7 F (37.1 C) (07/21 0951) Pulse Rate:  [71-78] 71 (07/21 0951) Resp:  [11-20] 16 (07/21 1133) BP: (92-126)/(43-64) 110/52 mmHg (07/21 0953) SpO2:  [93 %-100 %] 100 % (07/21 1133) Last BM Date: 11/30/14  Intake/Output from previous day: 07/20 0701 - 07/21 0700 In: 300 [P.O.:300] Out: 2501 [Urine:2501] Intake/Output this shift: Total I/O In: 240 [P.O.:240] Out: 425 [Urine:425]  General appearance: alert, cooperative and no distress Resp: clear to auscultation bilaterally Cardio: regular rate and rhythm, S1, S2 normal, no murmur, click, rub or gallop GI: midline incisional tenderness; no RLQ or LLQ tenderness; + BS Wound VAC with good seal; serosanguinous  Lab Results:   Recent Labs  12/02/14 0025  WBC 17.3*  HGB 9.1*  HCT 28.1*  PLT 334   BMET  Recent Labs  12/01/14 0330 12/02/14 0522  NA 133* 132*  K 3.6 3.2*  CL 100* 97*  CO2 28 27  GLUCOSE 130* 130*  BUN 11 10  CREATININE 0.61 0.67  CALCIUM 8.6* 8.7*   PT/INR No results for input(s): LABPROT, INR in the last 72 hours. ABG No results for input(s): PHART, HCO3 in the last 72 hours.  Invalid input(s): PCO2, PO2  Studies/Results: No results found.  Anti-infectives: Anti-infectives    Start     Dose/Rate Route Frequency Ordered Stop   11/26/14 2200  piperacillin-tazobactam (ZOSYN) IVPB 3.375 g     3.375 g 12.5 mL/hr over 240 Minutes Intravenous 3 times per day 11/26/14 1500     11/26/14 1500  piperacillin-tazobactam (ZOSYN) IVPB 3.375 g     3.375 g 100 mL/hr over 30 Minutes Intravenous NOW 11/26/14 1459 11/26/14 1714   11/24/14 2200  cefOXitin (MEFOXIN) 1 g in dextrose 5 % 50 mL IVPB     1 g 100  mL/hr over 30 Minutes Intravenous Every 6 hours 11/24/14 1601 11/25/14 1127   11/24/14 0822  cefOXitin (MEFOXIN) 2 g in dextrose 5 % 50 mL IVPB     2 g 100 mL/hr over 30 Minutes Intravenous On call to O.R. 11/24/14 0822 11/24/14 1425      Assessment/Plan: s/p Procedure(s): EXPLORATORY LAPAROTOMY EXTENSIVE LYSIS OF ADHESIONS SAMLL BOWEL RESECTION RIGHT HEMI COLECTOMY WOUND VAC APPLICATION.  (N/A) SMALL BOWEL RESECTION (N/A) LYSIS OF ADHESION (3 HRS) RIGHT HEMI COLECTOMY APPLICATION OF WOUND VAC Elevated WBC is concerning  Considering her history and the very difficult surgery, will check CT abd/ pelvis to evaluate for intra-abdominal fluid collection/ abscess.  Patient is on Zosyn for presumed pneumonia. Continue clears for now Dulcolax PRN If CT is negative, then will wean TNA tomorrow and advance diet, start PO pain meds - home Saturday  LOS: 8 days    Malaney Mcbean K. 12/02/2014

## 2014-12-03 ENCOUNTER — Inpatient Hospital Stay (HOSPITAL_COMMUNITY): Payer: Medicare Other

## 2014-12-03 ENCOUNTER — Encounter (HOSPITAL_COMMUNITY): Payer: Self-pay | Admitting: Radiology

## 2014-12-03 DIAGNOSIS — N739 Female pelvic inflammatory disease, unspecified: Secondary | ICD-10-CM | POA: Insufficient documentation

## 2014-12-03 LAB — COMPREHENSIVE METABOLIC PANEL
ALT: 16 U/L (ref 14–54)
ANION GAP: 8 (ref 5–15)
AST: 18 U/L (ref 15–41)
Albumin: 2.5 g/dL — ABNORMAL LOW (ref 3.5–5.0)
Alkaline Phosphatase: 99 U/L (ref 38–126)
BUN: 11 mg/dL (ref 6–20)
CHLORIDE: 99 mmol/L — AB (ref 101–111)
CO2: 28 mmol/L (ref 22–32)
CREATININE: 0.69 mg/dL (ref 0.44–1.00)
Calcium: 8.7 mg/dL — ABNORMAL LOW (ref 8.9–10.3)
GFR calc Af Amer: >60 mL/min (ref >60)
Glucose, Bld: 140 mg/dL — ABNORMAL HIGH (ref 65–99)
Potassium: 3.5 mmol/L (ref 3.5–5.1)
Sodium: 135 mmol/L (ref 135–145)
Total Bilirubin: 0.4 mg/dL (ref 0.3–1.2)
Total Protein: 6.9 g/dL (ref 6.5–8.1)

## 2014-12-03 LAB — CBC
HEMATOCRIT: 27.7 % — AB (ref 36.0–46.0)
Hemoglobin: 9.2 g/dL — ABNORMAL LOW (ref 12.0–15.0)
MCH: 30.3 pg (ref 26.0–34.0)
MCHC: 33.2 g/dL (ref 30.0–36.0)
MCV: 91.1 fL (ref 78.0–100.0)
Platelets: 423 10*3/uL — ABNORMAL HIGH (ref 150–400)
RBC: 3.04 MIL/uL — AB (ref 3.87–5.11)
RDW: 14.5 % (ref 11.5–15.5)
WBC: 18.4 10*3/uL — AB (ref 4.0–10.5)

## 2014-12-03 LAB — GLUCOSE, CAPILLARY
GLUCOSE-CAPILLARY: 100 mg/dL — AB (ref 65–99)
GLUCOSE-CAPILLARY: 126 mg/dL — AB (ref 65–99)
GLUCOSE-CAPILLARY: 154 mg/dL — AB (ref 65–99)
Glucose-Capillary: 130 mg/dL — ABNORMAL HIGH (ref 65–99)
Glucose-Capillary: 91 mg/dL (ref 65–99)

## 2014-12-03 MED ORDER — MIDAZOLAM HCL 2 MG/2ML IJ SOLN
INTRAMUSCULAR | Status: AC | PRN
  Administered 2014-12-03 (×4): 1 mg via INTRAVENOUS

## 2014-12-03 MED ORDER — MIDAZOLAM HCL 2 MG/2ML IJ SOLN
INTRAMUSCULAR | Status: AC
  Filled 2014-12-03: qty 6

## 2014-12-03 MED ORDER — MORPHINE SULFATE 2 MG/ML IJ SOLN: 2.0000 mg | INTRAMUSCULAR | Status: DC | PRN

## 2014-12-03 MED ORDER — OXYCODONE-ACETAMINOPHEN 5-325 MG PO TABS
1.0000 | ORAL_TABLET | ORAL | Status: DC | PRN
  Administered 2014-12-03 – 2014-12-05 (×8): 1 via ORAL
  Filled 2014-12-03 (×9): qty 1

## 2014-12-03 MED ORDER — BOOST / RESOURCE BREEZE PO LIQD
1.0000 | Freq: Three times a day (TID) | ORAL | Status: DC
  Administered 2014-12-03: 1 via ORAL

## 2014-12-03 MED ORDER — FENTANYL CITRATE (PF) 100 MCG/2ML IJ SOLN
INTRAMUSCULAR | Status: AC | PRN
  Administered 2014-12-03: 50 ug via INTRAVENOUS
  Administered 2014-12-03: 25 ug via INTRAVENOUS

## 2014-12-03 MED ORDER — FENTANYL CITRATE (PF) 100 MCG/2ML IJ SOLN
INTRAMUSCULAR | Status: AC
  Filled 2014-12-03: qty 4

## 2014-12-03 NOTE — H&P (Signed)
Chief Complaint: Patient was seen in consultation today for pelvic fluid collection at the request of Dr. Georgette Dover   Referring Physician(s): [facility]- Dr. Georgette Dover  History of Present Illness: April May is a 59 y.o. female with crohn's disease who underwent Ex Lap with LOA, small bowel resection, right hemicolectomy and wound vac placement on 11/24/14. The patient has had increasing wbc and intermittent fevers with c/o pelvic pressure with urination. CT done revealed 7.3 cm pelvic fluid collection and IR received consult request for percutaneous drainage. She denies any chest pain, shortness of breath or palpitations. She denies any active signs of bleeding or excessive bruising. The patient does admit to past history of OSA, but does not currently use CPAP. She has previously tolerated sedation without complications.    Past Medical History  Diagnosis Date  . Allergy   . Depression   . Hypertension   . Crohn disease   . Sleep apnea     does not use cpap since weight loss 1 and 1/2 years ago  . Diabetes mellitus type II     borderline, md checks hemaglobin A 1 c at md office     Past Surgical History  Procedure Laterality Date  . Appendectomy    . Small intestine surgery  age 28  . Breast surgery Right 40 yrs ago    benign tumor removed  . Abdominal hysterectomy      1 ovary removed  . Laparotomy N/A 11/24/2014    Procedure: EXPLORATORY LAPAROTOMY EXTENSIVE LYSIS OF ADHESIONS SAMLL BOWEL RESECTION RIGHT HEMI COLECTOMY WOUND VAC APPLICATION. ;  Surgeon: Donnie Mesa, MD;  Location: [facility];  Service: General;  Laterality: N/A;  . Bowel resection N/A 11/24/2014    Procedure: SMALL BOWEL RESECTION;  Surgeon: Donnie Mesa, MD;  Location: [facility];  Service: General;  Laterality: N/A;  . Lysis of adhesion  11/24/2014    Procedure: LYSIS OF ADHESION (3 HRS);  Surgeon: Donnie Mesa, MD;  Location: [facility];  Service: General;;  . Partial colectomy  11/24/2014    Procedure: RIGHT HEMI  COLECTOMY;  Surgeon: Donnie Mesa, MD;  Location: [facility];  Service: General;;  . Application of wound vac  11/24/2014    Procedure: APPLICATION OF WOUND VAC;  Surgeon: Donnie Mesa, MD;  Location: [facility];  Service: General;;    Allergies: Lisinopril; Crestor; Lipitor; and Azathioprine  Medications: Prior to Admission medications   Medication Sig Start Date End Date Taking? Authorizing Provider  AMITIZA 24 MCG capsule Take 24 mcg by mouth daily as needed for constipation.  03/19/14  Yes Historical Provider, MD  buPROPion (WELLBUTRIN SR) 100 MG 12 hr tablet Take by mouth every morning.    Yes Historical Provider, MD  EDARBYCLOR 40-25 MG TABS TAKE 1 TABLET BY MOUTH DAILY. 08/16/14  Yes Janith Lima, MD  fluticasone (FLONASE) 50 MCG/ACT nasal spray Place 2 sprays into both nostrils daily as needed for allergies or rhinitis. 09/14/13  Yes Janith Lima, MD  lamoTRIgine (LAMICTAL) 200 MG tablet Take 200 mg by mouth at bedtime.   Yes Historical Provider, MD  zolpidem (AMBIEN) 10 MG tablet Take 5 mg by mouth at bedtime.  05/24/14  Yes Historical Provider, MD  buPROPion (ZYBAN) 150 MG 12 hr tablet Take 150 mg by mouth every morning.    Historical Provider, MD  ciprofloxacin (CIPRO) 500 MG tablet Take 1 tablet (500 mg total) by mouth 2 (two) times daily. Patient not taking: Reported on 11/11/2014 05/24/14  Orson Eva, MD  metroNIDAZOLE (FLAGYL) 500 MG tablet Take 1 tablet (500 mg total) by mouth 3 (three) times daily. Patient not taking: Reported on 11/11/2014 05/24/14   Orson Eva, MD  predniSONE (DELTASONE) 10 MG tablet Take 3 tablets (30 mg total) by mouth daily with breakfast. Patient not taking: Reported on 11/11/2014 05/24/14   Orson Eva, MD     Family History  Problem Relation Age of Onset  . Hypertension Mother   . Arthritis Father   . Alcohol abuse Paternal Uncle   . Diabetes Paternal Grandmother   . Stroke Paternal Grandmother     History   Social History  . Marital Status: Divorced     Spouse Name: N/A  . Number of Children: N/A  . Years of Education: N/A   Social History Main Topics  . Smoking status: Current Every Day Smoker -- 1.00 packs/day for 35 years    Types: Cigarettes  . Smokeless tobacco: Never Used  . Alcohol Use: No  . Drug Use: No  . Sexual Activity: Not Currently   Other Topics Concern  . None   Social History Narrative   Review of Systems: A 12 point ROS discussed and pertinent positives are indicated in the HPI above.  All other systems are negative.  Review of Systems  Vital Signs: BP 113/41 mmHg  Pulse 68  Temp(Src) 99 F (37.2 C) (Oral)  Resp 20  Ht 5\' 3"  (1.6 m)  Wt 205 lb 9 oz (93.243 kg)  BMI 36.42 kg/m2  SpO2 100%  LMP 05/14/1988  Physical Exam  Constitutional: She is oriented to person, place, and time. No distress.  HENT:  Head: Normocephalic and atraumatic.  Neck: No tracheal deviation present.  Cardiovascular: Normal rate and regular rhythm.  Exam reveals no gallop and no friction rub.   No murmur heard. Pulmonary/Chest: Effort normal and breath sounds normal. No respiratory distress. She has no wheezes. She has no rales.  Abdominal: Soft. Bowel sounds are normal. She exhibits no distension. There is no tenderness.  Wound vac intact  Neurological: She is alert and oriented to person, place, and time.  Skin: Skin is warm and dry. She is not diaphoretic.    Mallampati Score:  MD Evaluation Airway: WNL Heart: WNL Abdomen: WNL Chest/ Lungs: WNL ASA  Classification: 3 Mallampati/Airway Score: Two  Imaging: Dg Chest 2 View  11/26/2014   CLINICAL DATA:  59 year old female with fever and soft tissue ulcer in the buttock region. Weakness and fatigue. Initial encounter.  EXAM: CHEST  2 VIEW  COMPARISON:  03/11/2014 and earlier.  FINDINGS: NG type tube in place, side hole at the level of the distal thoracic esophagus or a GJ (arrow). Lower lung volumes. Platelike left greater than right bilateral lower lobe opacity. No  definite pleural effusion. No pneumothorax or pulmonary edema. Normal cardiac size and mediastinal contours. Negative visible bowel gas pattern. No acute osseous abnormality identified.  IMPRESSION: 1. NG tube in place, advance 5 cm to place the side hole within the stomach. 2. Lower lung volumes with platelike opacity in both lower lobes, favor atelectasis over pneumonia.   Electronically Signed   By: Genevie Ann M.D.   On: 11/26/2014 16:37   Ct Abdomen Pelvis W Contrast  12/02/2014   CLINICAL DATA:  Evaluate for recurrent abscess. History of Crohn's disease.  EXAM: CT ABDOMEN AND PELVIS WITH CONTRAST  TECHNIQUE: Multidetector CT imaging of the abdomen and pelvis was performed using the standard protocol following bolus administration of  intravenous contrast.  CONTRAST:  125mL OMNIPAQUE IOHEXOL 300 MG/ML  SOLN  COMPARISON:  09/15/2014  FINDINGS: Lower chest: Platelike atelectasis is noted within the lower lobes posteriorly. No pleural or pericardial effusion.  Hepatobiliary: Stable small low density structure within the segment 8 of the liver measuring 6 mm, image 18/series 2. The gallbladder appears normal. No biliary dilatation.  Pancreas: The pancreas is normal.  Spleen: Negative  Adrenals/Urinary Tract: The adrenal glands are negative. Normal appearance of both kidneys. A small amount of gas is noted within the urinary bladder.  Stomach/Bowel: Small hiatal hernia. The small bowel loops have a normal caliber and there is no evidence for bowel obstruction. Status post ileocecectomy. There is no extravasation of contrast material. Opacification of the small bowel loops and colon noted. No evidence for bowel obstruction.  Vascular/Lymphatic: Calcified atherosclerotic disease involves the abdominal aorta. No aneurysm. Prominent mesenteric lymph nodes are identified. Right common iliac node is enlarged measuring 1 cm, image 46/series 2.  Reproductive: Previous hysterectomy.  No adnexal mass.  Other: There is extensive  inflammatory changes including fat stranding within the right lower quadrant small bowel mesentery. There is a small fluid collection along the inferior margin of the right hepatic lobe measuring 1.5 x 2.2 x 3.7 cm. Along the inferior margin of the ileo colonic anastomosis possible early fluid collection is identified, image 50/series 2. A large abscess is identified within the pelvis. This measures 7.2 x 7.1 x 7.3 cm, image 64/series 2 and image 60/series 602.  Musculoskeletal: Mild spondylosis noted within the lumbar spine.  IMPRESSION: 1. Examination is positive for multi focal fluid collections within the abdomen and pelvis. The largest is in the right side of pelvis measuring 7.3 cm. 2. Extensive inflammatory changes are identified within the right lower quadrant of the abdomen with marked fat stranding throughout the ileocolonic mesentery 3. Status post ileocecectomy with enterocolonic anastomosis. No extravasation of oral contrast material and no evidence for bowel obstruction.   Electronically Signed   By: Kerby Moors M.D.   On: 12/02/2014 17:13    Labs:  CBC:  Recent Labs  11/28/14 0340 11/29/14 0350 12/02/14 0025 12/03/14 0417  WBC 11.6* 10.2 17.3* 18.4*  HGB 9.2* 9.3* 9.1* 9.2*  HCT 27.4* 27.4* 28.1* 27.7*  PLT 208 221 334 423*    COAGS: No results for input(s): INR, APTT in the last 8760 hours.  BMP:  Recent Labs  11/30/14 0430 12/01/14 0330 12/02/14 0522 12/03/14 0417  NA 132* 133* 132* 135  K 3.3* 3.6 3.2* 3.5  CL 98* 100* 97* 99*  CO2 27 28 27 28   GLUCOSE 138* 130* 130* 140*  BUN 12 11 10 11   CALCIUM 8.5* 8.6* 8.7* 8.7*  CREATININE 0.66 0.61 0.67 0.69  GFRNONAA >60 >60 >60 >60  GFRAA >60 >60 >60 >60    LIVER FUNCTION TESTS:  Recent Labs  11/27/14 0455 11/29/14 0350 12/02/14 0522 12/03/14 0417  BILITOT 0.3 0.2* 0.5 0.4  AST 15 17 18 18   ALT 10* 11* 16 16  ALKPHOS 42 53 96 99  PROT 5.9* 6.0* 6.9 6.9  ALBUMIN 2.5* 2.5* 2.7* 2.5*    Assessment  and Plan: Crohn's disease with intra-abdominal abscess and possible fistula S/p Ex Lap with LOA, small bowel resection, right hemicolectomy and application of abdominal wound vac Leukocytosis, CT revealed pelvic fluid collection Request for IR consult CT reviewed by Dr. Annamaria Boots and fluid collection/possible abscess is amendable to percutaneous drainage via right transgluteal approach The patient has been  NPO, no blood thinners taken, labs and vitals have been reviewed. Risks and Benefits discussed with the patient including bleeding, infection, damage to adjacent structures, bowel perforation/fistula connection, and sepsis. All of the patient's questions were answered, patient is agreeable to proceed. Consent signed and in chart.   Thank you for this interesting consult.  I greatly enjoyed meeting April May and look forward to participating in their care.  A copy of this report was sent to the requesting provider on this date.  SignedHedy Jacob 12/03/2014, 8:42 AM   I spent a total of 40 Minutes in face to face in clinical consultation, greater than 50% of which was counseling/coordinating care for pelvic fluid collection.

## 2014-12-03 NOTE — Progress Notes (Signed)
9 Days Post-Op  Subjective: Patient had a large bowel movement yesterday - felt much better afterwards No fever CT scan showed a large fluid collection in the pelvis - concerning for abscess WBC higher today No sign of leak at anastomosis; no SBO or dilated colon Pain well-controlled  Objective: Vital signs in last 24 hours: Temp:  [97.6 F (36.4 C)-99.6 F (37.6 C)] 99 F (37.2 C) (07/22 0528) Pulse Rate:  [68-86] 68 (07/22 0528) Resp:  [11-20] 20 (07/22 0528) BP: (95-113)/(41-79) 113/41 mmHg (07/22 0528) SpO2:  [98 %-100 %] 100 % (07/22 0528) Last BM Date: 11/30/14  Intake/Output from previous day: 07/21 0701 - 07/22 0700 In: 777 [P.O.:777] Out: 2675 [Urine:2675] Intake/Output this shift:    General appearance: alert, cooperative and no distress Resp: clear to auscultation bilaterally Cardio: regular rate and rhythm, S1, S2 normal, no murmur, click, rub or gallop GI: soft: + BS; mildly tender along incision VAC with good seal; serosanguinous drainage  Lab Results:   Recent Labs  12/02/14 0025 12/03/14 0417  WBC 17.3* 18.4*  HGB 9.1* 9.2*  HCT 28.1* 27.7*  PLT 334 423*   BMET  Recent Labs  12/02/14 0522 12/03/14 0417  NA 132* 135  K 3.2* 3.5  CL 97* 99*  CO2 27 28  GLUCOSE 130* 140*  BUN 10 11  CREATININE 0.67 0.69  CALCIUM 8.7* 8.7*   PT/INR No results for input(s): LABPROT, INR in the last 72 hours. ABG No results for input(s): PHART, HCO3 in the last 72 hours.  Invalid input(s): PCO2, PO2  Studies/Results: Ct Abdomen Pelvis W Contrast  12/02/2014   CLINICAL DATA:  Evaluate for recurrent abscess. History of Crohn's disease.  EXAM: CT ABDOMEN AND PELVIS WITH CONTRAST  TECHNIQUE: Multidetector CT imaging of the abdomen and pelvis was performed using the standard protocol following bolus administration of intravenous contrast.  CONTRAST:  125m OMNIPAQUE IOHEXOL 300 MG/ML  SOLN  COMPARISON:  09/15/2014  FINDINGS: Lower chest: Platelike atelectasis  is noted within the lower lobes posteriorly. No pleural or pericardial effusion.  Hepatobiliary: Stable small low density structure within the segment 8 of the liver measuring 6 mm, image 18/series 2. The gallbladder appears normal. No biliary dilatation.  Pancreas: The pancreas is normal.  Spleen: Negative  Adrenals/Urinary Tract: The adrenal glands are negative. Normal appearance of both kidneys. A small amount of gas is noted within the urinary bladder.  Stomach/Bowel: Small hiatal hernia. The small bowel loops have a normal caliber and there is no evidence for bowel obstruction. Status post ileocecectomy. There is no extravasation of contrast material. Opacification of the small bowel loops and colon noted. No evidence for bowel obstruction.  Vascular/Lymphatic: Calcified atherosclerotic disease involves the abdominal aorta. No aneurysm. Prominent mesenteric lymph nodes are identified. Right common iliac node is enlarged measuring 1 cm, image 46/series 2.  Reproductive: Previous hysterectomy.  No adnexal mass.  Other: There is extensive inflammatory changes including fat stranding within the right lower quadrant small bowel mesentery. There is a small fluid collection along the inferior margin of the right hepatic lobe measuring 1.5 x 2.2 x 3.7 cm. Along the inferior margin of the ileo colonic anastomosis possible early fluid collection is identified, image 50/series 2. A large abscess is identified within the pelvis. This measures 7.2 x 7.1 x 7.3 cm, image 64/series 2 and image 60/series 602.  Musculoskeletal: Mild spondylosis noted within the lumbar spine.  IMPRESSION: 1. Examination is positive for multi focal fluid collections within the abdomen and  pelvis. The largest is in the right side of pelvis measuring 7.3 cm. 2. Extensive inflammatory changes are identified within the right lower quadrant of the abdomen with marked fat stranding throughout the ileocolonic mesentery 3. Status post ileocecectomy with  enterocolonic anastomosis. No extravasation of oral contrast material and no evidence for bowel obstruction.   Electronically Signed   By: Kerby Moors M.D.   On: 12/02/2014 17:13    Anti-infectives: Anti-infectives    Start     Dose/Rate Route Frequency Ordered Stop   11/26/14 2200  piperacillin-tazobactam (ZOSYN) IVPB 3.375 g     3.375 g 12.5 mL/hr over 240 Minutes Intravenous 3 times per day 11/26/14 1500     11/26/14 1500  piperacillin-tazobactam (ZOSYN) IVPB 3.375 g     3.375 g 100 mL/hr over 30 Minutes Intravenous NOW 11/26/14 1459 11/26/14 1714   11/24/14 2200  cefOXitin (MEFOXIN) 1 g in dextrose 5 % 50 mL IVPB     1 g 100 mL/hr over 30 Minutes Intravenous Every 6 hours 11/24/14 1601 11/25/14 1127   11/24/14 0822  cefOXitin (MEFOXIN) 2 g in dextrose 5 % 50 mL IVPB     2 g 100 mL/hr over 30 Minutes Intravenous On call to O.R. 11/24/14 0822 11/24/14 1425      Assessment/Plan: s/p Procedure(s): EXPLORATORY LAPAROTOMY EXTENSIVE LYSIS OF ADHESIONS SAMLL BOWEL RESECTION RIGHT HEMI COLECTOMY WOUND VAC APPLICATION.  (N/A) SMALL BOWEL RESECTION (N/A) LYSIS OF ADHESION (3 HRS) RIGHT HEMI COLECTOMY APPLICATION OF WOUND VAC IR consult for drain placement today  NPO until procedure; then clear liquids, advance as tolerated Discontinue PCA - PO Percocet with PRN Morphine for breakthrough pain Discontinue VAC - begin wet to dry dressings to midline wound Will recheck CBC in AM.   Continue Zosyn until WBC normalizing; will transition to PO Augmentin for 1 week at discharge Discussed with the patient and her nurse today Discharge probably delayed to Sunday or Monday.   LOS: 9 days    April Upperman K. 12/03/2014

## 2014-12-03 NOTE — Progress Notes (Signed)
Nutrition Follow-up   DOCUMENTATION CODES:   Obesity unspecified  INTERVENTION:  - Continue TPN wean per pharmacy - Continue to advance diet as medically feasible - Will order Boost Breeze po TID, each supplement provides 250 kcal and 9 grams of protein - RD will continue to monitor for needs  NUTRITION DIAGNOSIS:   Inadequate oral intake related to inability to eat as evidenced by NPO status. -resolving with advancement to CLD  GOAL:   Patient will meet greater than or equal to 90% of their needs -met with TPN  MONITOR:   Diet advancement, Labs, Weight trends, Skin, I & O's, Other (Comment) (TPN)  ASSESSMENT:   59 year old female with a very long history of Crohn's disease who is status post bowel resection about 30 years ago.   POD #9 ex lap with extensive lysis of adhesions and small bowel resection; hemi colectomy wound vac application following R hemicolectomy. NGT was clamped and d/c'ed 7/20.   Pt to advance to CLD at 3 today. She was on CLD prior to today's procedure and consumed 10% breakfast and 25% lunch 7/20 and 100% of breakfast yesterday without issue.  Order currently in place for Clinimix E 5/15 @ 70 mL/hr with 20% lipids @ 10 mL.  Plan per pharmacy today:  Decrease Clinimix E 5/15 to 35 ml/hr x 2 hrs then D/C.  D/C lipids and TPN labs.  Pt not meeting needs with d/c of TPN and CLD alone; will order Boost Breeze TID. Medications reviewed. Labs reviewed; CBGs: 116-154 mg/dL, Cl: 99 mmol/L, Ca: 8.7 mg/dL.  Diet Order:  TPN (CLINIMIX-E) Adult Diet clear liquid Room service appropriate?: Yes; Fluid consistency:: Thin Diet NPO time specified  Skin:  Wound (see comment) (surgical incision)  Last BM:  7/22  Height:   Ht Readings from Last 1 Encounters:  11/24/14 5' 3"  (1.6 m)    Weight:   Wt Readings from Last 1 Encounters:  11/24/14 205 lb 9 oz (93.243 kg)    Ideal Body Weight:  52.3 kg  Wt Readings from Last 10 Encounters:  11/24/14 205 lb  9 oz (93.243 kg)  11/19/14 205 lb 9.6 oz (93.26 kg)  06/09/14 220 lb (99.791 kg)  05/24/14 227 lb 11.8 oz (103.3 kg)  04/06/14 212 lb 1.3 oz (96.2 kg)  03/11/14 219 lb (99.338 kg)  01/12/14 217 lb (98.431 kg)  09/14/13 220 lb (99.791 kg)  03/28/13 216 lb 3 oz (98.062 kg)  03/04/13 215 lb (97.523 kg)    BMI:  Body mass index is 36.42 kg/(m^2).  Estimated Nutritional Needs:   Kcal:  1600-1800  Protein:  70-80g  Fluid:  1.7L/day  EDUCATION NEEDS:   No education needs identified at this time     Jarome Matin, RD, LDN Inpatient Clinical Dietitian Pager # [phone] After hours/weekend pager # [phone]

## 2014-12-03 NOTE — Care Management Important Message (Signed)
Important Message  Patient Details  Name: April May MRN: [id number] Date of Birth: 03-21-1956   Medicare Important Message Given:  Yes-fourth notification given    Shelda Altes 12/03/2014, 2:21 [place] Message  Patient Details  Name: April May MRN: [id number] Date of Birth: [birth year]   Medicare Important Message Given:  Yes-fourth notification given    Shelda Altes 12/03/2014, 2:21 PM

## 2014-12-03 NOTE — Care Management Note (Addendum)
Case Management Note  Patient Details  Name: April May MRN: [id number] Date of Birth: March 05, 1956  Subjective/Objective:   Anticpate pt will need wet to dry dressing changes after discharge from Hospital                 Action/Plan:   Expected Discharge Date:                  Expected Discharge Plan:  [place]  In-House Referral:     Discharge planning Services  CM Consult  Post Acute Care Choice:  Home Health Choice offered to:  Patient  DME Arranged:    DME Agency:     HH Arranged:  RN, PT [place] Agency:  [place]  Status of Service:  Completed, signed off  Medicare Important Message Given:  Yes-third notification given Date Medicare IM Given:    Medicare IM give by:    Date Additional Medicare IM Given:    Additional Medicare Important Message give by:     If discussed at [place] of Stay Meetings, dates discussed:    Additional Comments:  Delrae Sawyers, RN 12/03/2014, 9:27 AM

## 2014-12-03 NOTE — Progress Notes (Signed)
Anticipate Wound Vac to be discontinued today per bedside RN, Will have [place] who can do wet to dry dressings if ordered by MD.

## 2014-12-03 NOTE — Progress Notes (Signed)
PARENTERAL NUTRITION CONSULT NOTE - Follow up  Pharmacy Consult for TPN Indication: prolonged post-operative ileus  Allergies  Allergen Reactions  . Lisinopril     cough  . Crestor [Rosuvastatin Calcium]     Muscle and joint aches  . Lipitor [Atorvastatin Calcium]     Muscle cramps  . Azathioprine Other (See Comments)    Side effects to pancrease       Patient Measurements: Height: 5\' 3"  (160 cm) Weight: 205 lb 9 oz (93.243 kg) IBW/kg (Calculated) : 52.4 Adjusted Body Weight: 65 kg   Vital Signs: Temp: 99 F (37.2 C) (07/22 0528) Temp Source: Oral (07/22 0528) BP: 113/41 mmHg (07/22 0528) Pulse Rate: 68 (07/22 0528) Intake/Output from previous day: 07/21 0701 - 07/22 0700 In: 777 [P.O.:777] Out: 2675 [Urine:2675] Intake/Output from this shift:    Labs:  Recent Labs  12/02/14 0025 12/03/14 0417  WBC 17.3* 18.4*  HGB 9.1* 9.2*  HCT 28.1* 27.7*  PLT 334 423*     Recent Labs  12/01/14 0330 12/02/14 0522 12/03/14 0417  NA 133* 132* 135  K 3.6 3.2* 3.5  CL 100* 97* 99*  CO2 28 27 28   GLUCOSE 130* 130* 140*  BUN 11 10 11   CREATININE 0.61 0.67 0.69  CALCIUM 8.6* 8.7* 8.7*  MG 1.9 1.8  --   PHOS 3.9 4.2  --   PROT  --  6.9 6.9  ALBUMIN  --  2.7* 2.5*  AST  --  18 18  ALT  --  16 16  ALKPHOS  --  96 99  BILITOT  --  0.5 0.4  Corrected calcium 9.7 Estimated Creatinine Clearance: 82.1 mL/min (by C-G formula based on Cr of 0.69).    Recent Labs  12/02/14 1753 12/02/14 2343 12/03/14 0524  GLUCAP 116* 126* 154*    Medical History: Past Medical History  Diagnosis Date  . Allergy   . Depression   . Hypertension   . Crohn disease   . Sleep apnea     does not use cpap since weight loss 1 and 1/2 years ago  . Diabetes mellitus type II     borderline, md checks hemaglobin A 1 c at md office    Insulin Requirements: 5 units in past 24 hours  Current Nutrition:  - Clear liquids - ate 100% of meals on 7/20 - Clinimix E 5/15 at 70 mL/hr + Fat  Emulsion 20% at 10 mL/hr  IVF:  none  Central access: PICC placed 7/15  TPN start date: 7/15   ASSESSMENT                                                                                                          HPI: 59 y/o F with long history of Crohn's disease and s/p bowel resection ~ 30 years ago presented with persistent phlegmonous mass in abdomen/pelvis, clumped distal small bowel loops, and abscesses.  On 7/13 underwent extensive lysis of adhesions, SB resection, R hemicolectomy, placement of abd wound vac.  On 7/15 had emesis after attempting intermittent NG  clamping. Given extent of surgery anticipate post-operative ileus will be prolonged. Orders received to begin TPN with pharmacy dosing assistance.  Significant events:  7/13: surgery as outlined above 7/15: for PICC placement and TPN start 7/17: TPN advanced to goal rate 7/19: per CCS notes, very small BM and passing a little flatus, to clamp NG tube today. Note that IV lipids were found not hooked up by RN this AM and have since been taken down. Will not restart IV lipids till 6pm with new TPN bag 7/20: Starting clears 7/21: per CCS, plan to wean TPN on 7/22 if CT is negative  Today:   Glucose - at goal <150 with minimal SSI  Electrolytes - at goal.  CoCa wnl  Renal - SCr and BUN WNL  LFTs - wnl  TGs - 171, 226 - up slightly   Prealbumin - 5.6, 6.3  NUTRITIONAL GOALS                                                                                             RD recs: 70-80g protein/day, 1600-1800 KCal/day Clinimix E 5/15 at 70 mL/hr + fat Emulsion 20% at 10 mL/hr would deliver 84 grams protein/day, 1672 KCal/day  PLAN                                                                                                                          Decrease Clinimix E 5/15 to 35 ml/hr x 2 hrs then D/C.  D/C lipids and TPN labs.   Netta Cedars, PharmD, BCPS 12/03/2014 8:48 AM

## 2014-12-03 NOTE — Procedures (Signed)
Interventional Radiology Procedure Note  Procedure: 47F drain placed via right transgluteal approach.  Aspiration yields 80 mL foul smelling mildly turbid bloody fluid.  Drain left to [employer].  Complications: None  Estimated Blood Loss: 0  Recommendations: - Flush drain Q shift - Repeat CT scan with IV contrast if possible prior to drain removal  Signed,  Criselda Peaches, MD

## 2014-12-03 NOTE — Progress Notes (Signed)
Physical Therapy Treatment Patient Details Name: April May MRN: [id number] DOB: 02-12-1956 Today's Date: 12/03/2014    History of Present Illness 59 yo female s/p exp lap SB resection, R hemicolectomy, lysis of adhesions 11/24/14. Hx of Crohn's disease.    PT Comments    Assisted pt OOB to [facility] then amb around unit twice pushing IV pole.  Assist for safety of multiple lines/drain/wound VAC.    Follow Up Recommendations  Home health PT     Equipment Recommendations       Recommendations for Other Services       Precautions / Restrictions Precautions Precautions: Fall Precaution Comments: abdominal surgery, multiple lines/leads Restrictions Weight Bearing Restrictions: No    Mobility  Bed Mobility Overal bed mobility: Needs Assistance Bed Mobility: Supine to Sit;Sit to Supine     Supine to sit: Supervision Sit to supine: Supervision   General bed mobility comments: increased time  Transfers Overall transfer level: Needs assistance Equipment used: None Transfers: Sit to/from [employer] Transfers Sit to Stand: Min guard         General transfer comment: close guard for safety of multiple lines and drain  Ambulation/Gait Ambulation/Gait assistance: Min guard;Supervision Ambulation Distance (Feet): 750 Feet Assistive device: None (pushing IV pole) Gait Pattern/deviations: Step-to pattern;Step-through pattern     General Gait Details: close guard for safety. unsteady but no LOB   [employer] (Stroke Patients Only)       Balance                                    Cognition Arousal/Alertness: Awake/alert Behavior During Therapy: WFL for tasks assessed/performed Overall Cognitive Status: Within Functional Limits for tasks assessed                      Exercises      General Comments        Pertinent Vitals/Pain Pain Assessment: 0-10 Pain Score: 6  Pain  Location: ABD Pain Descriptors / Indicators: Sore Pain Intervention(s): Monitored during session;Repositioned    Home Living                      Prior Function            PT Goals (current goals can now be found in the care plan section) Progress towards PT goals: Progressing toward goals    Frequency  Min 3X/week    PT Plan      Co-evaluation             End of Session Equipment Utilized During Treatment: Gait belt Activity Tolerance: Patient tolerated treatment well Patient left: in bed;with call bell/phone within reach     Time: 1425-1450 PT Time Calculation (min) (ACUTE ONLY): 25 min  Charges:  $Gait Training: 8-22 mins $Therapeutic Activity: 8-22 mins                    G Codes:      April May  PTA [facility]  Acute  Rehab Pager      [phone]

## 2014-12-04 LAB — GLUCOSE, CAPILLARY: Glucose-Capillary: 109 mg/dL — ABNORMAL HIGH (ref 65–99)

## 2014-12-04 LAB — CBC
HCT: 27.7 % — ABNORMAL LOW (ref 36.0–46.0)
HEMOGLOBIN: 9.1 g/dL — AB (ref 12.0–15.0)
MCH: 29.9 pg (ref 26.0–34.0)
MCHC: 32.9 g/dL (ref 30.0–36.0)
MCV: 91.1 fL (ref 78.0–100.0)
Platelets: 472 10*3/uL — ABNORMAL HIGH (ref 150–400)
RBC: 3.04 MIL/uL — AB (ref 3.87–5.11)
RDW: 14.6 % (ref 11.5–15.5)
WBC: 15.9 10*3/uL — AB (ref 4.0–10.5)

## 2014-12-04 MED ORDER — GLUCERNA SHAKE PO LIQD
237.0000 mL | Freq: Three times a day (TID) | ORAL | Status: DC
  Administered 2014-12-04 – 2014-12-05 (×2): 237 mL via ORAL
  Filled 2014-12-04 (×5): qty 237

## 2014-12-04 NOTE — Progress Notes (Signed)
10 Days Post-Op  Subjective: Doing well. No n/v. +flatus/bm. Had drained placed yesterday for pelvic fluid collection - 80cc foul smelling bloody fluid  Objective: Vital signs in last 24 hours: Temp:  [97.7 F (36.5 C)-99.2 F (37.3 C)] 98.4 F (36.9 C) (07/23 0507) Pulse Rate:  [66-81] 76 (07/23 0507) Resp:  [16-26] 18 (07/23 0507) BP: (97-133)/(39-74) 114/59 mmHg (07/23 0507) SpO2:  [95 %-100 %] 100 % (07/23 0507) Last BM Date: 11/30/14  Intake/Output from previous day: 07/22 0701 - 07/23 0700 In: 1110 [P.O.:1050; I.V.:10; IV Piggyback:50] Out: 1700 [Urine:1500; Drains:200] Intake/Output this shift:    Looks good. IS 1800 cta b/l Reg Soft, obese, dressing c/d/i; min TTP; drain -serosang No edema  Lab Results:   Recent Labs  12/02/14 0025 12/03/14 0417  WBC 17.3* 18.4*  HGB 9.1* 9.2*  HCT 28.1* 27.7*  PLT 334 423*   BMET  Recent Labs  12/02/14 0522 12/03/14 0417  NA 132* 135  K 3.2* 3.5  CL 97* 99*  CO2 27 28  GLUCOSE 130* 140*  BUN 10 11  CREATININE 0.67 0.69  CALCIUM 8.7* 8.7*   PT/INR No results for input(s): LABPROT, INR in the last 72 hours. ABG No results for input(s): PHART, HCO3 in the last 72 hours.  Invalid input(s): PCO2, PO2  Studies/Results: Ct Abdomen Pelvis W Contrast  12/02/2014   CLINICAL DATA:  Evaluate for recurrent abscess. History of Crohn's disease.  EXAM: CT ABDOMEN AND PELVIS WITH CONTRAST  TECHNIQUE: Multidetector CT imaging of the abdomen and pelvis was performed using the standard protocol following bolus administration of intravenous contrast.  CONTRAST:  187m OMNIPAQUE IOHEXOL 300 MG/ML  SOLN  COMPARISON:  09/15/2014  FINDINGS: Lower chest: Platelike atelectasis is noted within the lower lobes posteriorly. No pleural or pericardial effusion.  Hepatobiliary: Stable small low density structure within the segment 8 of the liver measuring 6 mm, image 18/series 2. The gallbladder appears normal. No biliary dilatation.   Pancreas: The pancreas is normal.  Spleen: Negative  Adrenals/Urinary Tract: The adrenal glands are negative. Normal appearance of both kidneys. A small amount of gas is noted within the urinary bladder.  Stomach/Bowel: Small hiatal hernia. The small bowel loops have a normal caliber and there is no evidence for bowel obstruction. Status post ileocecectomy. There is no extravasation of contrast material. Opacification of the small bowel loops and colon noted. No evidence for bowel obstruction.  Vascular/Lymphatic: Calcified atherosclerotic disease involves the abdominal aorta. No aneurysm. Prominent mesenteric lymph nodes are identified. Right common iliac node is enlarged measuring 1 cm, image 46/series 2.  Reproductive: Previous hysterectomy.  No adnexal mass.  Other: There is extensive inflammatory changes including fat stranding within the right lower quadrant small bowel mesentery. There is a small fluid collection along the inferior margin of the right hepatic lobe measuring 1.5 x 2.2 x 3.7 cm. Along the inferior margin of the ileo colonic anastomosis possible early fluid collection is identified, image 50/series 2. A large abscess is identified within the pelvis. This measures 7.2 x 7.1 x 7.3 cm, image 64/series 2 and image 60/series 602.  Musculoskeletal: Mild spondylosis noted within the lumbar spine.  IMPRESSION: 1. Examination is positive for multi focal fluid collections within the abdomen and pelvis. The largest is in the right side of pelvis measuring 7.3 cm. 2. Extensive inflammatory changes are identified within the right lower quadrant of the abdomen with marked fat stranding throughout the ileocolonic mesentery 3. Status post ileocecectomy with enterocolonic anastomosis. No extravasation  of oral contrast material and no evidence for bowel obstruction.   Electronically Signed   By: Kerby Moors M.D.   On: 12/02/2014 17:13   Ct Image Guided Drainage Percut Cath  Peritoneal Retroperit  12/03/2014    CLINICAL DATA:  59 year old female with postoperative fever and fluid collection concerning for possible abscess. She presents for CT-guided drain placement.  EXAM: CT ABSCESS DRAIN  Date: 12/03/2014  PROCEDURE: 1. Placement of a 12 French drainage catheter via a right trans gluteal approach Interventional Radiologist:  Criselda Peaches, MD  ANESTHESIA/SEDATION: Moderate (conscious) sedation was used. 4 mg Versed, 75 mcg Fentanyl were administered intravenously. The patient's vital signs were monitored continuously by radiology nursing throughout the procedure.  Sedation Time: 14 minutes  MEDICATIONS: None additional  TECHNIQUE: Informed consent was obtained from the patient following explanation of the procedure, risks, benefits and alternatives. The patient understands, agrees and consents for the procedure. All questions were addressed. A time out was performed.  A planning axial CT scan was performed. The fluid collection in the right hemipelvis was successfully identified. A suitable skin entry site was selected and marked. The region was sterilely prepped and draped in standard fashion using Betadine skin prep. Local anesthesia was attained by infiltration with 1% lidocaine. A small dermatotomy was made. Under intermittent CT guidance, an 18 gauge trocar needle was advanced into the fluid collection. An Amplatz wire was then coiled in the tract dilated to 12 [place]. A Cook 12 [place] all-purpose drainage catheter was then advanced over the wire and formed within the fluid collection. Aspiration yields approximately 80 mL mildly turbid bloody fluid with a foul odor. A sample of fluid was sent for Gram stain and culture.  Post drain placement imaging demonstrates good placement of the Drain and near total collapse of the fluid collection. The catheter was gently flushed and connected to JP bulb suction before being secured to the skin with 0 Prolene suture and an adhesive fixation device. The patient  tolerated the procedure well.  COMPLICATIONS: None  Estimated blood loss: 0  IMPRESSION: Placement of a 12 French drainage catheter into the right pelvic fluid collection via a trans gluteal approach.  Aspiration yields 80 mL foul-smelling bloody fluid. A sample was sent for Gram stain and culture.  PLAN: Maintain drain to JP bulb suction. Recommend repeat CT scan of the abdomen with contrast material prior to tube removal. Additionally, drain injection under fluoro should be considered given history of Crohn's disease and a possibility of fistula. Patient can be followed in IR drain clinic.  Signed,  Criselda Peaches, MD  [facility]  [facility] Rad[facility]   Electronically Signed   By: Jacqulynn Cadet M.D.   On: 12/03/2014 16:52    Anti-infectives: Anti-infectives    Start     Dose/Rate Route Frequency Ordered Stop   11/26/14 2200  piperacillin-tazobactam (ZOSYN) IVPB 3.375 g     3.375 g 12.5 mL/hr over 240 Minutes Intravenous 3 times per day 11/26/14 1500     11/26/14 1500  piperacillin-tazobactam (ZOSYN) IVPB 3.375 g     3.375 g 100 mL/hr over 30 Minutes Intravenous NOW 11/26/14 1459 11/26/14 1714   11/24/14 2200  cefOXitin (MEFOXIN) 1 g in dextrose 5 % 50 mL IVPB     1 g 100 mL/hr over 30 Minutes Intravenous Every 6 hours 11/24/14 1601 11/25/14 1127   11/24/14 0822  cefOXitin (MEFOXIN) 2 g in dextrose 5 % 50 mL IVPB  2 g 100 mL/hr over 30 Minutes Intravenous On call to O.R. 11/24/14 0822 11/24/14 1425      Assessment/Plan: s/p Procedure(s): EXPLORATORY LAPAROTOMY EXTENSIVE LYSIS OF ADHESIONS SAMLL BOWEL RESECTION RIGHT HEMI COLECTOMY WOUND VAC APPLICATION.  (N/A) SMALL BOWEL RESECTION (N/A) LYSIS OF ADHESION (3 HRS) RIGHT HEMI COLECTOMY APPLICATION OF WOUND VAC  Pelvic fluid collections s/p IR drain  Cbc pending Cont to adv diet as tolerated; pt doesn't like resource. Will try glucerna Ambulate, pulm toilet Cont chemical vte  prophylaxis F/u drain cultures  Leighton Ruff. Redmond Pulling, MD, FACS General, Bariatric, & Minimally Invasive Surgery [facility], [place]   LOS: 10 days    Gayland Curry 12/04/2014

## 2014-12-04 NOTE — Progress Notes (Signed)
Referring Physician(s): CCS  Chief Complaint: Pelvic fluid collection s/p perc drain placement 7/22  Subjective: Patient states improved pelvic pressure since drain placement, denies fever or chills.   Allergies: Lisinopril; Crestor; Lipitor; and Azathioprine  Medications: Prior to Admission medications   Medication Sig Start Date End Date Taking? Authorizing Provider  AMITIZA 24 MCG capsule Take 24 mcg by mouth daily as needed for constipation.  03/19/14  Yes Historical Provider, MD  buPROPion (WELLBUTRIN SR) 100 MG 12 hr tablet Take by mouth every morning.    Yes Historical Provider, MD  EDARBYCLOR 40-25 MG TABS TAKE 1 TABLET BY MOUTH DAILY. 08/16/14  Yes Janith Lima, MD  fluticasone (FLONASE) 50 MCG/ACT nasal spray Place 2 sprays into both nostrils daily as needed for allergies or rhinitis. 09/14/13  Yes Janith Lima, MD  lamoTRIgine (LAMICTAL) 200 MG tablet Take 200 mg by mouth at bedtime.   Yes Historical Provider, MD  zolpidem (AMBIEN) 10 MG tablet Take 5 mg by mouth at bedtime.  05/24/14  Yes Historical Provider, MD  buPROPion (ZYBAN) 150 MG 12 hr tablet Take 150 mg by mouth every morning.    Historical Provider, MD  ciprofloxacin (CIPRO) 500 MG tablet Take 1 tablet (500 mg total) by mouth 2 (two) times daily. Patient not taking: Reported on 11/11/2014 05/24/14   Orson Eva, MD  metroNIDAZOLE (FLAGYL) 500 MG tablet Take 1 tablet (500 mg total) by mouth 3 (three) times daily. Patient not taking: Reported on 11/11/2014 05/24/14   Orson Eva, MD  predniSONE (DELTASONE) 10 MG tablet Take 3 tablets (30 mg total) by mouth daily with breakfast. Patient not taking: Reported on 11/11/2014 05/24/14   Orson Eva, MD    Vital Signs: BP 114/59 mmHg  Pulse 76  Temp(Src) 98.4 F (36.9 C) (Oral)  Resp 18  Ht 5\' 3"  (1.6 m)  Wt 205 lb 9 oz (93.243 kg)  BMI 36.42 kg/m2  SpO2 100%  LMP 05/14/1988  Physical Exam General: A&Ox3, NAD, sitting up in chair Abd: Soft, NT, ND, Rt TG perc drain  intact serosang output 10cc in bulb, 50cc/24 hrs, Cx pending   Imaging: Ct Abdomen Pelvis W Contrast  12/02/2014   CLINICAL DATA:  Evaluate for recurrent abscess. History of Crohn's disease.  EXAM: CT ABDOMEN AND PELVIS WITH CONTRAST  TECHNIQUE: Multidetector CT imaging of the abdomen and pelvis was performed using the standard protocol following bolus administration of intravenous contrast.  CONTRAST:  165mL OMNIPAQUE IOHEXOL 300 MG/ML  SOLN  COMPARISON:  09/15/2014  FINDINGS: Lower chest: Platelike atelectasis is noted within the lower lobes posteriorly. No pleural or pericardial effusion.  Hepatobiliary: Stable small low density structure within the segment 8 of the liver measuring 6 mm, image 18/series 2. The gallbladder appears normal. No biliary dilatation.  Pancreas: The pancreas is normal.  Spleen: Negative  Adrenals/Urinary Tract: The adrenal glands are negative. Normal appearance of both kidneys. A small amount of gas is noted within the urinary bladder.  Stomach/Bowel: Small hiatal hernia. The small bowel loops have a normal caliber and there is no evidence for bowel obstruction. Status post ileocecectomy. There is no extravasation of contrast material. Opacification of the small bowel loops and colon noted. No evidence for bowel obstruction.  Vascular/Lymphatic: Calcified atherosclerotic disease involves the abdominal aorta. No aneurysm. Prominent mesenteric lymph nodes are identified. Right common iliac node is enlarged measuring 1 cm, image 46/series 2.  Reproductive: Previous hysterectomy.  No adnexal mass.  Other: There is extensive inflammatory changes  including fat stranding within the right lower quadrant small bowel mesentery. There is a small fluid collection along the inferior margin of the right hepatic lobe measuring 1.5 x 2.2 x 3.7 cm. Along the inferior margin of the ileo colonic anastomosis possible early fluid collection is identified, image 50/series 2. A large abscess is identified  within the pelvis. This measures 7.2 x 7.1 x 7.3 cm, image 64/series 2 and image 60/series 602.  Musculoskeletal: Mild spondylosis noted within the lumbar spine.  IMPRESSION: 1. Examination is positive for multi focal fluid collections within the abdomen and pelvis. The largest is in the right side of pelvis measuring 7.3 cm. 2. Extensive inflammatory changes are identified within the right lower quadrant of the abdomen with marked fat stranding throughout the ileocolonic mesentery 3. Status post ileocecectomy with enterocolonic anastomosis. No extravasation of oral contrast material and no evidence for bowel obstruction.   Electronically Signed   By: Kerby Moors M.D.   On: 12/02/2014 17:13   Ct Image Guided Drainage Percut Cath  Peritoneal Retroperit  12/03/2014   CLINICAL DATA:  59 year old female with postoperative fever and fluid collection concerning for possible abscess. She presents for CT-guided drain placement.  EXAM: CT ABSCESS DRAIN  Date: 12/03/2014  PROCEDURE: 1. Placement of a 12 French drainage catheter via a right trans gluteal approach Interventional Radiologist:  Criselda Peaches, MD  ANESTHESIA/SEDATION: Moderate (conscious) sedation was used. 4 mg Versed, 75 mcg Fentanyl were administered intravenously. The patient's vital signs were monitored continuously by radiology nursing throughout the procedure.  Sedation Time: 14 minutes  MEDICATIONS: None additional  TECHNIQUE: Informed consent was obtained from the patient following explanation of the procedure, risks, benefits and alternatives. The patient understands, agrees and consents for the procedure. All questions were addressed. A time out was performed.  A planning axial CT scan was performed. The fluid collection in the right hemipelvis was successfully identified. A suitable skin entry site was selected and marked. The region was sterilely prepped and draped in standard fashion using Betadine skin prep. Local anesthesia was attained  by infiltration with 1% lidocaine. A small dermatotomy was made. Under intermittent CT guidance, an 18 gauge trocar needle was advanced into the fluid collection. An Amplatz wire was then coiled in the tract dilated to 12 [place]. A Cook 12 [place] all-purpose drainage catheter was then advanced over the wire and formed within the fluid collection. Aspiration yields approximately 80 mL mildly turbid bloody fluid with a foul odor. A sample of fluid was sent for Gram stain and culture.  Post drain placement imaging demonstrates good placement of the Drain and near total collapse of the fluid collection. The catheter was gently flushed and connected to JP bulb suction before being secured to the skin with 0 Prolene suture and an adhesive fixation device. The patient tolerated the procedure well.  COMPLICATIONS: None  Estimated blood loss: 0  IMPRESSION: Placement of a 12 French drainage catheter into the right pelvic fluid collection via a trans gluteal approach.  Aspiration yields 80 mL foul-smelling bloody fluid. A sample was sent for Gram stain and culture.  PLAN: Maintain drain to JP bulb suction. Recommend repeat CT scan of the abdomen with contrast material prior to tube removal. Additionally, drain injection under fluoro should be considered given history of Crohn's disease and a possibility of fistula. Patient can be followed in IR drain clinic.  Signed,  Criselda Peaches, MD  [facility]  [facility]   Electronically  Signed   By: Jacqulynn Cadet M.D.   On: 12/03/2014 16:52    Labs:  CBC:  Recent Labs  11/29/14 0350 12/02/14 0025 12/03/14 0417 12/04/14 0800  WBC 10.2 17.3* 18.4* 15.9*  HGB 9.3* 9.1* 9.2* 9.1*  HCT 27.4* 28.1* 27.7* 27.7*  PLT 221 334 423* 472*    COAGS: No results for input(s): INR, APTT in the last 8760 hours.  BMP:  Recent Labs  11/30/14 0430 12/01/14 0330 12/02/14 0522 12/03/14 0417  NA 132* 133* 132* 135  K  3.3* 3.6 3.2* 3.5  CL 98* 100* 97* 99*  CO2 27 28 27 28   GLUCOSE 138* 130* 130* 140*  BUN 12 11 10 11   CALCIUM 8.5* 8.6* 8.7* 8.7*  CREATININE 0.66 0.61 0.67 0.69  GFRNONAA >60 >60 >60 >60  GFRAA >60 >60 >60 >60    LIVER FUNCTION TESTS:  Recent Labs  11/27/14 0455 11/29/14 0350 12/02/14 0522 12/03/14 0417  BILITOT 0.3 0.2* 0.5 0.4  AST 15 17 18 18   ALT 10* 11* 16 16  ALKPHOS 42 53 96 99  PROT 5.9* 6.0* 6.9 6.9  ALBUMIN 2.5* 2.5* 2.7* 2.5*    Assessment and Plan: Crohn's disease with intra-abdominal abscess and possible fistula S/p Ex Lap with LOA, small bowel resection, right hemicolectomy and application of abdominal wound vac Leukocytosis, CT revealed pelvic fluid collection S/p successful 53F right TG perc drain placed 7/22, wbc trending down, afebrile, follow Cx, continue to flush and monitor output  Plans per CCS Repeat CT scan with IV contrast if possible prior to drain removal   Signed: Tsosie Billing D 12/04/2014, 9:30 AM   I spent a total of 15 Minutes in face to face in clinical consultation/evaluation, greater than 50% of which was counseling/coordinating care for pelvic fluid collection.

## 2014-12-04 NOTE — Plan of Care (Signed)
Problem: Phase II Progression Outcomes Goal: Tolerating diet Outcome: Progressing Tolerating [place] diet

## 2014-12-05 ENCOUNTER — Other Ambulatory Visit: Payer: Self-pay | Admitting: Radiology

## 2014-12-05 DIAGNOSIS — N739 Female pelvic inflammatory disease, unspecified: Secondary | ICD-10-CM

## 2014-12-05 LAB — CBC
HCT: 27.2 % — ABNORMAL LOW (ref 36.0–46.0)
Hemoglobin: 8.6 g/dL — ABNORMAL LOW (ref 12.0–15.0)
MCH: 28.9 pg (ref 26.0–34.0)
MCHC: 31.6 g/dL (ref 30.0–36.0)
MCV: 91.3 fL (ref 78.0–100.0)
PLATELETS: 497 10*3/uL — AB (ref 150–400)
RBC: 2.98 MIL/uL — AB (ref 3.87–5.11)
RDW: 14.5 % (ref 11.5–15.5)
WBC: 16.1 10*3/uL — ABNORMAL HIGH (ref 4.0–10.5)

## 2014-12-05 MED ORDER — AMOXICILLIN-POT CLAVULANATE 875-125 MG PO TABS: 1.0000 | ORAL_TABLET | Freq: Two times a day (BID) | ORAL | Status: DC

## 2014-12-05 MED ORDER — OXYCODONE-ACETAMINOPHEN 5-325 MG PO TABS: 1.0000 | ORAL_TABLET | ORAL | Status: DC | PRN

## 2014-12-05 NOTE — Progress Notes (Signed)
11 Days Post-Op  Subjective: Wet to dry dressings going much better than wound vac. No n/v. Ambulated in halls. 3 bms yesterday. Tolerated solid food  Objective: Vital signs in last 24 hours: Temp:  [98.6 F (37 C)-98.9 F (37.2 C)] 98.8 F (37.1 C) (07/24 0505) Pulse Rate:  [68-79] 77 (07/24 0505) Resp:  [18] 18 (07/24 0505) BP: (104-115)/(56-59) 115/56 mmHg (07/24 0505) SpO2:  [97 %-100 %] 97 % (07/24 0505) Last BM Date: 12/04/14  Intake/Output from previous day: 07/23 0701 - 07/24 0700 In: 1180 [P.O.:1080; IV Piggyback:100] Out: 120 [Urine:100; Drains:20] Intake/Output this shift:    Alert, sitting in chair, smiling cta b/l Reg Soft, obese, dressing c/d/i; +bs, nontender; JP - light serosang No edema  Lab Results:   Recent Labs  12/04/14 0800 12/05/14 0556  WBC 15.9* 16.1*  HGB 9.1* 8.6*  HCT 27.7* 27.2*  PLT 472* 497*   BMET  Recent Labs  12/03/14 0417  NA 135  K 3.5  CL 99*  CO2 28  GLUCOSE 140*  BUN 11  CREATININE 0.69  CALCIUM 8.7*   PT/INR No results for input(s): LABPROT, INR in the last 72 hours. ABG No results for input(s): PHART, HCO3 in the last 72 hours.  Invalid input(s): PCO2, PO2  Studies/Results: Ct Image Guided Drainage Percut Cath  Peritoneal Retroperit  12/03/2014   CLINICAL DATA:  59 year old female with postoperative fever and fluid collection concerning for possible abscess. She presents for CT-guided drain placement.  EXAM: CT ABSCESS DRAIN  Date: 12/03/2014  PROCEDURE: 1. Placement of a 12 French drainage catheter via a right trans gluteal approach Interventional Radiologist:  Criselda Peaches, MD  ANESTHESIA/SEDATION: Moderate (conscious) sedation was used. 4 mg Versed, 75 mcg Fentanyl were administered intravenously. The patient's vital signs were monitored continuously by radiology nursing throughout the procedure.  Sedation Time: 14 minutes  MEDICATIONS: None additional  TECHNIQUE: Informed consent was obtained from the  patient following explanation of the procedure, risks, benefits and alternatives. The patient understands, agrees and consents for the procedure. All questions were addressed. A time out was performed.  A planning axial CT scan was performed. The fluid collection in the right hemipelvis was successfully identified. A suitable skin entry site was selected and marked. The region was sterilely prepped and draped in standard fashion using Betadine skin prep. Local anesthesia was attained by infiltration with 1% lidocaine. A small dermatotomy was made. Under intermittent CT guidance, an 18 gauge trocar needle was advanced into the fluid collection. An Amplatz wire was then coiled in the tract dilated to 12 [place]. A Cook 12 [place] all-purpose drainage catheter was then advanced over the wire and formed within the fluid collection. Aspiration yields approximately 80 mL mildly turbid bloody fluid with a foul odor. A sample of fluid was sent for Gram stain and culture.  Post drain placement imaging demonstrates good placement of the Drain and near total collapse of the fluid collection. The catheter was gently flushed and connected to JP bulb suction before being secured to the skin with 0 Prolene suture and an adhesive fixation device. The patient tolerated the procedure well.  COMPLICATIONS: None  Estimated blood loss: 0  IMPRESSION: Placement of a 12 French drainage catheter into the right pelvic fluid collection via a trans gluteal approach.  Aspiration yields 80 mL foul-smelling bloody fluid. A sample was sent for Gram stain and culture.  PLAN: Maintain drain to JP bulb suction. Recommend repeat CT scan of the abdomen with contrast material  prior to tube removal. Additionally, drain injection under fluoro should be considered given history of Crohn's disease and a possibility of fistula. Patient can be followed in IR drain clinic.  Signed,  Criselda Peaches, MD  [facility]   [facility]   Electronically Signed   By: Jacqulynn Cadet M.D.   On: 12/03/2014 16:52    Anti-infectives: Anti-infectives    Start     Dose/Rate Route Frequency Ordered Stop   11/26/14 2200  piperacillin-tazobactam (ZOSYN) IVPB 3.375 g     3.375 g 12.5 mL/hr over 240 Minutes Intravenous 3 times per day 11/26/14 1500     11/26/14 1500  piperacillin-tazobactam (ZOSYN) IVPB 3.375 g     3.375 g 100 mL/hr over 30 Minutes Intravenous NOW 11/26/14 1459 11/26/14 1714   11/24/14 2200  cefOXitin (MEFOXIN) 1 g in dextrose 5 % 50 mL IVPB     1 g 100 mL/hr over 30 Minutes Intravenous Every 6 hours 11/24/14 1601 11/25/14 1127   11/24/14 0822  cefOXitin (MEFOXIN) 2 g in dextrose 5 % 50 mL IVPB     2 g 100 mL/hr over 30 Minutes Intravenous On call to O.R. 11/24/14 0822 11/24/14 1425      Assessment/Plan: s/p Procedure(s): EXPLORATORY LAPAROTOMY EXTENSIVE LYSIS OF ADHESIONS SAMLL BOWEL RESECTION RIGHT HEMI COLECTOMY WOUND VAC APPLICATION.  (N/A) SMALL BOWEL RESECTION (N/A) LYSIS OF ADHESION (3 HRS) RIGHT HEMI COLECTOMY APPLICATION OF WOUND VAC Pelvic fluid collection s/p IR drain placement  Doing well. Tolerating diet. WBC still elevated but No fever. No tachycardia. Drain cx NTD, min abd pain Pt feels ready to go home. i think she is stable to go home. She has had drain teaching.  Discussed dc instructions.  Pt will need HHN for dressing, PT 1 week of oral augmentin Will need f/u CT in IR drain clinic  Yesmin Mutch M. Redmond Pulling, MD, FACS General, Bariatric, & Minimally Invasive Surgery [facility], [place]      LOS: 11 days    Gayland Curry 12/05/2014

## 2014-12-05 NOTE — Discharge Instructions (Signed)
[facility], [place] [phone]  OPEN ABDOMINAL SURGERY: POST OP INSTRUCTIONS  Always review your discharge instruction sheet given to you by the facility where your surgery was performed.  IF YOU HAVE DISABILITY OR FAMILY LEAVE FORMS, YOU MUST BRING THEM TO THE OFFICE FOR PROCESSING.  PLEASE DO NOT GIVE THEM TO YOUR DOCTOR.  1. A prescription for pain medication may be given to you upon discharge.  Take your pain medication as prescribed, if needed.  If narcotic pain medicine is not needed, then you may take acetaminophen (Tylenol) or ibuprofen (Advil) as needed. 2. Take your usually prescribed medications unless otherwise directed. 3. If you need a refill on your pain medication, please contact your pharmacy. They will contact our office to request authorization.  Prescriptions will not be filled after 5pm or on week-ends. 4. You should follow a light diet the first few days after arrival home, such as soup and crackers, pudding, etc.unless your doctor has advised otherwise. A high-fiber, low fat diet can be resumed as tolerated.   Be sure to include lots of fluids daily. Most patients will experience some swelling and bruising on the chest and neck area.  Ice packs will help.  Swelling and bruising can take several days to resolve 5. Most patients will experience some swelling and bruising in the area of the incision. Ice pack will help. Swelling and bruising can take several days to resolve..  6. It is common to experience some constipation if taking pain medication after surgery.  Increasing fluid intake and taking a stool softener will usually help or prevent this problem from occurring.  A mild laxative (Milk of Magnesia or Miralax) should be taken according to package directions if there are no bowel movements after 48 hours. 7.  You have an open incision - it will need to be changed daily. It can get wet in the shower.  8. ACTIVITIES:  You may resume regular (light) daily  activities beginning the next day--such as daily self-care, walking, climbing stairs--gradually increasing activities as tolerated.  You may have sexual intercourse when it is comfortable.  Refrain from any heavy lifting or straining until approved by your doctor. a. You may drive when you no longer are taking prescription pain medication, you can comfortably wear a seatbelt, and you can safely maneuver your car and apply brakes b. Return to Work: ___________________________________ 110. You should see your doctor in the office for a follow-up appointment approximately two weeks after your surgery.  Make sure that you call for this appointment within a day or two after you arrive home to insure a convenient appointment time. OTHER INSTRUCTIONS:  _____________________________________________________________ _____________________________________________________________  WHEN TO CALL YOUR DOCTOR: 1. Fever over 101.0 2. Inability to urinate 3. Nausea and/or vomiting 4. Extreme swelling or bruising 5. Continued bleeding from incision. 6. Increased pain, redness, or drainage from the incision. 7. Difficulty swallowing or breathing 8. Muscle cramping or spasms.  The clinic staff is available to answer your questions during regular business hours.  Please dont hesitate to call and ask to speak to one of the nurses if you have concerns.  For further questions, please visit [email or web address]  Bulb Drain Home Care A bulb drain consists of a thin rubber tube and a soft, round bulb that creates a gentle suction. The rubber tube is placed in the area where you had surgery. A bulb is attached to the end of the tube that is outside the body. The  bulb drain removes excess fluid that normally builds up in a surgical wound after surgery. The color and amount of fluid will vary. Immediately after surgery, the fluid is bright red and is a little thicker than water. It may gradually change to a yellow  or pink color and become more thin and water-like. When the amount decreases to about 1 or 2 tbsp in 24 hours, your health care provider will usually remove it. DAILY CARE  Keep the bulb flat (compressed) at all times, except while emptying it. The flatness creates suction. You can flatten the bulb by squeezing it firmly in the middle and then closing the cap.  Keep sites where the tube enters the skin dry and covered with a bandage (dressing).  Secure the tube 1-2 in (2.5-5.1 cm) below the insertion sites to keep it from pulling on your stitches. The tube is stitched in place and will not slip out.  Secure the bulb as directed by your health care provider.  For the first 3 days after surgery, there usually is more fluid in the bulb. Empty the bulb whenever it becomes half full because the bulb does not create enough suction if it is too full. The bulb could also overflow. Write down how much fluid you remove each time you empty your drain. Add up the amount removed in 24 hours.  Empty the bulb at the same time every day once the amount of fluid decreases and you only need to empty it once a day. Write down the amounts and the 24-hour totals to give to your health care provider. This helps your health care provider know when the tubes can be removed. EMPTYING THE BULB DRAIN Before emptying the bulb, get a measuring cup, a piece of paper and a pen, and wash your hands.  Gently run your fingers down the tube (stripping) to empty any drainage from the tubing into the bulb. This may need to be done several times a day to clear the tubing of clots and tissue.  Open the bulb cap to release suction, which causes it to inflate. Do not touch the inside of the cap.  Gently run your fingers down the tube (stripping) to empty any drainage from the tubing into the bulb.  Hold the cap out of the way, and pour fluid into the measuring cup.   Squeeze the bulb to provide suction.  Replace the cap.    Check the tape that holds the tube to your skin. If it is becoming loose, you can remove the loose piece of tape and apply a new one. Then, pin the bulb to your shirt.   Write down the amount of fluid you emptied out. Write down the date and each time you emptied your bulb drain. (If there are 2 bulbs, note the amount of drainage from each bulb and keep the totals separate. Your health care provider will want to know the total amounts for each drain and which tube is draining more.)   Flush the fluid down the toilet and wash your hands.   Call your health care provider once you have less than 2 tbsp of fluid collecting in the bulb drain every 24 hours. If there is drainage around the tube site, change dressings and keep the area dry. Cleanse around tube with sterile saline and place dry gauze around site. This gauze should be changed when it is soiled. If it stays clean and unsoiled, it should still be changed daily.  [place]  CARE IF:  Your drainage has a bad smell or is cloudy.   You have a fever.   Your drainage is increasing instead of decreasing.   Your tube fell out.   You have redness or swelling around the tube site.   You have drainage from a surgical wound.   Your bulb drain will not stay flat after you empty it.  MAKE SURE YOU:   Understand these instructions.  Will watch your condition.  Will get help right away if you are not doing well or get worse. Document Released: 04/27/2000 Document Revised: 09/14/2013 Document Reviewed: 10/03/2011 [facility] Patient Information 2015 [place], [place]. This information is not intended to replace advice given to you by your health care provider. Make sure you discuss any questions you have with your health care provider.

## 2014-12-05 NOTE — Progress Notes (Signed)
Discussed discharge instructions with patient and daughter until no further questions ask. Daughter able to  Answer questions about Jp care,  dressing change and wet to dry midline dressing change. Patient and daughter aware of advanced home care to visit and have phone numbers for advance home care and md. PICC dressing (where PICC removed) remains dry and intact. Assessment unchanged.

## 2014-12-05 NOTE — Care Management Note (Signed)
Case Management Note  Patient Details  Name: JOYCELINE MAIORINO MRN: [id number] Date of Birth: November 29, 1955  Subjective/Objective:       abd surgery             Action/Plan:  Home Health Expected Discharge Date:  12/05/2014              Expected Discharge Plan:  [place]  In-House Referral:     Discharge planning Services  CM Consult  Post Acute Care Choice:  Home Health Choice offered to:  Patient     HH Arranged:  RN, PT [facility] Agency:  [place]  Status of Service:  Completed, signed off  Medicare Important Message Given:  Yes-fourth notification given Date Medicare IM Given:    Medicare IM give by:    Date Additional Medicare IM Given:    Additional Medicare Important Message give by:     If discussed at [place] of Stay Meetings, dates discussed:    Additional Comments: NCM notified [facility] of scheduled dc home today with HH. Pt has wet to dry dressing and JP drain.  Erenest Rasher, RN 12/05/2014, 9:20 AM

## 2014-12-06 ENCOUNTER — Other Ambulatory Visit: Payer: Self-pay | Admitting: General Surgery

## 2014-12-06 DIAGNOSIS — N739 Female pelvic inflammatory disease, unspecified: Secondary | ICD-10-CM

## 2014-12-06 DIAGNOSIS — K50814 Crohn's disease of both small and large intestine with abscess: Secondary | ICD-10-CM | POA: Diagnosis not present

## 2014-12-06 DIAGNOSIS — Z48815 Encounter for surgical aftercare following surgery on the digestive system: Secondary | ICD-10-CM | POA: Diagnosis not present

## 2014-12-06 LAB — CULTURE, ROUTINE-ABSCESS: Culture: NO GROWTH

## 2014-12-07 DIAGNOSIS — Z48815 Encounter for surgical aftercare following surgery on the digestive system: Secondary | ICD-10-CM | POA: Diagnosis not present

## 2014-12-07 DIAGNOSIS — K50814 Crohn's disease of both small and large intestine with abscess: Secondary | ICD-10-CM | POA: Diagnosis not present

## 2014-12-08 DIAGNOSIS — K50814 Crohn's disease of both small and large intestine with abscess: Secondary | ICD-10-CM | POA: Diagnosis not present

## 2014-12-08 DIAGNOSIS — Z48815 Encounter for surgical aftercare following surgery on the digestive system: Secondary | ICD-10-CM | POA: Diagnosis not present

## 2014-12-08 NOTE — Discharge Summary (Signed)
Physician Discharge Summary  Patient ID: April May MRN: [id number] DOB/AGE: May 23, 1955 59 y.o.  Admit date: 11/24/2014 Discharge date: 12/05/14  Admission Diagnoses:Crohn's disease of the terminal ileum/ right colon with intra-loop abscesses/ fistulae  Discharge Diagnoses: same Active Problems:   Crohn's disease of both small and large intestine with fistula   Pelvic abscess in female   Discharged Condition: good  Hospital Course: Exploratory laparotomy/ extensive lysis of adhesions/ right hemicolectomy with small bowel resection on 11/24/14.  The patient had a prolonged post-operative ileus, which was not unexpected.  She had a PICC line placed and started TNA on POD #2.  Her WBC initially was high, but came down to normal levels by POD#4.  However, she did have some fever.  Work-up pointed towards a pulmonary source, so she was started on Zosyn.  On POD#6 she began having some bowel function.  Her wound was managed with a VAC during her hospitalization.  On POD#8, her WBC jumped to 17.  I ordered a CT of the abdomen and pelvis which showed a large pelvic fluid collection.  A drain was placed by IR and her WBC decreased.  The cultures from the fluid did not show any growth.  However, she improved and regained full bowel function.  We removed the VAC and began wet to dry dressings.  On POD#11 she was ready for discharge with home health RN and PT.    Consults: WOCN, Pharmacy/ Nutrition, Interventional Radiology  Significant Diagnostic Studies: radiology: CT scan: Pelvic abscess seen on 12/02/14 - drained 12/03/14  Treatments: antibiotics: Zosyn, analgesia: Dilaudid and Morphine, TPN and surgery: as above  Discharge Exam: Blood pressure 115/56, pulse 77, temperature 98.8 F (37.1 C), temperature source Oral, resp. rate 18, height 5' 3"  (1.6 m), weight 93.243 kg (205 lb 9 oz), last menstrual period 05/14/1988, SpO2 97 %. General appearance: alert, cooperative and no distress Neck: no  JVD Resp: rhonchi bibasilar Cardio: regular rate and rhythm, S1, S2 normal, no murmur, click, rub or gallop GI: soft, incisional tenderness; + bowel sounds wound clean; beginning to granulate  Disposition: 06-Home-Health Care Svc  Discharge Instructions    Change dressing (specify)    Complete by:  As directed   Dressing change: 1 times per day using see above.     Diet Carb Modified    Complete by:  As directed      Discharge instructions    Complete by:  As directed   Empty drain as taught and record output daily Wet to dry dressing to open midline incision     Discharge wound care:    Complete by:  As directed   Remove old bandage/gauze/tape, place moist gauze into base of wound, cover with dry gauze and tape. Perform at least once a day     Driving Restrictions    Complete by:  As directed   No driving while taking pain medication during daytime     Increase activity slowly    Complete by:  As directed      Lifting restrictions    Complete by:  As directed   No lifting, pushing, or pulling anything greater than 10 lbs for 5 weeks            Medication List    STOP taking these medications        buPROPion 100 MG 12 hr tablet  Commonly known as:  WELLBUTRIN SR     ciprofloxacin 500 MG tablet  Commonly known as:  CIPRO  metroNIDAZOLE 500 MG tablet  Commonly known as:  FLAGYL     predniSONE 10 MG tablet  Commonly known as:  DELTASONE      TAKE these medications        AMITIZA 24 MCG capsule  Generic drug:  lubiprostone  Take 24 mcg by mouth daily as needed for constipation.     amoxicillin-clavulanate 875-125 MG per tablet  Commonly known as:  AUGMENTIN  Take 1 tablet by mouth 2 (two) times daily.     buPROPion 150 MG 12 hr tablet  Commonly known as:  ZYBAN  Take 150 mg by mouth every morning.     EDARBYCLOR 40-25 MG Tabs  Generic drug:  Azilsartan-Chlorthalidone  TAKE 1 TABLET BY MOUTH DAILY.     fluticasone 50 MCG/ACT nasal spray  Commonly  known as:  FLONASE  Place 2 sprays into both nostrils daily as needed for allergies or rhinitis.     lamoTRIgine 200 MG tablet  Commonly known as:  LAMICTAL  Take 200 mg by mouth at bedtime.     oxyCODONE-acetaminophen 5-325 MG per tablet  Commonly known as:  PERCOCET/ROXICET  Take 1 tablet by mouth every 4 (four) hours as needed for moderate pain.     zolpidem 10 MG tablet  Commonly known as:  AMBIEN  Take 5 mg by mouth at bedtime.           Follow-up Information    Follow up with April May.   Why:  home health nurse and physical therapy   Contact information:   [address] [place] [address] [phone]       Follow up with April May., MD. Schedule an appointment as soon as possible for a visit in 2 weeks.   Specialty:  General Surgery   Why:  For wound re-check   Contact information:   [address] [place] [address] [phone]       Signed: Raoul Ciano K. 12/08/2014, 8:02 AM

## 2014-12-09 DIAGNOSIS — Z48815 Encounter for surgical aftercare following surgery on the digestive system: Secondary | ICD-10-CM | POA: Diagnosis not present

## 2014-12-09 DIAGNOSIS — K50814 Crohn's disease of both small and large intestine with abscess: Secondary | ICD-10-CM | POA: Diagnosis not present

## 2014-12-09 DIAGNOSIS — R197 Diarrhea, unspecified: Secondary | ICD-10-CM | POA: Diagnosis not present

## 2014-12-09 DIAGNOSIS — S31109D Unspecified open wound of abdominal wall, unspecified quadrant without penetration into peritoneal cavity, subsequent encounter: Secondary | ICD-10-CM | POA: Diagnosis not present

## 2014-12-10 DIAGNOSIS — K50814 Crohn's disease of both small and large intestine with abscess: Secondary | ICD-10-CM | POA: Diagnosis not present

## 2014-12-10 DIAGNOSIS — Z48815 Encounter for surgical aftercare following surgery on the digestive system: Secondary | ICD-10-CM | POA: Diagnosis not present

## 2014-12-13 DIAGNOSIS — K50814 Crohn's disease of both small and large intestine with abscess: Secondary | ICD-10-CM | POA: Diagnosis not present

## 2014-12-13 DIAGNOSIS — Z48815 Encounter for surgical aftercare following surgery on the digestive system: Secondary | ICD-10-CM | POA: Diagnosis not present

## 2014-12-15 ENCOUNTER — Ambulatory Visit
Admission: RE | Admit: 2014-12-15 | Discharge: 2014-12-15 | Disposition: A | Discharge status: Home / Self Care | Payer: Medicare Other | Source: Ambulatory Visit | Attending: General Surgery | Admitting: General Surgery

## 2014-12-15 ENCOUNTER — Ambulatory Visit
Admission: RE | Admit: 2014-12-15 | Discharge: 2014-12-15 | Disposition: A | Discharge status: Home / Self Care | Payer: Medicare Other | Source: Ambulatory Visit | Attending: Radiology | Admitting: Radiology

## 2014-12-15 DIAGNOSIS — K50814 Crohn's disease of both small and large intestine with abscess: Secondary | ICD-10-CM | POA: Diagnosis not present

## 2014-12-15 DIAGNOSIS — N739 Female pelvic inflammatory disease, unspecified: Secondary | ICD-10-CM

## 2014-12-15 DIAGNOSIS — S31109D Unspecified open wound of abdominal wall, unspecified quadrant without penetration into peritoneal cavity, subsequent encounter: Secondary | ICD-10-CM | POA: Diagnosis not present

## 2014-12-15 DIAGNOSIS — Z48815 Encounter for surgical aftercare following surgery on the digestive system: Secondary | ICD-10-CM | POA: Diagnosis not present

## 2014-12-15 DIAGNOSIS — K651 Peritoneal abscess: Secondary | ICD-10-CM | POA: Diagnosis not present

## 2014-12-15 MED ORDER — IOPAMIDOL (ISOVUE-300) INJECTION 61%
125.0000 mL | Freq: Once | INTRAVENOUS | Status: AC | PRN
  Administered 2014-12-15: 125 mL via INTRAVENOUS

## 2014-12-15 NOTE — Consult Note (Signed)
Chief Complaint: Pelvic abscess status post percutaneous drainage. Outpatient follow-up.  Referring Physician(s): Morgan,Koreen D  History of Present Illness: April May is a 59 y.o. female with a prior history of Crohn's disease, status post recent bowel surgery. Right pelvic abscess underwent CT-guided percutaneous drainage. Patient now returns for outpatient follow-up with imaging. She is recovering very well at home. No interval fevers, flank or abdominal pain. No pelvic pain. Minimal drain catheter output, less than 5 mL. Output is thin and blood-tinged. No purulent or exudative component. No fecal contamination. Overall she is doing very well. Follow-up CT imaging demonstrates resolved pelvic abscess. Drain catheter injection demonstrates no fistula. Drain catheter is ready for removal today.  Past Medical History  Diagnosis Date  . Allergy   . Depression   . Hypertension   . Crohn disease   . Sleep apnea     does not use cpap since weight loss 1 and 1/2 years ago  . Diabetes mellitus type II     borderline, md checks hemaglobin A 1 c at md office     Past Surgical History  Procedure Laterality Date  . Appendectomy    . Small intestine surgery  age 63  . Breast surgery Right 40 yrs ago    benign tumor removed  . Abdominal hysterectomy      1 ovary removed  . Laparotomy N/A 11/24/2014    Procedure: EXPLORATORY LAPAROTOMY EXTENSIVE LYSIS OF ADHESIONS SAMLL BOWEL RESECTION RIGHT HEMI COLECTOMY WOUND VAC APPLICATION. ;  Surgeon: Donnie Mesa, MD;  Location: [facility];  Service: General;  Laterality: N/A;  . Bowel resection N/A 11/24/2014    Procedure: SMALL BOWEL RESECTION;  Surgeon: Donnie Mesa, MD;  Location: [facility];  Service: General;  Laterality: N/A;  . Lysis of adhesion  11/24/2014    Procedure: LYSIS OF ADHESION (3 HRS);  Surgeon: Donnie Mesa, MD;  Location: [facility];  Service: General;;  . Partial colectomy  11/24/2014    Procedure: RIGHT HEMI COLECTOMY;   Surgeon: Donnie Mesa, MD;  Location: [facility];  Service: General;;  . Application of wound vac  11/24/2014    Procedure: APPLICATION OF WOUND VAC;  Surgeon: Donnie Mesa, MD;  Location: [facility];  Service: General;;    Allergies: Lisinopril; Crestor; Lipitor; and Azathioprine  Medications: Prior to Admission medications   Medication Sig Start Date End Date Taking? Authorizing Provider  AMITIZA 24 MCG capsule Take 24 mcg by mouth daily as needed for constipation.  03/19/14   Historical Provider, MD  amoxicillin-clavulanate (AUGMENTIN) 875-125 MG per tablet Take 1 tablet by mouth 2 (two) times daily. 12/05/14   Greer Pickerel, MD  buPROPion (ZYBAN) 150 MG 12 hr tablet Take 150 mg by mouth every morning.    Historical Provider, MD  EDARBYCLOR 40-25 MG TABS TAKE 1 TABLET BY MOUTH DAILY. 08/16/14   Janith Lima, MD  fluticasone (FLONASE) 50 MCG/ACT nasal spray Place 2 sprays into both nostrils daily as needed for allergies or rhinitis. 09/14/13   Janith Lima, MD  lamoTRIgine (LAMICTAL) 200 MG tablet Take 200 mg by mouth at bedtime.    Historical Provider, MD  oxyCODONE-acetaminophen (PERCOCET/ROXICET) 5-325 MG per tablet Take 1 tablet by mouth every 4 (four) hours as needed for moderate pain. 12/05/14   Greer Pickerel, MD  zolpidem (AMBIEN) 10 MG tablet Take 5 mg by mouth at bedtime.  05/24/14   Historical Provider, MD     Family History  Problem Relation Age of Onset  .  Hypertension Mother   . Arthritis Father   . Alcohol abuse Paternal Uncle   . Diabetes Paternal Grandmother   . Stroke Paternal Grandmother     History   Social History  . Marital Status: Divorced    Spouse Name: N/A  . Number of Children: N/A  . Years of Education: N/A   Social History Main Topics  . Smoking status: Current Every Day Smoker -- 1.00 packs/day for 35 years    Types: Cigarettes  . Smokeless tobacco: Never Used  . Alcohol Use: No  . Drug Use: No  . Sexual Activity: Not Currently   Other Topics Concern  .  Not on file   Social History Narrative    Review of Systems: A 12 point ROS discussed and pertinent positives are indicated in the HPI above.  All other systems are negative.  Review of Systems  Constitutional: Negative for fever, activity change, appetite change, fatigue and unexpected weight change.  Respiratory: Negative for cough and shortness of breath.   Cardiovascular: Negative for chest pain.  Gastrointestinal: Negative for abdominal distention.  Genitourinary: Negative for hematuria and flank pain.    Vital Signs: LMP 05/14/1988  Physical Exam  Constitutional: She appears well-nourished. No distress.  Abdominal: Soft. Bowel sounds are normal. She exhibits no distension. There is no tenderness.  Trans-gluteal pelvic abscess drainage catheter site is clean and dry. Output in the collection bulb is thin and blood-tinged. No exudative or fecal contaminated component.  Skin: She is not diaphoretic.    Imaging: Dg Chest 2 View  11/26/2014   CLINICAL DATA:  59 year old female with fever and soft tissue ulcer in the buttock region. Weakness and fatigue. Initial encounter.  EXAM: CHEST  2 VIEW  COMPARISON:  03/11/2014 and earlier.  FINDINGS: NG type tube in place, side hole at the level of the distal thoracic esophagus or a GJ (arrow). Lower lung volumes. Platelike left greater than right bilateral lower lobe opacity. No definite pleural effusion. No pneumothorax or pulmonary edema. Normal cardiac size and mediastinal contours. Negative visible bowel gas pattern. No acute osseous abnormality identified.  IMPRESSION: 1. NG tube in place, advance 5 cm to place the side hole within the stomach. 2. Lower lung volumes with platelike opacity in both lower lobes, favor atelectasis over pneumonia.   Electronically Signed   By: Genevie Ann M.D.   On: 11/26/2014 16:37   Ct Abdomen Pelvis W Contrast  12/15/2014   CLINICAL DATA:  Followup pelvic abscess. History of Crohn's disease. Drain placed  12/03/2014.  EXAM: CT ABDOMEN AND PELVIS WITH CONTRAST  TECHNIQUE: Multidetector CT imaging of the abdomen and pelvis was performed using the standard protocol following bolus administration of intravenous contrast.  CONTRAST:  158mL ISOVUE-300 IOPAMIDOL (ISOVUE-300) INJECTION 61%  COMPARISON:  12/02/2014  FINDINGS: Lung bases are clear.  No effusions.  Heart is normal size.  Pigtail catheter drain is in place in the pelvis. The previously seen fluid collection the pelvis has resolved. No further current scratch head no residual fluid collection.  Liver, spleen, pancreas, adrenals and kidneys are unremarkable. Postsurgical changes noted with midline incision and surgical changes within the transverse colon from partial right colectomy. Stranding noted within the mesentery/ peritoneum, compatible with postoperative changes. This has improved since prior study. No additional fluid collections currently.  Aorta and iliac vessels are calcified, non aneurysmal. No free fluid, free air or adenopathy.  IMPRESSION: Resolution of the previously seen fluid collection the pelvis. No residual fluid collection currently.  Postoperative changes from right colectomy.   Electronically Signed   By: Rolm Baptise M.D.   On: 12/15/2014 13:06   Ct Abdomen Pelvis W Contrast  12/02/2014   CLINICAL DATA:  Evaluate for recurrent abscess. History of Crohn's disease.  EXAM: CT ABDOMEN AND PELVIS WITH CONTRAST  TECHNIQUE: Multidetector CT imaging of the abdomen and pelvis was performed using the standard protocol following bolus administration of intravenous contrast.  CONTRAST:  114mL OMNIPAQUE IOHEXOL 300 MG/ML  SOLN  COMPARISON:  09/15/2014  FINDINGS: Lower chest: Platelike atelectasis is noted within the lower lobes posteriorly. No pleural or pericardial effusion.  Hepatobiliary: Stable small low density structure within the segment 8 of the liver measuring 6 mm, image 18/series 2. The gallbladder appears normal. No biliary dilatation.   Pancreas: The pancreas is normal.  Spleen: Negative  Adrenals/Urinary Tract: The adrenal glands are negative. Normal appearance of both kidneys. A small amount of gas is noted within the urinary bladder.  Stomach/Bowel: Small hiatal hernia. The small bowel loops have a normal caliber and there is no evidence for bowel obstruction. Status post ileocecectomy. There is no extravasation of contrast material. Opacification of the small bowel loops and colon noted. No evidence for bowel obstruction.  Vascular/Lymphatic: Calcified atherosclerotic disease involves the abdominal aorta. No aneurysm. Prominent mesenteric lymph nodes are identified. Right common iliac node is enlarged measuring 1 cm, image 46/series 2.  Reproductive: Previous hysterectomy.  No adnexal mass.  Other: There is extensive inflammatory changes including fat stranding within the right lower quadrant small bowel mesentery. There is a small fluid collection along the inferior margin of the right hepatic lobe measuring 1.5 x 2.2 x 3.7 cm. Along the inferior margin of the ileo colonic anastomosis possible early fluid collection is identified, image 50/series 2. A large abscess is identified within the pelvis. This measures 7.2 x 7.1 x 7.3 cm, image 64/series 2 and image 60/series 602.  Musculoskeletal: Mild spondylosis noted within the lumbar spine.  IMPRESSION: 1. Examination is positive for multi focal fluid collections within the abdomen and pelvis. The largest is in the right side of pelvis measuring 7.3 cm. 2. Extensive inflammatory changes are identified within the right lower quadrant of the abdomen with marked fat stranding throughout the ileocolonic mesentery 3. Status post ileocecectomy with enterocolonic anastomosis. No extravasation of oral contrast material and no evidence for bowel obstruction.   Electronically Signed   By: Kerby Moors M.D.   On: 12/02/2014 17:13   Ct Image Guided Drainage Percut Cath  Peritoneal Retroperit  12/03/2014    CLINICAL DATA:  59 year old female with postoperative fever and fluid collection concerning for possible abscess. She presents for CT-guided drain placement.  EXAM: CT ABSCESS DRAIN  Date: 12/03/2014  PROCEDURE: 1. Placement of a 12 French drainage catheter via a right trans gluteal approach Interventional Radiologist:  Criselda Peaches, MD  ANESTHESIA/SEDATION: Moderate (conscious) sedation was used. 4 mg Versed, 75 mcg Fentanyl were administered intravenously. The patient's vital signs were monitored continuously by radiology nursing throughout the procedure.  Sedation Time: 14 minutes  MEDICATIONS: None additional  TECHNIQUE: Informed consent was obtained from the patient following explanation of the procedure, risks, benefits and alternatives. The patient understands, agrees and consents for the procedure. All questions were addressed. A time out was performed.  A planning axial CT scan was performed. The fluid collection in the right hemipelvis was successfully identified. A suitable skin entry site was selected and marked. The region was sterilely prepped and draped in  standard fashion using Betadine skin prep. Local anesthesia was attained by infiltration with 1% lidocaine. A small dermatotomy was made. Under intermittent CT guidance, an 18 gauge trocar needle was advanced into the fluid collection. An Amplatz wire was then coiled in the tract dilated to 12 [place]. A Cook 12 [place] all-purpose drainage catheter was then advanced over the wire and formed within the fluid collection. Aspiration yields approximately 80 mL mildly turbid bloody fluid with a foul odor. A sample of fluid was sent for Gram stain and culture.  Post drain placement imaging demonstrates good placement of the Drain and near total collapse of the fluid collection. The catheter was gently flushed and connected to JP bulb suction before being secured to the skin with 0 Prolene suture and an adhesive fixation device. The patient  tolerated the procedure well.  COMPLICATIONS: None  Estimated blood loss: 0  IMPRESSION: Placement of a 12 French drainage catheter into the right pelvic fluid collection via a trans gluteal approach.  Aspiration yields 80 mL foul-smelling bloody fluid. A sample was sent for Gram stain and culture.  PLAN: Maintain drain to JP bulb suction. Recommend repeat CT scan of the abdomen with contrast material prior to tube removal. Additionally, drain injection under fluoro should be considered given history of Crohn's disease and a possibility of fistula. Patient can be followed in IR drain clinic.  Signed,  Criselda Peaches, MD  [facility]  [facility]   Electronically Signed   By: Jacqulynn Cadet M.D.   On: 12/03/2014 16:52    Labs:  CBC:  Recent Labs  12/02/14 0025 12/03/14 0417 12/04/14 0800 12/05/14 0556  WBC 17.3* 18.4* 15.9* 16.1*  HGB 9.1* 9.2* 9.1* 8.6*  HCT 28.1* 27.7* 27.7* 27.2*  PLT 334 423* 472* 497*    COAGS: No results for input(s): INR, APTT in the last 8760 hours.  BMP:  Recent Labs  11/30/14 0430 12/01/14 0330 12/02/14 0522 12/03/14 0417  NA 132* 133* 132* 135  K 3.3* 3.6 3.2* 3.5  CL 98* 100* 97* 99*  CO2 27 28 27 28   GLUCOSE 138* 130* 130* 140*  BUN 12 11 10 11   CALCIUM 8.5* 8.6* 8.7* 8.7*  CREATININE 0.66 0.61 0.67 0.69  GFRNONAA >60 >60 >60 >60  GFRAA >60 >60 >60 >60    LIVER FUNCTION TESTS:  Recent Labs  11/27/14 0455 11/29/14 0350 12/02/14 0522 12/03/14 0417  BILITOT 0.3 0.2* 0.5 0.4  AST 15 17 18 18   ALT 10* 11* 16 16  ALKPHOS 42 53 96 99  PROT 5.9* 6.0* 6.9 6.9  ALBUMIN 2.5* 2.5* 2.7* 2.5*    Assessment and Plan:  Resolved right pelvic abscess status post percutaneous drainage. CT imaging demonstrates no residual abscess or new fluid collection. Fluoroscopic tube injection is negative for fistula.  Plan: Drain catheter will be removed today.  Thank you for this interesting consult.   I greatly enjoyed meeting April May and look forward to participating in their care.  A copy of this report was sent to the requesting provider on this date.  SignedGreggory Keen 12/15/2014, 2:31 PM   I spent a total of  30 Minutes   in face to face in clinical consultation, greater than 50% of which was counseling/coordinating care for pelvic abscess drain.

## 2014-12-16 DIAGNOSIS — K50814 Crohn's disease of both small and large intestine with abscess: Secondary | ICD-10-CM | POA: Diagnosis not present

## 2014-12-16 DIAGNOSIS — Z48815 Encounter for surgical aftercare following surgery on the digestive system: Secondary | ICD-10-CM | POA: Diagnosis not present

## 2014-12-20 DIAGNOSIS — Z48815 Encounter for surgical aftercare following surgery on the digestive system: Secondary | ICD-10-CM | POA: Diagnosis not present

## 2014-12-20 DIAGNOSIS — K50814 Crohn's disease of both small and large intestine with abscess: Secondary | ICD-10-CM | POA: Diagnosis not present

## 2014-12-23 DIAGNOSIS — K50814 Crohn's disease of both small and large intestine with abscess: Secondary | ICD-10-CM | POA: Diagnosis not present

## 2014-12-23 DIAGNOSIS — S31109D Unspecified open wound of abdominal wall, unspecified quadrant without penetration into peritoneal cavity, subsequent encounter: Secondary | ICD-10-CM | POA: Diagnosis not present

## 2014-12-23 DIAGNOSIS — Z48815 Encounter for surgical aftercare following surgery on the digestive system: Secondary | ICD-10-CM | POA: Diagnosis not present

## 2014-12-28 DIAGNOSIS — Z48815 Encounter for surgical aftercare following surgery on the digestive system: Secondary | ICD-10-CM | POA: Diagnosis not present

## 2014-12-28 DIAGNOSIS — K50814 Crohn's disease of both small and large intestine with abscess: Secondary | ICD-10-CM | POA: Diagnosis not present

## 2014-12-30 DIAGNOSIS — S31109D Unspecified open wound of abdominal wall, unspecified quadrant without penetration into peritoneal cavity, subsequent encounter: Secondary | ICD-10-CM | POA: Diagnosis not present

## 2015-01-04 DIAGNOSIS — S31109D Unspecified open wound of abdominal wall, unspecified quadrant without penetration into peritoneal cavity, subsequent encounter: Secondary | ICD-10-CM | POA: Diagnosis not present

## 2015-01-06 DIAGNOSIS — K50814 Crohn's disease of both small and large intestine with abscess: Secondary | ICD-10-CM | POA: Diagnosis not present

## 2015-01-06 DIAGNOSIS — Z48815 Encounter for surgical aftercare following surgery on the digestive system: Secondary | ICD-10-CM | POA: Diagnosis not present

## 2015-01-13 DIAGNOSIS — K50814 Crohn's disease of both small and large intestine with abscess: Secondary | ICD-10-CM | POA: Diagnosis not present

## 2015-01-13 DIAGNOSIS — Z48815 Encounter for surgical aftercare following surgery on the digestive system: Secondary | ICD-10-CM | POA: Diagnosis not present

## 2015-01-19 DIAGNOSIS — K50814 Crohn's disease of both small and large intestine with abscess: Secondary | ICD-10-CM | POA: Diagnosis not present

## 2015-01-19 DIAGNOSIS — Z48815 Encounter for surgical aftercare following surgery on the digestive system: Secondary | ICD-10-CM | POA: Diagnosis not present

## 2015-02-01 DIAGNOSIS — K50814 Crohn's disease of both small and large intestine with abscess: Secondary | ICD-10-CM | POA: Diagnosis not present

## 2015-02-01 DIAGNOSIS — Z48815 Encounter for surgical aftercare following surgery on the digestive system: Secondary | ICD-10-CM | POA: Diagnosis not present

## 2015-02-02 ENCOUNTER — Other Ambulatory Visit: Payer: Self-pay | Admitting: Internal Medicine

## 2015-02-02 DIAGNOSIS — Z1231 Encounter for screening mammogram for malignant neoplasm of breast: Secondary | ICD-10-CM

## 2015-02-03 ENCOUNTER — Other Ambulatory Visit: Payer: Self-pay

## 2015-02-03 MED ORDER — AZILSARTAN-CHLORTHALIDONE 40-25 MG PO TABS: 1.0000 | ORAL_TABLET | Freq: Every day | ORAL | Status: DC

## 2015-02-04 ENCOUNTER — Ambulatory Visit (HOSPITAL_COMMUNITY)
Admission: RE | Admit: 2015-02-04 | Discharge: 2015-02-04 | Disposition: A | Discharge status: Home / Self Care | Payer: Medicare Other | Source: Ambulatory Visit | Attending: Internal Medicine | Admitting: Internal Medicine

## 2015-02-04 DIAGNOSIS — Z1231 Encounter for screening mammogram for malignant neoplasm of breast: Secondary | ICD-10-CM | POA: Diagnosis not present

## 2015-02-04 DIAGNOSIS — R928 Other abnormal and inconclusive findings on diagnostic imaging of breast: Secondary | ICD-10-CM | POA: Insufficient documentation

## 2015-02-07 LAB — HM MAMMOGRAPHY: HM Mammogram: ABNORMAL

## 2015-02-08 ENCOUNTER — Other Ambulatory Visit: Payer: Self-pay | Admitting: Internal Medicine

## 2015-02-08 DIAGNOSIS — R928 Other abnormal and inconclusive findings on diagnostic imaging of breast: Secondary | ICD-10-CM

## 2015-02-11 DIAGNOSIS — L918 Other hypertrophic disorders of the skin: Secondary | ICD-10-CM | POA: Diagnosis not present

## 2015-02-18 ENCOUNTER — Telehealth: Payer: Self-pay | Admitting: Internal Medicine

## 2015-02-18 NOTE — Telephone Encounter (Signed)
Pt called in and would like nurse to call her about result of some test that was sent over.  She would not give me any other info.  I tried .  Sorry gabby !!    Best number [phone]

## 2015-02-21 ENCOUNTER — Ambulatory Visit: Payer: Medicare Other | Admitting: Internal Medicine

## 2015-02-21 NOTE — Telephone Encounter (Signed)
Pt states she already spoke to someone. Wanted to inform of mammogram results.

## 2015-02-24 ENCOUNTER — Other Ambulatory Visit (INDEPENDENT_AMBULATORY_CARE_PROVIDER_SITE_OTHER): Payer: Medicare Other

## 2015-02-24 ENCOUNTER — Ambulatory Visit (INDEPENDENT_AMBULATORY_CARE_PROVIDER_SITE_OTHER): Payer: Medicare Other | Admitting: Internal Medicine

## 2015-02-24 ENCOUNTER — Ambulatory Visit
Admission: RE | Admit: 2015-02-24 | Discharge: 2015-02-24 | Disposition: A | Discharge status: Home / Self Care | Payer: Medicare Other | Source: Ambulatory Visit | Attending: Internal Medicine | Admitting: Internal Medicine

## 2015-02-24 ENCOUNTER — Encounter: Payer: Self-pay | Admitting: Internal Medicine

## 2015-02-24 ENCOUNTER — Telehealth: Payer: Self-pay | Admitting: Internal Medicine

## 2015-02-24 ENCOUNTER — Telehealth: Payer: Self-pay | Admitting: *Deleted

## 2015-02-24 VITALS — BP 110/78 | HR 70 | Temp 98.4°F | Resp 16 | Ht 63.6 in | Wt 197.0 lb

## 2015-02-24 DIAGNOSIS — D72829 Elevated white blood cell count, unspecified: Secondary | ICD-10-CM

## 2015-02-24 DIAGNOSIS — J302 Other seasonal allergic rhinitis: Secondary | ICD-10-CM | POA: Diagnosis not present

## 2015-02-24 DIAGNOSIS — E118 Type 2 diabetes mellitus with unspecified complications: Secondary | ICD-10-CM

## 2015-02-24 DIAGNOSIS — I10 Essential (primary) hypertension: Secondary | ICD-10-CM

## 2015-02-24 DIAGNOSIS — E7800 Pure hypercholesterolemia, unspecified: Secondary | ICD-10-CM

## 2015-02-24 DIAGNOSIS — Z23 Encounter for immunization: Secondary | ICD-10-CM

## 2015-02-24 DIAGNOSIS — D510 Vitamin B12 deficiency anemia due to intrinsic factor deficiency: Secondary | ICD-10-CM | POA: Insufficient documentation

## 2015-02-24 DIAGNOSIS — E876 Hypokalemia: Secondary | ICD-10-CM

## 2015-02-24 DIAGNOSIS — R928 Other abnormal and inconclusive findings on diagnostic imaging of breast: Secondary | ICD-10-CM | POA: Diagnosis not present

## 2015-02-24 DIAGNOSIS — K50813 Crohn's disease of both small and large intestine with fistula: Secondary | ICD-10-CM

## 2015-02-24 LAB — CBC WITH DIFFERENTIAL/PLATELET
BASOS PCT: 0.4 % (ref 0.0–3.0)
Basophils Absolute: 0 10*3/uL (ref 0.0–0.1)
EOS PCT: 1.3 % (ref 0.0–5.0)
Eosinophils Absolute: 0.2 10*3/uL (ref 0.0–0.7)
HEMATOCRIT: 43.5 % (ref 36.0–46.0)
HEMOGLOBIN: 14.2 g/dL (ref 12.0–15.0)
LYMPHS PCT: 24.8 % (ref 12.0–46.0)
Lymphs Abs: 2.9 10*3/uL (ref 0.7–4.0)
MCHC: 32.7 g/dL (ref 30.0–36.0)
MCV: 91.8 fl (ref 78.0–100.0)
MONO ABS: 0.9 10*3/uL (ref 0.1–1.0)
MONOS PCT: 7.6 % (ref 3.0–12.0)
Neutro Abs: 7.8 10*3/uL — ABNORMAL HIGH (ref 1.4–7.7)
Neutrophils Relative %: 65.9 % (ref 43.0–77.0)
Platelets: 326 10*3/uL (ref 150.0–400.0)
RBC: 4.74 Mil/uL (ref 3.87–5.11)
RDW: 14.4 % (ref 11.5–15.5)
WBC: 11.8 10*3/uL — ABNORMAL HIGH (ref 4.0–10.5)

## 2015-02-24 LAB — URINALYSIS, ROUTINE W REFLEX MICROSCOPIC
BILIRUBIN URINE: NEGATIVE
Hgb urine dipstick: NEGATIVE
KETONES UR: NEGATIVE
LEUKOCYTES UA: NEGATIVE
NITRITE: NEGATIVE
Specific Gravity, Urine: 1.02 (ref 1.000–1.030)
Total Protein, Urine: NEGATIVE
Urine Glucose: NEGATIVE
Urobilinogen, UA: 0.2 (ref 0.0–1.0)
pH: 6 (ref 5.0–8.0)

## 2015-02-24 LAB — BASIC METABOLIC PANEL
BUN: 8 mg/dL (ref 6–23)
CALCIUM: 9.6 mg/dL (ref 8.4–10.5)
CHLORIDE: 100 meq/L (ref 96–112)
CO2: 36 mEq/L — ABNORMAL HIGH (ref 19–32)
CREATININE: 0.79 mg/dL (ref 0.40–1.20)
GFR: 95.69 mL/min (ref >60.00)
Glucose, Bld: 107 mg/dL — ABNORMAL HIGH (ref 70–99)
Potassium: 2.8 mEq/L — CL (ref 3.5–5.1)
Sodium: 145 mEq/L (ref 135–145)

## 2015-02-24 LAB — HEMOGLOBIN A1C: Hgb A1c MFr Bld: 5.9 % (ref 4.6–6.5)

## 2015-02-24 LAB — IBC PANEL
Iron: 50 ug/dL (ref 42–145)
SATURATION RATIOS: 13.9 % — AB (ref 20.0–50.0)
Transferrin: 257 mg/dL (ref 212.0–360.0)

## 2015-02-24 LAB — MICROALBUMIN / CREATININE URINE RATIO
CREATININE, U: 239.2 mg/dL
MICROALB UR: 1.3 mg/dL (ref 0.0–1.9)
Microalb Creat Ratio: 0.5 mg/g (ref 0.0–30.0)

## 2015-02-24 LAB — LIPID PANEL
CHOL/HDL RATIO: 3
Cholesterol: 118 mg/dL (ref 0–200)
HDL: 35.4 mg/dL — AB (ref >39.00)
LDL Cholesterol: 58 mg/dL (ref 0–99)
NonHDL: 82.38
TRIGLYCERIDES: 120 mg/dL (ref 0.0–149.0)
VLDL: 24 mg/dL (ref 0.0–40.0)

## 2015-02-24 LAB — TSH: TSH: 1.33 u[IU]/mL (ref 0.35–4.50)

## 2015-02-24 LAB — FERRITIN: Ferritin: 26.7 ng/mL (ref 10.0–291.0)

## 2015-02-24 LAB — FOLATE: Folate: 15.2 ng/mL (ref >5.9)

## 2015-02-24 LAB — VITAMIN B12: Vitamin B-12: 245 pg/mL (ref 211–911)

## 2015-02-24 MED ORDER — AZILSARTAN-CHLORTHALIDONE 40-25 MG PO TABS: 1.0000 | ORAL_TABLET | Freq: Every day | ORAL | Status: DC

## 2015-02-24 MED ORDER — POTASSIUM CHLORIDE CRYS ER 20 MEQ PO TBCR: 20.0000 meq | EXTENDED_RELEASE_TABLET | Freq: Three times a day (TID) | ORAL | Status: DC

## 2015-02-24 MED ORDER — GLUCOSE BLOOD VI STRP: ORAL_STRIP | Status: DC

## 2015-02-24 MED ORDER — ONETOUCH VERIO FLEX SYSTEM W/DEVICE KIT: 1.0000 | PACK | Freq: Two times a day (BID) | Status: DC

## 2015-02-24 MED ORDER — FLUTICASONE PROPIONATE 50 MCG/ACT NA SUSP: 2.0000 | Freq: Every day | NASAL | Status: DC | PRN

## 2015-02-24 NOTE — Telephone Encounter (Signed)
ok 

## 2015-02-24 NOTE — Progress Notes (Signed)
Subjective:  Patient ID: April May, female    DOB: February 01, 1956  Age: 59 y.o. MRN: [id number]  CC: Anemia; Diabetes; Hyperlipidemia; and Hypertension   HPI April May presents for follow on anemia and crohn's disease, she is s/p surgical intervention and antibiotics for an abscess. She has persistent diarrhea, about 3-4 loose stools per day. She does not complain of abd pain.  Outpatient Prescriptions Prior to Visit  Medication Sig Dispense Refill  . AMITIZA 24 MCG capsule Take 24 mcg by mouth daily as needed for constipation.   12  . buPROPion (ZYBAN) 150 MG 12 hr tablet Take 150 mg by mouth every morning.    . lamoTRIgine (LAMICTAL) 200 MG tablet Take 200 mg by mouth at bedtime.    Marland Kitchen zolpidem (AMBIEN) 10 MG tablet Take 5 mg by mouth at bedtime.   3  . amoxicillin-clavulanate (AUGMENTIN) 875-125 MG per tablet Take 1 tablet by mouth 2 (two) times daily. 14 tablet 3  . Azilsartan-Chlorthalidone (EDARBYCLOR) 40-25 MG TABS Take 1 tablet by mouth daily. 30 tablet 0  . fluticasone (FLONASE) 50 MCG/ACT nasal spray Place 2 sprays into both nostrils daily as needed for allergies or rhinitis. 16 g 11  . oxyCODONE-acetaminophen (PERCOCET/ROXICET) 5-325 MG per tablet Take 1 tablet by mouth every 4 (four) hours as needed for moderate pain. 40 tablet 0   No facility-administered medications prior to visit.    ROS Review of Systems  Constitutional: Positive for fatigue and unexpected weight change (wt loss). Negative for fever, chills, diaphoresis and appetite change.  HENT: Negative.   Eyes: Negative for visual disturbance.  Respiratory: Negative.  Negative for cough, choking, chest tightness, shortness of breath and stridor.   Cardiovascular: Negative.  Negative for chest pain, palpitations and leg swelling.  Gastrointestinal: Positive for diarrhea. Negative for nausea, vomiting, abdominal pain, constipation, blood in stool and rectal pain.  Endocrine: Positive for polydipsia.  Negative for polyphagia and polyuria.  Genitourinary: Negative.   Musculoskeletal: Negative.  Negative for myalgias, back pain, joint swelling and arthralgias.  Skin: Negative.  Negative for color change and rash.  Allergic/Immunologic: Negative.   Neurological: Negative.  Negative for dizziness, tremors, syncope, light-headedness and headaches.  Hematological: Negative.   Psychiatric/Behavioral: Negative.     Objective:  BP 110/78 mmHg  Pulse 70  Temp(Src) 98.4 F (36.9 C) (Oral)  Resp 16  Ht 5' 3.6" (1.615 m)  Wt 197 lb (89.359 kg)  BMI 34.26 kg/m2  SpO2 96%  LMP 05/14/1988  BP Readings from Last 3 Encounters:  02/24/15 110/78  12/05/14 115/56  11/19/14 116/61    Wt Readings from Last 3 Encounters:  02/24/15 197 lb (89.359 kg)  11/24/14 205 lb 9 oz (93.243 kg)  11/19/14 205 lb 9.6 oz (93.26 kg)    Physical Exam  Constitutional: She is oriented to person, place, and time. She appears well-developed and well-nourished. No distress.  HENT:  Head: Normocephalic and atraumatic.  Mouth/Throat: No oropharyngeal exudate.  Eyes: Conjunctivae are normal. Right eye exhibits no discharge. Left eye exhibits no discharge. No scleral icterus.  Neck: Normal range of motion. Neck supple. No JVD present. No tracheal deviation present. No thyromegaly present.  Cardiovascular: Normal rate, regular rhythm, normal heart sounds and intact distal pulses.  Exam reveals no gallop and no friction rub.   No murmur heard. Pulmonary/Chest: Effort normal and breath sounds normal. No stridor. No respiratory distress. She has no wheezes. She has no rales. She exhibits no tenderness.  Abdominal: Soft.  Bowel sounds are normal. She exhibits no distension and no mass. There is no rebound and no guarding.  Musculoskeletal: Normal range of motion. She exhibits no edema or tenderness.  Lymphadenopathy:    She has no cervical adenopathy.  Neurological: She is oriented to person, place, and time.  Skin:  Skin is warm and dry. No rash noted. She is not diaphoretic. No erythema. No pallor.  Psychiatric: She has a normal mood and affect. Her behavior is normal. Judgment and thought content normal.  Vitals reviewed.   Lab Results  Component Value Date   WBC 11.8* 02/24/2015   HGB 14.2 02/24/2015   HCT 43.5 02/24/2015   PLT 326.0 02/24/2015   GLUCOSE 107* 02/24/2015   CHOL 118 02/24/2015   TRIG 120.0 02/24/2015   HDL 35.40* 02/24/2015   LDLCALC 58 02/24/2015   ALT 16 12/03/2014   AST 18 12/03/2014   NA 145 02/24/2015   K 2.8* 02/24/2015   CL 100 02/24/2015   CREATININE 0.79 02/24/2015   BUN 8 02/24/2015   CO2 36* 02/24/2015   TSH 1.33 02/24/2015   HGBA1C 5.9 02/24/2015   MICROALBUR 1.3 02/24/2015    Mm Digital Screening Bilateral  02/07/2015  CLINICAL DATA:  Screening. EXAM: DIGITAL SCREENING BILATERAL MAMMOGRAM WITH CAD COMPARISON:  Previous exam(s). ACR Breast Density Category b: There are scattered areas of fibroglandular density. FINDINGS: In the left breast, a possible asymmetry warrants further evaluation. In the right breast, no findings suspicious for malignancy. Images were processed with CAD. IMPRESSION: Further evaluation is suggested for possible asymmetry in the left breast. RECOMMENDATION: Diagnostic mammogram and possibly ultrasound of the left breast. (Code:[id number]) The patient will be contacted regarding the findings, and additional imaging will be scheduled. BI-RADS CATEGORY  0: Incomplete. Need additional imaging evaluation and/or prior mammograms for comparison. Electronically Signed   By: Fidela Salisbury M.D.   On: 02/07/2015 08:45    Assessment & Plan:   Andreya was seen today for anemia, diabetes, hyperlipidemia and hypertension.  Diagnoses and all orders for this visit:  Other seasonal allergic rhinitis -     fluticasone (FLONASE) 50 MCG/ACT nasal spray; Place 2 sprays into both nostrils daily as needed for allergies or rhinitis.  Need for influenza  vaccination -     Flu Vaccine QUAD 36+ mos IM  Need for vaccination with 13-polyvalent pneumococcal conjugate vaccine -     Pneumococcal conjugate vaccine 13-valent  Type 2 diabetes mellitus with complication, without long-term current use of insulin ([place])- her blood sugars are adequately well controlled -     Lipid panel; Future -     Basic metabolic panel; Future -     Hemoglobin A1c; Future -     Urinalysis, Routine w reflex microscopic (not at [facility]); Future -     Microalbumin / creatinine urine ratio; Future -     Blood Glucose Monitoring Suppl (ONETOUCH VERIO FLEX SYSTEM) W/DEVICE KIT; 1 Act by Does not apply route 2 (two) times daily. -     glucose blood (ONE TOUCH TEST STRIPS) test strip; Use BID -     Ambulatory referral to Ophthalmology  Leukocytosis- improvement noted  -     CBC with Differential/Platelet; Future  Pure hypercholesterolemia -     TSH; Future  HYPERTENSION, BENIGN ESSENTIAL- her BP is well controlled, I will treat the hypokalemia -     Lipid panel; Future -     Basic metabolic panel; Future -     TSH; Future -  Urinalysis, Routine w reflex microscopic (not at [facility]); Future -     potassium chloride SA (K-DUR,KLOR-CON) 20 MEQ tablet; Take 1 tablet (20 mEq total) by mouth 3 (three) times daily.  Pernicious anemia- marked improvement noted, her vit levels are all normal, this was apparently related to the crohn's flare -     CBC with Differential/Platelet; Future -     IBC panel; Future -     Vitamin B12; Future -     Folate; Future -     Ferritin; Future  Crohn's disease of both small and large intestine with fistula ([place]) -     Ambulatory referral to Gastroenterology  Hypokalemia -     potassium chloride SA (K-DUR,KLOR-CON) 20 MEQ tablet; Take 1 tablet (20 mEq total) by mouth 3 (three) times daily.  Other orders -     Azilsartan-Chlorthalidone (EDARBYCLOR) 40-25 MG TABS; Take 1 tablet by mouth daily.   I have discontinued Ms. Marney's  oxyCODONE-acetaminophen and amoxicillin-clavulanate. I am also having her start on [place], glucose blood, and potassium chloride SA. Additionally, I am having her maintain her lamoTRIgine, AMITIZA, zolpidem, buPROPion, buPROPion, HYDROcodone-acetaminophen, fluticasone, and Azilsartan-Chlorthalidone.  Meds ordered this encounter  Medications  . buPROPion (WELLBUTRIN XL) 150 MG 24 hr tablet    Sig: Take 150 mg by mouth every morning.    Refill:  2  . HYDROcodone-acetaminophen (NORCO/VICODIN) 5-325 MG tablet    Sig: Take 1 tablet by mouth every 4 (four) hours as needed.    Refill:  0  . fluticasone (FLONASE) 50 MCG/ACT nasal spray    Sig: Place 2 sprays into both nostrils daily as needed for allergies or rhinitis.    Dispense:  16 g    Refill:  11  . Azilsartan-Chlorthalidone (EDARBYCLOR) 40-25 MG TABS    Sig: Take 1 tablet by mouth daily.    Dispense:  90 tablet    Refill:  3  . Blood Glucose Monitoring Suppl (ONETOUCH VERIO FLEX SYSTEM) W/DEVICE KIT    Sig: 1 Act by Does not apply route 2 (two) times daily.    Dispense:  2 kit    Refill:  0  . glucose blood (ONE TOUCH TEST STRIPS) test strip    Sig: Use BID    Dispense:  100 each    Refill:  12  . potassium chloride SA (K-DUR,KLOR-CON) 20 MEQ tablet    Sig: Take 1 tablet (20 mEq total) by mouth 3 (three) times daily.    Dispense:  90 tablet    Refill:  5     Follow-up: Return in about 4 months (around 06/27/2015).  Scarlette Calico, MD

## 2015-02-24 NOTE — Progress Notes (Signed)
Pre visit review using our clinic review tool, if applicable. No additional management support is needed unless otherwise documented below in the visit note. 

## 2015-02-24 NOTE — Telephone Encounter (Signed)
Is requesting results to be mailed to her

## 2015-02-24 NOTE — Patient Instructions (Signed)
Anemia, Nonspecific Anemia is a condition in which the concentration of red blood cells or hemoglobin in the blood is below normal. Hemoglobin is a substance in red blood cells that carries oxygen to the tissues of the body. Anemia results in not enough oxygen reaching these tissues.  CAUSES  Common causes of anemia include:   Excessive bleeding. Bleeding may be internal or external. This includes excessive bleeding from periods (in women) or from the intestine.   Poor nutrition.   Chronic kidney, thyroid, and liver disease.  Bone marrow disorders that decrease red blood cell production.  Cancer and treatments for cancer.  HIV, AIDS, and their treatments.  Spleen problems that increase red blood cell destruction.  Blood disorders.  Excess destruction of red blood cells due to infection, medicines, and autoimmune disorders. SIGNS AND SYMPTOMS   Minor weakness.   Dizziness.   Headache.  Palpitations.   Shortness of breath, especially with exercise.   Paleness.  Cold sensitivity.  Indigestion.  Nausea.  Difficulty sleeping.  Difficulty concentrating. Symptoms may occur suddenly or they may develop slowly.  DIAGNOSIS  Additional blood tests are often needed. These help your health care provider determine the best treatment. Your health care provider will check your stool for blood and look for other causes of blood loss.  TREATMENT  Treatment varies depending on the cause of the anemia. Treatment can include:   Supplements of iron, vitamin B12, or folic acid.   Hormone medicines.   A blood transfusion. This may be needed if blood loss is severe.   Hospitalization. This may be needed if there is significant continual blood loss.   Dietary changes.  Spleen removal. HOME CARE INSTRUCTIONS Keep all follow-up appointments. It often takes many weeks to correct anemia, and having your health care provider check on your condition and your response to  treatment is very important. SEEK IMMEDIATE MEDICAL CARE IF:   You develop extreme weakness, shortness of breath, or chest pain.   You become dizzy or have trouble concentrating.  You develop heavy vaginal bleeding.   You develop a rash.   You have bloody or black, tarry stools.   You faint.   You vomit up blood.   You vomit repeatedly.   You have abdominal pain.  You have a fever or persistent symptoms for more than 2-3 days.   You have a fever and your symptoms suddenly get worse.   You are dehydrated.  MAKE SURE YOU:  Understand these instructions.  Will watch your condition.  Will get help right away if you are not doing well or get worse.   This information is not intended to replace advice given to you by your health care provider. Make sure you discuss any questions you have with your health care provider.   Document Released: 06/07/2004 Document Revised: 12/31/2012 Document Reviewed: 10/24/2012 Elsevier Interactive Patient Education 2016 [employer]  

## 2015-02-24 NOTE — Telephone Encounter (Signed)
As soon as labs are finalized they will be mailed. Pt just had an apt today

## 2015-02-24 NOTE — Telephone Encounter (Signed)
Pt's KCL is critical at 2.8.

## 2015-02-28 ENCOUNTER — Telehealth: Payer: Self-pay | Admitting: Internal Medicine

## 2015-02-28 NOTE — Telephone Encounter (Signed)
Informed pt of mychart message

## 2015-02-28 NOTE — Telephone Encounter (Signed)
Patient is requesting call back with lab results.  Patient states she is worried and would like a call back as soon as possible because she seen results on My Chart.

## 2015-03-07 ENCOUNTER — Telehealth: Payer: Self-pay | Admitting: *Deleted

## 2015-03-07 ENCOUNTER — Encounter (HOSPITAL_COMMUNITY): Payer: Self-pay | Admitting: Cardiology

## 2015-03-07 ENCOUNTER — Emergency Department (HOSPITAL_COMMUNITY)
Admission: EM | Admit: 2015-03-07 | Discharge: 2015-03-07 | Disposition: A | Discharge status: Home / Self Care | Payer: Medicare Other | Attending: Emergency Medicine | Admitting: Emergency Medicine

## 2015-03-07 DIAGNOSIS — Z720 Tobacco use: Secondary | ICD-10-CM | POA: Insufficient documentation

## 2015-03-07 DIAGNOSIS — I959 Hypotension, unspecified: Secondary | ICD-10-CM | POA: Insufficient documentation

## 2015-03-07 DIAGNOSIS — R197 Diarrhea, unspecified: Secondary | ICD-10-CM | POA: Insufficient documentation

## 2015-03-07 DIAGNOSIS — I952 Hypotension due to drugs: Secondary | ICD-10-CM

## 2015-03-07 DIAGNOSIS — F329 Major depressive disorder, single episode, unspecified: Secondary | ICD-10-CM | POA: Insufficient documentation

## 2015-03-07 DIAGNOSIS — Z79899 Other long term (current) drug therapy: Secondary | ICD-10-CM | POA: Insufficient documentation

## 2015-03-07 DIAGNOSIS — E876 Hypokalemia: Secondary | ICD-10-CM

## 2015-03-07 DIAGNOSIS — E119 Type 2 diabetes mellitus without complications: Secondary | ICD-10-CM | POA: Insufficient documentation

## 2015-03-07 DIAGNOSIS — Z9889 Other specified postprocedural states: Secondary | ICD-10-CM

## 2015-03-07 LAB — COMPREHENSIVE METABOLIC PANEL
ALBUMIN: 3.7 g/dL (ref 3.5–5.0)
ALT: 14 U/L (ref 14–54)
AST: 19 U/L (ref 15–41)
Alkaline Phosphatase: 60 U/L (ref 38–126)
Anion gap: 9 (ref 5–15)
BUN: 8 mg/dL (ref 6–20)
CHLORIDE: 103 mmol/L (ref 101–111)
CO2: 26 mmol/L (ref 22–32)
Calcium: 9.6 mg/dL (ref 8.9–10.3)
Creatinine, Ser: 0.83 mg/dL (ref 0.44–1.00)
GFR calc Af Amer: >60 mL/min (ref >60)
Glucose, Bld: 112 mg/dL — ABNORMAL HIGH (ref 65–99)
POTASSIUM: 3 mmol/L — AB (ref 3.5–5.1)
SODIUM: 138 mmol/L (ref 135–145)
Total Bilirubin: 0.4 mg/dL (ref 0.3–1.2)
Total Protein: 7.1 g/dL (ref 6.5–8.1)

## 2015-03-07 LAB — URINALYSIS, ROUTINE W REFLEX MICROSCOPIC
BILIRUBIN URINE: NEGATIVE
GLUCOSE, UA: NEGATIVE mg/dL
HGB URINE DIPSTICK: NEGATIVE
Ketones, ur: NEGATIVE mg/dL
Leukocytes, UA: NEGATIVE
Nitrite: NEGATIVE
PH: 5.5 (ref 5.0–8.0)
Protein, ur: NEGATIVE mg/dL
SPECIFIC GRAVITY, URINE: 1.024 (ref 1.005–1.030)
Urobilinogen, UA: 0.2 mg/dL (ref 0.0–1.0)

## 2015-03-07 LAB — CBC
HCT: 43 % (ref 36.0–46.0)
Hemoglobin: 14 g/dL (ref 12.0–15.0)
MCH: 29.5 pg (ref 26.0–34.0)
MCHC: 32.6 g/dL (ref 30.0–36.0)
MCV: 90.5 fL (ref 78.0–100.0)
Platelets: 344 10*3/uL (ref 150–400)
RBC: 4.75 MIL/uL (ref 3.87–5.11)
RDW: 14.3 % (ref 11.5–15.5)
WBC: 10.5 10*3/uL (ref 4.0–10.5)

## 2015-03-07 LAB — LIPASE, BLOOD: LIPASE: 54 U/L — AB (ref 11–51)

## 2015-03-07 MED ORDER — POTASSIUM CHLORIDE 10 MEQ/100ML IV SOLN
10.0000 meq | Freq: Once | INTRAVENOUS | Status: AC
  Administered 2015-03-07: 10 meq via INTRAVENOUS
  Filled 2015-03-07: qty 100

## 2015-03-07 MED ORDER — ONDANSETRON HCL 4 MG/2ML IJ SOLN
4.0000 mg | Freq: Once | INTRAMUSCULAR | Status: DC
  Filled 2015-03-07: qty 2

## 2015-03-07 MED ORDER — SODIUM CHLORIDE 0.9 % IV BOLUS (SEPSIS)
1000.0000 mL | Freq: Once | INTRAVENOUS | Status: AC
  Administered 2015-03-07: 1000 mL via INTRAVENOUS

## 2015-03-07 NOTE — ED Notes (Signed)
Reports she has had a low potassium and diarrhea multiple times a day. States she has an appt with GI in a couple of weeks.

## 2015-03-07 NOTE — ED Provider Notes (Signed)
CSN: [id number]     Arrival date & time 03/07/15  1325 History   First MD Initiated Contact with Patient 03/07/15 1534     Chief Complaint  Patient presents with  . Diarrhea  . Hypotension  . Abnormal Lab   (Consider location/radiation/quality/duration/timing/severity/associated sxs/prior Treatment) HPI Comments: April May is a 59 year old woman with a history of Crohn's disease s/p bowel resection 11/2014 and hypertension presents with complaints of diarrhea and low potassium. She reports that she was seen by her PCP on 10/13 and her lab work revealed a K of 2.8. She was started on K supplement at that time. She complains that the supplement is causing her increased diarrhea. She has had chronic diarrhea since her admission in July when she had bowel resection. Reports 4-5 watery bowel movements a day depending on what she eats. Says she has a bowel movement as soon as she eats anything. No blood in her stools or melena. Has not followed up with her gastroenterologist since the bowel resection. Denies any fevers, chills, recent antibiotic use or recent travel.   Also reports her BP has been low into the 54S systolic on several occasions. Feels fatigued and dizzy when this happens. She is currently on ARB-thiazide combination pill for hypertension. She reports intentional 40 lb weight loss over the past year.   The history is provided by the patient.    Past Medical History  Diagnosis Date  . Allergy   . Depression   . Hypertension   . Crohn disease ([place])   . Sleep apnea     does not use cpap since weight loss 1 and 1/2 years ago  . Diabetes mellitus type II     borderline, md checks hemaglobin A 1 c at md office    Past Surgical History  Procedure Laterality Date  . Appendectomy    . Small intestine surgery  age 5  . Breast surgery Right 40 yrs ago    benign tumor removed  . Abdominal hysterectomy      1 ovary removed  . Laparotomy N/A 11/24/2014    Procedure: EXPLORATORY  LAPAROTOMY EXTENSIVE LYSIS OF ADHESIONS SAMLL BOWEL RESECTION RIGHT HEMI COLECTOMY WOUND VAC APPLICATION. ;  Surgeon: Donnie Mesa, MD;  Location: [facility];  Service: General;  Laterality: N/A;  . Bowel resection N/A 11/24/2014    Procedure: SMALL BOWEL RESECTION;  Surgeon: Donnie Mesa, MD;  Location: [facility];  Service: General;  Laterality: N/A;  . Lysis of adhesion  11/24/2014    Procedure: LYSIS OF ADHESION (3 HRS);  Surgeon: Donnie Mesa, MD;  Location: [facility];  Service: General;;  . Partial colectomy  11/24/2014    Procedure: RIGHT HEMI COLECTOMY;  Surgeon: Donnie Mesa, MD;  Location: [facility];  Service: General;;  . Application of wound vac  11/24/2014    Procedure: APPLICATION OF WOUND VAC;  Surgeon: Donnie Mesa, MD;  Location: [facility];  Service: General;;   Family History  Problem Relation Age of Onset  . Hypertension Mother   . Arthritis Father   . Alcohol abuse Paternal Uncle   . Diabetes Paternal Grandmother   . Stroke Paternal Grandmother    Social History  Substance Use Topics  . Smoking status: Current Every Day Smoker -- 1.00 packs/day for 35 years    Types: Cigarettes  . Smokeless tobacco: Never Used  . Alcohol Use: No   OB History    No data available     Review of Systems  Constitutional:  Negative for fever, chills and unexpected weight change.  HENT: Negative.   Eyes: Negative.   Respiratory: Negative.   Cardiovascular: Negative.   Gastrointestinal: Positive for diarrhea. Negative for nausea, vomiting, abdominal pain and blood in stool.  Endocrine: Negative.   Genitourinary: Negative.   Musculoskeletal: Negative.   Skin: Negative.   Allergic/Immunologic: Negative.   Neurological: Negative.   Hematological: Negative.   Psychiatric/Behavioral: Negative.     Allergies  Azathioprine; Crestor; Lipitor; and Lisinopril  Home Medications   Prior to Admission medications   Medication Sig Start Date End Date Taking? Authorizing Provider   Azilsartan-Chlorthalidone (EDARBYCLOR) 40-25 MG TABS Take 1 tablet by mouth daily. 02/24/15  Yes Janith Lima, MD  buPROPion (WELLBUTRIN XL) 150 MG 24 hr tablet Take 150 mg by mouth every morning. 01/29/15  Yes Historical Provider, MD  clonazePAM (KLONOPIN) 0.5 MG tablet Take 0.5 mg by mouth at bedtime. 02/15/15  Yes Historical Provider, MD  FLUoxetine (PROZAC) 10 MG capsule Take 10 mg by mouth daily. 02/15/15  Yes Historical Provider, MD  ibuprofen (ADVIL,MOTRIN) 200 MG tablet Take 400 mg by mouth every 6 (six) hours as needed for headache or moderate pain.   Yes Historical Provider, MD  lamoTRIgine (LAMICTAL) 200 MG tablet Take 200 mg by mouth at bedtime.   Yes Historical Provider, MD  potassium chloride SA (K-DUR,KLOR-CON) 20 MEQ tablet Take 1 tablet (20 mEq total) by mouth 3 (three) times daily. 02/24/15  Yes Janith Lima, MD  Blood Glucose Monitoring Suppl (ONETOUCH VERIO FLEX SYSTEM) W/DEVICE KIT 1 Act by Does not apply route 2 (two) times daily. 02/24/15   Janith Lima, MD  fluticasone (FLONASE) 50 MCG/ACT nasal spray Place 2 sprays into both nostrils daily as needed for allergies or rhinitis. 02/24/15   Janith Lima, MD  glucose blood (ONE TOUCH TEST STRIPS) test strip Use BID 02/24/15   Janith Lima, MD   BP 93/55 mmHg  Pulse 60  Temp(Src) 98.1 F (36.7 C) (Oral)  Resp 23  SpO2 99%  LMP 05/14/1988 Physical Exam  Constitutional: She is oriented to person, place, and time. She appears well-developed and well-nourished. No distress.  HENT:  Head: Normocephalic and atraumatic.  Eyes: Conjunctivae and EOM are normal. Pupils are equal, round, and reactive to light.  Neck: Normal range of motion. Neck supple.  Cardiovascular: Normal rate, regular rhythm and normal heart sounds.  Exam reveals no gallop and no friction rub.   No murmur heard. Pulmonary/Chest: Effort normal and breath sounds normal. She has no wheezes. She has no rales.  Abdominal: Soft. Bowel sounds are normal.  She exhibits no distension. There is no tenderness.  Musculoskeletal: Normal range of motion.  Neurological: She is alert and oriented to person, place, and time.  Skin: Skin is warm and dry. No rash noted. No erythema.  Psychiatric: She has a normal mood and affect.    ED Course  Procedures (including critical care time) Labs Review Labs Reviewed  LIPASE, BLOOD - Abnormal; Notable for the following:    Lipase 54 (*)    All other components within normal limits  COMPREHENSIVE METABOLIC PANEL - Abnormal; Notable for the following:    Potassium 3.0 (*)    Glucose, Bld 112 (*)    All other components within normal limits  CBC  URINALYSIS, ROUTINE W REFLEX MICROSCOPIC (NOT AT [facility])    Imaging Review No results found. I have personally reviewed and evaluated these images and lab results as part of my  medical decision-making.   EKG Interpretation None      MDM   Final diagnoses:  None   Patient with hypokalemia to 2.8 at PCP visit on 10/13. Has been taking KCl 20 mg tid since that time. Reports the pills make her chronic diarrhea worse. Decreased appetite over the past several weeks as well. K today is 3.0. EKG with no abnormalities. Will give her K IV. Resume her KCl PO as prescribed by her PCP.  She has had chronic diarrhea since her bowel resection in July. No blood in her stools or any melena. She has not followed up with her GI doctor. Diarrhea is likely 2/2 decreased absorption following the bowel resection. CBC and BMP with no abnormalities. Will need to follow up with GI.   BP was 93/55. She reports that her blood pressure usually runs a little low in the 903O systolic but she has had occasions where it drops into the 90s and she becomes dizzy and fatigued. She is on ARB-thiazide combination pill for HTN. Reports 40 lb intentional weight loss in the past year. Likely requires less aggressive HTN management following the weight loss. BP improved following 1L NS bolus. Will  have her stop her antihypertensive therapy and call her PCP for further management.     Maryellen Pile, MD 03/07/15 1499  Leonard Schwartz, MD 03/07/15 [phone]

## 2015-03-07 NOTE — Telephone Encounter (Signed)
I have asked her to f/up with her GI doctor about this

## 2015-03-07 NOTE — ED Notes (Signed)
Pt placed on monitor upon arrival to room. Pt ambulate to the restroom with stand by assistance. Pt remains monitored by blood pressure, pulse ox, and 12 lead.

## 2015-03-07 NOTE — ED Notes (Signed)
Patient undressed, in gown, on monitor, continuous pulse oximetry and blood pressure cuff 

## 2015-03-07 NOTE — Discharge Instructions (Signed)
Please continue to take your potassium supplement as prescribed by your PCP.  Stop taking your BP medication tomorrow and call your PCP for further instructions about what to take.  Call your GI doctor for follow up.   Hypokalemia Hypokalemia means that the amount of potassium in the blood is lower than normal.Potassium is a chemical, called an electrolyte, that helps regulate the amount of fluid in the body. It also stimulates muscle contraction and helps nerves function properly.Most of the body's potassium is inside of cells, and only a very small amount is in the blood. Because the amount in the blood is so small, minor changes can be life-threatening. CAUSES  Antibiotics.  Diarrhea or vomiting.  Using laxatives too much, which can cause diarrhea.  Chronic kidney disease.  Water pills (diuretics).  Eating disorders (bulimia).  Low magnesium level.  Sweating a lot. SIGNS AND SYMPTOMS  Weakness.  Constipation.  Fatigue.  Muscle cramps.  Mental confusion.  Skipped heartbeats or irregular heartbeat (palpitations).  Tingling or numbness. DIAGNOSIS  Your health care provider can diagnose hypokalemia with blood tests. In addition to checking your potassium level, your health care provider may also check other lab tests. TREATMENT Hypokalemia can be treated with potassium supplements taken by mouth or adjustments in your current medicines. If your potassium level is very low, you may need to get potassium through a vein (IV) and be monitored in the hospital. A diet high in potassium is also helpful. Foods high in potassium are:  Nuts, such as peanuts and pistachios.  Seeds, such as sunflower seeds and pumpkin seeds.  Peas, lentils, and lima beans.  Whole grain and bran cereals and breads.  Fresh fruit and vegetables, such as apricots, avocado, bananas, cantaloupe, kiwi, oranges, tomatoes, asparagus, and potatoes.  Orange and tomato juices.  Red meats.  Fruit  yogurt. HOME CARE INSTRUCTIONS  Take all medicines as prescribed by your health care provider.  Maintain a healthy diet by including nutritious food, such as fruits, vegetables, nuts, whole grains, and lean meats.  If you are taking a laxative, be sure to follow the directions on the label. SEEK MEDICAL CARE IF:  Your weakness gets worse.  You feel your heart pounding or racing.  You are vomiting or having diarrhea.  You are diabetic and having trouble keeping your blood glucose in the normal range. SEEK IMMEDIATE MEDICAL CARE IF:  You have chest pain, shortness of breath, or dizziness.  You are vomiting or having diarrhea for more than 2 days.  You faint. MAKE SURE YOU:   Understand these instructions.  Will watch your condition.  Will get help right away if you are not doing well or get worse.   This information is not intended to replace advice given to you by your health care provider. Make sure you discuss any questions you have with your health care provider.   Document Released: 04/30/2005 Document Revised: 05/21/2014 Document Reviewed: 10/31/2012 Elsevier Interactive Patient Education [employer].

## 2015-03-07 NOTE — Telephone Encounter (Signed)
Receive call pt states she sent md email when she saw him she fail to mention since her surgery back in July she has been having a lot of diarrhea. After starting the Klor con its even worse. The anti-diarrhea otc is not helping. Requesting md advisement...April May

## 2015-03-07 NOTE — ED Notes (Signed)
Warm blanket given

## 2015-03-07 NOTE — Telephone Encounter (Signed)
Called pt no answer LMOM with msd response...Johny Chess

## 2015-04-19 ENCOUNTER — Emergency Department (HOSPITAL_COMMUNITY)
Admission: EM | Admit: 2015-04-19 | Discharge: 2015-04-20 | Disposition: A | Discharge status: Home / Self Care | Payer: Medicare Other | Attending: Emergency Medicine | Admitting: Emergency Medicine

## 2015-04-19 ENCOUNTER — Encounter (HOSPITAL_COMMUNITY): Payer: Self-pay | Admitting: Adult Health

## 2015-04-19 DIAGNOSIS — M546 Pain in thoracic spine: Secondary | ICD-10-CM | POA: Diagnosis present

## 2015-04-19 DIAGNOSIS — Z79899 Other long term (current) drug therapy: Secondary | ICD-10-CM | POA: Diagnosis not present

## 2015-04-19 DIAGNOSIS — Z8669 Personal history of other diseases of the nervous system and sense organs: Secondary | ICD-10-CM | POA: Insufficient documentation

## 2015-04-19 DIAGNOSIS — E876 Hypokalemia: Secondary | ICD-10-CM | POA: Diagnosis not present

## 2015-04-19 DIAGNOSIS — F329 Major depressive disorder, single episode, unspecified: Secondary | ICD-10-CM | POA: Insufficient documentation

## 2015-04-19 DIAGNOSIS — F17210 Nicotine dependence, cigarettes, uncomplicated: Secondary | ICD-10-CM | POA: Insufficient documentation

## 2015-04-19 DIAGNOSIS — I10 Essential (primary) hypertension: Secondary | ICD-10-CM | POA: Diagnosis not present

## 2015-04-19 DIAGNOSIS — Z8719 Personal history of other diseases of the digestive system: Secondary | ICD-10-CM | POA: Insufficient documentation

## 2015-04-19 LAB — CBC
HCT: 41 % (ref 36.0–46.0)
Hemoglobin: 13.5 g/dL (ref 12.0–15.0)
MCH: 30.5 pg (ref 26.0–34.0)
MCHC: 32.9 g/dL (ref 30.0–36.0)
MCV: 92.8 fL (ref 78.0–100.0)
PLATELETS: 320 10*3/uL (ref 150–400)
RBC: 4.42 MIL/uL (ref 3.87–5.11)
RDW: 15.9 % — ABNORMAL HIGH (ref 11.5–15.5)
WBC: 15.7 10*3/uL — AB (ref 4.0–10.5)

## 2015-04-19 LAB — BASIC METABOLIC PANEL
ANION GAP: 11 (ref 5–15)
BUN: 8 mg/dL (ref 6–20)
CALCIUM: 9.6 mg/dL (ref 8.9–10.3)
CO2: 32 mmol/L (ref 22–32)
CREATININE: 0.77 mg/dL (ref 0.44–1.00)
Chloride: 97 mmol/L — ABNORMAL LOW (ref 101–111)
Glucose, Bld: 102 mg/dL — ABNORMAL HIGH (ref 65–99)
Potassium: 2.4 mmol/L — CL (ref 3.5–5.1)
SODIUM: 140 mmol/L (ref 135–145)

## 2015-04-19 MED ORDER — MORPHINE SULFATE (PF) 4 MG/ML IV SOLN
4.0000 mg | Freq: Once | INTRAVENOUS | Status: AC
  Administered 2015-04-20: 4 mg via INTRAVENOUS
  Filled 2015-04-19: qty 1

## 2015-04-19 MED ORDER — POTASSIUM CHLORIDE 10 MEQ/100ML IV SOLN
10.0000 meq | Freq: Once | INTRAVENOUS | Status: AC
  Administered 2015-04-20: 10 meq via INTRAVENOUS
  Filled 2015-04-19: qty 100

## 2015-04-19 MED ORDER — POTASSIUM CHLORIDE CRYS ER 20 MEQ PO TBCR
40.0000 meq | EXTENDED_RELEASE_TABLET | Freq: Once | ORAL | Status: AC
  Administered 2015-04-20: 40 meq via ORAL
  Filled 2015-04-19: qty 2

## 2015-04-19 NOTE — ED Notes (Signed)
Presents with neck and right and left shoulder blade pain began today and is worse with movement assocaited with fatigue. Pt reports that she has felt similair to this when her potassium is very low and last time they gave 10 bags of potassium-states he has a hard time staying hydrated due to Crohns. Pain is described as pressure and constant.

## 2015-04-20 MED ORDER — POTASSIUM CHLORIDE 10 MEQ/100ML IV SOLN
10.0000 meq | Freq: Once | INTRAVENOUS | Status: AC
Start: 2015-04-20 — End: 2015-04-20
  Administered 2015-04-20: 10 meq via INTRAVENOUS
  Filled 2015-04-20: qty 100

## 2015-04-20 MED ORDER — POTASSIUM CHLORIDE 10 MEQ/100ML IV SOLN
10.0000 meq | Freq: Once | INTRAVENOUS | Status: AC
  Administered 2015-04-20: 10 meq via INTRAVENOUS
  Filled 2015-04-20: qty 100

## 2015-04-20 NOTE — Discharge Instructions (Signed)
Hypokalemia °Hypokalemia means that the amount of potassium in the blood is lower than normal. Potassium is a chemical, called an electrolyte, that helps regulate the amount of fluid in the body. It also stimulates muscle contraction and helps nerves function properly. Most of the body's potassium is inside of cells, and only a very small amount is in the blood. Because the amount in the blood is so small, minor changes can be life-threatening. °CAUSES °· Antibiotics. °· Diarrhea or vomiting. °· Using laxatives too much, which can cause diarrhea. °· Chronic kidney disease. °· Water pills (diuretics). °· Eating disorders (bulimia). °· Low magnesium level. °· Sweating a lot. °SIGNS AND SYMPTOMS °· Weakness. °· Constipation. °· Fatigue. °· Muscle cramps. °· Mental confusion. °· Skipped heartbeats or irregular heartbeat (palpitations). °· Tingling or numbness. °DIAGNOSIS  °Your health care provider can diagnose hypokalemia with blood tests. In addition to checking your potassium level, your health care provider may also check other lab tests. °TREATMENT °Hypokalemia can be treated with potassium supplements taken by mouth or adjustments in your current medicines. If your potassium level is very low, you may need to get potassium through a vein (IV) and be monitored in the hospital. A diet high in potassium is also helpful. Foods high in potassium are: °· Nuts, such as peanuts and pistachios. °· Seeds, such as sunflower seeds and pumpkin seeds. °· Peas, lentils, and lima beans. °· Whole grain and bran cereals and breads. °· Fresh fruit and vegetables, such as apricots, avocado, bananas, cantaloupe, kiwi, oranges, tomatoes, asparagus, and potatoes. °· Orange and tomato juices. °· Red meats. °· Fruit yogurt. °HOME CARE INSTRUCTIONS °· Take all medicines as prescribed by your health care provider. °· Maintain a healthy diet by including nutritious food, such as fruits, vegetables, nuts, whole grains, and lean meats. °· If  you are taking a laxative, be sure to follow the directions on the label. °SEEK MEDICAL CARE IF: °· Your weakness gets worse. °· You feel your heart pounding or racing. °· You are vomiting or having diarrhea. °· You are diabetic and having trouble keeping your blood glucose in the normal range. °SEEK IMMEDIATE MEDICAL CARE IF: °· You have chest pain, shortness of breath, or dizziness. °· You are vomiting or having diarrhea for more than 2 days. °· You faint. °MAKE SURE YOU:  °· Understand these instructions. °· Will watch your condition. °· Will get help right away if you are not doing well or get worse. °  °This information is not intended to replace advice given to you by your health care provider. Make sure you discuss any questions you have with your health care provider. °  °Document Released: 04/30/2005 Document Revised: 05/21/2014 Document Reviewed: 10/31/2012 °Elsevier Interactive Patient Education ©2016 [employer] ° °Potassium Content of Foods °Potassium is a mineral found in many foods and drinks. It helps keep fluids and minerals balanced in your body and affects how steadily your heart beats. Potassium also helps control your blood pressure and keep your muscles and nervous system healthy. °Certain health conditions and medicines may change the balance of potassium in your body. When this happens, you can help balance your level of potassium through the foods that you do or do not eat. Your health care provider or dietitian may recommend an amount of potassium that you should have each day. The following lists of foods provide the amount of potassium (in parentheses) per serving in each item. °HIGH IN POTASSIUM  °The following foods and beverages have 200 mg   or more of potassium per serving: °· Apricots, 2 raw or 5 dry (200 mg). °· Artichoke, 1 medium (345 mg). °· Avocado, raw,  ¼ each (245 mg). °· Banana, 1 medium (425 mg). °· Beans, lima, or baked beans, canned, ½ cup (280 mg). °· Beans, white,  canned, ½ cup (595 mg). °· Beef roast, 3 oz (320 mg). °· Beef, ground, 3 oz (270 mg). °· Beets, raw or cooked, ½ cup (260 mg). °· Bran muffin, 2 oz (300 mg). °· Broccoli, ½ cup (230 mg). °· Brussels sprouts, ½ cup (250 mg). °· Cantaloupe, ½ cup (215 mg). °· Cereal, 100% bran, ½ cup (200-400 mg). °· Cheeseburger, single, fast food, 1 each (225-400 mg). °· Chicken, 3 oz (220 mg). °· Clams, canned, 3 oz (535 mg). °· Crab, 3 oz (225 mg). °· Dates, 5 each (270 mg). °· Dried beans and peas, ½ cup (300-475 mg). °· Figs, dried, 2 each (260 mg). °· Fish: halibut, tuna, cod, snapper, 3 oz (480 mg). °· Fish: salmon, haddock, swordfish, perch, 3 oz (300 mg). °· Fish, tuna, canned 3 oz (200 mg). °· French fries, fast food, 3 oz (470 mg). °· Granola with fruit and nuts, ½ cup (200 mg). °· Grapefruit juice, ½ cup (200 mg). °· Greens, beet, ½ cup (655 mg). °· Honeydew melon, ½ cup (200 mg). °· Kale, raw, 1 cup (300 mg). °· Kiwi, 1 medium (240 mg). °· Kohlrabi, rutabaga, parsnips, ½ cup (280 mg). °· Lentils, ½ cup (365 mg). °· Mango, 1 each (325 mg). °· Milk, chocolate, 1 cup (420 mg). °· Milk: nonfat, low-fat, whole, buttermilk, 1 cup (350-380 mg). °· Molasses, 1 Tbsp (295 mg). °· Mushrooms, ½ cup (280) mg. °· Nectarine, 1 each (275 mg). °· Nuts: almonds, peanuts, hazelnuts, [place], cashew, mixed, 1 oz (200 mg). °· Nuts, pistachios, 1 oz (295 mg). °· Orange, 1 each (240 mg). °· Orange juice, ½ cup (235 mg). °· Papaya, medium, ½ fruit (390 mg). °· Peanut butter, chunky, 2 Tbsp (240 mg). °· Peanut butter, smooth, 2 Tbsp (210 mg). °· Pear, 1 medium (200 mg). °· Pomegranate, 1 whole (400 mg). °· Pomegranate juice, ½ cup (215 mg). °· Pork, 3 oz (350 mg). °· Potato chips, salted, 1 oz (465 mg). °· Potato, baked with skin, 1 medium (925 mg). °· Potatoes, boiled, ½ cup (255 mg). °· Potatoes, mashed, ½ cup (330 mg). °· Prune juice, ½ cup (370 mg). °· Prunes, 5 each (305 mg). °· Pudding, chocolate, ½ cup (230 mg). °· Pumpkin, canned, ½ cup  (250 mg). °· Raisins, seedless, ¼ cup (270 mg). °· Seeds, sunflower or pumpkin, 1 oz (240 mg). °· Soy milk, 1 cup (300 mg). °· Spinach, ½ cup (420 mg). °· Spinach, canned, ½ cup (370 mg). °· Sweet potato, baked with skin, 1 medium (450 mg). °· Swiss chard, ½ cup (480 mg). °· Tomato or vegetable juice, ½ cup (275 mg). °· Tomato sauce or puree, ½ cup (400-550 mg). °· Tomato, raw, 1 medium (290 mg). °· Tomatoes, canned, ½ cup (200-300 mg). °· Turkey, 3 oz (250 mg). °· Wheat germ, 1 oz (250 mg). °· Winter squash, ½ cup (250 mg). °· Yogurt, plain or fruited, 6 oz (260-435 mg). °· Zucchini, ½ cup (220 mg). °MODERATE IN POTASSIUM °The following foods and beverages have 50-200 mg of potassium per serving: °· Apple, 1 each (150 mg). °· Apple juice, ½ cup (150 mg). °· Applesauce, ½ cup (90 mg). °· Apricot nectar, ½ cup (140 mg). °· Asparagus, small   spears, ½ cup or 6 spears (155 mg). °· Bagel, cinnamon raisin, 1 each (130 mg). °· Bagel, egg or plain, 4 in., 1 each (70 mg). °· Beans, green, ½ cup (90 mg). °· Beans, yellow, ½ cup (190 mg). °· Beer, regular, 12 oz (100 mg). °· Beets, canned, ½ cup (125 mg). °· Blackberries, ½ cup (115 mg). °· Blueberries, ½ cup (60 mg). °· Bread, whole wheat, 1 slice (70 mg). °· Broccoli, raw, ½ cup (145 mg). °· Cabbage, ½ cup (150 mg). °· Carrots, cooked or raw, ½ cup (180 mg). °· Cauliflower, raw, ½ cup (150 mg). °· Celery, raw, ½ cup (155 mg). °· Cereal, bran flakes, ½cup (120-150 mg). °· Cheese, cottage, ½ cup (110 mg). °· Cherries, 10 each (150 mg). °· Chocolate, 1½ oz bar (165 mg). °· Coffee, brewed 6 oz (90 mg). °· Corn, ½ cup or 1 ear (195 mg). °· Cucumbers, ½ cup (80 mg). °· Egg, large, 1 each (60 mg). °· Eggplant, ½ cup (60 mg). °· Endive, raw, ½cup (80 mg). °· English muffin, 1 each (65 mg). °· Fish, orange roughy, 3 oz (150 mg). °· Frankfurter, beef or pork, 1 each (75 mg). °· Fruit cocktail, ½ cup (115 mg). °· Grape juice, ½ cup (170 mg). °· Grapefruit, ½ fruit (175 mg). °· Grapes,  ½ cup (155 mg). °· Greens: kale, turnip, collard, ½ cup (110-150 mg). °· Ice cream or frozen yogurt, chocolate, ½ cup (175 mg). °· Ice cream or frozen yogurt, vanilla, ½ cup (120-150 mg). °· Lemons, limes, 1 each (80 mg). °· Lettuce, all types, 1 cup (100 mg). °· Mixed vegetables, ½ cup (150 mg). °· Mushrooms, raw, ½ cup (110 mg). °· Nuts: walnuts, pecans, or macadamia, 1 oz (125 mg). °· Oatmeal, ½ cup (80 mg). °· Okra, ½ cup (110 mg). °· Onions, raw, ½ cup (120 mg). °· Peach, 1 each (185 mg). °· Peaches, canned, ½ cup (120 mg). °· Pears, canned, ½ cup (120 mg). °· Peas, green, frozen, ½ cup (90 mg). °· Peppers, green, ½ cup (130 mg). °· Peppers, red, ½ cup (160 mg). °· Pineapple juice, ½ cup (165 mg). °· Pineapple, fresh or canned, ½ cup (100 mg). °· Plums, 1 each (105 mg). °· Pudding, vanilla, ½ cup (150 mg). °· Raspberries, ½ cup (90 mg). °· Rhubarb, ½ cup (115 mg). °· Rice, wild, ½ cup (80 mg). °· Shrimp, 3 oz (155 mg). °· Spinach, raw, 1 cup (170 mg). °· Strawberries, ½ cup (125 mg). °· Summer squash ½ cup (175-200 mg). °· Swiss chard, raw, 1 cup (135 mg). °· Tangerines, 1 each (140 mg). °· Tea, brewed, 6 oz (65 mg). °· Turnips, ½ cup (140 mg). °· Watermelon, ½ cup (85 mg). °· Wine, red, table, 5 oz (180 mg). °· Wine, white, table, 5 oz (100 mg). °LOW IN POTASSIUM °The following foods and beverages have less than 50 mg of potassium per serving. °· Bread, white, 1 slice (30 mg). °· Carbonated beverages, 12 oz (less than 5 mg). °· Cheese, 1 oz (20-30 mg). °· Cranberries, ½ cup (45 mg). °· Cranberry juice cocktail, ½ cup (20 mg). °· Fats and oils, 1 Tbsp (less than 5 mg). °· Hummus, 1 Tbsp (32 mg). °· Nectar: papaya, mango, or pear, ½ cup (35 mg). °· Rice, white or brown, ½ cup (50 mg). °· Spaghetti or macaroni, ½ cup cooked (30 mg). °· Tortilla, flour or corn, 1 each (50 mg). °· Waffle, 4 in., 1 each (50 mg). °· Water   chestnuts, ½ cup (40 mg). °  °This information is not intended to replace advice given to you by  your health care provider. Make sure you discuss any questions you have with your health care provider. °  °Document Released: 12/12/2004 Document Revised: 05/05/2013 Document Reviewed: 03/27/2013 °Elsevier Interactive Patient Education ©2016 [employer]. ° °

## 2015-04-23 NOTE — ED Provider Notes (Signed)
CSN: [id number]     Arrival date & time 04/19/15  2145 History   First MD Initiated Contact with Patient 04/19/15 2304     Chief Complaint  Patient presents with  . Back Pain     (Consider location/radiation/quality/duration/timing/severity/associated sxs/prior Treatment) Patient is a 59 y.o. female presenting with back pain. The history is provided by the patient. No language interpreter was used.  Back Pain Radiates to:  Does not radiate Pain severity:  Moderate Associated symptoms: no chest pain, no fever, no headaches and no numbness   Associated symptoms comment:  Pain in upper back in between shoulder blades described as aching, worse with movement and associated with fatigue. She has a history of Crohn's Disease requiring bowel resection and reports constant diarrhea. She has had symptoms of upper back pain in the past when her potassium was low. No vomiting or fever. She is not aware of any injury to the back. No SOB or cough.    Past Medical History  Diagnosis Date  . Allergy   . Depression   . Hypertension   . Crohn disease ([place])   . Sleep apnea     does not use cpap since weight loss 1 and 1/2 years ago  . Diabetes mellitus type II     borderline, md checks hemaglobin A 1 c at md office    Past Surgical History  Procedure Laterality Date  . Appendectomy    . Small intestine surgery  age 12  . Breast surgery Right 40 yrs ago    benign tumor removed  . Abdominal hysterectomy      1 ovary removed  . Laparotomy N/A 11/24/2014    Procedure: EXPLORATORY LAPAROTOMY EXTENSIVE LYSIS OF ADHESIONS SAMLL BOWEL RESECTION RIGHT HEMI COLECTOMY WOUND VAC APPLICATION. ;  Surgeon: Donnie Mesa, MD;  Location: [facility];  Service: General;  Laterality: N/A;  . Bowel resection N/A 11/24/2014    Procedure: SMALL BOWEL RESECTION;  Surgeon: Donnie Mesa, MD;  Location: [facility];  Service: General;  Laterality: N/A;  . Lysis of adhesion  11/24/2014    Procedure: LYSIS OF ADHESION (3 HRS);   Surgeon: Donnie Mesa, MD;  Location: [facility];  Service: General;;  . Partial colectomy  11/24/2014    Procedure: RIGHT HEMI COLECTOMY;  Surgeon: Donnie Mesa, MD;  Location: [facility];  Service: General;;  . Application of wound vac  11/24/2014    Procedure: APPLICATION OF WOUND VAC;  Surgeon: Donnie Mesa, MD;  Location: [facility];  Service: General;;   Family History  Problem Relation Age of Onset  . Hypertension Mother   . Arthritis Father   . Alcohol abuse Paternal Uncle   . Diabetes Paternal Grandmother   . Stroke Paternal Grandmother    Social History  Substance Use Topics  . Smoking status: Current Every Day Smoker -- 1.00 packs/day for 35 years    Types: Cigarettes  . Smokeless tobacco: Never Used  . Alcohol Use: No   OB History    No data available     Review of Systems  Constitutional: Negative for fever and chills.  Respiratory: Negative.  Negative for cough and shortness of breath.   Cardiovascular: Negative.  Negative for chest pain.  Gastrointestinal: Negative.  Negative for nausea.  Musculoskeletal: Positive for back pain.  Skin: Negative.   Neurological: Negative.  Negative for numbness and headaches.      Allergies  Azathioprine; Crestor; Lipitor; and Lisinopril  Home Medications   Prior to Admission medications  Medication Sig Start Date End Date Taking? Authorizing Provider  buPROPion (WELLBUTRIN XL) 150 MG 24 hr tablet Take 150 mg by mouth every morning. 01/29/15  Yes Historical Provider, MD  clonazePAM (KLONOPIN) 0.5 MG tablet Take 0.5 mg by mouth at bedtime. 02/15/15  Yes Historical Provider, MD  FLUoxetine (PROZAC) 10 MG capsule Take 10 mg by mouth daily. 02/15/15  Yes Historical Provider, MD  fluticasone (FLONASE) 50 MCG/ACT nasal spray Place 2 sprays into both nostrils daily as needed for allergies or rhinitis. 02/24/15  Yes Janith Lima, MD  ibuprofen (ADVIL,MOTRIN) 200 MG tablet Take 400 mg by mouth every 6 (six) hours as needed for headache or  moderate pain.   Yes Historical Provider, MD  lamoTRIgine (LAMICTAL) 200 MG tablet Take 200 mg by mouth at bedtime.   Yes Historical Provider, MD  Blood Glucose Monitoring Suppl (ONETOUCH VERIO FLEX SYSTEM) W/DEVICE KIT 1 Act by Does not apply route 2 (two) times daily. 02/24/15   Janith Lima, MD  glucose blood (ONE TOUCH TEST STRIPS) test strip Use BID 02/24/15   Janith Lima, MD  potassium chloride SA (K-DUR,KLOR-CON) 20 MEQ tablet Take 1 tablet (20 mEq total) by mouth 3 (three) times daily. Patient not taking: Reported on 04/19/2015 02/24/15   Janith Lima, MD   BP 112/65 mmHg  Pulse 66  Temp(Src) 98.6 F (37 C) (Oral)  Resp 18  SpO2 97%  LMP 05/14/1988 Physical Exam  Constitutional: She is oriented to person, place, and time. She appears well-developed and well-nourished.  HENT:  Head: Normocephalic.  Neck: Normal range of motion. Neck supple.  Cardiovascular: Normal rate and regular rhythm.   Pulmonary/Chest: Effort normal and breath sounds normal.  Abdominal: Soft. Bowel sounds are normal. There is no tenderness. There is no rebound and no guarding.  Musculoskeletal: Normal range of motion.  No upper back tenderness, swelling or discoloration. FROM UE's. No midline or paracervical tenderness.   Neurological: She is alert and oriented to person, place, and time.  Skin: Skin is warm and dry. No rash noted.  Psychiatric: She has a normal mood and affect.    ED Course  Procedures (including critical care time) Labs Review Labs Reviewed  BASIC METABOLIC PANEL - Abnormal; Notable for the following:    Potassium 2.4 (*)    Chloride 97 (*)    Glucose, Bld 102 (*)    All other components within normal limits  CBC - Abnormal; Notable for the following:    WBC 15.7 (*)    RDW 15.9 (*)    All other components within normal limits    Imaging Review No results found. I have personally reviewed and evaluated these images and lab results as part of my medical  decision-making.   EKG Interpretation   Date/Time:  Tuesday April 19 2015 22:11:35 EST Ventricular Rate:  78 PR Interval:  186 QRS Duration: 82 QT Interval:  368 QTC Calculation: 419 R Axis:   -4 Text Interpretation:  Normal sinus rhythm Inferior infarct , age  undetermined Abnormal ECG ED PHYSICIAN INTERPRETATION AVAILABLE IN [place]  [place] Confirmed by TEST, Record ([id number]) on 04/20/2015 8:04:19 AM      MDM   Final diagnoses:  Hypokalemia    The patient's potassium is found to be significantly low at 2.4. She is well appearing otherwise. Potassium supplementation was provided in the emergency department and the patient is felt well enough for discharge home with outpatient follow up for recheck potassium in 3-4 days.  Charlann Lange, PA-C 30/09/79 [id number]  Delora Fuel, MD 18/20/99 0689

## 2015-08-15 DIAGNOSIS — K50814 Crohn's disease of both small and large intestine with abscess: Secondary | ICD-10-CM | POA: Diagnosis not present

## 2015-08-15 DIAGNOSIS — R197 Diarrhea, unspecified: Secondary | ICD-10-CM | POA: Diagnosis not present

## 2015-10-03 ENCOUNTER — Telehealth: Payer: Self-pay | Admitting: Internal Medicine

## 2015-10-03 ENCOUNTER — Other Ambulatory Visit (INDEPENDENT_AMBULATORY_CARE_PROVIDER_SITE_OTHER): Payer: Medicare Other

## 2015-10-03 ENCOUNTER — Encounter (HOSPITAL_COMMUNITY): Payer: Self-pay | Admitting: Emergency Medicine

## 2015-10-03 ENCOUNTER — Emergency Department (HOSPITAL_COMMUNITY)
Admission: EM | Admit: 2015-10-03 | Discharge: 2015-10-04 | Disposition: A | Discharge status: Home / Self Care | Payer: Medicare Other | Attending: Emergency Medicine | Admitting: Emergency Medicine

## 2015-10-03 ENCOUNTER — Encounter: Payer: Self-pay | Admitting: Internal Medicine

## 2015-10-03 ENCOUNTER — Ambulatory Visit (INDEPENDENT_AMBULATORY_CARE_PROVIDER_SITE_OTHER): Payer: Medicare Other | Admitting: Internal Medicine

## 2015-10-03 VITALS — BP 100/60 | HR 66 | Temp 98.1°F | Ht 63.6 in | Wt 200.0 lb

## 2015-10-03 DIAGNOSIS — D510 Vitamin B12 deficiency anemia due to intrinsic factor deficiency: Secondary | ICD-10-CM | POA: Diagnosis not present

## 2015-10-03 DIAGNOSIS — E118 Type 2 diabetes mellitus with unspecified complications: Secondary | ICD-10-CM

## 2015-10-03 DIAGNOSIS — L84 Corns and callosities: Secondary | ICD-10-CM

## 2015-10-03 DIAGNOSIS — I10 Essential (primary) hypertension: Secondary | ICD-10-CM | POA: Diagnosis not present

## 2015-10-03 DIAGNOSIS — F17210 Nicotine dependence, cigarettes, uncomplicated: Secondary | ICD-10-CM | POA: Insufficient documentation

## 2015-10-03 DIAGNOSIS — E876 Hypokalemia: Secondary | ICD-10-CM

## 2015-10-03 DIAGNOSIS — R799 Abnormal finding of blood chemistry, unspecified: Secondary | ICD-10-CM | POA: Diagnosis present

## 2015-10-03 DIAGNOSIS — Z79899 Other long term (current) drug therapy: Secondary | ICD-10-CM | POA: Insufficient documentation

## 2015-10-03 DIAGNOSIS — E663 Overweight: Secondary | ICD-10-CM | POA: Insufficient documentation

## 2015-10-03 DIAGNOSIS — D72829 Elevated white blood cell count, unspecified: Secondary | ICD-10-CM

## 2015-10-03 DIAGNOSIS — F329 Major depressive disorder, single episode, unspecified: Secondary | ICD-10-CM | POA: Diagnosis not present

## 2015-10-03 DIAGNOSIS — Z8719 Personal history of other diseases of the digestive system: Secondary | ICD-10-CM | POA: Diagnosis not present

## 2015-10-03 DIAGNOSIS — E119 Type 2 diabetes mellitus without complications: Secondary | ICD-10-CM | POA: Insufficient documentation

## 2015-10-03 DIAGNOSIS — Z8669 Personal history of other diseases of the nervous system and sense organs: Secondary | ICD-10-CM | POA: Diagnosis not present

## 2015-10-03 DIAGNOSIS — R0602 Shortness of breath: Secondary | ICD-10-CM | POA: Insufficient documentation

## 2015-10-03 LAB — COMPREHENSIVE METABOLIC PANEL
ALK PHOS: 69 U/L (ref 38–126)
ALT: 17 U/L (ref 14–54)
AST: 24 U/L (ref 15–41)
Albumin: 3.6 g/dL (ref 3.5–5.0)
Anion gap: 9 (ref 5–15)
BILIRUBIN TOTAL: 0.3 mg/dL (ref 0.3–1.2)
BUN: 10 mg/dL (ref 6–20)
CALCIUM: 9.2 mg/dL (ref 8.9–10.3)
CHLORIDE: 101 mmol/L (ref 101–111)
CO2: 31 mmol/L (ref 22–32)
CREATININE: 0.97 mg/dL (ref 0.44–1.00)
GFR calc Af Amer: >60 mL/min (ref >60)
Glucose, Bld: 117 mg/dL — ABNORMAL HIGH (ref 65–99)
Potassium: 2.4 mmol/L — CL (ref 3.5–5.1)
Sodium: 141 mmol/L (ref 135–145)
TOTAL PROTEIN: 7.1 g/dL (ref 6.5–8.1)

## 2015-10-03 LAB — CBC WITH DIFFERENTIAL/PLATELET
BASOS ABS: 0 10*3/uL (ref 0.0–0.1)
Basophils Relative: 0.3 % (ref 0.0–3.0)
Eosinophils Absolute: 0.2 10*3/uL (ref 0.0–0.7)
Eosinophils Relative: 1.7 % (ref 0.0–5.0)
HCT: 40.4 % (ref 36.0–46.0)
Hemoglobin: 13.5 g/dL (ref 12.0–15.0)
LYMPHS ABS: 3.9 10*3/uL (ref 0.7–4.0)
LYMPHS PCT: 34.8 % (ref 12.0–46.0)
MCHC: 33.5 g/dL (ref 30.0–36.0)
MCV: 90.7 fl (ref 78.0–100.0)
MONOS PCT: 8.6 % (ref 3.0–12.0)
Monocytes Absolute: 1 10*3/uL (ref 0.1–1.0)
Neutro Abs: 6.1 10*3/uL (ref 1.4–7.7)
Neutrophils Relative %: 54.6 % (ref 43.0–77.0)
Platelets: 301 10*3/uL (ref 150.0–400.0)
RBC: 4.45 Mil/uL (ref 3.87–5.11)
RDW: 14.4 % (ref 11.5–15.5)
WBC: 11.2 10*3/uL — ABNORMAL HIGH (ref 4.0–10.5)

## 2015-10-03 LAB — CBC
HCT: 40.9 % (ref 36.0–46.0)
HEMOGLOBIN: 13.4 g/dL (ref 12.0–15.0)
MCH: 29.9 pg (ref 26.0–34.0)
MCHC: 32.8 g/dL (ref 30.0–36.0)
MCV: 91.3 fL (ref 78.0–100.0)
PLATELETS: 298 10*3/uL (ref 150–400)
RBC: 4.48 MIL/uL (ref 3.87–5.11)
RDW: 13.9 % (ref 11.5–15.5)
WBC: 12 10*3/uL — AB (ref 4.0–10.5)

## 2015-10-03 LAB — BASIC METABOLIC PANEL
BUN: 12 mg/dL (ref 6–23)
CALCIUM: 9.2 mg/dL (ref 8.4–10.5)
CHLORIDE: 99 meq/L (ref 96–112)
CO2: 35 meq/L — AB (ref 19–32)
CREATININE: 0.86 mg/dL (ref 0.40–1.20)
GFR: 86.58 mL/min (ref >60.00)
GLUCOSE: 91 mg/dL (ref 70–99)
Potassium: 2.8 mEq/L — CL (ref 3.5–5.1)
Sodium: 140 mEq/L (ref 135–145)

## 2015-10-03 LAB — MAGNESIUM: Magnesium: 1.8 mg/dL (ref 1.5–2.5)

## 2015-10-03 LAB — HEMOGLOBIN A1C: Hgb A1c MFr Bld: 6 % (ref 4.6–6.5)

## 2015-10-03 MED ORDER — POTASSIUM CHLORIDE CRYS ER 20 MEQ PO TBCR
40.0000 meq | EXTENDED_RELEASE_TABLET | Freq: Once | ORAL | Status: AC
  Administered 2015-10-04: 40 meq via ORAL
  Filled 2015-10-03: qty 2

## 2015-10-03 MED ORDER — POTASSIUM CHLORIDE 10 MEQ/100ML IV SOLN
10.0000 meq | Freq: Once | INTRAVENOUS | Status: DC
  Filled 2015-10-03: qty 100

## 2015-10-03 MED ORDER — MAGNESIUM SULFATE 2 GM/50ML IV SOLN
2.0000 g | Freq: Once | INTRAVENOUS | Status: AC
  Administered 2015-10-04: 2 g via INTRAVENOUS
  Filled 2015-10-03: qty 50

## 2015-10-03 MED ORDER — POTASSIUM CHLORIDE 10 MEQ/100ML IV SOLN
10.0000 meq | INTRAVENOUS | Status: AC
  Administered 2015-10-04: 10 meq via INTRAVENOUS
  Filled 2015-10-03: qty 100

## 2015-10-03 NOTE — ED Provider Notes (Signed)
CSN: [id number]     Arrival date & time 10/03/15  1936 History  By signing my name below, I, Altamease Oiler, attest that this documentation has been prepared under the direction and in the presence of Merryl Hacker, MD. Electronically Signed: Altamease Oiler, ED Scribe. 10/04/2015. 12:03 AM   Chief Complaint  Patient presents with  . Abnormal Lab   The history is provided by the patient. No language interpreter was used.   April May is a 60 y.o. female with PMHx of DM, HTN, and Crohn's disease who presents to the Emergency Department for evaluation after an abnormal lab result at her PCP's office today. The pt states that she received a call that her potassium level was critically low and that she should come to the ED. She has been prescribed potassium pills in the past but they exacerbated the chronic diarrhea that she has with Crohn's disease so the medication was discontinued. Her blood pressure medication does have a diuretic in it but she and her doctor discussed discontinuing it today. She has been feeling well apart from some mild fatigue.  Pt denies chest pain.   Past Medical History  Diagnosis Date  . Allergy   . Depression   . Hypertension   . Crohn disease ([place])   . Sleep apnea     does not use cpap since weight loss 1 and 1/2 years ago  . Diabetes mellitus type II     borderline, md checks hemaglobin A 1 c at md office    Past Surgical History  Procedure Laterality Date  . Appendectomy    . Small intestine surgery  age 39  . Breast surgery Right 40 yrs ago    benign tumor removed  . Abdominal hysterectomy      1 ovary removed  . Laparotomy N/A 11/24/2014    Procedure: EXPLORATORY LAPAROTOMY EXTENSIVE LYSIS OF ADHESIONS SAMLL BOWEL RESECTION RIGHT HEMI COLECTOMY WOUND VAC APPLICATION. ;  Surgeon: Donnie Mesa, MD;  Location: [facility];  Service: General;  Laterality: N/A;  . Bowel resection N/A 11/24/2014    Procedure: SMALL BOWEL RESECTION;  Surgeon: Donnie Mesa, MD;  Location: [facility];  Service: General;  Laterality: N/A;  . Lysis of adhesion  11/24/2014    Procedure: LYSIS OF ADHESION (3 HRS);  Surgeon: Donnie Mesa, MD;  Location: [facility];  Service: General;;  . Partial colectomy  11/24/2014    Procedure: RIGHT HEMI COLECTOMY;  Surgeon: Donnie Mesa, MD;  Location: [facility];  Service: General;;  . Application of wound vac  11/24/2014    Procedure: APPLICATION OF WOUND VAC;  Surgeon: Donnie Mesa, MD;  Location: [facility];  Service: General;;   Family History  Problem Relation Age of Onset  . Hypertension Mother   . Arthritis Father   . Alcohol abuse Paternal Uncle   . Diabetes Paternal Grandmother   . Stroke Paternal Grandmother    Social History  Substance Use Topics  . Smoking status: Current Every Day Smoker -- 1.00 packs/day for 35 years    Types: Cigarettes  . Smokeless tobacco: Never Used  . Alcohol Use: No   OB History    No data available     Review of Systems  Constitutional: Positive for fatigue.  Respiratory: Positive for shortness of breath.   Cardiovascular: Negative for chest pain.  Gastrointestinal: Negative for nausea, vomiting and abdominal pain.  All other systems reviewed and are negative.     Allergies  Azathioprine; Crestor; Lipitor;  and Lisinopril  Home Medications   Prior to Admission medications   Medication Sig Start Date End Date Taking? Authorizing Provider  buPROPion (WELLBUTRIN SR) 100 MG 12 hr tablet Take 100 mg by mouth daily. 09/17/15  Yes Historical Provider, MD  clonazePAM (KLONOPIN) 0.5 MG tablet Take 0.5 mg by mouth 2 (two) times daily as needed for anxiety.  02/15/15  Yes Historical Provider, MD  fluticasone (FLONASE) 50 MCG/ACT nasal spray Place 2 sprays into both nostrils daily as needed for allergies or rhinitis. 02/24/15  Yes Janith Lima, MD  ibuprofen (ADVIL,MOTRIN) 200 MG tablet Take 400 mg by mouth every 6 (six) hours as needed for headache or moderate pain.   Yes Historical  Provider, MD  lamoTRIgine (LAMICTAL) 200 MG tablet Take 200 mg by mouth at bedtime.   Yes Historical Provider, MD  PREVALITE 4 GM/DOSE powder TAKE 4 GRAMS TWICE A DAY 08/15/15  Yes Historical Provider, MD  potassium chloride SA (K-DUR,KLOR-CON) 20 MEQ tablet Take 1 tablet (20 mEq total) by mouth 3 (three) times daily. 10/04/15   Merryl Hacker, MD   BP 124/73 mmHg  Pulse 65  Temp(Src) 98.3 F (36.8 C) (Oral)  Resp 16  Ht 5' 1"  (1.549 m)  Wt 200 lb (90.719 kg)  BMI 37.81 kg/m2  SpO2 96%  LMP 05/14/1988 Physical Exam  Constitutional: She is oriented to person, place, and time. She appears well-developed and well-nourished.  Overweight  HENT:  Head: Normocephalic and atraumatic.  Cardiovascular: Normal rate, regular rhythm and normal heart sounds.   Pulmonary/Chest: Effort normal and breath sounds normal. No respiratory distress. She has no wheezes.  Abdominal: Soft. There is no tenderness.  Neurological: She is alert and oriented to person, place, and time.  Skin: Skin is warm and dry.  Psychiatric: She has a normal mood and affect.  Nursing note and vitals reviewed.   ED Course  Procedures (including critical care time)  Angiocath insertion Performed by: Merryl Hacker  Consent: Verbal consent obtained. Risks and benefits: risks, benefits and alternatives were discussed Time out: Immediately prior to procedure a "time out" was called to verify the correct patient, procedure, equipment, support staff and site/side marked as required.  Preparation: Patient was prepped and draped in the usual sterile fashion.  Vein Location: Right antecubital  Ultrasound Guided  Gauge: 20  Normal blood return and flush without difficulty Patient tolerance: Patient tolerated the procedure well with no immediate complications.    DIAGNOSTIC STUDIES: Oxygen Saturation is 96% on RA,  normal by my interpretation.    COORDINATION OF CARE: 12:01 AM Discussed treatment plan which  includes lab work, EKG, potassium supplementation, and magnesium supplementation with pt at bedside and pt agreed to plan.  Labs Review Labs Reviewed  COMPREHENSIVE METABOLIC PANEL - Abnormal; Notable for the following:    Potassium 2.4 (*)    Glucose, Bld 117 (*)    All other components within normal limits  CBC - Abnormal; Notable for the following:    WBC 12.0 (*)    All other components within normal limits    Imaging Review No results found. I have personally reviewed and evaluated these lab results as part of my medical decision-making.   EKG Interpretation   Date/Time:  Monday Oct 03 2015 23:53:08 EDT Ventricular Rate:  64 PR Interval:  207 QRS Duration: 101 QT Interval:  419 QTC Calculation: 432 R Axis:   20 Text Interpretation:  Sinus rhythm Borderline prolonged PR interval  Abnormal R-wave progression,  early transition Confirmed by HORTON  MD,  Haslett ([id number]) on 10/04/2015 12:06:26 AM      MDM   Final diagnoses:  Hypokalemia    She presents with isolated hypokalemia and fatigue. History of the same. History of chronic diarrhea secondary to bowel resection and Crohn's. Nontoxic on exam. EKG is reassuring. Potassium 2.4. Patient was given 2 rounds of IV potassium as well as by mouth potassium and magnesium. Patient encouraged to restart potassium regimen at home. Follow-up with primary physician.  After history, exam, and medical workup I feel the patient has been appropriately medically screened and is safe for discharge home. Pertinent diagnoses were discussed with the patient. Patient was given return precautions.  I personally performed the services described in this documentation, which was scribed in my presence. The recorded information has been reviewed and is accurate.   Merryl Hacker, MD 10/04/15 [phone]

## 2015-10-03 NOTE — Patient Instructions (Signed)

## 2015-10-03 NOTE — Telephone Encounter (Signed)
Potassium is critically  Low at 2.8 and she is ,  Already taking meq 3 times daily.  Instructing patient to go to nearest ER for  IV infusions.  They should check her magnesium level as well

## 2015-10-03 NOTE — Telephone Encounter (Signed)
Patient stopped potassium due to diarrhea advised patient she needs to go to ED now for infusions. Patient agreed to go.

## 2015-10-03 NOTE — Progress Notes (Signed)
Pre visit review using our clinic review tool, if applicable. No additional management support is needed unless otherwise documented below in the visit note. 

## 2015-10-03 NOTE — Progress Notes (Signed)
Subjective:  Patient ID: April May, female    DOB: 08-May-1956  Age: 60 y.o. MRN: [id number]  CC: Hypertension   HPI ELMA SHANDS presents for a blood pressure check. Unfortunately, she tells me she has not been taking her potassium supplements because it worsens her diarrhea. She has chronic diarrhea due to inflammatory bowel disease and complaints that she has 3-4 loose stools a day. Her medication list currently does not list any antihypertensives but she thinks that she is taking an ARB plus and hydrochlorothiazide tablet that we do not currently have a record of. She tells me her blood pressures been really well controlled and she denies episodes of dizziness or lightheadedness.  She is also due for follow-up on diabetes. She complains of a callus on the bottom of her left foot. She denies visual disturbance, polyuria, polydipsia, or polyphagia.   Outpatient Prescriptions Prior to Visit  Medication Sig Dispense Refill  . clonazePAM (KLONOPIN) 0.5 MG tablet Take 0.5 mg by mouth 2 (two) times daily as needed for anxiety.   2  . fluticasone (FLONASE) 50 MCG/ACT nasal spray Place 2 sprays into both nostrils daily as needed for allergies or rhinitis. 16 g 11  . ibuprofen (ADVIL,MOTRIN) 200 MG tablet Take 400 mg by mouth every 6 (six) hours as needed for headache or moderate pain.    Marland Kitchen lamoTRIgine (LAMICTAL) 200 MG tablet Take 200 mg by mouth at bedtime.    Marland Kitchen buPROPion (WELLBUTRIN XL) 150 MG 24 hr tablet Take 150 mg by mouth every morning.  2  . FLUoxetine (PROZAC) 10 MG capsule Take 10 mg by mouth daily.  2  . potassium chloride SA (K-DUR,KLOR-CON) 20 MEQ tablet Take 1 tablet (20 mEq total) by mouth 3 (three) times daily. 90 tablet 5  . Blood Glucose Monitoring Suppl (ONETOUCH VERIO FLEX SYSTEM) W/DEVICE KIT 1 Act by Does not apply route 2 (two) times daily. (Patient not taking: Reported on 10/03/2015) 2 kit 0  . glucose blood (ONE TOUCH TEST STRIPS) test strip Use BID (Patient  not taking: Reported on 10/03/2015) 100 each 12   No facility-administered medications prior to visit.    ROS Review of Systems  Constitutional: Negative.  Negative for fever, chills, diaphoresis, appetite change and fatigue.  HENT: Negative.   Eyes: Negative.  Negative for visual disturbance.  Respiratory: Negative.  Negative for cough, choking, chest tightness, shortness of breath and stridor.   Cardiovascular: Negative.  Negative for chest pain, palpitations and leg swelling.  Gastrointestinal: Negative.  Negative for nausea, vomiting, abdominal pain, diarrhea and constipation.  Endocrine: Negative.  Negative for polydipsia, polyphagia and polyuria.  Genitourinary: Negative.  Negative for dysuria, urgency, flank pain, decreased urine volume and difficulty urinating.  Musculoskeletal: Negative.  Negative for myalgias, back pain, joint swelling and arthralgias.  Skin: Negative.  Negative for color change and rash.  Allergic/Immunologic: Negative.   Neurological: Negative.  Negative for dizziness, weakness and light-headedness.  Hematological: Negative.  Negative for adenopathy. Does not bruise/bleed easily.  Psychiatric/Behavioral: Negative.     Objective:  BP 100/60 mmHg  Pulse 66  Temp(Src) 98.1 F (36.7 C) (Oral)  Ht 5' 3.6" (1.615 m)  Wt 200 lb (90.719 kg)  BMI 34.78 kg/m2  SpO2 98%  LMP 05/14/1988  BP Readings from Last 3 Encounters:  10/04/15 122/74  10/03/15 100/60  04/20/15 112/65    Wt Readings from Last 3 Encounters:  10/03/15 200 lb (90.719 kg)  10/03/15 200 lb (90.719 kg)  02/24/15 197  lb (89.359 kg)    Physical Exam  Constitutional: She is oriented to person, place, and time. No distress.  HENT:  Mouth/Throat: Oropharynx is clear and moist. No oropharyngeal exudate.  Eyes: Conjunctivae are normal. Right eye exhibits no discharge. Left eye exhibits no discharge. No scleral icterus.  Neck: Normal range of motion. Neck supple. No JVD present. No tracheal  deviation present. No thyromegaly present.  Cardiovascular: Normal rate, regular rhythm, normal heart sounds and intact distal pulses.  Exam reveals no gallop and no friction rub.   No murmur heard. Pulmonary/Chest: Effort normal and breath sounds normal. No stridor. No respiratory distress. She has no wheezes. She has no rales. She exhibits no tenderness.  Abdominal: Soft. Bowel sounds are normal. She exhibits no distension and no mass. There is no tenderness. There is no rebound and no guarding.  Musculoskeletal: Normal range of motion. She exhibits no edema or tenderness.  Lymphadenopathy:    She has no cervical adenopathy.  Neurological: She is oriented to person, place, and time.  Skin: Skin is warm and dry. No rash noted. She is not diaphoretic. No erythema. No pallor.  Psychiatric: She has a normal mood and affect. Her behavior is normal. Judgment and thought content normal.  Vitals reviewed.   Lab Results  Component Value Date   WBC 12.0* 10/03/2015   HGB 13.4 10/03/2015   HCT 40.9 10/03/2015   PLT 298 10/03/2015   GLUCOSE 117* 10/03/2015   CHOL 118 02/24/2015   TRIG 120.0 02/24/2015   HDL 35.40* 02/24/2015   LDLCALC 58 02/24/2015   ALT 17 10/03/2015   AST 24 10/03/2015   NA 141 10/03/2015   K 2.4* 10/03/2015   CL 101 10/03/2015   CREATININE 0.97 10/03/2015   BUN 10 10/03/2015   CO2 31 10/03/2015   TSH 1.33 02/24/2015   HGBA1C 6.0 10/03/2015   MICROALBUR 1.3 02/24/2015    No results found.  Assessment & Plan:   Judene was seen today for hypertension.  Diagnoses and all orders for this visit:  Leukocytosis- this is a stable finding for her, she is not anemic and all of her other cell lines appear mature and normal, this is most likely related to tobacco abuse, I will continue to follow. -     CBC with Differential/Platelet; Future  Hypokalemia- this is caused by combination of hydrochlorothiazide, which she is taking on her own without my knowledge, as well as  chronic diarrhea. I've asked her to stop taking the hydrochlorothiazide when she returns for follow-up I will switch her to potassium sparing diuretic. Will also restart potassium replacement therapy. -     Basic metabolic panel; Future -     Magnesium; Future  Type 2 diabetes mellitus with complication, without long-term current use of insulin ([place])- her blood pressures adequately well-controlled. -     Basic metabolic panel; Future -     Hemoglobin A1c; Future  HYPERTENSION, BENIGN ESSENTIAL- Her blood pressure is a little too low today, fortunately she is asymptomatic, I've asked her to discontinue taking the blood pressure regimen that she has at home when she returns for blood pressure check I will start her on a potassium sparing agent. -     Basic metabolic panel; Future  Pernicious anemia- improvement noted. -     CBC with Differential/Platelet; Future  I have discontinued Ms. Pebley's [place], glucose blood, and FLUoxetine. I am also having her maintain her lamoTRIgine, fluticasone, ibuprofen, clonazePAM, PREVALITE, and buPROPion.  Meds ordered this encounter  Medications  . PREVALITE 4 GM/DOSE powder    Sig: TAKE 4 GRAMS TWICE A DAY    Refill:  12  . buPROPion (WELLBUTRIN SR) 100 MG 12 hr tablet    Sig: Take 100 mg by mouth daily.    Refill:  2  . DISCONTD: [facility] NTS STEP 1 21 MG/24HR patch    Sig: APPLY 1 PATCH ON DRY SKIN DAILY (DO NOT SMOKE WHILE USING PATCH)    Refill:  0     Follow-up: Return in about 4 months (around 02/03/2016).  Scarlette Calico, MD

## 2015-10-03 NOTE — ED Notes (Signed)
Patient arrives with complaint of low potassium level per her MD. Patient states her doctor told her that her K+ level is 2.0 currently. Denies other complaints aside from fatigue.

## 2015-10-04 ENCOUNTER — Encounter: Payer: Self-pay | Admitting: Internal Medicine

## 2015-10-04 DIAGNOSIS — L84 Corns and callosities: Secondary | ICD-10-CM | POA: Insufficient documentation

## 2015-10-04 MED ORDER — LORAZEPAM 1 MG PO TABS
1.0000 mg | ORAL_TABLET | Freq: Once | ORAL | Status: AC
  Administered 2015-10-04: 1 mg via ORAL
  Filled 2015-10-04: qty 1

## 2015-10-04 MED ORDER — POTASSIUM CHLORIDE CRYS ER 20 MEQ PO TBCR: 20.0000 meq | EXTENDED_RELEASE_TABLET | Freq: Three times a day (TID) | ORAL | Status: DC

## 2015-10-04 NOTE — Discharge Instructions (Signed)

## 2015-10-04 NOTE — Telephone Encounter (Signed)
See note sent by MD on call.

## 2015-10-06 ENCOUNTER — Other Ambulatory Visit: Payer: Self-pay | Admitting: Internal Medicine

## 2015-10-06 ENCOUNTER — Telehealth: Payer: Self-pay

## 2015-10-06 DIAGNOSIS — I10 Essential (primary) hypertension: Secondary | ICD-10-CM

## 2015-10-06 DIAGNOSIS — E118 Type 2 diabetes mellitus with unspecified complications: Secondary | ICD-10-CM

## 2015-10-06 MED ORDER — TELMISARTAN 40 MG PO TABS: 40.0000 mg | ORAL_TABLET | Freq: Every day | ORAL | Status: DC

## 2015-10-06 NOTE — Telephone Encounter (Signed)
Patient called and is very confused about some medications. If she should be taken if something was suppose to be called in. Could you please call her back. Thank you.

## 2015-10-07 NOTE — Telephone Encounter (Signed)
Explained to patient that rx for BP control is telmisartan. Explained the [place] (old BP med) is in the same class as the telmisartan and that the mg is the same.   Pt wanted to know when she needed to come back to have her K+ rechecked via OV or just a lab visit?

## 2015-10-09 NOTE — Telephone Encounter (Signed)
I need to see her and recheck her blood pressure and her potassium level in about 3 weeks

## 2015-10-11 NOTE — Telephone Encounter (Addendum)
Pt contacted and appt for follow up has been made.

## 2015-10-12 ENCOUNTER — Other Ambulatory Visit: Payer: Self-pay | Admitting: Internal Medicine

## 2015-10-12 ENCOUNTER — Encounter: Payer: Self-pay | Admitting: Internal Medicine

## 2015-10-12 DIAGNOSIS — I10 Essential (primary) hypertension: Secondary | ICD-10-CM

## 2015-10-12 DIAGNOSIS — E876 Hypokalemia: Secondary | ICD-10-CM

## 2015-10-12 MED ORDER — SPIRONOLACTONE 25 MG PO TABS: 25.0000 mg | ORAL_TABLET | Freq: Every day | ORAL | Status: DC

## 2015-10-18 ENCOUNTER — Telehealth: Payer: Self-pay | Admitting: Internal Medicine

## 2015-10-18 NOTE — Telephone Encounter (Signed)
[place]  Patient Name: April May  DOB: 28-Nov-1955    Initial Comment having swelling in feet and ankles for past week. dr just changed her to new BP med.    Nurse Assessment      Guidelines    Guideline Title Affirmed Question Affirmed Notes       Final Disposition User   FINAL ATTEMPT MADE - message left [place], RN, Lattie Haw

## 2015-10-19 ENCOUNTER — Emergency Department (HOSPITAL_COMMUNITY): Payer: Medicare Other

## 2015-10-19 ENCOUNTER — Encounter: Payer: Self-pay | Admitting: Internal Medicine

## 2015-10-19 ENCOUNTER — Emergency Department (HOSPITAL_COMMUNITY)
Admission: EM | Admit: 2015-10-19 | Discharge: 2015-10-19 | Disposition: A | Discharge status: Home / Self Care | Payer: Medicare Other | Attending: Emergency Medicine | Admitting: Emergency Medicine

## 2015-10-19 ENCOUNTER — Encounter (HOSPITAL_COMMUNITY): Payer: Self-pay | Admitting: Emergency Medicine

## 2015-10-19 ENCOUNTER — Ambulatory Visit (INDEPENDENT_AMBULATORY_CARE_PROVIDER_SITE_OTHER): Payer: Medicare Other | Admitting: Internal Medicine

## 2015-10-19 VITALS — BP 168/80 | HR 72 | Temp 98.2°F | Resp 20 | Wt 204.0 lb

## 2015-10-19 DIAGNOSIS — M25512 Pain in left shoulder: Secondary | ICD-10-CM | POA: Diagnosis not present

## 2015-10-19 DIAGNOSIS — M7989 Other specified soft tissue disorders: Secondary | ICD-10-CM | POA: Diagnosis not present

## 2015-10-19 DIAGNOSIS — Z791 Long term (current) use of non-steroidal anti-inflammatories (NSAID): Secondary | ICD-10-CM | POA: Diagnosis not present

## 2015-10-19 DIAGNOSIS — Z79899 Other long term (current) drug therapy: Secondary | ICD-10-CM | POA: Diagnosis not present

## 2015-10-19 DIAGNOSIS — E118 Type 2 diabetes mellitus with unspecified complications: Secondary | ICD-10-CM | POA: Diagnosis not present

## 2015-10-19 DIAGNOSIS — F17210 Nicotine dependence, cigarettes, uncomplicated: Secondary | ICD-10-CM | POA: Insufficient documentation

## 2015-10-19 DIAGNOSIS — E119 Type 2 diabetes mellitus without complications: Secondary | ICD-10-CM | POA: Diagnosis not present

## 2015-10-19 DIAGNOSIS — I10 Essential (primary) hypertension: Secondary | ICD-10-CM | POA: Insufficient documentation

## 2015-10-19 LAB — BASIC METABOLIC PANEL
Anion gap: 8 (ref 5–15)
BUN: <5 mg/dL — ABNORMAL LOW (ref 6–20)
CALCIUM: 8.9 mg/dL (ref 8.9–10.3)
CO2: 28 mmol/L (ref 22–32)
CREATININE: 0.74 mg/dL (ref 0.44–1.00)
Chloride: 105 mmol/L (ref 101–111)
Glucose, Bld: 92 mg/dL (ref 65–99)
Potassium: 3 mmol/L — ABNORMAL LOW (ref 3.5–5.1)
SODIUM: 141 mmol/L (ref 135–145)

## 2015-10-19 LAB — I-STAT TROPONIN, ED: Troponin i, poc: 0 ng/mL (ref 0.00–0.08)

## 2015-10-19 LAB — CBC WITH DIFFERENTIAL/PLATELET
Basophils Absolute: 0 K/uL (ref 0.0–0.1)
Basophils Relative: 0 %
Eosinophils Absolute: 0.3 K/uL (ref 0.0–0.7)
Eosinophils Relative: 3 %
HCT: 38.9 % (ref 36.0–46.0)
Hemoglobin: 12.5 g/dL (ref 12.0–15.0)
Lymphocytes Relative: 31 %
Lymphs Abs: 2.9 K/uL (ref 0.7–4.0)
MCH: 29.8 pg (ref 26.0–34.0)
MCHC: 32.1 g/dL (ref 30.0–36.0)
MCV: 92.6 fL (ref 78.0–100.0)
Monocytes Absolute: 0.8 K/uL (ref 0.1–1.0)
Monocytes Relative: 8 %
Neutro Abs: 5.6 K/uL (ref 1.7–7.7)
Neutrophils Relative %: 58 %
Platelets: 290 K/uL (ref 150–400)
RBC: 4.2 MIL/uL (ref 3.87–5.11)
RDW: 15.2 % (ref 11.5–15.5)
WBC: 9.5 K/uL (ref 4.0–10.5)

## 2015-10-19 LAB — TROPONIN I: Troponin I: <0.03 ng/mL

## 2015-10-19 LAB — BRAIN NATRIURETIC PEPTIDE: B NATRIURETIC PEPTIDE 5: 75.5 pg/mL (ref 0.0–100.0)

## 2015-10-19 MED ORDER — TRIAMTERENE-HCTZ 37.5-25 MG PO CAPS: 1.0000 | ORAL_CAPSULE | Freq: Every day | ORAL | Status: DC

## 2015-10-19 MED ORDER — POTASSIUM CHLORIDE CRYS ER 20 MEQ PO TBCR
40.0000 meq | EXTENDED_RELEASE_TABLET | Freq: Once | ORAL | Status: AC
  Administered 2015-10-19: 40 meq via ORAL
  Filled 2015-10-19: qty 2

## 2015-10-19 MED ORDER — AZILSARTAN MEDOXOMIL 80 MG PO TABS: 1.0000 | ORAL_TABLET | Freq: Every day | ORAL | Status: DC

## 2015-10-19 NOTE — Patient Instructions (Signed)
Hypertension Hypertension, commonly called high blood pressure, is when the force of blood pumping through your arteries is too strong. Your arteries are the blood vessels that carry blood from your heart throughout your body. A blood pressure reading consists of a higher number over a lower number, such as 110/72. The higher number (systolic) is the pressure inside your arteries when your heart pumps. The lower number (diastolic) is the pressure inside your arteries when your heart relaxes. Ideally you want your blood pressure below 120/80. Hypertension forces your heart to work harder to pump blood. Your arteries may become narrow or stiff. Having untreated or uncontrolled hypertension can cause heart attack, stroke, kidney disease, and other problems. RISK FACTORS Some risk factors for high blood pressure are controllable. Others are not.  Risk factors you cannot control include:   Race. You may be at higher risk if you are African American.  Age. Risk increases with age.  Gender. Men are at higher risk than women before age 45 years. After age 65, women are at higher risk than men. Risk factors you can control include:  Not getting enough exercise or physical activity.  Being overweight.  Getting too much fat, sugar, calories, or salt in your diet.  Drinking too much alcohol. SIGNS AND SYMPTOMS Hypertension does not usually cause signs or symptoms. Extremely high blood pressure (hypertensive crisis) may cause headache, anxiety, shortness of breath, and nosebleed. DIAGNOSIS To check if you have hypertension, your health care provider will measure your blood pressure while you are seated, with your arm held at the level of your heart. It should be measured at least twice using the same arm. Certain conditions can cause a difference in blood pressure between your right and left arms. A blood pressure reading that is higher than normal on one occasion does not mean that you need treatment. If  it is not clear whether you have high blood pressure, you may be asked to return on a different day to have your blood pressure checked again. Or, you may be asked to monitor your blood pressure at home for 1 or more weeks. TREATMENT Treating high blood pressure includes making lifestyle changes and possibly taking medicine. Living a healthy lifestyle can help lower high blood pressure. You may need to change some of your habits. Lifestyle changes may include:  Following the DASH diet. This diet is high in fruits, vegetables, and whole grains. It is low in salt, red meat, and added sugars.  Keep your sodium intake below 2,300 mg per day.  Getting at least 30-45 minutes of aerobic exercise at least 4 times per week.  Losing weight if necessary.  Not smoking.  Limiting alcoholic beverages.  Learning ways to reduce stress. Your health care provider may prescribe medicine if lifestyle changes are not enough to get your blood pressure under control, and if one of the following is true:  You are 18-59 years of age and your systolic blood pressure is above 140.  You are 60 years of age or older, and your systolic blood pressure is above 150.  Your diastolic blood pressure is above 90.  You have diabetes, and your systolic blood pressure is over 140 or your diastolic blood pressure is over 90.  You have kidney disease and your blood pressure is above 140/90.  You have heart disease and your blood pressure is above 140/90. Your personal target blood pressure may vary depending on your medical conditions, your age, and other factors. HOME CARE INSTRUCTIONS    Have your blood pressure rechecked as directed by your health care provider.   Take medicines only as directed by your health care provider. Follow the directions carefully. Blood pressure medicines must be taken as prescribed. The medicine does not work as well when you skip doses. Skipping doses also puts you at risk for  problems.  Do not smoke.   Monitor your blood pressure at home as directed by your health care provider. SEEK MEDICAL CARE IF:   You think you are having a reaction to medicines taken.  You have recurrent headaches or feel dizzy.  You have swelling in your ankles.  You have trouble with your vision. SEEK IMMEDIATE MEDICAL CARE IF:  You develop a severe headache or confusion.  You have unusual weakness, numbness, or feel faint.  You have severe chest or abdominal pain.  You vomit repeatedly.  You have trouble breathing. MAKE SURE YOU:   Understand these instructions.  Will watch your condition.  Will get help right away if you are not doing well or get worse.   This information is not intended to replace advice given to you by your health care provider. Make sure you discuss any questions you have with your health care provider.   Document Released: 04/30/2005 Document Revised: 09/14/2014 Document Reviewed: 02/20/2013 Elsevier Interactive Patient Education 2016 [employer]  

## 2015-10-19 NOTE — ED Notes (Signed)
Patient presents to ED with multiple complaints, including bilateral lower leg swelling, HTN, and L shoulder pain that radiates to her L neck. Patient states her BP medication was changed 2 weeks ago and she has had swelling in both feet and higher blood pressure since. Patient A&O x 4. NAD.

## 2015-10-19 NOTE — ED Provider Notes (Signed)
CSN: [id number]     Arrival date & time 10/19/15  [id number] History   First MD Initiated Contact with Patient 10/19/15 0600     Chief Complaint  Patient presents with  . Hypertension  . Leg Swelling     (Consider location/radiation/quality/duration/timing/severity/associated sxs/prior Treatment) Patient is a 60 y.o. female presenting with hypertension. The history is provided by the patient and medical records.  Hypertension Associated symptoms include arthralgias.    60 y.o. F with hx of depression, seasonal allergies, HTN, Crohn's disease, sleep apnea, DM, presenting to the ED for leg swelling.  She reports 1 week ago she was changed from HCTZ to spironolactone due to recurrent hypokalemia.  She reports since her medication was changed she has had increased swelling of both feet and ankles.  She denies any night time orthopnea but does reports an occasional feeling of being "winded" when walking up stairs.  Denies chest pain.  States she did talk to her PCP about this and was told she had not been on the medications long enough however she reports her pharmacist told her to come in today.  She also reports some left shoulder pain.  She states she wore a bra yesterday that was too small and she thinks it has bothered her left shoulder.  She has pain of left posterior shoulder which spans into the back of her neck.  Denies any left arm pain, dizziness, diaphoresis, nausea, emesis, numbness, or weakness.  No cardiac hx.  Patient is a smoker.  Denies chest pain or SOB currently.  No numbness or weakness.  VSS.  Past Medical History  Diagnosis Date  . Allergy   . Depression   . Hypertension   . Crohn disease ([place])   . Sleep apnea     does not use cpap since weight loss 1 and 1/2 years ago  . Diabetes mellitus type II     borderline, md checks hemaglobin A 1 c at md office    Past Surgical History  Procedure Laterality Date  . Appendectomy    . Small intestine surgery  age 55  . Breast surgery  Right 40 yrs ago    benign tumor removed  . Abdominal hysterectomy      1 ovary removed  . Laparotomy N/A 11/24/2014    Procedure: EXPLORATORY LAPAROTOMY EXTENSIVE LYSIS OF ADHESIONS SAMLL BOWEL RESECTION RIGHT HEMI COLECTOMY WOUND VAC APPLICATION. ;  Surgeon: Donnie Mesa, MD;  Location: [facility];  Service: General;  Laterality: N/A;  . Bowel resection N/A 11/24/2014    Procedure: SMALL BOWEL RESECTION;  Surgeon: Donnie Mesa, MD;  Location: [facility];  Service: General;  Laterality: N/A;  . Lysis of adhesion  11/24/2014    Procedure: LYSIS OF ADHESION (3 HRS);  Surgeon: Donnie Mesa, MD;  Location: [facility];  Service: General;;  . Partial colectomy  11/24/2014    Procedure: RIGHT HEMI COLECTOMY;  Surgeon: Donnie Mesa, MD;  Location: [facility];  Service: General;;  . Application of wound vac  11/24/2014    Procedure: APPLICATION OF WOUND VAC;  Surgeon: Donnie Mesa, MD;  Location: [facility];  Service: General;;   Family History  Problem Relation Age of Onset  . Hypertension Mother   . Arthritis Father   . Alcohol abuse Paternal Uncle   . Diabetes Paternal Grandmother   . Stroke Paternal Grandmother    Social History  Substance Use Topics  . Smoking status: Current Every Day Smoker -- 1.00 packs/day for 35 years  Types: Cigarettes  . Smokeless tobacco: Never Used  . Alcohol Use: No   OB History    No data available     Review of Systems  Cardiovascular: Positive for leg swelling.  Musculoskeletal: Positive for arthralgias.  All other systems reviewed and are negative.     Allergies  Azathioprine; Crestor; Lipitor; and Lisinopril  Home Medications   Prior to Admission medications   Medication Sig Start Date End Date Taking? Authorizing Provider  buPROPion (WELLBUTRIN SR) 100 MG 12 hr tablet Take 100 mg by mouth daily. 09/17/15   Historical Provider, MD  clonazePAM (KLONOPIN) 0.5 MG tablet Take 0.5 mg by mouth 2 (two) times daily as needed for anxiety.  02/15/15   Historical  Provider, MD  fluticasone (FLONASE) 50 MCG/ACT nasal spray Place 2 sprays into both nostrils daily as needed for allergies or rhinitis. 02/24/15   Janith Lima, MD  ibuprofen (ADVIL,MOTRIN) 200 MG tablet Take 400 mg by mouth every 6 (six) hours as needed for headache or moderate pain.    Historical Provider, MD  lamoTRIgine (LAMICTAL) 200 MG tablet Take 200 mg by mouth at bedtime.    Historical Provider, MD  potassium chloride SA (K-DUR,KLOR-CON) 20 MEQ tablet Take 1 tablet (20 mEq total) by mouth 3 (three) times daily. 10/04/15   Merryl Hacker, MD  PREVALITE 4 GM/DOSE powder TAKE 4 GRAMS TWICE A DAY 08/15/15   Historical Provider, MD  spironolactone (ALDACTONE) 25 MG tablet Take 1 tablet (25 mg total) by mouth daily. 10/12/15   Janith Lima, MD  telmisartan (MICARDIS) 40 MG tablet Take 1 tablet (40 mg total) by mouth daily. 10/06/15   Janith Lima, MD   BP 179/79 mmHg  Pulse 51  Temp(Src) 98.2 F (36.8 C) (Oral)  Resp 17  Ht 5\' 2"  (1.575 m)  Wt 90.719 kg  BMI 36.57 kg/m2  SpO2 100%  LMP 05/14/1988   Physical Exam  Constitutional: She is oriented to person, place, and time. She appears well-developed and well-nourished.  HENT:  Head: Normocephalic and atraumatic.  Mouth/Throat: Oropharynx is clear and moist.  Eyes: Conjunctivae and EOM are normal. Pupils are equal, round, and reactive to light.  Neck: Normal range of motion.  Cardiovascular: Normal rate, regular rhythm and normal heart sounds.   Pulmonary/Chest: Effort normal and breath sounds normal.  Abdominal: Soft. Bowel sounds are normal.  Musculoskeletal: Normal range of motion.  TTP of left trapezius and left cervical paraspinal muscles; no midline deformities, full ROM of neck maintained; normal strength of BUE 1+ edema of bilateral feet and ankles, no calf asymmetry, tenderness, or palpable cords; no overlying skin changes; DP pulses intact bilaterally  Neurological: She is alert and oriented to person, place, and time.   Skin: Skin is warm and dry.  Psychiatric: She has a normal mood and affect.  Nursing note and vitals reviewed.   ED Course  Procedures (including critical care time) Labs Review Labs Reviewed  BASIC METABOLIC PANEL - Abnormal; Notable for the following:    Potassium 3.0 (*)    BUN <5 (*)    All other components within normal limits  CBC WITH DIFFERENTIAL/PLATELET  TROPONIN I  BRAIN NATRIURETIC PEPTIDE  I-STAT TROPOININ, ED    Imaging Review Dg Chest 2 View  10/19/2015  CLINICAL DATA:  Hypertension.  Bilateral leg swelling for 1 week EXAM: CHEST  2 VIEW COMPARISON:  11/26/2014 FINDINGS: Borderline cardiomegaly. Mild atelectasis or scarring at the bases. No edema, effusion, or air leak.  Normal aortic and hilar contours. IMPRESSION: Minor atelectasis or scar.  Negative for edema. Electronically Signed   By: Monte Fantasia M.D.   On: 10/19/2015 06:45   I have personally reviewed and evaluated these images and lab results as part of my medical decision-making.   EKG Interpretation   Date/Time:  Wednesday October 19 2015 06:12:45 EDT Ventricular Rate:  53 PR Interval:  196 QRS Duration: 82 QT Interval:  427 QTC Calculation: 401 R Axis:   55 Text Interpretation:  Sinus rhythm Nonspecific T abnormalities, diffuse  leads No significant change since 2003 Confirmed by WARD,  DO, KRISTEN  [phone]) on 10/19/2015 6:16:11 AM Also confirmed by Leonides Schanz,  DO, KRISTEN  [phone]), editor Lorenda Cahill CT, Leda Gauze [phone])  on 10/19/2015 6:50:24 AM      MDM   Final diagnoses:  Foot swelling  Left shoulder pain   60 year old female here with a lateral foot swelling, hypertension, and left shoulder pain. Recently switched from HCTZ to spironolactone. Told by her PCP that it would take some time for this medication to adjust, however decided to come in anyway after talking with her pharmacist. Patient's blood pressure is currently stable at the time of my evaluation. Her neurologic exam is nonfocal, no  strokelike symptoms. Left shoulder pain is reproducible on exam. She reports this occurred after wearing a bra that was too tight yesterday. Given patient's history, will obtain labs, chest x-ray, EKG.  EKG with no acute ischemic changes. Labwork is reassuring aside from mild recurrent hypokalemia which was replaced here in ED.  Troponin negative.  BNP within normal limits. Chest x-ray without any vascular congestion or pulmonary edema.  Remains without chest pain or SOB.  Patient's symptoms are atypical in nature. Given her age and comorbidities, will obtain delta troponin.    9:43 AM Patient's delta trop is negative.  Low suspicion for ACS, PE, dissection, or other acute cardiac event at this time.  Not consistent with acute CHF.  VS remain stable. Upon repeat assessment patient is upset that we "haven't done anything for her".  I have discussed with patient that she has had full labs and CXR, all of which was normal aside from her potassium which was replaced.  She seems rather upset that i have not addressed her BP which is currently 155/86.  I have discussed with patient that her blood pressure is not at a critical level at this time and I do not feel this needs further intervention. I did reiterate what her PCP told her about taking time for her medications to adjust which seemed to make her more upset and she has now jerked her covers off and began unhooking herself from the monitor while mumbling that we are "stupid and don't know what we are doing here".  Patient was encouraged to continue her home medications and follow-up with her PCP for further concerns.  Given return precautions for any new or worsening symptoms.  Larene Pickett, PA-C 10/19/15 [place], PA-C 10/19/15 [place], DO 10/19/15 2309

## 2015-10-19 NOTE — ED Notes (Signed)
Pt requesting medication for BP systolic [id number]. Pt denies any symptoms at this time. PA made aware at this time. Pt reports "you haven't done anything for me, I have potassium pills at home."

## 2015-10-19 NOTE — ED Notes (Signed)
Pt very rude on departure. Reports "I am not signing anything because yall didn't do anything for me. My potassium was 300 and yall gave me two pills, I needed the intraveous potassium." pt reassured Potassium level was 3.0 and was given adequate amount to replace potassium and she is to follow up with PCP to have potassium level followed up on. Pt also concerned with her BP "almost in the 200's and nothing is being done about that, I am going to have a stroke." Pt reassured again that BP on discharge 155/86 and has been this way since she has been under this RN's care. Pt ambulatory at departure. VSS. A/o x4. Advised to follow up with PCP regarding BP medication concerns.

## 2015-10-19 NOTE — Progress Notes (Signed)
Subjective:  Patient ID: April May, female    DOB: [birth year]  Age: 60 y.o. MRN: [id number]  CC: Hypertension   HPI April May presents for concerns about her blood pressure. She complains of edema in her feet and tells me her blood pressure has not been well controlled. She was seen in the emergency room earlier today and it was found that her potassium level is still low. I recently placed her on spironolactone for its potassium sparing effects but she is not satisfied that it is controlling her blood pressure or her foot edema. She also tells me spironolactone makes her feel foggy in the head. She denies any recent episodes of dizziness, lightheadedness, headache, blurred vision, chest pain, shortness of breath, edema, or palpitations.  Outpatient Prescriptions Prior to Visit  Medication Sig Dispense Refill  . buPROPion (WELLBUTRIN SR) 100 MG 12 hr tablet Take 100 mg by mouth daily.  2  . clonazePAM (KLONOPIN) 0.5 MG tablet Take 0.5 mg by mouth 2 (two) times daily as needed for anxiety.   2  . fluticasone (FLONASE) 50 MCG/ACT nasal spray Place 2 sprays into both nostrils daily as needed for allergies or rhinitis. 16 g 11  . ibuprofen (ADVIL,MOTRIN) 200 MG tablet Take 400 mg by mouth every 6 (six) hours as needed for headache or moderate pain.    Marland Kitchen lamoTRIgine (LAMICTAL) 200 MG tablet Take 200 mg by mouth at bedtime.    . potassium chloride SA (K-DUR,KLOR-CON) 20 MEQ tablet Take 1 tablet (20 mEq total) by mouth 3 (three) times daily. 90 tablet 5  . PREVALITE 4 GM/DOSE powder TAKE 4 GRAMS TWICE A DAY  12  . spironolactone (ALDACTONE) 25 MG tablet Take 1 tablet (25 mg total) by mouth daily. 90 tablet 0  . telmisartan (MICARDIS) 40 MG tablet Take 1 tablet (40 mg total) by mouth daily. 90 tablet 1   No facility-administered medications prior to visit.    ROS Review of Systems  Constitutional: Negative.  Negative for fever, chills, activity change, appetite change, fatigue  and unexpected weight change.  HENT: Negative.   Eyes: Negative.   Respiratory: Negative.  Negative for cough, choking, chest tightness, shortness of breath and stridor.   Cardiovascular: Positive for leg swelling. Negative for chest pain and palpitations.  Gastrointestinal: Positive for diarrhea. Negative for nausea, vomiting, abdominal pain, constipation and blood in stool.       She has about 3-4 loose watery stools a day. This is a complication from her prior intestinal surgery. She has been seeing her gastroenterologist to help manage this.  Endocrine: Negative.   Genitourinary: Negative.  Negative for difficulty urinating.  Musculoskeletal: Negative.  Negative for myalgias, back pain, joint swelling and neck pain.  Skin: Negative.  Negative for color change and rash.  Allergic/Immunologic: Negative.   Neurological: Negative.  Negative for dizziness, tremors, weakness, light-headedness, numbness and headaches.  Hematological: Negative.  Negative for adenopathy. Does not bruise/bleed easily.  Psychiatric/Behavioral: Negative.     Objective:  BP 168/80 mmHg  Pulse 72  Temp(Src) 98.2 F (36.8 C) (Oral)  Resp 20  Wt 204 lb (92.534 kg)  SpO2 96%  LMP 05/14/1988  BP Readings from Last 3 Encounters:  10/19/15 168/80  10/19/15 155/86  10/04/15 122/74    Wt Readings from Last 3 Encounters:  10/19/15 204 lb (92.534 kg)  10/19/15 200 lb (90.719 kg)  10/03/15 200 lb (90.719 kg)    Physical Exam  Constitutional: She is oriented to person,  place, and time. No distress.  HENT:  Mouth/Throat: Oropharynx is clear and moist. No oropharyngeal exudate.  Eyes: Conjunctivae are normal. Right eye exhibits no discharge. Left eye exhibits no discharge. No scleral icterus.  Neck: Normal range of motion. Neck supple. No JVD present. No tracheal deviation present. No thyromegaly present.  Cardiovascular: Normal rate, regular rhythm, normal heart sounds and intact distal pulses.  Exam reveals no  gallop and no friction rub.   No murmur heard. Pulmonary/Chest: Effort normal and breath sounds normal. No stridor. No respiratory distress. She has no wheezes. She has no rales. She exhibits no tenderness.  Abdominal: Soft. Bowel sounds are normal. She exhibits no distension and no mass. There is no tenderness. There is no rebound and no guarding.  Musculoskeletal: Normal range of motion. She exhibits edema (1+ pitting edema in both feet). She exhibits no tenderness.  Lymphadenopathy:    She has no cervical adenopathy.  Neurological: She is oriented to person, place, and time.  Skin: Skin is warm and dry. No rash noted. She is not diaphoretic. No erythema. No pallor.  Psychiatric: She has a normal mood and affect. Judgment and thought content normal.  Vitals reviewed.   Lab Results  Component Value Date   WBC 9.5 10/19/2015   HGB 12.5 10/19/2015   HCT 38.9 10/19/2015   PLT 290 10/19/2015   GLUCOSE 92 10/19/2015   CHOL 118 02/24/2015   TRIG 120.0 02/24/2015   HDL 35.40* 02/24/2015   LDLCALC 58 02/24/2015   ALT 17 10/03/2015   AST 24 10/03/2015   NA 141 10/19/2015   K 3.0* 10/19/2015   CL 105 10/19/2015   CREATININE 0.74 10/19/2015   BUN <5* 10/19/2015   CO2 28 10/19/2015   TSH 1.33 02/24/2015   HGBA1C 6.0 10/03/2015   MICROALBUR 1.3 02/24/2015    Dg Chest 2 View  10/19/2015  CLINICAL DATA:  Hypertension.  Bilateral leg swelling for 1 week EXAM: CHEST  2 VIEW COMPARISON:  11/26/2014 FINDINGS: Borderline cardiomegaly. Mild atelectasis or scarring at the bases. No edema, effusion, or air leak. Normal aortic and hilar contours. IMPRESSION: Minor atelectasis or scar.  Negative for edema. Electronically Signed   By: Monte Fantasia M.D.   On: 10/19/2015 06:45    Assessment & Plan:   April May was seen today for hypertension.  Diagnoses and all orders for this visit:  HYPERTENSION, BENIGN ESSENTIAL- she has not tolerated spironolactone so will discontinue, her blood pressure is  not well controlled and I think she needs a potassium sparing agent so I have asked her to start taking Dyazide. She will remain on an ARB for renal protection. -     triamterene-hydrochlorothiazide (DYAZIDE) 37.5-25 MG capsule; Take 1 each (1 capsule total) by mouth daily. -     Azilsartan Medoxomil (EDARBI) 80 MG TABS; Take 1 tablet (80 mg total) by mouth daily.  Type 2 diabetes mellitus with complication, without long-term current use of insulin (May)- recent A1c shows her blood sugars are well-controlled, she will remain on an ARB for renal protection. -     Azilsartan Medoxomil (EDARBI) 80 MG TABS; Take 1 tablet (80 mg total) by mouth daily.   I have discontinued Ms. Colton's telmisartan and spironolactone. I am also having her start on triamterene-hydrochlorothiazide and Azilsartan Medoxomil. Additionally, I am having her maintain her lamoTRIgine, fluticasone, ibuprofen, clonazePAM, PREVALITE, buPROPion, and potassium chloride SA.  Meds ordered this encounter  Medications  . triamterene-hydrochlorothiazide (DYAZIDE) 37.5-25 MG capsule    Sig:  Take 1 each (1 capsule total) by mouth daily.    Dispense:  30 capsule    Refill:  3  . Azilsartan Medoxomil (EDARBI) 80 MG TABS    Sig: Take 1 tablet (80 mg total) by mouth daily.    Dispense:  35 tablet    Refill:  0     Follow-up: No Follow-up on file.  Scarlette Calico, MD

## 2015-10-19 NOTE — Progress Notes (Signed)
Pre visit review using our clinic review tool, if applicable. No additional management support is needed unless otherwise documented below in the visit note. 

## 2015-10-19 NOTE — Discharge Instructions (Signed)
Continue taking your medication as directed. Follow-up with your primary care doctor. Return here for new concerns.

## 2015-10-20 ENCOUNTER — Encounter: Payer: Self-pay | Admitting: Internal Medicine

## 2015-10-20 ENCOUNTER — Ambulatory Visit: Payer: Medicare Other | Admitting: Internal Medicine

## 2015-10-24 ENCOUNTER — Other Ambulatory Visit: Payer: Self-pay | Admitting: Internal Medicine

## 2015-10-24 DIAGNOSIS — I10 Essential (primary) hypertension: Secondary | ICD-10-CM

## 2015-10-24 MED ORDER — NEBIVOLOL HCL 5 MG PO TABS: 5.0000 mg | ORAL_TABLET | Freq: Every day | ORAL | Status: DC

## 2015-10-25 ENCOUNTER — Ambulatory Visit: Payer: Medicare Other | Admitting: Internal Medicine

## 2015-10-30 ENCOUNTER — Encounter: Payer: Self-pay | Admitting: Internal Medicine

## 2015-10-31 ENCOUNTER — Other Ambulatory Visit: Payer: Self-pay | Admitting: Internal Medicine

## 2015-10-31 DIAGNOSIS — J0100 Acute maxillary sinusitis, unspecified: Secondary | ICD-10-CM | POA: Insufficient documentation

## 2015-10-31 MED ORDER — CEFDINIR 300 MG PO CAPS: 300.0000 mg | ORAL_CAPSULE | Freq: Two times a day (BID) | ORAL | Status: DC

## 2015-11-17 ENCOUNTER — Ambulatory Visit (INDEPENDENT_AMBULATORY_CARE_PROVIDER_SITE_OTHER): Payer: Medicare Other | Admitting: Internal Medicine

## 2015-11-17 ENCOUNTER — Encounter: Payer: Self-pay | Admitting: Internal Medicine

## 2015-11-17 ENCOUNTER — Other Ambulatory Visit (INDEPENDENT_AMBULATORY_CARE_PROVIDER_SITE_OTHER): Payer: Medicare Other

## 2015-11-17 VITALS — BP 108/62 | HR 74 | Temp 98.3°F | Resp 16 | Wt 196.0 lb

## 2015-11-17 DIAGNOSIS — E876 Hypokalemia: Secondary | ICD-10-CM

## 2015-11-17 DIAGNOSIS — I10 Essential (primary) hypertension: Secondary | ICD-10-CM

## 2015-11-17 DIAGNOSIS — H6123 Impacted cerumen, bilateral: Secondary | ICD-10-CM

## 2015-11-17 NOTE — Patient Instructions (Signed)
Hypertension Hypertension, commonly called high blood pressure, is when the force of blood pumping through your arteries is too strong. Your arteries are the blood vessels that carry blood from your heart throughout your body. A blood pressure reading consists of a higher number over a lower number, such as 110/72. The higher number (systolic) is the pressure inside your arteries when your heart pumps. The lower number (diastolic) is the pressure inside your arteries when your heart relaxes. Ideally you want your blood pressure below 120/80. Hypertension forces your heart to work harder to pump blood. Your arteries may become narrow or stiff. Having untreated or uncontrolled hypertension can cause heart attack, stroke, kidney disease, and other problems. RISK FACTORS Some risk factors for high blood pressure are controllable. Others are not.  Risk factors you cannot control include:   Race. You may be at higher risk if you are African American.  Age. Risk increases with age.  Gender. Men are at higher risk than women before age 45 years. After age 65, women are at higher risk than men. Risk factors you can control include:  Not getting enough exercise or physical activity.  Being overweight.  Getting too much fat, sugar, calories, or salt in your diet.  Drinking too much alcohol. SIGNS AND SYMPTOMS Hypertension does not usually cause signs or symptoms. Extremely high blood pressure (hypertensive crisis) may cause headache, anxiety, shortness of breath, and nosebleed. DIAGNOSIS To check if you have hypertension, your health care provider will measure your blood pressure while you are seated, with your arm held at the level of your heart. It should be measured at least twice using the same arm. Certain conditions can cause a difference in blood pressure between your right and left arms. A blood pressure reading that is higher than normal on one occasion does not mean that you need treatment. If  it is not clear whether you have high blood pressure, you may be asked to return on a different day to have your blood pressure checked again. Or, you may be asked to monitor your blood pressure at home for 1 or more weeks. TREATMENT Treating high blood pressure includes making lifestyle changes and possibly taking medicine. Living a healthy lifestyle can help lower high blood pressure. You may need to change some of your habits. Lifestyle changes may include:  Following the DASH diet. This diet is high in fruits, vegetables, and whole grains. It is low in salt, red meat, and added sugars.  Keep your sodium intake below 2,300 mg per day.  Getting at least 30-45 minutes of aerobic exercise at least 4 times per week.  Losing weight if necessary.  Not smoking.  Limiting alcoholic beverages.  Learning ways to reduce stress. Your health care provider may prescribe medicine if lifestyle changes are not enough to get your blood pressure under control, and if one of the following is true:  You are 18-59 years of age and your systolic blood pressure is above 140.  You are 60 years of age or older, and your systolic blood pressure is above 150.  Your diastolic blood pressure is above 90.  You have diabetes, and your systolic blood pressure is over 140 or your diastolic blood pressure is over 90.  You have kidney disease and your blood pressure is above 140/90.  You have heart disease and your blood pressure is above 140/90. Your personal target blood pressure may vary depending on your medical conditions, your age, and other factors. HOME CARE INSTRUCTIONS    Have your blood pressure rechecked as directed by your health care provider.   Take medicines only as directed by your health care provider. Follow the directions carefully. Blood pressure medicines must be taken as prescribed. The medicine does not work as well when you skip doses. Skipping doses also puts you at risk for  problems.  Do not smoke.   Monitor your blood pressure at home as directed by your health care provider. SEEK MEDICAL CARE IF:   You think you are having a reaction to medicines taken.  You have recurrent headaches or feel dizzy.  You have swelling in your ankles.  You have trouble with your vision. SEEK IMMEDIATE MEDICAL CARE IF:  You develop a severe headache or confusion.  You have unusual weakness, numbness, or feel faint.  You have severe chest or abdominal pain.  You vomit repeatedly.  You have trouble breathing. MAKE SURE YOU:   Understand these instructions.  Will watch your condition.  Will get help right away if you are not doing well or get worse.   This information is not intended to replace advice given to you by your health care provider. Make sure you discuss any questions you have with your health care provider.   Document Released: 04/30/2005 Document Revised: 09/14/2014 Document Reviewed: 02/20/2013 Elsevier Interactive Patient Education 2016 [employer]  

## 2015-11-17 NOTE — Progress Notes (Signed)
Subjective:  Patient ID: April May, female    DOB: 1956/01/03  Age: 60 y.o. MRN: [id number]  CC: Hypertension   HPI April May presents for a BP check. Since I last saw her she tells me that her blood sugars been very well controlled. She did not start taking nebivolol. She denies headache, blurred vision, chest pain, shortness of breath, palpitations, edema, or fatigue.  She complains of muffled hearing in both ears and wants to have her ears checked for wax accumulation. She denies loss of hearing or vertigo.  Outpatient Prescriptions Prior to Visit  Medication Sig Dispense Refill  . buPROPion (WELLBUTRIN SR) 100 MG 12 hr tablet Take 100 mg by mouth daily.  2  . clonazePAM (KLONOPIN) 0.5 MG tablet Take 0.5 mg by mouth 2 (two) times daily as needed for anxiety.   2  . fluticasone (FLONASE) 50 MCG/ACT nasal spray Place 2 sprays into both nostrils daily as needed for allergies or rhinitis. 16 g 11  . ibuprofen (ADVIL,MOTRIN) 200 MG tablet Take 400 mg by mouth every 6 (six) hours as needed for headache or moderate pain.    Marland Kitchen lamoTRIgine (LAMICTAL) 200 MG tablet Take 200 mg by mouth at bedtime.    . potassium chloride SA (K-DUR,KLOR-CON) 20 MEQ tablet Take 1 tablet (20 mEq total) by mouth 3 (three) times daily. 90 tablet 5  . PREVALITE 4 GM/DOSE powder TAKE 4 GRAMS TWICE A DAY  12  . triamterene-hydrochlorothiazide (DYAZIDE) 37.5-25 MG capsule Take 1 each (1 capsule total) by mouth daily. 30 capsule 3  . Azilsartan Medoxomil (EDARBI) 80 MG TABS Take 1 tablet (80 mg total) by mouth daily. 35 tablet 0  . cefdinir (OMNICEF) 300 MG capsule Take 1 capsule (300 mg total) by mouth 2 (two) times daily. 20 capsule 1  . nebivolol (BYSTOLIC) 5 MG tablet Take 1 tablet (5 mg total) by mouth daily. 30 tablet 3   No facility-administered medications prior to visit.    ROS Review of Systems  Constitutional: Negative.  Negative for diaphoresis and unexpected weight change.  HENT:  Positive for ear pain. Negative for facial swelling and sinus pressure.   Eyes: Negative.  Negative for visual disturbance.  Respiratory: Negative.  Negative for cough, choking, shortness of breath and stridor.   Cardiovascular: Negative.  Negative for chest pain, palpitations and leg swelling.  Gastrointestinal: Negative.  Negative for nausea, vomiting, abdominal pain, diarrhea and constipation.  Endocrine: Negative.   Genitourinary: Negative.   Musculoskeletal: Negative.  Negative for myalgias, back pain, joint swelling and arthralgias.  Skin: Negative.  Negative for color change and rash.  Allergic/Immunologic: Negative.   Neurological: Negative.  Negative for dizziness.  Hematological: Negative.  Negative for adenopathy. Does not bruise/bleed easily.  Psychiatric/Behavioral: Negative.     Objective:  BP 108/62 mmHg  Pulse 74  Temp(Src) 98.3 F (36.8 C) (Oral)  Resp 16  Wt 196 lb (88.905 kg)  SpO2 98%  LMP 05/14/1988  BP Readings from Last 3 Encounters:  11/17/15 108/62  10/19/15 168/80  10/19/15 155/86    Wt Readings from Last 3 Encounters:  11/17/15 196 lb (88.905 kg)  10/19/15 204 lb (92.534 kg)  10/19/15 200 lb (90.719 kg)    Physical Exam  Constitutional: She is oriented to person, place, and time. No distress.  HENT:  Right Ear: Hearing, tympanic membrane, external ear and ear canal normal.  Left Ear: Hearing, tympanic membrane, external ear and ear canal normal.  Mouth/Throat: Oropharynx is clear  and moist. No oropharyngeal exudate.  There is cerumen impaction in both ears. I put Colace liquid in both ears and then irrigated the ears and used a water pick to remove the wax. This was successful and she tolerated it well. Examination of ears afterwards is normal.  Eyes: Conjunctivae are normal. Right eye exhibits no discharge. Left eye exhibits no discharge. No scleral icterus.  Neck: Normal range of motion. Neck supple. No JVD present. No tracheal deviation  present. No thyromegaly present.  Cardiovascular: Normal rate, regular rhythm, normal heart sounds and intact distal pulses.  Exam reveals no gallop and no friction rub.   No murmur heard. Pulmonary/Chest: Effort normal and breath sounds normal. No stridor. No respiratory distress. She has no wheezes. She has no rales. She exhibits no tenderness.  Abdominal: Soft. Bowel sounds are normal. She exhibits no distension and no mass. There is no tenderness. There is no rebound and no guarding.  Musculoskeletal: Normal range of motion. She exhibits no edema or tenderness.  Lymphadenopathy:    She has no cervical adenopathy.  Neurological: She is oriented to person, place, and time.  Skin: Skin is warm and dry. No rash noted. She is not diaphoretic. No erythema. No pallor.  Vitals reviewed.   Lab Results  Component Value Date   WBC 9.5 10/19/2015   HGB 12.5 10/19/2015   HCT 38.9 10/19/2015   PLT 290 10/19/2015   GLUCOSE 99 11/17/2015   CHOL 118 02/24/2015   TRIG 120.0 02/24/2015   HDL 35.40* 02/24/2015   LDLCALC 58 02/24/2015   ALT 17 10/03/2015   AST 24 10/03/2015   NA 142 11/17/2015   K 3.3* 11/17/2015   CL 103 11/17/2015   CREATININE 1.51* 11/17/2015   BUN 16 11/17/2015   CO2 31 11/17/2015   TSH 1.33 02/24/2015   HGBA1C 6.0 10/03/2015   MICROALBUR 1.3 02/24/2015    Dg Chest 2 View  10/19/2015  CLINICAL DATA:  Hypertension.  Bilateral leg swelling for 1 week EXAM: CHEST  2 VIEW COMPARISON:  11/26/2014 FINDINGS: Borderline cardiomegaly. Mild atelectasis or scarring at the bases. No edema, effusion, or air leak. Normal aortic and hilar contours. IMPRESSION: Minor atelectasis or scar.  Negative for edema. Electronically Signed   By: Monte Fantasia M.D.   On: 10/19/2015 06:45    Assessment & Plan:   April May was seen today for hypertension.  Diagnoses and all orders for this visit:  HYPERTENSION, BENIGN ESSENTIAL- Her blood pressure is adequately well-controlled with the  combination of an ARB plus thiazide diuretic. -     Magnesium; Future -     Basic metabolic panel; Future  Hypokalemia- her potassium level is improving, I encouraged her to continue taking the potassium supplement as directed. -     Magnesium; Future -     Basic metabolic panel; Future  Cerumen impaction, bilateral- she is status post successful bilateral irrigation with removal of the cerumen.   I have discontinued Ms. Hammitt's nebivolol and cefdinir. I am also having her maintain her lamoTRIgine, fluticasone, ibuprofen, clonazePAM, PREVALITE, buPROPion, potassium chloride SA, and triamterene-hydrochlorothiazide.  No orders of the defined types were placed in this encounter.     Follow-up: Return in about 4 months (around 03/19/2016).  Scarlette Calico, MD

## 2015-11-18 ENCOUNTER — Other Ambulatory Visit: Payer: Self-pay | Admitting: Internal Medicine

## 2015-11-18 ENCOUNTER — Encounter: Payer: Self-pay | Admitting: Internal Medicine

## 2015-11-18 DIAGNOSIS — H6120 Impacted cerumen, unspecified ear: Secondary | ICD-10-CM | POA: Insufficient documentation

## 2015-11-18 DIAGNOSIS — I10 Essential (primary) hypertension: Secondary | ICD-10-CM

## 2015-11-18 DIAGNOSIS — E118 Type 2 diabetes mellitus with unspecified complications: Secondary | ICD-10-CM

## 2015-11-18 LAB — BASIC METABOLIC PANEL
BUN: 16 mg/dL (ref 6–23)
CALCIUM: 9.3 mg/dL (ref 8.4–10.5)
CO2: 31 mEq/L (ref 19–32)
CREATININE: 1.51 mg/dL — AB (ref 0.40–1.20)
Chloride: 103 mEq/L (ref 96–112)
GFR: 45.2 mL/min — AB (ref >60.00)
GLUCOSE: 99 mg/dL (ref 70–99)
POTASSIUM: 3.3 meq/L — AB (ref 3.5–5.1)
Sodium: 142 mEq/L (ref 135–145)

## 2015-11-18 LAB — MAGNESIUM: Magnesium: 1.9 mg/dL (ref 1.5–2.5)

## 2015-11-18 MED ORDER — AZILSARTAN MEDOXOMIL 80 MG PO TABS: 1.0000 | ORAL_TABLET | Freq: Every day | ORAL | Status: DC

## 2015-11-18 NOTE — Addendum Note (Signed)
Addended by: Janith Lima on: 11/18/2015 05:19 PM   Modules accepted: Orders

## 2015-11-21 ENCOUNTER — Encounter: Payer: Self-pay | Admitting: Internal Medicine

## 2015-11-22 ENCOUNTER — Other Ambulatory Visit: Payer: Self-pay | Admitting: *Deleted

## 2015-11-22 DIAGNOSIS — I10 Essential (primary) hypertension: Secondary | ICD-10-CM

## 2015-11-22 MED ORDER — TRIAMTERENE-HCTZ 37.5-25 MG PO CAPS: 1.0000 | ORAL_CAPSULE | Freq: Every day | ORAL | Status: DC

## 2015-11-23 ENCOUNTER — Ambulatory Visit: Payer: Medicare Other | Admitting: Podiatry

## 2015-11-30 ENCOUNTER — Ambulatory Visit: Payer: Medicare Other | Admitting: Podiatry

## 2015-12-21 ENCOUNTER — Ambulatory Visit: Payer: Medicare Other | Admitting: Podiatry

## 2016-01-17 ENCOUNTER — Ambulatory Visit (INDEPENDENT_AMBULATORY_CARE_PROVIDER_SITE_OTHER): Payer: Medicare Other | Admitting: Podiatry

## 2016-01-17 ENCOUNTER — Encounter: Payer: Self-pay | Admitting: Podiatry

## 2016-01-17 VITALS — BP 107/56 | HR 73 | Resp 14

## 2016-01-17 DIAGNOSIS — R0989 Other specified symptoms and signs involving the circulatory and respiratory systems: Secondary | ICD-10-CM

## 2016-01-17 DIAGNOSIS — Q828 Other specified congenital malformations of skin: Secondary | ICD-10-CM | POA: Diagnosis not present

## 2016-01-17 NOTE — Patient Instructions (Signed)
Today your diabetic foot examination demonstrated hard to feel pulses in your left foot and will arrange for lower extremity arterial Doppler to check circulation in your left leg. This is a noninvasive test. The vascular lab we'll contact you our office will notify you the results of examination for circulation The callus on the bottom of your foot is a chronic skin lesion call porokeratosis and needs to BE trimmed as needed to maintain comfort   Diabetes and Foot Care Diabetes may cause you to have problems because of poor blood supply (circulation) to your feet and legs. This may cause the skin on your feet to become thinner, break easier, and heal more slowly. Your skin may become dry, and the skin may peel and crack. You may also have nerve damage in your legs and feet causing decreased feeling in them. You may not notice minor injuries to your feet that could lead to infections or more serious problems. Taking care of your feet is one of the most important things you can do for yourself.  HOME CARE INSTRUCTIONS  Wear shoes at all times, even in the house. Do not go barefoot. Bare feet are easily injured.  Check your feet daily for blisters, cuts, and redness. If you cannot see the bottom of your feet, use a mirror or ask someone for help.  Wash your feet with warm water (do not use hot water) and mild soap. Then pat your feet and the areas between your toes until they are completely dry. Do not soak your feet as this can dry your skin.  Apply a moisturizing lotion or petroleum jelly (that does not contain alcohol and is unscented) to the skin on your feet and to dry, brittle toenails. Do not apply lotion between your toes.  Trim your toenails straight across. Do not dig under them or around the cuticle. File the edges of your nails with an emery board or nail file.  Do not cut corns or calluses or try to remove them with medicine.  Wear clean socks or stockings every day. Make sure they are  not too tight. Do not wear knee-high stockings since they may decrease blood flow to your legs.  Wear shoes that fit properly and have enough cushioning. To break in new shoes, wear them for just a few hours a day. This prevents you from injuring your feet. Always look in your shoes before you put them on to be sure there are no objects inside.  Do not cross your legs. This may decrease the blood flow to your feet.  If you find a minor scrape, cut, or break in the skin on your feet, keep it and the skin around it clean and dry. These areas may be cleansed with mild soap and water. Do not cleanse the area with peroxide, alcohol, or iodine.  When you remove an adhesive bandage, be sure not to damage the skin around it.  If you have a wound, look at it several times a day to make sure it is healing.  Do not use heating pads or hot water bottles. They may burn your skin. If you have lost feeling in your feet or legs, you may not know it is happening until it is too late.  Make sure your health care provider performs a complete foot exam at least annually or more often if you have foot problems. Report any cuts, sores, or bruises to your health care provider immediately. SEEK MEDICAL CARE IF:  You have an injury that is not healing.  You have cuts or breaks in the skin.  You have an ingrown nail.  You notice redness on your legs or feet.  You feel burning or tingling in your legs or feet.  You have pain or cramps in your legs and feet.  Your legs or feet are numb.  Your feet always feel cold. SEEK IMMEDIATE MEDICAL CARE IF:   There is increasing redness, swelling, or pain in or around a wound.  There is a red line that goes up your leg.  Pus is coming from a wound.  You develop a fever or as directed by your health care provider.  You notice a bad smell coming from an ulcer or wound.   This information is not intended to replace advice given to you by your health care  provider. Make sure you discuss any questions you have with your health care provider.   Document Released: 04/27/2000 Document Revised: 12/31/2012 Document Reviewed: 10/07/2012 Elsevier Interactive Patient Education [employer].

## 2016-01-17 NOTE — Progress Notes (Signed)
   Subjective:    Patient ID: April May, female    DOB: June 23, 1955, 60 y.o.   MRN: [id number]  HPI   This patient presents today complaining of painful nucleated skin lesion plantar left third MPJ aggravated with standing and walking and relieved with rest and elevation. Patient describes " picking at it". She denies any infection or any other professional treatment  Patient is a diabetic and denies history of foot ulceration, claudication or amputation  Patient is a smoker 1 pack daily 30 years  Review of Systems  All other systems reviewed and are negative.      Objective:   Physical Exam  Orientated 3  Vascular: No calf edema or calf tenderness bilaterally DP pulses 2/4 right 0/4 left PT pulses 2/4 right 0/4 left Capillary reflex within normal limits bilaterally  Neurological: Sensation to 10 g monofilament wire intact 4/5 right and 5/5 left Vibratory sensation reactive bilaterally Ankle reflex equal and reactive bilaterally  Dermatological: No open skin lesions bilaterally Nucleated plantar keratoses third left MPJ Hypertrophy of the hallux nails  Musculoskeletal: No deformities noted bilaterally There is no restriction ankle, subtalar, midtarsal joints bilaterally       Assessment & Plan:   Assessment: Absent pedal pulses left Protective sensation intact bilaterally Porokeratosis 1 plantar left  Plan: Reviewed the results of examination with patient today. I informed her that the pedal pulses left were nonpalpable and I am referring her to vascular lab for lower extremity arterial Doppler with ABIs and TBI's for the indication diabetic with absent pedal pulses left  Informed patient that the plantar skin lesion left was chronic and I recommended periodic debridement. The porokeratosis was debrided 1 without any bleeding and patient will return as needed for debridement  Notify patient upon results of lower extremity arterial Doppler

## 2016-01-23 ENCOUNTER — Other Ambulatory Visit: Payer: Self-pay | Admitting: Podiatry

## 2016-01-23 DIAGNOSIS — R0989 Other specified symptoms and signs involving the circulatory and respiratory systems: Secondary | ICD-10-CM

## 2016-02-15 ENCOUNTER — Inpatient Hospital Stay (HOSPITAL_COMMUNITY): Admission: RE | Admit: 2016-02-15 | Payer: Medicare Other | Source: Ambulatory Visit

## 2016-02-15 DIAGNOSIS — K50814 Crohn's disease of both small and large intestine with abscess: Secondary | ICD-10-CM | POA: Diagnosis not present

## 2016-03-21 ENCOUNTER — Encounter: Payer: Self-pay | Admitting: Internal Medicine

## 2016-03-21 ENCOUNTER — Other Ambulatory Visit (INDEPENDENT_AMBULATORY_CARE_PROVIDER_SITE_OTHER): Payer: Medicare Other

## 2016-03-21 ENCOUNTER — Other Ambulatory Visit: Payer: Self-pay | Admitting: Internal Medicine

## 2016-03-21 ENCOUNTER — Ambulatory Visit (INDEPENDENT_AMBULATORY_CARE_PROVIDER_SITE_OTHER): Payer: Medicare Other | Admitting: Internal Medicine

## 2016-03-21 VITALS — BP 110/70 | HR 81 | Temp 98.2°F | Resp 16 | Ht 62.0 in | Wt 201.0 lb

## 2016-03-21 DIAGNOSIS — E7800 Pure hypercholesterolemia, unspecified: Secondary | ICD-10-CM | POA: Diagnosis not present

## 2016-03-21 DIAGNOSIS — I10 Essential (primary) hypertension: Secondary | ICD-10-CM

## 2016-03-21 DIAGNOSIS — I351 Nonrheumatic aortic (valve) insufficiency: Secondary | ICD-10-CM | POA: Diagnosis not present

## 2016-03-21 DIAGNOSIS — R0609 Other forms of dyspnea: Secondary | ICD-10-CM

## 2016-03-21 DIAGNOSIS — E876 Hypokalemia: Secondary | ICD-10-CM

## 2016-03-21 DIAGNOSIS — R0600 Dyspnea, unspecified: Secondary | ICD-10-CM | POA: Insufficient documentation

## 2016-03-21 DIAGNOSIS — E118 Type 2 diabetes mellitus with unspecified complications: Secondary | ICD-10-CM

## 2016-03-21 DIAGNOSIS — Z23 Encounter for immunization: Secondary | ICD-10-CM

## 2016-03-21 LAB — COMPREHENSIVE METABOLIC PANEL
ALBUMIN: 4.1 g/dL (ref 3.5–5.2)
ALK PHOS: 76 U/L (ref 39–117)
ALT: 10 U/L (ref 0–35)
AST: 12 U/L (ref 0–37)
BILIRUBIN TOTAL: 0.3 mg/dL (ref 0.2–1.2)
BUN: 12 mg/dL (ref 6–23)
CO2: 26 mEq/L (ref 19–32)
CREATININE: 1 mg/dL (ref 0.40–1.20)
Calcium: 9.6 mg/dL (ref 8.4–10.5)
Chloride: 112 mEq/L (ref 96–112)
GFR: 72.64 mL/min (ref >60.00)
Glucose, Bld: 97 mg/dL (ref 70–99)
POTASSIUM: 4 meq/L (ref 3.5–5.1)
SODIUM: 144 meq/L (ref 135–145)
TOTAL PROTEIN: 7.1 g/dL (ref 6.0–8.3)

## 2016-03-21 LAB — CBC WITH DIFFERENTIAL/PLATELET
BASOS ABS: 0 10*3/uL (ref 0.0–0.1)
Basophils Relative: 0.2 % (ref 0.0–3.0)
EOS ABS: 0.2 10*3/uL (ref 0.0–0.7)
EOS PCT: 1.7 % (ref 0.0–5.0)
HCT: 39 % (ref 36.0–46.0)
HEMOGLOBIN: 13.1 g/dL (ref 12.0–15.0)
Lymphocytes Relative: 33.1 % (ref 12.0–46.0)
Lymphs Abs: 3.9 10*3/uL (ref 0.7–4.0)
MCHC: 33.7 g/dL (ref 30.0–36.0)
MCV: 93.6 fl (ref 78.0–100.0)
MONO ABS: 0.8 10*3/uL (ref 0.1–1.0)
Monocytes Relative: 6.6 % (ref 3.0–12.0)
Neutro Abs: 6.9 10*3/uL (ref 1.4–7.7)
Neutrophils Relative %: 58.4 % (ref 43.0–77.0)
Platelets: 307 10*3/uL (ref 150.0–400.0)
RBC: 4.17 Mil/uL (ref 3.87–5.11)
RDW: 13.9 % (ref 11.5–15.5)
WBC: 11.8 10*3/uL — AB (ref 4.0–10.5)

## 2016-03-21 LAB — LIPID PANEL
CHOL/HDL RATIO: 3
Cholesterol: 102 mg/dL (ref 0–200)
HDL: 36.5 mg/dL — AB (ref >39.00)
LDL CALC: 37 mg/dL (ref 0–99)
NONHDL: 65.69
Triglycerides: 142 mg/dL (ref 0.0–149.0)
VLDL: 28.4 mg/dL (ref 0.0–40.0)

## 2016-03-21 LAB — TSH: TSH: 0.61 u[IU]/mL (ref 0.35–4.50)

## 2016-03-21 LAB — MAGNESIUM: Magnesium: 1.7 mg/dL (ref 1.5–2.5)

## 2016-03-21 LAB — HEMOGLOBIN A1C: Hgb A1c MFr Bld: 5.7 % (ref 4.6–6.5)

## 2016-03-21 NOTE — Patient Instructions (Signed)
Hypertension Hypertension, commonly called high blood pressure, is when the force of blood pumping through your arteries is too strong. Your arteries are the blood vessels that carry blood from your heart throughout your body. A blood pressure reading consists of a higher number over a lower number, such as 110/72. The higher number (systolic) is the pressure inside your arteries when your heart pumps. The lower number (diastolic) is the pressure inside your arteries when your heart relaxes. Ideally you want your blood pressure below 120/80. Hypertension forces your heart to work harder to pump blood. Your arteries may become narrow or stiff. Having untreated or uncontrolled hypertension can cause heart attack, stroke, kidney disease, and other problems. RISK FACTORS Some risk factors for high blood pressure are controllable. Others are not.  Risk factors you cannot control include:   Race. You may be at higher risk if you are African American.  Age. Risk increases with age.  Gender. Men are at higher risk than women before age 45 years. After age 65, women are at higher risk than men. Risk factors you can control include:  Not getting enough exercise or physical activity.  Being overweight.  Getting too much fat, sugar, calories, or salt in your diet.  Drinking too much alcohol. SIGNS AND SYMPTOMS Hypertension does not usually cause signs or symptoms. Extremely high blood pressure (hypertensive crisis) may cause headache, anxiety, shortness of breath, and nosebleed. DIAGNOSIS To check if you have hypertension, your health care provider will measure your blood pressure while you are seated, with your arm held at the level of your heart. It should be measured at least twice using the same arm. Certain conditions can cause a difference in blood pressure between your right and left arms. A blood pressure reading that is higher than normal on one occasion does not mean that you need treatment. If  it is not clear whether you have high blood pressure, you may be asked to return on a different day to have your blood pressure checked again. Or, you may be asked to monitor your blood pressure at home for 1 or more weeks. TREATMENT Treating high blood pressure includes making lifestyle changes and possibly taking medicine. Living a healthy lifestyle can help lower high blood pressure. You may need to change some of your habits. Lifestyle changes may include:  Following the DASH diet. This diet is high in fruits, vegetables, and whole grains. It is low in salt, red meat, and added sugars.  Keep your sodium intake below 2,300 mg per day.  Getting at least 30-45 minutes of aerobic exercise at least 4 times per week.  Losing weight if necessary.  Not smoking.  Limiting alcoholic beverages.  Learning ways to reduce stress. Your health care provider may prescribe medicine if lifestyle changes are not enough to get your blood pressure under control, and if one of the following is true:  You are 18-59 years of age and your systolic blood pressure is above 140.  You are 60 years of age or older, and your systolic blood pressure is above 150.  Your diastolic blood pressure is above 90.  You have diabetes, and your systolic blood pressure is over 140 or your diastolic blood pressure is over 90.  You have kidney disease and your blood pressure is above 140/90.  You have heart disease and your blood pressure is above 140/90. Your personal target blood pressure may vary depending on your medical conditions, your age, and other factors. HOME CARE INSTRUCTIONS    Have your blood pressure rechecked as directed by your health care provider.   Take medicines only as directed by your health care provider. Follow the directions carefully. Blood pressure medicines must be taken as prescribed. The medicine does not work as well when you skip doses. Skipping doses also puts you at risk for  problems.  Do not smoke.   Monitor your blood pressure at home as directed by your health care provider. SEEK MEDICAL CARE IF:   You think you are having a reaction to medicines taken.  You have recurrent headaches or feel dizzy.  You have swelling in your ankles.  You have trouble with your vision. SEEK IMMEDIATE MEDICAL CARE IF:  You develop a severe headache or confusion.  You have unusual weakness, numbness, or feel faint.  You have severe chest or abdominal pain.  You vomit repeatedly.  You have trouble breathing. MAKE SURE YOU:   Understand these instructions.  Will watch your condition.  Will get help right away if you are not doing well or get worse.   This information is not intended to replace advice given to you by your health care provider. Make sure you discuss any questions you have with your health care provider.   Document Released: 04/30/2005 Document Revised: 09/14/2014 Document Reviewed: 02/20/2013 Elsevier Interactive Patient Education 2016 [employer]  

## 2016-03-21 NOTE — Progress Notes (Signed)
Pre visit review using our clinic review tool, if applicable. No additional management support is needed unless otherwise documented below in the visit note. 

## 2016-03-21 NOTE — Progress Notes (Signed)
Subjective:  Patient ID: April May, female    DOB: December 25, 1955  Age: 60 y.o. MRN: [id number]  CC: Hypertension; Hyperlipidemia; and Diabetes   HPI April May presents for follow-up on the above medical problems and she complains of a several month history of worsening SOB and DOE. She describes DOE with walking, picking up objects, and moving things. This has been intermittent. She denies chest pain, palpitations, near syncope, edema, or fatigue.  Outpatient Medications Prior to Visit  Medication Sig Dispense Refill  . Azilsartan Medoxomil (EDARBI) 80 MG TABS Take 1 tablet (80 mg total) by mouth daily. 90 tablet 3  . clonazePAM (KLONOPIN) 0.5 MG tablet Take 0.5 mg by mouth 2 (two) times daily as needed for anxiety.   2  . fluticasone (FLONASE) 50 MCG/ACT nasal spray Place 2 sprays into both nostrils daily as needed for allergies or rhinitis. 16 g 11  . ibuprofen (ADVIL,MOTRIN) 200 MG tablet Take 400 mg by mouth every 6 (six) hours as needed for headache or moderate pain.    Marland Kitchen lamoTRIgine (LAMICTAL) 200 MG tablet Take 200 mg by mouth at bedtime.    . potassium chloride SA (K-DUR,KLOR-CON) 20 MEQ tablet Take 1 tablet (20 mEq total) by mouth 3 (three) times daily. 90 tablet 5  . triamterene-hydrochlorothiazide (DYAZIDE) 37.5-25 MG capsule Take 1 each (1 capsule total) by mouth daily. 90 capsule 3  . buPROPion (WELLBUTRIN SR) 100 MG 12 hr tablet Take 100 mg by mouth daily.  2  . PREVALITE 4 GM/DOSE powder TAKE 4 GRAMS TWICE A DAY  12   No facility-administered medications prior to visit.     ROS Review of Systems  Objective:  BP 110/70 (BP Location: Left Arm, Patient Position: Sitting, Cuff Size: Normal)   Pulse 81   Temp 98.2 F (36.8 C) (Oral)   Resp 16   Ht 5\' 2"  (1.575 m)   Wt 201 lb (91.2 kg)   LMP 05/14/1988   SpO2 98%   BMI 36.76 kg/m   BP Readings from Last 3 Encounters:  03/21/16 110/70  01/17/16 (!) 107/56  11/17/15 108/62    Wt Readings from  Last 3 Encounters:  03/21/16 201 lb (91.2 kg)  11/17/15 196 lb (88.9 kg)  10/19/15 204 lb (92.5 kg)    Physical Exam  Constitutional: She is oriented to person, place, and time. No distress.  HENT:  Mouth/Throat: Oropharynx is clear and moist. No oropharyngeal exudate.  Eyes: Conjunctivae are normal. Right eye exhibits no discharge. Left eye exhibits no discharge. No scleral icterus.  Neck: Normal range of motion. Neck supple. No JVD present. No tracheal deviation present. No thyromegaly present.  Cardiovascular: Normal rate, regular rhythm, S1 normal, S2 normal and intact distal pulses.  Exam reveals no gallop, no S3, no S4 and no friction rub.   Murmur heard.  Systolic murmur is present with a grade of 1/6   No diastolic murmur is present  Pulses:      Carotid pulses are 1+ on the right side, and 1+ on the left side.      Radial pulses are 1+ on the right side, and 1+ on the left side.       Femoral pulses are 1+ on the right side, and 1+ on the left side.      Popliteal pulses are 1+ on the right side, and 1+ on the left side.       Dorsalis pedis pulses are 1+ on the right side, and  1+ on the left side.       Posterior tibial pulses are 1+ on the right side, and 1+ on the left side.  EKG ----  Sinus  Rhythm  WITHIN NORMAL LIMITS  1/6 SEM over RUSB  Pulmonary/Chest: Effort normal and breath sounds normal. No stridor. No respiratory distress. She has no wheezes. She has no rales. She exhibits no tenderness.  Abdominal: Soft. Bowel sounds are normal. She exhibits no distension and no mass. There is no tenderness. There is no rebound and no guarding.  Musculoskeletal: Normal range of motion. She exhibits no edema, tenderness or deformity.  Lymphadenopathy:    She has no cervical adenopathy.  Neurological: She is oriented to person, place, and time.  Skin: Skin is warm and dry. No rash noted. She is not diaphoretic. No erythema. No pallor.  Vitals reviewed.   Lab Results    Component Value Date   WBC 11.8 (H) 03/21/2016   HGB 13.1 03/21/2016   HCT 39.0 03/21/2016   PLT 307.0 03/21/2016   GLUCOSE 97 03/21/2016   CHOL 102 03/21/2016   TRIG 142.0 03/21/2016   HDL 36.50 (L) 03/21/2016   LDLCALC 37 03/21/2016   ALT 10 03/21/2016   AST 12 03/21/2016   NA 144 03/21/2016   K 4.0 03/21/2016   CL 112 03/21/2016   CREATININE 1.00 03/21/2016   BUN 12 03/21/2016   CO2 26 03/21/2016   TSH 0.61 03/21/2016   HGBA1C 5.7 03/21/2016   MICROALBUR 1.3 02/24/2015    Dg Chest 2 View  Result Date: 10/19/2015 CLINICAL DATA:  Hypertension.  Bilateral leg swelling for 1 week EXAM: CHEST  2 VIEW COMPARISON:  11/26/2014 FINDINGS: Borderline cardiomegaly. Mild atelectasis or scarring at the bases. No edema, effusion, or air leak. Normal aortic and hilar contours. IMPRESSION: Minor atelectasis or scar.  Negative for edema. Electronically Signed   By: Monte Fantasia M.D.   On: 10/19/2015 06:45    Assessment & Plan:   April May was seen today for hypertension, hyperlipidemia and diabetes.  Diagnoses and all orders for this visit:  Need for prophylactic vaccination and inoculation against influenza -     Flu Vaccine QUAD 36+ mos IM  HYPERTENSION, BENIGN ESSENTIAL- Her blood pressure is well-controlled, her EKG is negative for LVH, electrolytes and renal function are stable. -     Comprehensive metabolic panel; Future -     CBC with Differential/Platelet; Future -     EKG 12-Lead  Type 2 diabetes mellitus with complication, without long-term current use of insulin ([place])- her blood sugars are very well controlled. -     Comprehensive metabolic panel; Future -     Hemoglobin A1c; Future  Hypokalemia- improvement noted, will continue potassium replacement therapy. -     Magnesium; Future -     Comprehensive metabolic panel; Future  Pure hypercholesterolemia- her LDL is very well controlled. -     Lipid panel; Future -     TSH; Future  DOE (dyspnea on exertion)- her EKG  is normal, her exam shows no signs of fluid overload, she does have a new onset murmur several ordered an echocardiogram to see if she has aortic stenosis or some other valvular heart disease. I think the shortness of breath is probably related to her COPD. -     EKG 12-Lead -     ECHOCARDIOGRAM COMPLETE; Future  Aortic ejection murmur- as above -     ECHOCARDIOGRAM COMPLETE; Future   I am having Ms. Ewart  maintain her lamoTRIgine, fluticasone, ibuprofen, clonazePAM, PREVALITE, potassium chloride SA, Azilsartan Medoxomil, triamterene-hydrochlorothiazide, and buPROPion.  Meds ordered this encounter  Medications  . buPROPion (WELLBUTRIN XL) 150 MG 24 hr tablet    Sig: TAKE 1 TABLET BY MOUTH ONCE A DAY IN EARLY AM    Refill:  2     Follow-up: Return in about 6 months (around 09/18/2016).  Scarlette Calico, MD

## 2016-03-25 DIAGNOSIS — I351 Nonrheumatic aortic (valve) insufficiency: Secondary | ICD-10-CM | POA: Insufficient documentation

## 2016-04-10 ENCOUNTER — Encounter: Payer: Self-pay | Admitting: Internal Medicine

## 2016-04-17 ENCOUNTER — Ambulatory Visit: Payer: Medicare Other | Admitting: Internal Medicine

## 2016-04-19 ENCOUNTER — Encounter: Payer: Self-pay | Admitting: Internal Medicine

## 2016-04-19 ENCOUNTER — Ambulatory Visit (INDEPENDENT_AMBULATORY_CARE_PROVIDER_SITE_OTHER): Payer: Medicare Other | Admitting: Internal Medicine

## 2016-04-19 ENCOUNTER — Ambulatory Visit (INDEPENDENT_AMBULATORY_CARE_PROVIDER_SITE_OTHER)
Admission: RE | Admit: 2016-04-19 | Discharge: 2016-04-19 | Disposition: A | Discharge status: Home / Self Care | Payer: Medicare Other | Source: Ambulatory Visit | Attending: Internal Medicine | Admitting: Internal Medicine

## 2016-04-19 VITALS — BP 110/68 | HR 69 | Temp 98.2°F | Resp 16 | Ht 62.0 in | Wt 202.2 lb

## 2016-04-19 DIAGNOSIS — R05 Cough: Secondary | ICD-10-CM

## 2016-04-19 DIAGNOSIS — R059 Cough, unspecified: Secondary | ICD-10-CM

## 2016-04-19 DIAGNOSIS — J449 Chronic obstructive pulmonary disease, unspecified: Secondary | ICD-10-CM

## 2016-04-19 DIAGNOSIS — R0602 Shortness of breath: Secondary | ICD-10-CM | POA: Diagnosis not present

## 2016-04-19 LAB — POCT EXHALED NITRIC OXIDE: FeNO level (ppb): <5

## 2016-04-19 MED ORDER — HYDROCODONE-HOMATROPINE 5-1.5 MG/5ML PO SYRP: 5.0000 mL | ORAL_SOLUTION | Freq: Three times a day (TID) | ORAL | 0 refills | Status: DC | PRN

## 2016-04-19 MED ORDER — INDACATEROL-GLYCOPYRROLATE 27.5-15.6 MCG IN CAPS: 1.0000 | ORAL_CAPSULE | Freq: Two times a day (BID) | RESPIRATORY_TRACT | 11 refills | Status: DC

## 2016-04-19 NOTE — Progress Notes (Signed)
Pre visit review using our clinic review tool, if applicable. No additional management support is needed unless otherwise documented below in the visit note. 

## 2016-04-19 NOTE — Progress Notes (Signed)
Subjective:  Patient ID: April May, female    DOB: December 05, 1955  Age: 60 y.o. MRN: [id number]  CC: Cough and COPD   HPI MAYO HORNACEK presents for A 2 week history of cough with shortness of breath, dyspnea on exertion, and wheezing. The cough was initially productive of green phlegm but she has taken a course of a cephalosporin antibiotic and the cough is now productive of clear phlegm. She denies chest pain, hemoptysis, fever, chills, night sweats.  Outpatient Medications Prior to Visit  Medication Sig Dispense Refill  . Azilsartan Medoxomil (EDARBI) 80 MG TABS Take 1 tablet (80 mg total) by mouth daily. 90 tablet 3  . buPROPion (WELLBUTRIN XL) 150 MG 24 hr tablet TAKE 1 TABLET BY MOUTH ONCE A DAY IN EARLY AM  2  . clonazePAM (KLONOPIN) 0.5 MG tablet Take 0.5 mg by mouth 2 (two) times daily as needed for anxiety.   2  . fluticasone (FLONASE) 50 MCG/ACT nasal spray Place 2 sprays into both nostrils daily as needed for allergies or rhinitis. 16 g 11  . ibuprofen (ADVIL,MOTRIN) 200 MG tablet Take 400 mg by mouth every 6 (six) hours as needed for headache or moderate pain.    Marland Kitchen lamoTRIgine (LAMICTAL) 200 MG tablet Take 200 mg by mouth at bedtime.    . potassium chloride SA (K-DUR,KLOR-CON) 20 MEQ tablet Take 1 tablet (20 mEq total) by mouth 3 (three) times daily. 90 tablet 5  . PREVALITE 4 GM/DOSE powder TAKE 4 GRAMS TWICE A DAY  12  . triamterene-hydrochlorothiazide (DYAZIDE) 37.5-25 MG capsule Take 1 each (1 capsule total) by mouth daily. 90 capsule 3   No facility-administered medications prior to visit.     ROS Review of Systems  Constitutional: Negative for activity change, chills, fatigue and fever.  HENT: Negative.  Negative for congestion, facial swelling, sore throat and trouble swallowing.   Eyes: Negative for visual disturbance.  Respiratory: Positive for cough, shortness of breath and wheezing. Negative for chest tightness and stridor.   Cardiovascular:  Negative.  Negative for chest pain, palpitations and leg swelling.  Gastrointestinal: Negative.  Negative for abdominal pain, constipation, diarrhea, nausea and vomiting.  Endocrine: Negative.   Genitourinary: Negative.   Musculoskeletal: Negative.   Skin: Negative.   Allergic/Immunologic: Negative.   Neurological: Negative.  Negative for dizziness and weakness.  Hematological: Negative.  Negative for adenopathy. Does not bruise/bleed easily.  Psychiatric/Behavioral: Negative.     Objective:  BP 110/68 (BP Location: Left Arm, Patient Position: Sitting, Cuff Size: Large)   Pulse 69   Temp 98.2 F (36.8 C) (Oral)   Resp 16   Ht 5\' 2"  ([id number] m)   Wt 202 lb 4 oz (91.7 kg)   LMP 05/14/1988   SpO2 97%   BMI 36.99 kg/m   BP Readings from Last 3 Encounters:  04/19/16 110/68  03/21/16 110/70  01/17/16 (!) 107/56    Wt Readings from Last 3 Encounters:  04/19/16 202 lb 4 oz (91.7 kg)  03/21/16 201 lb (91.2 kg)  11/17/15 196 lb (88.9 kg)    Physical Exam  Constitutional: She is oriented to person, place, and time. No distress.  HENT:  Mouth/Throat: Oropharynx is clear and moist. No oropharyngeal exudate.  Eyes: Conjunctivae are normal. Right eye exhibits no discharge. Left eye exhibits no discharge. No scleral icterus.  Neck: Normal range of motion. Neck supple. No JVD present. No tracheal deviation present. No thyromegaly present.  Cardiovascular: Normal rate, regular rhythm, normal heart sounds  and intact distal pulses.  Exam reveals no gallop and no friction rub.   No murmur heard. Pulmonary/Chest: Effort normal. No accessory muscle usage or stridor. No respiratory distress. She has no decreased breath sounds. She has no wheezes. She has rhonchi in the right lower field and the left lower field. She has no rales. She exhibits no tenderness.  Abdominal: Soft. Bowel sounds are normal. She exhibits no distension and no mass. There is no tenderness. There is no rebound and no  guarding.  Musculoskeletal: Normal range of motion. She exhibits no edema, tenderness or deformity.  Lymphadenopathy:    She has no cervical adenopathy.  Neurological: She is oriented to person, place, and time.  Skin: Skin is warm and dry. No rash noted. She is not diaphoretic. No erythema. No pallor.  Psychiatric: She has a normal mood and affect. Her behavior is normal. Judgment and thought content normal.  Vitals reviewed.   Lab Results  Component Value Date   WBC 11.8 (H) 03/21/2016   HGB 13.1 03/21/2016   HCT 39.0 03/21/2016   PLT 307.0 03/21/2016   GLUCOSE 97 03/21/2016   CHOL 102 03/21/2016   TRIG 142.0 03/21/2016   HDL 36.50 (L) 03/21/2016   LDLCALC 37 03/21/2016   ALT 10 03/21/2016   AST 12 03/21/2016   NA 144 03/21/2016   K 4.0 03/21/2016   CL 112 03/21/2016   CREATININE 1.00 03/21/2016   BUN 12 03/21/2016   CO2 26 03/21/2016   TSH 0.61 03/21/2016   HGBA1C 5.7 03/21/2016   MICROALBUR 1.3 02/24/2015    Dg Chest 2 View  Result Date: 10/19/2015 CLINICAL DATA:  Hypertension.  Bilateral leg swelling for 1 week EXAM: CHEST  2 VIEW COMPARISON:  11/26/2014 FINDINGS: Borderline cardiomegaly. Mild atelectasis or scarring at the bases. No edema, effusion, or air leak. Normal aortic and hilar contours. IMPRESSION: Minor atelectasis or scar.  Negative for edema. Electronically Signed   By: Monte Fantasia M.D.   On: 10/19/2015 06:45  Dg Chest 2 View  Result Date: 04/19/2016 CLINICAL DATA:  Cough, chest congestion, and shortness of breath for the past 2 weeks. History of tobacco use, borderline diabetes, hypertension. EXAM: CHEST  2 VIEW COMPARISON:  PA and lateral chest x-ray of October 19, 2015 FINDINGS: The lungs are mildly hyperinflated but clear. The heart and pulmonary vascularity are normal. The mediastinum is normal in width. There is no pleural effusion. The bony thorax is unremarkable. IMPRESSION: Mild hyperinflation consistent with reactive airway disease or chronic  bronchitic-smoking related changes. There is no pneumonia, CHF, nor other acute cardiopulmonary abnormality. Electronically Signed   By: David  Martinique M.D.   On: 04/19/2016 09:06    Assessment & Plan:   Starlit was seen today for cough and copd.  Diagnoses and all orders for this visit:  COPD with chronic bronchitis ([place])- her FeNO is very low so steroids will not be beneficial for her, I have asked her to start using a LAMA/LABA combination for sx relief and to quit smoking -     POCT EXHALED NITRIC OXIDE -     Indacaterol-Glycopyrrolate (UTIBRON NEOHALER) 27.5-15.6 MCG CAPS; Place 1 Act into inhaler and inhale 2 (two) times daily. -     HYDROcodone-homatropine (HYCODAN) 5-1.5 MG/5ML syrup; Take 5 mLs by mouth every 8 (eight) hours as needed for cough.  Cough- her CXR is neg for PNA or mass, will treat the cough with hycodan -     POCT EXHALED NITRIC OXIDE -  DG Chest 2 View; Future -     HYDROcodone-homatropine (HYCODAN) 5-1.5 MG/5ML syrup; Take 5 mLs by mouth every 8 (eight) hours as needed for cough.   I am having Ms. Barefield start on Indacaterol-Glycopyrrolate and HYDROcodone-homatropine. I am also having her maintain her lamoTRIgine, fluticasone, ibuprofen, clonazePAM, PREVALITE, potassium chloride SA, Azilsartan Medoxomil, triamterene-hydrochlorothiazide, buPROPion, and cefdinir.  Meds ordered this encounter  Medications  . cefdinir (OMNICEF) 300 MG capsule    Sig: TAKE 1 CAPSULE (300 MG TOTAL) BY MOUTH 2 (TWO) TIMES DAILY.    Refill:  1  . Indacaterol-Glycopyrrolate (UTIBRON NEOHALER) 27.5-15.6 MCG CAPS    Sig: Place 1 Act into inhaler and inhale 2 (two) times daily.    Dispense:  60 capsule    Refill:  11  . HYDROcodone-homatropine (HYCODAN) 5-1.5 MG/5ML syrup    Sig: Take 5 mLs by mouth every 8 (eight) hours as needed for cough.    Dispense:  120 mL    Refill:  0     Follow-up: Return in about 3 weeks (around 05/10/2016).  Scarlette Calico, MD

## 2016-04-19 NOTE — Patient Instructions (Signed)
Chronic Obstructive Pulmonary Disease Chronic obstructive pulmonary disease (COPD) is a common lung condition in which airflow from the lungs is limited. COPD is a general term that can be used to describe many different lung problems that limit airflow, including both chronic bronchitis and emphysema. If you have COPD, your lung function will probably never return to normal, but there are measures you can take to improve lung function and make yourself feel better. What are the causes?  Smoking (common).  Exposure to secondhand smoke.  Genetic problems.  Chronic inflammatory lung diseases or recurrent infections. What are the signs or symptoms?  Shortness of breath, especially with physical activity.  Deep, persistent (chronic) cough with a large amount of thick mucus.  Wheezing.  Rapid breaths (tachypnea).  Gray or bluish discoloration (cyanosis) of the skin, especially in your fingers, toes, or lips.  Fatigue.  Weight loss.  Frequent infections or episodes when breathing symptoms become much worse (exacerbations).  Chest tightness. How is this diagnosed? Your health care provider will take a medical history and perform a physical examination to diagnose COPD. Additional tests for COPD may include:  Lung (pulmonary) function tests.  Chest X-ray.  CT scan.  Blood tests. How is this treated? Treatment for COPD may include:  Inhaler and nebulizer medicines. These help manage the symptoms of COPD and make your breathing more comfortable.  Supplemental oxygen. Supplemental oxygen is only helpful if you have a low oxygen level in your blood.  Exercise and physical activity. These are beneficial for nearly all people with COPD.  Lung surgery or transplant.  Nutrition therapy to gain weight, if you are underweight.  Pulmonary rehabilitation. This may involve working with a team of health care providers and specialists, such as respiratory, occupational, and physical  therapists. Follow these instructions at home:  Take all medicines (inhaled or pills) as directed by your health care provider.  Avoid over-the-counter medicines or cough syrups that dry up your airway (such as antihistamines) and slow down the elimination of secretions unless instructed otherwise by your health care provider.  If you are a smoker, the most important thing that you can do is stop smoking. Continuing to smoke will cause further lung damage and breathing trouble. Ask your health care provider for help with quitting smoking. He or she can direct you to community resources or hospitals that provide support.  Avoid exposure to irritants such as smoke, chemicals, and fumes that aggravate your breathing.  Use oxygen therapy and pulmonary rehabilitation if directed by your health care provider. If you require home oxygen therapy, ask your health care provider whether you should purchase a pulse oximeter to measure your oxygen level at home.  Avoid contact with individuals who have a contagious illness.  Avoid extreme temperature and humidity changes.  Eat healthy foods. Eating smaller, more frequent meals and resting before meals may help you maintain your strength.  Stay active, but balance activity with periods of rest. Exercise and physical activity will help you maintain your ability to do things you want to do.  Preventing infection and hospitalization is very important when you have COPD. Make sure to receive all the vaccines your health care provider recommends, especially the pneumococcal and influenza vaccines. Ask your health care provider whether you need a pneumonia vaccine.  Learn and use relaxation techniques to manage stress.  Learn and use controlled breathing techniques as directed by your health care provider. Controlled breathing techniques include: 1. Pursed lip breathing. Start by breathing in (inhaling)   through your nose for 1 second. Then, purse your lips as  if you were going to whistle and breathe out (exhale) through the pursed lips for 2 seconds. 2. Diaphragmatic breathing. Start by putting one hand on your abdomen just above your waist. Inhale slowly through your nose. The hand on your abdomen should move out. Then purse your lips and exhale slowly. You should be able to feel the hand on your abdomen moving in as you exhale.  Learn and use controlled coughing to clear mucus from your lungs. Controlled coughing is a series of short, progressive coughs. The steps of controlled coughing are: 1. Lean your head slightly forward. 2. Breathe in deeply using diaphragmatic breathing. 3. Try to hold your breath for 3 seconds. 4. Keep your mouth slightly open while coughing twice. 5. Spit any mucus out into a tissue. 6. Rest and repeat the steps once or twice as needed. Contact a health care provider if:  You are coughing up more mucus than usual.  There is a change in the color or thickness of your mucus.  Your breathing is more labored than usual.  Your breathing is faster than usual. Get help right away if:  You have shortness of breath while you are resting.  You have shortness of breath that prevents you from:  Being able to talk.  Performing your usual physical activities.  You have chest pain lasting longer than 5 minutes.  Your skin color is more cyanotic than usual.  You measure low oxygen saturations for longer than 5 minutes with a pulse oximeter. This information is not intended to replace advice given to you by your health care provider. Make sure you discuss any questions you have with your health care provider. Document Released: 02/07/2005 Document Revised: 10/06/2015 Document Reviewed: 12/25/2012 Elsevier Interactive Patient Education  2017 [employer]  

## 2016-04-24 ENCOUNTER — Telehealth: Payer: Self-pay

## 2016-04-24 NOTE — Telephone Encounter (Signed)
She has a coupon to try it for free

## 2016-04-24 NOTE — Telephone Encounter (Signed)
Fax from pharmacy states that the utibron neohaler is not covered. Do you want to change to an alternative?

## 2016-04-25 NOTE — Telephone Encounter (Signed)
Pt informed regarding coupon. Pt stated understanding. Pt is not sure if she will be able to continue after the coupon and samples are used.

## 2016-05-02 ENCOUNTER — Encounter: Payer: Self-pay | Admitting: Internal Medicine

## 2016-05-03 ENCOUNTER — Other Ambulatory Visit: Payer: Self-pay | Admitting: Internal Medicine

## 2016-05-08 NOTE — Telephone Encounter (Signed)
Will you call pt and have her schedule another appt.

## 2016-05-10 NOTE — Telephone Encounter (Signed)
Phet made pt an appt.

## 2016-05-22 ENCOUNTER — Ambulatory Visit: Payer: Medicare Other | Admitting: Internal Medicine

## 2016-06-20 ENCOUNTER — Telehealth: Payer: Self-pay | Admitting: Internal Medicine

## 2016-06-20 ENCOUNTER — Other Ambulatory Visit: Payer: Self-pay | Admitting: Internal Medicine

## 2016-06-20 NOTE — Telephone Encounter (Signed)
Patient is requesting Dr. Ronnald Ramp to give her samples of her BP med.

## 2016-06-22 NOTE — Telephone Encounter (Signed)
You have the Edarby 80 mg. Is it okay to give to pt?

## 2016-06-23 NOTE — Telephone Encounter (Signed)
yes

## 2016-06-25 ENCOUNTER — Encounter: Payer: Self-pay | Admitting: Internal Medicine

## 2016-06-25 ENCOUNTER — Other Ambulatory Visit (INDEPENDENT_AMBULATORY_CARE_PROVIDER_SITE_OTHER): Payer: Medicare Other

## 2016-06-25 ENCOUNTER — Ambulatory Visit (INDEPENDENT_AMBULATORY_CARE_PROVIDER_SITE_OTHER): Payer: Medicare Other | Admitting: Internal Medicine

## 2016-06-25 VITALS — BP 130/78 | HR 78 | Temp 98.1°F | Resp 16 | Wt 208.8 lb

## 2016-06-25 DIAGNOSIS — I10 Essential (primary) hypertension: Secondary | ICD-10-CM

## 2016-06-25 DIAGNOSIS — J449 Chronic obstructive pulmonary disease, unspecified: Secondary | ICD-10-CM

## 2016-06-25 DIAGNOSIS — G4730 Sleep apnea, unspecified: Secondary | ICD-10-CM | POA: Diagnosis not present

## 2016-06-25 DIAGNOSIS — E876 Hypokalemia: Secondary | ICD-10-CM | POA: Diagnosis not present

## 2016-06-25 LAB — COMPREHENSIVE METABOLIC PANEL
ALBUMIN: 4.1 g/dL (ref 3.5–5.2)
ALK PHOS: 78 U/L (ref 39–117)
ALT: 10 U/L (ref 0–35)
AST: 12 U/L (ref 0–37)
BILIRUBIN TOTAL: 0.3 mg/dL (ref 0.2–1.2)
BUN: 9 mg/dL (ref 6–23)
CO2: 31 mEq/L (ref 19–32)
Calcium: 9.7 mg/dL (ref 8.4–10.5)
Chloride: 102 mEq/L (ref 96–112)
Creatinine, Ser: 0.85 mg/dL (ref 0.40–1.20)
GFR: 87.54 mL/min (ref >60.00)
GLUCOSE: 110 mg/dL — AB (ref 70–99)
POTASSIUM: 2.9 meq/L — AB (ref 3.5–5.1)
SODIUM: 141 meq/L (ref 135–145)
TOTAL PROTEIN: 7.2 g/dL (ref 6.0–8.3)

## 2016-06-25 LAB — CBC WITH DIFFERENTIAL/PLATELET
BASOS ABS: 0.1 10*3/uL (ref 0.0–0.1)
Basophils Relative: 0.5 % (ref 0.0–3.0)
EOS ABS: 0.2 10*3/uL (ref 0.0–0.7)
EOS PCT: 2 % (ref 0.0–5.0)
HCT: 41.8 % (ref 36.0–46.0)
HEMOGLOBIN: 13.8 g/dL (ref 12.0–15.0)
LYMPHS ABS: 3.4 10*3/uL (ref 0.7–4.0)
Lymphocytes Relative: 33.1 % (ref 12.0–46.0)
MCHC: 33.1 g/dL (ref 30.0–36.0)
MCV: 94 fl (ref 78.0–100.0)
MONO ABS: 0.8 10*3/uL (ref 0.1–1.0)
Monocytes Relative: 7.9 % (ref 3.0–12.0)
NEUTROS PCT: 56.5 % (ref 43.0–77.0)
Neutro Abs: 5.8 10*3/uL (ref 1.4–7.7)
Platelets: 341 10*3/uL (ref 150.0–400.0)
RBC: 4.44 Mil/uL (ref 3.87–5.11)
RDW: 13.9 % (ref 11.5–15.5)
WBC: 10.3 10*3/uL (ref 4.0–10.5)

## 2016-06-25 LAB — TSH: TSH: 0.75 u[IU]/mL (ref 0.35–4.50)

## 2016-06-25 MED ORDER — INDACATEROL-GLYCOPYRROLATE 27.5-15.6 MCG IN CAPS: 1.0000 | ORAL_CAPSULE | Freq: Two times a day (BID) | RESPIRATORY_TRACT | 11 refills | Status: DC

## 2016-06-25 MED ORDER — POTASSIUM CHLORIDE CRYS ER 20 MEQ PO TBCR: 20.0000 meq | EXTENDED_RELEASE_TABLET | Freq: Three times a day (TID) | ORAL | 5 refills | Status: DC

## 2016-06-25 NOTE — Patient Instructions (Signed)
Hypertension Hypertension, commonly called high blood pressure, is when the force of blood pumping through your arteries is too strong. Your arteries are the blood vessels that carry blood from your heart throughout your body. A blood pressure reading consists of a higher number over a lower number, such as 110/72. The higher number (systolic) is the pressure inside your arteries when your heart pumps. The lower number (diastolic) is the pressure inside your arteries when your heart relaxes. Ideally you want your blood pressure below 120/80. Hypertension forces your heart to work harder to pump blood. Your arteries may become narrow or stiff. Having untreated or uncontrolled hypertension can cause heart attack, stroke, kidney disease, and other problems. What increases the risk? Some risk factors for high blood pressure are controllable. Others are not. Risk factors you cannot control include:  Race. You may be at higher risk if you are African American.  Age. Risk increases with age.  Gender. Men are at higher risk than women before age 45 years. After age 65, women are at higher risk than men. Risk factors you can control include:  Not getting enough exercise or physical activity.  Being overweight.  Getting too much fat, sugar, calories, or salt in your diet.  Drinking too much alcohol. What are the signs or symptoms? Hypertension does not usually cause signs or symptoms. Extremely high blood pressure (hypertensive crisis) may cause headache, anxiety, shortness of breath, and nosebleed. How is this diagnosed? To check if you have hypertension, your health care provider will measure your blood pressure while you are seated, with your arm held at the level of your heart. It should be measured at least twice using the same arm. Certain conditions can cause a difference in blood pressure between your right and left arms. A blood pressure reading that is higher than normal on one occasion does  not mean that you need treatment. If it is not clear whether you have high blood pressure, you may be asked to return on a different day to have your blood pressure checked again. Or, you may be asked to monitor your blood pressure at home for 1 or more weeks. How is this treated? Treating high blood pressure includes making lifestyle changes and possibly taking medicine. Living a healthy lifestyle can help lower high blood pressure. You may need to change some of your habits. Lifestyle changes may include:  Following the DASH diet. This diet is high in fruits, vegetables, and whole grains. It is low in salt, red meat, and added sugars.  Keep your sodium intake below 2,300 mg per day.  Getting at least 30-45 minutes of aerobic exercise at least 4 times per week.  Losing weight if necessary.  Not smoking.  Limiting alcoholic beverages.  Learning ways to reduce stress. Your health care provider may prescribe medicine if lifestyle changes are not enough to get your blood pressure under control, and if one of the following is true:  You are 18-59 years of age and your systolic blood pressure is above 140.  You are 60 years of age or older, and your systolic blood pressure is above 150.  Your diastolic blood pressure is above 90.  You have diabetes, and your systolic blood pressure is over 140 or your diastolic blood pressure is over 90.  You have kidney disease and your blood pressure is above 140/90.  You have heart disease and your blood pressure is above 140/90. Your personal target blood pressure may vary depending on your medical   conditions, your age, and other factors. Follow these instructions at home:  Have your blood pressure rechecked as directed by your health care provider.  Take medicines only as directed by your health care provider. Follow the directions carefully. Blood pressure medicines must be taken as prescribed. The medicine does not work as well when you skip  doses. Skipping doses also puts you at risk for problems.  Do not smoke.  Monitor your blood pressure at home as directed by your health care provider. Contact a health care provider if:  You think you are having a reaction to medicines taken.  You have recurrent headaches or feel dizzy.  You have swelling in your ankles.  You have trouble with your vision. Get help right away if:  You develop a severe headache or confusion.  You have unusual weakness, numbness, or feel faint.  You have severe chest or abdominal pain.  You vomit repeatedly.  You have trouble breathing. This information is not intended to replace advice given to you by your health care provider. Make sure you discuss any questions you have with your health care provider. Document Released: 04/30/2005 Document Revised: 10/06/2015 Document Reviewed: 02/20/2013 Elsevier Interactive Patient Education  2017 [employer]  

## 2016-06-25 NOTE — Progress Notes (Signed)
Pre visit review using our clinic review tool, if applicable. No additional management support is needed unless otherwise documented below in the visit note. 

## 2016-06-25 NOTE — Progress Notes (Signed)
Subjective:  Patient ID: April May, female    DOB: [birth year]  Age: 61 y.o. MRN: [id number]  CC: Hypertension   HPI April May presents for a blood pressure check. She tells me her blood pressure has been well controlled. She has a history of sleep apnea that is not being treated. She has occasional episodes of wheezing but denies any recent cough or shortness of breath. She is not using the LAMA/LABA inhaler that was prescribed a month or 2 ago.  Outpatient Medications Prior to Visit  Medication Sig Dispense Refill  . Azilsartan Medoxomil (EDARBI) 80 MG TABS Take 1 tablet (80 mg total) by mouth daily. 90 tablet 3  . buPROPion (WELLBUTRIN XL) 150 MG 24 hr tablet TAKE 1 TABLET BY MOUTH ONCE A DAY IN EARLY AM  2  . clonazePAM (KLONOPIN) 0.5 MG tablet Take 0.5 mg by mouth 2 (two) times daily as needed for anxiety.   2  . fluticasone (FLONASE) 50 MCG/ACT nasal spray Place 2 sprays into both nostrils daily as needed for allergies or rhinitis. 16 g 11  . ibuprofen (ADVIL,MOTRIN) 200 MG tablet Take 400 mg by mouth every 6 (six) hours as needed for headache or moderate pain.    Marland Kitchen lamoTRIgine (LAMICTAL) 200 MG tablet Take 200 mg by mouth at bedtime.    Marland Kitchen PREVALITE 4 GM/DOSE powder TAKE 4 GRAMS TWICE A DAY  12  . triamterene-hydrochlorothiazide (DYAZIDE) 37.5-25 MG capsule Take 1 each (1 capsule total) by mouth daily. 90 capsule 3  . Indacaterol-Glycopyrrolate (UTIBRON NEOHALER) 27.5-15.6 MCG CAPS Place 1 Act into inhaler and inhale 2 (two) times daily. 60 capsule 11  . potassium chloride SA (K-DUR,KLOR-CON) 20 MEQ tablet Take 1 tablet (20 mEq total) by mouth 3 (three) times daily. 90 tablet 5  . cefdinir (OMNICEF) 300 MG capsule TAKE 1 CAPSULE (300 MG TOTAL) BY MOUTH 2 (TWO) TIMES DAILY.  1  . HYDROcodone-homatropine (HYCODAN) 5-1.5 MG/5ML syrup Take 5 mLs by mouth every 8 (eight) hours as needed for cough. (Patient not taking: Reported on 06/25/2016) 120 mL 0   No  facility-administered medications prior to visit.     ROS Review of Systems  Constitutional: Positive for fatigue and unexpected weight change. Negative for appetite change, chills, diaphoresis and fever.       She complains of fatigue, weight gain, and excessive daytime sleepiness  HENT: Negative.  Negative for facial swelling, sinus pressure and trouble swallowing.   Eyes: Negative for visual disturbance.  Respiratory: Positive for shortness of breath and wheezing. Negative for cough, chest tightness and stridor.   Cardiovascular: Negative for chest pain, palpitations and leg swelling.  Gastrointestinal: Negative for abdominal pain, constipation, diarrhea, nausea and vomiting.  Endocrine: Negative.   Genitourinary: Negative.  Negative for difficulty urinating.  Musculoskeletal: Negative.   Allergic/Immunologic: Negative.   Neurological: Negative.  Negative for dizziness, weakness and numbness.  Hematological: Negative for adenopathy. Does not bruise/bleed easily.  Psychiatric/Behavioral: Negative.     Objective:  BP 130/78   Pulse 78   Temp 98.1 F (36.7 C) (Oral)   Resp 16   Wt 208 lb 12 oz (94.7 kg)   LMP 05/14/1988   SpO2 97%   BMI 38.18 kg/m   BP Readings from Last 3 Encounters:  06/25/16 130/78  04/19/16 110/68  03/21/16 110/70    Wt Readings from Last 3 Encounters:  06/25/16 208 lb 12 oz (94.7 kg)  04/19/16 202 lb 4 oz (91.7 kg)  03/21/16 201 lb (  91.2 kg)    Physical Exam  Constitutional: She is oriented to person, place, and time. No distress.  HENT:  Mouth/Throat: Oropharynx is clear and moist. No oropharyngeal exudate.  Eyes: Conjunctivae are normal. Right eye exhibits no discharge. Left eye exhibits no discharge. No scleral icterus.  Neck: Normal range of motion. Neck supple. No JVD present. No tracheal deviation present. No thyromegaly present.  Cardiovascular: Normal rate, regular rhythm, normal heart sounds and intact distal pulses.  Exam reveals no  gallop and no friction rub.   No murmur heard. Pulmonary/Chest: Effort normal and breath sounds normal. No stridor. No respiratory distress. She has no wheezes. She has no rales. She exhibits no tenderness.  Abdominal: Soft. Bowel sounds are normal. She exhibits no distension and no mass. There is no tenderness. There is no rebound and no guarding.  Musculoskeletal: Normal range of motion. She exhibits no edema, tenderness or deformity.  Lymphadenopathy:    She has no cervical adenopathy.  Neurological: She is oriented to person, place, and time.  Skin: Skin is warm and dry. No rash noted. She is not diaphoretic. No erythema. No pallor.  Vitals reviewed.   Lab Results  Component Value Date   WBC 10.3 06/25/2016   HGB 13.8 06/25/2016   HCT 41.8 06/25/2016   PLT 341.0 06/25/2016   GLUCOSE 110 (H) 06/25/2016   CHOL 102 03/21/2016   TRIG 142.0 03/21/2016   HDL 36.50 (L) 03/21/2016   LDLCALC 37 03/21/2016   ALT 10 06/25/2016   AST 12 06/25/2016   NA 141 06/25/2016   K 2.9 (L) 06/25/2016   CL 102 06/25/2016   CREATININE 0.85 06/25/2016   BUN 9 06/25/2016   CO2 31 06/25/2016   TSH 0.75 06/25/2016   HGBA1C 5.7 03/21/2016   MICROALBUR 1.3 02/24/2015    Dg Chest 2 View  Result Date: 04/19/2016 CLINICAL DATA:  Cough, chest congestion, and shortness of breath for the past 2 weeks. History of tobacco use, borderline diabetes, hypertension. EXAM: CHEST  2 VIEW COMPARISON:  PA and lateral chest x-ray of October 19, 2015 FINDINGS: The lungs are mildly hyperinflated but clear. The heart and pulmonary vascularity are normal. The mediastinum is normal in width. There is no pleural effusion. The bony thorax is unremarkable. IMPRESSION: Mild hyperinflation consistent with reactive airway disease or chronic bronchitic-smoking related changes. There is no pneumonia, CHF, nor other acute cardiopulmonary abnormality. Electronically Signed   By: David  Martinique M.D.   On: 04/19/2016 09:06    Assessment &  Plan:   Reese was seen today for hypertension.  Diagnoses and all orders for this visit:  COPD with chronic bronchitis ([place])- she was asked to quit smoking and to use the LABA/LAMA inhaler consistently. -     Indacaterol-Glycopyrrolate (UTIBRON NEOHALER) 27.5-15.6 MCG CAPS; Place 1 Act into inhaler and inhale 2 (two) times daily.  Sleep apnea, unspecified type- I recommended that she follow up with sleep medicine and consider restarting CPAP -     Ambulatory referral to Pulmonology  HYPERTENSION, BENIGN ESSENTIAL- her blood pressure is adequately well controlled, will replace the hypokalemia induced by thiazide diuretic. -     Comprehensive metabolic panel; Future -     CBC with Differential/Platelet; Future -     TSH; Future -     potassium chloride SA (K-DUR,KLOR-CON) 20 MEQ tablet; Take 1 tablet (20 mEq total) by mouth 3 (three) times daily.  Hypokalemia -     potassium chloride SA (K-DUR,KLOR-CON) 20 MEQ tablet; Take  1 tablet (20 mEq total) by mouth 3 (three) times daily.   I have discontinued Ms. Lanphear's cefdinir and HYDROcodone-homatropine. I am also having her maintain her lamoTRIgine, fluticasone, ibuprofen, clonazePAM, PREVALITE, Azilsartan Medoxomil, triamterene-hydrochlorothiazide, buPROPion, Indacaterol-Glycopyrrolate, and potassium chloride SA.  Meds ordered this encounter  Medications  . Indacaterol-Glycopyrrolate (UTIBRON NEOHALER) 27.5-15.6 MCG CAPS    Sig: Place 1 Act into inhaler and inhale 2 (two) times daily.    Dispense:  60 capsule    Refill:  11  . potassium chloride SA (K-DUR,KLOR-CON) 20 MEQ tablet    Sig: Take 1 tablet (20 mEq total) by mouth 3 (three) times daily.    Dispense:  90 tablet    Refill:  5     Follow-up: Return in about 6 months (around 12/23/2016).  Scarlette Calico, MD

## 2016-06-27 NOTE — Telephone Encounter (Signed)
Pt has picked up samples.  

## 2016-07-26 ENCOUNTER — Encounter (HOSPITAL_COMMUNITY): Payer: Self-pay

## 2016-07-26 DIAGNOSIS — K5000 Crohn's disease of small intestine without complications: Secondary | ICD-10-CM | POA: Diagnosis not present

## 2016-07-26 DIAGNOSIS — J449 Chronic obstructive pulmonary disease, unspecified: Secondary | ICD-10-CM | POA: Insufficient documentation

## 2016-07-26 DIAGNOSIS — F17210 Nicotine dependence, cigarettes, uncomplicated: Secondary | ICD-10-CM | POA: Insufficient documentation

## 2016-07-26 DIAGNOSIS — R109 Unspecified abdominal pain: Secondary | ICD-10-CM | POA: Diagnosis not present

## 2016-07-26 DIAGNOSIS — R1084 Generalized abdominal pain: Secondary | ICD-10-CM | POA: Diagnosis present

## 2016-07-26 DIAGNOSIS — Z79899 Other long term (current) drug therapy: Secondary | ICD-10-CM | POA: Diagnosis not present

## 2016-07-26 DIAGNOSIS — E119 Type 2 diabetes mellitus without complications: Secondary | ICD-10-CM | POA: Insufficient documentation

## 2016-07-26 DIAGNOSIS — I10 Essential (primary) hypertension: Secondary | ICD-10-CM | POA: Diagnosis not present

## 2016-07-26 DIAGNOSIS — K5090 Crohn's disease, unspecified, without complications: Secondary | ICD-10-CM | POA: Diagnosis not present

## 2016-07-26 LAB — CBC
HEMATOCRIT: 42.6 % (ref 36.0–46.0)
Hemoglobin: 14 g/dL (ref 12.0–15.0)
MCH: 31.3 pg (ref 26.0–34.0)
MCHC: 32.9 g/dL (ref 30.0–36.0)
MCV: 95.1 fL (ref 78.0–100.0)
PLATELETS: 321 10*3/uL (ref 150–400)
RBC: 4.48 MIL/uL (ref 3.87–5.11)
RDW: 14.2 % (ref 11.5–15.5)
WBC: 12.3 10*3/uL — AB (ref 4.0–10.5)

## 2016-07-26 LAB — URINALYSIS, ROUTINE W REFLEX MICROSCOPIC
BILIRUBIN URINE: NEGATIVE
GLUCOSE, UA: NEGATIVE mg/dL
Hgb urine dipstick: NEGATIVE
KETONES UR: NEGATIVE mg/dL
Leukocytes, UA: NEGATIVE
NITRITE: NEGATIVE
PH: 5 (ref 5.0–8.0)
PROTEIN: NEGATIVE mg/dL
Specific Gravity, Urine: 1.009 (ref 1.005–1.030)

## 2016-07-26 NOTE — ED Triage Notes (Signed)
Pt reports RLQ and LLQ pan that wraps around sides to back. Denies n/v. Pt reports she has has chronic diarrhea from prior sx but has had changes in bowel habits over the last several days. She reports foamy stool and bloating. PT has hx of chrones

## 2016-07-27 ENCOUNTER — Encounter: Payer: Self-pay | Admitting: Internal Medicine

## 2016-07-27 ENCOUNTER — Encounter (HOSPITAL_COMMUNITY): Payer: Self-pay | Admitting: Radiology

## 2016-07-27 ENCOUNTER — Emergency Department (HOSPITAL_COMMUNITY): Payer: Medicare Other

## 2016-07-27 ENCOUNTER — Emergency Department (HOSPITAL_COMMUNITY)
Admission: EM | Admit: 2016-07-27 | Discharge: 2016-07-27 | Disposition: A | Discharge status: Home / Self Care | Payer: Medicare Other | Attending: Emergency Medicine | Admitting: Emergency Medicine

## 2016-07-27 DIAGNOSIS — R109 Unspecified abdominal pain: Secondary | ICD-10-CM | POA: Diagnosis not present

## 2016-07-27 DIAGNOSIS — K5010 Crohn's disease of large intestine without complications: Secondary | ICD-10-CM

## 2016-07-27 LAB — COMPREHENSIVE METABOLIC PANEL
ALK PHOS: 71 U/L (ref 38–126)
ALT: 17 U/L (ref 14–54)
ANION GAP: 7 (ref 5–15)
AST: 23 U/L (ref 15–41)
Albumin: 3.9 g/dL (ref 3.5–5.0)
BILIRUBIN TOTAL: 0.8 mg/dL (ref 0.3–1.2)
BUN: 8 mg/dL (ref 6–20)
CALCIUM: 9.9 mg/dL (ref 8.9–10.3)
CO2: 26 mmol/L (ref 22–32)
Chloride: 108 mmol/L (ref 101–111)
Creatinine, Ser: 0.87 mg/dL (ref 0.44–1.00)
GLUCOSE: 101 mg/dL — AB (ref 65–99)
Potassium: 5.6 mmol/L — ABNORMAL HIGH (ref 3.5–5.1)
Sodium: 141 mmol/L (ref 135–145)
TOTAL PROTEIN: 7.2 g/dL (ref 6.5–8.1)

## 2016-07-27 LAB — LIPASE, BLOOD: Lipase: 26 U/L (ref 11–51)

## 2016-07-27 MED ORDER — OXYCODONE-ACETAMINOPHEN 5-325 MG PO TABS: 1.0000 | ORAL_TABLET | Freq: Three times a day (TID) | ORAL | 0 refills | Status: DC | PRN

## 2016-07-27 MED ORDER — IOPAMIDOL (ISOVUE-300) INJECTION 61%
INTRAVENOUS | Status: AC
  Administered 2016-07-27: 100 mL
  Filled 2016-07-27: qty 100

## 2016-07-27 MED ORDER — HYDROMORPHONE HCL 1 MG/ML IJ SOLN
1.0000 mg | Freq: Once | INTRAMUSCULAR | Status: AC
  Administered 2016-07-27: 1 mg via INTRAVENOUS
  Filled 2016-07-27: qty 1

## 2016-07-27 MED ORDER — IOPAMIDOL (ISOVUE-300) INJECTION 61%
INTRAVENOUS | Status: AC
  Filled 2016-07-27: qty 30

## 2016-07-27 MED ORDER — PREDNISONE 10 MG PO TABS: 40.0000 mg | ORAL_TABLET | Freq: Every day | ORAL | 0 refills | Status: DC

## 2016-07-27 MED ORDER — SODIUM CHLORIDE 0.9 % IV BOLUS (SEPSIS)
1000.0000 mL | Freq: Once | INTRAVENOUS | Status: AC
  Administered 2016-07-27: 1000 mL via INTRAVENOUS

## 2016-07-27 NOTE — ED Provider Notes (Signed)
[facility] DEPT Provider Note   CSN: [id number] Arrival date & time: 07/26/16  2231  By signing my name below, I, Delton Prairie, attest that this documentation has been prepared under the direction and in the presence of Varney Biles, MD  Electronically Signed: Delton Prairie, ED Scribe. 07/27/16. 3:50 AM.   History   Chief Complaint Chief Complaint  Patient presents with  . Abdominal Pain   The history is provided by the patient. No language interpreter was used.   HPI Comments:  April May is a 61 y.o. female, with a PMHx of Crohn's disease, HTN and DM, who presents to the Emergency Department complaining of acute onset, intermittent, "7/10" generalized abdominal pain which is worse in the lower abdomen that began 1 week ago. She notes her pain became constant today. Pt also reports 4-5 episodes of diarrhea daily which are different from her normal bowel movements. She states her bowel movement have been more "bubbly" and notes she feels as if more stool will come out but does not. No alleviating factors noted. Pt denies nausea, vomiting or any other associated symptoms.   Past Medical History:  Diagnosis Date  . Allergy   . Crohn disease ([place])   . Depression   . Diabetes mellitus type II    borderline, md checks hemaglobin A 1 c at md office   . Hypertension   . Sleep apnea    does not use cpap since weight loss 1 and 1/2 years ago    Patient Active Problem List   Diagnosis Date Noted  . Aortic ejection murmur 03/25/2016  . DOE (dyspnea on exertion) 03/21/2016  . Callus of foot 10/04/2015  . Crohn's disease of both small and large intestine with fistula ([place]) 11/24/2014  . Crohn's regional enteritis ([place]) 05/20/2014  . COPD with chronic bronchitis ([place]) 03/11/2014  . Hypokalemia 01/22/2013  . Pure hypercholesterolemia 06/13/2011  . Sleep apnea 04/27/2011  . Type II diabetes mellitus with manifestations ([place]) 03/01/2011  . Obesity, Class II, BMI 35-39.9,  with comorbidity 04/01/2009  . TOBACCO ABUSE 02/24/2008  . ALLERGIC RHINITIS 02/24/2008  . DEPRESSION 08/26/2007  . HYPERTENSION, BENIGN ESSENTIAL 08/04/2007    Past Surgical History:  Procedure Laterality Date  . ABDOMINAL HYSTERECTOMY     1 ovary removed  . APPENDECTOMY    . APPLICATION OF WOUND VAC  11/24/2014   Procedure: APPLICATION OF WOUND VAC;  Surgeon: Donnie Mesa, MD;  Location: [facility] ORS;  Service: General;;  . BOWEL RESECTION N/A 11/24/2014   Procedure: SMALL BOWEL RESECTION;  Surgeon: Donnie Mesa, MD;  Location: [facility];  Service: General;  Laterality: N/A;  . BREAST SURGERY Right 40 yrs ago   benign tumor removed  . LAPAROTOMY N/A 11/24/2014   Procedure: EXPLORATORY LAPAROTOMY EXTENSIVE LYSIS OF ADHESIONS SAMLL BOWEL RESECTION RIGHT HEMI COLECTOMY WOUND VAC APPLICATION. ;  Surgeon: Donnie Mesa, MD;  Location: [facility];  Service: General;  Laterality: N/A;  . LYSIS OF ADHESION  11/24/2014   Procedure: LYSIS OF ADHESION (3 HRS);  Surgeon: Donnie Mesa, MD;  Location: [facility];  Service: General;;  . PARTIAL COLECTOMY  11/24/2014   Procedure: RIGHT HEMI COLECTOMY;  Surgeon: Donnie Mesa, MD;  Location: [facility];  Service: General;;  . SMALL INTESTINE SURGERY  age 32    OB History    No data available       Home Medications    Prior to Admission medications   Medication Sig Start Date End Date Taking? Authorizing Provider  Azilsartan Medoxomil (EDARBI) 80 MG TABS Take 1 tablet (80 mg total) by mouth daily. 11/18/15  Yes Janith Lima, MD  buPROPion (WELLBUTRIN XL) 150 MG 24 hr tablet TAKE 1 TABLET BY MOUTH ONCE A DAY IN EARLY AM 01/15/16  Yes Historical Provider, MD  clonazePAM (KLONOPIN) 0.5 MG tablet Take 0.5 mg by mouth 2 (two) times daily as needed for anxiety.  02/15/15  Yes Historical Provider, MD  fluticasone (FLONASE) 50 MCG/ACT nasal spray Place 2 sprays into both nostrils daily as needed for allergies or rhinitis. 02/24/15  Yes Janith Lima, MD  ibuprofen  (ADVIL,MOTRIN) 200 MG tablet Take 400 mg by mouth every 6 (six) hours as needed for headache or moderate pain.   Yes Historical Provider, MD  Indacaterol-Glycopyrrolate (UTIBRON NEOHALER) 27.5-15.6 MCG CAPS Place 1 Act into inhaler and inhale 2 (two) times daily. Patient taking differently: Place 1 Act into inhaler and inhale 2 (two) times daily as needed (shortness of breath).  06/25/16  Yes Janith Lima, MD  lamoTRIgine (LAMICTAL) 200 MG tablet Take 200 mg by mouth at bedtime.   Yes Historical Provider, MD  potassium chloride SA (K-DUR,KLOR-CON) 20 MEQ tablet Take 1 tablet (20 mEq total) by mouth 3 (three) times daily. Patient taking differently: Take 20 mEq by mouth daily.  06/25/16  Yes Janith Lima, MD  triamterene-hydrochlorothiazide (DYAZIDE) 37.5-25 MG capsule Take 1 each (1 capsule total) by mouth daily. 11/22/15  Yes Janith Lima, MD  oxyCODONE-acetaminophen (PERCOCET/ROXICET) 5-325 MG tablet Take 1 tablet by mouth every 8 (eight) hours as needed for severe pain. 07/27/16   Varney Biles, MD  predniSONE (DELTASONE) 10 MG tablet Take 4 tablets (40 mg total) by mouth daily. 07/27/16   Varney Biles, MD    Family History Family History  Problem Relation Age of Onset  . Hypertension Mother   . Arthritis Father   . Alcohol abuse Paternal Uncle   . Diabetes Paternal Grandmother   . Stroke Paternal Grandmother     Social History Social History  Substance Use Topics  . Smoking status: Current Every Day Smoker    Packs/day: 1.00    Years: 35.00    Types: Cigarettes  . Smokeless tobacco: Never Used  . Alcohol use No     Allergies   Azathioprine; Crestor [rosuvastatin calcium]; Lipitor [atorvastatin calcium]; and Lisinopril   Review of Systems Review of Systems 10 systems reviewed and all are negative for acute change except as noted in the HPI.  Physical Exam Updated Vital Signs BP 100/65   Pulse 62   Temp 98.8 F (37.1 C) (Oral)   Resp 19   Ht 5' 2"  (1.575 m)   Wt  211 lb (95.7 kg)   LMP 05/14/1988   SpO2 96%   BMI 38.59 kg/m   Physical Exam  Constitutional: She is oriented to person, place, and time. She appears well-developed and well-nourished. No distress.  HENT:  Head: Normocephalic and atraumatic.  Eyes: EOM are normal.  Neck: Normal range of motion.  Cardiovascular: Normal rate, regular rhythm and normal heart sounds.   Pulmonary/Chest: Effort normal and breath sounds normal.  Abdominal: Soft. She exhibits no distension. There is tenderness. There is no rebound and no guarding.  Pt has generalized tenderness, worst over the RUQ and lower quadrants.  Musculoskeletal: Normal range of motion.  Neurological: She is alert and oriented to person, place, and time.  Skin: Skin is warm and dry.  Psychiatric: She has a normal mood and affect.  Judgment normal.  Nursing note and vitals reviewed.  ED Treatments / Results  DIAGNOSTIC STUDIES:  Oxygen Saturation is 100% on RA, normal by my interpretation.    COORDINATION OF CARE:  3:27 AM Discussed treatment plan with pt at bedside and pt agreed to plan.  Labs (all labs ordered are listed, but only abnormal results are displayed) Labs Reviewed  COMPREHENSIVE METABOLIC PANEL - Abnormal; Notable for the following:       Result Value   Potassium 5.6 (*)    Glucose, Bld 101 (*)    All other components within normal limits  CBC - Abnormal; Notable for the following:    WBC 12.3 (*)    All other components within normal limits  LIPASE, BLOOD  URINALYSIS, ROUTINE W REFLEX MICROSCOPIC    EKG  EKG Interpretation None       Radiology Ct Abdomen Pelvis W Contrast  Result Date: 07/27/2016 CLINICAL DATA:  Lower quadrant abdominal pain and back pain for 1 week. History of Crohn disease. EXAM: CT ABDOMEN AND PELVIS WITH CONTRAST TECHNIQUE: Multidetector CT imaging of the abdomen and pelvis was performed using the standard protocol following bolus administration of intravenous contrast.  CONTRAST:  186m ISOVUE-300 IOPAMIDOL (ISOVUE-300) INJECTION 61% COMPARISON:  12/15/2014 FINDINGS: Lower chest: Lung bases are clear.  Small esophageal hiatal hernia. Hepatobiliary: Moderate diffuse fatty infiltration of the liver. No focal lesions. Gallbladder and bile ducts are unremarkable. Pancreas: Unremarkable. No pancreatic ductal dilatation or surrounding inflammatory changes. Spleen: Normal in size without focal abnormality. Adrenals/Urinary Tract: Adrenal glands are unremarkable. Kidneys are normal, without renal calculi, focal lesion, or hydronephrosis. Bladder is unremarkable. Stomach/Bowel: Stomach is unremarkable. Previous partial colon resection with ileal colonic anastomosis. Wall thickening at the terminal ileum with stranding in the surrounding fat consistent with history of Crohn disease. Inflammatory changes are less prominent than on the previous study. No abscess. Vascular/Lymphatic: Aortic atherosclerosis. No enlarged abdominal or pelvic lymph nodes. Reproductive: Status post hysterectomy. No adnexal masses. Other: Scarring in the anterior abdominal wall consistent with postoperative change. Mild infiltration in the subcutaneous fat of the anterior abdominal wall, likely representing postoperative scarring. This is decreased since previous study. Small ventral abdominal wall hernias containing fat. No bowel herniation. No free air or free fluid in the abdomen. Musculoskeletal: No acute or significant osseous findings. IMPRESSION: Previous partial colectomy with ileal colonic anastomosis. Wall thickening and stricturing at the terminal ileum at the level of the anastomosis consistent with changes of Crohn disease. Surrounding inflammatory infiltration in the adjacent fat. Inflammatory changes are decreased since previous study. No abscess identified. Surgical scarring in the anterior abdominal wall. No bowel obstruction. Fatty infiltration of the liver. Esophageal hiatal hernia. Electronically  Signed   By: WLucienne CapersM.D.   On: 07/27/2016 06:22    Procedures Procedures (including critical care time)  Medications Ordered in ED Medications  iopamidol (ISOVUE-300) 61 % injection (not administered)  sodium chloride 0.9 % bolus 1,000 mL (1,000 mLs Intravenous New Bag/Given 07/27/16 0504)  HYDROmorphone (DILAUDID) injection 1 mg (1 mg Intravenous Given 07/27/16 0504)  iopamidol (ISOVUE-300) 61 % injection (100 mLs  Contrast Given 07/27/16 0553)     Initial Impression / Assessment and Plan / ED Course  I have reviewed the triage vital signs and the nursing notes.  Pertinent labs & imaging results that were available during my care of the patient were reviewed by me and considered in my medical decision making (see chart for details).     Pt comes in  with cc of abd pain. She has hx of crohns. She is noted to have generalized tenderness, and her symptoms are worse. She has had severe complications from her crohns in the past and is not on any meds for crohns. CT ordered - and she has some strictures and inflammation surrounding the ileac strictures. I spoke with Dr. Benson Norway, and he recommended that we start pt on 40 mg prednisone x 7 days. Dr. Benson Norway requested that I prescribe her 120 x 10 mg prednisone tabs so that he can taper her after his clinic visit. He will see patient in 1 week.  Results from the ER workup discussed with the patient face to face and all questions answered to the best of my ability.  Pt has passed po challenge.  Strict ER return precautions have been discussed, and patient is agreeing with the plan and is comfortable with the workup done and the recommendations from the ER.   Final Clinical Impressions(s) / ED Diagnoses   Final diagnoses:  Crohn's colitis, without complications (HCC)    New Prescriptions New Prescriptions   OXYCODONE-ACETAMINOPHEN (PERCOCET/ROXICET) 5-325 MG TABLET    Take 1 tablet by mouth every 8 (eight) hours as needed for severe  pain.   PREDNISONE (DELTASONE) 10 MG TABLET    Take 4 tablets (40 mg total) by mouth daily.  I personally performed the services described in this documentation, which was scribed in my presence. The recorded information has been reviewed and is accurate.     Varney Biles, MD 07/27/16 [phone]

## 2016-07-27 NOTE — Discharge Instructions (Signed)
You have crohns colitis and some strictures. We spoke with Dr. Benson Norway-  he would like you to be started on prednisone and see him in 1 week.  WE HAVE PRESCRIBED MORE PREDNISONE THAN WHAT YOU NEED AS THE REQUEST OF DR. HUNG, SO THAT HE CAN TAPER YOU DOWN AFTER THE CLINIC VISIT.  Please return to the ER if your symptoms worsen; you have increased pain, fevers, chills, inability to keep any medications down, confusion. Otherwise see the outpatient doctor as requested.

## 2016-07-31 ENCOUNTER — Other Ambulatory Visit: Payer: Self-pay | Admitting: Internal Medicine

## 2016-07-31 DIAGNOSIS — Z1231 Encounter for screening mammogram for malignant neoplasm of breast: Secondary | ICD-10-CM

## 2016-08-06 ENCOUNTER — Telehealth: Payer: Self-pay | Admitting: Internal Medicine

## 2016-08-06 NOTE — Telephone Encounter (Signed)
Pt wanted to let Dr Ronnald Ramp know that she is taking 48m Prednizone daily prescribed by Dr HBenson Norwayher gastroenterologist. She has an appointment on 08/08/16 to check her potassium level and possibly an EKG requested by Dr JRonnald Rampthrough a pt email on 07/27/16.

## 2016-08-08 ENCOUNTER — Ambulatory Visit (INDEPENDENT_AMBULATORY_CARE_PROVIDER_SITE_OTHER): Payer: Medicare Other | Admitting: Internal Medicine

## 2016-08-08 ENCOUNTER — Other Ambulatory Visit (INDEPENDENT_AMBULATORY_CARE_PROVIDER_SITE_OTHER): Payer: Medicare Other

## 2016-08-08 ENCOUNTER — Encounter: Payer: Self-pay | Admitting: Internal Medicine

## 2016-08-08 VITALS — BP 128/70 | HR 69 | Temp 98.4°F | Resp 16 | Ht 62.0 in | Wt 219.2 lb

## 2016-08-08 DIAGNOSIS — I10 Essential (primary) hypertension: Secondary | ICD-10-CM | POA: Diagnosis not present

## 2016-08-08 DIAGNOSIS — E875 Hyperkalemia: Secondary | ICD-10-CM | POA: Diagnosis not present

## 2016-08-08 LAB — BASIC METABOLIC PANEL
BUN: 15 mg/dL (ref 6–23)
CHLORIDE: 100 meq/L (ref 96–112)
CO2: 33 meq/L — AB (ref 19–32)
CREATININE: 0.92 mg/dL (ref 0.40–1.20)
Calcium: 9.3 mg/dL (ref 8.4–10.5)
GFR: 79.87 mL/min (ref >60.00)
GLUCOSE: 103 mg/dL — AB (ref 70–99)
POTASSIUM: 3.8 meq/L (ref 3.5–5.1)
Sodium: 140 mEq/L (ref 135–145)

## 2016-08-08 NOTE — Progress Notes (Signed)
Pre visit review using our clinic review tool, if applicable. No additional management support is needed unless otherwise documented below in the visit note. 

## 2016-08-08 NOTE — Patient Instructions (Signed)

## 2016-08-08 NOTE — Telephone Encounter (Signed)
Pt had an appt today

## 2016-08-08 NOTE — Progress Notes (Signed)
Subjective:  Patient ID: April May, female    DOB: 01-22-1956  Age: 61 y.o. MRN: [id number]  CC: Hypertension   HPI April May presents for Follow-up after she was recently seen in the ED for complications from Crohns disease and was found to have mild hyperkalemia. She is seeing GI but continues to have episodes of mild diarrhea but has had no recent episodes of abdominal pain, nausea, or vomiting. She was advised to stop taking the potassium supplement and has complied with that request.  Outpatient Medications Prior to Visit  Medication Sig Dispense Refill  . Azilsartan Medoxomil (EDARBI) 80 MG TABS Take 1 tablet (80 mg total) by mouth daily. 90 tablet 3  . buPROPion (WELLBUTRIN XL) 150 MG 24 hr tablet TAKE 1 TABLET BY MOUTH ONCE A DAY IN EARLY AM  2  . clonazePAM (KLONOPIN) 0.5 MG tablet Take 0.5 mg by mouth 2 (two) times daily as needed for anxiety.   2  . fluticasone (FLONASE) 50 MCG/ACT nasal spray Place 2 sprays into both nostrils daily as needed for allergies or rhinitis. 16 g 11  . Indacaterol-Glycopyrrolate (UTIBRON NEOHALER) 27.5-15.6 MCG CAPS Place 1 Act into inhaler and inhale 2 (two) times daily. (Patient taking differently: Place 1 Act into inhaler and inhale 2 (two) times daily as needed (shortness of breath). ) 60 capsule 11  . lamoTRIgine (LAMICTAL) 200 MG tablet Take 200 mg by mouth at bedtime.    Marland Kitchen oxyCODONE-acetaminophen (PERCOCET/ROXICET) 5-325 MG tablet Take 1 tablet by mouth every 8 (eight) hours as needed for severe pain. 12 tablet 0  . predniSONE (DELTASONE) 10 MG tablet Take 4 tablets (40 mg total) by mouth daily. 120 tablet 0  . triamterene-hydrochlorothiazide (DYAZIDE) 37.5-25 MG capsule Take 1 each (1 capsule total) by mouth daily. 90 capsule 3  . ibuprofen (ADVIL,MOTRIN) 200 MG tablet Take 400 mg by mouth every 6 (six) hours as needed for headache or moderate pain.    . potassium chloride SA (K-DUR,KLOR-CON) 20 MEQ tablet Take 1 tablet (20 mEq  total) by mouth 3 (three) times daily. (Patient taking differently: Take 20 mEq by mouth daily. ) 90 tablet 5   No facility-administered medications prior to visit.     ROS Review of Systems  Constitutional: Negative.  Negative for appetite change, diaphoresis, fatigue and unexpected weight change.  HENT: Negative.  Negative for trouble swallowing.   Eyes: Negative.  Negative for visual disturbance.  Respiratory: Negative for cough, chest tightness, shortness of breath and wheezing.   Cardiovascular: Negative for chest pain, palpitations and leg swelling.  Gastrointestinal: Positive for diarrhea. Negative for abdominal pain, blood in stool, constipation, nausea and vomiting.  Endocrine: Negative.   Genitourinary: Negative.  Negative for difficulty urinating.  Musculoskeletal: Negative.  Negative for myalgias.  Skin: Negative.   Allergic/Immunologic: Negative.   Neurological: Negative.  Negative for dizziness, weakness, light-headedness and numbness.  Hematological: Negative.  Negative for adenopathy. Does not bruise/bleed easily.  Psychiatric/Behavioral: Negative.     Objective:  BP 128/70 (BP Location: Left Arm, Patient Position: Sitting, Cuff Size: Large)   Pulse 69   Temp 98.4 F (36.9 C) (Oral)   Ht 5\' 2"  (1.575 m)   Wt 219 lb 4 oz (99.5 kg)   LMP 05/14/1988   SpO2 94%   BMI 40.10 kg/m   BP Readings from Last 3 Encounters:  08/08/16 128/70  07/27/16 91/64  06/25/16 130/78    Wt Readings from Last 3 Encounters:  08/08/16 219 lb 4  oz (99.5 kg)  07/26/16 211 lb (95.7 kg)  06/25/16 208 lb 12 oz (94.7 kg)    Physical Exam  Constitutional: She is oriented to person, place, and time. No distress.  HENT:  Mouth/Throat: Oropharynx is clear and moist. No oropharyngeal exudate.  Eyes: Conjunctivae are normal. Right eye exhibits no discharge. Left eye exhibits no discharge. No scleral icterus.  Neck: Normal range of motion. Neck supple. No JVD present. No tracheal  deviation present. No thyromegaly present.  Cardiovascular: Normal rate, regular rhythm, normal heart sounds and intact distal pulses.  Exam reveals no gallop and no friction rub.   No murmur heard. EKG ---  Sinus  Rhythm  WITHIN NORMAL LIMITS   Pulmonary/Chest: Effort normal and breath sounds normal. No stridor. No respiratory distress. She has no wheezes. She has no rales. She exhibits no tenderness.  Abdominal: Soft. Bowel sounds are normal. She exhibits no distension and no mass. There is no tenderness. There is no rebound and no guarding.  Musculoskeletal: Normal range of motion. She exhibits no edema, tenderness or deformity.  Lymphadenopathy:    She has no cervical adenopathy.  Neurological: She is oriented to person, place, and time.  Skin: Skin is warm and dry. No rash noted. She is not diaphoretic. No erythema. No pallor.  Vitals reviewed.   Lab Results  Component Value Date   WBC 12.3 (H) 07/26/2016   HGB 14.0 07/26/2016   HCT 42.6 07/26/2016   PLT 321 07/26/2016   GLUCOSE 103 (H) 08/08/2016   CHOL 102 03/21/2016   TRIG 142.0 03/21/2016   HDL 36.50 (L) 03/21/2016   LDLCALC 37 03/21/2016   ALT 17 07/26/2016   AST 23 07/26/2016   NA 140 08/08/2016   K 3.8 08/08/2016   CL 100 08/08/2016   CREATININE 0.92 08/08/2016   BUN 15 08/08/2016   CO2 33 (H) 08/08/2016   TSH 0.75 06/25/2016   HGBA1C 5.7 03/21/2016   MICROALBUR 1.3 02/24/2015    Ct Abdomen Pelvis W Contrast  Result Date: 07/27/2016 CLINICAL DATA:  Lower quadrant abdominal pain and back pain for 1 week. History of Crohn disease. EXAM: CT ABDOMEN AND PELVIS WITH CONTRAST TECHNIQUE: Multidetector CT imaging of the abdomen and pelvis was performed using the standard protocol following bolus administration of intravenous contrast. CONTRAST:  110mL ISOVUE-300 IOPAMIDOL (ISOVUE-300) INJECTION 61% COMPARISON:  12/15/2014 FINDINGS: Lower chest: Lung bases are clear.  Small esophageal hiatal hernia. Hepatobiliary:  Moderate diffuse fatty infiltration of the liver. No focal lesions. Gallbladder and bile ducts are unremarkable. Pancreas: Unremarkable. No pancreatic ductal dilatation or surrounding inflammatory changes. Spleen: Normal in size without focal abnormality. Adrenals/Urinary Tract: Adrenal glands are unremarkable. Kidneys are normal, without renal calculi, focal lesion, or hydronephrosis. Bladder is unremarkable. Stomach/Bowel: Stomach is unremarkable. Previous partial colon resection with ileal colonic anastomosis. Wall thickening at the terminal ileum with stranding in the surrounding fat consistent with history of Crohn disease. Inflammatory changes are less prominent than on the previous study. No abscess. Vascular/Lymphatic: Aortic atherosclerosis. No enlarged abdominal or pelvic lymph nodes. Reproductive: Status post hysterectomy. No adnexal masses. Other: Scarring in the anterior abdominal wall consistent with postoperative change. Mild infiltration in the subcutaneous fat of the anterior abdominal wall, likely representing postoperative scarring. This is decreased since previous study. Small ventral abdominal wall hernias containing fat. No bowel herniation. No free air or free fluid in the abdomen. Musculoskeletal: No acute or significant osseous findings. IMPRESSION: Previous partial colectomy with ileal colonic anastomosis. Wall thickening and stricturing  at the terminal ileum at the level of the anastomosis consistent with changes of Crohn disease. Surrounding inflammatory infiltration in the adjacent fat. Inflammatory changes are decreased since previous study. No abscess identified. Surgical scarring in the anterior abdominal wall. No bowel obstruction. Fatty infiltration of the liver. Esophageal hiatal hernia. Electronically Signed   By: Lucienne Capers M.D.   On: 07/27/2016 06:22    Assessment & Plan:   Del was seen today for hypertension.  Diagnoses and all orders for this  visit:  HYPERTENSION, BENIGN ESSENTIAL- her blood pressure is adequately well-controlled, electrolytes and renal function are normal. -     Basic metabolic panel; Future -     EKG 12-Lead  Hyperkalemia- there are no changes on her EKG suspicious for damages from hyperkalemia, her potassium level is in the normal range on no potassium supplement and she does take a potassium sparing diuretic so I've asked her to hold on any potassium replacement therapy at this time. -     Basic metabolic panel; Future -     EKG 12-Lead   I have discontinued Ms. Rahal's ibuprofen and potassium chloride SA. I am also having her maintain her lamoTRIgine, fluticasone, clonazePAM, Azilsartan Medoxomil, triamterene-hydrochlorothiazide, buPROPion, Indacaterol-Glycopyrrolate, oxyCODONE-acetaminophen, and predniSONE.  No orders of the defined types were placed in this encounter.    Follow-up: Return in about 4 months (around 12/08/2016).  Scarlette Calico, MD

## 2016-08-21 ENCOUNTER — Ambulatory Visit
Admission: RE | Admit: 2016-08-21 | Discharge: 2016-08-21 | Disposition: A | Discharge status: Home / Self Care | Payer: Medicare Other | Source: Ambulatory Visit | Attending: Internal Medicine | Admitting: Internal Medicine

## 2016-08-21 DIAGNOSIS — Z1231 Encounter for screening mammogram for malignant neoplasm of breast: Secondary | ICD-10-CM

## 2016-08-22 DIAGNOSIS — K50814 Crohn's disease of both small and large intestine with abscess: Secondary | ICD-10-CM | POA: Diagnosis not present

## 2016-08-22 LAB — HM MAMMOGRAPHY

## 2016-09-12 ENCOUNTER — Telehealth: Payer: Self-pay | Admitting: Internal Medicine

## 2016-09-12 NOTE — Telephone Encounter (Signed)
Pt called and states her April May Doc is not in system, pt has Crohn's disease, Wants to give her April May shot , would like to make sure this shot will not interfere with her current health or her medications. She has to get the shot 2x per week for the rest of her life. Pt is uneasy about this. Would like relief. Wants to know if there are any other alternatives.

## 2016-09-13 DIAGNOSIS — K50814 Crohn's disease of both small and large intestine with abscess: Secondary | ICD-10-CM | POA: Diagnosis not present

## 2016-09-18 ENCOUNTER — Encounter: Payer: Self-pay | Admitting: Internal Medicine

## 2016-09-18 ENCOUNTER — Other Ambulatory Visit (INDEPENDENT_AMBULATORY_CARE_PROVIDER_SITE_OTHER): Payer: Medicare Other

## 2016-09-18 ENCOUNTER — Ambulatory Visit (INDEPENDENT_AMBULATORY_CARE_PROVIDER_SITE_OTHER): Payer: Medicare Other | Admitting: Internal Medicine

## 2016-09-18 VITALS — BP 100/58 | HR 89 | Temp 98.3°F | Resp 16 | Ht 62.0 in | Wt 220.2 lb

## 2016-09-18 DIAGNOSIS — I10 Essential (primary) hypertension: Secondary | ICD-10-CM | POA: Diagnosis not present

## 2016-09-18 DIAGNOSIS — M791 Myalgia, unspecified site: Secondary | ICD-10-CM

## 2016-09-18 DIAGNOSIS — R531 Weakness: Secondary | ICD-10-CM

## 2016-09-18 DIAGNOSIS — E876 Hypokalemia: Secondary | ICD-10-CM

## 2016-09-18 DIAGNOSIS — J302 Other seasonal allergic rhinitis: Secondary | ICD-10-CM

## 2016-09-18 LAB — COMPREHENSIVE METABOLIC PANEL
ALBUMIN: 4.2 g/dL (ref 3.5–5.2)
ALK PHOS: 67 U/L (ref 39–117)
ALT: 15 U/L (ref 0–35)
AST: 14 U/L (ref 0–37)
BUN: 11 mg/dL (ref 6–23)
CHLORIDE: 106 meq/L (ref 96–112)
CO2: 31 mEq/L (ref 19–32)
Calcium: 9.6 mg/dL (ref 8.4–10.5)
Creatinine, Ser: 0.87 mg/dL (ref 0.40–1.20)
GFR: 85.16 mL/min (ref >60.00)
Glucose, Bld: 113 mg/dL — ABNORMAL HIGH (ref 70–99)
POTASSIUM: 3.2 meq/L — AB (ref 3.5–5.1)
SODIUM: 142 meq/L (ref 135–145)
TOTAL PROTEIN: 7.2 g/dL (ref 6.0–8.3)
Total Bilirubin: 0.4 mg/dL (ref 0.2–1.2)

## 2016-09-18 LAB — CBC WITH DIFFERENTIAL/PLATELET
BASOS PCT: 0.3 % (ref 0.0–3.0)
Basophils Absolute: 0 10*3/uL (ref 0.0–0.1)
EOS PCT: 1.3 % (ref 0.0–5.0)
Eosinophils Absolute: 0.1 10*3/uL (ref 0.0–0.7)
HEMATOCRIT: 40.4 % (ref 36.0–46.0)
HEMOGLOBIN: 13.8 g/dL (ref 12.0–15.0)
Lymphocytes Relative: 32.8 % (ref 12.0–46.0)
Lymphs Abs: 3.6 10*3/uL (ref 0.7–4.0)
MCHC: 34 g/dL (ref 30.0–36.0)
MCV: 94.4 fl (ref 78.0–100.0)
Monocytes Absolute: 0.9 10*3/uL (ref 0.1–1.0)
Monocytes Relative: 8.5 % (ref 3.0–12.0)
Neutro Abs: 6.3 10*3/uL (ref 1.4–7.7)
Neutrophils Relative %: 57.1 % (ref 43.0–77.0)
Platelets: 330 10*3/uL (ref 150.0–400.0)
RBC: 4.28 Mil/uL (ref 3.87–5.11)
RDW: 13.6 % (ref 11.5–15.5)
WBC: 10.9 10*3/uL — AB (ref 4.0–10.5)

## 2016-09-18 LAB — MAGNESIUM: MAGNESIUM: 1.7 mg/dL (ref 1.5–2.5)

## 2016-09-18 LAB — SEDIMENTATION RATE: Sed Rate: 12 mm/hr (ref 0–30)

## 2016-09-18 LAB — CK: Total CK: 127 U/L (ref 7–177)

## 2016-09-18 MED ORDER — POTASSIUM CHLORIDE CRYS ER 20 MEQ PO TBCR: 20.0000 meq | EXTENDED_RELEASE_TABLET | Freq: Three times a day (TID) | ORAL | 3 refills | Status: DC

## 2016-09-18 MED ORDER — HYDROCHLOROTHIAZIDE 12.5 MG PO CAPS: 12.5000 mg | ORAL_CAPSULE | Freq: Every day | ORAL | 0 refills | Status: DC

## 2016-09-18 MED ORDER — FLUTICASONE PROPIONATE 50 MCG/ACT NA SUSP: 2.0000 | Freq: Every day | NASAL | 3 refills | Status: DC | PRN

## 2016-09-18 NOTE — Progress Notes (Signed)
Subjective:  Patient ID: April May, female    DOB: [birth year]  Age: 61 y.o. MRN: [id number]  CC: Hypertension   HPI April May presents for f/up - She complains of a several week history of muscle aches, fatigue, and muscle weakness. She says her muscle aches get worse after she does some exercise. She denies lightheadedness or dizziness.  Outpatient Medications Prior to Visit  Medication Sig Dispense Refill  . buPROPion (WELLBUTRIN XL) 150 MG 24 hr tablet TAKE 1 TABLET BY MOUTH ONCE A DAY IN EARLY AM  2  . clonazePAM (KLONOPIN) 0.5 MG tablet Take 0.5 mg by mouth 2 (two) times daily as needed for anxiety.   2  . Indacaterol-Glycopyrrolate (UTIBRON NEOHALER) 27.5-15.6 MCG CAPS Place 1 Act into inhaler and inhale 2 (two) times daily. (Patient taking differently: Place 1 Act into inhaler and inhale 2 (two) times daily as needed (shortness of breath). ) 60 capsule 11  . lamoTRIgine (LAMICTAL) 200 MG tablet Take 200 mg by mouth at bedtime.    Marland Kitchen oxyCODONE-acetaminophen (PERCOCET/ROXICET) 5-325 MG tablet Take 1 tablet by mouth every 8 (eight) hours as needed for severe pain. 12 tablet 0  . Azilsartan Medoxomil (EDARBI) 80 MG TABS Take 1 tablet (80 mg total) by mouth daily. 90 tablet 3  . fluticasone (FLONASE) 50 MCG/ACT nasal spray Place 2 sprays into both nostrils daily as needed for allergies or rhinitis. 16 g 11  . predniSONE (DELTASONE) 10 MG tablet Take 4 tablets (40 mg total) by mouth daily. 120 tablet 0  . triamterene-hydrochlorothiazide (DYAZIDE) 37.5-25 MG capsule Take 1 each (1 capsule total) by mouth daily. 90 capsule 3   No facility-administered medications prior to visit.     ROS Review of Systems  Constitutional: Positive for fatigue. Negative for appetite change, chills, diaphoresis and unexpected weight change.  HENT: Positive for congestion, postnasal drip and rhinorrhea. Negative for sinus pain, sinus pressure, sneezing and sore throat.   Eyes: Negative for  visual disturbance.  Respiratory: Negative for cough, chest tightness, shortness of breath and wheezing.   Cardiovascular: Negative for chest pain, palpitations and leg swelling.  Gastrointestinal: Positive for diarrhea (intermittent loose stools). Negative for abdominal pain, constipation, nausea and vomiting.  Endocrine: Negative.  Negative for cold intolerance, heat intolerance, polydipsia, polyphagia and polyuria.  Genitourinary: Negative.  Negative for decreased urine volume, difficulty urinating, dysuria, flank pain, frequency and urgency.  Musculoskeletal: Positive for myalgias. Negative for arthralgias, back pain, gait problem and neck stiffness.  Skin: Negative.  Negative for color change and rash.  Allergic/Immunologic: Negative.   Neurological: Positive for weakness. Negative for dizziness, tremors, light-headedness, numbness and headaches.  Hematological: Negative for adenopathy. Does not bruise/bleed easily.  Psychiatric/Behavioral: Negative.     Objective:  BP (!) 100/58 (BP Location: Left Arm, Patient Position: Sitting, Cuff Size: Large)   Pulse 89   Temp 98.3 F (36.8 C) (Oral)   Resp 16   Ht 5\' 2"  (1.575 m)   Wt 220 lb 4 oz (99.9 kg)   LMP 05/14/1988   SpO2 100%   BMI 40.28 kg/m   BP Readings from Last 3 Encounters:  09/18/16 (!) 100/58  08/08/16 128/70  07/27/16 91/64    Wt Readings from Last 3 Encounters:  09/18/16 220 lb 4 oz (99.9 kg)  08/08/16 219 lb 4 oz (99.5 kg)  07/26/16 211 lb (95.7 kg)    Physical Exam  Constitutional: She is oriented to person, place, and time. No distress.  HENT:  Mouth/Throat: Oropharynx is clear and moist. No oropharyngeal exudate.  Eyes: Conjunctivae are normal. Right eye exhibits no discharge. Left eye exhibits no discharge. No scleral icterus.  Neck: Normal range of motion. Neck supple. No JVD present. No tracheal deviation present. No thyromegaly present.  Cardiovascular: Normal rate, regular rhythm, normal heart sounds  and intact distal pulses.  Exam reveals no gallop and no friction rub.   No murmur heard. Pulmonary/Chest: Effort normal and breath sounds normal. No stridor. No respiratory distress. She has no wheezes. She has no rales. She exhibits no tenderness.  Abdominal: Soft. Bowel sounds are normal. She exhibits no distension and no mass. There is no tenderness. There is no rebound and no guarding.  Musculoskeletal: Normal range of motion. She exhibits no edema, tenderness or deformity.  Lymphadenopathy:    She has no cervical adenopathy.  Neurological: She is alert and oriented to person, place, and time. She displays no atrophy and no tremor. No cranial nerve deficit or sensory deficit. She exhibits normal muscle tone. She displays no seizure activity. Coordination and gait normal.  There is no PMW  Skin: Skin is warm and dry. No rash noted. She is not diaphoretic. No erythema. No pallor.  Vitals reviewed.   Lab Results  Component Value Date   WBC 10.9 (H) 09/18/2016   HGB 13.8 09/18/2016   HCT 40.4 09/18/2016   PLT 330.0 09/18/2016   GLUCOSE 113 (H) 09/18/2016   CHOL 102 03/21/2016   TRIG 142.0 03/21/2016   HDL 36.50 (L) 03/21/2016   LDLCALC 37 03/21/2016   ALT 15 09/18/2016   AST 14 09/18/2016   NA 142 09/18/2016   K 3.2 (L) 09/18/2016   CL 106 09/18/2016   CREATININE 0.87 09/18/2016   BUN 11 09/18/2016   CO2 31 09/18/2016   TSH 0.75 06/25/2016   HGBA1C 5.7 03/21/2016   MICROALBUR 1.3 02/24/2015    Mm Screening Breast Tomo Bilateral  Result Date: 08/22/2016 CLINICAL DATA:  Screening. EXAM: 2D DIGITAL SCREENING BILATERAL MAMMOGRAM WITH CAD AND ADJUNCT TOMO COMPARISON:  Previous exam(s). ACR Breast Density Category b: There are scattered areas of fibroglandular density. FINDINGS: There are no findings suspicious for malignancy. Images were processed with CAD. IMPRESSION: No mammographic evidence of malignancy. A result letter of this screening mammogram will be mailed directly to the  patient. RECOMMENDATION: Screening mammogram in one year. (Code:SM-B-01Y) BI-RADS CATEGORY  1: Negative. Electronically Signed   By: Ammie Ferrier M.D.   On: 08/22/2016 08:01    Assessment & Plan:   April May was seen today for hypertension.  Diagnoses and all orders for this visit:  HYPERTENSION, BENIGN ESSENTIAL- her blood pressure is over controlled and she is symptomatic so I've asked her to taper her diuretic dose down to a lower dose of hydrochlorothiazide and to stop taking the triamterene, will also stop the ARB. She has mild hypokalemia so will replete that with an oral supplement. -     hydrochlorothiazide (MICROZIDE) 12.5 MG capsule; Take 1 capsule (12.5 mg total) by mouth daily. -     Comprehensive metabolic panel; Future -     CBC with Differential/Platelet; Future -     Magnesium; Future -     potassium chloride SA (K-DUR,KLOR-CON) 20 MEQ tablet; Take 1 tablet (20 mEq total) by mouth 3 (three) times daily.  Weakness- this is most likely related to the hypokalemia so will treat that, will also screen her for myasthenia gravis. -     Acetylcholine receptor, binding; Future -  Striated muscle antibody; Future -     Magnesium; Future  Myalgia- her CK and sedimentation rate are normal so there is no evidence of myopathy or myositis, will treat the hypokalemia and screen for myasthenia gravis. -     Acetylcholine receptor, binding; Future -     Striated muscle antibody; Future -     Comprehensive metabolic panel; Future -     Magnesium; Future -     CK; Future -     Sedimentation rate; Future  Other seasonal allergic rhinitis -     fluticasone (FLONASE) 50 MCG/ACT nasal spray; Place 2 sprays into both nostrils daily as needed for allergies or rhinitis.  Hypokalemia -     potassium chloride SA (K-DUR,KLOR-CON) 20 MEQ tablet; Take 1 tablet (20 mEq total) by mouth 3 (three) times daily.   I have discontinued April May Azilsartan Medoxomil,  triamterene-hydrochlorothiazide, and predniSONE. I am also having her start on hydrochlorothiazide and potassium chloride SA. Additionally, I am having her maintain her lamoTRIgine, clonazePAM, buPROPion, Indacaterol-Glycopyrrolate, oxyCODONE-acetaminophen, and fluticasone.  Meds ordered this encounter  Medications  . hydrochlorothiazide (MICROZIDE) 12.5 MG capsule    Sig: Take 1 capsule (12.5 mg total) by mouth daily.    Dispense:  90 capsule    Refill:  0  . fluticasone (FLONASE) 50 MCG/ACT nasal spray    Sig: Place 2 sprays into both nostrils daily as needed for allergies or rhinitis.    Dispense:  42 g    Refill:  3  . potassium chloride SA (K-DUR,KLOR-CON) 20 MEQ tablet    Sig: Take 1 tablet (20 mEq total) by mouth 3 (three) times daily.    Dispense:  90 tablet    Refill:  3     Follow-up: Return in about 3 weeks (around 10/09/2016).  Scarlette Calico, MD

## 2016-09-18 NOTE — Patient Instructions (Signed)

## 2016-09-18 NOTE — Progress Notes (Signed)
Pre visit review using our clinic review tool, if applicable. No additional management support is needed unless otherwise documented below in the visit note. 

## 2016-09-19 ENCOUNTER — Encounter: Payer: Self-pay | Admitting: Internal Medicine

## 2016-09-19 NOTE — Telephone Encounter (Signed)
Pt was seen yesterday.

## 2016-09-20 ENCOUNTER — Telehealth: Payer: Self-pay | Admitting: Internal Medicine

## 2016-09-20 DIAGNOSIS — K50919 Crohn's disease, unspecified, with unspecified complications: Secondary | ICD-10-CM

## 2016-09-20 DIAGNOSIS — K50813 Crohn's disease of both small and large intestine with fistula: Secondary | ICD-10-CM

## 2016-09-20 NOTE — Telephone Encounter (Signed)
Patient state has been under the care of a GI MD outside of [place].  States that this GI MD wants to place her on a medication she does not feel comfortable taking.  Would like to know if Dr. Ronnald Ramp could refer her to our GI department for a second opinion.  Patient understands that Ronnald Ramp is out of the office until May 21st.

## 2016-09-20 NOTE — Telephone Encounter (Signed)
GI referral entered

## 2016-09-21 ENCOUNTER — Other Ambulatory Visit: Payer: Self-pay | Admitting: *Deleted

## 2016-09-21 DIAGNOSIS — J302 Other seasonal allergic rhinitis: Secondary | ICD-10-CM

## 2016-09-21 MED ORDER — FLUTICASONE PROPIONATE 50 MCG/ACT NA SUSP: 2.0000 | Freq: Every day | NASAL | 3 refills | Status: DC | PRN

## 2016-09-21 NOTE — Telephone Encounter (Signed)
Notified patient.

## 2016-09-22 LAB — STRIATED MUSCLE ANTIBODY: Striated Muscle Ab: <1:40 {titer}

## 2016-09-22 LAB — ACETYLCHOLINE RECEPTOR, BINDING

## 2016-09-24 ENCOUNTER — Encounter: Payer: Self-pay | Admitting: Internal Medicine

## 2016-09-25 ENCOUNTER — Telehealth: Payer: Self-pay | Admitting: Internal Medicine

## 2016-09-25 NOTE — Telephone Encounter (Signed)
Her BP has been low and increasing medication could cause her BP to be low - she should be seen.    She needs to make sure is she compliant with a low sodium diet.

## 2016-09-25 NOTE — Telephone Encounter (Signed)
lvmom for pt to make an appt.   Also, sent mychart message stating same.

## 2016-09-25 NOTE — Telephone Encounter (Signed)
Call this week and again when jones gets back?

## 2016-09-25 NOTE — Telephone Encounter (Signed)
Previous my chart message - pt states that her BP was too low and Dr. Ronnald Ramp discontinued the BP med with the diuretic. She is still taking taking the hydrochlorothiazide as prescribed but is still experiencing swelling.   Can you advise in PCP absence please?

## 2016-09-25 NOTE — Telephone Encounter (Signed)
Pt called regarding the Mychart messages between [place] and [facility]. She will not see someone else and she will not come in. She states she was in last week and there is no need to come in. She would like a phone cal stating what to do and would like a phone call when Dr. Ronnald Ramp get back in the office.

## 2016-09-26 ENCOUNTER — Other Ambulatory Visit: Payer: Self-pay

## 2016-09-26 DIAGNOSIS — E876 Hypokalemia: Secondary | ICD-10-CM

## 2016-09-26 DIAGNOSIS — I10 Essential (primary) hypertension: Secondary | ICD-10-CM

## 2016-09-26 MED ORDER — POTASSIUM CHLORIDE CRYS ER 20 MEQ PO TBCR: 20.0000 meq | EXTENDED_RELEASE_TABLET | Freq: Three times a day (TID) | ORAL | 3 refills | Status: DC

## 2016-09-26 NOTE — Telephone Encounter (Signed)
Pt wants to have Dr. Ronnald Ramp advise as to what she should do about her feet swelling.

## 2016-09-26 NOTE — Telephone Encounter (Signed)
Yes ma'am! 

## 2016-09-29 NOTE — Telephone Encounter (Signed)
What is wrong with her feet?

## 2016-10-01 NOTE — Telephone Encounter (Signed)
Her feet are swelling - Azilsartan Medoxomil, triamterene-hydrochlorothiazide was dc'ed at [place] on 09/18/2016. Pt stated that she is taking the HCTZ as rx'ed.   Pt refused an appt last week.

## 2016-10-02 NOTE — Telephone Encounter (Signed)
I recommend that she go to a medical supply store and get compression hose for her feet

## 2016-10-05 NOTE — Telephone Encounter (Signed)
Patient called to follow up on this. She was informed about Dr.Jones notes. She will try it and let us know.

## 2016-10-10 ENCOUNTER — Encounter: Payer: Self-pay | Admitting: Internal Medicine

## 2016-10-11 ENCOUNTER — Other Ambulatory Visit: Payer: Self-pay | Admitting: Internal Medicine

## 2016-10-11 ENCOUNTER — Telehealth: Payer: Self-pay | Admitting: Internal Medicine

## 2016-10-11 DIAGNOSIS — I10 Essential (primary) hypertension: Secondary | ICD-10-CM

## 2016-10-11 MED ORDER — NEBIVOLOL HCL 10 MG PO TABS: 10.0000 mg | ORAL_TABLET | Freq: Every day | ORAL | 3 refills | Status: DC

## 2016-10-11 MED ORDER — NEBIVOLOL HCL 5 MG PO TABS: 5.0000 mg | ORAL_TABLET | Freq: Every day | ORAL | 1 refills | Status: DC

## 2016-10-11 MED ORDER — NEBIVOLOL HCL 5 MG PO TABS: 5.0000 mg | ORAL_TABLET | Freq: Every day | ORAL | 2 refills | Status: DC

## 2016-10-11 NOTE — Telephone Encounter (Signed)
Contacted pt and she stated that pharmacy said we did not send in generic. Asked if pt needed [place] to send in for 90 day. Resent to mail order and local pharmacy.

## 2016-10-11 NOTE — Telephone Encounter (Signed)
What sent in?

## 2016-10-11 NOTE — Telephone Encounter (Signed)
Explained the medications to pt. Pt has an appt on 10/16/2016. Informed pt that we would recheck BP and medication adherence at that time. Informed pt that we could discuss weight management only if BP was normal and she was symptom free.

## 2016-10-11 NOTE — Telephone Encounter (Signed)
Pt would like this sent in as generic. Please call when done.

## 2016-10-11 NOTE — Telephone Encounter (Signed)
Pt would like another cal back her BP was 149/182. Would like to know if Bystolic is for Bp or if Joens wants her to start another med.  Would like to know if her Bystolic will do anything to her Potassium.

## 2016-10-11 NOTE — Telephone Encounter (Signed)
Im sorry, Her Bystrolic

## 2016-10-16 ENCOUNTER — Ambulatory Visit: Payer: Medicare Other | Admitting: Internal Medicine

## 2016-10-18 ENCOUNTER — Encounter: Payer: Self-pay | Admitting: Internal Medicine

## 2016-10-18 ENCOUNTER — Ambulatory Visit (INDEPENDENT_AMBULATORY_CARE_PROVIDER_SITE_OTHER): Payer: Medicare Other | Admitting: Internal Medicine

## 2016-10-18 VITALS — BP 110/78 | HR 62 | Ht 63.0 in | Wt 217.0 lb

## 2016-10-18 DIAGNOSIS — E118 Type 2 diabetes mellitus with unspecified complications: Secondary | ICD-10-CM

## 2016-10-18 DIAGNOSIS — I10 Essential (primary) hypertension: Secondary | ICD-10-CM | POA: Diagnosis not present

## 2016-10-18 DIAGNOSIS — R600 Localized edema: Secondary | ICD-10-CM | POA: Insufficient documentation

## 2016-10-18 DIAGNOSIS — E876 Hypokalemia: Secondary | ICD-10-CM | POA: Diagnosis not present

## 2016-10-18 NOTE — Assessment & Plan Note (Signed)
Mild low, s/p po replacement, for f/u lab today per pt request

## 2016-10-18 NOTE — Assessment & Plan Note (Signed)
Low normal with resolution of dizzy and weak recentlly with overcontrol at last visit; would not add further meds; pt states she is not actually taking the Bystolic, so I would think this does not need to start at this time BP Readings from Last 3 Encounters:  10/18/16 110/78  09/18/16 (!) 100/58  08/08/16 128/70

## 2016-10-18 NOTE — Assessment & Plan Note (Signed)
Pt specifically requests f/u a1c today though I am not her PCP; ok for lab with f/u PCP next visit; cont diet, meds, wt loss efforts

## 2016-10-18 NOTE — Assessment & Plan Note (Addendum)
Mild persistent, differential includes CHF, pulm HTN vs other; may be worse related to stopping the edarbi recently; at this point given her good BP, tendency to low K, and resolution of low BP symptoms, I would not increase the HCT; urged pt for leg elevation, low salt, wt loss, exercise; will defer any further eval to PCP such as [facility]

## 2016-10-18 NOTE — Patient Instructions (Addendum)
OK to NOT start the Bystolic  Please continue all other medications as before, and refills have been done if requested.  Please have the pharmacy call with any other refills you may need.  Please continue your efforts at being more active, low cholesterol diet, and weight control.  Please keep your appointments with your specialists as you may have planned  Please go to the LAB in the Basement (turn left off the elevator) for the tests to be done today  You will be contacted by phone if any changes need to be made immediately.  Otherwise, you will receive a letter about your results with an explanation, but please check with MyChart first.  Please remember to sign up for MyChart if you have not done so, as this will be important to you in the future with finding out test results, communicating by private email, and scheduling acute appointments online when needed.

## 2016-10-18 NOTE — Progress Notes (Signed)
Subjective:    Patient ID: April May, female    DOB: 11-24-1955, 61 y.o.   MRN: [id number]  HPI Here to f/u BP after recent low BP with dizzy and weakness without falls, resolved with stopping edarbi.  Last seen per PCP_ may 8, there is documented no edema present; pt has new bilat mild pedal edema to ankles, but wt is down 3 lbs (??) Wt Readings from Last 3 Encounters:  10/18/16 217 lb (98.4 kg)  09/18/16 220 lb 4 oz (99.9 kg)  08/08/16 219 lb 4 oz (99.5 kg)  Has had some need fr K check per pt, and wondering about her thyroid due to overall recent wt gain, started at 200, and was 208 in feb.2018.   Pt denies polydipsia, polyuria, or low sugar symptoms such as weakness or confusion improved with po intake.  Pt states overall good compliance with meds, trying to follow lower cholesterol, diabetic diet, but little exercise however.    Past Medical History:  Diagnosis Date  . Allergy   . Crohn disease ([place])   . Depression   . Diabetes mellitus type II    borderline, md checks hemaglobin A 1 c at md office   . Hypertension   . Sleep apnea    does not use cpap since weight loss 1 and 1/2 years ago   Past Surgical History:  Procedure Laterality Date  . ABDOMINAL HYSTERECTOMY     1 ovary removed  . APPENDECTOMY    . APPLICATION OF WOUND VAC  11/24/2014   Procedure: APPLICATION OF WOUND VAC;  Surgeon: Donnie Mesa, MD;  Location: [facility];  Service: General;;  . BOWEL RESECTION N/A 11/24/2014   Procedure: SMALL BOWEL RESECTION;  Surgeon: Donnie Mesa, MD;  Location: [facility];  Service: General;  Laterality: N/A;  . BREAST EXCISIONAL BIOPSY Right 1972  . BREAST SURGERY Right 40 yrs ago   benign tumor removed  . LAPAROTOMY N/A 11/24/2014   Procedure: EXPLORATORY LAPAROTOMY EXTENSIVE LYSIS OF ADHESIONS SAMLL BOWEL RESECTION RIGHT HEMI COLECTOMY WOUND VAC APPLICATION. ;  Surgeon: Donnie Mesa, MD;  Location: [facility];  Service: General;  Laterality: N/A;  . LYSIS OF ADHESION  11/24/2014     Procedure: LYSIS OF ADHESION (3 HRS);  Surgeon: Donnie Mesa, MD;  Location: [facility];  Service: General;;  . PARTIAL COLECTOMY  11/24/2014   Procedure: RIGHT HEMI COLECTOMY;  Surgeon: Donnie Mesa, MD;  Location: [facility];  Service: General;;  . SMALL INTESTINE SURGERY  age 49    reports that she has been smoking Cigarettes.  She has a 35.00 pack-year smoking history. She has never used smokeless tobacco. She reports that she does not drink alcohol or use drugs. family history includes Alcohol abuse in her paternal uncle; Arthritis in her father; Diabetes in her paternal grandmother; Hypertension in her mother; Stroke in her paternal grandmother. Allergies  Allergen Reactions  . Azathioprine Other (See Comments)    Side effects to pancrease     . Crestor [Rosuvastatin Calcium] Other (See Comments)    Muscle and joint aches  . Lipitor [Atorvastatin Calcium] Other (See Comments)    Muscle cramps  . Lisinopril Cough   Current Outpatient Prescriptions on File Prior to Visit  Medication Sig Dispense Refill  . buPROPion (WELLBUTRIN XL) 150 MG 24 hr tablet TAKE 1 TABLET BY MOUTH ONCE A DAY IN EARLY AM  2  . clonazePAM (KLONOPIN) 0.5 MG tablet Take 0.5 mg by mouth 2 (two) times daily as  needed for anxiety.   2  . fluticasone (FLONASE) 50 MCG/ACT nasal spray Place 2 sprays into both nostrils daily as needed for allergies or rhinitis. 42 g 3  . hydrochlorothiazide (MICROZIDE) 12.5 MG capsule Take 1 capsule (12.5 mg total) by mouth daily. 90 capsule 0  . Indacaterol-Glycopyrrolate (UTIBRON NEOHALER) 27.5-15.6 MCG CAPS Place 1 Act into inhaler and inhale 2 (two) times daily. (Patient taking differently: Place 1 Act into inhaler and inhale 2 (two) times daily as needed (shortness of breath). ) 60 capsule 11  . lamoTRIgine (LAMICTAL) 200 MG tablet Take 200 mg by mouth at bedtime.    Marland Kitchen oxyCODONE-acetaminophen (PERCOCET/ROXICET) 5-325 MG tablet Take 1 tablet by mouth every 8 (eight) hours as needed for  severe pain. 12 tablet 0  . potassium chloride SA (K-DUR,KLOR-CON) 20 MEQ tablet Take 1 tablet (20 mEq total) by mouth 3 (three) times daily. 90 tablet 3   No current facility-administered medications on file prior to visit.    Review of Systems  Constitutional: Negative for other unusual diaphoresis or sweats HENT: Negative for ear discharge or swelling Eyes: Negative for other worsening visual disturbances Respiratory: Negative for stridor or other swelling  Gastrointestinal: Negative for worsening distension or other blood Genitourinary: Negative for retention or other urinary change Musculoskeletal: Negative for other MSK pain or swelling Skin: Negative for color change or other new lesions Neurological: Negative for worsening tremors and other numbness  Psychiatric/Behavioral: Negative for worsening agitation or other fatigue All other system neg per pt    Objective:   Physical Exam BP 110/78   Pulse 62   Ht 5' 3"  (1.6 m)   Wt 217 lb (98.4 kg)   LMP 05/14/1988   SpO2 100%   BMI 38.44 kg/m  VS noted, not ill appearing, morbid obese for ht Constitutional: Pt appears in NAD HENT: Head: NCAT.  Right Ear: External ear normal.  Left Ear: External ear normal.  Eyes: . Pupils are equal, round, and reactive to light. Conjunctivae and EOM are normal Nose: without d/c or deformity Neck: Neck supple. Gross normal ROM Cardiovascular: Normal rate and regular rhythm.   Pulmonary/Chest: Effort normal and breath sounds decreased without rales or wheezing.  Abd:  Soft, NT, ND, + BS, no organomegaly Neurological: Pt is alert. At baseline orientation, motor grossly intact Skin: Skin is warm. No rashes, other new lesions, + bilat mild pedal LE edema Psychiatric: Pt behavior is normal without agitation  No other exam findings  Echo nov 2017 - ordered but not accomplished  Lab Results  Component Value Date   WBC 10.9 (H) 09/18/2016   HGB 13.8 09/18/2016   HCT 40.4 09/18/2016   PLT  330.0 09/18/2016   GLUCOSE 113 (H) 09/18/2016   CHOL 102 03/21/2016   TRIG 142.0 03/21/2016   HDL 36.50 (L) 03/21/2016   LDLCALC 37 03/21/2016   ALT 15 09/18/2016   AST 14 09/18/2016   NA 142 09/18/2016   K 3.2 (L) 09/18/2016   CL 106 09/18/2016   CREATININE 0.87 09/18/2016   BUN 11 09/18/2016   CO2 31 09/18/2016   TSH 0.75 06/25/2016   HGBA1C 5.7 03/21/2016   MICROALBUR 1.3 02/24/2015       Assessment & Plan:

## 2016-10-26 ENCOUNTER — Other Ambulatory Visit (INDEPENDENT_AMBULATORY_CARE_PROVIDER_SITE_OTHER): Payer: Medicare Other

## 2016-10-26 DIAGNOSIS — E118 Type 2 diabetes mellitus with unspecified complications: Secondary | ICD-10-CM | POA: Diagnosis not present

## 2016-10-26 LAB — BASIC METABOLIC PANEL
BUN: 9 mg/dL (ref 6–23)
CALCIUM: 9.8 mg/dL (ref 8.4–10.5)
CO2: 26 meq/L (ref 19–32)
CREATININE: 0.73 mg/dL (ref 0.40–1.20)
Chloride: 106 mEq/L (ref 96–112)
GFR: 104.23 mL/min (ref >60.00)
GLUCOSE: 97 mg/dL (ref 70–99)
Potassium: 3.8 mEq/L (ref 3.5–5.1)
Sodium: 140 mEq/L (ref 135–145)

## 2016-10-26 LAB — HEMOGLOBIN A1C: Hgb A1c MFr Bld: 5.6 % (ref 4.6–6.5)

## 2016-11-17 IMAGING — CT CT ABD-PELV W/ CM
2 of 5 series · 16 of 46 positions shown, 18 images · IV contrast (omnipaque)
Comparison: 04/13/2012

CLINICAL DATA: Midline to RIGHT-side abdominal pain at upper and
lower abdomen and nausea for 2 weeks, history diabetes and Crohn's
disease, prior hysterectomy, appendectomy, small bowel resection

EXAM:
CT ABDOMEN AND PELVIS WITH CONTRAST
TECHNIQUE: Multidetector CT imaging of the abdomen and pelvis was performed
using the standard protocol following bolus administration of
intravenous contrast. Sagittal and coronal MPR images reconstructed
from axial data set.
CONTRAST:  100mL OMNIPAQUE IOHEXOL 300 MG/ML SOLN IV. Dilute oral
contrast.

[Series 2: abd/ pelvis 5.0 i30f 1 · axial · 0.95mm/px · z∈[+704,+1089]mm · 13 of 87 slices shown, 15 images]
[im 5/87  soft-tissue]
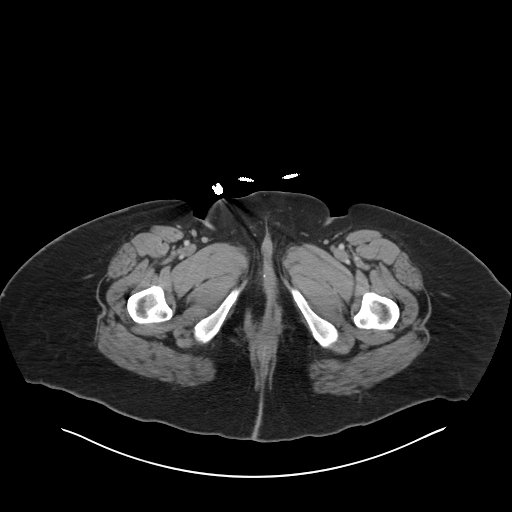
[im 5/87  bone]
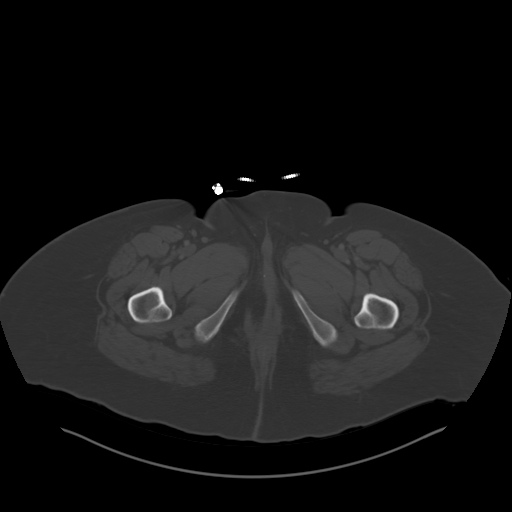
[im 13/87  soft-tissue]
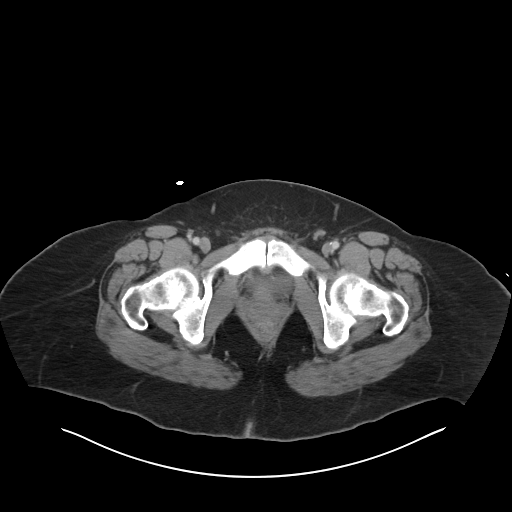
[im 18/87  soft-tissue]
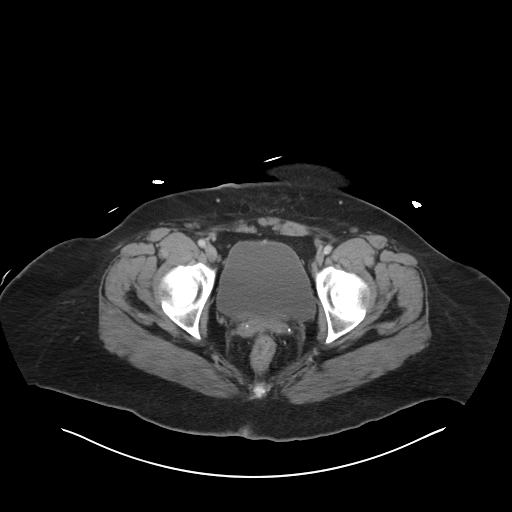
[im 26/87  soft-tissue]
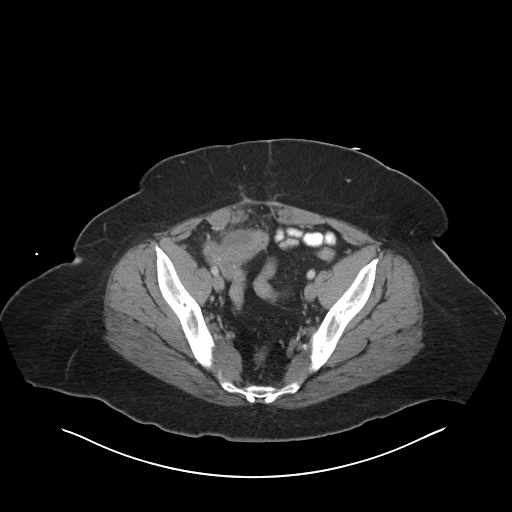
[im 31/87  soft-tissue]
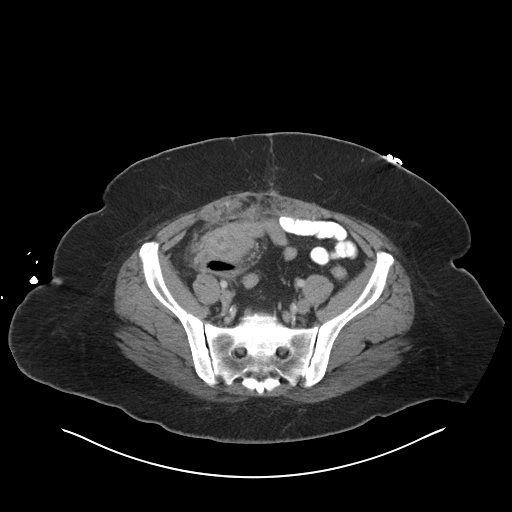
[im 39/87  soft-tissue]
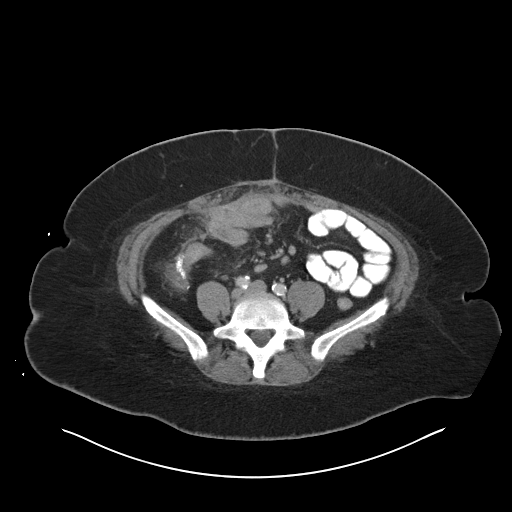
[im 44/87  soft-tissue]
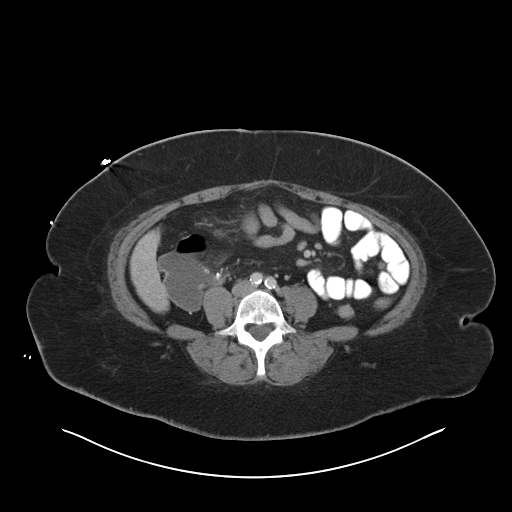
[im 48/87  soft-tissue]
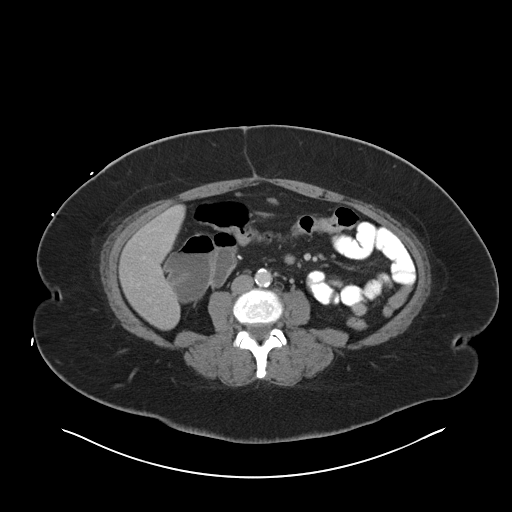
[im 56/87  soft-tissue]
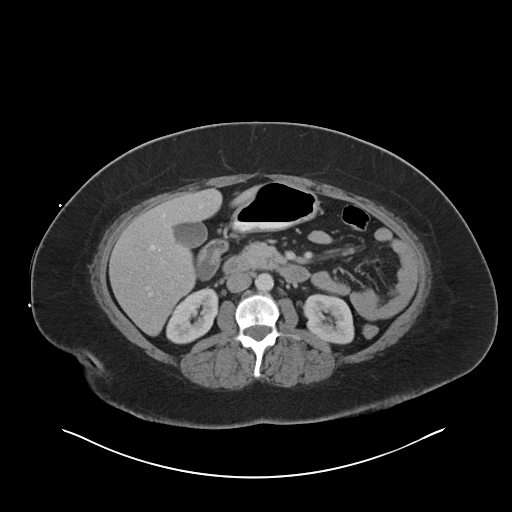
[im 56/87  bone]
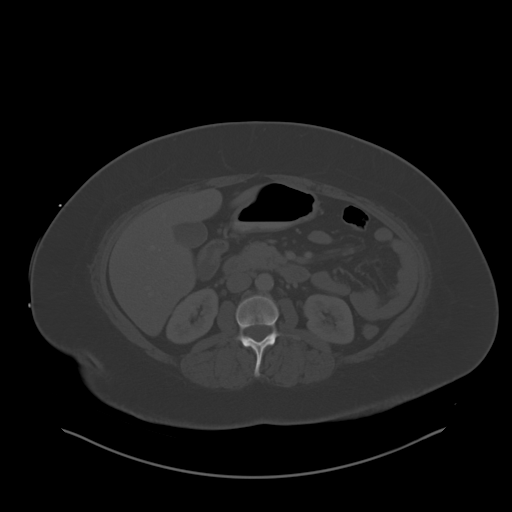
[im 61/87  soft-tissue]
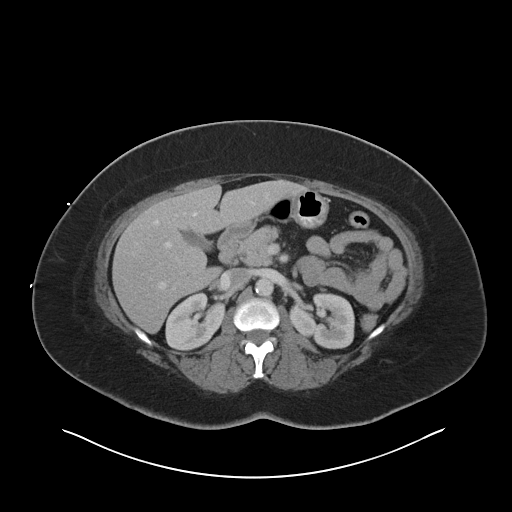
[im 69/87  soft-tissue]
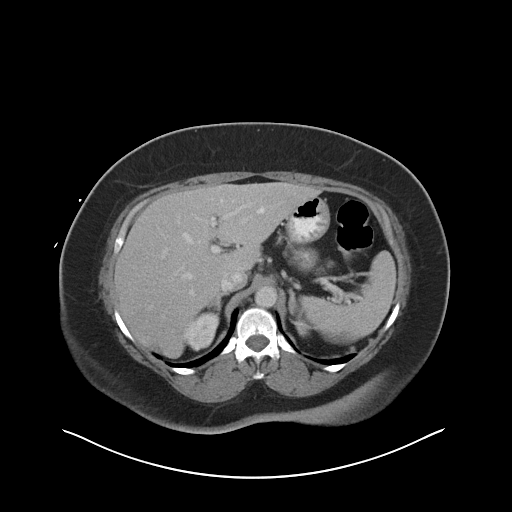
[im 74/87  soft-tissue]
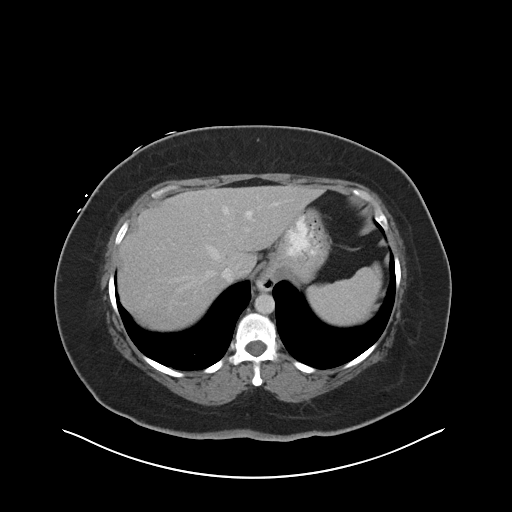
[im 82/87  soft-tissue]
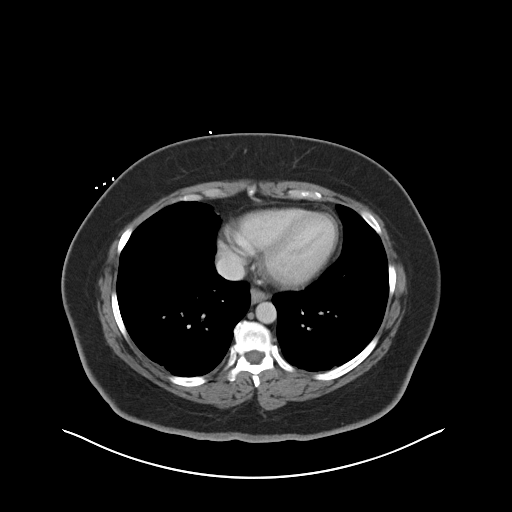

[Series 6: coronal soft tissue · coronal · 0.90mm/px · 3 of 101 slices shown]
[im 34/101  soft-tissue]
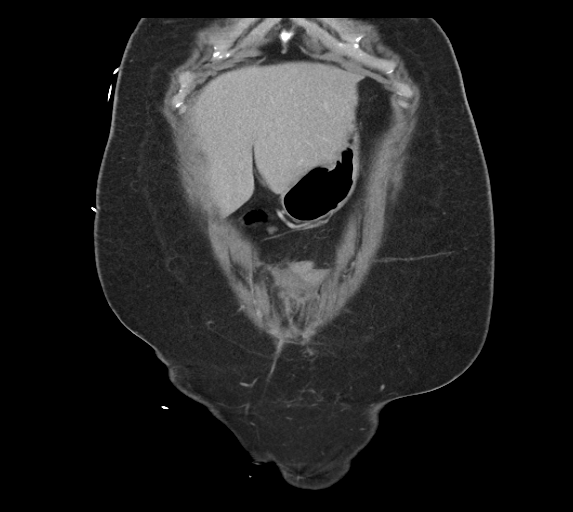
[im 45/101  soft-tissue]
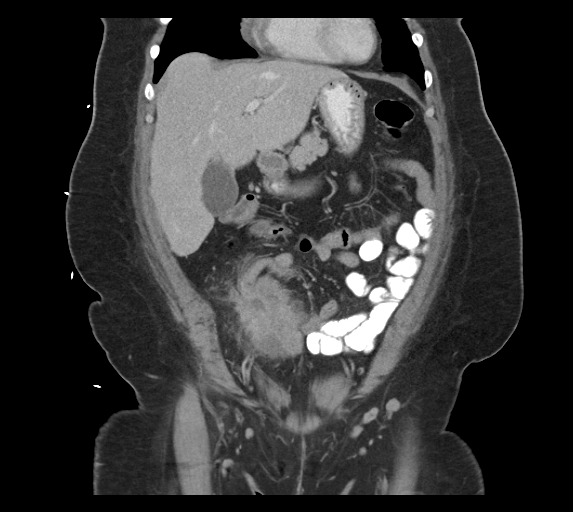
[im 56/101  soft-tissue]
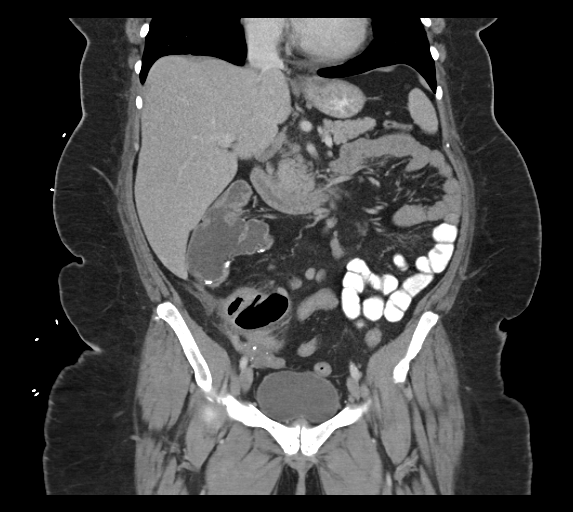

[16 of 46 positions shown; findings below may reference images not displayed]

FINDINGS: Minimal chronic bibasilar atelectasis.

Tiny cyst anteriorly in liver 5 mm diameter image 16 unchanged.

Liver, spleen, pancreas, kidneys, and adrenal glands otherwise
normal.

Prior ileocolic resection and anastomosis.

Focal inflammatory process identified in the RIGHT lower
quadrant/pelvis, where multiple clustered small bowel loops and
intervening and surrounding inflammatory changes are identified most
likely related to Crohn's disease.

Several small fluid collections are associated with this
inflammatory site though, due to lack of opacification of the distal
small bowel loops, it is uncertain whether these represent
unopacified small bowel loops or small abscess collections.

I am suspicious that at least the more anterior collection
represents an abscess as this demonstrates slight wall thickening
and enhancement, measuring 3.0 x 1.4 x potentially 3.7 cm.

No free intraperitoneal air or fluid identified.

Fluid filled nondistended RIGHT colon.

Remaining bowel loops normal without bowel dilatation or evidence of
obstruction.

Small hiatal hernia.

Numerous normal sized mesenteric lymph nodes without gross
adenopathy.

Atherosclerotic calcifications aorta.

No acute osseous findings.
IMPRESSION: Cluster of inflamed small bowel loops in the RIGHT mid
abdomen/pelvis consistent with Crohn's disease and significant
peri-intestinal inflammation.

Several fluid attenuation collections are associated with this
phlegmon, some which certainly represent small bowel loops but am
suspicious that there is at least an anterior abscess collection
measuring 3.0 x 1.4 x 3.7 cm.

No evidence of bowel obstruction or free intraperitoneal air.

## 2016-11-20 ENCOUNTER — Encounter: Payer: Medicare Other | Admitting: Internal Medicine

## 2016-11-30 ENCOUNTER — Other Ambulatory Visit: Payer: Self-pay

## 2016-11-30 NOTE — Telephone Encounter (Signed)
[facility] requesting refill for ClonazePAM. Please advise

## 2016-12-03 ENCOUNTER — Other Ambulatory Visit: Payer: Self-pay | Admitting: Internal Medicine

## 2016-12-03 DIAGNOSIS — F411 Generalized anxiety disorder: Secondary | ICD-10-CM | POA: Insufficient documentation

## 2016-12-03 MED ORDER — CLONAZEPAM 0.5 MG PO TABS: 0.5000 mg | ORAL_TABLET | Freq: Two times a day (BID) | ORAL | 0 refills | Status: DC | PRN

## 2016-12-03 NOTE — Telephone Encounter (Signed)
Faxed script back to [facility]...April May

## 2016-12-03 NOTE — Telephone Encounter (Signed)
RX written 

## 2016-12-10 ENCOUNTER — Other Ambulatory Visit: Payer: Self-pay | Admitting: Radiology

## 2016-12-15 ENCOUNTER — Telehealth: Payer: Self-pay | Admitting: Internal Medicine

## 2016-12-15 DIAGNOSIS — I10 Essential (primary) hypertension: Secondary | ICD-10-CM

## 2016-12-17 NOTE — Telephone Encounter (Signed)
Pt called regarding her hydrochlorothiazide (MICROZIDE) 12.5 MG capsule  She would like a fee days worth sent to [facility] and a 90 day supply sent to [facility], it is free for her through [facility] She would also like a call back in regard

## 2016-12-17 NOTE — Telephone Encounter (Signed)
Patient is requesting [facility] to be her only pharmacy.  Patient states she wants 90 day supplies of every medication to always be [facility].  Patient is out of this medication. I asked patient is she wanted a small script sent to another pharmacy.   Patient states that [facility] may give her medication from the 90 day supply sent to them.  Did not want a script sent at this time.  Was not sure if she would even pick up a small script b/c she would have to pay for it.  Patient states she is having a lot of confusion on her medications.  I have suggested to patient to make sure [facility] knows to take her medication off of automatic refills.

## 2016-12-18 ENCOUNTER — Telehealth: Payer: Self-pay

## 2016-12-18 DIAGNOSIS — I10 Essential (primary) hypertension: Secondary | ICD-10-CM

## 2016-12-18 MED ORDER — HYDROCHLOROTHIAZIDE 12.5 MG PO CAPS: 12.5000 mg | ORAL_CAPSULE | Freq: Every day | ORAL | 1 refills | Status: DC

## 2016-12-18 NOTE — Telephone Encounter (Signed)
This has been done.

## 2016-12-18 NOTE — Telephone Encounter (Signed)
[facility] is requesting a refill for HCTZ.   erx sent for 6 month supply.

## 2017-01-28 NOTE — Progress Notes (Addendum)
Subjective:   April May is a 61 y.o. female who presents for an Initial Medicare Annual Wellness Visit.  Review of Systems    No ROS.  Medicare Wellness Visit. Additional risk factors are reflected in the social history.     Sleep patterns: feels rested on waking, gets up 1 times nightly to void and sleeps 9-11 hours nightly. Patient states she gets enough rest but it is at different times of the day which has caused her not to have a consistent sleep schedule.  Sleep tips discussed with patient and education provided per AVS.  Home Safety/Smoke Alarms: Feels safe in home. Smoke alarms in place.  Living environment; residence and Firearm Safety: 1-story house/ trailer, no firearms. Lives alone, no needs for DME, has a limited support system Seat Belt Safety/Bike Helmet: Wears seat belt.    Objective:    There were no vitals filed for this visit. There is no height or weight on file to calculate BMI.   Current Medications (verified) Outpatient Encounter Prescriptions as of 01/31/2017  Medication Sig  . buPROPion (WELLBUTRIN XL) 150 MG 24 hr tablet TAKE 1 TABLET BY MOUTH ONCE A DAY IN EARLY AM  . clonazePAM (KLONOPIN) 0.5 MG tablet Take 1 tablet (0.5 mg total) by mouth 2 (two) times daily as needed for anxiety.  . fluticasone (FLONASE) 50 MCG/ACT nasal spray Place 2 sprays into both nostrils daily as needed for allergies or rhinitis.  . hydrochlorothiazide (MICROZIDE) 12.5 MG capsule Take 1 capsule (12.5 mg total) by mouth daily.  . Indacaterol-Glycopyrrolate (UTIBRON NEOHALER) 27.5-15.6 MCG CAPS Place 1 Act into inhaler and inhale 2 (two) times daily. (Patient taking differently: Place 1 Act into inhaler and inhale 2 (two) times daily as needed (shortness of breath). )  . lamoTRIgine (LAMICTAL) 200 MG tablet Take 200 mg by mouth at bedtime.  Marland Kitchen oxyCODONE-acetaminophen (PERCOCET/ROXICET) 5-325 MG tablet Take 1 tablet by mouth every 8 (eight) hours as needed for severe pain.    . potassium chloride SA (K-DUR,KLOR-CON) 20 MEQ tablet Take 1 tablet (20 mEq total) by mouth 3 (three) times daily.   No facility-administered encounter medications on file as of 01/31/2017.     Allergies (verified) Azathioprine; Crestor [rosuvastatin calcium]; Lipitor [atorvastatin calcium]; and Lisinopril   History: Past Medical History:  Diagnosis Date  . Allergy   . Crohn disease ([place])   . Depression   . Diabetes mellitus type II    borderline, md checks hemaglobin A 1 c at md office   . Hypertension   . Sleep apnea    does not use cpap since weight loss 1 and 1/2 years ago   Past Surgical History:  Procedure Laterality Date  . ABDOMINAL HYSTERECTOMY     1 ovary removed  . APPENDECTOMY    . APPLICATION OF WOUND VAC  11/24/2014   Procedure: APPLICATION OF WOUND VAC;  Surgeon: Donnie Mesa, MD;  Location: [facility];  Service: General;;  . BOWEL RESECTION N/A 11/24/2014   Procedure: SMALL BOWEL RESECTION;  Surgeon: Donnie Mesa, MD;  Location: [facility];  Service: General;  Laterality: N/A;  . BREAST EXCISIONAL BIOPSY Right 1972  . BREAST SURGERY Right 40 yrs ago   benign tumor removed  . LAPAROTOMY N/A 11/24/2014   Procedure: EXPLORATORY LAPAROTOMY EXTENSIVE LYSIS OF ADHESIONS SAMLL BOWEL RESECTION RIGHT HEMI COLECTOMY WOUND VAC APPLICATION. ;  Surgeon: Donnie Mesa, MD;  Location: [facility];  Service: General;  Laterality: N/A;  . LYSIS OF ADHESION  11/24/2014  Procedure: LYSIS OF ADHESION (3 HRS);  Surgeon: Donnie Mesa, MD;  Location: [facility];  Service: General;;  . PARTIAL COLECTOMY  11/24/2014   Procedure: RIGHT HEMI COLECTOMY;  Surgeon: Donnie Mesa, MD;  Location: [facility];  Service: General;;  . SMALL INTESTINE SURGERY  age 39   Family History  Problem Relation Age of Onset  . Hypertension Mother   . Arthritis Father   . Alcohol abuse Paternal Uncle   . Diabetes Paternal Grandmother   . Stroke Paternal Grandmother    Social History   Occupational History  . Not on  file.   Social History Main Topics  . Smoking status: Current Every Day Smoker    Packs/day: 1.00    Years: 35.00    Types: Cigarettes  . Smokeless tobacco: Never Used  . Alcohol use No  . Drug use: No  . Sexual activity: Not Currently    Tobacco Counseling Ready to quit: Not Answered Counseling given: Not Answered   Activities of Daily Living No flowsheet data found.  Immunizations and Health Maintenance Immunization History  Administered Date(s) Administered  . Influenza Split 04/04/2011, 02/15/2012  . Influenza Whole 04/30/2008, 04/01/2009  . Influenza,inj,Quad PF,6+ Mos 02/24/2015, 03/21/2016  . PPD Test 10/16/2013  . Pneumococcal Conjugate-13 02/24/2015  . Pneumococcal Polysaccharide-23 02/15/2012  . Td 12/31/2008  . Tdap 10/16/2013   Health Maintenance Due  Topic Date Due  . PAP SMEAR  05/14/2012  . OPHTHALMOLOGY EXAM  03/11/2015  . URINE MICROALBUMIN  02/24/2016  . FOOT EXAM  10/02/2016  . INFLUENZA VACCINE  12/12/2016    Patient Care Team: Janith Lima, MD as PCP - General (Internal Medicine)  Indicate any recent Medical Services you may have received from other than [facility] providers in the past year (date may be approximate).     Assessment:   This is a routine wellness examination for B[place].Physical assessment deferred to PCP.   Hearing/Vision screen No exam data present  Dietary issues and exercise activities discussed:   Diet (meal preparation, eat out, water intake, caffeinated beverages, dairy products, fruits and vegetables): in general, an "unhealthy" diet   Reviewed heart healthy and diabetic diet, encouraged patient to increase daily water intake. Discussed weight loss tips. Diet education was attached to patient's AVS. Relevant patient education assigned to patient using Emmi.  Goals    None     Depression Screen PHQ 2/9 Scores 08/08/2016 01/15/2013  PHQ - 2 Score 0 0    Fall Risk Fall Risk  08/08/2016  Falls in the past year?  No    Cognitive Function:       Ad8 score reviewed for issues:  Issues making decisions: no  Less interest in hobbies / activities: no  Repeats questions, stories (family complaining): no  Trouble using ordinary gadgets (microwave, computer, phone):no  Forgets the month or year: no  Mismanaging finances: no  Remembering appts: no  Daily problems with thinking and/or memory: no Ad8 score is= 0  Screening Tests Health Maintenance  Topic Date Due  . PAP SMEAR  05/14/2012  . OPHTHALMOLOGY EXAM  03/11/2015  . URINE MICROALBUMIN  02/24/2016  . FOOT EXAM  10/02/2016  . INFLUENZA VACCINE  12/12/2016  . PNEUMOCOCCAL POLYSACCHARIDE VACCINE (2) 02/14/2017  . HEMOGLOBIN A1C  04/27/2017  . MAMMOGRAM  08/23/2018  . COLONOSCOPY  05/15/2019  . TETANUS/TDAP  10/17/2023  . Hepatitis C Screening  Completed  . HIV Screening  Completed      Plan:    Continue  doing brain stimulating activities (puzzles, reading, adult coloring books, staying active) to keep memory sharp.   Start to eat heart healthy diet (full of fruits, vegetables, whole grains, lean protein, water--limit salt, fat, and sugar intake) and increase physical activity as tolerated.  I have personally reviewed and noted the following in the patient's chart:   . Medical and social history . Use of alcohol, tobacco or illicit drugs  . Current medications and supplements . Functional ability and status . Nutritional status . Physical activity . Advanced directives . List of other physicians . Vitals . Screenings to include cognitive, depression, and falls . Referrals and appointments  In addition, I have reviewed and discussed with patient certain preventive protocols, quality metrics, and best practice recommendations. A written personalized care plan for preventive services as well as general preventive health recommendations were provided to patient.     Michiel Cowboy, RN   01/28/2017   Medical screening  examination/treatment/procedure(s) were performed by non-physician practitioner and as supervising physician I was immediately available for consultation/collaboration. I agree with above. Scarlette Calico, MD

## 2017-01-28 NOTE — Progress Notes (Signed)
Pre visit review using our clinic review tool, if applicable. No additional management support is needed unless otherwise documented below in the visit note. 

## 2017-01-31 ENCOUNTER — Other Ambulatory Visit (INDEPENDENT_AMBULATORY_CARE_PROVIDER_SITE_OTHER): Payer: Medicare Other

## 2017-01-31 ENCOUNTER — Encounter: Payer: Self-pay | Admitting: Internal Medicine

## 2017-01-31 ENCOUNTER — Ambulatory Visit (INDEPENDENT_AMBULATORY_CARE_PROVIDER_SITE_OTHER): Payer: Medicare Other | Admitting: Internal Medicine

## 2017-01-31 VITALS — BP 132/78 | HR 79 | Temp 98.2°F | Resp 20 | Ht 63.0 in | Wt 220.0 lb

## 2017-01-31 DIAGNOSIS — I10 Essential (primary) hypertension: Secondary | ICD-10-CM

## 2017-01-31 DIAGNOSIS — Z23 Encounter for immunization: Secondary | ICD-10-CM | POA: Diagnosis not present

## 2017-01-31 DIAGNOSIS — E785 Hyperlipidemia, unspecified: Secondary | ICD-10-CM

## 2017-01-31 DIAGNOSIS — R7303 Prediabetes: Secondary | ICD-10-CM | POA: Diagnosis not present

## 2017-01-31 DIAGNOSIS — E669 Obesity, unspecified: Secondary | ICD-10-CM | POA: Diagnosis not present

## 2017-01-31 DIAGNOSIS — IMO0001 Reserved for inherently not codable concepts without codable children: Secondary | ICD-10-CM

## 2017-01-31 DIAGNOSIS — E876 Hypokalemia: Secondary | ICD-10-CM

## 2017-01-31 DIAGNOSIS — Z0000 Encounter for general adult medical examination without abnormal findings: Secondary | ICD-10-CM

## 2017-01-31 LAB — BASIC METABOLIC PANEL
BUN: 6 mg/dL (ref 6–23)
CHLORIDE: 97 meq/L (ref 96–112)
CO2: 40 meq/L — AB (ref 19–32)
Calcium: 9.6 mg/dL (ref 8.4–10.5)
Creatinine, Ser: 0.89 mg/dL (ref 0.40–1.20)
GFR: 82.85 mL/min (ref >60.00)
GLUCOSE: 103 mg/dL — AB (ref 70–99)
Potassium: 2.9 mEq/L — ABNORMAL LOW (ref 3.5–5.1)
Sodium: 142 mEq/L (ref 135–145)

## 2017-01-31 LAB — LIPID PANEL
Cholesterol: 125 mg/dL (ref 0–200)
HDL: 42.1 mg/dL (ref >39.00)
LDL CALC: 57 mg/dL (ref 0–99)
NonHDL: 82.58
Total CHOL/HDL Ratio: 3
Triglycerides: 129 mg/dL (ref 0.0–149.0)
VLDL: 25.8 mg/dL (ref 0.0–40.0)

## 2017-01-31 LAB — POCT UA - MICROALBUMIN
Creatinine, POC: 100 mg/dL
Microalbumin Ur, POC: 10 mg/L

## 2017-01-31 LAB — MAGNESIUM: MAGNESIUM: 1.8 mg/dL (ref 1.5–2.5)

## 2017-01-31 LAB — HEMOGLOBIN A1C: Hgb A1c MFr Bld: 6 % (ref 4.6–6.5)

## 2017-01-31 MED ORDER — POTASSIUM CHLORIDE CRYS ER 20 MEQ PO TBCR: 20.0000 meq | EXTENDED_RELEASE_TABLET | Freq: Three times a day (TID) | ORAL | 3 refills | Status: DC

## 2017-01-31 NOTE — Patient Instructions (Addendum)
Continue doing brain stimulating activities (puzzles, reading, adult coloring books, staying active) to keep memory sharp.   Start to eat heart healthy diet (full of fruits, vegetables, whole grains, lean protein, water--limit salt, fat, and sugar intake) and increase physical activity as tolerated.   Ms. April May , Thank you for taking time to come for your Medicare Wellness Visit. I appreciate your ongoing commitment to your health goals. Please review the following plan we discussed and let me know if I can assist you in the future.   These are the goals we discussed: Goals    . lose weight          Increase my physical activity by walking or chair exercises.       This is a list of the screening recommended for you and due dates:  Health Maintenance  Topic Date Due  . Pap Smear  05/14/2012  . Eye exam for diabetics  03/11/2015  . Urine Protein Check  02/24/2016  . Complete foot exam   10/02/2016  . Flu Shot  12/12/2016  . Pneumococcal vaccine (2) 02/14/2017  . Hemoglobin A1C  04/27/2017  . Mammogram  08/23/2018  . Colon Cancer Screening  05/15/2019  . Tetanus Vaccine  10/17/2023  .  Hepatitis C: One time screening is recommended by Center for Disease Control  (CDC) for  adults born from [birth year] through 1965.   Completed  . HIV Screening  Completed   Insomnia Insomnia is a sleep disorder that makes it difficult to fall asleep or to stay asleep. Insomnia can cause tiredness (fatigue), low energy, difficulty concentrating, mood swings, and poor performance at work or school. There are three different ways to classify insomnia:  Difficulty falling asleep.  Difficulty staying asleep.  Waking up too early in the morning.  Any type of insomnia can be long-term (chronic) or short-term (acute). Both are common. Short-term insomnia usually lasts for three months or less. Chronic insomnia occurs at least three times a week for longer than three months. What are the causes? Insomnia  may be caused by another condition, situation, or substance, such as:  Anxiety.  Certain medicines.  Gastroesophageal reflux disease (GERD) or other gastrointestinal conditions.  Asthma or other breathing conditions.  Restless legs syndrome, sleep apnea, or other sleep disorders.  Chronic pain.  Menopause. This may include hot flashes.  Stroke.  Abuse of alcohol, tobacco, or illegal drugs.  Depression.  Caffeine.  Neurological disorders, such as Alzheimer disease.  An overactive thyroid (hyperthyroidism).  The cause of insomnia may not be known. What increases the risk? Risk factors for insomnia include:  Gender. Women are more commonly affected than men.  Age. Insomnia is more common as you get older.  Stress. This may involve your professional or personal life.  Income. Insomnia is more common in people with lower income.  Lack of exercise.  Irregular work schedule or night shifts.  Traveling between different time zones.  What are the signs or symptoms? If you have insomnia, trouble falling asleep or trouble staying asleep is the main symptom. This may lead to other symptoms, such as:  Feeling fatigued.  Feeling nervous about going to sleep.  Not feeling rested in the morning.  Having trouble concentrating.  Feeling irritable, anxious, or depressed.  How is this treated? Treatment for insomnia depends on the cause. If your insomnia is caused by an underlying condition, treatment will focus on addressing the condition. Treatment may also include:  Medicines to help you sleep.  Counseling or therapy.  Lifestyle adjustments.  Follow these instructions at home:  Take medicines only as directed by your health care provider.  Keep regular sleeping and waking hours. Avoid naps.  Keep a sleep diary to help you and your health care provider figure out what could be causing your insomnia. Include: ? When you sleep. ? When you wake up during the  night. ? How well you sleep. ? How rested you feel the next day. ? Any side effects of medicines you are taking. ? What you eat and drink.  Make your bedroom a comfortable place where it is easy to fall asleep: ? Put up shades or special blackout curtains to block light from outside. ? Use a white noise machine to block noise. ? Keep the temperature cool.  Exercise regularly as directed by your health care provider. Avoid exercising right before bedtime.  Use relaxation techniques to manage stress. Ask your health care provider to suggest some techniques that may work well for you. These may include: ? Breathing exercises. ? Routines to release muscle tension. ? Visualizing peaceful scenes.  Cut back on alcohol, caffeinated beverages, and cigarettes, especially close to bedtime. These can disrupt your sleep.  Do not overeat or eat spicy foods right before bedtime. This can lead to digestive discomfort that can make it hard for you to sleep.  Limit screen use before bedtime. This includes: ? Watching TV. ? Using your smartphone, tablet, and computer.  Stick to a routine. This can help you fall asleep faster. Try to do a quiet activity, brush your teeth, and go to bed at the same time each night.  Get out of bed if you are still awake after 15 minutes of trying to sleep. Keep the lights down, but try reading or doing a quiet activity. When you feel sleepy, go back to bed.  Make sure that you drive carefully. Avoid driving if you feel very sleepy.  Keep all follow-up appointments as directed by your health care provider. This is important. Contact a health care provider if:  You are tired throughout the day or have trouble in your daily routine due to sleepiness.  You continue to have sleep problems or your sleep problems get worse. Get help right away if:  You have serious thoughts about hurting yourself or someone else. This information is not intended to replace advice given  to you by your health care provider. Make sure you discuss any questions you have with your health care provider. Document Released: 04/27/2000 Document Revised: 09/30/2015 Document Reviewed: 01/29/2014 Elsevier Interactive Patient Education  2018 [place]. Influenza Virus Vaccine (Flucelvax) What is this medicine? INFLUENZA VIRUS VACCINE (in floo EN zuh VAHY ruhs vak SEEN) helps to reduce the risk of getting influenza also known as the flu. The vaccine only helps protect you against some strains of the flu. This medicine may be used for other purposes; ask your health care provider or pharmacist if you have questions. COMMON BRAND NAME(S): FLUCELVAX What should I tell my health care provider before I take this medicine? They need to know if you have any of these conditions: -bleeding disorder like hemophilia -fever or infection -Guillain-Barre syndrome or other neurological problems -immune system problems -infection with the human immunodeficiency virus (HIV) or AIDS -low blood platelet counts -multiple sclerosis -an unusual or allergic reaction to influenza virus vaccine, other medicines, foods, dyes or preservatives -pregnant or trying to get pregnant -breast-feeding How should I use this medicine? This vaccine is  for injection into a muscle. It is given by a health care professional. A copy of Vaccine Information Statements will be given before each vaccination. Read this sheet carefully each time. The sheet may change frequently. Talk to your pediatrician regarding the use of this medicine in children. Special care may be needed. Overdosage: If you think you've taken too much of this medicine contact a poison control center or emergency room at once. Overdosage: If you think you have taken too much of this medicine contact a poison control center or emergency room at once. NOTE: This medicine is only for you. Do not share this medicine with others. What if I miss a dose? This  does not apply. What may interact with this medicine? -chemotherapy or radiation therapy -medicines that lower your immune system like etanercept, anakinra, infliximab, and adalimumab -medicines that treat or prevent blood clots like warfarin -phenytoin -steroid medicines like prednisone or cortisone -theophylline -vaccines This list may not describe all possible interactions. Give your health care provider a list of all the medicines, herbs, non-prescription drugs, or dietary supplements you use. Also tell them if you smoke, drink alcohol, or use illegal drugs. Some items may interact with your medicine. What should I watch for while using this medicine? Report any side effects that do not go away within 3 days to your doctor or health care professional. Call your health care provider if any unusual symptoms occur within 6 weeks of receiving this vaccine. You may still catch the flu, but the illness is not usually as bad. You cannot get the flu from the vaccine. The vaccine will not protect against colds or other illnesses that may cause fever. The vaccine is needed every year. What side effects may I notice from receiving this medicine? Side effects that you should report to your doctor or health care professional as soon as possible: -allergic reactions like skin rash, itching or hives, swelling of the face, lips, or tongue Side effects that usually do not require medical attention (Report these to your doctor or health care professional if they continue or are bothersome.): -fever -headache -muscle aches and pains -pain, tenderness, redness, or swelling at the injection site -tiredness This list may not describe all possible side effects. Call your doctor for medical advice about side effects. You may report side effects to FDA at 1-800-FDA-1088. Where should I keep my medicine? The vaccine will be given by a health care professional in a clinic, pharmacy, doctor's office, or other health  care setting. You will not be given vaccine doses to store at home. NOTE: This sheet is a summary. It may not cover all possible information. If you have questions about this medicine, talk to your doctor, pharmacist, or health care provider.  2018 Elsevier/Gold Standard (2011-04-11 14:06:47)

## 2017-01-31 NOTE — Progress Notes (Signed)
Subjective:  Patient ID: April May, female    DOB: 1955/12/05  Age: 61 y.o. MRN: [id number]  CC: Medicare Wellness; Hypertension; and Hyperlipidemia   HPI SANVIKA CUTTINO presents for f/up - her BP has been well controlled but she is only taking the K+ supplement QD instead of TID as prescribed. She feels well today.  Outpatient Medications Prior to Visit  Medication Sig Dispense Refill  . buPROPion (WELLBUTRIN XL) 150 MG 24 hr tablet TAKE 1 TABLET BY MOUTH ONCE A DAY IN EARLY AM  2  . clonazePAM (KLONOPIN) 0.5 MG tablet Take 1 tablet (0.5 mg total) by mouth 2 (two) times daily as needed for anxiety. 180 tablet 0  . fluticasone (FLONASE) 50 MCG/ACT nasal spray Place 2 sprays into both nostrils daily as needed for allergies or rhinitis. 42 g 3  . hydrochlorothiazide (MICROZIDE) 12.5 MG capsule Take 1 capsule (12.5 mg total) by mouth daily. 90 capsule 1  . Indacaterol-Glycopyrrolate (UTIBRON NEOHALER) 27.5-15.6 MCG CAPS Place 1 Act into inhaler and inhale 2 (two) times daily. (Patient taking differently: Place 1 Act into inhaler and inhale 2 (two) times daily as needed (shortness of breath). ) 60 capsule 11  . lamoTRIgine (LAMICTAL) 200 MG tablet Take 200 mg by mouth at bedtime.    Marland Kitchen oxyCODONE-acetaminophen (PERCOCET/ROXICET) 5-325 MG tablet Take 1 tablet by mouth every 8 (eight) hours as needed for severe pain. 12 tablet 0  . potassium chloride SA (K-DUR,KLOR-CON) 20 MEQ tablet Take 1 tablet (20 mEq total) by mouth 3 (three) times daily. 90 tablet 3   No facility-administered medications prior to visit.     ROS Review of Systems  Constitutional: Negative.  Negative for activity change, appetite change, chills, diaphoresis and fatigue.  HENT: Negative.   Eyes: Negative.  Negative for visual disturbance.  Respiratory: Negative.  Negative for cough, chest tightness, shortness of breath and wheezing.   Cardiovascular: Negative for chest pain, palpitations and leg swelling.    Gastrointestinal: Positive for diarrhea. Negative for abdominal pain, anal bleeding, blood in stool, constipation, nausea and vomiting.       Chronic unchanged diarrhea  Endocrine: Negative.   Genitourinary: Negative.  Negative for difficulty urinating.  Musculoskeletal: Negative.   Skin: Negative.  Negative for color change.  Allergic/Immunologic: Negative.   Neurological: Negative.  Negative for dizziness, weakness and light-headedness.  Hematological: Negative for adenopathy. Does not bruise/bleed easily.  Psychiatric/Behavioral: Negative.     Objective:  BP 132/78   Pulse 79   Temp 98.2 F (36.8 C)   Resp 20   Ht 5\' 3"  (1.6 m)   Wt 220 lb (99.8 kg)   LMP 05/14/1988   SpO2 99%   BMI 38.97 kg/m   BP Readings from Last 3 Encounters:  01/31/17 132/78  10/18/16 110/78  09/18/16 (!) 100/58    Wt Readings from Last 3 Encounters:  01/31/17 220 lb (99.8 kg)  10/18/16 217 lb (98.4 kg)  09/18/16 220 lb 4 oz (99.9 kg)    Physical Exam  Constitutional: She is oriented to person, place, and time. No distress.  HENT:  Mouth/Throat: Oropharynx is clear and moist. No oropharyngeal exudate.  Eyes: Conjunctivae are normal. Right eye exhibits no discharge. Left eye exhibits no discharge. No scleral icterus.  Neck: Normal range of motion. Neck supple. No JVD present. No thyromegaly present.  Cardiovascular: Normal rate, regular rhythm and intact distal pulses.  Exam reveals no gallop and no friction rub.   No murmur heard. Pulmonary/Chest: Effort  normal and breath sounds normal. No respiratory distress. She has no wheezes. She has no rales. She exhibits no tenderness.  Abdominal: Soft. Bowel sounds are normal. She exhibits no distension and no mass. There is no tenderness. There is no rebound and no guarding.  Musculoskeletal: Normal range of motion. She exhibits no edema, tenderness or deformity.  Lymphadenopathy:    She has no cervical adenopathy.  Neurological: She is alert and  oriented to person, place, and time.  Skin: Skin is warm and dry. No rash noted. She is not diaphoretic. No erythema. No pallor.  Vitals reviewed.   Lab Results  Component Value Date   WBC 10.9 (H) 09/18/2016   HGB 13.8 09/18/2016   HCT 40.4 09/18/2016   PLT 330.0 09/18/2016   GLUCOSE 103 (H) 01/31/2017   CHOL 125 01/31/2017   TRIG 129.0 01/31/2017   HDL 42.10 01/31/2017   LDLCALC 57 01/31/2017   ALT 15 09/18/2016   AST 14 09/18/2016   NA 142 01/31/2017   K 2.9 (L) 01/31/2017   CL 97 01/31/2017   CREATININE 0.89 01/31/2017   BUN 6 01/31/2017   CO2 40 (H) 01/31/2017   TSH 0.75 06/25/2016   HGBA1C 6.0 01/31/2017   MICROALBUR 10 01/31/2017    Mm Screening Breast Tomo Bilateral  Result Date: 08/22/2016 CLINICAL DATA:  Screening. EXAM: 2D DIGITAL SCREENING BILATERAL MAMMOGRAM WITH CAD AND ADJUNCT TOMO COMPARISON:  Previous exam(s). ACR Breast Density Category b: There are scattered areas of fibroglandular density. FINDINGS: There are no findings suspicious for malignancy. Images were processed with CAD. IMPRESSION: No mammographic evidence of malignancy. A result letter of this screening mammogram will be mailed directly to the patient. RECOMMENDATION: Screening mammogram in one year. (Code:SM-B-01Y) BI-RADS CATEGORY  1: Negative. Electronically Signed   By: Ammie Ferrier M.D.   On: 08/22/2016 08:01    Assessment & Plan:   Darthula was seen today for medicare wellness, hypertension and hyperlipidemia.  Diagnoses and all orders for this visit:  HYPERTENSION, BENIGN ESSENTIAL- her BP is well controlled, I have asked her to take the K+ TID as prscribed -     Basic metabolic panel; Future -     Magnesium; Future -     potassium chloride SA (K-DUR,KLOR-CON) 20 MEQ tablet; Take 1 tablet (20 mEq total) by mouth 3 (three) times daily.  Prediabetes- her A1C is at 6.0%, her blood sugars are well controlled -     Basic metabolic panel; Future -     Hemoglobin A1c; Future -     POCT  UA - Microalbumin  Hyperlipidemia LDL goal <130- statin tx is not indicated -     Lipid panel; Future  Need for influenza vaccination -     Flu Vaccine QUAD 6+ mos PF IM (Fluarix Quad PF)  Encounter for Medicare annual wellness exam  Obesity, Class II, BMI 35-39.9, with comorbidity- she agrees to work on her lifestyle modifications to lose weight  Hypokalemia- her K+ is down to 2.9 so I have asked her to take the KDur TID as prescribed -     potassium chloride SA (K-DUR,KLOR-CON) 20 MEQ tablet; Take 1 tablet (20 mEq total) by mouth 3 (three) times daily.   I am having Ms. Liebler maintain her lamoTRIgine, buPROPion, Indacaterol-Glycopyrrolate, oxyCODONE-acetaminophen, fluticasone, clonazePAM, hydrochlorothiazide, and potassium chloride SA.  Meds ordered this encounter  Medications  . potassium chloride SA (K-DUR,KLOR-CON) 20 MEQ tablet    Sig: Take 1 tablet (20 mEq total) by mouth 3 (three)  times daily.    Dispense:  90 tablet    Refill:  3     Follow-up: Return in about 6 months (around 07/31/2017).  Scarlette Calico, MD

## 2017-02-01 ENCOUNTER — Encounter: Payer: Self-pay | Admitting: Internal Medicine

## 2017-03-12 ENCOUNTER — Emergency Department (HOSPITAL_COMMUNITY): Payer: Medicare Other

## 2017-03-12 ENCOUNTER — Telehealth: Payer: Self-pay | Admitting: Internal Medicine

## 2017-03-12 ENCOUNTER — Encounter (HOSPITAL_COMMUNITY): Payer: Self-pay | Admitting: Emergency Medicine

## 2017-03-12 ENCOUNTER — Emergency Department (HOSPITAL_COMMUNITY)
Admission: EM | Admit: 2017-03-12 | Discharge: 2017-03-12 | Disposition: A | Discharge status: Home / Self Care | Payer: Medicare Other | Attending: Emergency Medicine | Admitting: Emergency Medicine

## 2017-03-12 DIAGNOSIS — J449 Chronic obstructive pulmonary disease, unspecified: Secondary | ICD-10-CM | POA: Insufficient documentation

## 2017-03-12 DIAGNOSIS — I10 Essential (primary) hypertension: Secondary | ICD-10-CM | POA: Insufficient documentation

## 2017-03-12 DIAGNOSIS — J441 Chronic obstructive pulmonary disease with (acute) exacerbation: Secondary | ICD-10-CM

## 2017-03-12 DIAGNOSIS — R079 Chest pain, unspecified: Secondary | ICD-10-CM | POA: Diagnosis not present

## 2017-03-12 DIAGNOSIS — R05 Cough: Secondary | ICD-10-CM | POA: Diagnosis not present

## 2017-03-12 DIAGNOSIS — F17210 Nicotine dependence, cigarettes, uncomplicated: Secondary | ICD-10-CM | POA: Insufficient documentation

## 2017-03-12 DIAGNOSIS — E119 Type 2 diabetes mellitus without complications: Secondary | ICD-10-CM | POA: Insufficient documentation

## 2017-03-12 DIAGNOSIS — R0602 Shortness of breath: Secondary | ICD-10-CM | POA: Diagnosis not present

## 2017-03-12 DIAGNOSIS — Z79899 Other long term (current) drug therapy: Secondary | ICD-10-CM | POA: Diagnosis not present

## 2017-03-12 LAB — CBC
HCT: 42.6 % (ref 36.0–46.0)
HEMOGLOBIN: 14.1 g/dL (ref 12.0–15.0)
MCH: 30.3 pg (ref 26.0–34.0)
MCHC: 33.1 g/dL (ref 30.0–36.0)
MCV: 91.4 fL (ref 78.0–100.0)
PLATELETS: 266 10*3/uL (ref 150–400)
RBC: 4.66 MIL/uL (ref 3.87–5.11)
RDW: 15.3 % (ref 11.5–15.5)
WBC: 9.8 10*3/uL (ref 4.0–10.5)

## 2017-03-12 LAB — BASIC METABOLIC PANEL
ANION GAP: 9 (ref 5–15)
BUN: 7 mg/dL (ref 6–20)
CALCIUM: 8.9 mg/dL (ref 8.9–10.3)
CO2: 22 mmol/L (ref 22–32)
CREATININE: 0.77 mg/dL (ref 0.44–1.00)
Chloride: 108 mmol/L (ref 101–111)
Glucose, Bld: 95 mg/dL (ref 65–99)
Potassium: 3 mmol/L — ABNORMAL LOW (ref 3.5–5.1)
SODIUM: 139 mmol/L (ref 135–145)

## 2017-03-12 LAB — I-STAT TROPONIN, ED
TROPONIN I, POC: 0 ng/mL (ref 0.00–0.08)
TROPONIN I, POC: 0 ng/mL (ref 0.00–0.08)

## 2017-03-12 MED ORDER — IPRATROPIUM-ALBUTEROL 0.5-2.5 (3) MG/3ML IN SOLN
3.0000 mL | Freq: Once | RESPIRATORY_TRACT | Status: AC
  Administered 2017-03-12: 3 mL via RESPIRATORY_TRACT
  Filled 2017-03-12: qty 3

## 2017-03-12 MED ORDER — METHYLPREDNISOLONE SODIUM SUCC 125 MG IJ SOLR
125.0000 mg | Freq: Once | INTRAMUSCULAR | Status: DC
  Filled 2017-03-12: qty 2

## 2017-03-12 MED ORDER — PREDNISONE 10 MG PO TABS: 20.0000 mg | ORAL_TABLET | Freq: Every day | ORAL | 0 refills | Status: DC

## 2017-03-12 MED ORDER — AZITHROMYCIN 250 MG PO TABS: 250.0000 mg | ORAL_TABLET | Freq: Once | ORAL | 0 refills | Status: AC

## 2017-03-12 NOTE — ED Notes (Signed)
Pt left at this time with all belongings.  

## 2017-03-12 NOTE — ED Provider Notes (Signed)
[facility] Provider Note   CSN: [id number] Arrival date & time: 03/12/17  1347     History   Chief Complaint Chief Complaint  Patient presents with  . Chest Pain    HPI April May is a 61 y.o. female with h/o of obesity, tobacco abuse, COPD, sleep apnea, HTN, prediabetes, HLD, chronic hypokalemia, Chron's disease presents to ED for evaluation of persistent cough with increased sputum x 2 weeks and new chest tightness, wheezing and right sided chest wall pain with cough and moving x 3-4 days. No fevers, chills, nausea, vomiting, exertional or pleuritic CP, abdominal pain.  Has h/o diarrhea due to Chron's, unchanged. Pt states she uses her COPD inhaler "as needed", however chart review reveals pt is supposed to use inhaler twice daily. She states she was not aware of this. Continues to smoke cigarettes. Denies previous h/o COPD exacerbation, DVT/PE, orthopnea, PND, LE edema, h/o CHF or CAD.   HPI  Past Medical History:  Diagnosis Date  . Allergy   . Crohn disease ([place])   . Depression   . Diabetes mellitus type II    borderline, md checks hemaglobin A 1 c at md office   . Hypertension   . Sleep apnea    does not use cpap since weight loss 1 and 1/2 years ago    Patient Active Problem List   Diagnosis Date Noted  . GAD (generalized anxiety disorder) 12/03/2016  . Aortic ejection murmur 03/25/2016  . Crohn's disease of both small and large intestine with fistula ([place]) 11/24/2014  . Crohn's regional enteritis ([place]) 05/20/2014  . COPD with chronic bronchitis ([place]) 03/11/2014  . Hyperlipidemia LDL goal <130 06/13/2011  . Sleep apnea 04/27/2011  . Prediabetes 03/01/2011  . Obesity, Class II, BMI 35-39.9, with comorbidity 04/01/2009  . TOBACCO ABUSE 02/24/2008  . ALLERGIC RHINITIS 02/24/2008  . DEPRESSION 08/26/2007  . HYPERTENSION, BENIGN ESSENTIAL 08/04/2007    Past Surgical History:  Procedure Laterality Date  . ABDOMINAL  HYSTERECTOMY     1 ovary removed  . APPENDECTOMY    . APPLICATION OF WOUND VAC  11/24/2014   Procedure: APPLICATION OF WOUND VAC;  Surgeon: Donnie Mesa, MD;  Location: [facility];  Service: General;;  . BOWEL RESECTION N/A 11/24/2014   Procedure: SMALL BOWEL RESECTION;  Surgeon: Donnie Mesa, MD;  Location: [facility];  Service: General;  Laterality: N/A;  . BREAST EXCISIONAL BIOPSY Right 1972  . BREAST SURGERY Right 40 yrs ago   benign tumor removed  . LAPAROTOMY N/A 11/24/2014   Procedure: EXPLORATORY LAPAROTOMY EXTENSIVE LYSIS OF ADHESIONS SAMLL BOWEL RESECTION RIGHT HEMI COLECTOMY WOUND VAC APPLICATION. ;  Surgeon: Donnie Mesa, MD;  Location: [facility];  Service: General;  Laterality: N/A;  . LYSIS OF ADHESION  11/24/2014   Procedure: LYSIS OF ADHESION (3 HRS);  Surgeon: Donnie Mesa, MD;  Location: [facility];  Service: General;;  . PARTIAL COLECTOMY  11/24/2014   Procedure: RIGHT HEMI COLECTOMY;  Surgeon: Donnie Mesa, MD;  Location: [facility];  Service: General;;  . SMALL INTESTINE SURGERY  age 62    OB History    No data available       Home Medications    Prior to Admission medications   Medication Sig Start Date End Date Taking? Authorizing Provider  buPROPion (WELLBUTRIN XL) 150 MG 24 hr tablet TAKE 1 TABLET BY MOUTH ONCE A DAY IN EARLY AM 01/15/16  Yes [provider]  clonazePAM (KLONOPIN) 0.5 MG tablet Take  1 tablet (0.5 mg total) by mouth 2 (two) times daily as needed for anxiety. 12/03/16  Yes Janith Lima, MD  fluticasone (FLONASE) 50 MCG/ACT nasal spray Place 2 sprays into both nostrils daily as needed for allergies or rhinitis. 09/21/16  Yes Janith Lima, MD  Indacaterol-Glycopyrrolate (UTIBRON NEOHALER) 27.5-15.6 MCG CAPS Place 1 Act into inhaler and inhale 2 (two) times daily. Patient taking differently: Place 1 Act into inhaler and inhale 2 (two) times daily as needed (shortness of breath).  06/25/16  Yes Janith Lima, MD  lamoTRIgine (LAMICTAL) 200 MG tablet Take  200 mg by mouth at bedtime.   Yes [provider]  nebivolol (BYSTOLIC) 5 MG tablet Take 5 mg by mouth daily.   Yes [provider]  oxyCODONE-acetaminophen (PERCOCET/ROXICET) 5-325 MG tablet Take 1 tablet by mouth every 8 (eight) hours as needed for severe pain. 07/27/16  Yes Varney Biles, MD  potassium chloride SA (K-DUR,KLOR-CON) 20 MEQ tablet Take 1 tablet (20 mEq total) by mouth 3 (three) times daily. 01/31/17  Yes Janith Lima, MD  azithromycin (ZITHROMAX Z-PAK) 250 MG tablet Take 1 tablet (250 mg total) by mouth once. Take 500 mg on day 1, then 250 mg on day 2, 3, 4 and 5. 03/12/17 03/12/17  Kinnie Feil, PA-C  predniSONE (DELTASONE) 10 MG tablet Take 2 tablets (20 mg total) by mouth daily. 03/12/17   Kinnie Feil, PA-C    Family History Family History  Problem Relation Age of Onset  . Hypertension Mother   . Arthritis Father   . Alcohol abuse Paternal Uncle   . Diabetes Paternal Grandmother   . Stroke Paternal Grandmother     Social History Social History  Substance Use Topics  . Smoking status: Current Every Day Smoker    Packs/day: 1.00    Years: 35.00    Types: Cigarettes  . Smokeless tobacco: Never Used  . Alcohol use No     Allergies   Azathioprine; Crestor [rosuvastatin calcium]; Lipitor [atorvastatin calcium]; and Lisinopril   Review of Systems Review of Systems  Constitutional: Negative for chills and fever.  HENT: Negative for congestion and sore throat.   Respiratory: Positive for cough, chest tightness and wheezing. Negative for shortness of breath.   Cardiovascular: Positive for chest pain (chest wall pain with cough and movement only).  Gastrointestinal: Positive for diarrhea (chronic). Negative for abdominal pain, constipation, nausea and vomiting.  Genitourinary: Negative for difficulty urinating, dysuria and hematuria.  Musculoskeletal: Negative for myalgias.  Skin: Negative for rash.  Neurological: Negative for  light-headedness and headaches.     Physical Exam Updated Vital Signs BP (!) 158/67 (BP Location: Right Arm)   Pulse 62   Temp 97.7 F (36.5 C) (Oral)   Resp 20   LMP 05/14/1988   SpO2 99%   Physical Exam  Constitutional: She is oriented to person, place, and time. She appears well-developed and well-nourished. No distress.  NAD.  HENT:  Head: Normocephalic and atraumatic.  Right Ear: External ear normal.  Left Ear: External ear normal.  Nose: Nose normal.  Mouth/Throat: Oropharynx is clear and moist. No oropharyngeal exudate.  Eyes: Pupils are equal, round, and reactive to light. Conjunctivae and EOM are normal. No scleral icterus.  Neck: Normal range of motion. Neck supple. No JVD present.  Cardiovascular: Normal rate, regular rhythm, S1 normal, S2 normal, normal heart sounds and intact distal pulses.   No murmur heard. Pulses:      Radial pulses are 2+  on the right side, and 2+ on the left side.       Dorsalis pedis pulses are 2+ on the right side, and 2+ on the left side.  No LE edema. No orthopnea or S3 Calves supple and non tender  Pulmonary/Chest: Effort normal. No respiratory distress. She has wheezes in the right upper field, the right middle field, the right lower field, the left upper field and the left lower field. She has no rhonchi. She has no rales. She exhibits tenderness.  Expiratory wheezing in all lung fields Right sided sternal/chest wall tenderness Normal breathing effort  Abdominal: Soft. Bowel sounds are normal. There is no tenderness.  Musculoskeletal: Normal range of motion. She exhibits no deformity.  Lymphadenopathy:    She has no cervical adenopathy.  Neurological: She is alert and oriented to person, place, and time. No sensory deficit.  Skin: Skin is warm and dry. Capillary refill takes less than 2 seconds.  Psychiatric: She has a normal mood and affect. Her behavior is normal. Judgment and thought content normal.  Nursing note and vitals  reviewed.    ED Treatments / Results  Labs (all labs ordered are listed, but only abnormal results are displayed) Labs Reviewed  BASIC METABOLIC PANEL - Abnormal; Notable for the following:       Result Value   Potassium 3.0 (*)    All other components within normal limits  CBC  I-STAT TROPONIN, ED  I-STAT TROPONIN, ED    EKG  EKG Interpretation None       Radiology Dg Chest 2 View  Result Date: 03/12/2017 CLINICAL DATA:  Productive cough for 2 weeks. Chest pain with shortness of breath. Hypertension, diabetes. EXAM: CHEST  2 VIEW COMPARISON:  04/19/2016. FINDINGS: Mild cardiomegaly. Mild bronchitic change. No consolidation or edema. No effusion or pneumothorax. Bones unremarkable. IMPRESSION: Mild cardiomegaly.  No active cardiopulmonary disease. Electronically Signed   By: Staci Righter M.D.   On: 03/12/2017 14:34    Procedures Procedures (including critical care time)  Medications Ordered in ED Medications  methylPREDNISolone sodium succinate (SOLU-MEDROL) 125 mg/2 mL injection 125 mg (125 mg Intravenous Refused 03/12/17 2100)  ipratropium-albuterol (DUONEB) 0.5-2.5 (3) MG/3ML nebulizer solution 3 mL (3 mLs Nebulization Given 03/12/17 2052)  ipratropium-albuterol (DUONEB) 0.5-2.5 (3) MG/3ML nebulizer solution 3 mL (3 mLs Nebulization Given 03/12/17 2141)     Initial Impression / Assessment and Plan / ED Course  I have reviewed the triage vital signs and the nursing notes.  Pertinent labs & imaging results that were available during my care of the patient were reviewed by me and considered in my medical decision making (see chart for details).  Clinical Course as of Mar 12 2346  Tue Mar 12, 2017  2119 RE-evaluated pt who states chest pressure has resolved and feels like she "can breathe better". Lung sounds have improved, faint wheezing in RLL. Will repeat duoneb and ambulate.   [CG]    Clinical Course User Index [CG] Kinnie Feil, PA-C   61 year old  female presents with chest tightness, cough with increased sputum, right-sided chest wall pain with coughing, palpation and movement. She reports medical noncompliance with COPD inhaler, is only using it as needed and infrequently when she is supposed to use it twice daily. She continues to smoke cigarettes. On exam, she has diffuse expiratory wheezing in all lung fields. No tachypnea or hypoxia. Normal work of breathing. She does not look fluid overloaded. Lab work including CBC, BMP, troponin 2, chest x-ray, EKG unremarkable.  Patient given prednisone orally because she refused IV Solu-Medrol as well as DuoNeb 2. Repeat lung exam significantly improved. Patient reports complete resolution of chest tightness. She has ambulated in the emergency department without desaturation or symptoms. Patient has risk factors for CAD including obesity, tobacco, hyperlipidemia and hypertension and advanced age however she is denying exertional chest pain or shortness of breath. HEART score = 3. Doubt ACS in this patient. Suspect COPD exacerbation vs acute bronchitis. Pt was discharged with abx, prednisone and f/u as outpatient. Discussed s/s that would warrant return to ED.   Final Clinical Impressions(s) / ED Diagnoses   Final diagnoses:  COPD exacerbation ([place])    New Prescriptions Discharge Medication List as of 03/12/2017 10:14 PM    START taking these medications   Details  azithromycin (ZITHROMAX Z-PAK) 250 MG tablet Take 1 tablet (250 mg total) by mouth once. Take 500 mg on day 1, then 250 mg on day 2, 3, 4 and 5., Starting Tue 03/12/2017, Print    predniSONE (DELTASONE) 10 MG tablet Take 2 tablets (20 mg total) by mouth daily., Starting Tue 03/12/2017, Print         Kinnie Feil, PA-C 03/12/17 2356    Isla Pence, MD 03/13/17 0005

## 2017-03-12 NOTE — ED Triage Notes (Signed)
Cp started this am had sob   No vomiting has had bad cough x 2 weeks

## 2017-03-12 NOTE — ED Notes (Signed)
Walked pt in hall, pt did well and stayed in the 90s the whole time.

## 2017-03-12 NOTE — Discharge Instructions (Signed)
Your lab work, chest x-ray, EKG and heart enzymes were normal.   You felt better and your lung sounds improved after breathing treatments. Your oxygen level was normal.   I suspect you are having a COPD exacerbation. Take antibiotic and prednisone as prescribed. Use your inhaler TWICE DAILY.   Follow up with your primary care provider within 1 week for further discussion of COPD and medication regimen, he/she may refer you to pulmonology.  Return to ED for exertional chest pain, shortness of breath, light-headedness, fevers

## 2017-03-12 NOTE — Telephone Encounter (Signed)
[facility] - Client [place] [facility] Patient Name: April May DOB: 06-23-1955 Initial Comment Caller states she has been coughing for about two weeks, phlegm in her throat but it is not thick and it is clear. This morning she has been experiencing chest pains, not consistent but when she breathes or coughs she feels the pain Nurse Assessment Nurse: Markus Daft, RN, Sherre Poot Date/Time (Eastern Time): 03/12/2017 12:41:36 PM Confirm and document reason for call. If symptomatic, describe symptoms. ---Caller states she has been productive cough for last 2 wks. Clear drainage. This morning she has been experiencing chest pains/pressure constant, and pain increases with coughing or deep breathing. No stuffiness. No obvious fever. Does the patient have any new or worsening symptoms? ---Yes Will a triage be completed? ---Yes Related visit to physician within the last 2 weeks? ---No Does the PT have any chronic conditions? (i.e. diabetes, asthma, etc.) ---Yes List chronic conditions. ---COPD, Crohn's, borderline DM, Depression Is this a behavioral health or substance abuse call? ---No Guidelines Guideline Title Affirmed Question Affirmed Notes Cough - Acute Productive Chest pain (Exception: MILD central chest pain, present only when coughing) Final Disposition User Go to ED Now Markus Daft, RN, [place] Comments Mild - moderate chest pain Referrals GO TO FACILITY UNDECIDED Caller Disagree/Comply Comply Caller Understands Yes PreDisposition Did not know what to do

## 2017-03-12 NOTE — ED Notes (Signed)
Attempted 24G IV access R hand, unsuccessful

## 2017-03-18 ENCOUNTER — Encounter: Payer: Self-pay | Admitting: Internal Medicine

## 2017-03-19 NOTE — Telephone Encounter (Signed)
Pt called back and wanted to make an appointment, she can only do morning appointments and cannot do Thursday or Friday, we do not have anything tomorrow morning, I told her I do not have anything until Monday and she did not like my response and would like to talk to you, please call back in regard

## 2017-03-25 ENCOUNTER — Encounter: Payer: Self-pay | Admitting: Internal Medicine

## 2017-03-25 ENCOUNTER — Ambulatory Visit (INDEPENDENT_AMBULATORY_CARE_PROVIDER_SITE_OTHER): Payer: Medicare Other | Admitting: Internal Medicine

## 2017-03-25 ENCOUNTER — Other Ambulatory Visit (INDEPENDENT_AMBULATORY_CARE_PROVIDER_SITE_OTHER): Payer: Medicare Other

## 2017-03-25 VITALS — BP 126/82 | HR 67 | Temp 98.5°F | Resp 16 | Wt 218.0 lb

## 2017-03-25 DIAGNOSIS — T502X5A Adverse effect of carbonic-anhydrase inhibitors, benzothiadiazides and other diuretics, initial encounter: Secondary | ICD-10-CM | POA: Diagnosis not present

## 2017-03-25 DIAGNOSIS — I10 Essential (primary) hypertension: Secondary | ICD-10-CM

## 2017-03-25 DIAGNOSIS — E876 Hypokalemia: Secondary | ICD-10-CM

## 2017-03-25 DIAGNOSIS — J449 Chronic obstructive pulmonary disease, unspecified: Secondary | ICD-10-CM

## 2017-03-25 LAB — BASIC METABOLIC PANEL
BUN: 7 mg/dL (ref 6–23)
CHLORIDE: 99 meq/L (ref 96–112)
CO2: 34 mEq/L — ABNORMAL HIGH (ref 19–32)
Calcium: 10 mg/dL (ref 8.4–10.5)
Creatinine, Ser: 0.78 mg/dL (ref 0.40–1.20)
GFR: 96.43 mL/min (ref >60.00)
Glucose, Bld: 98 mg/dL (ref 70–99)
POTASSIUM: 4 meq/L (ref 3.5–5.1)
SODIUM: 141 meq/L (ref 135–145)

## 2017-03-25 LAB — MAGNESIUM: MAGNESIUM: 1.9 mg/dL (ref 1.5–2.5)

## 2017-03-25 MED ORDER — NEBIVOLOL HCL 5 MG PO TABS: 5.0000 mg | ORAL_TABLET | Freq: Every day | ORAL | 1 refills | Status: DC

## 2017-03-25 NOTE — Progress Notes (Signed)
Subjective:  Patient ID: April May, female    DOB: 07/14/1955  Age: 61 y.o. MRN: [id number]  CC: Hypertension and COPD   HPI April May presents for f/up - she was recently seen in the ED for a COPD exacerbation.  She was prescribed prednisone but did not take it because she did not understand the instructions.  Fortunately, her cough has resolved.  She feels well today and offers no complaints.  Her blood pressure has been well controlled.  She was previous prescribed hydrochlorothiazide but developed significant hypokalemia.  Outpatient Medications Prior to Visit  Medication Sig Dispense Refill  . buPROPion (WELLBUTRIN XL) 150 MG 24 hr tablet TAKE 1 TABLET BY MOUTH ONCE A DAY IN EARLY AM  2  . clonazePAM (KLONOPIN) 0.5 MG tablet Take 1 tablet (0.5 mg total) by mouth 2 (two) times daily as needed for anxiety. 180 tablet 0  . fluticasone (FLONASE) 50 MCG/ACT nasal spray Place 2 sprays into both nostrils daily as needed for allergies or rhinitis. 42 g 3  . Indacaterol-Glycopyrrolate (UTIBRON NEOHALER) 27.5-15.6 MCG CAPS Place 1 Act into inhaler and inhale 2 (two) times daily. (Patient taking differently: Place 1 Act into inhaler and inhale 2 (two) times daily as needed (shortness of breath). ) 60 capsule 11  . lamoTRIgine (LAMICTAL) 200 MG tablet Take 200 mg by mouth at bedtime.    Marland Kitchen oxyCODONE-acetaminophen (PERCOCET/ROXICET) 5-325 MG tablet Take 1 tablet by mouth every 8 (eight) hours as needed for severe pain. 12 tablet 0  . potassium chloride SA (K-DUR,KLOR-CON) 20 MEQ tablet Take 1 tablet (20 mEq total) by mouth 3 (three) times daily. 90 tablet 3  . nebivolol (BYSTOLIC) 5 MG tablet Take 5 mg by mouth daily.    . predniSONE (DELTASONE) 10 MG tablet Take 2 tablets (20 mg total) by mouth daily. 15 tablet 0   No facility-administered medications prior to visit.     ROS Review of Systems  Constitutional: Negative.  Negative for chills, diaphoresis, fatigue and fever.    HENT: Negative.  Negative for sinus pressure, sore throat and trouble swallowing.   Respiratory: Negative.  Negative for cough, chest tightness, shortness of breath and wheezing.   Cardiovascular: Negative.  Negative for chest pain, palpitations and leg swelling.  Gastrointestinal: Negative.  Negative for abdominal pain, constipation, diarrhea, nausea, rectal pain and vomiting.  Endocrine: Negative.   Genitourinary: Negative.   Musculoskeletal: Negative.  Negative for arthralgias, back pain, myalgias and neck pain.  Skin: Negative.  Negative for color change and rash.  Neurological: Negative.  Negative for dizziness and light-headedness.  Hematological: Negative for adenopathy. Does not bruise/bleed easily.  Psychiatric/Behavioral: Negative.     Objective:  BP 126/82   Pulse 67   Temp 98.5 F (36.9 C) (Oral)   Resp 16   Wt 218 lb (98.9 kg)   LMP 05/14/1988   SpO2 98%   BMI 38.62 kg/m   BP Readings from Last 3 Encounters:  03/25/17 126/82  03/12/17 (!) 158/67  01/31/17 132/78    Wt Readings from Last 3 Encounters:  03/25/17 218 lb (98.9 kg)  01/31/17 220 lb (99.8 kg)  10/18/16 217 lb (98.4 kg)    Physical Exam  Constitutional: She is oriented to person, place, and time. No distress.  HENT:  Mouth/Throat: Oropharynx is clear and moist. No oropharyngeal exudate.  Eyes: Conjunctivae are normal. Right eye exhibits no discharge. Left eye exhibits no discharge. No scleral icterus.  Neck: Normal range of motion. Neck  supple. No JVD present. No thyromegaly present.  Cardiovascular: Normal rate, regular rhythm and intact distal pulses. Exam reveals no gallop and no friction rub.  No murmur heard. Pulmonary/Chest: Effort normal and breath sounds normal. No respiratory distress. She has no wheezes. She has no rales. She exhibits no tenderness.  Abdominal: Soft. Bowel sounds are normal. She exhibits no distension and no mass. There is no tenderness. There is no rebound and no  guarding.  Musculoskeletal: Normal range of motion. She exhibits no edema, tenderness or deformity.  Lymphadenopathy:    She has no cervical adenopathy.  Neurological: She is alert and oriented to person, place, and time.  Skin: Skin is warm and dry. No rash noted. She is not diaphoretic. No erythema. No pallor.  Vitals reviewed.   Lab Results  Component Value Date   WBC 9.8 03/12/2017   HGB 14.1 03/12/2017   HCT 42.6 03/12/2017   PLT 266 03/12/2017   GLUCOSE 98 03/25/2017   CHOL 125 01/31/2017   TRIG 129.0 01/31/2017   HDL 42.10 01/31/2017   LDLCALC 57 01/31/2017   ALT 15 09/18/2016   AST 14 09/18/2016   NA 141 03/25/2017   K 4.0 03/25/2017   CL 99 03/25/2017   CREATININE 0.78 03/25/2017   BUN 7 03/25/2017   CO2 34 (H) 03/25/2017   TSH 0.75 06/25/2016   HGBA1C 6.0 01/31/2017   MICROALBUR 10 01/31/2017    Dg Chest 2 View  Result Date: 03/12/2017 CLINICAL DATA:  Productive cough for 2 weeks. Chest pain with shortness of breath. Hypertension, diabetes. EXAM: CHEST  2 VIEW COMPARISON:  04/19/2016. FINDINGS: Mild cardiomegaly. Mild bronchitic change. No consolidation or edema. No effusion or pneumothorax. Bones unremarkable. IMPRESSION: Mild cardiomegaly.  No active cardiopulmonary disease. Electronically Signed   By: Staci Righter M.D.   On: 03/12/2017 14:34    Assessment & Plan:   April May was seen today for hypertension and copd.  Diagnoses and all orders for this visit:  HYPERTENSION, BENIGN ESSENTIAL- her blood pressure is adequately well controlled.  Will continue nebivolol at the current dose. -     nebivolol (BYSTOLIC) 5 MG tablet; Take 1 tablet (5 mg total) daily by mouth. -     Basic metabolic panel; Future -     Magnesium; Future  Diuretic-induced hypokalemia- her potassium level is normal now since she stopped taking hydrochlorothiazide.  Will continue the potassium supplement at the current dose. -     Basic metabolic panel; Future -     Magnesium;  Future  COPD with chronic bronchitis (HCC)-recent exacerbation has resolved.  I have asked her to quit smoking and to continue using the [place]/lama inhaler.   I have discontinued April May predniSONE. I have also changed her nebivolol. Additionally, I am having her maintain her lamoTRIgine, buPROPion, Indacaterol-Glycopyrrolate, oxyCODONE-acetaminophen, fluticasone, clonazePAM, and potassium chloride SA.  Meds ordered this encounter  Medications  . nebivolol (BYSTOLIC) 5 MG tablet    Sig: Take 1 tablet (5 mg total) daily by mouth.    Dispense:  90 tablet    Refill:  1     Follow-up: Return in about 3 months (around 06/25/2017).  April Calico, MD

## 2017-03-25 NOTE — Patient Instructions (Signed)

## 2017-03-29 ENCOUNTER — Encounter: Payer: Self-pay | Admitting: Internal Medicine

## 2017-04-01 ENCOUNTER — Other Ambulatory Visit: Payer: Self-pay | Admitting: Internal Medicine

## 2017-04-03 ENCOUNTER — Other Ambulatory Visit: Payer: Self-pay | Admitting: Internal Medicine

## 2017-04-03 DIAGNOSIS — E876 Hypokalemia: Secondary | ICD-10-CM

## 2017-04-03 DIAGNOSIS — I10 Essential (primary) hypertension: Secondary | ICD-10-CM

## 2017-04-16 NOTE — Telephone Encounter (Signed)
erroe

## 2017-04-20 ENCOUNTER — Encounter: Payer: Self-pay | Admitting: Internal Medicine

## 2017-04-20 ENCOUNTER — Other Ambulatory Visit: Payer: Self-pay | Admitting: Internal Medicine

## 2017-04-20 DIAGNOSIS — J302 Other seasonal allergic rhinitis: Secondary | ICD-10-CM

## 2017-04-22 ENCOUNTER — Encounter: Payer: Self-pay | Admitting: Internal Medicine

## 2017-04-23 ENCOUNTER — Other Ambulatory Visit: Payer: Self-pay | Admitting: Internal Medicine

## 2017-04-23 DIAGNOSIS — T502X5A Adverse effect of carbonic-anhydrase inhibitors, benzothiadiazides and other diuretics, initial encounter: Secondary | ICD-10-CM

## 2017-04-23 DIAGNOSIS — E876 Hypokalemia: Secondary | ICD-10-CM

## 2017-04-23 DIAGNOSIS — I10 Essential (primary) hypertension: Secondary | ICD-10-CM

## 2017-04-23 MED ORDER — SPIRONOLACTONE 25 MG PO TABS: 25.0000 mg | ORAL_TABLET | Freq: Every day | ORAL | 0 refills | Status: DC

## 2017-04-26 ENCOUNTER — Ambulatory Visit (INDEPENDENT_AMBULATORY_CARE_PROVIDER_SITE_OTHER): Payer: Medicare Other | Admitting: Internal Medicine

## 2017-04-26 ENCOUNTER — Encounter: Payer: Self-pay | Admitting: Internal Medicine

## 2017-04-26 ENCOUNTER — Ambulatory Visit (INDEPENDENT_AMBULATORY_CARE_PROVIDER_SITE_OTHER)
Admission: RE | Admit: 2017-04-26 | Discharge: 2017-04-26 | Disposition: A | Discharge status: Home / Self Care | Payer: Medicare Other | Source: Ambulatory Visit | Attending: Internal Medicine | Admitting: Internal Medicine

## 2017-04-26 VITALS — BP 144/94 | HR 55 | Temp 98.7°F | Ht 63.0 in | Wt 221.0 lb

## 2017-04-26 DIAGNOSIS — M542 Cervicalgia: Secondary | ICD-10-CM | POA: Diagnosis not present

## 2017-04-26 DIAGNOSIS — M5030 Other cervical disc degeneration, unspecified cervical region: Secondary | ICD-10-CM

## 2017-04-26 DIAGNOSIS — I10 Essential (primary) hypertension: Secondary | ICD-10-CM

## 2017-04-26 MED ORDER — OXYCODONE-ACETAMINOPHEN 5-325 MG PO TABS: 1.0000 | ORAL_TABLET | Freq: Three times a day (TID) | ORAL | 0 refills | Status: DC | PRN

## 2017-04-26 NOTE — Patient Instructions (Signed)
Acute Torticollis, Adult Torticollis is a condition in which the muscles of the neck tighten (contract) abnormally, causing the neck to twist and the head to move into an unnatural position. Torticollis that develops suddenly is called acute torticollis. People with acute torticollis may have trouble turning their head. The condition can be painful and may range from mild to severe. What are the causes? This condition may be caused by:  Sleeping in an awkward position (common).  Extending or twisting the neck muscles beyond their normal position.  An injury to the neck muscles.  An infection.  A tumor.  Certain medicines.  Long-lasting spasms of the neck muscles.  In some cases, the cause may not be known. What increases the risk? You are more likely to develop this condition if:  You have a condition associated with loose ligaments, such as Down syndrome.  You have a brain condition that affects vision, such as strabismus.  What are the signs or symptoms? The main symptom of this condition is tilting of the head to one side. Other symptoms include:  Pain in the neck.  Trouble turning the head from side to side or up and down.  How is this diagnosed? This condition may be diagnosed based on:  A physical exam.  Your medical history.  Imaging tests, such as: ? An X-ray. ? An ultrasound. ? A CT scan. ? An MRI.  How is this treated? Treatment for this condition depends on what is causing the condition. Mild cases may go away without treatment. Treatment for more serious cases may include:  Medicines or shots to relax the muscles.  Other medicines, such as antibiotics to treat the underlying cause.  Wearing a soft neck collar.  Physical therapy and stretching to improve neck strength and flexibility.  Neck massage.  In severe cases, surgery may be needed to repair dislocated or broken bones or to treat nerves in the neck. Follow these instructions at  home:  Take over-the-counter and prescription medicines only as told by your health care provider.  Do stretching exercises and massage your neck as told by your health care provider.  If directed, apply heat to the affected area as often as told by your health care provider. Use the heat source that your health care provider recommends, such as a moist heat pack or a heating pad. ? Place a towel between your skin and the heat source. ? Leave the heat on for 20-30 minutes. ? Remove the heat if your skin turns bright red. This is especially important if you are unable to feel pain, heat, or cold. You may have a greater risk of getting burned.  If you wake up with torticollis after sleeping, check your bed or sleeping area. Look for lumpy pillows or unusual objects. Make sure your bed and sleeping area are comfortable.  Keep all follow-up visits as told by your health care provider. This is important. Contact a health care provider if:  You have a fever.  Your symptoms do not improve or they get worse. Get help right away if:  You have trouble breathing.  You develop noisy breathing (stridor).  You start to drool.  You have trouble swallowing or pain when swallowing.  You develop numbness or weakness in your hands or feet.  You have changes in your speech, understanding, or vision.  You are in severe pain.  You cannot move your head or neck. Summary  Torticollis is a condition in which the muscles of the neck   tighten (contract) abnormally, causing the neck to twist and the head to move into an unnatural position. Torticollis that develops suddenly is called acute torticollis.  Treatment for this condition depends on what is causing the condition. Mild cases may go away without treatment.  Do stretching exercises and massage your neck as told by your health care provider. You may also be instructed to apply heat to the area.  Contact your health care provider if your symptoms  do not improve or they get worse. This information is not intended to replace advice given to you by your health care provider. Make sure you discuss any questions you have with your health care provider. Document Released: 04/27/2000 Document Revised: 06/28/2016 Document Reviewed: 06/28/2016 Elsevier Interactive Patient Education  2018 [employer]vier [employer]  

## 2017-04-26 NOTE — Progress Notes (Signed)
Subjective:  Patient ID: JOCI DRESS, female    DOB: 05-17-1955  Age: 61 y.o. MRN: [id number]  CC: Hypertension   HPI April May presents for a BP check - she recently called the office and complained that her blood pressure was not well controlled.  She also has a history of hypokalemia.  I decided to add spironolactone to her regimen.  Fortunately, she iha responded well to this and tells me that her blood pressure is improving.  She also complains of chronic, recurrent episodes of posterior neck pain.  The most recent episode started about 5 days ago.  She has tried to control the pain with Tylenol but has not gotten any symptom relief.  She is requesting a refill on Percocet for pain control.  The pain radiates to the sides of the neck but not towards her shoulders or arms.  She denies numbness, weakness, tingling in her arms or legs.  Outpatient Medications Prior to Visit  Medication Sig Dispense Refill  . buPROPion (WELLBUTRIN XL) 150 MG 24 hr tablet TAKE 1 TABLET BY MOUTH ONCE A DAY IN EARLY AM  2  . fluticasone (FLONASE) 50 MCG/ACT nasal spray USE 2 SPRAYS INTO BOTH  NOSTRILS DAILY AS NEEDED  FOR ALLERGIES OR RHINITIS. 32 g 3  . Indacaterol-Glycopyrrolate (UTIBRON NEOHALER) 27.5-15.6 MCG CAPS Place 1 Act into inhaler and inhale 2 (two) times daily. (Patient taking differently: Place 1 Act into inhaler and inhale 2 (two) times daily as needed (shortness of breath). ) 60 capsule 11  . lamoTRIgine (LAMICTAL) 200 MG tablet Take 200 mg by mouth at bedtime.    . nebivolol (BYSTOLIC) 5 MG tablet Take 1 tablet (5 mg total) daily by mouth. 90 tablet 1  . potassium chloride SA (K-DUR,KLOR-CON) 20 MEQ tablet Take 1 tablet (20 mEq total) by mouth 3 (three) times daily. 90 tablet 3  . spironolactone (ALDACTONE) 25 MG tablet Take 1 tablet (25 mg total) by mouth daily. 30 tablet 0  . clonazePAM (KLONOPIN) 0.5 MG tablet Take 1 tablet (0.5 mg total) by mouth 2 (two) times daily as  needed for anxiety. 180 tablet 0  . oxyCODONE-acetaminophen (PERCOCET/ROXICET) 5-325 MG tablet Take 1 tablet by mouth every 8 (eight) hours as needed for severe pain. 12 tablet 0  . potassium chloride SA (K-DUR,KLOR-CON) 20 MEQ tablet Take 1 tablet (20 mEq total) by mouth 3 (three) times daily. 90 tablet 3   No facility-administered medications prior to visit.     ROS Review of Systems  Constitutional: Negative for fatigue.  HENT: Negative.  Negative for trouble swallowing.   Eyes: Negative for photophobia and visual disturbance.  Respiratory: Negative for cough, chest tightness, shortness of breath and wheezing.   Cardiovascular: Negative.  Negative for chest pain, palpitations and leg swelling.  Gastrointestinal: Negative.  Negative for abdominal pain, nausea and vomiting.  Endocrine: Negative.   Genitourinary: Negative.  Negative for difficulty urinating.  Musculoskeletal: Positive for neck pain. Negative for arthralgias and back pain.  Skin: Negative.   Allergic/Immunologic: Negative.   Neurological: Negative for dizziness, tremors, weakness, light-headedness, numbness and headaches.  Hematological: Negative for adenopathy. Does not bruise/bleed easily.  Psychiatric/Behavioral: Negative.     Objective:  BP (!) 144/94 (BP Location: Left Arm, Patient Position: Sitting, Cuff Size: Large)   Pulse (!) 55   Temp 98.7 F (37.1 C) (Oral)   Ht 5\' 3"  (1.6 m)   Wt 221 lb (100.2 kg)   LMP 05/14/1988   SpO2 98%  BMI 39.15 kg/m   BP Readings from Last 3 Encounters:  04/26/17 (!) 144/94  03/25/17 126/82  03/12/17 (!) 158/67    Wt Readings from Last 3 Encounters:  04/26/17 221 lb (100.2 kg)  03/25/17 218 lb (98.9 kg)  01/31/17 220 lb (99.8 kg)    Physical Exam  Constitutional: She is oriented to person, place, and time. No distress.  HENT:  Mouth/Throat: Oropharynx is clear and moist. No oropharyngeal exudate.  Eyes: Conjunctivae are normal. Left eye exhibits no discharge.  No scleral icterus.  Neck: Trachea normal and normal range of motion. Neck supple. No tracheal tenderness present. No tracheal deviation, no edema and no erythema present. No thyromegaly present.  Cardiovascular: Normal rate, regular rhythm and normal heart sounds.  No murmur heard. Pulmonary/Chest: Effort normal and breath sounds normal. No stridor. No respiratory distress. She has no wheezes. She has no rales.  Abdominal: Soft. Bowel sounds are normal. She exhibits no mass. There is no tenderness.  Musculoskeletal: Normal range of motion. She exhibits no edema or tenderness.       Cervical back: Normal. She exhibits normal range of motion, no tenderness, no bony tenderness, no swelling, no edema, no deformity, no pain and no spasm.  Lymphadenopathy:    She has no cervical adenopathy.  Neurological: She is alert and oriented to person, place, and time. She has normal strength and normal reflexes. She displays no atrophy and normal reflexes. No sensory deficit. She exhibits normal muscle tone. She displays a negative Romberg sign. Coordination and gait normal.  Skin: Skin is warm and dry. No rash noted. She is not diaphoretic. No erythema. No pallor.  Vitals reviewed.   Lab Results  Component Value Date   WBC 9.8 03/12/2017   HGB 14.1 03/12/2017   HCT 42.6 03/12/2017   PLT 266 03/12/2017   GLUCOSE 98 03/25/2017   CHOL 125 01/31/2017   TRIG 129.0 01/31/2017   HDL 42.10 01/31/2017   LDLCALC 57 01/31/2017   ALT 15 09/18/2016   AST 14 09/18/2016   NA 141 03/25/2017   K 4.0 03/25/2017   CL 99 03/25/2017   CREATININE 0.78 03/25/2017   BUN 7 03/25/2017   CO2 34 (H) 03/25/2017   TSH 0.75 06/25/2016   HGBA1C 6.0 01/31/2017   MICROALBUR 10 01/31/2017    Dg Chest 2 View  Result Date: 03/12/2017 CLINICAL DATA:  Productive cough for 2 weeks. Chest pain with shortness of breath. Hypertension, diabetes. EXAM: CHEST  2 VIEW COMPARISON:  04/19/2016. FINDINGS: Mild cardiomegaly. Mild  bronchitic change. No consolidation or edema. No effusion or pneumothorax. Bones unremarkable. IMPRESSION: Mild cardiomegaly.  No active cardiopulmonary disease. Electronically Signed   By: Staci Righter M.D.   On: 03/12/2017 14:34    Assessment & Plan:   April May was seen today for hypertension.  Diagnoses and all orders for this visit:  Neck pain, bilateral- The examination is unremarkable and she is neurologically intact.  Plain films are positive for DDD.  Will offer Percocet as needed for pain control. -     DG Cervical Spine Complete; Future -     oxyCODONE-acetaminophen (PERCOCET/ROXICET) 5-325 MG tablet; Take 1 tablet by mouth every 8 (eight) hours as needed for severe pain.  HYPERTENSION, BENIGN ESSENTIAL- Her blood pressure is not adequately well controlled but she has only been on spironolactone for 2 days.  Will continue the current regimen of Bystolic and spironolactone and I anticipate her blood pressure will normalize soon.  DDD (degenerative disc disease),  cervical- as above   I have discontinued April May's clonazePAM. I am also having her maintain her lamoTRIgine, buPROPion, Indacaterol-Glycopyrrolate, nebivolol, potassium chloride SA, fluticasone, spironolactone, and oxyCODONE-acetaminophen.  Meds ordered this encounter  Medications  . oxyCODONE-acetaminophen (PERCOCET/ROXICET) 5-325 MG tablet    Sig: Take 1 tablet by mouth every 8 (eight) hours as needed for severe pain.    Dispense:  75 tablet    Refill:  0     Follow-up: Return in about 2 months (around 06/27/2017).  April Calico, MD

## 2017-04-27 ENCOUNTER — Encounter: Payer: Self-pay | Admitting: Internal Medicine

## 2017-04-27 DIAGNOSIS — M5030 Other cervical disc degeneration, unspecified cervical region: Secondary | ICD-10-CM | POA: Insufficient documentation

## 2017-05-08 ENCOUNTER — Encounter: Payer: Self-pay | Admitting: Internal Medicine

## 2017-05-09 ENCOUNTER — Other Ambulatory Visit: Payer: Self-pay | Admitting: Internal Medicine

## 2017-05-09 ENCOUNTER — Telehealth: Payer: Self-pay | Admitting: Internal Medicine

## 2017-05-09 DIAGNOSIS — I10 Essential (primary) hypertension: Secondary | ICD-10-CM

## 2017-05-09 MED ORDER — TELMISARTAN 40 MG PO TABS: 40.0000 mg | ORAL_TABLET | Freq: Every day | ORAL | 0 refills | Status: DC

## 2017-05-09 NOTE — Telephone Encounter (Signed)
Pt. Called and is concerned about taking her new medication,Micardis, with the amount of potassium she is taking. She read in the side effects she should decrease her potassium intake. She is asking for the doctor's opinion on this - does she need to decrease her potassium pills.

## 2017-05-10 ENCOUNTER — Telehealth: Payer: Self-pay | Admitting: Internal Medicine

## 2017-05-10 NOTE — Telephone Encounter (Signed)
Called in with concerns regarding her medications and potassium level being affected. Dr. Ronnald Ramp ordered Micardis for her hypertension.  She is also on Aldactone.  Her question is:  She is taking potassium 20 meq 3 times/day.   The instructions with the Micardis RX (which she has not picked up yet because of her concerns listed here.   She looked up the information on the Internet) says she should not take Micardis with Aldactone.     What is Dr. Ronnald Ramp recommendation in regards to potassium tablets?  Decrease dose?  Also what does he recommend in regards to not taking the Micardis with the Aldactone?    Can he just increase the dose of Aldactone instead of adding another medication (Micardis)?  She has Crohn's so she has 4-5 loose stools a day which is her normal.   She knows she loses some potassium this way also.  She is active on MyChart.   She is requesting a follow up response via MyChart or a phone call.   Thanks.  I routed a note to Dr. Ronnald Ramp' nurse pool.

## 2017-05-10 NOTE — Telephone Encounter (Signed)
Copied from [place]. Topic: Quick Communication - See Telephone Encounter >> May 10, 2017 12:55 PM Hewitt Shorts wrote: CRM for notification. See Telephone encounter for: pt called back to day about taking her potassium and the new med micardis she states that she left a message yesterday and no one called her and would like a call back to day   Best number [phone]  05/10/17.

## 2017-05-13 NOTE — Telephone Encounter (Signed)
Pt informed of PCP response. Pt will start new medications and make an appt for follow up of BP afterward.

## 2017-05-13 NOTE — Telephone Encounter (Signed)
Pt is concerned about a reaction with aldactone and micardis.

## 2017-05-13 NOTE — Telephone Encounter (Signed)
I am not concerned about this I think she will be ok

## 2017-05-20 ENCOUNTER — Other Ambulatory Visit: Payer: Self-pay | Admitting: Internal Medicine

## 2017-05-20 DIAGNOSIS — E876 Hypokalemia: Secondary | ICD-10-CM

## 2017-05-20 DIAGNOSIS — I10 Essential (primary) hypertension: Secondary | ICD-10-CM

## 2017-05-20 DIAGNOSIS — T502X5A Adverse effect of carbonic-anhydrase inhibitors, benzothiadiazides and other diuretics, initial encounter: Secondary | ICD-10-CM

## 2017-05-23 ENCOUNTER — Telehealth: Payer: Self-pay | Admitting: Internal Medicine

## 2017-05-23 DIAGNOSIS — I10 Essential (primary) hypertension: Secondary | ICD-10-CM

## 2017-05-23 DIAGNOSIS — E876 Hypokalemia: Secondary | ICD-10-CM

## 2017-05-23 DIAGNOSIS — T502X5A Adverse effect of carbonic-anhydrase inhibitors, benzothiadiazides and other diuretics, initial encounter: Secondary | ICD-10-CM

## 2017-05-23 MED ORDER — SPIRONOLACTONE 25 MG PO TABS: 25.0000 mg | ORAL_TABLET | Freq: Every day | ORAL | 0 refills | Status: DC

## 2017-05-23 NOTE — Telephone Encounter (Signed)
Copied from [place] [id number]. Topic: Quick Communication - Rx Refill/Question >> May 23, 2017  9:02 AM Lolita Rieger, RMA wrote: Medication: spironalactone 25 mg   Has the patient contacted their pharmacy? yes   (Agent: If no, request that the patient contact the pharmacy for the refill.)   Preferred Pharmacy (with phone number or street name): [facility]    Agent: Please be advised that RX refills may take up to 3 business days. We ask that you follow-up with your pharmacy.

## 2017-05-23 NOTE — Telephone Encounter (Signed)
Resent to [facility]...April May

## 2017-05-29 ENCOUNTER — Other Ambulatory Visit (INDEPENDENT_AMBULATORY_CARE_PROVIDER_SITE_OTHER): Payer: Medicare Other

## 2017-05-29 ENCOUNTER — Other Ambulatory Visit: Payer: Self-pay | Admitting: Internal Medicine

## 2017-05-29 ENCOUNTER — Encounter: Payer: Self-pay | Admitting: Internal Medicine

## 2017-05-29 ENCOUNTER — Ambulatory Visit (INDEPENDENT_AMBULATORY_CARE_PROVIDER_SITE_OTHER): Payer: Medicare Other | Admitting: Internal Medicine

## 2017-05-29 VITALS — BP 160/110 | HR 61 | Temp 98.1°F | Resp 16 | Ht 63.0 in | Wt 223.0 lb

## 2017-05-29 DIAGNOSIS — I10 Essential (primary) hypertension: Secondary | ICD-10-CM

## 2017-05-29 DIAGNOSIS — T502X5A Adverse effect of carbonic-anhydrase inhibitors, benzothiadiazides and other diuretics, initial encounter: Secondary | ICD-10-CM

## 2017-05-29 DIAGNOSIS — G4733 Obstructive sleep apnea (adult) (pediatric): Secondary | ICD-10-CM

## 2017-05-29 DIAGNOSIS — E876 Hypokalemia: Secondary | ICD-10-CM | POA: Diagnosis not present

## 2017-05-29 LAB — BASIC METABOLIC PANEL
BUN: 10 mg/dL (ref 6–23)
CALCIUM: 9.3 mg/dL (ref 8.4–10.5)
CO2: 33 mEq/L — ABNORMAL HIGH (ref 19–32)
CREATININE: 0.83 mg/dL (ref 0.40–1.20)
Chloride: 103 mEq/L (ref 96–112)
GFR: 89.71 mL/min (ref >60.00)
Glucose, Bld: 101 mg/dL — ABNORMAL HIGH (ref 70–99)
Potassium: 3.4 mEq/L — ABNORMAL LOW (ref 3.5–5.1)
Sodium: 143 mEq/L (ref 135–145)

## 2017-05-29 LAB — CORTISOL: CORTISOL PLASMA: 7.3 ug/dL

## 2017-05-29 MED ORDER — TRIAMTERENE-HCTZ 37.5-25 MG PO CAPS: 1.0000 | ORAL_CAPSULE | Freq: Every day | ORAL | 0 refills | Status: DC

## 2017-05-29 MED ORDER — TELMISARTAN 40 MG PO TABS: 40.0000 mg | ORAL_TABLET | Freq: Every day | ORAL | 0 refills | Status: DC

## 2017-05-29 NOTE — Progress Notes (Signed)
Subjective:  Patient ID: April May, female    DOB: 07/22/1955  Age: 62 y.o. MRN: [id number]  CC: Hypertension (Follow up ); Medication Problem (Pt states that during last visit Dr changed Rx for BP however Pt states that there's been no change in BP and also states that it seems to be going up still ); and Follow-up   HPI April May presents for a BP check - she complains that her blood pressure is too high.  When I last saw her I changed her to Spironolactone due to hypertension and hypokalemia.  She tells me it is not working for her.  She has had a few episodes of headache and dizziness.  She denies nausea, vomiting, blurred vision, chest pain, shortness of breath, or edema.  Outpatient Medications Prior to Visit  Medication Sig Dispense Refill  . buPROPion (WELLBUTRIN XL) 150 MG 24 hr tablet TAKE 1 TABLET BY MOUTH ONCE A DAY IN EARLY AM  2  . fluticasone (FLONASE) 50 MCG/ACT nasal spray USE 2 SPRAYS INTO BOTH  NOSTRILS DAILY AS NEEDED  FOR ALLERGIES OR RHINITIS. 32 g 3  . Indacaterol-Glycopyrrolate (UTIBRON NEOHALER) 27.5-15.6 MCG CAPS Place 1 Act into inhaler and inhale 2 (two) times daily. (Patient taking differently: Place 1 Act into inhaler and inhale 2 (two) times daily as needed (shortness of breath). ) 60 capsule 11  . lamoTRIgine (LAMICTAL) 200 MG tablet Take 200 mg by mouth at bedtime.    . nebivolol (BYSTOLIC) 5 MG tablet Take 1 tablet (5 mg total) daily by mouth. 90 tablet 1  . oxyCODONE-acetaminophen (PERCOCET/ROXICET) 5-325 MG tablet Take 1 tablet by mouth every 8 (eight) hours as needed for severe pain. 75 tablet 0  . potassium chloride SA (K-DUR,KLOR-CON) 20 MEQ tablet Take 1 tablet (20 mEq total) by mouth 3 (three) times daily. 90 tablet 3  . spironolactone (ALDACTONE) 25 MG tablet Take 1 tablet (25 mg total) by mouth daily. 90 tablet 0  . telmisartan (MICARDIS) 40 MG tablet Take 1 tablet (40 mg total) by mouth daily. 90 tablet 0   No  facility-administered medications prior to visit.     ROS Review of Systems  Constitutional: Negative for appetite change, diaphoresis, fatigue and unexpected weight change.  HENT: Negative.   Eyes: Negative for visual disturbance.  Respiratory: Positive for apnea. Negative for cough, chest tightness and shortness of breath.        She tells me that people in a different room complain about her snoring.  Cardiovascular: Negative for chest pain, palpitations and leg swelling.  Gastrointestinal: Negative for abdominal pain, diarrhea, nausea and vomiting.  Endocrine: Negative.   Genitourinary: Negative.  Negative for difficulty urinating, hematuria and urgency.  Musculoskeletal: Negative for arthralgias and myalgias.  Neurological: Positive for dizziness and headaches. Negative for weakness and numbness.  Hematological: Negative for adenopathy. Does not bruise/bleed easily.  Psychiatric/Behavioral: Negative.     Objective:  BP (!) 160/110 Comment: BP (R) 164/102 (L) 162/102  Pulse 61   Temp 98.1 F (36.7 C) (Oral)   Resp 16   Ht 5\' 3"  (1.6 m)   Wt 223 lb (101.2 kg)   LMP 05/14/1988   SpO2 97%   BMI 39.50 kg/m   BP Readings from Last 3 Encounters:  05/31/17 (!) 182/84  05/29/17 (!) 160/110  04/26/17 (!) 144/94    Wt Readings from Last 3 Encounters:  05/29/17 223 lb (101.2 kg)  04/26/17 221 lb (100.2 kg)  03/25/17 218 lb (98.9  kg)    Physical Exam  Constitutional: She is oriented to person, place, and time. No distress.  HENT:  Mouth/Throat: Oropharynx is clear and moist. No oropharyngeal exudate.  Eyes: Conjunctivae are normal. Left eye exhibits no discharge.  Neck: Normal range of motion. Neck supple. No JVD present.  Cardiovascular: Normal rate, regular rhythm and normal heart sounds. Exam reveals no gallop.  No murmur heard. Pulmonary/Chest: Effort normal and breath sounds normal. She has no wheezes. She has no rales.  Abdominal: Soft. Bowel sounds are normal.  There is no tenderness.  Musculoskeletal: Normal range of motion. She exhibits no edema or tenderness.  Neurological: She is alert and oriented to person, place, and time.  Skin: Skin is warm and dry. No rash noted. She is not diaphoretic. No erythema. No pallor.  Vitals reviewed.   Lab Results  Component Value Date   WBC 9.8 03/12/2017   HGB 14.1 03/12/2017   HCT 42.6 03/12/2017   PLT 266 03/12/2017   GLUCOSE 101 (H) 05/29/2017   CHOL 125 01/31/2017   TRIG 129.0 01/31/2017   HDL 42.10 01/31/2017   LDLCALC 57 01/31/2017   ALT 15 09/18/2016   AST 14 09/18/2016   NA 143 05/29/2017   K 3.4 (L) 05/29/2017   CL 103 05/29/2017   CREATININE 0.83 05/29/2017   BUN 10 05/29/2017   CO2 33 (H) 05/29/2017   TSH 0.75 06/25/2016   HGBA1C 6.0 01/31/2017   MICROALBUR 10 01/31/2017    Dg Cervical Spine Complete  Result Date: 04/26/2017 CLINICAL DATA:  Neck pain.  No known injury. EXAM: CERVICAL SPINE - COMPLETE 4+ VIEW COMPARISON:  01/22/2013. FINDINGS: All bile diffuse degenerative change. No acute bony or joint abnormality identified. Neuroforamen are patent. Pulmonary apices are clear. IMPRESSION: No acute abnormality.  Mild diffuse degenerative change. Electronically Signed   By: Marcello Moores  Register   On: 04/26/2017 15:55    Assessment & Plan:   April May was seen today for hypertension, medication problem and follow-up.  Diagnoses and all orders for this visit:  HYPERTENSION, BENIGN ESSENTIAL- Her blood pressure is not adequately well controlled on spironolactone.  I have asked her to upgrade to the combination of triamterene and hydrochlorothiazide. -     Discontinue: triamterene-hydrochlorothiazide (DYAZIDE) 37.5-25 MG capsule; Take 1 each (1 capsule total) by mouth daily. -     Basic metabolic panel; Future -     Cortisol; Future  Diuretic-induced hypokalemia- She is not doing well on spironolactone so will start triamterene to help stabilize her potassium level. -     Discontinue:  triamterene-hydrochlorothiazide (DYAZIDE) 37.5-25 MG capsule; Take 1 each (1 capsule total) by mouth daily. -     Basic metabolic panel; Future -     Cortisol; Future  Obstructive sleep apnea syndrome- This may be the reason her blood pressure is difficult to control.  I have asked her to be seen by sleep medicine to consider having this treated. -     Ambulatory referral to Sleep Studies   I have discontinued April May's spironolactone. I am also having her maintain her lamoTRIgine, buPROPion, Indacaterol-Glycopyrrolate, nebivolol, potassium chloride SA, fluticasone, and oxyCODONE-acetaminophen.  Meds ordered this encounter  Medications  . DISCONTD: triamterene-hydrochlorothiazide (DYAZIDE) 37.5-25 MG capsule    Sig: Take 1 each (1 capsule total) by mouth daily.    Dispense:  90 capsule    Refill:  0     Follow-up: Return in about 3 months (around 08/27/2017).  Scarlette Calico, MD

## 2017-05-29 NOTE — Patient Instructions (Signed)

## 2017-05-29 NOTE — Telephone Encounter (Signed)
Resent script to [facility].Marland KitchenJohny Chess

## 2017-05-30 ENCOUNTER — Other Ambulatory Visit: Payer: Self-pay

## 2017-05-30 ENCOUNTER — Emergency Department (HOSPITAL_COMMUNITY)
Admission: EM | Admit: 2017-05-30 | Discharge: 2017-05-31 | Disposition: A | Discharge status: Home / Self Care | Payer: Medicare Other | Attending: Emergency Medicine | Admitting: Emergency Medicine

## 2017-05-30 ENCOUNTER — Encounter (HOSPITAL_COMMUNITY): Payer: Self-pay

## 2017-05-30 ENCOUNTER — Emergency Department (HOSPITAL_COMMUNITY): Payer: Medicare Other

## 2017-05-30 DIAGNOSIS — Z79899 Other long term (current) drug therapy: Secondary | ICD-10-CM | POA: Diagnosis not present

## 2017-05-30 DIAGNOSIS — E119 Type 2 diabetes mellitus without complications: Secondary | ICD-10-CM | POA: Diagnosis not present

## 2017-05-30 DIAGNOSIS — J449 Chronic obstructive pulmonary disease, unspecified: Secondary | ICD-10-CM | POA: Insufficient documentation

## 2017-05-30 DIAGNOSIS — I158 Other secondary hypertension: Secondary | ICD-10-CM | POA: Diagnosis not present

## 2017-05-30 DIAGNOSIS — R51 Headache: Secondary | ICD-10-CM | POA: Insufficient documentation

## 2017-05-30 DIAGNOSIS — F17210 Nicotine dependence, cigarettes, uncomplicated: Secondary | ICD-10-CM | POA: Insufficient documentation

## 2017-05-30 DIAGNOSIS — R110 Nausea: Secondary | ICD-10-CM | POA: Diagnosis not present

## 2017-05-30 DIAGNOSIS — I10 Essential (primary) hypertension: Secondary | ICD-10-CM | POA: Insufficient documentation

## 2017-05-30 DIAGNOSIS — I159 Secondary hypertension, unspecified: Secondary | ICD-10-CM | POA: Diagnosis not present

## 2017-05-30 DIAGNOSIS — R519 Headache, unspecified: Secondary | ICD-10-CM

## 2017-05-30 MED ORDER — KETOROLAC TROMETHAMINE 30 MG/ML IJ SOLN
30.0000 mg | Freq: Once | INTRAMUSCULAR | Status: AC
  Administered 2017-05-30: 30 mg via INTRAMUSCULAR
  Filled 2017-05-30: qty 1

## 2017-05-30 MED ORDER — DIPHENHYDRAMINE HCL 25 MG PO CAPS
25.0000 mg | ORAL_CAPSULE | Freq: Once | ORAL | Status: AC
  Administered 2017-05-30: 25 mg via ORAL
  Filled 2017-05-30: qty 1

## 2017-05-30 MED ORDER — METOCLOPRAMIDE HCL 10 MG PO TABS
5.0000 mg | ORAL_TABLET | Freq: Once | ORAL | Status: AC
Start: 2017-05-30 — End: 2017-05-30
  Administered 2017-05-30: 5 mg via ORAL
  Filled 2017-05-30: qty 1

## 2017-05-30 NOTE — ED Triage Notes (Signed)
Pt states she has had headaches for 2 weeks. She has had elevated BP and recently started on 3 medications. She reports blurred vision and dizziness as well. Pt AOX4.

## 2017-05-30 NOTE — ED Provider Notes (Signed)
Patient placed in Quick Look pathway, seen and evaluated   Chief Complaint: headache   HPI: 62 year old female presents today with complaints of headache.  She has 2 weeks of intermittent headache that have returned persistent.  She notes this is in the front and wrapping around the right eye.  She denies any acute neurological deficits.  Patient denies a significant past medical history of headaches.  She does note that recently she has had problems with her blood pressure and is on triple therapy at this time.  She notes she took her blood pressure medication this morning and is still hypertensive.  She saw her primary care yesterday with her basic laboratory analysis.  Patient notes that she is taking oxycodone for the headaches which is not improving her symptoms.   ROS: Neuro: No weakness (one); constitutional no fever  Physical Exam:   Gen: No distress  Neuro: Awake and Alert  Skin: Warm    Focused Exam: Neuro: Cranial nerves intact, musculoskeletal: Neck supple full active range of motion bilateral upper extremity sensation strength and motor function intact   Initiation of care has begun. The patient has been counseled on the process, plan, and necessity for staying for the completion/evaluation, and the remainder of the medical screening examination   Francee Gentile 05/30/17 1836    Noemi Chapel, MD 05/31/17 1459

## 2017-05-31 MED ORDER — ACETAMINOPHEN 500 MG PO TABS: 1000.0000 mg | ORAL_TABLET | Freq: Three times a day (TID) | ORAL | 0 refills | Status: AC

## 2017-05-31 MED ORDER — ACETAMINOPHEN 500 MG PO TABS
1000.0000 mg | ORAL_TABLET | Freq: Once | ORAL | Status: AC
  Administered 2017-05-31: 1000 mg via ORAL
  Filled 2017-05-31: qty 2

## 2017-05-31 NOTE — ED Notes (Signed)
Patient refused to have labs drawn; Provider aware.

## 2017-05-31 NOTE — ED Provider Notes (Addendum)
[facility] EMERGENCY DEPARTMENT Provider Note  CSN: [id number] Arrival date & time: 05/30/17 1755  Chief Complaint(s) Headache  HPI April May is a 62 y.o. female    Headache   This is a new problem. Episode onset: 3 days. The problem occurs constantly. The problem has not changed since onset.Associated with: HTN. Pain location: calvarial. The quality of the pain is described as dull and throbbing. The pain is moderate. Associated symptoms include nausea. Pertinent negatives include no fever, no malaise/fatigue, no chest pressure, no palpitations, no shortness of breath and no vomiting. Treatments tried: Percocet. The treatment provided no relief.   Patient has a history of hypertension and recently had a change in her medications.  She reports that she was just started on Dyazide and took her first dose yesterday.   Past Medical History Past Medical History:  Diagnosis Date  . Allergy   . Crohn disease ([place])   . Depression   . Diabetes mellitus type II    borderline, md checks hemaglobin A 1 c at md office   . Hypertension   . Sleep apnea    does not use cpap since weight loss 1 and 1/2 years ago   Patient Active Problem List   Diagnosis Date Noted  . DDD (degenerative disc disease), cervical 04/27/2017  . GAD (generalized anxiety disorder) 12/03/2016  . Aortic ejection murmur 03/25/2016  . Crohn's disease of both small and large intestine with fistula ([place]) 11/24/2014  . Crohn's regional enteritis ([place]) 05/20/2014  . COPD with chronic bronchitis ([place]) 03/11/2014  . Diuretic-induced hypokalemia 01/22/2013  . Neck pain, bilateral 01/22/2013  . Hyperlipidemia LDL goal <130 06/13/2011  . Sleep apnea 04/27/2011  . Prediabetes 03/01/2011  . Obesity, Class II, BMI 35-39.9, with comorbidity 04/01/2009  . TOBACCO ABUSE 02/24/2008  . ALLERGIC RHINITIS 02/24/2008  . DEPRESSION 08/26/2007  . HYPERTENSION, BENIGN ESSENTIAL 08/04/2007   Home  Medication(s) Prior to Admission medications   Medication Sig Start Date End Date Taking? Authorizing Provider  acetaminophen (TYLENOL) 500 MG tablet Take 2 tablets (1,000 mg total) by mouth every 8 (eight) hours for 5 days. Do not take more than 4000 mg of acetaminophen (Tylenol) in a 24-hour period. Please note that other medicines that you may be prescribed may have Tylenol as well. 05/31/17 06/05/17  Fatima Blank, MD  buPROPion (WELLBUTRIN XL) 150 MG 24 hr tablet TAKE 1 TABLET BY MOUTH ONCE A DAY IN EARLY AM 01/15/16   [provider]  fluticasone (FLONASE) 50 MCG/ACT nasal spray USE 2 SPRAYS INTO BOTH  NOSTRILS DAILY AS NEEDED  FOR ALLERGIES OR RHINITIS. 04/22/17   Janith Lima, MD  Indacaterol-Glycopyrrolate (UTIBRON NEOHALER) 27.5-15.6 MCG CAPS Place 1 Act into inhaler and inhale 2 (two) times daily. Patient taking differently: Place 1 Act into inhaler and inhale 2 (two) times daily as needed (shortness of breath).  06/25/16   Janith Lima, MD  lamoTRIgine (LAMICTAL) 200 MG tablet Take 200 mg by mouth at bedtime.    [provider]  nebivolol (BYSTOLIC) 5 MG tablet Take 1 tablet (5 mg total) daily by mouth. 03/25/17   Janith Lima, MD  oxyCODONE-acetaminophen (PERCOCET/ROXICET) 5-325 MG tablet Take 1 tablet by mouth every 8 (eight) hours as needed for severe pain. 04/26/17   Janith Lima, MD  potassium chloride SA (K-DUR,KLOR-CON) 20 MEQ tablet Take 1 tablet (20 mEq total) by mouth 3 (three) times daily. 04/05/17   Janith Lima, MD  telmisartan (  MICARDIS) 40 MG tablet Take 1 tablet (40 mg total) by mouth daily. 05/29/17   Janith Lima, MD  triamterene-hydrochlorothiazide (DYAZIDE) 37.5-25 MG capsule Take 1 each (1 capsule total) by mouth daily. 05/29/17   Janith Lima, MD                                                                                                                                    Past Surgical History Past Surgical History:    Procedure Laterality Date  . ABDOMINAL HYSTERECTOMY     1 ovary removed  . APPENDECTOMY    . APPLICATION OF WOUND VAC  11/24/2014   Procedure: APPLICATION OF WOUND VAC;  Surgeon: Donnie Mesa, MD;  Location: [facility] ORS;  Service: General;;  . BOWEL RESECTION N/A 11/24/2014   Procedure: SMALL BOWEL RESECTION;  Surgeon: Donnie Mesa, MD;  Location: [facility];  Service: General;  Laterality: N/A;  . BREAST EXCISIONAL BIOPSY Right 1972  . BREAST SURGERY Right 40 yrs ago   benign tumor removed  . LAPAROTOMY N/A 11/24/2014   Procedure: EXPLORATORY LAPAROTOMY EXTENSIVE LYSIS OF ADHESIONS SAMLL BOWEL RESECTION RIGHT HEMI COLECTOMY WOUND VAC APPLICATION. ;  Surgeon: Donnie Mesa, MD;  Location: [facility];  Service: General;  Laterality: N/A;  . LYSIS OF ADHESION  11/24/2014   Procedure: LYSIS OF ADHESION (3 HRS);  Surgeon: Donnie Mesa, MD;  Location: [facility];  Service: General;;  . PARTIAL COLECTOMY  11/24/2014   Procedure: RIGHT HEMI COLECTOMY;  Surgeon: Donnie Mesa, MD;  Location: [facility];  Service: General;;  . SMALL INTESTINE SURGERY  age 79   Family History Family History  Problem Relation Age of Onset  . Hypertension Mother   . Arthritis Father   . Alcohol abuse Paternal Uncle   . Diabetes Paternal Grandmother   . Stroke Paternal Grandmother     Social History Social History   Tobacco Use  . Smoking status: Current Every Day Smoker    Packs/day: 1.00    Years: 35.00    Pack years: 35.00    Types: Cigarettes  . Smokeless tobacco: Never Used  Substance Use Topics  . Alcohol use: No  . Drug use: No   Allergies Azathioprine; Crestor [rosuvastatin calcium]; Lipitor [atorvastatin calcium]; and Lisinopril  Review of Systems Review of Systems  Constitutional: Negative for fever and malaise/fatigue.  Respiratory: Negative for shortness of breath.   Cardiovascular: Negative for palpitations.  Gastrointestinal: Positive for nausea. Negative for vomiting.  Neurological: Positive for  headaches.   All other systems are reviewed and are negative for acute change except as noted in the HPI  Physical Exam Vital Signs  I have reviewed the triage vital signs BP (!) 182/84 (BP Location: Right Arm)   Pulse (!) 52   Temp 98.5 F (36.9 C) (Oral)   Resp 16   LMP 05/14/1988   SpO2 100%   Physical Exam  Constitutional: She is oriented  to person, place, and time. She appears well-developed and well-nourished. No distress.  HENT:  Head: Normocephalic and atraumatic.  Nose: Nose normal.  Eyes: Conjunctivae and EOM are normal. Pupils are equal, round, and reactive to light. Right eye exhibits no discharge. Left eye exhibits no discharge. No scleral icterus.  Neck: Normal range of motion. Neck supple.  Cardiovascular: Normal rate and regular rhythm. Exam reveals no gallop and no friction rub.  No murmur heard. Pulmonary/Chest: Effort normal and breath sounds normal. No stridor. No respiratory distress. She has no rales.  Abdominal: Soft. She exhibits no distension. There is no tenderness.  Musculoskeletal: She exhibits no edema or tenderness.  Neurological: She is alert and oriented to person, place, and time.  Mental Status:  Alert and oriented to person, place, and time.  Attention and concentration normal.  Speech clear.  Recent memory is intact  Cranial Nerves:  II Visual Fields: Intact to confrontation. Visual fields intact. III, IV, VI: Pupils equal and reactive to light and near. Full eye movement without nystagmus  V Facial Sensation: Normal. No weakness of masticatory muscles  VII: No facial weakness or asymmetry  VIII Auditory Acuity: Grossly normal  IX/X: The uvula is midline; the palate elevates symmetrically  XI: Normal sternocleidomastoid and trapezius strength  XII: The tongue is midline. No atrophy or fasciculations.   Motor System: Muscle Strength: 5/5 and symmetric in the upper and lower extremities. No pronation or drift.  Muscle Tone: Tone and  muscle bulk are normal in the upper and lower extremities.   Reflexes: DTRs: 1+ and symmetrical in all four extremities. No Clonus Coordination: Intact finger-to-nose, heel-to-shin. No tremor.  Sensation: Intact to light touch, and pinprick.   Gait: Routine gait normal.   Skin: Skin is warm and dry. No rash noted. She is not diaphoretic. No erythema.  Psychiatric: She has a normal mood and affect.  Vitals reviewed.   ED Results and Treatments Labs (all labs ordered are listed, but only abnormal results are displayed) Labs Reviewed - No data to display                                                                                                                       EKG  EKG Interpretation  Date/Time:    Ventricular Rate:    PR Interval:    QRS Duration:   QT Interval:    QTC Calculation:   R Axis:     Text Interpretation:        Radiology Ct Head Wo Contrast  Result Date: 05/30/2017 CLINICAL DATA:  Headaches for 2 weeks EXAM: CT HEAD WITHOUT CONTRAST TECHNIQUE: Contiguous axial images were obtained from the base of the skull through the vertex without intravenous contrast. COMPARISON:  CT brain 11/07/2004 FINDINGS: Brain: No acute territorial infarction, hemorrhage or intracranial mass is visualized. Patchy hypodensity in the white matter consistent with small vessel ischemic change. Mild atrophy. Stable ventricle size. Vascular: No hyperdense vessels.  Carotid artery calcification. Skull: Normal. Negative  for fracture or focal lesion. Sinuses/Orbits: No acute finding. Other: None IMPRESSION: No CT evidence for acute intracranial abnormality. Atrophy and mild small vessel ischemic changes of the white matter. Electronically Signed   By: Donavan Foil M.D.   On: 05/30/2017 19:05   Pertinent labs & imaging results that were available during my care of the patient were reviewed by me and considered in my medical decision making (see chart for details).  Medications Ordered in  ED Medications  ketorolac (TORADOL) 30 MG/ML injection 30 mg (30 mg Intramuscular Given 05/30/17 1858)  metoCLOPramide (REGLAN) tablet 5 mg (5 mg Oral Given 05/30/17 1857)  diphenhydrAMINE (BENADRYL) capsule 25 mg (25 mg Oral Given 05/30/17 1857)  acetaminophen (TYLENOL) tablet 1,000 mg (1,000 mg Oral Given 05/31/17 0041)                                                                                                                                    Procedures Procedures  (including critical care time)  Medical Decision Making / ED Course I have reviewed the nursing notes for this encounter and the patient's prior records (if available in EHR or on provided paperwork).    Non focal neuro exam. No recent head trauma. No fever. Doubt meningitis. Doubt intracranial bleed. Doubt IIH.   This is a new type of headache for the patient.  CT scan obtained in triage was unremarkable for any acute process.  Patient also noted to have elevated blood pressures with systolics in 237S.  Labs obtained by her primary care provider yesterday did not reveal any evidence of endorgan damage.  She is not complaining of any chest pain or shortness of breath, concerning for cardiopulmonary endorgan damage.  treated with migraine cocktail which resulted in significant improvement in her headache.  Given her history of Crohn's, I offered to check hemoglobin since the last check was in October (4 months ago), as this may contribute to her headache however patient declined.  The patient is safe for discharge with strict return precautions.   Final Clinical Impression(s) / ED Diagnoses Final diagnoses:  Bad headache  Secondary hypertension    Disposition: Discharge  Condition: Good  I have discussed the results, Dx and Tx plan with the patient who expressed understanding and agree(s) with the plan. Discharge instructions discussed at great length. The patient was given strict return precautions who verbalized  understanding of the instructions. No further questions at time of discharge.    ED Discharge Orders        Ordered    acetaminophen (TYLENOL) 500 MG tablet  Every 8 hours     05/31/17 0045       Follow Up: Janith Lima, MD [address] [place] [address] [phone]  Schedule an appointment as soon as possible for a visit  in 3-5 days    This chart was dictated using voice recognition software.  Despite  best efforts to proofread,  errors can occur which can change the documentation meaning.     Fatima Blank, MD 05/31/17 [phone]

## 2017-05-31 NOTE — ED Notes (Signed)
Patient requested sandwich/drink (because we kept her here for greater than 6 hours. Provided with bag lunch.

## 2017-06-02 ENCOUNTER — Encounter: Payer: Self-pay | Admitting: Internal Medicine

## 2017-06-06 ENCOUNTER — Encounter: Payer: Self-pay | Admitting: Internal Medicine

## 2017-06-06 ENCOUNTER — Ambulatory Visit (INDEPENDENT_AMBULATORY_CARE_PROVIDER_SITE_OTHER): Payer: Medicare Other | Admitting: Internal Medicine

## 2017-06-06 ENCOUNTER — Other Ambulatory Visit: Payer: Medicare Other

## 2017-06-06 VITALS — BP 132/72 | HR 80 | Temp 97.8°F | Resp 16

## 2017-06-06 DIAGNOSIS — L989 Disorder of the skin and subcutaneous tissue, unspecified: Secondary | ICD-10-CM | POA: Diagnosis not present

## 2017-06-06 DIAGNOSIS — C44629 Squamous cell carcinoma of skin of left upper limb, including shoulder: Secondary | ICD-10-CM | POA: Diagnosis not present

## 2017-06-06 DIAGNOSIS — I10 Essential (primary) hypertension: Secondary | ICD-10-CM

## 2017-06-06 NOTE — Patient Instructions (Signed)

## 2017-06-06 NOTE — Progress Notes (Signed)
Subjective:  Patient ID: April May, female    DOB: 1956/01/30  Age: 62 y.o. MRN: [id number]  CC: Hypertension   HPI TYKISHA AREOLA presents for a BP check -she was seen in the ED a few days ago for headache associated to poorly controlled high blood pressure.  She has been compliant with her antihypertensives and she tells me the headache has resolved and her blood pressure has been well controlled.  She denies blurred vision, chest pain, or shortness of breath.  Today she complains of an enlarging lesion on her left forearm.  She says it has been there for several months.  She notices the area is dark but it does not bother her with any pain or swelling.  Outpatient Medications Prior to Visit  Medication Sig Dispense Refill  . buPROPion (WELLBUTRIN XL) 150 MG 24 hr tablet TAKE 1 TABLET BY MOUTH ONCE A DAY IN EARLY AM  2  . fluticasone (FLONASE) 50 MCG/ACT nasal spray USE 2 SPRAYS INTO BOTH  NOSTRILS DAILY AS NEEDED  FOR ALLERGIES OR RHINITIS. 32 g 3  . Indacaterol-Glycopyrrolate (UTIBRON NEOHALER) 27.5-15.6 MCG CAPS Place 1 Act into inhaler and inhale 2 (two) times daily. (Patient taking differently: Place 1 Act into inhaler and inhale 2 (two) times daily as needed (shortness of breath). ) 60 capsule 11  . lamoTRIgine (LAMICTAL) 200 MG tablet Take 200 mg by mouth at bedtime.    . nebivolol (BYSTOLIC) 5 MG tablet Take 1 tablet (5 mg total) daily by mouth. 90 tablet 1  . oxyCODONE-acetaminophen (PERCOCET/ROXICET) 5-325 MG tablet Take 1 tablet by mouth every 8 (eight) hours as needed for severe pain. 75 tablet 0  . potassium chloride SA (K-DUR,KLOR-CON) 20 MEQ tablet Take 1 tablet (20 mEq total) by mouth 3 (three) times daily. 90 tablet 3  . telmisartan (MICARDIS) 40 MG tablet Take 1 tablet (40 mg total) by mouth daily. 90 tablet 0  . triamterene-hydrochlorothiazide (DYAZIDE) 37.5-25 MG capsule Take 1 each (1 capsule total) by mouth daily. 90 capsule 0   No facility-administered  medications prior to visit.     ROS Review of Systems  Constitutional: Negative for diaphoresis, fatigue and unexpected weight change.  HENT: Negative.   Eyes: Negative for visual disturbance.  Respiratory: Negative for cough, chest tightness, shortness of breath and wheezing.   Cardiovascular: Negative for chest pain, palpitations and leg swelling.  Gastrointestinal: Negative for abdominal pain, constipation, diarrhea, nausea and vomiting.  Endocrine: Negative.   Genitourinary: Negative.  Negative for difficulty urinating.  Musculoskeletal: Negative.   Skin: Positive for color change. Negative for pallor and rash.  Allergic/Immunologic: Negative.   Neurological: Negative.  Negative for dizziness, weakness and headaches.  Hematological: Negative for adenopathy. Does not bruise/bleed easily.  Psychiatric/Behavioral: Negative.     Objective:  BP 132/72 (BP Location: Left Arm, Patient Position: Sitting, Cuff Size: Large)   Pulse 80   Temp 97.8 F (36.6 C)   Resp 16   LMP 05/14/1988   BP Readings from Last 3 Encounters:  06/06/17 132/72  05/31/17 (!) 182/84  05/29/17 (!) 160/110    Wt Readings from Last 3 Encounters:  05/29/17 223 lb (101.2 kg)  04/26/17 221 lb (100.2 kg)  03/25/17 218 lb (98.9 kg)    Physical Exam  Constitutional: She is oriented to person, place, and time. No distress.  HENT:  Mouth/Throat: Oropharynx is clear and moist. No oropharyngeal exudate.  Eyes: Conjunctivae are normal. Left eye exhibits no discharge. No scleral icterus.  Neck: Normal range of motion. Neck supple. No JVD present. No thyromegaly present.  Cardiovascular: Normal rate, regular rhythm and normal heart sounds. Exam reveals no gallop.  No murmur heard. Pulmonary/Chest: Effort normal and breath sounds normal. No respiratory distress. She has no wheezes. She has no rales.  Abdominal: Soft. Bowel sounds are normal. She exhibits no distension and no mass. There is no tenderness.    Musculoskeletal: Normal range of motion. She exhibits no edema, tenderness or deformity.       Arms: Lesion was cleaned with Betadine then prepped and draped in sterile fashion.  Local anesthesia with 1% lidocaine with epi.  2 cc were used.  A 6 mm punch incision was made to remove the lesion.  It was sent for biopsy.  1 simple interrupted suture using 4-0 nylon PC 3 was used to close the wound.  Hemostasis was maintained.  She tolerated the procedure well.  Neosporin and a dressing were applied.  Lymphadenopathy:    She has no cervical adenopathy.  Neurological: She is alert and oriented to person, place, and time.  Skin: Skin is warm and dry. No rash noted. She is not diaphoretic. No erythema. No pallor.  Vitals reviewed.   Lab Results  Component Value Date   WBC 9.8 03/12/2017   HGB 14.1 03/12/2017   HCT 42.6 03/12/2017   PLT 266 03/12/2017   GLUCOSE 101 (H) 05/29/2017   CHOL 125 01/31/2017   TRIG 129.0 01/31/2017   HDL 42.10 01/31/2017   LDLCALC 57 01/31/2017   ALT 15 09/18/2016   AST 14 09/18/2016   NA 143 05/29/2017   K 3.4 (L) 05/29/2017   CL 103 05/29/2017   CREATININE 0.83 05/29/2017   BUN 10 05/29/2017   CO2 33 (H) 05/29/2017   TSH 0.75 06/25/2016   HGBA1C 6.0 01/31/2017   MICROALBUR 10 01/31/2017    Ct Head Wo Contrast  Result Date: 05/30/2017 CLINICAL DATA:  Headaches for 2 weeks EXAM: CT HEAD WITHOUT CONTRAST TECHNIQUE: Contiguous axial images were obtained from the base of the skull through the vertex without intravenous contrast. COMPARISON:  CT brain 11/07/2004 FINDINGS: Brain: No acute territorial infarction, hemorrhage or intracranial mass is visualized. Patchy hypodensity in the white matter consistent with small vessel ischemic change. Mild atrophy. Stable ventricle size. Vascular: No hyperdense vessels.  Carotid artery calcification. Skull: Normal. Negative for fracture or focal lesion. Sinuses/Orbits: No acute finding. Other: None IMPRESSION: No CT evidence  for acute intracranial abnormality. Atrophy and mild small vessel ischemic changes of the white matter. Electronically Signed   By: Donavan Foil M.D.   On: 05/30/2017 19:05    Assessment & Plan:   Elenor was seen today for hypertension.  Diagnoses and all orders for this visit:  Unknown skin lesion- Excision and biopsy performed.  The pathologist reports suspicion that this is cystic squamous cell carcinoma. -     Dermatology pathology -     Dermatology pathology; Future  Squamous cell carcinoma of arm, left- I have asked her to see dermatology to see if she needs to have a wider excision to be certain that all the malignancy has been removed. -     Ambulatory referral to Dermatology  HYPERTENSION, BENIGN ESSENTIAL- Her blood pressure is well controlled.   I am having Lorissa L. Reznik maintain her lamoTRIgine, buPROPion, Indacaterol-Glycopyrrolate, nebivolol, potassium chloride SA, fluticasone, oxyCODONE-acetaminophen, telmisartan, and triamterene-hydrochlorothiazide.  No orders of the defined types were placed in this encounter.    Follow-up: Return in  about 1 week (around 06/13/2017).  Scarlette Calico, MD

## 2017-06-11 ENCOUNTER — Encounter: Payer: Self-pay | Admitting: Internal Medicine

## 2017-06-11 DIAGNOSIS — Z85828 Personal history of other malignant neoplasm of skin: Secondary | ICD-10-CM

## 2017-06-11 DIAGNOSIS — L989 Disorder of the skin and subcutaneous tissue, unspecified: Secondary | ICD-10-CM | POA: Insufficient documentation

## 2017-06-11 DIAGNOSIS — C44629 Squamous cell carcinoma of skin of left upper limb, including shoulder: Secondary | ICD-10-CM

## 2017-06-11 HISTORY — DX: Personal history of other malignant neoplasm of skin: Z85.828

## 2017-06-11 HISTORY — DX: Squamous cell carcinoma of skin of left upper limb, including shoulder: C44.629

## 2017-06-12 ENCOUNTER — Telehealth: Payer: Self-pay | Admitting: Internal Medicine

## 2017-06-12 NOTE — Telephone Encounter (Signed)
Copied from [place] [id number]. Topic: Quick Communication - See Telephone Encounter >> Jun 12, 2017  9:44 AM Conception Chancy, NT wrote: CRM for notification. See Telephone encounter for:  06/12/17.  Amiel from [facility] is needing Dr. Ronnald Ramp CMA to clarify a medication potassium chloride SA 27meq tablet it interacts with the triamterene-hydrochlorothiazide capsule.   CB# [phone]  Reference # [id number]  Fax# [phone]

## 2017-06-12 NOTE — Telephone Encounter (Signed)
Pharmacy contacted and informed of same.

## 2017-06-12 NOTE — Telephone Encounter (Signed)
Pharmacy states that the two medications triamterene and potassium states that it may cause hyperkalemia.   Please advise.

## 2017-06-12 NOTE — Telephone Encounter (Signed)
I know this I monitor her K+ level closely

## 2017-06-13 ENCOUNTER — Encounter: Payer: Self-pay | Admitting: Internal Medicine

## 2017-06-14 DIAGNOSIS — Z1283 Encounter for screening for malignant neoplasm of skin: Secondary | ICD-10-CM | POA: Diagnosis not present

## 2017-06-14 DIAGNOSIS — C44629 Squamous cell carcinoma of skin of left upper limb, including shoulder: Secondary | ICD-10-CM | POA: Diagnosis not present

## 2017-06-14 DIAGNOSIS — D225 Melanocytic nevi of trunk: Secondary | ICD-10-CM | POA: Diagnosis not present

## 2017-06-14 DIAGNOSIS — B070 Plantar wart: Secondary | ICD-10-CM | POA: Diagnosis not present

## 2017-06-14 DIAGNOSIS — D485 Neoplasm of uncertain behavior of skin: Secondary | ICD-10-CM | POA: Diagnosis not present

## 2017-06-17 ENCOUNTER — Ambulatory Visit: Payer: Medicare Other | Admitting: Internal Medicine

## 2017-06-21 DIAGNOSIS — E119 Type 2 diabetes mellitus without complications: Secondary | ICD-10-CM | POA: Diagnosis not present

## 2017-06-21 DIAGNOSIS — Z0100 Encounter for examination of eyes and vision without abnormal findings: Secondary | ICD-10-CM | POA: Diagnosis not present

## 2017-06-21 LAB — HM DIABETES EYE EXAM

## 2017-06-27 ENCOUNTER — Encounter: Payer: Self-pay | Admitting: Internal Medicine

## 2017-06-27 DIAGNOSIS — C44629 Squamous cell carcinoma of skin of left upper limb, including shoulder: Secondary | ICD-10-CM | POA: Diagnosis not present

## 2017-06-27 DIAGNOSIS — D230 Other benign neoplasm of skin of lip: Secondary | ICD-10-CM | POA: Diagnosis not present

## 2017-06-27 DIAGNOSIS — D485 Neoplasm of uncertain behavior of skin: Secondary | ICD-10-CM | POA: Diagnosis not present

## 2017-07-01 ENCOUNTER — Other Ambulatory Visit: Payer: Self-pay | Admitting: Internal Medicine

## 2017-07-01 ENCOUNTER — Encounter: Payer: Self-pay | Admitting: Internal Medicine

## 2017-07-01 DIAGNOSIS — F321 Major depressive disorder, single episode, moderate: Secondary | ICD-10-CM

## 2017-07-01 MED ORDER — BUPROPION HCL ER (XL) 150 MG PO TB24: ORAL_TABLET | ORAL | 1 refills | Status: DC

## 2017-07-01 MED ORDER — LAMOTRIGINE 200 MG PO TABS: 200.0000 mg | ORAL_TABLET | Freq: Every day | ORAL | 1 refills | Status: DC

## 2017-07-15 ENCOUNTER — Encounter (INDEPENDENT_AMBULATORY_CARE_PROVIDER_SITE_OTHER): Payer: Medicare Other | Admitting: Ophthalmology

## 2017-07-17 ENCOUNTER — Telehealth: Payer: Self-pay | Admitting: Internal Medicine

## 2017-07-17 NOTE — Telephone Encounter (Signed)
Copied from [place] [id number]. Topic: Inquiry >> Jul 17, 2017  9:05 AM Arletha Grippe wrote: Reason for CRM: pt was given an eye exam by [facility], and pt was told that they would send the results to the office.  Pt has not received the results and would like for a call back to tell her the results.  Please call [phone]

## 2017-07-18 ENCOUNTER — Ambulatory Visit: Payer: Medicare Other | Admitting: Internal Medicine

## 2017-07-18 DIAGNOSIS — Z0289 Encounter for other administrative examinations: Secondary | ICD-10-CM

## 2017-07-18 NOTE — Telephone Encounter (Addendum)
Spoke to pt and she stated that the home visit was for her eye exam. [facility] and they stated that did not send anyone out to the pt home.   Pt contacted and informed of same. Pt will let me know what information she is able to find out.

## 2017-07-22 ENCOUNTER — Encounter: Payer: Self-pay | Admitting: Internal Medicine

## 2017-07-22 DIAGNOSIS — Z124 Encounter for screening for malignant neoplasm of cervix: Secondary | ICD-10-CM

## 2017-07-23 ENCOUNTER — Other Ambulatory Visit: Payer: Self-pay | Admitting: Internal Medicine

## 2017-07-24 ENCOUNTER — Encounter: Payer: Self-pay | Admitting: Gastroenterology

## 2017-07-24 ENCOUNTER — Encounter: Payer: Self-pay | Admitting: Internal Medicine

## 2017-07-27 ENCOUNTER — Ambulatory Visit (INDEPENDENT_AMBULATORY_CARE_PROVIDER_SITE_OTHER): Payer: Medicare Other | Admitting: Family Medicine

## 2017-07-27 ENCOUNTER — Encounter: Payer: Self-pay | Admitting: Family Medicine

## 2017-07-27 VITALS — BP 160/100 | HR 81 | Temp 98.6°F | Resp 12 | Ht 63.0 in | Wt 221.8 lb

## 2017-07-27 DIAGNOSIS — J988 Other specified respiratory disorders: Secondary | ICD-10-CM | POA: Diagnosis not present

## 2017-07-27 DIAGNOSIS — J441 Chronic obstructive pulmonary disease with (acute) exacerbation: Secondary | ICD-10-CM | POA: Diagnosis not present

## 2017-07-27 DIAGNOSIS — I10 Essential (primary) hypertension: Secondary | ICD-10-CM | POA: Diagnosis not present

## 2017-07-27 MED ORDER — DOXYCYCLINE HYCLATE 100 MG PO TABS: 100.0000 mg | ORAL_TABLET | Freq: Two times a day (BID) | ORAL | 0 refills | Status: AC

## 2017-07-27 MED ORDER — IPRATROPIUM-ALBUTEROL 0.5-2.5 (3) MG/3ML IN SOLN
3.0000 mL | Freq: Once | RESPIRATORY_TRACT | Status: AC
  Administered 2017-07-27: 3 mL via RESPIRATORY_TRACT

## 2017-07-27 MED ORDER — ALBUTEROL SULFATE HFA 108 (90 BASE) MCG/ACT IN AERS: 2.0000 | INHALATION_SPRAY | Freq: Four times a day (QID) | RESPIRATORY_TRACT | 0 refills | Status: DC | PRN

## 2017-07-27 NOTE — Patient Instructions (Addendum)
  Ms.April May I have seen you today for an acute visit.  A few things to remember from today's visit:   COPD exacerbation ([place]) - Plan: doxycycline (VIBRA-TABS) 100 MG tablet, ipratropium-albuterol (DUONEB) 0.5-2.5 (3) MG/3ML nebulizer solution 3 mL  Respiratory tract infection - Plan: doxycycline (VIBRA-TABS) 100 MG tablet, albuterol (PROVENTIL HFA;VENTOLIN HFA) 108 (90 Base) MCG/ACT inhaler, ipratropium-albuterol (DUONEB) 0.5-2.5 (3) MG/3ML nebulizer solution 3 mL  viral infections are self-limited and we treat each symptom depending of severity.  Over the counter medications as decongestants and cold medications usually help, they need to be taken with caution if there is a history of high blood pressure or palpitations. Tylenol and/or Ibuprofen also helps with most symptoms (headache, muscle aching, fever,etc) Plenty of fluids. Honey helps with cough. Steam inhalations helps with runny nose, nasal congestion, and may prevent sinus infections. Cough and nasal congestion could last a few days and sometimes weeks. Please follow in not any better in 1-2 weeks or if symptoms get worse.  Medications prescribed today are intended for short period of time and will not be refill upon request, a follow up appointment might be necessary to discuss continuation of of treatment if appropriate.  Albuterol inh 2 puff every 6 hours for a week then as needed for wheezing or shortness of breath.  I think you will benefit from Prednisone. Plain Mucinex over the counter. SMOKING CESSATION.  Most likely viral but if not better in 2-3 days start antibiotic.   ELEVATED BLOOD PRESSURE: RESUME MICARDIS 40 MG DAILY AND FOLLOW WITH PCP IN 7 DAYS,BEFIRE IF NEEDED.     In general please monitor for signs of worsening symptoms and seek immediate medical attention if any concerning.   I hope you get better soon!

## 2017-07-27 NOTE — Progress Notes (Signed)
ACUTE VISIT  HPI:  Chief Complaint  Patient presents with  . Cough    x 4 days, expectorating white sputum    Ms.April May is a 62 y.o.female here today complaining of 4 days of respiratory symptoms.  Initially she had sore throat, nasal congestion, and rhinorrhea but all these symptoms have improved. She is now having intermittent wheezing, exertional dyspnea, and productive cough for the past 2 days.  Today symptoms are not "as bad" as they were initially.  History of COPD, currently she is on [employer].  She does not have Albuterol at home, still smoking.  Cough  This is a new problem. The current episode started in the past 7 days. The problem has been waxing and waning. The cough is productive of sputum. Associated symptoms include myalgias, nasal congestion, postnasal drip, rhinorrhea, shortness of breath and wheezing. Pertinent negatives include no chest pain, chills, ear congestion, ear pain, eye redness, fever, headaches, heartburn, hemoptysis or rash. The symptoms are aggravated by exercise. Risk factors for lung disease include smoking/tobacco exposure. She has tried ipratropium inhaler for the symptoms. The treatment provided mild relief. Her past medical history is significant for environmental allergies.     "Ribs hurst" when she is coughing   No Hx of recent travel. No sick contact. No known insect bite.  Hx of allergies: yes.  Allergic rhinitis, she is currently on Flonase nasal spray.  OTC medications for this problem: TheraFlu and Tylenol.    HTN:  BP elevated today. She discontinue antihypertensive medication a few weeks ago because low BP when taking Bystolic 10 mg, telmisartan 40 mg, and Dyazide 37.5-25 mg daily. Because BP was elevated at home, she resume Bystolic this morning. She denies unusual headache, chest pain, focal deficit.   Review of Systems  Constitutional: Positive for fatigue. Negative for activity change,  appetite change, chills and fever.  HENT: Positive for congestion, postnasal drip and rhinorrhea. Negative for ear pain and mouth sores.   Eyes: Negative for discharge, redness and visual disturbance.  Respiratory: Positive for cough, shortness of breath and wheezing. Negative for hemoptysis.   Cardiovascular: Negative for chest pain and leg swelling.  Gastrointestinal: Negative for abdominal pain, heartburn, nausea and vomiting.       No changes in bowel habits.  Genitourinary: Negative for decreased urine volume and hematuria.  Musculoskeletal: Positive for myalgias. Negative for gait problem.  Skin: Negative for rash.  Allergic/Immunologic: Positive for environmental allergies.  Neurological: Negative for syncope, weakness and headaches.  Hematological: Negative for adenopathy. Does not bruise/bleed easily.  Psychiatric/Behavioral: Negative for confusion. The patient is nervous/anxious.       Current Outpatient Medications on File Prior to Visit  Medication Sig Dispense Refill  . buPROPion (WELLBUTRIN XL) 150 MG 24 hr tablet TAKE 1 TABLET BY MOUTH ONCE A DAY IN EARLY AM 90 tablet 1  . [employer] PAIN RELIEF EXTRA STRENGTH 500 MG tablet TAKE 2 TABLETS (100MG) EVERY 8 HOURS FOR 5 DAYS - DO NOT EXCEED 4000MG OF THIS PER 24 HOURS 30 tablet 0  . fluticasone (FLONASE) 50 MCG/ACT nasal spray USE 2 SPRAYS INTO BOTH  NOSTRILS DAILY AS NEEDED  FOR ALLERGIES OR RHINITIS. 32 g 3  . Indacaterol-Glycopyrrolate (UTIBRON NEOHALER) 27.5-15.6 MCG CAPS Place 1 Act into inhaler and inhale 2 (two) times daily. (Patient taking differently: Place 1 Act into inhaler and inhale 2 (two) times daily as needed (shortness of breath). ) 60 capsule 11  . lamoTRIgine (  LAMICTAL) 200 MG tablet Take 1 tablet (200 mg total) by mouth at bedtime. 90 tablet 1  . nebivolol (BYSTOLIC) 5 MG tablet Take 1 tablet (5 mg total) daily by mouth. 90 tablet 1  . oxyCODONE-acetaminophen (PERCOCET/ROXICET) 5-325 MG tablet Take 1 tablet by mouth  every 8 (eight) hours as needed for severe pain. 75 tablet 0  . potassium chloride SA (K-DUR,KLOR-CON) 20 MEQ tablet Take 1 tablet (20 mEq total) by mouth 3 (three) times daily. 90 tablet 3  . telmisartan (MICARDIS) 40 MG tablet Take 1 tablet (40 mg total) by mouth daily. (Patient not taking: Reported on 07/27/2017) 90 tablet 0  . triamterene-hydrochlorothiazide (DYAZIDE) 37.5-25 MG capsule Take 1 each (1 capsule total) by mouth daily. (Patient not taking: Reported on 07/27/2017) 90 capsule 0   No current facility-administered medications on file prior to visit.      Past Medical History:  Diagnosis Date  . Allergy   . Crohn disease ([place])   . Depression   . Diabetes mellitus type II    borderline, md checks hemaglobin A 1 c at md office   . Hypertension   . Sleep apnea    does not use cpap since weight loss 1 and 1/2 years ago  . 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 Azathioprine Other (See Comments)    Side effects to pancrease     . Crestor [Rosuvastatin Calcium] Other (See Comments)    Muscle and joint aches  . Lipitor [Atorvastatin Calcium] Other (See Comments)    Muscle cramps  . Lisinopril Cough    Social History   Socioeconomic History  . Marital status: Divorced    Spouse name: None  . Number of children: None  . Years of education: None  . Highest education level: None  Social Needs  . Financial resource strain: None  . Food insecurity - worry: None  . Food insecurity - inability: None  . Transportation needs - medical: None  . Transportation needs - non-medical: None  Occupational History  . None  Tobacco Use  . Smoking status: Current Every Day Smoker    Packs/day: 1.00    Years: 35.00    Pack years: 35.00    Types: Cigarettes  . Smokeless tobacco: Never Used  Substance and Sexual Activity  . Alcohol use: No  . Drug use: No  . Sexual activity: Not Currently  Other Topics Concern  . None  Social History  Narrative  . None    Vitals:   07/27/17 1032  BP: (!) 160/100  Pulse: 81  Resp: 12  Temp: 98.6 F (37 C)  SpO2: 95%   Body mass index is 39.28 kg/m.    Physical Exam  Nursing note and vitals reviewed. Constitutional: She is oriented to person, place, and time. She appears well-developed. She does not appear ill. No distress.  HENT:  Head: Normocephalic and atraumatic.  Right Ear: External ear normal.  Left Ear: External ear normal.  Nose: Rhinorrhea present. Right sinus exhibits no maxillary sinus tenderness and no frontal sinus tenderness. Left sinus exhibits no maxillary sinus tenderness and no frontal sinus tenderness.  Mouth/Throat: Oropharynx is clear and moist and mucous membranes are normal.  Cerumen excess bilateral,cannot see TM's.  Eyes: Conjunctivae are normal.  Neck: No muscular tenderness present. No edema and no erythema present.  Cardiovascular: Normal rate and regular rhythm.  No murmur heard. Respiratory: Effort normal. No stridor. No respiratory distress. She has  wheezes. She has rhonchi. She has no rales.  GI: Soft. She exhibits no mass. There is no tenderness.  Musculoskeletal: She exhibits edema (Trace pitting LE edema,bilateral). She exhibits no tenderness.  Lymphadenopathy:    She has no cervical adenopathy.  Neurological: She is alert and oriented to person, place, and time. She has normal strength. Gait normal.  Skin: Skin is warm. No rash noted. No erythema.  Psychiatric: Her mood appears anxious.  Well groomed, good eye contact.      ASSESSMENT AND PLAN:   Ms.Kataryna was seen today for cough.  Diagnoses and all orders for this visit:  COPD exacerbation ([place])  Here in the office she received DuoNeb treatment, lung auscultation with no rales, rhonchi and wheezing improved. Albuterol inh 2 puff every 6 hours for a week then as needed for wheezing or shortness of breath.  Samples of Utibran were given. She does not want to take  Prednisone. Encouraged smoking cessation. Instructed about warning signs. Follow-up with PCP in 7-10 days..  -     doxycycline (VIBRA-TABS) 100 MG tablet; Take 1 tablet (100 mg total) by mouth 2 (two) times daily for 7 days. -     ipratropium-albuterol (DUONEB) 0.5-2.5 (3) MG/3ML nebulizer solution 3 mL  Respiratory tract infection  Most likely viral, so she can hold on antibiotic treatment for 2-3 days. Side effects of antibiotic discussed.  -     doxycycline (VIBRA-TABS) 100 MG tablet; Take 1 tablet (100 mg total) by mouth 2 (two) times daily for 7 days. -     albuterol (PROVENTIL HFA;VENTOLIN HFA) 108 (90 Base) MCG/ACT inhaler; Inhale 2 puffs into the lungs every 6 (six) hours as needed for wheezing or shortness of breath. -     ipratropium-albuterol (DUONEB) 0.5-2.5 (3) MG/3ML nebulizer solution 3 mL  HYPERTENSION, BENIGN ESSENTIAL  Poorly controlled. Recommend resuming Telmisartan 40 mg daily. Continue monitoring BP at home. Follow with PCP in 7-10 days.     -Ms. April May advised to seek attention immediately if symptoms worsen.      Betty G. Martinique, MD  [facility]. [place] office.

## 2017-08-01 ENCOUNTER — Telehealth: Payer: Self-pay | Admitting: Gastroenterology

## 2017-08-01 NOTE — Telephone Encounter (Signed)
ROI Faxed to [facility]

## 2017-08-12 ENCOUNTER — Other Ambulatory Visit: Payer: Self-pay | Admitting: Internal Medicine

## 2017-08-12 DIAGNOSIS — Z1231 Encounter for screening mammogram for malignant neoplasm of breast: Secondary | ICD-10-CM

## 2017-08-12 NOTE — Telephone Encounter (Signed)
Recv'd records from [facility] forwarded 58 pages to Dr. Kennedy Bucker

## 2017-08-14 ENCOUNTER — Other Ambulatory Visit: Payer: Self-pay | Admitting: Internal Medicine

## 2017-08-14 DIAGNOSIS — I10 Essential (primary) hypertension: Secondary | ICD-10-CM

## 2017-08-15 ENCOUNTER — Ambulatory Visit: Payer: Medicare Other | Admitting: Gynecology

## 2017-08-19 ENCOUNTER — Other Ambulatory Visit: Payer: Self-pay | Admitting: Internal Medicine

## 2017-08-19 DIAGNOSIS — I10 Essential (primary) hypertension: Secondary | ICD-10-CM

## 2017-08-19 DIAGNOSIS — T502X5A Adverse effect of carbonic-anhydrase inhibitors, benzothiadiazides and other diuretics, initial encounter: Secondary | ICD-10-CM

## 2017-08-19 DIAGNOSIS — E876 Hypokalemia: Secondary | ICD-10-CM

## 2017-08-20 ENCOUNTER — Encounter: Payer: Self-pay | Admitting: Internal Medicine

## 2017-08-20 ENCOUNTER — Other Ambulatory Visit (INDEPENDENT_AMBULATORY_CARE_PROVIDER_SITE_OTHER): Payer: Medicare Other

## 2017-08-20 ENCOUNTER — Telehealth: Payer: Self-pay

## 2017-08-20 ENCOUNTER — Ambulatory Visit (INDEPENDENT_AMBULATORY_CARE_PROVIDER_SITE_OTHER): Payer: Medicare Other | Admitting: Internal Medicine

## 2017-08-20 ENCOUNTER — Other Ambulatory Visit: Payer: Self-pay | Admitting: Internal Medicine

## 2017-08-20 VITALS — BP 180/108 | HR 66 | Temp 98.5°F | Resp 16 | Ht 63.0 in | Wt 222.2 lb

## 2017-08-20 DIAGNOSIS — T502X5A Adverse effect of carbonic-anhydrase inhibitors, benzothiadiazides and other diuretics, initial encounter: Secondary | ICD-10-CM | POA: Diagnosis not present

## 2017-08-20 DIAGNOSIS — E876 Hypokalemia: Secondary | ICD-10-CM

## 2017-08-20 DIAGNOSIS — I10 Essential (primary) hypertension: Secondary | ICD-10-CM

## 2017-08-20 DIAGNOSIS — J449 Chronic obstructive pulmonary disease, unspecified: Secondary | ICD-10-CM

## 2017-08-20 DIAGNOSIS — E559 Vitamin D deficiency, unspecified: Secondary | ICD-10-CM | POA: Insufficient documentation

## 2017-08-20 DIAGNOSIS — Z23 Encounter for immunization: Secondary | ICD-10-CM | POA: Diagnosis not present

## 2017-08-20 LAB — MAGNESIUM: Magnesium: 1.8 mg/dL (ref 1.5–2.5)

## 2017-08-20 LAB — BASIC METABOLIC PANEL
BUN: 4 mg/dL — ABNORMAL LOW (ref 6–23)
CHLORIDE: 104 meq/L (ref 96–112)
CO2: 33 meq/L — AB (ref 19–32)
CREATININE: 0.73 mg/dL (ref 0.40–1.20)
Calcium: 9.1 mg/dL (ref 8.4–10.5)
GFR: 103.95 mL/min (ref >60.00)
Glucose, Bld: 94 mg/dL (ref 70–99)
Potassium: 3.4 mEq/L — ABNORMAL LOW (ref 3.5–5.1)
SODIUM: 144 meq/L (ref 135–145)

## 2017-08-20 LAB — VITAMIN D 25 HYDROXY (VIT D DEFICIENCY, FRACTURES)

## 2017-08-20 MED ORDER — SPIRONOLACTONE 25 MG PO TABS: 25.0000 mg | ORAL_TABLET | Freq: Every day | ORAL | 1 refills | Status: DC

## 2017-08-20 MED ORDER — SPIRONOLACTONE 25 MG PO TABS: 25.0000 mg | ORAL_TABLET | Freq: Every day | ORAL | 0 refills | Status: DC

## 2017-08-20 MED ORDER — NEBIVOLOL HCL 2.5 MG PO TABS
2.5000 mg | ORAL_TABLET | Freq: Every day | ORAL | 1 refills | Status: DC
Start: 2017-08-20 — End: 2017-08-20

## 2017-08-20 MED ORDER — INDACATEROL-GLYCOPYRROLATE 27.5-15.6 MCG IN CAPS: 1.0000 | ORAL_CAPSULE | Freq: Two times a day (BID) | RESPIRATORY_TRACT | 11 refills | Status: DC

## 2017-08-20 MED ORDER — CHOLECALCIFEROL 50 MCG (2000 UT) PO TABS: 1.0000 | ORAL_TABLET | Freq: Every day | ORAL | 1 refills | Status: DC

## 2017-08-20 MED ORDER — CHOLECALCIFEROL 50 MCG (2000 UT) PO TABS
1.0000 | ORAL_TABLET | Freq: Every day | ORAL | 1 refills | Status: DC
Start: 2017-08-20 — End: 2017-08-20

## 2017-08-20 MED ORDER — ALBUTEROL SULFATE HFA 108 (90 BASE) MCG/ACT IN AERS: 2.0000 | INHALATION_SPRAY | Freq: Four times a day (QID) | RESPIRATORY_TRACT | 5 refills | Status: DC | PRN

## 2017-08-20 MED ORDER — NEBIVOLOL HCL 2.5 MG PO TABS: 2.5000 mg | ORAL_TABLET | Freq: Every day | ORAL | 1 refills | Status: DC

## 2017-08-20 NOTE — Patient Instructions (Signed)

## 2017-08-20 NOTE — Telephone Encounter (Signed)
Pt is requesting an rx for lorazepam. Please advise

## 2017-08-20 NOTE — Progress Notes (Signed)
Subjective:  Patient ID: April May, female    DOB: [birth year]  Age: 62 y.o. MRN: [id number]  CC: Hypertension   HPI [facility] presents for f/up - She complains that her blood pressure is not well controlled.  She is taking the ARB but not the diuretic or the beta-blocker.  She complains that when she takes that combination her blood pressure becomes too low.  She denies any recent headaches, blurred vision, chest pain, shortness of breath, palpitations, edema, fatigue.  Outpatient Medications Prior to Visit  Medication Sig Dispense Refill  . buPROPion (WELLBUTRIN XL) 150 MG 24 hr tablet TAKE 1 TABLET BY MOUTH ONCE A DAY IN EARLY AM 90 tablet 1  . [employer] PAIN RELIEF EXTRA STRENGTH 500 MG tablet TAKE 2 TABLETS (100MG) EVERY 8 HOURS FOR 5 DAYS - DO NOT EXCEED 4000MG OF THIS PER 24 HOURS 30 tablet 0  . fluticasone (FLONASE) 50 MCG/ACT nasal spray USE 2 SPRAYS INTO BOTH  NOSTRILS DAILY AS NEEDED  FOR ALLERGIES OR RHINITIS. 32 g 3  . lamoTRIgine (LAMICTAL) 200 MG tablet Take 1 tablet (200 mg total) by mouth at bedtime. 90 tablet 1  . oxyCODONE-acetaminophen (PERCOCET/ROXICET) 5-325 MG tablet Take 1 tablet by mouth every 8 (eight) hours as needed for severe pain. 75 tablet 0  . telmisartan (MICARDIS) 40 MG tablet Take 1 tablet (40 mg total) by mouth daily. 90 tablet 0  . albuterol (PROVENTIL HFA;VENTOLIN HFA) 108 (90 Base) MCG/ACT inhaler Inhale 2 puffs into the lungs every 6 (six) hours as needed for wheezing or shortness of breath. 1 Inhaler 0  . Indacaterol-Glycopyrrolate (UTIBRON NEOHALER) 27.5-15.6 MCG CAPS Place 1 Act into inhaler and inhale 2 (two) times daily. (Patient taking differently: Place 1 Act into inhaler and inhale 2 (two) times daily as needed (shortness of breath). ) 60 capsule 11  . potassium chloride SA (K-DUR,KLOR-CON) 20 MEQ tablet Take 1 tablet (20 mEq total) by mouth 3 (three) times daily. 90 tablet 3  . nebivolol (BYSTOLIC) 5 MG tablet Take 1 tablet  (5 mg total) daily by mouth. (Patient not taking: Reported on 08/20/2017) 90 tablet 1  . telmisartan (MICARDIS) 40 MG tablet TAKE 1 TABLET BY MOUTH EVERY DAY 90 tablet 1  . triamterene-hydrochlorothiazide (DYAZIDE) 37.5-25 MG capsule Take 1 each (1 capsule total) by mouth daily. (Patient not taking: Reported on 07/27/2017) 90 capsule 0  . triamterene-hydrochlorothiazide (DYAZIDE) 37.5-25 MG capsule TAKE 1 EACH (1 CAPSULE TOTAL) BY MOUTH DAILY. 90 capsule 0   No facility-administered medications prior to visit.     ROS Review of Systems  Constitutional: Negative.  Negative for appetite change, diaphoresis, fatigue and unexpected weight change.  HENT: Negative.   Eyes: Negative for visual disturbance.  Respiratory: Positive for cough. Negative for chest tightness, shortness of breath and wheezing.   Cardiovascular: Negative for chest pain, palpitations and leg swelling.  Gastrointestinal: Negative for abdominal pain, constipation, diarrhea, nausea and vomiting.  Endocrine: Negative.   Genitourinary: Negative.  Negative for difficulty urinating.  Musculoskeletal: Negative.  Negative for arthralgias and myalgias.  Skin: Negative.  Negative for color change.  Neurological: Negative.  Negative for dizziness, weakness and light-headedness.  Hematological: Negative for adenopathy. Does not bruise/bleed easily.  Psychiatric/Behavioral: Negative.     Objective:  BP (!) 180/108 (BP Location: Left Arm, Patient Position: Sitting, Cuff Size: Large)   Pulse 66   Temp 98.5 F (36.9 C) (Oral)   Resp 16   Ht 5' 3"  (1.6 m)  Wt 222 lb 4 oz (100.8 kg)   LMP 05/14/1988   SpO2 98%   BMI 39.37 kg/m   BP Readings from Last 3 Encounters:  08/20/17 (!) 180/108  07/27/17 (!) 160/100  06/06/17 132/72    Wt Readings from Last 3 Encounters:  08/20/17 222 lb 4 oz (100.8 kg)  07/27/17 221 lb 12 oz (100.6 kg)  05/29/17 223 lb (101.2 kg)    Physical Exam  Constitutional: She is oriented to person,  place, and time. No distress.  HENT:  Mouth/Throat: Oropharynx is clear and moist. No oropharyngeal exudate.  Eyes: Conjunctivae are normal.  Neck: Normal range of motion. Neck supple. No thyromegaly present.  Cardiovascular: Normal rate, regular rhythm, normal heart sounds and intact distal pulses. Exam reveals no gallop and no friction rub.  No murmur heard. Pulmonary/Chest: Effort normal and breath sounds normal. No stridor. No respiratory distress. She has no wheezes. She has no rales.  Abdominal: Soft. Bowel sounds are normal. She exhibits no distension and no mass. There is no tenderness. No hernia.  Musculoskeletal: Normal range of motion. She exhibits no edema or deformity.  Lymphadenopathy:    She has no cervical adenopathy.  Neurological: She is alert and oriented to person, place, and time.  Skin: Skin is warm and dry. She is not diaphoretic.  Vitals reviewed.   Lab Results  Component Value Date   WBC 9.8 03/12/2017   HGB 14.1 03/12/2017   HCT 42.6 03/12/2017   PLT 266 03/12/2017   GLUCOSE 94 08/20/2017   CHOL 125 01/31/2017   TRIG 129.0 01/31/2017   HDL 42.10 01/31/2017   LDLCALC 57 01/31/2017   ALT 15 09/18/2016   AST 14 09/18/2016   NA 144 08/20/2017   K 3.4 (L) 08/20/2017   CL 104 08/20/2017   CREATININE 0.73 08/20/2017   BUN 4 (L) 08/20/2017   CO2 33 (H) 08/20/2017   TSH 0.75 06/25/2016   HGBA1C 6.0 01/31/2017   MICROALBUR 10 01/31/2017    Ct Head Wo Contrast  Result Date: 05/30/2017 CLINICAL DATA:  Headaches for 2 weeks EXAM: CT HEAD WITHOUT CONTRAST TECHNIQUE: Contiguous axial images were obtained from the base of the skull through the vertex without intravenous contrast. COMPARISON:  CT brain 11/07/2004 FINDINGS: Brain: No acute territorial infarction, hemorrhage or intracranial mass is visualized. Patchy hypodensity in the white matter consistent with small vessel ischemic change. Mild atrophy. Stable ventricle size. Vascular: No hyperdense vessels.   Carotid artery calcification. Skull: Normal. Negative for fracture or focal lesion. Sinuses/Orbits: No acute finding. Other: None IMPRESSION: No CT evidence for acute intracranial abnormality. Atrophy and mild small vessel ischemic changes of the white matter. Electronically Signed   By: Donavan Foil M.D.   On: 05/30/2017 19:05    Assessment & Plan:   Jamara was seen today for hypertension.  Diagnoses and all orders for this visit:  COPD with chronic bronchitis ([place])- She is doing well on the current regimen. -     albuterol (PROVENTIL HFA;VENTOLIN HFA) 108 (90 Base) MCG/ACT inhaler; Inhale 2 puffs into the lungs every 6 (six) hours as needed for wheezing or shortness of breath. -     Indacaterol-Glycopyrrolate (UTIBRON NEOHALER) 27.5-15.6 MCG CAPS; Place 1 Act into inhaler and inhale 2 (two) times daily.  HYPERTENSION, BENIGN ESSENTIAL- Her blood pressure is not adequately well controlled and she has mild hypokalemia.  I have asked her to start taking a potassium sparing diuretic.  Will add nebivolol to the ARB as well.  I will also treat the vitamin D deficiency. -     Magnesium; Future -     Basic metabolic panel; Future -     VITAMIN D 25 Hydroxy (Vit-D Deficiency, Fractures); Future -     Discontinue: spironolactone (ALDACTONE) 25 MG tablet; Take 1 tablet (25 mg total) by mouth daily. -     Discontinue: nebivolol (BYSTOLIC) 2.5 MG tablet; Take 1 tablet (2.5 mg total) by mouth daily. -     nebivolol (BYSTOLIC) 2.5 MG tablet; Take 1 tablet (2.5 mg total) by mouth daily. -     spironolactone (ALDACTONE) 25 MG tablet; Take 1 tablet (25 mg total) by mouth daily.  Diuretic-induced hypokalemia- As above -     Magnesium; Future -     Basic metabolic panel; Future -     Discontinue: spironolactone (ALDACTONE) 25 MG tablet; Take 1 tablet (25 mg total) by mouth daily. -     spironolactone (ALDACTONE) 25 MG tablet; Take 1 tablet (25 mg total) by mouth daily.  Vitamin D deficiency -      Discontinue: Cholecalciferol 2000 units TABS; Take 1 tablet (2,000 Units total) by mouth daily. -     Cholecalciferol 2000 units TABS; Take 1 tablet (2,000 Units total) by mouth daily.  Need for pneumococcal vaccination -     Pneumococcal polysaccharide vaccine 23-valent greater than or equal to 2yo subcutaneous/IM   I have discontinued Preston W. Lariccia's triamterene-hydrochlorothiazide and triamterene-hydrochlorothiazide. I am also having her maintain her fluticasone, oxyCODONE-acetaminophen, telmisartan, lamoTRIgine, buPROPion, [employer]S PAIN RELIEF EXTRA STRENGTH, albuterol, Indacaterol-Glycopyrrolate, nebivolol, spironolactone, and Cholecalciferol.  Meds ordered this encounter  Medications  . albuterol (PROVENTIL HFA;VENTOLIN HFA) 108 (90 Base) MCG/ACT inhaler    Sig: Inhale 2 puffs into the lungs every 6 (six) hours as needed for wheezing or shortness of breath.    Dispense:  1 Inhaler    Refill:  5  . DISCONTD: spironolactone (ALDACTONE) 25 MG tablet    Sig: Take 1 tablet (25 mg total) by mouth daily.    Dispense:  90 tablet    Refill:  0  . Indacaterol-Glycopyrrolate (UTIBRON NEOHALER) 27.5-15.6 MCG CAPS    Sig: Place 1 Act into inhaler and inhale 2 (two) times daily.    Dispense:  60 capsule    Refill:  11  . DISCONTD: nebivolol (BYSTOLIC) 2.5 MG tablet    Sig: Take 1 tablet (2.5 mg total) by mouth daily.    Dispense:  90 tablet    Refill:  1  . nebivolol (BYSTOLIC) 2.5 MG tablet    Sig: Take 1 tablet (2.5 mg total) by mouth daily.    Dispense:  90 tablet    Refill:  1  . spironolactone (ALDACTONE) 25 MG tablet    Sig: Take 1 tablet (25 mg total) by mouth daily.    Dispense:  90 tablet    Refill:  1  . DISCONTD: Cholecalciferol 2000 units TABS    Sig: Take 1 tablet (2,000 Units total) by mouth daily.    Dispense:  90 tablet    Refill:  1  . Cholecalciferol 2000 units TABS    Sig: Take 1 tablet (2,000 Units total) by mouth daily.    Dispense:  90 tablet    Refill:  1      Follow-up: Return in about 6 weeks (around 10/01/2017).  Scarlette Calico, MD

## 2017-08-21 ENCOUNTER — Encounter: Payer: Self-pay | Admitting: Internal Medicine

## 2017-08-26 MED ORDER — INDACATEROL-GLYCOPYRROLATE 27.5-15.6 MCG IN CAPS: 1.0000 | ORAL_CAPSULE | Freq: Two times a day (BID) | RESPIRATORY_TRACT | 11 refills | Status: DC

## 2017-08-26 NOTE — Addendum Note (Signed)
Addended by: Aviva Signs M on: 08/26/2017 12:49 PM   Modules accepted: Orders

## 2017-08-27 ENCOUNTER — Encounter: Payer: Self-pay | Admitting: Internal Medicine

## 2017-08-27 ENCOUNTER — Telehealth: Payer: Self-pay

## 2017-08-27 NOTE — Telephone Encounter (Signed)
Pt states that she had her DM eye exam done at home. Notes received from [facility] and those notes indicated that there is no DM retinopathy. Last Eye exam note from [facility] stated that there was mild retinopathy noted in that exam.

## 2017-08-29 ENCOUNTER — Other Ambulatory Visit: Payer: Self-pay | Admitting: Internal Medicine

## 2017-08-29 DIAGNOSIS — I10 Essential (primary) hypertension: Secondary | ICD-10-CM

## 2017-09-06 ENCOUNTER — Ambulatory Visit: Payer: Medicare Other | Admitting: Gastroenterology

## 2017-09-09 NOTE — Telephone Encounter (Signed)
Pt informed that the home visit note and the last visit with [facility] and [facility] [place] were not the same. The [facility] showed retinopathy and the home visit did not.

## 2017-09-11 ENCOUNTER — Ambulatory Visit (INDEPENDENT_AMBULATORY_CARE_PROVIDER_SITE_OTHER)
Admission: RE | Admit: 2017-09-11 | Discharge: 2017-09-11 | Disposition: A | Discharge status: Home / Self Care | Payer: Medicare Other | Source: Ambulatory Visit | Attending: Family | Admitting: Family

## 2017-09-11 ENCOUNTER — Ambulatory Visit (INDEPENDENT_AMBULATORY_CARE_PROVIDER_SITE_OTHER): Payer: Medicare Other | Admitting: Family

## 2017-09-11 ENCOUNTER — Encounter: Payer: Self-pay | Admitting: Family

## 2017-09-11 VITALS — BP 172/100 | HR 70 | Temp 98.2°F | Ht 63.0 in | Wt 221.0 lb

## 2017-09-11 DIAGNOSIS — J984 Other disorders of lung: Secondary | ICD-10-CM | POA: Diagnosis not present

## 2017-09-11 DIAGNOSIS — I10 Essential (primary) hypertension: Secondary | ICD-10-CM

## 2017-09-11 DIAGNOSIS — R0789 Other chest pain: Secondary | ICD-10-CM

## 2017-09-11 MED ORDER — ACETAMINOPHEN 500 MG PO TABS: 500.0000 mg | ORAL_TABLET | Freq: Three times a day (TID) | ORAL | 0 refills | Status: DC | PRN

## 2017-09-11 MED ORDER — TELMISARTAN 40 MG PO TABS: 40.0000 mg | ORAL_TABLET | Freq: Every day | ORAL | 0 refills | Status: DC

## 2017-09-11 NOTE — Progress Notes (Signed)
April May is a 62 y.o. female with the following history as recorded in EpicCare:  Patient Active Problem List   Diagnosis Date Noted  . Vitamin D deficiency 08/20/2017  . 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 DDD (degenerative disc disease), cervical 04/27/2017  . GAD (generalized anxiety disorder) 12/03/2016  . Aortic ejection murmur 03/25/2016  . Crohn's disease of both small and large intestine with fistula ([place]) 11/24/2014  . Crohn's regional enteritis ([place]) 05/20/2014  . COPD with chronic bronchitis ([place]) 03/11/2014  . Diuretic-induced hypokalemia 01/22/2013  . Neck pain, bilateral 01/22/2013  . Hyperlipidemia LDL goal <130 06/13/2011  . Sleep apnea 04/27/2011  . Prediabetes 03/01/2011  . Obesity, Class II, BMI 35-39.9, with comorbidity 04/01/2009  . TOBACCO ABUSE 02/24/2008  . ALLERGIC RHINITIS 02/24/2008  . DEPRESSION 08/26/2007  . HYPERTENSION, BENIGN ESSENTIAL 08/04/2007    Current Outpatient Medications  Medication Sig Dispense Refill  . acetaminophen ([facility] PAIN RELIEF EXTRA STRENGTH) 500 MG tablet Take 1 tablet (500 mg total) by mouth every 8 (eight) hours as needed for moderate pain. 60 tablet 0  . albuterol (PROVENTIL HFA;VENTOLIN HFA) 108 (90 Base) MCG/ACT inhaler Inhale 2 puffs into the lungs every 6 (six) hours as needed for wheezing or shortness of breath. 1 Inhaler 5  . buPROPion (WELLBUTRIN XL) 150 MG 24 hr tablet TAKE 1 TABLET BY MOUTH ONCE A DAY IN EARLY AM 90 tablet 1  . Cholecalciferol 2000 units TABS Take 1 tablet (2,000 Units total) by mouth daily. 90 tablet 1  . clonazePAM (KLONOPIN) 0.5 MG tablet     . fluticasone (FLONASE) 50 MCG/ACT nasal spray USE 2 SPRAYS INTO BOTH  NOSTRILS DAILY AS NEEDED  FOR ALLERGIES OR RHINITIS. 32 g 3  . Indacaterol-Glycopyrrolate (UTIBRON NEOHALER) 27.5-15.6 MCG CAPS Place 1 Act into inhaler and inhale 2 (two) times daily. 60 capsule 11  . lamoTRIgine (LAMICTAL) 200 MG tablet Take 1 tablet (200 mg  total) by mouth at bedtime. 90 tablet 1  . nebivolol (BYSTOLIC) 2.5 MG tablet Take 1 tablet (2.5 mg total) by mouth daily. 90 tablet 1  . oxyCODONE-acetaminophen (PERCOCET/ROXICET) 5-325 MG tablet Take 1 tablet by mouth every 8 (eight) hours as needed for severe pain. 75 tablet 0  . potassium chloride SA (K-DUR,KLOR-CON) 20 MEQ tablet Take 1 tablet (20 mEq total) by mouth 2 (two) times daily. 60 tablet 2  . spironolactone (ALDACTONE) 25 MG tablet Take 1 tablet (25 mg total) by mouth daily. 90 tablet 1  . telmisartan (MICARDIS) 40 MG tablet Take 1 tablet (40 mg total) by mouth daily. 90 tablet 0   No current facility-administered medications for this visit.     Allergies: Azathioprine; Crestor [rosuvastatin calcium]; Lipitor [atorvastatin calcium]; and Lisinopril  Past Medical History:  Diagnosis Date  . Allergy   . Benign breast lumps   . COPD (chronic obstructive pulmonary disease) ([place])   . Crohn disease ([place])   . Depression   . Depression   . Diabetes mellitus type II    borderline, md checks hemaglobin A 1 c at md office   . Hypertension   . Sleep apnea    does not use cpap since weight loss 1 and 1/2 years ago  . Small bowel obstruction ([place])   . Squamous cell carcinoma of arm, 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    Past Surgical History:  Procedure Laterality Date  . ABDOMINAL HYSTERECTOMY     1 ovary removed  . APPENDECTOMY    . APPLICATION OF  WOUND VAC  11/24/2014   Procedure: APPLICATION OF WOUND VAC;  Surgeon: Donnie Mesa, MD;  Location: [facility];  Service: General;;  . BOWEL RESECTION N/A 11/24/2014   Procedure: SMALL BOWEL RESECTION;  Surgeon: Donnie Mesa, MD;  [facility]: [facility];  Service: General;  Laterality: N/A;  . BREAST EXCISIONAL BIOPSY Right 1972  . BREAST SURGERY Right 40 yrs ago   benign tumor removed  . LAPAROTOMY N/A 11/24/2014   Procedure: EXPLORATORY LAPAROTOMY EXTENSIVE LYSIS OF ADHESIONS SAMLL BOWEL RESECTION RIGHT HEMI COLECTOMY WOUND VAC APPLICATION. ;  Surgeon: Donnie Mesa, MD;  [facility];  Service: General;  Laterality: N/A;  . LYSIS OF ADHESION  11/24/2014   Procedure: LYSIS OF ADHESION (3 HRS);  Surgeon: Donnie Mesa, MD;  [facility];  Service: General;;  . PARTIAL COLECTOMY  11/24/2014   Procedure: RIGHT HEMI COLECTOMY;  Surgeon: Donnie Mesa, MD;  [facility];  Service: General;;  . SMALL INTESTINE SURGERY  age 68    Family History  Problem Relation Age of Onset  . Hypertension Mother   . Arthritis Father   . Alcohol abuse Paternal Uncle   . Diabetes Paternal Grandmother   . Stroke Paternal Grandmother     Social History   Tobacco Use  . Smoking status: Current Every Day Smoker    Packs/day: 1.00    Years: 35.00    Pack years: 35.00    Types: Cigarettes  . Smokeless tobacco: Never Used  Substance Use Topics  . Alcohol use: No    Subjective:  Patient states she woke up 3 days ago with sharp pain in her upper chest/ neck pain; denies any chest pain or shortness of breath on exertion; felt like pain was "shooting through her upper chest" into her back; has been taking Percocet with some relief; is asking for a refill on Tylenol today so she doesn't have to take as much Percocet; Patient is a poor historian and it is difficult to follow on her concerns. She contradicts herself during the visit- she told the MA that the pain had resolved as of this morning but states to me that she is still having symptoms.  She notes her blood pressure is elevated today because she has not taken her medication this morning. In reviewing her medications, she notes she is not taking her Micardis or Bystolic; has only been taking her Spironolactone; notes she could not get the Bystolic at 2.5 mg and did not understand that she needed to let her PCP know;      Objective:  Vitals:   09/11/17 0833  BP: (!) 172/100  Pulse: 70  Temp: 98.2 F (36.8 C)  TempSrc: Oral  SpO2: 97%  Weight: 221 lb (100.2 kg)  Height: 5' 3"  (1.6 m)    General:  Well developed, well nourished, in no acute distress  Skin : Warm and dry.  Head: Normocephalic and atraumatic  Eyes: Sclera and conjunctiva clear; pupils round and reactive to light; extraocular movements intact  Ears: External normal; canals clear; tympanic membranes normal  Oropharynx: Pink, supple. No suspicious lesions  Neck: Supple without thyromegaly, adenopathy  Lungs: Respirations unlabored; clear to auscultation bilaterally without wheeze, rales, rhonchi  CVS exam: normal rate and regular rhythm.  Neurologic: Alert and oriented; speech intact; face symmetrical; moves all extremities well; CNII-XII intact without focal deficit  Assessment:  1. Atypical chest pain   2. HYPERTENSION, BENIGN ESSENTIAL     Plan:  1. Update CXR- no acute changes;  update CXR and abdoominal ultrasound; follow-up to be determined; 2. Uncontrolled- patient has not been taking medications as prescribed; re-iterated that she is to be on Micardis 40 mg daily, Bystolic- will change to 5 mg daily as she cannot get the 2.5 from her pharmacy; she is given samples of Bystolic 5 mg and Spironolactone 25 mg; she should see her PCP in 2 weeks for re-check.   Return in about 2 weeks (around 09/25/2017) for dr. Ronnald Ramp- blood pressure follow-up.  Orders Placed This Encounter  Procedures  . DG Chest 2 View    Standing Status:   Future    Number of Occurrences:   1    Standing Expiration Date:   11/12/2018    Order Specific Question:   Reason for Exam (SYMPTOM  OR DIAGNOSIS REQUIRED)    Answer:   atypical chest pain    Order Specific Question:   Preferred imaging location?    Answer:   [facility]    Order Specific Question:   Radiology Contrast Protocol - do NOT remove file path    Answer:   [email or web address]  . US Abdomen Complete    Standing Status:   Future    Standing Expiration Date:   11/12/2018    Order Specific Question:   Reason for Exam (SYMPTOM  OR DIAGNOSIS  REQUIRED)    Answer:   atypical chest pain    Order Specific Question:   Preferred imaging location?    Answer:   [facility]  . EKG 12-Lead    Requested Prescriptions   Signed Prescriptions Disp Refills  . telmisartan (MICARDIS) 40 MG tablet 90 tablet 0    Sig: Take 1 tablet (40 mg total) by mouth daily.  Marland Kitchen acetaminophen ([facility]S PAIN RELIEF EXTRA STRENGTH) 500 MG tablet 60 tablet 0    Sig: Take 1 tablet (500 mg total) by mouth every 8 (eight) hours as needed for moderate pain.

## 2017-09-16 ENCOUNTER — Ambulatory Visit
Admission: RE | Admit: 2017-09-16 | Discharge: 2017-09-16 | Disposition: A | Discharge status: Home / Self Care | Payer: Medicare Other | Source: Ambulatory Visit | Attending: Internal Medicine | Admitting: Internal Medicine

## 2017-09-16 DIAGNOSIS — Z1231 Encounter for screening mammogram for malignant neoplasm of breast: Secondary | ICD-10-CM

## 2017-09-16 LAB — HM MAMMOGRAPHY

## 2017-09-17 ENCOUNTER — Other Ambulatory Visit: Payer: Self-pay | Admitting: Internal Medicine

## 2017-09-17 ENCOUNTER — Other Ambulatory Visit (INDEPENDENT_AMBULATORY_CARE_PROVIDER_SITE_OTHER): Payer: Medicare Other

## 2017-09-17 ENCOUNTER — Encounter: Payer: Self-pay | Admitting: Gastroenterology

## 2017-09-17 ENCOUNTER — Telehealth: Payer: Self-pay | Admitting: Internal Medicine

## 2017-09-17 ENCOUNTER — Ambulatory Visit: Payer: Medicare Other | Admitting: Gastroenterology

## 2017-09-17 VITALS — BP 160/94 | HR 64 | Ht 61.5 in | Wt 221.2 lb

## 2017-09-17 DIAGNOSIS — K5080 Crohn's disease of both small and large intestine without complications: Secondary | ICD-10-CM | POA: Diagnosis not present

## 2017-09-17 DIAGNOSIS — I10 Essential (primary) hypertension: Secondary | ICD-10-CM

## 2017-09-17 DIAGNOSIS — E876 Hypokalemia: Secondary | ICD-10-CM

## 2017-09-17 DIAGNOSIS — T502X5A Adverse effect of carbonic-anhydrase inhibitors, benzothiadiazides and other diuretics, initial encounter: Secondary | ICD-10-CM

## 2017-09-17 DIAGNOSIS — R197 Diarrhea, unspecified: Secondary | ICD-10-CM | POA: Diagnosis not present

## 2017-09-17 LAB — CBC WITH DIFFERENTIAL/PLATELET
Basophils Absolute: 0 10*3/uL (ref 0.0–0.1)
Basophils Relative: 0.4 % (ref 0.0–3.0)
EOS PCT: 2.1 % (ref 0.0–5.0)
Eosinophils Absolute: 0.2 10*3/uL (ref 0.0–0.7)
HEMATOCRIT: 43.2 % (ref 36.0–46.0)
Hemoglobin: 14.5 g/dL (ref 12.0–15.0)
LYMPHS ABS: 3 10*3/uL (ref 0.7–4.0)
Lymphocytes Relative: 30.1 % (ref 12.0–46.0)
MCHC: 33.6 g/dL (ref 30.0–36.0)
MCV: 93.4 fl (ref 78.0–100.0)
MONOS PCT: 7.3 % (ref 3.0–12.0)
Monocytes Absolute: 0.7 10*3/uL (ref 0.1–1.0)
NEUTROS PCT: 60.1 % (ref 43.0–77.0)
Neutro Abs: 6 10*3/uL (ref 1.4–7.7)
Platelets: 296 10*3/uL (ref 150.0–400.0)
RBC: 4.63 Mil/uL (ref 3.87–5.11)
RDW: 14.6 % (ref 11.5–15.5)
WBC: 10 10*3/uL (ref 4.0–10.5)

## 2017-09-17 LAB — VITAMIN B12: VITAMIN B 12: 102 pg/mL — AB (ref 211–911)

## 2017-09-17 LAB — SEDIMENTATION RATE: Sed Rate: 16 mm/hr (ref 0–30)

## 2017-09-17 LAB — TSH: TSH: 1.51 u[IU]/mL (ref 0.35–4.50)

## 2017-09-17 LAB — C-REACTIVE PROTEIN: CRP: 0.2 mg/dL — ABNORMAL LOW (ref 0.5–20.0)

## 2017-09-17 MED ORDER — NEBIVOLOL HCL 5 MG PO TABS: 5.0000 mg | ORAL_TABLET | Freq: Every day | ORAL | 0 refills | Status: DC

## 2017-09-17 NOTE — Patient Instructions (Signed)
Your provider has requested that you go to the basement level for lab work before leaving today. Press "B" on the elevator. The lab is located at the first door on the left as you exit the elevator.  It has been recommended to you by your physician that you have a(n) Colonoscopy completed. Per your request, we did not schedule the procedure(s) today. Please contact our office at [phone] when you decide to have the procedure completed.  You can take over the counter Imodium twice daily for diarrhea symptoms.   Normal BMI (Body Mass Index- based on height and weight) is between 19 and 25. Your BMI today is Body mass index is 41.13 kg/m. Marland Kitchen Please consider follow up  regarding your BMI with your Primary Care Provider.  Thank you for choosing me and [facility].  Pricilla Riffle. Dagoberto Ligas., MD., [facility] Regal

## 2017-09-17 NOTE — Progress Notes (Signed)
History of Present Illness: This is a 62 year old female referred by Janith Lima, MD for the evaluation of Crohn's ileocolitis.  She has extensive GI records that I reviewed. She relates that she was diagnosed with Crohn's disease at age 41 and small intestine surgery was performed at the time.  She was followed for many years by Dr. Collene Mares and then switched to Dr. Benson Norway for about 10 years. She is S/P right colon/TI resection and LOA in 11/2014.  She has a history of azathioprine induced pancreatitis.  She declined Dr. Ulyses Amor recommendations on medications following surgery since she felt well.  She particularly wanted to avoid any injectable medications.  When her symptoms returned and she required prednisone for a flare she declined recommend biologic therapy.  She has been treated with short courses of prednisone for her most recent flares.  Last GI follow up with Dr. Benson Norway in 08/2016.  Patient states she no longer sees Dr. Benson Norway as there was a disagreement about obtaining a second opinion.  Currently she is not taking any IBD medications. Colonoscopy 11/2009 by Dr. Benson Norway showed ICV scarring and hemorrhoids.  She relates postprandial diarrhea occurring 4-5 times per day and this pattern has been constant since her surgery in 2016.  No other gastrointestinal complaints. Denies weight loss, abdominal pain, constipation, change in stool caliber, melena, hematochezia, nausea, vomiting, dysphagia, reflux symptoms, chest pain.  11/2014  Colon, segmental resection, small bowel right colon - CHRONIC ACTIVE ENTERITIS WITH STRICTURE, CONSISTENT WITH CROHN'S DISEASE. - FIBROUS ADHESIONS AND SEROSITIS. - RESECTION MARGINS ARE VIABLE. - NO DYSPLASIA OR MALIGNANCY.  Allergies  Allergen Reactions  . Azathioprine Other (See Comments)    Side effects to pancrease     . Crestor [Rosuvastatin Calcium] Other (See Comments)    Muscle and joint aches  . Lipitor [Atorvastatin Calcium] Other (See Comments)    Muscle  cramps  . Lisinopril Cough   Outpatient Medications Prior to Visit  Medication Sig Dispense Refill  . acetaminophen ([facility]S PAIN RELIEF EXTRA STRENGTH) 500 MG tablet Take 1 tablet (500 mg total) by mouth every 8 (eight) hours as needed for moderate pain. 60 tablet 0  . albuterol (PROVENTIL HFA;VENTOLIN HFA) 108 (90 Base) MCG/ACT inhaler Inhale 2 puffs into the lungs every 6 (six) hours as needed for wheezing or shortness of breath. 1 Inhaler 5  . buPROPion (WELLBUTRIN XL) 150 MG 24 hr tablet TAKE 1 TABLET BY MOUTH ONCE A DAY IN EARLY AM 90 tablet 1  . Cholecalciferol 2000 units TABS Take 1 tablet (2,000 Units total) by mouth daily. 90 tablet 1  . clonazePAM (KLONOPIN) 0.5 MG tablet     . fluticasone (FLONASE) 50 MCG/ACT nasal spray USE 2 SPRAYS INTO BOTH  NOSTRILS DAILY AS NEEDED  FOR ALLERGIES OR RHINITIS. 32 g 3  . Indacaterol-Glycopyrrolate (UTIBRON NEOHALER) 27.5-15.6 MCG CAPS Place 1 Act into inhaler and inhale 2 (two) times daily. 60 capsule 11  . lamoTRIgine (LAMICTAL) 200 MG tablet Take 1 tablet (200 mg total) by mouth at bedtime. 90 tablet 1  . nebivolol (BYSTOLIC) 2.5 MG tablet Take 1 tablet (2.5 mg total) by mouth daily. 90 tablet 1  . oxyCODONE-acetaminophen (PERCOCET/ROXICET) 5-325 MG tablet Take 1 tablet by mouth every 8 (eight) hours as needed for severe pain. 75 tablet 0  . potassium chloride SA (K-DUR,KLOR-CON) 20 MEQ tablet Take 1 tablet (20 mEq total) by mouth 2 (two) times daily. 60 tablet 2  . spironolactone (ALDACTONE) 25 MG tablet  Take 1 tablet (25 mg total) by mouth daily. 90 tablet 1  . telmisartan (MICARDIS) 40 MG tablet Take 1 tablet (40 mg total) by mouth daily. 90 tablet 0   No facility-administered medications prior to visit.    Past Medical History:  Diagnosis Date  . Allergy   . Benign breast lumps   . COPD (chronic obstructive pulmonary disease) ([place])   . Crohn disease ([place])   . Depression   . Diabetes mellitus type II    borderline, md checks hemaglobin A 1  c at md office   . Hypertension   . Sleep apnea    does not use cpap since weight loss 1 and 1/2 years ago  . Small bowel obstruction ([place])   . 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 Vitamin D deficiency    Past Surgical History:  Procedure Laterality Date  . ABDOMINAL HYSTERECTOMY     1 ovary removed  . APPENDECTOMY    . APPLICATION OF WOUND VAC  11/24/2014   Procedure: APPLICATION OF WOUND VAC;  Surgeon: Donnie Mesa, MD;  [facility];  Service: General;;  . BOWEL RESECTION N/A 11/24/2014   Procedure: SMALL BOWEL RESECTION;  Surgeon: Donnie Mesa, MD;  [facility];  Service: General;  Laterality: N/A;  . BREAST EXCISIONAL BIOPSY Right 1972  . BREAST SURGERY Right 40 yrs ago   benign tumor removed  . LAPAROTOMY N/A 11/24/2014   Procedure: EXPLORATORY LAPAROTOMY EXTENSIVE LYSIS OF ADHESIONS SAMLL BOWEL RESECTION RIGHT HEMI COLECTOMY WOUND VAC APPLICATION. ;  Surgeon: Donnie Mesa, MD;  [facility];  Service: General;  Laterality: N/A;  . LYSIS OF ADHESION  11/24/2014   Procedure: LYSIS OF ADHESION (3 HRS);  Surgeon: Donnie Mesa, MD;  [facility];  Service: General;;  . PARTIAL COLECTOMY  11/24/2014   Procedure: RIGHT HEMI COLECTOMY;  Surgeon: Donnie Mesa, MD;  [facility];  Service: General;;  . SMALL INTESTINE SURGERY  age 34   Social History   Socioeconomic History  . Marital status: Divorced    Spouse name: Not on file  . Number of children: 2  . Years of education: Not on file  . Highest education level: Not on file  Occupational History  . Not on file  Social Needs  . Financial resource strain: Not on file  . Food insecurity:    Worry: Not on file    Inability: Not on file  . Transportation needs:    Medical: Not on file    Non-medical: Not on file  Tobacco Use  . Smoking status: Current Every Day Smoker    Packs/day: 1.00    Years: 35.00    Pack years: 35.00    Types: Cigarettes  . Smokeless tobacco: Never Used    Substance and Sexual Activity  . Alcohol use: No  . Drug use: No  . Sexual activity: Not Currently  Lifestyle  . Physical activity:    Days per week: Not on file    Minutes per session: Not on file  . Stress: Not on file  Relationships  . Social connections:    Talks on phone: Not on file    Gets together: Not on file    Attends religious service: Not on file    Active member of club or organization: Not on file    Attends meetings of clubs or organizations: Not on file    Relationship status: Not on file  Other Topics Concern  . Not  on file  Social History Narrative  . Not on file   Family History  Problem Relation Age of Onset  . Hypertension Mother   . Ulcerative colitis Mother   . Crohn's disease Mother   . Heart disease Mother   . Kidney disease Mother   . Arthritis Father   . Brain cancer Father   . Alcohol abuse Paternal Uncle   . Diabetes Paternal Grandmother   . Stroke Paternal Grandmother   . Heart failure Sister   . Kidney disease Sister   . Heart failure Brother   . Other Sister        prediabetes       Review of Systems: Pertinent positive and negative review of systems were noted in the above HPI section. All other review of systems were otherwise negative.    Physical Exam: General: Well developed, well nourished, no acute distress Head: Normocephalic and atraumatic Eyes:  sclerae anicteric, EOMI Ears: Normal auditory acuity Mouth: No deformity or lesions Neck: Supple, no masses or thyromegaly Lungs: Clear throughout to auscultation Heart: Regular rate and rhythm; no murmurs, rubs or bruits Abdomen: Soft, non tender and non distended. No masses, hepatosplenomegaly or hernias noted. Normal Bowel sounds Rectal: Deferred to colonoscopy Musculoskeletal: Symmetrical with no gross deformities  Skin: No lesions on visible extremities Pulses:  Normal pulses noted Extremities: No clubbing, cyanosis, edema or deformities noted Neurological: Alert  oriented x 4, grossly nonfocal Cervical Nodes:  No significant cervical adenopathy Inguinal Nodes: No significant inguinal adenopathy Psychological:  Alert and cooperative. Normal mood and affect  Assessment and Recommendations:  1. Crohn's ileocolitis.  Status post TI and right colon resection in 11/2014.  History of small bowel surgery at age 24 when Crohn's was diagnosed.  Patient is very reluctant to consider medications typically recommended following surgical resections for Crohn's because she currently feels well.  I clearly outlined that with 2 prior surgeries she is at a higher risk for Crohn's flares, Crohn's complications and additional surgeries.  We discussed that it is advisable, if possible, to avoid ongoing and/or frequent intermittent use of corticosteroids.  She is agreeable to reassess her Crohn's with blood work and colonoscopy.  Obtain CBC, B12, TSH, ESR, CRP today.  Schedule colonoscopy. The risks (including bleeding, perforation, infection, missed lesions, medication reactions and possible hospitalization or surgery if complications occur), benefits, and alternatives to colonoscopy with possible biopsy and possible polypectomy were discussed with the patient and they consent to proceed.  She is advised to quit cigarette smoking and to seek assistance from her PCP.  We will rediscuss optimal management of Crohn's disease when her blood work and colonoscopy have been completed.   2. Diarrhea.  Likely related to prior surgery or Crohn's activity.  Trial of Imodium twice daily as needed while awaiting the results of her evaluation.   cc: Janith Lima, MD [address] [place], [place] [address]

## 2017-09-17 NOTE — Telephone Encounter (Signed)
Pt came in and states her BP will not go down, she saw GI, she is currently on 3 BP meds and wants to know what she needs to do now since it will not go down.  Please advise and call back

## 2017-09-17 NOTE — Telephone Encounter (Signed)
Pt informed new rx was sent and pt stated understanding.

## 2017-09-17 NOTE — Telephone Encounter (Signed)
Increase the dose of bystolic RX sent  Come back in to the lab for additional testing

## 2017-09-17 NOTE — Telephone Encounter (Signed)
Contacted pt - pt was seen by Dr. Fuller Plan today and BP was elevated at 160/94. Pt states that she is taking the bystolic (5 mg - recent increase by Jodi Mourning NP), telmisartan (40mg ) and spironolactone (25mg ).   I offered pt to come in but she is not sure that she will be able to get a ride.

## 2017-09-19 ENCOUNTER — Telehealth: Payer: Self-pay

## 2017-09-19 ENCOUNTER — Ambulatory Visit (AMBULATORY_SURGERY_CENTER): Payer: Self-pay

## 2017-09-19 VITALS — Ht 61.0 in | Wt 221.8 lb

## 2017-09-19 DIAGNOSIS — K5010 Crohn's disease of large intestine without complications: Secondary | ICD-10-CM

## 2017-09-19 MED ORDER — PEG 3350-KCL-NA BICARB-NACL 420 G PO SOLR: 4000.0000 mL | Freq: Once | ORAL | 0 refills | Status: AC

## 2017-09-19 NOTE — Progress Notes (Signed)
Per pt, no allergies to soy or egg products.Pt not taking any weight loss meds or using  O2 at home.  Pt refused emmi video. 

## 2017-09-19 NOTE — Telephone Encounter (Signed)
(  Routing to [place] as an [place])  Spoke with patient today. She has colonoscopy scheduled for 09/25/17. In the meantime she has asked that you cancel order for her abdominal ultrasound. She wants to have colonoscopy done first then follow up with Dr. Ronnald Ramp first and speak with him before proceeding with ultrasound.

## 2017-09-20 ENCOUNTER — Telehealth: Payer: Self-pay

## 2017-09-20 ENCOUNTER — Encounter: Payer: Self-pay | Admitting: Internal Medicine

## 2017-09-20 NOTE — Telephone Encounter (Signed)
I don't have anyway to cancel the order in Epic. She can [facility] and cancel. Please follow-up with Dr. Ronnald Ramp after the colonoscopy.

## 2017-09-20 NOTE — Telephone Encounter (Signed)
Spoke with patient and info given 

## 2017-09-20 NOTE — Telephone Encounter (Signed)
Please advise 

## 2017-09-20 NOTE — Telephone Encounter (Signed)
Spoke with patient and gave her the lab results and the recommendation she discuss B-12 replacement with her PCP.

## 2017-09-24 ENCOUNTER — Telehealth: Payer: Self-pay | Admitting: Gastroenterology

## 2017-09-24 NOTE — Telephone Encounter (Signed)
Spoke with patient - all questions answered

## 2017-09-25 ENCOUNTER — Other Ambulatory Visit: Payer: Self-pay

## 2017-09-25 ENCOUNTER — Encounter: Payer: Self-pay | Admitting: Gastroenterology

## 2017-09-25 ENCOUNTER — Ambulatory Visit (AMBULATORY_SURGERY_CENTER): Payer: Medicare Other | Admitting: Gastroenterology

## 2017-09-25 VITALS — BP 177/94 | HR 65 | Temp 97.5°F | Resp 18

## 2017-09-25 DIAGNOSIS — R197 Diarrhea, unspecified: Secondary | ICD-10-CM

## 2017-09-25 DIAGNOSIS — K50818 Crohn's disease of both small and large intestine with other complication: Secondary | ICD-10-CM

## 2017-09-25 DIAGNOSIS — K5090 Crohn's disease, unspecified, without complications: Secondary | ICD-10-CM | POA: Diagnosis not present

## 2017-09-25 DIAGNOSIS — K633 Ulcer of intestine: Secondary | ICD-10-CM | POA: Diagnosis not present

## 2017-09-25 DIAGNOSIS — K50018 Crohn's disease of small intestine with other complication: Secondary | ICD-10-CM | POA: Diagnosis not present

## 2017-09-25 MED ORDER — SODIUM CHLORIDE 0.9 % IV SOLN: 500.0000 mL | Freq: Once | INTRAVENOUS | Status: DC

## 2017-09-25 NOTE — Progress Notes (Signed)
Pt's states no medical or surgical changes since previsit or office visit.  No egg or soy allergy  

## 2017-09-25 NOTE — Progress Notes (Signed)
Report to PACU, RN, vss, BBS= Clear.  

## 2017-09-25 NOTE — Progress Notes (Signed)
Called to room to assist during endoscopic procedure.  Patient ID and intended procedure confirmed with present staff. Received instructions for my participation in the procedure from the performing physician.  

## 2017-09-25 NOTE — Patient Instructions (Signed)
YOU HAD AN ENDOSCOPIC PROCEDURE TODAY AT THE [facility]:   Refer to the procedure report that was given to you for any specific questions about what was found during the examination.  If the procedure report does not answer your questions, please call your gastroenterologist to clarify.  If you requested that your care partner not be given the details of your procedure findings, then the procedure report has been included in a sealed envelope for you to review at your convenience later.  YOU SHOULD EXPECT: Some feelings of bloating in the abdomen. Passage of more gas than usual.  Walking can help get rid of the air that was put into your GI tract during the procedure and reduce the bloating. If you had a lower endoscopy (such as a colonoscopy or flexible sigmoidoscopy) you may notice spotting of blood in your stool or on the toilet paper. If you underwent a bowel prep for your procedure, you may not have a normal bowel movement for a few days.  Please Note:  You might notice some irritation and congestion in your nose or some drainage.  This is from the oxygen used during your procedure.  There is no need for concern and it should clear up in a day or so.  SYMPTOMS TO REPORT IMMEDIATELY:   Following lower endoscopy (colonoscopy or flexible sigmoidoscopy):  Excessive amounts of blood in the stool  Significant tenderness or worsening of abdominal pains  Swelling of the abdomen that is new, acute  Fever of 100F or higher  For urgent or emergent issues, a gastroenterologist can be reached at any hour by calling [phone].   DIET:  We do recommend a small meal at first, but then you may proceed to your regular diet.  Drink plenty of fluids but you should avoid alcoholic beverages for 24 hours.  ACTIVITY:  You should plan to take it easy for the rest of today and you should NOT DRIVE or use heavy machinery until tomorrow (because of the sedation medicines used during the test).     FOLLOW UP: Our staff will call the number listed on your records the next business day following your procedure to check on you and address any questions or concerns that you may have regarding the information given to you following your procedure. If we do not reach you, we will leave a message.  However, if you are feeling well and you are not experiencing any problems, there is no need to return our call.  We will assume that you have returned to your regular daily activities without incident.  If any biopsies were taken you will be contacted by phone or by letter within the next 1-3 weeks.  Please call us at [phone] if you have not heard about the biopsies in 3 weeks.    SIGNATURES/CONFIDENTIALITY: You and/or your care partner have signed paperwork which will be entered into your electronic medical record.  These signatures attest to the fact that that the information above on your After Visit Summary has been reviewed and is understood.  Full responsibility of the confidentiality of this discharge information lies with you and/or your care-partner. 

## 2017-09-25 NOTE — Op Note (Signed)
[facility] Patient Name: April May Procedure Date: 09/25/2017 3:02 PM MRN: [id number] Endoscopist: Ladene Artist , MD Age: 62 Referring MD:  Date of Birth: Jun 06, 1955 Gender: Female Account #: [id number] Procedure:                Colonoscopy Indications:              Crohn's disease of the small bowel and colon.                            Diarrrhea. Medicines:                Monitored Anesthesia Care Procedure:                Pre-Anesthesia Assessment:                           - Prior to the procedure, a History and Physical                            was performed, and patient medications and                            allergies were reviewed. The patient's tolerance of                            previous anesthesia was also reviewed. The risks                            and benefits of the procedure and the sedation                            options and risks were discussed with the patient.                            All questions were answered, and informed consent                            was obtained. Prior Anticoagulants: The patient has                            taken no previous anticoagulant or antiplatelet                            agents. ASA Grade Assessment: III - A patient with                            severe systemic disease. After reviewing the risks                            and benefits, the patient was deemed in                            satisfactory condition to undergo the procedure.  After obtaining informed consent, the colonoscope                            was passed under direct vision. Throughout the                            procedure, the patient's blood pressure, pulse, and                            oxygen saturations were monitored continuously. The                            Colonoscope was introduced through the anus and                            advanced to the the terminal ileum. The  terminal                            ileum and the rectum were photographed. The quality                            of the bowel preparation was adequate. The                            colonoscopy was performed without difficulty. The                            patient tolerated the procedure well. Scope In: 3:31:39 PM Scope Out: 3:45:41 PM Scope Withdrawal Time: 0 hours 11 minutes 13 seconds  Total Procedure Duration: 0 hours 14 minutes 2 seconds  Findings:                 The perianal and digital rectal examinations were                            normal.                           The neo-terminal ileum contained a few localized                            non-bleeding erosions. No stigmata of recent                            bleeding were seen. Biopsies were taken with a cold                            forceps for histology.                           There was evidence of a prior end-to-side                            ileo-colonic anastomosis in the ascending colon.  This was patent and was characterized by ileal                            erosions. The anastomosis was not traversed.                           The exam was otherwise without abnormality on                            direct and retroflexion views. Random colon                            biopsies obtained. Complications:            No immediate complications. Estimated blood loss:                            None. Estimated Blood Loss:     Estimated blood loss: none. Impression:               - A few erosions in the neo-terminal ileum.                            Biopsied.                           - Patent end-to-side ileo-colonic anastomosis,                            characterized by erosion.                           - The examination was otherwise normal on direct                            and retroflexion views. Recommendation:           - Repeat colonoscopy in 5 years for surveillance.                            - Patient has a contact number available for                            emergencies. The signs and symptoms of potential                            delayed complications were discussed with the                            patient. Return to normal activities tomorrow.                            Written discharge instructions were provided to the                            patient.                           -  Resume previous diet.                           - Continue present medications.                           - Await pathology results. Ladene Artist, MD 09/25/2017 3:55:12 PM This report has been signed electronically.

## 2017-09-26 ENCOUNTER — Telehealth: Payer: Self-pay

## 2017-09-26 NOTE — Telephone Encounter (Signed)
  Follow up Call-  Call April May number 09/25/2017  Post procedure Call April May phone  # [phone]  Permission to leave phone message Yes  Some recent data might be hidden     Patient questions:  Do you have a fever, pain , or abdominal swelling? No. Pain Score  0 *  Have you tolerated food without any problems? Yes.    Have you been able to return to your normal activities? Yes.    Do you have any questions about your discharge instructions: Diet   No. Medications  No. Follow up visit  No.  Do you have questions or concerns about your Care? No.  Actions: * If pain score is 4 or above: No action needed, pain <4.

## 2017-09-28 LAB — HM COLONOSCOPY

## 2017-09-30 ENCOUNTER — Telehealth: Payer: Self-pay | Admitting: Internal Medicine

## 2017-09-30 NOTE — Telephone Encounter (Signed)
Copied from [place] [id number]. Topic: Quick Communication - See Telephone Encounter >> Sep 30, 2017 12:59 PM Bea Graff, NT wrote: CRM for notification. See Telephone encounter for: 09/30/17. Pt states that she had been messaging with Colletta [place] on MyChart a few weeks ago and that she has been waiting for over a week to see what Dr. Ronnald Ramp plan is for her low B12 and she states she is still having high blood pressure that has been going on since January. Please advise.

## 2017-10-01 NOTE — Telephone Encounter (Signed)
Pt contacted and asked her to come in on 10/02/2017 at 10:45am. She is concerned with her B12 and other vitamin levels and how they are affecting her BP.

## 2017-10-02 ENCOUNTER — Other Ambulatory Visit (INDEPENDENT_AMBULATORY_CARE_PROVIDER_SITE_OTHER): Payer: Medicare Other

## 2017-10-02 ENCOUNTER — Ambulatory Visit (INDEPENDENT_AMBULATORY_CARE_PROVIDER_SITE_OTHER): Payer: Medicare Other | Admitting: Internal Medicine

## 2017-10-02 ENCOUNTER — Encounter: Payer: Self-pay | Admitting: Internal Medicine

## 2017-10-02 VITALS — BP 140/90 | HR 70 | Temp 98.3°F | Ht 61.0 in | Wt 221.0 lb

## 2017-10-02 DIAGNOSIS — E876 Hypokalemia: Secondary | ICD-10-CM

## 2017-10-02 DIAGNOSIS — R0609 Other forms of dyspnea: Secondary | ICD-10-CM | POA: Diagnosis not present

## 2017-10-02 DIAGNOSIS — R7303 Prediabetes: Secondary | ICD-10-CM

## 2017-10-02 DIAGNOSIS — I10 Essential (primary) hypertension: Secondary | ICD-10-CM

## 2017-10-02 DIAGNOSIS — T502X5A Adverse effect of carbonic-anhydrase inhibitors, benzothiadiazides and other diuretics, initial encounter: Secondary | ICD-10-CM

## 2017-10-02 DIAGNOSIS — F411 Generalized anxiety disorder: Secondary | ICD-10-CM

## 2017-10-02 DIAGNOSIS — I351 Nonrheumatic aortic (valve) insufficiency: Secondary | ICD-10-CM | POA: Diagnosis not present

## 2017-10-02 DIAGNOSIS — E559 Vitamin D deficiency, unspecified: Secondary | ICD-10-CM

## 2017-10-02 DIAGNOSIS — R9431 Abnormal electrocardiogram [ECG] [EKG]: Secondary | ICD-10-CM | POA: Diagnosis not present

## 2017-10-02 DIAGNOSIS — E538 Deficiency of other specified B group vitamins: Secondary | ICD-10-CM | POA: Diagnosis not present

## 2017-10-02 DIAGNOSIS — R0600 Dyspnea, unspecified: Secondary | ICD-10-CM

## 2017-10-02 LAB — BASIC METABOLIC PANEL
BUN: 9 mg/dL (ref 6–23)
CHLORIDE: 106 meq/L (ref 96–112)
CO2: 29 mEq/L (ref 19–32)
Calcium: 9.9 mg/dL (ref 8.4–10.5)
Creatinine, Ser: 0.82 mg/dL (ref 0.40–1.20)
GFR: 90.87 mL/min (ref >60.00)
Glucose, Bld: 87 mg/dL (ref 70–99)
POTASSIUM: 4.1 meq/L (ref 3.5–5.1)
Sodium: 142 mEq/L (ref 135–145)

## 2017-10-02 LAB — HEMOGLOBIN A1C: HEMOGLOBIN A1C: 5.5 % (ref 4.6–6.5)

## 2017-10-02 MED ORDER — NEBIVOLOL HCL 10 MG PO TABS: 10.0000 mg | ORAL_TABLET | Freq: Every day | ORAL | 1 refills | Status: DC

## 2017-10-02 MED ORDER — CYANOCOBALAMIN 1000 MCG/ML IJ SOLN
1000.0000 ug | Freq: Once | INTRAMUSCULAR | Status: AC
Start: 2017-10-02 — End: 2017-10-02
  Administered 2017-10-02: 1000 ug via INTRAMUSCULAR

## 2017-10-02 MED ORDER — CLONAZEPAM 0.5 MG PO TABS: 0.5000 mg | ORAL_TABLET | Freq: Two times a day (BID) | ORAL | 0 refills | Status: DC | PRN

## 2017-10-02 NOTE — Patient Instructions (Signed)

## 2017-10-02 NOTE — Progress Notes (Signed)
Subjective:  Patient ID: April May, female    DOB: October 29, 1955  Age: 62 y.o. MRN: [id number]  CC: Hypertension   HPI [facility] presents for a blood pressure check.  She continues to complain of dyspnea on exertion but she denies any recent episodes of chest pain, diaphoresis, dizziness, lightheadedness, palpitations, or fatigue.  She had a mildly abnormal EKG about 3 weeks ago with nonspecific T wave inversion.  An echocardiogram was ordered about 2 years ago but was never completed.  She is happy to report that her blood pressure is improving on the current regimen but she has not achieved her goal of less than 140/90.  She has been working on her lifestyle modifications.  She continues to complain of anxiety and wants a refill of Klonopin.  Outpatient Medications Prior to Visit  Medication Sig Dispense Refill  . acetaminophen ([employer] PAIN RELIEF EXTRA STRENGTH) 500 MG tablet Take 1 tablet (500 mg total) by mouth every 8 (eight) hours as needed for moderate pain. 60 tablet 0  . albuterol (PROVENTIL HFA;VENTOLIN HFA) 108 (90 Base) MCG/ACT inhaler Inhale 2 puffs into the lungs every 6 (six) hours as needed for wheezing or shortness of breath. 1 Inhaler 5  . buPROPion (WELLBUTRIN XL) 150 MG 24 hr tablet TAKE 1 TABLET BY MOUTH ONCE A DAY IN EARLY AM 90 tablet 1  . Cholecalciferol 2000 units TABS Take 1 tablet (2,000 Units total) by mouth daily. 90 tablet 1  . fluticasone (FLONASE) 50 MCG/ACT nasal spray USE 2 SPRAYS INTO BOTH  NOSTRILS DAILY AS NEEDED  FOR ALLERGIES OR RHINITIS. 32 g 3  . Indacaterol-Glycopyrrolate (UTIBRON NEOHALER) 27.5-15.6 MCG CAPS Place 1 Act into inhaler and inhale 2 (two) times daily. (Patient taking differently: Place 1 Act into inhaler and inhale as needed. ) 60 capsule 11  . lamoTRIgine (LAMICTAL) 200 MG tablet Take 1 tablet (200 mg total) by mouth at bedtime. 90 tablet 1  . oxyCODONE-acetaminophen (PERCOCET/ROXICET) 5-325 MG tablet Take 1 tablet  by mouth every 8 (eight) hours as needed for severe pain. 75 tablet 0  . potassium chloride SA (K-DUR,KLOR-CON) 20 MEQ tablet Take 1 tablet (20 mEq total) by mouth 2 (two) times daily. 60 tablet 2  . spironolactone (ALDACTONE) 25 MG tablet Take 1 tablet (25 mg total) by mouth daily. 90 tablet 1  . telmisartan (MICARDIS) 40 MG tablet Take 1 tablet (40 mg total) by mouth daily. 90 tablet 0  . clonazePAM (KLONOPIN) 0.5 MG tablet as needed.     . nebivolol (BYSTOLIC) 5 MG tablet Take 1 tablet (5 mg total) by mouth daily. 90 tablet 0  . 0.9 %  sodium chloride infusion      No facility-administered medications prior to visit.     ROS Review of Systems  Constitutional: Negative for appetite change, chills, diaphoresis, fatigue and unexpected weight change.  HENT: Negative.   Eyes: Negative for visual disturbance.  Respiratory: Positive for shortness of breath (DOE). Negative for cough, chest tightness and wheezing.   Cardiovascular: Negative for chest pain, palpitations and leg swelling.  Gastrointestinal: Negative for abdominal pain, constipation, diarrhea, nausea and vomiting.  Endocrine: Negative.   Genitourinary: Negative.  Negative for difficulty urinating.  Musculoskeletal: Negative.  Negative for arthralgias, back pain, myalgias and neck pain.  Skin: Negative.  Negative for color change and pallor.  Neurological: Negative.  Negative for dizziness, weakness, light-headedness and numbness.  Hematological: Negative for adenopathy. Does not bruise/bleed easily.  Psychiatric/Behavioral: Negative for sleep disturbance  and suicidal ideas. The patient is nervous/anxious.     Objective:  BP 140/90 (BP Location: Left Arm, Patient Position: Sitting, Cuff Size: Large)   Pulse 70   Temp 98.3 F (36.8 C) (Oral)   Ht 5\' 1"  (1.549 m)   Wt 221 lb (100.2 kg)   LMP 05/14/1988   SpO2 99%   BMI 41.76 kg/m   BP Readings from Last 3 Encounters:  10/02/17 140/90  09/25/17 (!) 177/94  09/17/17 (!)  160/94    Wt Readings from Last 3 Encounters:  10/02/17 221 lb (100.2 kg)  09/19/17 221 lb 12.8 oz (100.6 kg)  09/17/17 221 lb 4 oz (100.4 kg)    Physical Exam  Constitutional: She is oriented to person, place, and time. No distress.  HENT:  Mouth/Throat: Oropharynx is clear and moist. No oropharyngeal exudate.  Eyes: Conjunctivae are normal. No scleral icterus.  Neck: Normal range of motion. Neck supple. No JVD present. No thyromegaly present.  Cardiovascular: Normal rate, regular rhythm and intact distal pulses. Exam reveals no gallop.  Murmur heard.  Decrescendo systolic murmur is present with a grade of 1/6.  No diastolic murmur is present. Pulmonary/Chest: Effort normal and breath sounds normal. No respiratory distress. She has no wheezes. She has no rales.  Abdominal: Soft. Bowel sounds are normal. She exhibits no distension and no mass.  Musculoskeletal: Normal range of motion. She exhibits no edema, tenderness or deformity.  Lymphadenopathy:    She has no cervical adenopathy.  Neurological: She is alert and oriented to person, place, and time.  Skin: Skin is warm and dry. She is not diaphoretic.  Vitals reviewed.   Lab Results  Component Value Date   WBC 10.0 09/17/2017   HGB 14.5 09/17/2017   HCT 43.2 09/17/2017   PLT 296.0 09/17/2017   GLUCOSE 87 10/02/2017   CHOL 125 01/31/2017   TRIG 129.0 01/31/2017   HDL 42.10 01/31/2017   LDLCALC 57 01/31/2017   ALT 15 09/18/2016   AST 14 09/18/2016   NA 142 10/02/2017   K 4.1 10/02/2017   CL 106 10/02/2017   CREATININE 0.82 10/02/2017   BUN 9 10/02/2017   CO2 29 10/02/2017   TSH 1.51 09/17/2017   HGBA1C 5.5 10/02/2017   MICROALBUR 10 01/31/2017    Mm Screening Breast Tomo Bilateral  Result Date: 09/16/2017 CLINICAL DATA:  Screening. EXAM: DIGITAL SCREENING BILATERAL MAMMOGRAM WITH TOMO AND CAD COMPARISON:  Previous exam(s). ACR Breast Density Category b: There are scattered areas of fibroglandular density.  FINDINGS: There are no findings suspicious for malignancy. Images were processed with CAD. IMPRESSION: No mammographic evidence of malignancy. A result letter of this screening mammogram will be mailed directly to the patient. RECOMMENDATION: Screening mammogram in one year. (Code:SM-B-01Y) BI-RADS CATEGORY  1: Negative. Electronically Signed   By: Abelardo Diesel M.D.   On: 09/16/2017 10:27    Assessment & Plan:   Tommy was seen today for hypertension.  Diagnoses and all orders for this visit:  HYPERTENSION, BENIGN ESSENTIAL- Her blood pressure has improved but she has not quite achieved her goal.  I will increase the nebivolol dose from 5 mg to 10 mg.  Screening for primary aldosteronism is negative.  Her electrolytes and renal function are normal. -     Aldosterone + renin activity w/ ratio; Future -     Basic metabolic panel; Future -     Discontinue: nebivolol (BYSTOLIC) 10 MG tablet; Take 1 tablet (10 mg total) by mouth daily. -  nebivolol (BYSTOLIC) 10 MG tablet; Take 1 tablet (10 mg total) by mouth daily.  Diuretic-induced hypokalemia- Her potassium level is 4.1 on the combination of a potassium sparing diuretic and a potassium supplement.  Will continue this combination. -     Aldosterone + renin activity w/ ratio; Future -     Basic metabolic panel; Future  GAD (generalized anxiety disorder) -     clonazePAM (KLONOPIN) 0.5 MG tablet; Take 1 tablet (0.5 mg total) by mouth 2 (two) times daily as needed.  Prediabetes-her A1c is down to 5.5%.  She was praised for her lifestyle modifications. -     Basic metabolic panel; Future -     Hemoglobin A1c; Future  Vitamin D deficiency  Vitamin B12 deficiency -     cyanocobalamin ((VITAMIN B-12)) injection 1,000 mcg  Abnormal electrocardiogram (ECG) (EKG) -     MYOCARDIAL PERFUSION IMAGING; Future -     ECHOCARDIOGRAM COMPLETE; Future  DOE (dyspnea on exertion)- She continues to complain of DOE and recently had an abnormal EKG.  I  have asked her to undergo screening for ischemia with a myocardial perfusion imaging and to check her ejection fraction with an echocardiogram. -     MYOCARDIAL PERFUSION IMAGING; Future -     ECHOCARDIOGRAM COMPLETE; Future  Abnormal electrocardiogram -     MYOCARDIAL PERFUSION IMAGING; Future  Aortic ejection murmur -     ECHOCARDIOGRAM COMPLETE; Future   I have changed Cassara W. Gahan's clonazePAM. I am also having her maintain her fluticasone, oxyCODONE-acetaminophen, lamoTRIgine, buPROPion, albuterol, spironolactone, potassium chloride SA, Cholecalciferol, Indacaterol-Glycopyrrolate, telmisartan, acetaminophen, and nebivolol. We will stop administering sodium chloride. Additionally, we administered cyanocobalamin.  Meds ordered this encounter  Medications  . clonazePAM (KLONOPIN) 0.5 MG tablet    Sig: Take 1 tablet (0.5 mg total) by mouth 2 (two) times daily as needed.    Dispense:  180 tablet    Refill:  0  . DISCONTD: nebivolol (BYSTOLIC) 10 MG tablet    Sig: Take 1 tablet (10 mg total) by mouth daily.    Dispense:  90 tablet    Refill:  1  . cyanocobalamin ((VITAMIN B-12)) injection 1,000 mcg  . nebivolol (BYSTOLIC) 10 MG tablet    Sig: Take 1 tablet (10 mg total) by mouth daily.    Dispense:  90 tablet    Refill:  1     Follow-up: Return in about 6 weeks (around 11/13/2017).  Scarlette Calico, MD

## 2017-10-06 ENCOUNTER — Encounter: Payer: Self-pay | Admitting: Gastroenterology

## 2017-10-06 LAB — ALDOSTERONE + RENIN ACTIVITY W/ RATIO
ALDOSTERONE: 3 ng/dL
Renin Activity: <0.04 ng/mL/h — ABNORMAL LOW (ref 0.25–5.82)

## 2017-10-08 ENCOUNTER — Telehealth: Payer: Self-pay | Admitting: Gastroenterology

## 2017-10-08 DIAGNOSIS — R9431 Abnormal electrocardiogram [ECG] [EKG]: Secondary | ICD-10-CM | POA: Insufficient documentation

## 2017-10-08 DIAGNOSIS — E538 Deficiency of other specified B group vitamins: Secondary | ICD-10-CM | POA: Insufficient documentation

## 2017-10-08 NOTE — Telephone Encounter (Signed)
Questions answered.  Follow up scheduled for 12/11/17 9:00

## 2017-10-08 NOTE — Telephone Encounter (Signed)
Patient requesting call from nurse to discuss results from colon on 5.15.19.

## 2017-10-10 ENCOUNTER — Ambulatory Visit (INDEPENDENT_AMBULATORY_CARE_PROVIDER_SITE_OTHER): Payer: Medicare Other | Admitting: Internal Medicine

## 2017-10-10 ENCOUNTER — Encounter: Payer: Self-pay | Admitting: Internal Medicine

## 2017-10-10 VITALS — BP 140/90 | HR 58 | Temp 98.2°F | Ht 61.0 in | Wt 220.8 lb

## 2017-10-10 DIAGNOSIS — R9431 Abnormal electrocardiogram [ECG] [EKG]: Secondary | ICD-10-CM

## 2017-10-10 DIAGNOSIS — I351 Nonrheumatic aortic (valve) insufficiency: Secondary | ICD-10-CM

## 2017-10-10 DIAGNOSIS — E538 Deficiency of other specified B group vitamins: Secondary | ICD-10-CM

## 2017-10-10 DIAGNOSIS — I10 Essential (primary) hypertension: Secondary | ICD-10-CM | POA: Diagnosis not present

## 2017-10-10 MED ORDER — CYANOCOBALAMIN 1000 MCG/ML IJ SOLN
1000.0000 ug | Freq: Once | INTRAMUSCULAR | Status: AC
  Administered 2017-10-10: 1000 ug via INTRAMUSCULAR

## 2017-10-10 NOTE — Progress Notes (Signed)
Subjective:  Patient ID: April May, female    DOB: 1956/04/01  Age: 63 y.o. MRN: [id number]  CC: Hypertension   HPI April May presents for f/up -she feels much better since I last saw her.  She attributes this to starting the B12 injection.  Her DOE has improved.  Her blood pressure has been well controlled and she denies any recent episodes of headache, blurred vision, chest pain, palpitations, edema, or fatigue.  Outpatient Medications Prior to Visit  Medication Sig Dispense Refill  . acetaminophen ([employer] PAIN RELIEF EXTRA STRENGTH) 500 MG tablet Take 1 tablet (500 mg total) by mouth every 8 (eight) hours as needed for moderate pain. 60 tablet 0  . albuterol (PROVENTIL HFA;VENTOLIN HFA) 108 (90 Base) MCG/ACT inhaler Inhale 2 puffs into the lungs every 6 (six) hours as needed for wheezing or shortness of breath. 1 Inhaler 5  . buPROPion (WELLBUTRIN XL) 150 MG 24 hr tablet TAKE 1 TABLET BY MOUTH ONCE A DAY IN EARLY AM 90 tablet 1  . Cholecalciferol 2000 units TABS Take 1 tablet (2,000 Units total) by mouth daily. 90 tablet 1  . clonazePAM (KLONOPIN) 0.5 MG tablet Take 1 tablet (0.5 mg total) by mouth 2 (two) times daily as needed. 180 tablet 0  . fluticasone (FLONASE) 50 MCG/ACT nasal spray USE 2 SPRAYS INTO BOTH  NOSTRILS DAILY AS NEEDED  FOR ALLERGIES OR RHINITIS. 32 g 3  . Indacaterol-Glycopyrrolate (UTIBRON NEOHALER) 27.5-15.6 MCG CAPS Place 1 Act into inhaler and inhale 2 (two) times daily. (Patient taking differently: Place 1 Act into inhaler and inhale as needed. ) 60 capsule 11  . lamoTRIgine (LAMICTAL) 200 MG tablet Take 1 tablet (200 mg total) by mouth at bedtime. 90 tablet 1  . nebivolol (BYSTOLIC) 10 MG tablet Take 1 tablet (10 mg total) by mouth daily. 90 tablet 1  . oxyCODONE-acetaminophen (PERCOCET/ROXICET) 5-325 MG tablet Take 1 tablet by mouth every 8 (eight) hours as needed for severe pain. 75 tablet 0  . potassium chloride SA (K-DUR,KLOR-CON) 20  MEQ tablet Take 1 tablet (20 mEq total) by mouth 2 (two) times daily. 60 tablet 2  . spironolactone (ALDACTONE) 25 MG tablet Take 1 tablet (25 mg total) by mouth daily. 90 tablet 1  . telmisartan (MICARDIS) 40 MG tablet Take 1 tablet (40 mg total) by mouth daily. 90 tablet 0   No facility-administered medications prior to visit.     ROS Review of Systems  Constitutional: Negative.  Negative for appetite change, diaphoresis, fatigue and unexpected weight change.  HENT: Negative.   Eyes: Negative for visual disturbance.  Respiratory: Positive for shortness of breath (mild DOE). Negative for cough, chest tightness and wheezing.   Cardiovascular: Negative for chest pain, palpitations and leg swelling.  Gastrointestinal: Negative.  Negative for abdominal pain, constipation, diarrhea, nausea and vomiting.  Endocrine: Negative.   Genitourinary: Negative.  Negative for difficulty urinating.  Musculoskeletal: Negative.  Negative for arthralgias, myalgias and neck pain.  Skin: Negative.  Negative for color change, pallor and rash.  Neurological: Negative.  Negative for dizziness, weakness, light-headedness, numbness and headaches.  Hematological: Negative for adenopathy. Does not bruise/bleed easily.  Psychiatric/Behavioral: Negative.     Objective:  BP 140/90 (BP Location: Left Arm, Patient Position: Sitting, Cuff Size: Large)   Pulse (!) 58   Temp 98.2 F (36.8 C) (Oral)   Ht 5\' 1"  (1.549 m)   Wt 220 lb 12 oz (100.1 kg)   LMP 05/14/1988   SpO2 97%  BMI 41.71 kg/m   BP Readings from Last 3 Encounters:  10/10/17 140/90  10/02/17 140/90  09/25/17 (!) 177/94    Wt Readings from Last 3 Encounters:  10/10/17 220 lb 12 oz (100.1 kg)  10/02/17 221 lb (100.2 kg)  09/19/17 221 lb 12.8 oz (100.6 kg)    Physical Exam  Constitutional: She is oriented to person, place, and time. No distress.  HENT:  Mouth/Throat: Oropharynx is clear and moist. No oropharyngeal exudate.  Eyes:  Conjunctivae are normal. Right eye exhibits no discharge. Left eye exhibits no discharge. No scleral icterus.  Neck: Normal range of motion. Neck supple. No JVD present. No thyromegaly present.  Cardiovascular: Normal rate and regular rhythm. Exam reveals no gallop.  Murmur heard. Pulmonary/Chest: Effort normal and breath sounds normal. No respiratory distress. She has no wheezes. She has no rales.  Abdominal: Soft. Bowel sounds are normal. She exhibits no mass. There is no hepatosplenomegaly. There is no tenderness.  Musculoskeletal: Normal range of motion. She exhibits no edema, tenderness or deformity.  Lymphadenopathy:    She has no cervical adenopathy.  Neurological: She is alert and oriented to person, place, and time.  Skin: Skin is warm and dry. She is not diaphoretic. No pallor.  Psychiatric: She has a normal mood and affect. Her behavior is normal. Judgment and thought content normal.  Vitals reviewed.   Lab Results  Component Value Date   WBC 10.0 09/17/2017   HGB 14.5 09/17/2017   HCT 43.2 09/17/2017   PLT 296.0 09/17/2017   GLUCOSE 87 10/02/2017   CHOL 125 01/31/2017   TRIG 129.0 01/31/2017   HDL 42.10 01/31/2017   LDLCALC 57 01/31/2017   ALT 15 09/18/2016   AST 14 09/18/2016   NA 142 10/02/2017   K 4.1 10/02/2017   CL 106 10/02/2017   CREATININE 0.82 10/02/2017   BUN 9 10/02/2017   CO2 29 10/02/2017   TSH 1.51 09/17/2017   HGBA1C 5.5 10/02/2017   MICROALBUR 10 01/31/2017    Mm Screening Breast Tomo Bilateral  Result Date: 09/16/2017 CLINICAL DATA:  Screening. EXAM: DIGITAL SCREENING BILATERAL MAMMOGRAM WITH TOMO AND CAD COMPARISON:  Previous exam(s). ACR Breast Density Category b: There are scattered areas of fibroglandular density. FINDINGS: There are no findings suspicious for malignancy. Images were processed with CAD. IMPRESSION: No mammographic evidence of malignancy. A result letter of this screening mammogram will be mailed directly to the patient.  RECOMMENDATION: Screening mammogram in one year. (Code:SM-B-01Y) BI-RADS CATEGORY  1: Negative. Electronically Signed   By: Abelardo Diesel M.D.   On: 09/16/2017 10:27    Assessment & Plan:   Rin was seen today for hypertension.  Diagnoses and all orders for this visit:  Vitamin B12 deficiency -     cyanocobalamin ((VITAMIN B-12)) injection 1,000 mcg  HYPERTENSION, BENIGN ESSENTIAL- Her blood pressure is adequately well controlled.  Aortic ejection murmur- She is scheduled for an echocardiogram regarding this.  Abnormal electrocardiogram (ECG) (EKG)- She is scheduled for a myocardial perfusion image to screen for ischemia.   I am having Budd Palmer maintain her fluticasone, oxyCODONE-acetaminophen, lamoTRIgine, buPROPion, albuterol, spironolactone, potassium chloride SA, Cholecalciferol, Indacaterol-Glycopyrrolate, telmisartan, acetaminophen, clonazePAM, and nebivolol. We administered cyanocobalamin.  Meds ordered this encounter  Medications  . cyanocobalamin ((VITAMIN B-12)) injection 1,000 mcg     Follow-up: No follow-ups on file.  Scarlette Calico, MD

## 2017-10-13 ENCOUNTER — Encounter: Payer: Self-pay | Admitting: Internal Medicine

## 2017-10-13 NOTE — Patient Instructions (Signed)

## 2017-10-16 ENCOUNTER — Encounter: Payer: Self-pay | Admitting: Internal Medicine

## 2017-10-16 ENCOUNTER — Ambulatory Visit (HOSPITAL_COMMUNITY): Payer: Medicare Other | Attending: Cardiovascular Disease

## 2017-10-16 ENCOUNTER — Other Ambulatory Visit: Payer: Self-pay

## 2017-10-16 DIAGNOSIS — R9431 Abnormal electrocardiogram [ECG] [EKG]: Secondary | ICD-10-CM | POA: Diagnosis not present

## 2017-10-16 DIAGNOSIS — R011 Cardiac murmur, unspecified: Secondary | ICD-10-CM | POA: Insufficient documentation

## 2017-10-16 DIAGNOSIS — R0600 Dyspnea, unspecified: Secondary | ICD-10-CM | POA: Diagnosis not present

## 2017-10-16 DIAGNOSIS — R0609 Other forms of dyspnea: Secondary | ICD-10-CM | POA: Diagnosis not present

## 2017-10-16 DIAGNOSIS — I351 Nonrheumatic aortic (valve) insufficiency: Secondary | ICD-10-CM | POA: Diagnosis not present

## 2017-10-18 ENCOUNTER — Ambulatory Visit (INDEPENDENT_AMBULATORY_CARE_PROVIDER_SITE_OTHER): Payer: Medicare Other

## 2017-10-18 DIAGNOSIS — E538 Deficiency of other specified B group vitamins: Secondary | ICD-10-CM | POA: Diagnosis not present

## 2017-10-18 MED ORDER — CYANOCOBALAMIN 1000 MCG/ML IJ SOLN
1000.0000 ug | Freq: Once | INTRAMUSCULAR | Status: AC
  Administered 2017-10-18: 1000 ug via INTRAMUSCULAR

## 2017-10-18 NOTE — Progress Notes (Addendum)
[facility] screening examination/treatment/procedure(s) were performed by non-physician practitioner and as supervising physician I was immediately available for consultation/collaboration. I agree with above. James John, MD   

## 2017-10-24 ENCOUNTER — Telehealth (HOSPITAL_COMMUNITY): Payer: Self-pay | Admitting: *Deleted

## 2017-10-24 NOTE — Telephone Encounter (Signed)
Patient given detailed instructions per Myocardial Perfusion Study Information Sheet for the test on 10/29/17. Patient notified to arrive 15 minutes early and that it is imperative to arrive on time for appointment to keep from having the test rescheduled.  If you need to cancel or reschedule your appointment, please call the office within 24 hours of your appointment. . Patient verbalized understanding. Kirstie Peri

## 2017-10-24 NOTE — Telephone Encounter (Signed)
Patient given detailed instructions per Myocardial Perfusion Study Information Sheet for the test on 10/28/17. Patient notified to arrive 15 minutes early and that it is imperative to arrive on time for appointment to keep from having the test rescheduled.  If you need to cancel or reschedule your appointment, please call the office within 24 hours of your appointment. . Patient verbalized understanding. Kirstie Peri

## 2017-10-29 ENCOUNTER — Ambulatory Visit (HOSPITAL_COMMUNITY): Payer: Medicare Other | Attending: Cardiology

## 2017-10-29 ENCOUNTER — Encounter: Payer: Self-pay | Admitting: Internal Medicine

## 2017-10-29 ENCOUNTER — Telehealth: Payer: Self-pay | Admitting: Emergency Medicine

## 2017-10-29 DIAGNOSIS — R0609 Other forms of dyspnea: Secondary | ICD-10-CM | POA: Insufficient documentation

## 2017-10-29 DIAGNOSIS — R9431 Abnormal electrocardiogram [ECG] [EKG]: Secondary | ICD-10-CM | POA: Insufficient documentation

## 2017-10-29 DIAGNOSIS — R0600 Dyspnea, unspecified: Secondary | ICD-10-CM

## 2017-10-29 LAB — MYOCARDIAL PERFUSION IMAGING
CHL CUP NUCLEAR SDS: 3
CHL CUP RESTING HR STRESS: 47 {beats}/min
CSEPPHR: 81 {beats}/min
LVDIAVOL: 102 mL (ref 46–106)
LVSYSVOL: 41 mL
RATE: 0.32
SRS: 0
SSS: 3
TID: 1.01

## 2017-10-29 MED ORDER — TECHNETIUM TC 99M TETROFOSMIN IV KIT
31.5000 | PACK | Freq: Once | INTRAVENOUS | Status: AC | PRN
  Administered 2017-10-29: 31.5 via INTRAVENOUS
  Filled 2017-10-29: qty 32

## 2017-10-29 MED ORDER — TECHNETIUM TC 99M TETROFOSMIN IV KIT
10.9000 | PACK | Freq: Once | INTRAVENOUS | Status: AC | PRN
  Administered 2017-10-29: 10.9 via INTRAVENOUS
  Filled 2017-10-29: qty 11

## 2017-10-29 MED ORDER — REGADENOSON 0.4 MG/5ML IV SOLN
0.4000 mg | Freq: Once | INTRAVENOUS | Status: AC
  Administered 2017-10-29: 0.4 mg via INTRAVENOUS

## 2017-10-29 NOTE — Telephone Encounter (Signed)
Called patient to schedule AWV. Patient will call back to schedule at later date.

## 2017-10-31 ENCOUNTER — Other Ambulatory Visit: Payer: Self-pay | Admitting: Internal Medicine

## 2017-10-31 ENCOUNTER — Other Ambulatory Visit: Payer: Self-pay | Admitting: Family

## 2017-10-31 DIAGNOSIS — F321 Major depressive disorder, single episode, moderate: Secondary | ICD-10-CM

## 2017-10-31 DIAGNOSIS — I10 Essential (primary) hypertension: Secondary | ICD-10-CM

## 2017-11-05 ENCOUNTER — Other Ambulatory Visit: Payer: Self-pay | Admitting: Internal Medicine

## 2017-11-05 ENCOUNTER — Other Ambulatory Visit: Payer: Self-pay | Admitting: Family

## 2017-11-05 DIAGNOSIS — F411 Generalized anxiety disorder: Secondary | ICD-10-CM

## 2017-11-05 DIAGNOSIS — I10 Essential (primary) hypertension: Secondary | ICD-10-CM

## 2017-11-05 NOTE — Telephone Encounter (Signed)
Pt should have a follow up appt for BP in the next month or so.

## 2017-11-05 NOTE — Telephone Encounter (Signed)
On AVS from May it says to follow up in "4 years". I assume he meant to 4 months from the May visit?

## 2017-11-08 NOTE — Telephone Encounter (Signed)
Copied from [place] [id number]. Topic: General - Other >> Nov 08, 2017 12:18 PM Marin Olp L wrote: Reason for CRM: Patient wants to know why Dr. Ronnald Ramp said her tests were normal but in mychart it says her EKG was abnormal? Patient also has a question about her bp. Woulkd like to speak with Jones or cma. She sent a message via mychart but has not heard back in a week.

## 2017-11-12 NOTE — Telephone Encounter (Signed)
Pt contacted and informed that the echocardiogram was normal and the abnormal that was mentioned was the dx needed for the echocardiogram.

## 2017-11-15 ENCOUNTER — Ambulatory Visit: Payer: Medicare Other

## 2017-11-19 ENCOUNTER — Encounter: Payer: Self-pay | Admitting: Internal Medicine

## 2017-11-19 ENCOUNTER — Ambulatory Visit (INDEPENDENT_AMBULATORY_CARE_PROVIDER_SITE_OTHER): Payer: Medicare Other | Admitting: Internal Medicine

## 2017-11-19 ENCOUNTER — Other Ambulatory Visit (INDEPENDENT_AMBULATORY_CARE_PROVIDER_SITE_OTHER): Payer: Medicare Other

## 2017-11-19 VITALS — BP 152/98 | HR 73 | Temp 97.9°F | Resp 16 | Wt 225.0 lb

## 2017-11-19 DIAGNOSIS — E538 Deficiency of other specified B group vitamins: Secondary | ICD-10-CM

## 2017-11-19 DIAGNOSIS — I10 Essential (primary) hypertension: Secondary | ICD-10-CM

## 2017-11-19 DIAGNOSIS — E559 Vitamin D deficiency, unspecified: Secondary | ICD-10-CM | POA: Diagnosis not present

## 2017-11-19 DIAGNOSIS — R519 Headache, unspecified: Secondary | ICD-10-CM | POA: Insufficient documentation

## 2017-11-19 DIAGNOSIS — R51 Headache: Secondary | ICD-10-CM | POA: Diagnosis not present

## 2017-11-19 LAB — BASIC METABOLIC PANEL
BUN: 11 mg/dL (ref 6–23)
CHLORIDE: 107 meq/L (ref 96–112)
CO2: 33 meq/L — AB (ref 19–32)
Calcium: 9.6 mg/dL (ref 8.4–10.5)
Creatinine, Ser: 0.79 mg/dL (ref 0.40–1.20)
GFR: 94.82 mL/min (ref >60.00)
GLUCOSE: 106 mg/dL — AB (ref 70–99)
POTASSIUM: 4.5 meq/L (ref 3.5–5.1)
SODIUM: 145 meq/L (ref 135–145)

## 2017-11-19 LAB — VITAMIN D 25 HYDROXY (VIT D DEFICIENCY, FRACTURES): VITD: 20.58 ng/mL — ABNORMAL LOW (ref 30.00–100.00)

## 2017-11-19 LAB — URINALYSIS, ROUTINE W REFLEX MICROSCOPIC
BILIRUBIN URINE: NEGATIVE
Hgb urine dipstick: NEGATIVE
KETONES UR: NEGATIVE
LEUKOCYTES UA: NEGATIVE
NITRITE: NEGATIVE
RBC / HPF: NONE SEEN (ref 0–?)
Specific Gravity, Urine: 1.02 (ref 1.000–1.030)
Total Protein, Urine: 30 — AB
URINE GLUCOSE: NEGATIVE
UROBILINOGEN UA: 0.2 (ref 0.0–1.0)
WBC UA: NONE SEEN (ref 0–?)
pH: 6 (ref 5.0–8.0)

## 2017-11-19 MED ORDER — CHOLECALCIFEROL 1.25 MG (50000 UT) PO CAPS: 50000.0000 [IU] | ORAL_CAPSULE | ORAL | 1 refills | Status: DC

## 2017-11-19 MED ORDER — AMLODIPINE BESYLATE 5 MG PO TABS: 5.0000 mg | ORAL_TABLET | Freq: Every day | ORAL | 1 refills | Status: DC

## 2017-11-19 MED ORDER — CYANOCOBALAMIN 1000 MCG/ML IJ SOLN
1000.0000 ug | Freq: Once | INTRAMUSCULAR | Status: AC
  Administered 2017-11-19: 1000 ug via INTRAMUSCULAR

## 2017-11-19 NOTE — Progress Notes (Signed)
Subjective:  Patient ID: April May, female    DOB: 1956/01/13  Age: 62 y.o. MRN: [id number]  CC: Hypertension and Headache   HPI April May presents for f/up - She continues to be concerned that her blood pressure is not adequately well controlled.  She also tells me the last month or 2 she has developed a persistent headache that she says is unusual for her.  She is never before had headaches.  She describes a diffuse stabbing and achy sensation on the top of her head with no changes in her vision or hearing.  She denies nausea, vomiting, or paresthesias.  Outpatient Medications Prior to Visit  Medication Sig Dispense Refill  . acetaminophen ([employer] PAIN RELIEF EXTRA STRENGTH) 500 MG tablet Take 1 tablet (500 mg total) by mouth every 8 (eight) hours as needed for moderate pain. 60 tablet 0  . albuterol (PROVENTIL HFA;VENTOLIN HFA) 108 (90 Base) MCG/ACT inhaler Inhale 2 puffs into the lungs every 6 (six) hours as needed for wheezing or shortness of breath. 1 Inhaler 5  . buPROPion (WELLBUTRIN XL) 150 MG 24 hr tablet TAKE 1 TABLET BY MOUTH ONCE A DAY EARLY IN THE MORNING 90 tablet 1  . clonazePAM (KLONOPIN) 0.5 MG tablet TAKE 1 TABLET BY MOUTH TWO  TIMES DAILY AS NEEDED 180 tablet 0  . fluticasone (FLONASE) 50 MCG/ACT nasal spray USE 2 SPRAYS INTO BOTH  NOSTRILS DAILY AS NEEDED  FOR ALLERGIES OR RHINITIS. 32 g 3  . Indacaterol-Glycopyrrolate (UTIBRON NEOHALER) 27.5-15.6 MCG CAPS Place 1 Act into inhaler and inhale 2 (two) times daily. (Patient taking differently: Place 1 Act into inhaler and inhale as needed. ) 60 capsule 11  . lamoTRIgine (LAMICTAL) 200 MG tablet TAKE 1 TABLET BY MOUTH AT  BEDTIME 90 tablet 1  . nebivolol (BYSTOLIC) 10 MG tablet Take 1 tablet (10 mg total) by mouth daily. 90 tablet 1  . oxyCODONE-acetaminophen (PERCOCET/ROXICET) 5-325 MG tablet Take 1 tablet by mouth every 8 (eight) hours as needed for severe pain. 75 tablet 0  . potassium chloride SA  (K-DUR,KLOR-CON) 20 MEQ tablet Take 1 tablet (20 mEq total) by mouth 2 (two) times daily. 60 tablet 2  . spironolactone (ALDACTONE) 25 MG tablet Take 1 tablet (25 mg total) by mouth daily. 90 tablet 1  . telmisartan (MICARDIS) 40 MG tablet Take 1 tablet (40 mg total) by mouth daily. 90 tablet 0  . Cholecalciferol 2000 units TABS Take 1 tablet (2,000 Units total) by mouth daily. 90 tablet 1   No facility-administered medications prior to visit.     ROS Review of Systems  Constitutional: Negative.  Negative for chills, diaphoresis, fatigue and fever.  HENT: Negative.  Negative for tinnitus and trouble swallowing.   Eyes: Negative for pain and visual disturbance.  Respiratory: Negative for cough, chest tightness, shortness of breath and wheezing.   Cardiovascular: Negative for chest pain, palpitations and leg swelling.  Gastrointestinal: Negative for abdominal pain, constipation, diarrhea, nausea and vomiting.  Endocrine: Negative.   Genitourinary: Negative.  Negative for difficulty urinating.  Musculoskeletal: Negative.  Negative for arthralgias, back pain, myalgias and neck pain.  Skin: Negative.   Neurological: Positive for headaches. Negative for dizziness, weakness, light-headedness and numbness.  Hematological: Negative for adenopathy. Does not bruise/bleed easily.  Psychiatric/Behavioral: Negative.     Objective:  BP (!) 152/98 (BP Location: Left Arm, Patient Position: Sitting, Cuff Size: Large)   Pulse 73   Temp 97.9 F (36.6 C) (Oral)   Resp 16  Wt 225 lb (102.1 kg)   LMP 05/14/1988   SpO2 97%   BMI 42.51 kg/m   BP Readings from Last 3 Encounters:  11/19/17 (!) 152/98  10/10/17 140/90  10/02/17 140/90    Wt Readings from Last 3 Encounters:  11/19/17 225 lb (102.1 kg)  10/29/17 220 lb (99.8 kg)  10/10/17 220 lb 12 oz (100.1 kg)    Physical Exam  Constitutional: She is oriented to person, place, and time. No distress.  HENT:  Head: Normocephalic and  atraumatic.  Mouth/Throat: Oropharynx is clear and moist.  Eyes: Pupils are equal, round, and reactive to light. EOM are normal. Right eye exhibits normal extraocular motion and no nystagmus. Left eye exhibits normal extraocular motion and no nystagmus. Right pupil is round and reactive. Left pupil is round and reactive. Pupils are equal.  Neck: Normal range of motion. Neck supple. No JVD present. No neck rigidity.  Cardiovascular: Normal rate, regular rhythm and normal heart sounds.  No murmur heard. Pulmonary/Chest: Effort normal and breath sounds normal. No respiratory distress. She has no wheezes. She has no rales.  Abdominal: Soft. Bowel sounds are normal. She exhibits no mass.  Musculoskeletal: Normal range of motion. She exhibits no edema or tenderness.  Neurological: She is alert and oriented to person, place, and time. She has normal strength. She displays normal reflexes. She displays a negative Romberg sign. Coordination and gait normal. She displays no Babinski's sign on the right side. She displays no Babinski's sign on the left side.  Skin: Skin is warm and dry. No rash noted.  Psychiatric: She has a normal mood and affect. Her behavior is normal. Her mood appears not anxious. She is not agitated.  Vitals reviewed.   Lab Results  Component Value Date   WBC 10.0 09/17/2017   HGB 14.5 09/17/2017   HCT 43.2 09/17/2017   PLT 296.0 09/17/2017   GLUCOSE 106 (H) 11/19/2017   CHOL 125 01/31/2017   TRIG 129.0 01/31/2017   HDL 42.10 01/31/2017   LDLCALC 57 01/31/2017   ALT 15 09/18/2016   AST 14 09/18/2016   NA 145 11/19/2017   K 4.5 11/19/2017   CL 107 11/19/2017   CREATININE 0.79 11/19/2017   BUN 11 11/19/2017   CO2 33 (H) 11/19/2017   TSH 1.51 09/17/2017   HGBA1C 5.5 10/02/2017   MICROALBUR 10 01/31/2017    Mm Screening Breast Tomo Bilateral  Result Date: 09/16/2017 CLINICAL DATA:  Screening. EXAM: DIGITAL SCREENING BILATERAL MAMMOGRAM WITH TOMO AND CAD COMPARISON:   Previous exam(s). ACR Breast Density Category b: There are scattered areas of fibroglandular density. FINDINGS: There are no findings suspicious for malignancy. Images were processed with CAD. IMPRESSION: No mammographic evidence of malignancy. A result letter of this screening mammogram will be mailed directly to the patient. RECOMMENDATION: Screening mammogram in one year. (Code:SM-B-01Y) BI-RADS CATEGORY  1: Negative. Electronically Signed   By: Abelardo Diesel M.D.   On: 09/16/2017 10:27    Assessment & Plan:   April May was seen today for hypertension and headache.  Diagnoses and all orders for this visit:  Vitamin B12 deficiency -     cyanocobalamin ((VITAMIN B-12)) injection 1,000 mcg  HYPERTENSION, BENIGN ESSENTIAL- Her blood pressure is not adequately well controlled and her vitamin D level remains low.  I will increase the dose on the vitamin D supplement.  She will continue her current antihypertensives and I will add amlodipine as well. -     Basic metabolic panel; Future -  VITAMIN D 25 Hydroxy (Vit-D Deficiency, Fractures); Future -     Urinalysis, Routine w reflex microscopic; Future -     amLODipine (NORVASC) 5 MG tablet; Take 1 tablet (5 mg total) by mouth daily.  Vitamin D deficiency -     VITAMIN D 25 Hydroxy (Vit-D Deficiency, Fractures); Future -     Cholecalciferol 50000 units capsule; Take 1 capsule (50,000 Units total) by mouth once a week.  Intractable episodic headache, unspecified headache type-  She has new onset, atypical headache.  I have asked her to undergo an MRI to screen for mass, bleed, or CVA. -     MR Brain Wo Contrast; Future   I have discontinued Lily W. Pettinato's Cholecalciferol. I am also having her start on Cholecalciferol and amLODipine. Additionally, I am having her maintain her fluticasone, oxyCODONE-acetaminophen, albuterol, spironolactone, potassium chloride SA, Indacaterol-Glycopyrrolate, telmisartan, acetaminophen, nebivolol, buPROPion,  lamoTRIgine, and clonazePAM. We administered cyanocobalamin.  Meds ordered this encounter  Medications  . cyanocobalamin ((VITAMIN B-12)) injection 1,000 mcg  . Cholecalciferol 50000 units capsule    Sig: Take 1 capsule (50,000 Units total) by mouth once a week.    Dispense:  12 capsule    Refill:  1  . amLODipine (NORVASC) 5 MG tablet    Sig: Take 1 tablet (5 mg total) by mouth daily.    Dispense:  90 tablet    Refill:  1     Follow-up: Return in about 2 months (around 01/20/2018).  Scarlette Calico, MDI have reviewed and agree

## 2017-11-19 NOTE — Patient Instructions (Signed)

## 2017-11-30 ENCOUNTER — Other Ambulatory Visit: Payer: Self-pay | Admitting: Internal Medicine

## 2017-11-30 DIAGNOSIS — I10 Essential (primary) hypertension: Secondary | ICD-10-CM

## 2017-11-30 MED ORDER — TELMISARTAN 40 MG PO TABS: 40.0000 mg | ORAL_TABLET | Freq: Every day | ORAL | 1 refills | Status: DC

## 2017-12-03 ENCOUNTER — Telehealth: Payer: Self-pay | Admitting: Internal Medicine

## 2017-12-03 NOTE — Telephone Encounter (Signed)
Message  Received: Yesterday  Message Contents  Gorden Harms, MD        Called pt about MRI she said her blood pressure was high and has since come down and she hasn't had any headaches since. Would like to wait until she see you again to see if she needs MRI.   Will cancel for now.

## 2017-12-05 ENCOUNTER — Encounter: Payer: Self-pay | Admitting: Internal Medicine

## 2017-12-11 ENCOUNTER — Ambulatory Visit (INDEPENDENT_AMBULATORY_CARE_PROVIDER_SITE_OTHER): Payer: Medicare Other | Admitting: Gastroenterology

## 2017-12-11 ENCOUNTER — Encounter: Payer: Self-pay | Admitting: Gastroenterology

## 2017-12-11 VITALS — BP 152/76 | HR 84 | Ht 61.0 in | Wt 223.0 lb

## 2017-12-11 DIAGNOSIS — K50019 Crohn's disease of small intestine with unspecified complications: Secondary | ICD-10-CM

## 2017-12-11 MED ORDER — BUDESONIDE 3 MG PO CPEP: 9.0000 mg | ORAL_CAPSULE | Freq: Every day | ORAL | 1 refills | Status: DC

## 2017-12-11 NOTE — Patient Instructions (Signed)
We have sent the following medications to your pharmacy for you to pick up at your convenience: Entocort.  Please contact our office if this medication is not covered by your insurance.   Thank you for choosing me and [facility].  Pricilla Riffle. Dagoberto Ligas., MD., Marval Regal

## 2017-12-11 NOTE — Progress Notes (Signed)
    History of Present Illness: This is a 62 year old female with Crohn's ileocolitis status post TI and right colon resection in 2016 returning for follow-up.  See May 7 office visit. She has previously declined medications for Crohn's disease.  She has persistent problems with watery diarrhea that has not changed since her surgery.  Colonoscopy 09/2017 - A few erosions in the neo-terminal ileum. Biopsied. (severe active Crohn's ileitis) - Patent end-to-side ileo-colonic anastomosis, characterized by erosion. - The examination was otherwise normal on direct and retroflexion views.  Current Medications, Allergies, Past Medical History, Past Surgical History, Family History and Social History were reviewed in [employer] record.  Physical Exam: General: Well developed, well nourished, no acute distress Head: Normocephalic and atraumatic Eyes:  sclerae anicteric, EOMI Ears: Normal auditory acuity Neurological: Alert oriented x 4, grossly nonfocal Psychological:  Alert and cooperative. Normal mood and affect  Assessment and Recommendations:  1.  Active Crohn's ileitis.  We reviewed in detail her biopsy findings and the risk involved of active Crohn's disease progressing to more severe symptoms and possibly progressing to the need for repeat surgery.  I recommended she start a biologic medication for management.  She declines to use biologic medications as she is worried about increased risk of skin cancer.  She is very reluctant to be on any Crohn's medication because she feels she is not having any symptoms from her Crohn's at this time.  We discussed the possibility that her diarrhea could be from Crohn's although she believes its postoperative.  After further discussion she is agreeable to a trial of Entocort 9 mg daily for 2 months. REV in 2 months.   2.  B12 deficiency. Treatment per PCP.   I spent 15 minutes of face-to-face time with the patient. Greater than 50% of  the time was spent counseling and coordinating care.

## 2017-12-19 ENCOUNTER — Telehealth: Payer: Self-pay | Admitting: Internal Medicine

## 2017-12-19 DIAGNOSIS — D225 Melanocytic nevi of trunk: Secondary | ICD-10-CM | POA: Diagnosis not present

## 2017-12-19 DIAGNOSIS — D485 Neoplasm of uncertain behavior of skin: Secondary | ICD-10-CM | POA: Diagnosis not present

## 2017-12-19 DIAGNOSIS — Z85828 Personal history of other malignant neoplasm of skin: Secondary | ICD-10-CM | POA: Diagnosis not present

## 2017-12-19 DIAGNOSIS — Z08 Encounter for follow-up examination after completed treatment for malignant neoplasm: Secondary | ICD-10-CM | POA: Diagnosis not present

## 2017-12-19 DIAGNOSIS — Z1283 Encounter for screening for malignant neoplasm of skin: Secondary | ICD-10-CM | POA: Diagnosis not present

## 2017-12-19 NOTE — Telephone Encounter (Signed)
Copied from [place] [id number]. Topic: Quick Communication - See Telephone Encounter >> Dec 19, 2017 10:00 AM Ahmed Prima L wrote: CRM for notification. See Telephone encounter for: 12/19/17.  Patient wants to know can she go ahead and set up her B12, she said she had it on 7/9. Patient said she gets them every month but I do not see indication of that for each month so I did not schedule it.

## 2017-12-19 NOTE — Telephone Encounter (Signed)
Appointment has been made for 8/9

## 2017-12-20 ENCOUNTER — Ambulatory Visit (INDEPENDENT_AMBULATORY_CARE_PROVIDER_SITE_OTHER): Payer: Medicare Other

## 2017-12-20 DIAGNOSIS — E538 Deficiency of other specified B group vitamins: Secondary | ICD-10-CM | POA: Diagnosis not present

## 2017-12-20 MED ORDER — CYANOCOBALAMIN 1000 MCG/ML IJ SOLN
1000.0000 ug | Freq: Once | INTRAMUSCULAR | Status: AC
  Administered 2017-12-20: 1000 ug via INTRAMUSCULAR

## 2017-12-20 NOTE — Progress Notes (Signed)
B12 was given

## 2018-01-02 ENCOUNTER — Other Ambulatory Visit: Payer: Self-pay | Admitting: Gastroenterology

## 2018-01-06 ENCOUNTER — Telehealth: Payer: Self-pay | Admitting: Gastroenterology

## 2018-01-07 NOTE — Telephone Encounter (Signed)
I'm not aware that budesonide has a definite impact on Vitamin D levels however her PCP should monitor her Vitamin D level and adjust her dose as necessary. Budesonide should not increase skin cancer risk. However she should have skin exams with her dermatologist to monitor. Since she is improved I recommend that she remains on budesonide.

## 2018-01-07 NOTE — Telephone Encounter (Signed)
Informed patient to contact her PCP regarding Vitamin D levels. Informed patient of Dr. Lynne Leader recommendations and to continue budesonide until appointment. Patient verbalized understanding.

## 2018-01-07 NOTE — Telephone Encounter (Signed)
Patient states since starting the budesonide for her Crohn's, it has been improving so much. She is having 1-2 BM's versus 5 -7 BM's daily. Patient states she wants to continue the medication but she has concerns since she googled her Vitamin D deficiency and this medication. Patient states she is concerned her Vitamin D even when taking a supplement is continuing to get lower and she thinks it's due to this medication. She is also concerned about her body fighting off her skin cancer since this medication can lower her immune system. I informed patient to contact her PCP about her Vitamin D deficiency and to see if he needs to increase her dose. Informed patient that I will send this note to Dr. Fuller Plan to see if the budesonide can effect Vitamin D deficiency.

## 2018-01-08 ENCOUNTER — Encounter: Payer: Self-pay | Admitting: Internal Medicine

## 2018-01-08 ENCOUNTER — Ambulatory Visit (INDEPENDENT_AMBULATORY_CARE_PROVIDER_SITE_OTHER): Payer: Medicare Other | Admitting: Internal Medicine

## 2018-01-08 ENCOUNTER — Other Ambulatory Visit (INDEPENDENT_AMBULATORY_CARE_PROVIDER_SITE_OTHER): Payer: Medicare Other

## 2018-01-08 VITALS — BP 160/96 | HR 63 | Temp 98.7°F | Ht 61.0 in | Wt 223.0 lb

## 2018-01-08 DIAGNOSIS — E559 Vitamin D deficiency, unspecified: Secondary | ICD-10-CM | POA: Diagnosis not present

## 2018-01-08 DIAGNOSIS — E785 Hyperlipidemia, unspecified: Secondary | ICD-10-CM

## 2018-01-08 DIAGNOSIS — T502X5A Adverse effect of carbonic-anhydrase inhibitors, benzothiadiazides and other diuretics, initial encounter: Secondary | ICD-10-CM

## 2018-01-08 DIAGNOSIS — E876 Hypokalemia: Secondary | ICD-10-CM | POA: Diagnosis not present

## 2018-01-08 DIAGNOSIS — I10 Essential (primary) hypertension: Secondary | ICD-10-CM

## 2018-01-08 DIAGNOSIS — Z23 Encounter for immunization: Secondary | ICD-10-CM

## 2018-01-08 DIAGNOSIS — E538 Deficiency of other specified B group vitamins: Secondary | ICD-10-CM

## 2018-01-08 DIAGNOSIS — F411 Generalized anxiety disorder: Secondary | ICD-10-CM

## 2018-01-08 DIAGNOSIS — F3341 Major depressive disorder, recurrent, in partial remission: Secondary | ICD-10-CM

## 2018-01-08 LAB — CBC WITH DIFFERENTIAL/PLATELET
BASOS PCT: 0.5 % (ref 0.0–3.0)
Basophils Absolute: 0.1 10*3/uL (ref 0.0–0.1)
EOS ABS: 0.1 10*3/uL (ref 0.0–0.7)
EOS PCT: 1.1 % (ref 0.0–5.0)
HEMATOCRIT: 45.6 % (ref 36.0–46.0)
HEMOGLOBIN: 15.1 g/dL — AB (ref 12.0–15.0)
LYMPHS PCT: 29.9 % (ref 12.0–46.0)
Lymphs Abs: 3.6 10*3/uL (ref 0.7–4.0)
MCHC: 33.2 g/dL (ref 30.0–36.0)
MCV: 93.1 fl (ref 78.0–100.0)
Monocytes Absolute: 1.1 10*3/uL — ABNORMAL HIGH (ref 0.1–1.0)
Monocytes Relative: 8.7 % (ref 3.0–12.0)
Neutro Abs: 7.3 10*3/uL (ref 1.4–7.7)
Neutrophils Relative %: 59.8 % (ref 43.0–77.0)
Platelets: 277 10*3/uL (ref 150.0–400.0)
RBC: 4.9 Mil/uL (ref 3.87–5.11)
RDW: 15.4 % (ref 11.5–15.5)
WBC: 12.1 10*3/uL — AB (ref 4.0–10.5)

## 2018-01-08 LAB — BASIC METABOLIC PANEL
BUN: 11 mg/dL (ref 6–23)
CHLORIDE: 102 meq/L (ref 96–112)
CO2: 33 mEq/L — ABNORMAL HIGH (ref 19–32)
Calcium: 9.6 mg/dL (ref 8.4–10.5)
Creatinine, Ser: 0.92 mg/dL (ref 0.40–1.20)
GFR: 79.5 mL/min (ref >60.00)
Glucose, Bld: 106 mg/dL — ABNORMAL HIGH (ref 70–99)
POTASSIUM: 3.5 meq/L (ref 3.5–5.1)
Sodium: 147 mEq/L — ABNORMAL HIGH (ref 135–145)

## 2018-01-08 LAB — TSH: TSH: 1.5 u[IU]/mL (ref 0.35–4.50)

## 2018-01-08 LAB — VITAMIN B12: VITAMIN B 12: 613 pg/mL (ref 211–911)

## 2018-01-08 LAB — VITAMIN D 25 HYDROXY (VIT D DEFICIENCY, FRACTURES): VITD: 35.2 ng/mL (ref 30.00–100.00)

## 2018-01-08 MED ORDER — SPIRONOLACTONE 50 MG PO TABS: 50.0000 mg | ORAL_TABLET | Freq: Every day | ORAL | 0 refills | Status: DC

## 2018-01-08 NOTE — Progress Notes (Signed)
Subjective:  Patient ID: Benay Spice, female    DOB: [birth year]  Age: 62 y.o. MRN: [id number]  CC: Hypertension   HPI [facility] Maddy presents for f/up -she is concerned that her blood pressure is not adequately well controlled.  She complains of fatigue but denies any recent episodes of headache, blurred vision, chest pain, shortness of breath, edema, or palpitations.  Outpatient Medications Prior to Visit  Medication Sig Dispense Refill  . acetaminophen ([employer] PAIN RELIEF EXTRA STRENGTH) 500 MG tablet Take 1 tablet (500 mg total) by mouth every 8 (eight) hours as needed for moderate pain. 60 tablet 0  . albuterol (PROVENTIL HFA;VENTOLIN HFA) 108 (90 Base) MCG/ACT inhaler Inhale 2 puffs into the lungs every 6 (six) hours as needed for wheezing or shortness of breath. 1 Inhaler 5  . amLODipine (NORVASC) 5 MG tablet Take 1 tablet (5 mg total) by mouth daily. 90 tablet 1  . budesonide (ENTOCORT EC) 3 MG 24 hr capsule TAKE 3 CAPSULES (9 MG TOTAL) BY MOUTH DAILY. 90 capsule 0  . buPROPion (WELLBUTRIN XL) 150 MG 24 hr tablet TAKE 1 TABLET BY MOUTH ONCE A DAY EARLY IN THE MORNING 90 tablet 1  . Cholecalciferol 50000 units capsule Take 1 capsule (50,000 Units total) by mouth once a week. 12 capsule 1  . clonazePAM (KLONOPIN) 0.5 MG tablet TAKE 1 TABLET BY MOUTH TWO  TIMES DAILY AS NEEDED 180 tablet 0  . fluticasone (FLONASE) 50 MCG/ACT nasal spray USE 2 SPRAYS INTO BOTH  NOSTRILS DAILY AS NEEDED  FOR ALLERGIES OR RHINITIS. 32 g 3  . Indacaterol-Glycopyrrolate (UTIBRON NEOHALER) 27.5-15.6 MCG CAPS Place 1 Act into inhaler and inhale 2 (two) times daily. (Patient taking differently: Place 1 Act into inhaler and inhale as needed. ) 60 capsule 11  . lamoTRIgine (LAMICTAL) 200 MG tablet TAKE 1 TABLET BY MOUTH AT  BEDTIME 90 tablet 1  . nebivolol (BYSTOLIC) 10 MG tablet Take 1 tablet (10 mg total) by mouth daily. 90 tablet 1  . oxyCODONE-acetaminophen (PERCOCET/ROXICET) 5-325 MG tablet  Take 1 tablet by mouth every 8 (eight) hours as needed for severe pain. 75 tablet 0  . telmisartan (MICARDIS) 40 MG tablet Take 1 tablet (40 mg total) by mouth daily. 90 tablet 1  . potassium chloride SA (K-DUR,KLOR-CON) 20 MEQ tablet Take 1 tablet (20 mEq total) by mouth 2 (two) times daily. 60 tablet 2  . spironolactone (ALDACTONE) 25 MG tablet Take 1 tablet (25 mg total) by mouth daily. 90 tablet 1   No facility-administered medications prior to visit.     ROS Review of Systems  Constitutional: Positive for fatigue. Negative for appetite change, diaphoresis and unexpected weight change.  HENT: Negative.   Eyes: Negative for visual disturbance.  Respiratory: Negative for cough, chest tightness, shortness of breath and wheezing.   Cardiovascular: Negative for chest pain, palpitations and leg swelling.  Gastrointestinal: Negative for abdominal pain, constipation, diarrhea, nausea and vomiting.  Endocrine: Negative.   Genitourinary: Negative.  Negative for difficulty urinating and dysuria.  Musculoskeletal: Negative.  Negative for arthralgias, joint swelling and myalgias.  Skin: Negative.  Negative for color change, pallor and rash.  Neurological: Negative.  Negative for dizziness, weakness, light-headedness and headaches.  Hematological: Negative for adenopathy. Does not bruise/bleed easily.  Psychiatric/Behavioral: Positive for dysphoric mood. Negative for agitation, behavioral problems, confusion, decreased concentration, hallucinations, self-injury, sleep disturbance and suicidal ideas. The patient is nervous/anxious.        Her PHQ 9 is 17 and  her GAD 7 is 9    Objective:  BP (!) 160/96 (BP Location: Left Arm, Patient Position: Sitting, Cuff Size: Large)   Pulse 63   Temp 98.7 F (37.1 C) (Oral)   Ht 5' 1"  (1.549 m)   Wt 223 lb (101.2 kg)   LMP 05/14/1988   SpO2 98%   BMI 42.14 kg/m   BP Readings from Last 3 Encounters:  01/08/18 (!) 160/96  12/11/17 (!) 152/76  11/19/17  (!) 152/98    Wt Readings from Last 3 Encounters:  01/08/18 223 lb (101.2 kg)  12/11/17 223 lb (101.2 kg)  11/19/17 225 lb (102.1 kg)    Physical Exam  Constitutional: She is oriented to person, place, and time. No distress.  HENT:  Mouth/Throat: Oropharynx is clear and moist. No oropharyngeal exudate.  Eyes: Conjunctivae are normal. No scleral icterus.  Neck: Normal range of motion. Neck supple. No JVD present. No thyromegaly present.  Cardiovascular: Normal rate and regular rhythm. Exam reveals no gallop.  Murmur heard.  Systolic murmur is present with a grade of 1/6.  No diastolic murmur is present. 1/6 SEM RUSB  Pulmonary/Chest: Effort normal and breath sounds normal. She has no wheezes. She has no rhonchi. She has no rales.  Abdominal: Soft. Normal appearance and bowel sounds are normal. She exhibits no mass. There is no hepatosplenomegaly. There is no tenderness. No hernia.  Musculoskeletal: Normal range of motion. She exhibits no edema or deformity.  Lymphadenopathy:    She has no cervical adenopathy.  Neurological: She is alert and oriented to person, place, and time.  Skin: Skin is warm and dry. She is not diaphoretic. No pallor.  Psychiatric: She has a normal mood and affect. Her behavior is normal. Judgment and thought content normal.  Vitals reviewed.   Lab Results  Component Value Date   WBC 12.1 (H) 01/08/2018   HGB 15.1 (H) 01/08/2018   HCT 45.6 01/08/2018   PLT 277.0 01/08/2018   GLUCOSE 106 (H) 01/08/2018   CHOL 125 01/31/2017   TRIG 129.0 01/31/2017   HDL 42.10 01/31/2017   LDLCALC 57 01/31/2017   ALT 15 09/18/2016   AST 14 09/18/2016   NA 147 (H) 01/08/2018   K 3.5 01/08/2018   CL 102 01/08/2018   CREATININE 0.92 01/08/2018   BUN 11 01/08/2018   CO2 33 (H) 01/08/2018   TSH 1.50 01/08/2018   HGBA1C 5.5 10/02/2017   MICROALBUR 10 01/31/2017    Mm Screening Breast Tomo Bilateral  Result Date: 09/16/2017 CLINICAL DATA:  Screening. EXAM: DIGITAL  SCREENING BILATERAL MAMMOGRAM WITH TOMO AND CAD COMPARISON:  Previous exam(s). ACR Breast Density Category b: There are scattered areas of fibroglandular density. FINDINGS: There are no findings suspicious for malignancy. Images were processed with CAD. IMPRESSION: No mammographic evidence of malignancy. A result letter of this screening mammogram will be mailed directly to the patient. RECOMMENDATION: Screening mammogram in one year. (Code:SM-B-01Y) BI-RADS CATEGORY  1: Negative. Electronically Signed   By: Abelardo Diesel M.D.   On: 09/16/2017 10:27    Assessment & Plan:   Zenna was seen today for hypertension.  Diagnoses and all orders for this visit:  HYPERTENSION, BENIGN ESSENTIAL- Her blood pressure is not adequately well controlled.  Her heart rate is in the low 60s so I do not feel comfortable increasing the dose of Bystolic.  Her potassium level is borderline low at 3.5.  Her bicarb is elevated and she has a normal anion gap which is consistent with the  metabolic alkalosis caused by spironolactone.  She is not symptomatic with this so I think the best route to get better blood pressure control at this time is to double the dose of spironolactone.  I asked her again to quit smoking. -     Basic metabolic panel; Future -     TSH; Future -     Discontinue: spironolactone (ALDACTONE) 50 MG tablet; Take 1 tablet (50 mg total) by mouth daily.  Hyperlipidemia LDL goal <130- She is not willing to take a statin. -     TSH; Future  Diuretic-induced hypokalemia- Her potassium level remains borderline low at 3.5.  I will increase her spironolactone dose. -     Basic metabolic panel; Future -     Discontinue: spironolactone (ALDACTONE) 50 MG tablet; Take 1 tablet (50 mg total) by mouth daily.  Vitamin D deficiency- Improvement noted -     VITAMIN D 25 Hydroxy (Vit-D Deficiency, Fractures); Future  Vitamin B12 deficiency- Improvement noted. -     CBC with Differential/Platelet; Future -      Vitamin B12; Future  Need for influenza vaccination -     Flu Vaccine QUAD 36+ mos IM   I have discontinued Miyana W. Graddy's spironolactone and potassium chloride SA. I am also having her maintain her fluticasone, oxyCODONE-acetaminophen, albuterol, Indacaterol-Glycopyrrolate, acetaminophen, nebivolol, buPROPion, lamoTRIgine, clonazePAM, Cholecalciferol, amLODipine, telmisartan, and budesonide.  Meds ordered this encounter  Medications  . DISCONTD: spironolactone (ALDACTONE) 50 MG tablet    Sig: Take 1 tablet (50 mg total) by mouth daily.    Dispense:  90 tablet    Refill:  0     Follow-up: No follow-ups on file.  Scarlette Calico, MD

## 2018-01-08 NOTE — Patient Instructions (Signed)

## 2018-01-09 ENCOUNTER — Encounter: Payer: Self-pay | Admitting: Internal Medicine

## 2018-01-09 MED ORDER — SPIRONOLACTONE 50 MG PO TABS: 50.0000 mg | ORAL_TABLET | Freq: Every day | ORAL | 0 refills | Status: DC

## 2018-01-10 NOTE — Assessment & Plan Note (Signed)
She has lingering symptoms but is not willing to change her antidepressant or to add an SSRI or SNRI at this time.  He has multiple social stressors.  I encouraged her to start psychotherapy.

## 2018-01-20 ENCOUNTER — Telehealth: Payer: Self-pay | Admitting: Gastroenterology

## 2018-01-20 NOTE — Telephone Encounter (Signed)
[facility] states they need a new prescription for budesonide per patient. Informed [facility] patient may not be on this medication long-term so we are not going to have it filled right now with them. Called patient and explained she is to follow-up at the end of September and we will decide if she will continue medication. Patient states she just had the medication filled and she doesn't need a prescription right now anyway.

## 2018-02-05 ENCOUNTER — Other Ambulatory Visit: Payer: Self-pay | Admitting: Gastroenterology

## 2018-02-06 ENCOUNTER — Ambulatory Visit: Payer: Medicare Other | Admitting: Gastroenterology

## 2018-02-08 ENCOUNTER — Other Ambulatory Visit: Payer: Self-pay | Admitting: Internal Medicine

## 2018-02-08 DIAGNOSIS — E559 Vitamin D deficiency, unspecified: Secondary | ICD-10-CM

## 2018-02-10 ENCOUNTER — Ambulatory Visit (INDEPENDENT_AMBULATORY_CARE_PROVIDER_SITE_OTHER): Payer: Medicare Other

## 2018-02-10 ENCOUNTER — Telehealth: Payer: Self-pay | Admitting: Internal Medicine

## 2018-02-10 DIAGNOSIS — E538 Deficiency of other specified B group vitamins: Secondary | ICD-10-CM

## 2018-02-10 MED ORDER — CYANOCOBALAMIN 1000 MCG/ML IJ SOLN
1000.0000 ug | Freq: Once | INTRAMUSCULAR | Status: AC
  Administered 2018-02-10: 1000 ug via INTRAMUSCULAR

## 2018-02-10 NOTE — Telephone Encounter (Signed)
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 Please call patient back in regard.

## 2018-02-12 NOTE — Telephone Encounter (Signed)
Contacted pt to confirm what the letter needs to say. 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

## 2018-02-14 NOTE — Telephone Encounter (Signed)
Letter completed, stamped and mailed to pt address on file.

## 2018-02-19 ENCOUNTER — Ambulatory Visit: Payer: Medicare Other

## 2018-03-05 ENCOUNTER — Ambulatory Visit (INDEPENDENT_AMBULATORY_CARE_PROVIDER_SITE_OTHER): Payer: Medicare Other

## 2018-03-05 ENCOUNTER — Telehealth: Payer: Self-pay

## 2018-03-05 DIAGNOSIS — Z111 Encounter for screening for respiratory tuberculosis: Secondary | ICD-10-CM | POA: Diagnosis not present

## 2018-03-05 NOTE — Telephone Encounter (Signed)
Patient came in today for TB test, requested to have blood pressure checked---blood pressure was 168/110, no dizziness, no shortness of breath, no headache--AND she has not taken any of her blood pressure medications today---patient advised to take all her bp meds as prescribed and wait about 2 hours to retake blood pressure at home---can call back if reading is not better, or if symptoms worsen, ok to talk with Petar Mucci,RN at [facility] office

## 2018-03-07 ENCOUNTER — Telehealth: Payer: Self-pay

## 2018-03-07 ENCOUNTER — Encounter: Payer: Self-pay | Admitting: *Deleted

## 2018-03-07 LAB — TB SKIN TEST
INDURATION: 1 mm
TB SKIN TEST: NEGATIVE

## 2018-03-07 NOTE — Telephone Encounter (Signed)
Routing to dr jones---patient tends to become sleepy when she takes wellbutrin in the mornings----can she start taking wellbutrin at night when she goes to bed so that she is more alert during the day---please advise, I will send mychart message per patient request, thanks

## 2018-03-08 ENCOUNTER — Other Ambulatory Visit: Payer: Self-pay | Admitting: Gastroenterology

## 2018-03-17 DIAGNOSIS — Z08 Encounter for follow-up examination after completed treatment for malignant neoplasm: Secondary | ICD-10-CM | POA: Diagnosis not present

## 2018-03-17 DIAGNOSIS — D225 Melanocytic nevi of trunk: Secondary | ICD-10-CM | POA: Diagnosis not present

## 2018-03-17 DIAGNOSIS — B351 Tinea unguium: Secondary | ICD-10-CM | POA: Diagnosis not present

## 2018-03-17 DIAGNOSIS — Z1283 Encounter for screening for malignant neoplasm of skin: Secondary | ICD-10-CM | POA: Diagnosis not present

## 2018-03-17 DIAGNOSIS — Z85828 Personal history of other malignant neoplasm of skin: Secondary | ICD-10-CM | POA: Diagnosis not present

## 2018-03-21 ENCOUNTER — Ambulatory Visit (INDEPENDENT_AMBULATORY_CARE_PROVIDER_SITE_OTHER): Payer: Medicare Other | Admitting: *Deleted

## 2018-03-21 ENCOUNTER — Ambulatory Visit: Payer: Medicare Other | Admitting: Gastroenterology

## 2018-03-21 ENCOUNTER — Encounter: Payer: Self-pay | Admitting: Gastroenterology

## 2018-03-21 VITALS — BP 132/80 | HR 76 | Ht 60.0 in | Wt 224.0 lb

## 2018-03-21 DIAGNOSIS — K5000 Crohn's disease of small intestine without complications: Secondary | ICD-10-CM

## 2018-03-21 DIAGNOSIS — E538 Deficiency of other specified B group vitamins: Secondary | ICD-10-CM

## 2018-03-21 MED ORDER — BUDESONIDE 3 MG PO CPEP: 9.0000 mg | ORAL_CAPSULE | Freq: Every day | ORAL | 1 refills | Status: DC

## 2018-03-21 MED ORDER — CYANOCOBALAMIN 1000 MCG/ML IJ SOLN
1000.0000 ug | Freq: Once | INTRAMUSCULAR | Status: AC
  Administered 2018-03-21: 1000 ug via INTRAMUSCULAR

## 2018-03-21 NOTE — Patient Instructions (Signed)
We have sent the following prescriptions to your mail in pharmacy: budesonide.   If you have not heard from your mail in pharmacy within 1 week or if you have not received your medication in the mail, please contact us at [phone] so we may find out why.  Thank you for choosing me and [facility].  Pricilla Riffle. Dagoberto Ligas., MD., Marval Regal

## 2018-03-21 NOTE — Progress Notes (Signed)
I have reviewed and agree.

## 2018-03-21 NOTE — Progress Notes (Signed)
    History of Present Illness: This is a 62 year old female with Crohn's disease of the small bowel.  She has had a very good response to Entocort 9 mg daily with substantial improvement of her diarrhea.  Improvement from approximately 10 loose bowel movements per day to 2-3 bowel movements per day which are generally semi-formed.  Current Medications, Allergies, Past Medical History, Past Surgical History, Family History and Social History were reviewed in [employer] record.  Physical Exam: General: Well developed, well nourished, no acute distress Head: Normocephalic and atraumatic Eyes:  sclerae anicteric, EOMI Ears: Normal auditory acuity Mouth: No deformity or lesions Lungs: Clear throughout to auscultation Heart: Regular rate and rhythm; no murmurs, rubs or bruits Abdomen: Soft, non tender and non distended. No masses, hepatosplenomegaly or hernias noted. Normal Bowel sounds Rectal: Not done Musculoskeletal: Symmetrical with no gross deformities  Pulses:  Normal pulses noted Extremities: No clubbing, cyanosis, edema or deformities noted Neurological: Alert oriented x 4, grossly nonfocal Psychological:  Alert and cooperative. Normal mood and affect   Assessment and Recommendations:  1.  Crohn's disease of the small bowel.  Very good response to Entocort 9 mg daily.  We discussed management options and given her good response plan to continue Entocort.  REV in 6 months.   I spent 15 minutes of face-to-face time with the patient. Greater than 50% of the time was spent counseling and coordinating care.

## 2018-03-24 ENCOUNTER — Other Ambulatory Visit: Payer: Self-pay | Admitting: Internal Medicine

## 2018-03-24 DIAGNOSIS — F321 Major depressive disorder, single episode, moderate: Secondary | ICD-10-CM

## 2018-03-24 DIAGNOSIS — I10 Essential (primary) hypertension: Secondary | ICD-10-CM

## 2018-04-07 ENCOUNTER — Other Ambulatory Visit: Payer: Self-pay | Admitting: Internal Medicine

## 2018-04-07 DIAGNOSIS — I10 Essential (primary) hypertension: Secondary | ICD-10-CM

## 2018-04-07 DIAGNOSIS — T502X5A Adverse effect of carbonic-anhydrase inhibitors, benzothiadiazides and other diuretics, initial encounter: Secondary | ICD-10-CM

## 2018-04-07 DIAGNOSIS — E876 Hypokalemia: Secondary | ICD-10-CM

## 2018-04-21 ENCOUNTER — Encounter: Payer: Self-pay | Admitting: Internal Medicine

## 2018-04-21 ENCOUNTER — Telehealth: Payer: Self-pay

## 2018-04-21 ENCOUNTER — Other Ambulatory Visit (INDEPENDENT_AMBULATORY_CARE_PROVIDER_SITE_OTHER): Payer: Medicare Other

## 2018-04-21 ENCOUNTER — Ambulatory Visit (INDEPENDENT_AMBULATORY_CARE_PROVIDER_SITE_OTHER): Payer: Medicare Other

## 2018-04-21 VITALS — BP 152/90

## 2018-04-21 DIAGNOSIS — E538 Deficiency of other specified B group vitamins: Secondary | ICD-10-CM | POA: Diagnosis not present

## 2018-04-21 LAB — VITAMIN B12: Vitamin B-12: 1123 pg/mL — ABNORMAL HIGH (ref 211–911)

## 2018-04-21 MED ORDER — CYANOCOBALAMIN 1000 MCG/ML IJ SOLN
1000.0000 ug | Freq: Once | INTRAMUSCULAR | Status: AC
  Administered 2018-04-21: 1000 ug via INTRAMUSCULAR

## 2018-04-21 NOTE — Telephone Encounter (Signed)
Patient came in today (nurse visit)---she reports still feeling very tired, bp reading today was 152/90---she has office visit scheduled with dr Ronnald Ramp on 12/18---do you want to make any changes to bp meds before you see patient next week---please advise, I will call patient back, thanks

## 2018-04-21 NOTE — Progress Notes (Signed)
I have reviewed and agree.

## 2018-04-23 ENCOUNTER — Other Ambulatory Visit: Payer: Self-pay | Admitting: Family

## 2018-04-30 ENCOUNTER — Ambulatory Visit (INDEPENDENT_AMBULATORY_CARE_PROVIDER_SITE_OTHER): Payer: Medicare Other | Admitting: Internal Medicine

## 2018-04-30 ENCOUNTER — Encounter: Payer: Self-pay | Admitting: Internal Medicine

## 2018-04-30 ENCOUNTER — Other Ambulatory Visit (INDEPENDENT_AMBULATORY_CARE_PROVIDER_SITE_OTHER): Payer: Medicare Other

## 2018-04-30 VITALS — BP 178/82 | HR 57 | Temp 98.0°F | Ht 60.0 in | Wt 225.8 lb

## 2018-04-30 DIAGNOSIS — I10 Essential (primary) hypertension: Secondary | ICD-10-CM | POA: Diagnosis not present

## 2018-04-30 DIAGNOSIS — E559 Vitamin D deficiency, unspecified: Secondary | ICD-10-CM

## 2018-04-30 DIAGNOSIS — E785 Hyperlipidemia, unspecified: Secondary | ICD-10-CM

## 2018-04-30 DIAGNOSIS — R7303 Prediabetes: Secondary | ICD-10-CM

## 2018-04-30 DIAGNOSIS — E538 Deficiency of other specified B group vitamins: Secondary | ICD-10-CM | POA: Diagnosis not present

## 2018-04-30 DIAGNOSIS — H6123 Impacted cerumen, bilateral: Secondary | ICD-10-CM

## 2018-04-30 DIAGNOSIS — E876 Hypokalemia: Secondary | ICD-10-CM

## 2018-04-30 DIAGNOSIS — T502X5A Adverse effect of carbonic-anhydrase inhibitors, benzothiadiazides and other diuretics, initial encounter: Secondary | ICD-10-CM

## 2018-04-30 LAB — LIPID PANEL
Cholesterol: 114 mg/dL (ref 0–200)
HDL: 48.1 mg/dL (ref >39.00)
LDL Cholesterol: 44 mg/dL (ref 0–99)
NonHDL: 66.31
Total CHOL/HDL Ratio: 2
Triglycerides: 111 mg/dL (ref 0.0–149.0)
VLDL: 22.2 mg/dL (ref 0.0–40.0)

## 2018-04-30 LAB — HEMOGLOBIN A1C: HEMOGLOBIN A1C: 5.7 % (ref 4.6–6.5)

## 2018-04-30 LAB — BASIC METABOLIC PANEL
BUN: 8 mg/dL (ref 6–23)
CHLORIDE: 102 meq/L (ref 96–112)
CO2: 34 mEq/L — ABNORMAL HIGH (ref 19–32)
Calcium: 9.7 mg/dL (ref 8.4–10.5)
Creatinine, Ser: 0.81 mg/dL (ref 0.40–1.20)
GFR: 91.99 mL/min (ref >60.00)
Glucose, Bld: 93 mg/dL (ref 70–99)
Potassium: 3.1 mEq/L — ABNORMAL LOW (ref 3.5–5.1)
SODIUM: 144 meq/L (ref 135–145)

## 2018-04-30 LAB — URINALYSIS, ROUTINE W REFLEX MICROSCOPIC
Bilirubin Urine: NEGATIVE
Hgb urine dipstick: NEGATIVE
Ketones, ur: NEGATIVE
Leukocytes, UA: NEGATIVE
Nitrite: NEGATIVE
RBC / HPF: NONE SEEN (ref 0–?)
Specific Gravity, Urine: <=1.005 — AB (ref 1.000–1.030)
Total Protein, Urine: NEGATIVE
Urine Glucose: NEGATIVE
Urobilinogen, UA: 0.2 (ref 0.0–1.0)
WBC UA: NONE SEEN (ref 0–?)
pH: 6.5 (ref 5.0–8.0)

## 2018-04-30 LAB — CBC WITH DIFFERENTIAL/PLATELET
Basophils Absolute: 0 10*3/uL (ref 0.0–0.1)
Basophils Relative: 0.4 % (ref 0.0–3.0)
Eosinophils Absolute: 0.2 10*3/uL (ref 0.0–0.7)
Eosinophils Relative: 1.3 % (ref 0.0–5.0)
HEMATOCRIT: 45.4 % (ref 36.0–46.0)
Hemoglobin: 15.2 g/dL — ABNORMAL HIGH (ref 12.0–15.0)
LYMPHS PCT: 24.8 % (ref 12.0–46.0)
Lymphs Abs: 3.1 10*3/uL (ref 0.7–4.0)
MCHC: 33.5 g/dL (ref 30.0–36.0)
MCV: 94.4 fl (ref 78.0–100.0)
MONOS PCT: 8.4 % (ref 3.0–12.0)
Monocytes Absolute: 1.1 10*3/uL — ABNORMAL HIGH (ref 0.1–1.0)
Neutro Abs: 8.2 10*3/uL — ABNORMAL HIGH (ref 1.4–7.7)
Neutrophils Relative %: 65.1 % (ref 43.0–77.0)
Platelets: 288 10*3/uL (ref 150.0–400.0)
RBC: 4.81 Mil/uL (ref 3.87–5.11)
RDW: 15 % (ref 11.5–15.5)
WBC: 12.6 10*3/uL — ABNORMAL HIGH (ref 4.0–10.5)

## 2018-04-30 LAB — FOLATE: Folate: 13.1 ng/mL (ref >5.9)

## 2018-04-30 LAB — VITAMIN B12: VITAMIN B 12: 898 pg/mL (ref 211–911)

## 2018-04-30 LAB — VITAMIN D 25 HYDROXY (VIT D DEFICIENCY, FRACTURES): VITD: 42.67 ng/mL (ref 30.00–100.00)

## 2018-04-30 MED ORDER — SPIRONOLACTONE 50 MG PO TABS: 50.0000 mg | ORAL_TABLET | Freq: Every day | ORAL | 0 refills | Status: DC

## 2018-04-30 MED ORDER — CLONIDINE 0.1 MG/24HR TD PTWK: 0.1000 mg | MEDICATED_PATCH | TRANSDERMAL | 0 refills | Status: DC

## 2018-04-30 MED ORDER — NEBIVOLOL HCL 10 MG PO TABS: 10.0000 mg | ORAL_TABLET | Freq: Every day | ORAL | 1 refills | Status: DC

## 2018-04-30 NOTE — Patient Instructions (Signed)

## 2018-04-30 NOTE — Progress Notes (Addendum)
Subjective:  Patient ID: April May, female    DOB: 12/08/1955  Age: 61 y.o. MRN: [id number]  CC: Hypertension   HPI [facility] [name] presents for a BP check - She tells me she has been taking all 4 for antihypertensives but according to prescription refills she would have run out of Bystolic about a month ago.  She continues to smoke cigarettes.  She  does not monitor her blood pressure at home.  She denies any recent episodes of headache, blurred vision, CP, DOE, palpitations, edema, or fatigue.  Outpatient Medications Prior to Visit  Medication Sig Dispense Refill  . acetaminophen ([employer] PAIN RELIEF EXTRA STRENGTH) 500 MG tablet Take 1 tablet (500 mg total) by mouth every 8 (eight) hours as needed for moderate pain. 60 tablet 0  . albuterol (PROVENTIL HFA;VENTOLIN HFA) 108 (90 Base) MCG/ACT inhaler Inhale 2 puffs into the lungs every 6 (six) hours as needed for wheezing or shortness of breath. 1 Inhaler 5  . amLODipine (NORVASC) 5 MG tablet Take 1 tablet (5 mg total) by mouth daily. 90 tablet 1  . budesonide (ENTOCORT EC) 3 MG 24 hr capsule Take 3 capsules (9 mg total) by mouth daily. 270 capsule 1  . buPROPion (WELLBUTRIN XL) 150 MG 24 hr tablet TAKE 1 TABLET BY MOUTH ONCE A DAY EARLY IN THE MORNING 90 tablet 1  . Cholecalciferol 50000 units capsule Take 1 capsule (50,000 Units total) by mouth once a week. 12 capsule 1  . Cyanocobalamin (B-12 COMPLIANCE INJECTION IJ) Inject as directed every 30 (thirty) days.    . fluticasone (FLONASE) 50 MCG/ACT nasal spray USE 2 SPRAYS INTO BOTH  NOSTRILS DAILY AS NEEDED  FOR ALLERGIES OR RHINITIS. 32 g 3  . Indacaterol-Glycopyrrolate (UTIBRON NEOHALER) 27.5-15.6 MCG CAPS Place 1 Act into inhaler and inhale 2 (two) times daily. (Patient taking differently: Place 1 Act into inhaler and inhale as needed. ) 60 capsule 11  . lamoTRIgine (LAMICTAL) 200 MG tablet TAKE 1 TABLET BY MOUTH AT  BEDTIME 90 tablet 1  . telmisartan (MICARDIS) 40 MG  tablet TAKE 1 TABLET BY MOUTH  DAILY 90 tablet 1  . vitamin B-12 (CYANOCOBALAMIN) 500 MCG tablet Take 500 mcg by mouth daily.    . clonazePAM (KLONOPIN) 0.5 MG tablet TAKE 1 TABLET BY MOUTH TWO  TIMES DAILY AS NEEDED 180 tablet 0  . nebivolol (BYSTOLIC) 10 MG tablet Take 1 tablet (10 mg total) by mouth daily. 90 tablet 1  . oxyCODONE-acetaminophen (PERCOCET/ROXICET) 5-325 MG tablet Take 1 tablet by mouth every 8 (eight) hours as needed for severe pain. 75 tablet 0  . spironolactone (ALDACTONE) 50 MG tablet Take 1 tablet (50 mg total) by mouth daily. 90 tablet 0  . spironolactone (ALDACTONE) 50 MG tablet TAKE 1 TABLET BY MOUTH EVERY DAY 90 tablet 0   No facility-administered medications prior to visit.     ROS Review of Systems  Constitutional: Negative for diaphoresis and fatigue.  HENT: Positive for hearing loss. Negative for congestion, ear discharge, ear pain and facial swelling.        Complains of a several week history of declining hearing  Eyes: Negative for visual disturbance.  Respiratory: Negative.  Negative for cough, chest tightness, shortness of breath and wheezing.   Cardiovascular: Negative for chest pain, palpitations and leg swelling.  Gastrointestinal: Negative for abdominal pain, constipation, diarrhea, nausea and vomiting.  Genitourinary: Negative.  Negative for decreased urine volume, difficulty urinating, dysuria, hematuria and urgency.  Musculoskeletal: Negative.  Negative for arthralgias, back pain and neck pain.  Skin: Negative.  Negative for color change and pallor.  Neurological: Negative.  Negative for dizziness, weakness, light-headedness and headaches.  Hematological: Negative for adenopathy. Does not bruise/bleed easily.  Psychiatric/Behavioral: Negative.     Objective:  BP (!) 178/82 (BP Location: Left Arm, Patient Position: Sitting, Cuff Size: Large)   Pulse (!) 57   Temp 98 F (36.7 C) (Oral)   Ht 5' (1.524 m)   Wt 225 lb 12 oz (102.4 kg)   LMP  05/14/1988   SpO2 96%   BMI 44.09 kg/m   BP Readings from Last 3 Encounters:  04/30/18 (!) 178/82  04/21/18 (!) 152/90  03/21/18 132/80    Wt Readings from Last 3 Encounters:  04/30/18 225 lb 12 oz (102.4 kg)  03/21/18 224 lb (101.6 kg)  01/08/18 223 lb (101.2 kg)    Physical Exam Vitals signs reviewed.  Constitutional:      General: She is not in acute distress.    Appearance: Normal appearance. She is obese. She is not diaphoretic.  HENT:     Right Ear: Tympanic membrane and external ear normal. Decreased hearing noted. There is impacted cerumen.     Left Ear: Tympanic membrane and external ear normal. Decreased hearing noted. There is impacted cerumen.     Ears:     Comments: I put Colace in both of her EACs.  I then irrigated it with water and used an ear pick to the remove the cerumen.  She tells me her hearing has now returned to normal and examination of the TM and EAC is now normal.    Nose: Nose normal.     Mouth/Throat:     Mouth: Mucous membranes are moist.     Pharynx: No posterior oropharyngeal erythema.  Eyes:     General: No scleral icterus.    Conjunctiva/sclera: Conjunctivae normal.  Neck:     Musculoskeletal: Normal range of motion and neck supple. No muscular tenderness.  Cardiovascular:     Rate and Rhythm: Normal rate and regular rhythm.     Pulses: Normal pulses.     Heart sounds: No murmur. No friction rub. No gallop.   Pulmonary:     Effort: Pulmonary effort is normal.     Breath sounds: No wheezing, rhonchi or rales.  Abdominal:     General: Abdomen is flat. Bowel sounds are normal.     Palpations: Abdomen is soft. There is no hepatomegaly, splenomegaly or mass.     Tenderness: There is no abdominal tenderness.  Musculoskeletal: Normal range of motion.        General: No swelling, tenderness or deformity.     Right lower leg: No edema.  Skin:    General: Skin is warm and dry.     Coloration: Skin is not pale.     Findings: No rash.    Neurological:     General: No focal deficit present.     Mental Status: She is alert and oriented to person, place, and time. Mental status is at baseline.     Lab Results  Component Value Date   WBC 12.6 (H) 04/30/2018   HGB 15.2 (H) 04/30/2018   HCT 45.4 04/30/2018   PLT 288.0 04/30/2018   GLUCOSE 93 04/30/2018   CHOL 114 04/30/2018   TRIG 111.0 04/30/2018   HDL 48.10 04/30/2018   LDLCALC 44 04/30/2018   ALT 15 09/18/2016   AST 14 09/18/2016   NA 144 04/30/2018  K 3.1 (L) 04/30/2018   CL 102 04/30/2018   CREATININE 0.81 04/30/2018   BUN 8 04/30/2018   CO2 34 (H) 04/30/2018   TSH 1.50 01/08/2018   HGBA1C 5.7 04/30/2018   MICROALBUR 10 01/31/2017    Mm Screening Breast Tomo Bilateral  Result Date: 09/16/2017 CLINICAL DATA:  Screening. EXAM: DIGITAL SCREENING BILATERAL MAMMOGRAM WITH TOMO AND CAD COMPARISON:  Previous exam(s). ACR Breast Density Category b: There are scattered areas of fibroglandular density. FINDINGS: There are no findings suspicious for malignancy. Images were processed with CAD. IMPRESSION: No mammographic evidence of malignancy. A result letter of this screening mammogram will be mailed directly to the patient. RECOMMENDATION: Screening mammogram in one year. (Code:SM-B-01Y) BI-RADS CATEGORY  1: Negative. Electronically Signed   By: Abelardo Diesel M.D.   On: 09/16/2017 10:27    Assessment & Plan:   Domonic was seen today for hypertension.  Diagnoses and all orders for this visit:  Malignant hypertension- Her blood pressure is not adequately well controlled.  I have asked her to restart Bystolic.  Her potassium level remains low at 3.1 so have asked her to double her spironolactone dose.  Will continue the other 2 antihypertensives at their current doses.  Will add on a clonidine patch as well.  Her labs are negative for secondary causes or endorgan damage.  I have asked her to undergo a renal ultrasound to see if there is any evidence of atrophy,  obstruction, or scarring. -     Basic metabolic panel; Future -     cloNIDine (CATAPRES - DOSED IN MG/24 HR) 0.1 mg/24hr patch; Place 1 patch (0.1 mg total) onto the skin once a week. -     Ambulatory referral to Nephrology -     Urinalysis, Routine w reflex microscopic; Future -     US Renal; Future -     nebivolol (BYSTOLIC) 10 MG tablet; Take 1 tablet (10 mg total) by mouth daily.  Hyperlipidemia LDL goal <130- She has a low Framingham risk score so I do not recommend a statin for CV risk reduction. -     Lipid panel; Future  Prediabetes -     Hemoglobin A1c; Future  Vitamin B12 deficiency -     CBC with Differential/Platelet; Future -     Folate; Future -     Vitamin B12; Future  Vitamin D deficiency -     VITAMIN D 25 Hydroxy (Vit-D Deficiency, Fractures); Future  HYPERTENSION, BENIGN ESSENTIAL -     spironolactone (ALDACTONE) 50 MG tablet; Take 1 tablet (50 mg total) by mouth daily.  Diuretic-induced hypokalemia -     spironolactone (ALDACTONE) 50 MG tablet; Take 1 tablet (50 mg total) by mouth daily.   I have discontinued Charday W. Galyon's oxyCODONE-acetaminophen, clonazePAM, and spironolactone. I have also changed her spironolactone. Additionally, I am having her start on cloNIDine. Lastly, I am having her maintain her fluticasone, albuterol, Indacaterol-Glycopyrrolate, acetaminophen, Cholecalciferol, amLODipine, vitamin B-12, Cyanocobalamin (B-12 COMPLIANCE INJECTION IJ), budesonide, lamoTRIgine, buPROPion, telmisartan, and nebivolol.  Meds ordered this encounter  Medications  . cloNIDine (CATAPRES - DOSED IN MG/24 HR) 0.1 mg/24hr patch    Sig: Place 1 patch (0.1 mg total) onto the skin once a week.    Dispense:  12 patch    Refill:  0  . spironolactone (ALDACTONE) 50 MG tablet    Sig: Take 1 tablet (50 mg total) by mouth daily.    Dispense:  90 tablet    Refill:  0  .  nebivolol (BYSTOLIC) 10 MG tablet    Sig: Take 1 tablet (10 mg total) by mouth daily.     Dispense:  90 tablet    Refill:  1     Follow-up: Return in about 2 months (around 07/01/2018).  Scarlette Calico, MD

## 2018-05-02 DIAGNOSIS — H6123 Impacted cerumen, bilateral: Secondary | ICD-10-CM | POA: Insufficient documentation

## 2018-05-06 ENCOUNTER — Other Ambulatory Visit: Payer: Self-pay | Admitting: Internal Medicine

## 2018-05-06 DIAGNOSIS — E559 Vitamin D deficiency, unspecified: Secondary | ICD-10-CM

## 2018-05-08 ENCOUNTER — Encounter: Payer: Self-pay | Admitting: Internal Medicine

## 2018-05-09 ENCOUNTER — Ambulatory Visit
Admission: RE | Admit: 2018-05-09 | Discharge: 2018-05-09 | Disposition: A | Discharge status: Home / Self Care | Payer: Medicare Other | Source: Ambulatory Visit | Attending: Internal Medicine | Admitting: Internal Medicine

## 2018-05-09 DIAGNOSIS — I10 Essential (primary) hypertension: Secondary | ICD-10-CM

## 2018-05-09 DIAGNOSIS — I150 Renovascular hypertension: Secondary | ICD-10-CM | POA: Diagnosis not present

## 2018-05-11 ENCOUNTER — Encounter: Payer: Self-pay | Admitting: Internal Medicine

## 2018-05-12 ENCOUNTER — Encounter: Payer: Self-pay | Admitting: Internal Medicine

## 2018-05-12 ENCOUNTER — Other Ambulatory Visit: Payer: Medicare Other

## 2018-05-12 ENCOUNTER — Other Ambulatory Visit: Payer: Self-pay | Admitting: Family

## 2018-05-12 DIAGNOSIS — I10 Essential (primary) hypertension: Secondary | ICD-10-CM

## 2018-05-13 MED ORDER — AMLODIPINE BESYLATE 5 MG PO TABS: 5.0000 mg | ORAL_TABLET | Freq: Every day | ORAL | 1 refills | Status: DC

## 2018-05-13 MED ORDER — ACETAMINOPHEN 500 MG PO TABS: 500.0000 mg | ORAL_TABLET | Freq: Three times a day (TID) | ORAL | 0 refills | Status: DC | PRN

## 2018-05-18 ENCOUNTER — Other Ambulatory Visit: Payer: Self-pay | Admitting: Internal Medicine

## 2018-05-18 DIAGNOSIS — I10 Essential (primary) hypertension: Secondary | ICD-10-CM

## 2018-05-20 ENCOUNTER — Other Ambulatory Visit: Payer: Self-pay

## 2018-05-20 ENCOUNTER — Encounter (HOSPITAL_COMMUNITY): Payer: Self-pay

## 2018-05-20 ENCOUNTER — Emergency Department (HOSPITAL_COMMUNITY)
Admission: EM | Admit: 2018-05-20 | Discharge: 2018-05-20 | Disposition: A | Discharge status: Home / Self Care | Payer: Medicare Other | Attending: Emergency Medicine | Admitting: Emergency Medicine

## 2018-05-20 DIAGNOSIS — Z5321 Procedure and treatment not carried out due to patient leaving prior to being seen by health care provider: Secondary | ICD-10-CM | POA: Insufficient documentation

## 2018-05-20 DIAGNOSIS — R51 Headache: Secondary | ICD-10-CM | POA: Insufficient documentation

## 2018-05-20 NOTE — ED Triage Notes (Signed)
Pt arrives POV for eval of headache and hypertension. Pt reports she has had issues getting her BP under control since 05/2017. Pt reports she recently started a clonidine patch but states that she consistently feels as though it is high. Pt reports intermittent HA, tylenol has only helped a little. Pt reports "I just want to get this under control." Pt in NAD, NARD.

## 2018-05-20 NOTE — ED Notes (Addendum)
After triage, pt asked what the wait time was. Advised her the longest patient in the waiting room had been waiting approx 7 hours to be seen. Pt asked this RN if I felt she needed to stay and be seen. Advised pt that we recommend she wait and be evaluated by a physician. Pt repeatedly asked this RN if she should stay and wait. This RN repeatedly encouraged pt to wait and be evaluated by MD. Pt refused and said "I'll go home and call an ambulance if I need to". Advised pt to return if anything changes or worsens, pt voiced understanding.

## 2018-05-26 ENCOUNTER — Ambulatory Visit: Payer: Medicare Other

## 2018-05-27 ENCOUNTER — Other Ambulatory Visit: Payer: Self-pay | Admitting: Internal Medicine

## 2018-05-27 ENCOUNTER — Telehealth: Payer: Self-pay | Admitting: *Deleted

## 2018-05-27 ENCOUNTER — Ambulatory Visit (INDEPENDENT_AMBULATORY_CARE_PROVIDER_SITE_OTHER): Payer: Medicare Other | Admitting: *Deleted

## 2018-05-27 VITALS — BP 170/100

## 2018-05-27 DIAGNOSIS — I10 Essential (primary) hypertension: Secondary | ICD-10-CM

## 2018-05-27 DIAGNOSIS — E538 Deficiency of other specified B group vitamins: Secondary | ICD-10-CM | POA: Diagnosis not present

## 2018-05-27 MED ORDER — CYANOCOBALAMIN 1000 MCG/ML IJ SOLN
1000.0000 ug | Freq: Once | INTRAMUSCULAR | Status: AC
  Administered 2018-05-27: 1000 ug via INTRAMUSCULAR

## 2018-05-27 MED ORDER — AMLODIPINE BESYLATE 10 MG PO TABS: 10.0000 mg | ORAL_TABLET | Freq: Every day | ORAL | 1 refills | Status: DC

## 2018-05-27 NOTE — Progress Notes (Signed)
Patient here for nurse visit b 12 injection. She asked me to check her bp. It is elevated. See vitals. PCP informed. I advised patient to increase Amlodipine 5 mg to 10 mg per Dr. Ronnald Ramp and continue all other medications as prescribed and keep ROV for next month.

## 2018-05-27 NOTE — Telephone Encounter (Signed)
Patient here in office today for nurse visit b 12 injection. She asked me to check her bp. It is elevated. See vitals. She states she has been taking all 4 oral meds as directed and she is using the clonidine patch.  PCP informed. I advised patient to increase Amlodipine 5 mg to 10 mg per Dr. Ronnald Ramp and continue all other medications as prescribed and keep ROV for next month.

## 2018-05-27 NOTE — Progress Notes (Signed)
I have reviewed and agree.

## 2018-05-29 ENCOUNTER — Encounter: Payer: Self-pay | Admitting: Internal Medicine

## 2018-05-29 ENCOUNTER — Telehealth: Payer: Self-pay | Admitting: Internal Medicine

## 2018-05-29 NOTE — Telephone Encounter (Signed)
More than likely not. We did get her set up last February with [employer], [place]. If she went to that appt, she can just call them back and get scheduled without a referral.

## 2018-05-29 NOTE — Telephone Encounter (Signed)
Copied from [place] [id number]. Topic: General - Other >> May 29, 2018  1:46 PM Keene Breath wrote: Reason for CRM: Patient called to speak with the nurse or doctor regarding a facial mole on her face that she sent a picture of to the doctor.  Patient wants to know if the doctor got the picture and if he wants her to come in to examine.  Please call patient back as soon as possible at [phone]

## 2018-05-29 NOTE — Progress Notes (Signed)
Subjective:    Patient ID: April May, female    DOB: 04-16-1956, 63 y.o.   MRN: [id number]  HPI The patient is here for an acute visit.   Possible cyst on face: She has had a cyst on her left check for 1-2 weeks.  She had discomfort initially, but denies any pain now.  It was getting bigger and recently there has been on change.  She tred putting hot water on it in case it was a boil, but there is no change.  She does have an appointment with her dermatologist in May and wanted to know if she needed to be seen sooner or not.  She has not had any fevers.  There is been no discharge.  There is no redness.  Medications and allergies reviewed with patient and updated if appropriate.  Patient Active Problem List   Diagnosis Date Noted  . Bilateral hearing loss due to cerumen impaction 05/02/2018  . Intractable episodic headache 11/19/2017  . Vitamin B12 deficiency 10/08/2017  . Abnormal electrocardiogram (ECG) (EKG) 10/08/2017  . Vitamin D deficiency 08/20/2017  . 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 DDD (degenerative disc disease), cervical 04/27/2017  . GAD (generalized anxiety disorder) 12/03/2016  . Aortic ejection murmur 03/25/2016  . Crohn's disease of both small and large intestine with fistula ([place]) 11/24/2014  . COPD with chronic bronchitis ([place]) 03/11/2014  . Diuretic-induced hypokalemia 01/22/2013  . Neck pain, bilateral 01/22/2013  . Hyperlipidemia LDL goal <130 06/13/2011  . Sleep apnea 04/27/2011  . Prediabetes 03/01/2011  . Obesity, Class II, BMI 35-39.9, with comorbidity 04/01/2009  . TOBACCO ABUSE 02/24/2008  . ALLERGIC RHINITIS 02/24/2008  . Depression 08/26/2007  . Malignant hypertension 08/04/2007    Current Outpatient Medications on File Prior to Visit  Medication Sig Dispense Refill  . acetaminophen ([employer] PAIN RELIEF EXTRA STRENGTH) 500 MG tablet Take 1 tablet (500 mg total) by mouth every 8 (eight) hours as needed for moderate  pain. 60 tablet 0  . albuterol (PROVENTIL HFA;VENTOLIN HFA) 108 (90 Base) MCG/ACT inhaler Inhale 2 puffs into the lungs every 6 (six) hours as needed for wheezing or shortness of breath. 1 Inhaler 5  . amLODipine (NORVASC) 10 MG tablet Take 1 tablet (10 mg total) by mouth daily. 90 tablet 1  . budesonide (ENTOCORT EC) 3 MG 24 hr capsule Take 3 capsules (9 mg total) by mouth daily. 270 capsule 1  . buPROPion (WELLBUTRIN XL) 150 MG 24 hr tablet TAKE 1 TABLET BY MOUTH ONCE A DAY EARLY IN THE MORNING 90 tablet 1  . Cholecalciferol (VITAMIN D3) 1.25 MG (50000 UT) CAPS TAKE ONE CAPSULE BY MOUTH ONE TIME PER WEEK 12 capsule 1  . cloNIDine (CATAPRES - DOSED IN MG/24 HR) 0.1 mg/24hr patch Place 1 patch (0.1 mg total) onto the skin once a week. 12 patch 0  . Cyanocobalamin (B-12 COMPLIANCE INJECTION IJ) Inject as directed every 30 (thirty) days.    . fluticasone (FLONASE) 50 MCG/ACT nasal spray USE 2 SPRAYS INTO BOTH  NOSTRILS DAILY AS NEEDED  FOR ALLERGIES OR RHINITIS. 32 g 3  . Indacaterol-Glycopyrrolate (UTIBRON NEOHALER) 27.5-15.6 MCG CAPS Place 1 Act into inhaler and inhale 2 (two) times daily. (Patient taking differently: Place 1 Act into inhaler and inhale as needed. ) 60 capsule 11  . lamoTRIgine (LAMICTAL) 200 MG tablet TAKE 1 TABLET BY MOUTH AT  BEDTIME 90 tablet 1  . nebivolol (BYSTOLIC) 10 MG tablet Take 1 tablet (10 mg  total) by mouth daily. 90 tablet 1  . spironolactone (ALDACTONE) 50 MG tablet Take 1 tablet (50 mg total) by mouth daily. 90 tablet 0  . telmisartan (MICARDIS) 40 MG tablet TAKE 1 TABLET BY MOUTH  DAILY 90 tablet 1  . vitamin B-12 (CYANOCOBALAMIN) 500 MCG tablet Take 500 mcg by mouth daily.     No current facility-administered medications on file prior to visit.     Past Medical History:  Diagnosis Date  . Allergy   . Benign breast lumps   . COPD (chronic obstructive pulmonary disease) ([place])   . Crohn disease ([place])   . Depression   . Diabetes mellitus type II     borderline, md checks hemaglobin A 1 c at md office   . Hypertension   . Sleep apnea    does not use cpap since weight loss 1 and 1/2 years ago  . Small bowel obstruction ([place]) 2016  . 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 Vitamin D deficiency     Past Surgical History:  Procedure Laterality Date  . ABDOMINAL HYSTERECTOMY     1 ovary removed  . APPENDECTOMY    . APPLICATION OF WOUND VAC  11/24/2014   Procedure: APPLICATION OF WOUND VAC;  Surgeon: Donnie Mesa, MD;  Location: [facility];  Service: General;;  . BOWEL RESECTION N/A 11/24/2014   Procedure: SMALL BOWEL RESECTION;  Surgeon: Donnie Mesa, MD;  Location: [facility];  Service: General;  Laterality: N/A;  . BREAST EXCISIONAL BIOPSY Right 1972  . BREAST SURGERY Right 40 yrs ago   benign tumor removed  . LAPAROTOMY N/A 11/24/2014   Procedure: EXPLORATORY LAPAROTOMY EXTENSIVE LYSIS OF ADHESIONS SAMLL BOWEL RESECTION RIGHT HEMI COLECTOMY WOUND VAC APPLICATION. ;  Surgeon: Donnie Mesa, MD;  Location: [facility];  Service: General;  Laterality: N/A;  . LYSIS OF ADHESION  11/24/2014   Procedure: LYSIS OF ADHESION (3 HRS);  Surgeon: Donnie Mesa, MD;  Location: [facility];  Service: General;;  . PARTIAL COLECTOMY  11/24/2014   Procedure: RIGHT HEMI COLECTOMY;  Surgeon: Donnie Mesa, MD;  Location: [facility];  Service: General;;  . SMALL INTESTINE SURGERY  age 39    Social History   Socioeconomic History  . Marital status: Divorced    Spouse name: Not on file  . Number of children: 2  . Years of education: Not on file  . Highest education level: Not on file  Occupational History  . Not on file  Social Needs  . Financial resource strain: Not on file  . Food insecurity:    Worry: Not on file    Inability: Not on file  . Transportation needs:    Medical: Not on file    Non-medical: Not on file  Tobacco Use  . Smoking status: Current Every Day Smoker    Packs/day: 1.00    Years: 35.00    Pack years: 35.00    Types: Cigarettes   . Smokeless tobacco: Never Used  Substance and Sexual Activity  . Alcohol use: No  . Drug use: No  . Sexual activity: Not Currently  Lifestyle  . Physical activity:    Days per week: Not on file    Minutes per session: Not on file  . Stress: Not on file  Relationships  . Social connections:    Talks on phone: Not on file    Gets together: Not on file    Attends religious service: Not on file    Active member of  club or organization: Not on file    Attends meetings of clubs or organizations: Not on file    Relationship status: Not on file  Other Topics Concern  . Not on file  Social History Narrative  . Not on file    Family History  Problem Relation Age of Onset  . Hypertension Mother   . Ulcerative colitis Mother   . Crohn's disease Mother   . Heart disease Mother   . Kidney disease Mother   . Arthritis Father   . Brain cancer Father   . Diabetes Father   . Alcohol abuse Paternal Uncle   . Diabetes Paternal Grandmother   . Stroke Paternal Grandmother   . Heart failure Sister   . Kidney disease Sister   . Heart failure Brother   . Other Sister        prediabetes  . Kidney cancer Brother     Review of Systems  Constitutional: Positive for chills (maybe yesterday). Negative for fever.  HENT:       No dental issues or oral changes  Skin: Negative for color change (No redness), rash and wound (No open wound or discharge).       Objective:   Vitals:   05/30/18 1006  BP: (!) 162/84  Pulse: (!) 57  Resp: 16  Temp: 98.8 F (37.1 C)  SpO2: 98%   BP Readings from Last 3 Encounters:  05/30/18 (!) 162/84  05/27/18 (!) 170/100  05/20/18 (!) 146/81   Wt Readings from Last 3 Encounters:  05/30/18 225 lb (102.1 kg)  05/20/18 225 lb 15.5 oz (102.5 kg)  04/30/18 225 lb 12 oz (102.4 kg)   Body mass index is 43.94 kg/m.   Physical Exam Constitutional:      General: She is not in acute distress.    Appearance: Normal appearance. She is not ill-appearing.    Skin:    Comments: Left cheek he has a palpable lump that feels like a cyst.  No erythema, no open wound or discharge.  Nontender to palpation.  No surrounding swelling.  Neurological:     Mental Status: She is alert.            Assessment & Plan:    See Problem List for Assessment and Plan of chronic medical problems.

## 2018-05-29 NOTE — Telephone Encounter (Signed)
Patient made an appointment with Dr Quay Burow after this message was sent.  Please advise.

## 2018-05-30 ENCOUNTER — Ambulatory Visit (INDEPENDENT_AMBULATORY_CARE_PROVIDER_SITE_OTHER): Payer: Medicare Other | Admitting: Internal Medicine

## 2018-05-30 ENCOUNTER — Encounter: Payer: Self-pay | Admitting: Internal Medicine

## 2018-05-30 VITALS — BP 162/84 | HR 57 | Temp 98.8°F | Resp 16 | Ht 60.0 in | Wt 225.0 lb

## 2018-05-30 DIAGNOSIS — L729 Follicular cyst of the skin and subcutaneous tissue, unspecified: Secondary | ICD-10-CM

## 2018-05-30 NOTE — Patient Instructions (Signed)
The lump on your skin is likely a cyst.  There is no infection so no antibiotic is needed.    See your dermatologist in May.

## 2018-05-30 NOTE — Assessment & Plan Note (Signed)
Lump in the left cheek appears to be a cyst-no signs of infection and is not precancerous/cancer Discussed that this could become infected and she would need to let us know right away Okay to wait until May to see her dermatologist She will monitor closely, but no treatment needed at this time

## 2018-06-04 NOTE — Telephone Encounter (Signed)
error 

## 2018-06-18 ENCOUNTER — Other Ambulatory Visit: Payer: Self-pay | Admitting: Internal Medicine

## 2018-06-18 DIAGNOSIS — T502X5A Adverse effect of carbonic-anhydrase inhibitors, benzothiadiazides and other diuretics, initial encounter: Secondary | ICD-10-CM

## 2018-06-18 DIAGNOSIS — I10 Essential (primary) hypertension: Secondary | ICD-10-CM

## 2018-06-18 DIAGNOSIS — E876 Hypokalemia: Secondary | ICD-10-CM

## 2018-06-20 DIAGNOSIS — K5090 Crohn's disease, unspecified, without complications: Secondary | ICD-10-CM | POA: Diagnosis not present

## 2018-06-20 DIAGNOSIS — I10 Essential (primary) hypertension: Secondary | ICD-10-CM | POA: Diagnosis not present

## 2018-06-20 DIAGNOSIS — E538 Deficiency of other specified B group vitamins: Secondary | ICD-10-CM | POA: Diagnosis not present

## 2018-06-20 DIAGNOSIS — Z720 Tobacco use: Secondary | ICD-10-CM | POA: Diagnosis not present

## 2018-06-20 DIAGNOSIS — E559 Vitamin D deficiency, unspecified: Secondary | ICD-10-CM | POA: Diagnosis not present

## 2018-06-26 ENCOUNTER — Other Ambulatory Visit: Payer: Self-pay | Admitting: Internal Medicine

## 2018-06-26 ENCOUNTER — Ambulatory Visit (INDEPENDENT_AMBULATORY_CARE_PROVIDER_SITE_OTHER): Payer: Medicare Other | Admitting: Internal Medicine

## 2018-06-26 ENCOUNTER — Encounter: Payer: Self-pay | Admitting: Internal Medicine

## 2018-06-26 VITALS — BP 138/82 | HR 60 | Temp 98.8°F | Resp 16 | Ht 60.0 in | Wt 227.0 lb

## 2018-06-26 DIAGNOSIS — M5136 Other intervertebral disc degeneration, lumbar region: Secondary | ICD-10-CM

## 2018-06-26 DIAGNOSIS — M5030 Other cervical disc degeneration, unspecified cervical region: Secondary | ICD-10-CM | POA: Diagnosis not present

## 2018-06-26 DIAGNOSIS — M542 Cervicalgia: Secondary | ICD-10-CM

## 2018-06-26 DIAGNOSIS — E538 Deficiency of other specified B group vitamins: Secondary | ICD-10-CM | POA: Diagnosis not present

## 2018-06-26 DIAGNOSIS — I10 Essential (primary) hypertension: Secondary | ICD-10-CM

## 2018-06-26 MED ORDER — OXYCODONE-ACETAMINOPHEN 5-325 MG PO TABS: 1.0000 | ORAL_TABLET | Freq: Three times a day (TID) | ORAL | 0 refills | Status: DC | PRN

## 2018-06-26 MED ORDER — CYANOCOBALAMIN 1000 MCG/ML IJ SOLN
1000.0000 ug | Freq: Once | INTRAMUSCULAR | Status: AC
  Administered 2018-06-26: 1000 ug via INTRAMUSCULAR

## 2018-06-26 NOTE — Patient Instructions (Signed)

## 2018-06-26 NOTE — Progress Notes (Unsigned)
Subjective:  Patient ID: April May, female    DOB: 26-Oct-1955  Age: 63 y.o. MRN: [id number]  CC: No chief complaint on file.   HPI April May presents for ***  Outpatient Medications Prior to Visit  Medication Sig Dispense Refill  . acetaminophen ([employer] PAIN RELIEF EXTRA STRENGTH) 500 MG tablet Take 1 tablet (500 mg total) by mouth every 8 (eight) hours as needed for moderate pain. 60 tablet 0  . albuterol (PROVENTIL HFA;VENTOLIN HFA) 108 (90 Base) MCG/ACT inhaler Inhale 2 puffs into the lungs every 6 (six) hours as needed for wheezing or shortness of breath. 1 Inhaler 5  . amLODipine (NORVASC) 10 MG tablet Take 1 tablet (10 mg total) by mouth daily. 90 tablet 1  . budesonide (ENTOCORT EC) 3 MG 24 hr capsule Take 3 capsules (9 mg total) by mouth daily. 270 capsule 1  . buPROPion (WELLBUTRIN XL) 150 MG 24 hr tablet TAKE 1 TABLET BY MOUTH ONCE A DAY EARLY IN THE MORNING 90 tablet 1  . Cholecalciferol (VITAMIN D3) 1.25 MG (50000 UT) CAPS TAKE ONE CAPSULE BY MOUTH ONE TIME PER WEEK 12 capsule 1  . cloNIDine (CATAPRES - DOSED IN MG/24 HR) 0.1 mg/24hr patch Place 1 patch (0.1 mg total) onto the skin once a week. 12 patch 0  . Cyanocobalamin (B-12 COMPLIANCE INJECTION IJ) Inject as directed every 30 (thirty) days.    . fluticasone (FLONASE) 50 MCG/ACT nasal spray USE 2 SPRAYS INTO BOTH  NOSTRILS DAILY AS NEEDED  FOR ALLERGIES OR RHINITIS. 32 g 3  . furosemide (LASIX) 40 MG tablet     . Indacaterol-Glycopyrrolate (UTIBRON NEOHALER) 27.5-15.6 MCG CAPS Place 1 Act into inhaler and inhale 2 (two) times daily. (Patient taking differently: Place 1 Act into inhaler and inhale as needed. ) 60 capsule 11  . lamoTRIgine (LAMICTAL) 200 MG tablet TAKE 1 TABLET BY MOUTH AT  BEDTIME 90 tablet 1  . nebivolol (BYSTOLIC) 10 MG tablet Take 1 tablet (10 mg total) by mouth daily. 90 tablet 1  . spironolactone (ALDACTONE) 50 MG tablet Take 1 tablet (50 mg total) by mouth daily. 90 tablet 0    . telmisartan (MICARDIS) 40 MG tablet TAKE 1 TABLET BY MOUTH  DAILY 90 tablet 1  . vitamin B-12 (CYANOCOBALAMIN) 500 MCG tablet Take 500 mcg by mouth daily.    Marland Kitchen oxyCODONE-acetaminophen (PERCOCET/ROXICET) 5-325 MG tablet Take 1 tablet by mouth every 8 (eight) hours as needed for severe pain. 40 tablet 0   No facility-administered medications prior to visit.     ROS Review of Systems  Objective:  LMP 05/14/1988   BP Readings from Last 3 Encounters:  06/26/18 138/82  05/30/18 (!) 162/84  05/27/18 (!) 170/100    Wt Readings from Last 3 Encounters:  06/26/18 227 lb (103 kg)  05/30/18 225 lb (102.1 kg)  05/20/18 225 lb 15.5 oz (102.5 kg)    Physical Exam  Lab Results  Component Value Date   WBC 12.6 (H) 04/30/2018   HGB 15.2 (H) 04/30/2018   HCT 45.4 04/30/2018   PLT 288.0 04/30/2018   GLUCOSE 93 04/30/2018   CHOL 114 04/30/2018   TRIG 111.0 04/30/2018   HDL 48.10 04/30/2018   LDLCALC 44 04/30/2018   ALT 15 09/18/2016   AST 14 09/18/2016   NA 144 04/30/2018   K 3.1 (L) 04/30/2018   CL 102 04/30/2018   CREATININE 0.81 04/30/2018   BUN 8 04/30/2018   CO2 34 (H) 04/30/2018   TSH  1.50 01/08/2018   HGBA1C 5.7 04/30/2018   MICROALBUR 10 01/31/2017    No results found.  Assessment & Plan:   Diagnoses and all orders for this visit:  DDD (degenerative disc disease), cervical -     oxyCODONE-acetaminophen (PERCOCET/ROXICET) 5-325 MG tablet; Take 1 tablet by mouth every 8 (eight) hours as needed for severe pain.  DDD (degenerative disc disease), lumbar -     oxyCODONE-acetaminophen (PERCOCET/ROXICET) 5-325 MG tablet; Take 1 tablet by mouth every 8 (eight) hours as needed for severe pain.   I am having April May maintain her fluticasone, albuterol, Indacaterol-Glycopyrrolate, vitamin B-12, Cyanocobalamin (B-12 COMPLIANCE INJECTION IJ), budesonide, lamoTRIgine, buPROPion, telmisartan, cloNIDine, Vitamin D3, acetaminophen, amLODipine, spironolactone,  nebivolol, furosemide, and oxyCODONE-acetaminophen.  Meds ordered this encounter  Medications  . oxyCODONE-acetaminophen (PERCOCET/ROXICET) 5-325 MG tablet    Sig: Take 1 tablet by mouth every 8 (eight) hours as needed for severe pain.    Dispense:  40 tablet    Refill:  0     Follow-up: No follow-ups on file.  Scarlette Calico, MD

## 2018-06-26 NOTE — Progress Notes (Signed)
Subjective:  Patient ID: April May, female    DOB: 04-19-1956  Age: 63 y.o. MRN: [id number]  CC: Hypertension and Back Pain   HPI [facility] presents for f/up -she complains of chronic, intermittent nonradiating neck and back pain.  She cannot take NSAIDs due to a history of renal disease.  She gets adequate symptom relief with the occasional dose of Percocet.  She denies paresthesias in her arms or legs.  She recently saw a nephrologist and tells me with the addition of a loop diuretic her blood pressures have been well controlled.  Outpatient Medications Prior to Visit  Medication Sig Dispense Refill  . albuterol (PROVENTIL HFA;VENTOLIN HFA) 108 (90 Base) MCG/ACT inhaler Inhale 2 puffs into the lungs every 6 (six) hours as needed for wheezing or shortness of breath. 1 Inhaler 5  . amLODipine (NORVASC) 10 MG tablet Take 1 tablet (10 mg total) by mouth daily. 90 tablet 1  . budesonide (ENTOCORT EC) 3 MG 24 hr capsule Take 3 capsules (9 mg total) by mouth daily. 270 capsule 1  . buPROPion (WELLBUTRIN XL) 150 MG 24 hr tablet TAKE 1 TABLET BY MOUTH ONCE A DAY EARLY IN THE MORNING 90 tablet 1  . Cholecalciferol (VITAMIN D3) 1.25 MG (50000 UT) CAPS TAKE ONE CAPSULE BY MOUTH ONE TIME PER WEEK 12 capsule 1  . cloNIDine (CATAPRES - DOSED IN MG/24 HR) 0.1 mg/24hr patch Place 1 patch (0.1 mg total) onto the skin once a week. 12 patch 0  . Cyanocobalamin (B-12 COMPLIANCE INJECTION IJ) Inject as directed every 30 (thirty) days.    . fluticasone (FLONASE) 50 MCG/ACT nasal spray USE 2 SPRAYS INTO BOTH  NOSTRILS DAILY AS NEEDED  FOR ALLERGIES OR RHINITIS. 32 g 3  . furosemide (LASIX) 40 MG tablet     . Indacaterol-Glycopyrrolate (UTIBRON NEOHALER) 27.5-15.6 MCG CAPS Place 1 Act into inhaler and inhale 2 (two) times daily. (Patient taking differently: Place 1 Act into inhaler and inhale as needed. ) 60 capsule 11  . lamoTRIgine (LAMICTAL) 200 MG tablet TAKE 1 TABLET BY MOUTH AT   BEDTIME 90 tablet 1  . nebivolol (BYSTOLIC) 10 MG tablet Take 1 tablet (10 mg total) by mouth daily. 90 tablet 1  . spironolactone (ALDACTONE) 50 MG tablet Take 1 tablet (50 mg total) by mouth daily. 90 tablet 0  . telmisartan (MICARDIS) 40 MG tablet TAKE 1 TABLET BY MOUTH  DAILY 90 tablet 1  . vitamin B-12 (CYANOCOBALAMIN) 500 MCG tablet Take 500 mcg by mouth daily.    Marland Kitchen acetaminophen ([employer] PAIN RELIEF EXTRA STRENGTH) 500 MG tablet Take 1 tablet (500 mg total) by mouth every 8 (eight) hours as needed for moderate pain. 60 tablet 0   No facility-administered medications prior to visit.     ROS Review of Systems  Constitutional: Negative.  Negative for appetite change, diaphoresis, fatigue and unexpected weight change.  HENT: Negative.   Eyes: Negative for visual disturbance.  Respiratory: Negative for cough, chest tightness, shortness of breath and wheezing.   Cardiovascular: Negative for chest pain, palpitations and leg swelling.  Gastrointestinal: Negative for abdominal pain, blood in stool, constipation, diarrhea and vomiting.  Endocrine: Negative.   Genitourinary: Negative.  Negative for difficulty urinating and dysuria.  Musculoskeletal: Positive for back pain and neck pain. Negative for arthralgias and myalgias.  Skin: Negative.  Negative for color change and pallor.  Neurological: Negative.  Negative for dizziness, weakness, light-headedness and numbness.  Hematological: Negative for adenopathy. Does not bruise/bleed  easily.  Psychiatric/Behavioral: Negative.     Objective:  BP 138/82 (BP Location: Left Arm, Patient Position: Sitting, Cuff Size: Large)   Pulse 60   Temp 98.8 F (37.1 C) (Oral)   Resp 16   Ht 5' (1.524 m)   Wt 227 lb (103 kg)   LMP 05/14/1988   SpO2 95%   BMI 44.33 kg/m   BP Readings from Last 3 Encounters:  06/26/18 138/82  05/30/18 (!) 162/84  05/27/18 (!) 170/100    Wt Readings from Last 3 Encounters:  06/26/18 227 lb (103 kg)  05/30/18 225 lb  (102.1 kg)  05/20/18 225 lb 15.5 oz (102.5 kg)    Physical Exam Vitals signs reviewed.  Constitutional:      Appearance: She is obese. She is not ill-appearing or diaphoretic.  HENT:     Nose: Nose normal. No congestion or rhinorrhea.     Mouth/Throat:     Mouth: Mucous membranes are moist.     Pharynx: Oropharynx is clear. No oropharyngeal exudate or posterior oropharyngeal erythema.  Eyes:     General: No scleral icterus.    Conjunctiva/sclera: Conjunctivae normal.  Neck:     Musculoskeletal: Normal range of motion and neck supple. No neck rigidity or muscular tenderness.     Vascular: No carotid bruit.  Cardiovascular:     Rate and Rhythm: Normal rate and regular rhythm.     Pulses: Normal pulses.     Heart sounds: No murmur. No gallop.   Pulmonary:     Effort: Pulmonary effort is normal. No respiratory distress.     Breath sounds: No stridor. No wheezing, rhonchi or rales.  Abdominal:     General: Abdomen is flat. Bowel sounds are normal. There is no distension.     Palpations: There is no mass.     Tenderness: There is no abdominal tenderness. There is no guarding.  Musculoskeletal: Normal range of motion.        General: No swelling.     Right lower leg: No edema.     Left lower leg: No edema.  Lymphadenopathy:     Cervical: No cervical adenopathy.  Skin:    General: Skin is dry.     Coloration: Skin is not pale.  Neurological:     General: No focal deficit present.     Mental Status: She is oriented to person, place, and time. Mental status is at baseline.     Lab Results  Component Value Date   WBC 12.6 (H) 04/30/2018   HGB 15.2 (H) 04/30/2018   HCT 45.4 04/30/2018   PLT 288.0 04/30/2018   GLUCOSE 93 04/30/2018   CHOL 114 04/30/2018   TRIG 111.0 04/30/2018   HDL 48.10 04/30/2018   LDLCALC 44 04/30/2018   ALT 15 09/18/2016   AST 14 09/18/2016   NA 144 04/30/2018   K 3.1 (L) 04/30/2018   CL 102 04/30/2018   CREATININE 0.81 04/30/2018   BUN 8  04/30/2018   CO2 34 (H) 04/30/2018   TSH 1.50 01/08/2018   HGBA1C 5.7 04/30/2018   MICROALBUR 10 01/31/2017    No results found.  Assessment & Plan:   Karmen was seen today for hypertension and back pain.  Diagnoses and all orders for this visit:  Vitamin B12 deficiency -     cyanocobalamin ((VITAMIN B-12)) injection 1,000 mcg  Malignant hypertension- Her BP is adequately well controlled -     Basic metabolic panel; Future  Neck pain, bilateral  DDD (degenerative  disc disease), cervical -     Discontinue: oxyCODONE-acetaminophen (PERCOCET/ROXICET) 5-325 MG tablet; Take 1 tablet by mouth every 8 (eight) hours as needed for severe pain.  DDD (degenerative disc disease), lumbar -     Discontinue: oxyCODONE-acetaminophen (PERCOCET/ROXICET) 5-325 MG tablet; Take 1 tablet by mouth every 8 (eight) hours as needed for severe pain.   I have discontinued Annmargaret W. Mohon's acetaminophen. I am also having her maintain her fluticasone, albuterol, Indacaterol-Glycopyrrolate, vitamin B-12, Cyanocobalamin (B-12 COMPLIANCE INJECTION IJ), budesonide, lamoTRIgine, buPROPion, telmisartan, cloNIDine, Vitamin D3, amLODipine, spironolactone, nebivolol, and furosemide. We administered cyanocobalamin.  Meds ordered this encounter  Medications  . cyanocobalamin ((VITAMIN B-12)) injection 1,000 mcg  . DISCONTD: oxyCODONE-acetaminophen (PERCOCET/ROXICET) 5-325 MG tablet    Sig: Take 1 tablet by mouth every 8 (eight) hours as needed for severe pain.    Dispense:  40 tablet    Refill:  0     Follow-up: Return in about 6 months (around 12/25/2018).  Scarlette Calico, MD

## 2018-07-18 DIAGNOSIS — I10 Essential (primary) hypertension: Secondary | ICD-10-CM | POA: Diagnosis not present

## 2018-07-18 DIAGNOSIS — Z720 Tobacco use: Secondary | ICD-10-CM | POA: Diagnosis not present

## 2018-07-18 DIAGNOSIS — K5090 Crohn's disease, unspecified, without complications: Secondary | ICD-10-CM | POA: Diagnosis not present

## 2018-07-18 DIAGNOSIS — E785 Hyperlipidemia, unspecified: Secondary | ICD-10-CM | POA: Diagnosis not present

## 2018-07-19 ENCOUNTER — Other Ambulatory Visit: Payer: Self-pay | Admitting: Internal Medicine

## 2018-07-19 DIAGNOSIS — I10 Essential (primary) hypertension: Secondary | ICD-10-CM

## 2018-07-22 ENCOUNTER — Encounter: Payer: Self-pay | Admitting: Internal Medicine

## 2018-07-25 ENCOUNTER — Ambulatory Visit (INDEPENDENT_AMBULATORY_CARE_PROVIDER_SITE_OTHER): Payer: Medicare Other | Admitting: *Deleted

## 2018-07-25 ENCOUNTER — Other Ambulatory Visit: Payer: Self-pay

## 2018-07-25 ENCOUNTER — Encounter: Payer: Self-pay | Admitting: Internal Medicine

## 2018-07-25 ENCOUNTER — Other Ambulatory Visit (INDEPENDENT_AMBULATORY_CARE_PROVIDER_SITE_OTHER): Payer: Medicare Other

## 2018-07-25 DIAGNOSIS — E538 Deficiency of other specified B group vitamins: Secondary | ICD-10-CM

## 2018-07-25 DIAGNOSIS — I10 Essential (primary) hypertension: Secondary | ICD-10-CM

## 2018-07-25 LAB — BASIC METABOLIC PANEL
BUN: 10 mg/dL (ref 6–23)
CHLORIDE: 101 meq/L (ref 96–112)
CO2: 30 mEq/L (ref 19–32)
CREATININE: 0.8 mg/dL (ref 0.40–1.20)
Calcium: 9.7 mg/dL (ref 8.4–10.5)
GFR: 87.73 mL/min (ref >60.00)
Glucose, Bld: 88 mg/dL (ref 70–99)
Potassium: 2.9 mEq/L — ABNORMAL LOW (ref 3.5–5.1)
Sodium: 145 mEq/L (ref 135–145)

## 2018-07-25 MED ORDER — CYANOCOBALAMIN 1000 MCG/ML IJ SOLN
1000.0000 ug | Freq: Once | INTRAMUSCULAR | Status: AC
  Administered 2018-07-25: 1000 ug via INTRAMUSCULAR

## 2018-07-25 NOTE — Progress Notes (Signed)
b12 Injection given.   April May J April Inabinet, MD  

## 2018-08-04 ENCOUNTER — Other Ambulatory Visit: Payer: Self-pay | Admitting: Internal Medicine

## 2018-08-04 ENCOUNTER — Other Ambulatory Visit: Payer: Self-pay | Admitting: Gastroenterology

## 2018-08-04 DIAGNOSIS — T502X5A Adverse effect of carbonic-anhydrase inhibitors, benzothiadiazides and other diuretics, initial encounter: Secondary | ICD-10-CM

## 2018-08-04 DIAGNOSIS — Z1231 Encounter for screening mammogram for malignant neoplasm of breast: Secondary | ICD-10-CM

## 2018-08-04 DIAGNOSIS — F321 Major depressive disorder, single episode, moderate: Secondary | ICD-10-CM

## 2018-08-04 DIAGNOSIS — I10 Essential (primary) hypertension: Secondary | ICD-10-CM

## 2018-08-04 DIAGNOSIS — E876 Hypokalemia: Secondary | ICD-10-CM

## 2018-08-19 ENCOUNTER — Other Ambulatory Visit: Payer: Self-pay

## 2018-08-19 ENCOUNTER — Ambulatory Visit (INDEPENDENT_AMBULATORY_CARE_PROVIDER_SITE_OTHER): Payer: Medicare Other | Admitting: Gastroenterology

## 2018-08-19 ENCOUNTER — Encounter: Payer: Self-pay | Admitting: Gastroenterology

## 2018-08-19 VITALS — Ht 60.0 in | Wt 240.0 lb

## 2018-08-19 DIAGNOSIS — K50019 Crohn's disease of small intestine with unspecified complications: Secondary | ICD-10-CM | POA: Diagnosis not present

## 2018-08-19 DIAGNOSIS — R197 Diarrhea, unspecified: Secondary | ICD-10-CM | POA: Diagnosis not present

## 2018-08-19 NOTE — Progress Notes (Signed)
    History of Present Illness: This is a 63 year old female with Crohn's enteritis.  She is status post terminal ileal and right colon resection in 2016.  She has been maintained on Entocort 9 mg daily with good control of her symptoms.  She states her bowel habits vary slightly between 2-3 solid bowel movements each day to occasionally 4 looser bowel movements each day.  She feels certain foods can lead to looser bowel movements that she generally tries to avoid.  No abdominal pain, fevers, weight loss, hematochezia.  Current Medications, Allergies, Past Medical History, Past Surgical History, Family History and Social History were reviewed in [employer] record.  Physical Exam: Telemedicine service - not performed   Assessment and Recommendations:  1.  Crohn's ileitis.  Her intermittent diarrhea could be due to incomplete control of Crohn's disease, prior TI/colon surgery, food intolerances or other causes.  Status post terminal ileal and right colon resection in 2016.  Ileitis was noted on her last colonoscopy in 2019 prior to starting Entocort.  Patient would like to know whether Entocort is successfully controlling her disease and is interested in another colonoscopy.  Continue Entocort 9 mg daily.  After the COVID-19 pandemic restrictions have eased we plan for return office visit in 3 months with consideration of colonoscopy at that time.    2.  B12 deficiency.  She continues with B12 replacement by her PCP.  3.  Hypokalemia noted on recent blood work.  Follow-up with PCP.   These services were provided via telemedicine.  The patient was at home in private location and the provider was in the office, alone  Patient did consent for this telemedicine visit and is aware of possible charges for this service.  The other person participating in the telemedicine service was Marlon Pel, [place] who reviewed medications, allergies, past history and completed AVS.  Time  spent on call: 10 minutes Time spend on call, reviewing records, coordinating care and CMA portion: 17 minutes

## 2018-08-19 NOTE — Patient Instructions (Signed)
Continue Entocort 9 mg daily.   Please follow up with Dr. Fuller Plan in 3 months after covid-19 restrictions have been lifted.   Thank you for choosing me and [facility].  Pricilla Riffle. Dagoberto Ligas., MD., Marval Regal

## 2018-08-25 ENCOUNTER — Other Ambulatory Visit: Payer: Self-pay | Admitting: Internal Medicine

## 2018-08-27 DIAGNOSIS — Z720 Tobacco use: Secondary | ICD-10-CM | POA: Diagnosis not present

## 2018-08-27 DIAGNOSIS — E876 Hypokalemia: Secondary | ICD-10-CM | POA: Diagnosis not present

## 2018-08-27 DIAGNOSIS — I10 Essential (primary) hypertension: Secondary | ICD-10-CM | POA: Diagnosis not present

## 2018-08-27 DIAGNOSIS — K5090 Crohn's disease, unspecified, without complications: Secondary | ICD-10-CM | POA: Diagnosis not present

## 2018-09-01 DIAGNOSIS — E876 Hypokalemia: Secondary | ICD-10-CM | POA: Diagnosis not present

## 2018-09-15 DIAGNOSIS — D485 Neoplasm of uncertain behavior of skin: Secondary | ICD-10-CM | POA: Diagnosis not present

## 2018-09-15 DIAGNOSIS — L821 Other seborrheic keratosis: Secondary | ICD-10-CM | POA: Diagnosis not present

## 2018-09-15 DIAGNOSIS — D2271 Melanocytic nevi of right lower limb, including hip: Secondary | ICD-10-CM | POA: Diagnosis not present

## 2018-09-15 DIAGNOSIS — Z1283 Encounter for screening for malignant neoplasm of skin: Secondary | ICD-10-CM | POA: Diagnosis not present

## 2018-09-18 ENCOUNTER — Ambulatory Visit: Payer: Medicare Other

## 2018-09-22 ENCOUNTER — Other Ambulatory Visit: Payer: Self-pay

## 2018-09-22 ENCOUNTER — Telehealth: Payer: Self-pay

## 2018-09-22 ENCOUNTER — Ambulatory Visit: Payer: Medicare Other | Admitting: Gastroenterology

## 2018-09-22 NOTE — Telephone Encounter (Signed)
Covid-19 travel screening questions  Have you traveled in the last 14 days? No If yes where?   Do you now or have you had a fever in the last 14 days? No  Do you have any respiratory symptoms of shortness of breath or cough now or in the last 14 days? No  Do you have a medical history of Congestive Heart Failure? No  Do you have a medical history of lung disease? No  Do you have any family members or close contacts with diagnosed or suspected Covid-19? No        

## 2018-09-24 DIAGNOSIS — K5090 Crohn's disease, unspecified, without complications: Secondary | ICD-10-CM | POA: Diagnosis not present

## 2018-09-24 DIAGNOSIS — E876 Hypokalemia: Secondary | ICD-10-CM | POA: Diagnosis not present

## 2018-09-24 DIAGNOSIS — I10 Essential (primary) hypertension: Secondary | ICD-10-CM | POA: Diagnosis not present

## 2018-09-26 ENCOUNTER — Other Ambulatory Visit: Payer: Self-pay | Admitting: Internal Medicine

## 2018-09-26 DIAGNOSIS — E559 Vitamin D deficiency, unspecified: Secondary | ICD-10-CM

## 2018-10-02 ENCOUNTER — Other Ambulatory Visit: Payer: Self-pay

## 2018-10-02 ENCOUNTER — Ambulatory Visit (INDEPENDENT_AMBULATORY_CARE_PROVIDER_SITE_OTHER): Payer: Medicare Other

## 2018-10-02 DIAGNOSIS — E538 Deficiency of other specified B group vitamins: Secondary | ICD-10-CM | POA: Diagnosis not present

## 2018-10-02 MED ORDER — CYANOCOBALAMIN 1000 MCG/ML IJ SOLN
1000.0000 ug | Freq: Once | INTRAMUSCULAR | Status: AC
  Administered 2018-10-02: 1000 ug via INTRAMUSCULAR

## 2018-10-02 NOTE — Progress Notes (Signed)
I have reviewed and agree.

## 2018-10-05 ENCOUNTER — Other Ambulatory Visit: Payer: Self-pay | Admitting: Internal Medicine

## 2018-10-05 DIAGNOSIS — I10 Essential (primary) hypertension: Secondary | ICD-10-CM

## 2018-10-08 ENCOUNTER — Telehealth: Payer: Self-pay

## 2018-10-08 ENCOUNTER — Telehealth: Payer: Self-pay | Admitting: Gastroenterology

## 2018-10-08 NOTE — Telephone Encounter (Signed)
Covid-19 travel screening questions  Have you traveled in the last 14 days? No If yes where?   Do you now or have you had a fever in the last 14 days? No  Do you have any respiratory symptoms of shortness of breath or cough now or in the last 14 days? No  Do you have a medical history of Congestive Heart Failure? No  Do you have a medical history of lung disease? No  Do you have any family members or close contacts with diagnosed or suspected Covid-19? No        

## 2018-10-08 NOTE — Telephone Encounter (Signed)
Patient reports a one week history of incomplete evacuation.  She feels there is a "bump or lump" keeping her from having a BM.  She is only able to have very small frequent BM.  She will come in for an office visit on 10/10/18 10:30 with Dr. Fuller Plan

## 2018-10-10 ENCOUNTER — Ambulatory Visit (INDEPENDENT_AMBULATORY_CARE_PROVIDER_SITE_OTHER): Payer: Medicare Other | Admitting: Gastroenterology

## 2018-10-10 ENCOUNTER — Encounter: Payer: Self-pay | Admitting: Gastroenterology

## 2018-10-10 ENCOUNTER — Other Ambulatory Visit (INDEPENDENT_AMBULATORY_CARE_PROVIDER_SITE_OTHER): Payer: Medicare Other

## 2018-10-10 ENCOUNTER — Other Ambulatory Visit: Payer: Self-pay

## 2018-10-10 VITALS — BP 122/80 | HR 54 | Temp 98.6°F | Ht 60.0 in | Wt 238.5 lb

## 2018-10-10 DIAGNOSIS — K50019 Crohn's disease of small intestine with unspecified complications: Secondary | ICD-10-CM

## 2018-10-10 DIAGNOSIS — K5900 Constipation, unspecified: Secondary | ICD-10-CM

## 2018-10-10 LAB — CBC
HCT: 43.4 % (ref 36.0–46.0)
Hemoglobin: 14.9 g/dL (ref 12.0–15.0)
MCHC: 34.2 g/dL (ref 30.0–36.0)
MCV: 96.7 fl (ref 78.0–100.0)
Platelets: 273 10*3/uL (ref 150.0–400.0)
RBC: 4.49 Mil/uL (ref 3.87–5.11)
RDW: 15 % (ref 11.5–15.5)
WBC: 16.5 10*3/uL — ABNORMAL HIGH (ref 4.0–10.5)

## 2018-10-10 LAB — COMPREHENSIVE METABOLIC PANEL
ALT: 25 U/L (ref 0–35)
AST: 17 U/L (ref 0–37)
Albumin: 4.1 g/dL (ref 3.5–5.2)
Alkaline Phosphatase: 45 U/L (ref 39–117)
BUN: 9 mg/dL (ref 6–23)
CO2: 35 mEq/L — ABNORMAL HIGH (ref 19–32)
Calcium: 10 mg/dL (ref 8.4–10.5)
Chloride: 99 mEq/L (ref 96–112)
Creatinine, Ser: 0.85 mg/dL (ref 0.40–1.20)
GFR: 81.75 mL/min (ref >60.00)
Glucose, Bld: 88 mg/dL (ref 70–99)
Potassium: 2.9 mEq/L — ABNORMAL LOW (ref 3.5–5.1)
Sodium: 143 mEq/L (ref 135–145)
Total Bilirubin: 0.5 mg/dL (ref 0.2–1.2)
Total Protein: 7.3 g/dL (ref 6.0–8.3)

## 2018-10-10 LAB — TSH: TSH: 3.68 u[IU]/mL (ref 0.35–4.50)

## 2018-10-10 LAB — VITAMIN B12: Vitamin B-12: >1500 pg/mL — ABNORMAL HIGH (ref 211–911)

## 2018-10-10 LAB — HIGH SENSITIVITY CRP: CRP, High Sensitivity: 4.26 mg/L (ref 0.000–5.000)

## 2018-10-10 LAB — SEDIMENTATION RATE: Sed Rate: 20 mm/hr (ref 0–30)

## 2018-10-10 NOTE — Patient Instructions (Signed)
Your provider has requested that you go to the basement level for lab work before leaving today. Press "B" on the elevator. The lab is located at the first door on the left as you exit the elevator.  Take OTC Miralax daily Use Preparation H Suppositories per rectum at bedtime as needed  Normal BMI (Body Mass Index- based on height and weight) is between 19 and 25. Your BMI today is Body mass index is 46.58 kg/m. Marland Kitchen Please consider follow up  regarding your BMI with your Primary Care Provider.  Thank you for entrusting me with your care and choosing [facility].  Dr Fuller Plan

## 2018-10-10 NOTE — Progress Notes (Signed)
    History of Present Illness: This is a 63 year old female with a history of Crohn's ileitis status post TI and right colon resection in 2016.  She states she has had firmer stools and with sensation of incomplete bowel movements over the past week which is unusual for her.  She relates occasional small protrusion from her rectum which she presumes to be a hemorrhoid it reduces easily.  No other gastrointestinal complaints.  Colonoscopy 09/2017 - A few erosions in the neo-terminal ileum. Biopsied. (active ileitis) - Patent end-to-side ileo-colonic anastomosis, characterized by erosion. - The examination was otherwise normal on direct and retroflexion views.  Current Medications, Allergies, Past Medical History, Past Surgical History, Family History and Social History were reviewed in [employer] record.  Physical Exam: General: Well developed, well nourished, no acute distress Head: Normocephalic and atraumatic Eyes:  sclerae anicteric, EOMI Ears: Normal auditory acuity Mouth: No deformity or lesions Lungs: Clear throughout to auscultation Heart: Regular rate and rhythm; no murmurs, rubs or bruits Abdomen: Soft, non tender and non distended. No masses, hepatosplenomegaly or hernias noted. Normal Bowel sounds Rectal: no lesions, mild anal canal tenderness, no stool in vault, heme neg mucous  Musculoskeletal: Symmetrical with no gross deformities  Pulses:  Normal pulses noted Extremities: No clubbing, cyanosis, edema or deformities noted Neurological: Alert oriented x 4, grossly nonfocal Psychological:  Alert and cooperative. Normal mood and affect   Assessment and Recommendations:  1. Crohn's ileitis.  Status post TI and right colon resection in 2016.  Symptoms are well controlled.  Continue budesonide 9 mg daily.  She declined biologics recommended last year. CMP, CBC, ESR, CRP, TSH, B12 today. REV in 6 months.   2.  Mild constipation and suspected internal  hemorrhoids.  MiraLAX daily as needed to achieve at complete bowel movement daily. Avoid straining. Preparation H suppositories at bedtime for 3 to 4 days then prn. Call if symptoms not well controlled.   3. B12 deficiency, per PCP.

## 2018-10-13 ENCOUNTER — Encounter: Payer: Self-pay | Admitting: Internal Medicine

## 2018-10-23 ENCOUNTER — Other Ambulatory Visit: Payer: Self-pay

## 2018-10-23 ENCOUNTER — Ambulatory Visit
Admission: RE | Admit: 2018-10-23 | Discharge: 2018-10-23 | Disposition: A | Discharge status: Home / Self Care | Payer: Medicare Other | Source: Ambulatory Visit | Attending: Internal Medicine | Admitting: Internal Medicine

## 2018-10-23 DIAGNOSIS — Z1231 Encounter for screening mammogram for malignant neoplasm of breast: Secondary | ICD-10-CM | POA: Diagnosis not present

## 2018-10-24 DIAGNOSIS — I10 Essential (primary) hypertension: Secondary | ICD-10-CM | POA: Diagnosis not present

## 2018-10-24 DIAGNOSIS — E876 Hypokalemia: Secondary | ICD-10-CM | POA: Diagnosis not present

## 2018-10-24 DIAGNOSIS — K5090 Crohn's disease, unspecified, without complications: Secondary | ICD-10-CM | POA: Diagnosis not present

## 2018-10-24 LAB — HM MAMMOGRAPHY

## 2018-11-05 ENCOUNTER — Ambulatory Visit (INDEPENDENT_AMBULATORY_CARE_PROVIDER_SITE_OTHER): Payer: Medicare Other

## 2018-11-05 DIAGNOSIS — E538 Deficiency of other specified B group vitamins: Secondary | ICD-10-CM | POA: Diagnosis not present

## 2018-11-05 MED ORDER — CYANOCOBALAMIN 1000 MCG/ML IJ SOLN
1000.0000 ug | Freq: Once | INTRAMUSCULAR | Status: AC
  Administered 2018-11-05: 1000 ug via INTRAMUSCULAR

## 2018-11-05 NOTE — Progress Notes (Signed)
I have reviewed and agree.

## 2018-11-07 DIAGNOSIS — E876 Hypokalemia: Secondary | ICD-10-CM | POA: Diagnosis not present

## 2018-12-05 DIAGNOSIS — I10 Essential (primary) hypertension: Secondary | ICD-10-CM | POA: Diagnosis not present

## 2018-12-05 DIAGNOSIS — K5090 Crohn's disease, unspecified, without complications: Secondary | ICD-10-CM | POA: Diagnosis not present

## 2018-12-05 DIAGNOSIS — E876 Hypokalemia: Secondary | ICD-10-CM | POA: Diagnosis not present

## 2018-12-12 ENCOUNTER — Other Ambulatory Visit: Payer: Self-pay | Admitting: Internal Medicine

## 2018-12-12 DIAGNOSIS — I10 Essential (primary) hypertension: Secondary | ICD-10-CM

## 2018-12-15 ENCOUNTER — Other Ambulatory Visit: Payer: Self-pay

## 2018-12-15 ENCOUNTER — Ambulatory Visit (INDEPENDENT_AMBULATORY_CARE_PROVIDER_SITE_OTHER): Payer: Medicare Other

## 2018-12-15 DIAGNOSIS — E538 Deficiency of other specified B group vitamins: Secondary | ICD-10-CM

## 2018-12-15 MED ORDER — CYANOCOBALAMIN 1000 MCG/ML IJ SOLN
1000.0000 ug | Freq: Once | INTRAMUSCULAR | Status: AC
  Administered 2018-12-15: 1000 ug via INTRAMUSCULAR

## 2018-12-15 NOTE — Progress Notes (Signed)
I have reviewed and agree.

## 2018-12-16 ENCOUNTER — Encounter: Payer: Self-pay | Admitting: Obstetrics & Gynecology

## 2018-12-16 ENCOUNTER — Ambulatory Visit (INDEPENDENT_AMBULATORY_CARE_PROVIDER_SITE_OTHER): Payer: Medicare Other | Admitting: Obstetrics & Gynecology

## 2018-12-16 VITALS — BP 130/80 | Ht 60.0 in | Wt 236.0 lb

## 2018-12-16 DIAGNOSIS — Z13820 Encounter for screening for osteoporosis: Secondary | ICD-10-CM | POA: Diagnosis not present

## 2018-12-16 DIAGNOSIS — Z6841 Body Mass Index (BMI) 40.0 and over, adult: Secondary | ICD-10-CM

## 2018-12-16 DIAGNOSIS — Z01419 Encounter for gynecological examination (general) (routine) without abnormal findings: Secondary | ICD-10-CM | POA: Diagnosis not present

## 2018-12-16 DIAGNOSIS — Z90710 Acquired absence of both cervix and uterus: Secondary | ICD-10-CM | POA: Diagnosis not present

## 2018-12-16 DIAGNOSIS — Z780 Asymptomatic menopausal state: Secondary | ICD-10-CM

## 2018-12-16 DIAGNOSIS — F17200 Nicotine dependence, unspecified, uncomplicated: Secondary | ICD-10-CM

## 2018-12-16 DIAGNOSIS — Z1272 Encounter for screening for malignant neoplasm of vagina: Secondary | ICD-10-CM

## 2018-12-16 LAB — HM PAP SMEAR

## 2018-12-16 NOTE — Addendum Note (Signed)
Addended by: Thurnell Garbe A on: 12/16/2018 02:35 PM   Modules accepted: Orders

## 2018-12-16 NOTE — Patient Instructions (Signed)
1. Encounter for Papanicolaou smear of vagina as part of routine gynecological examination Gynecologic exam status post total hysterectomy and menopause.  Pap reflex done on the vaginal vault.  Breast exam normal.  Screening mammogram June 2020 was negative.  Health labs with family physician.  Followed for Crohn's disease, chronic hypertension, diabetes mellitus type 2.  2. S/P total hysterectomy  3. Postmenopause Well on no hormone replacement therapy.  4. Screening for osteoporosis We will schedule a bone density here now.  Vitamin D supplements, calcium intake of 1200 mg daily and regular weightbearing physical activity is recommended. - DG Bone Density; Future  5. TOBACCO ABUSE Strongly recommend quitting tobacco smoking.  6. Class 3 severe obesity due to excess calories with serious comorbidity and body mass index (BMI) of 45.0 to 49.9 in adult [facility]) Recommend a lower calorie/carb diet such as [employer].  Aerobic physical activities 5 times a week and weight lifting every 2 days.  April May, it was a pleasure meeting you today!  I will inform you of your results as soon as they are available.

## 2018-12-16 NOTE — Progress Notes (Signed)
April May Mar 07, 1956 [id number]   History:    63 y.o. G2P2L2 Single  RP:  New patient presenting for annual gyn exam   HPI: S/P Total Hysterectomy.  Menopause, well on no HRT.  No pelvic pain.  Abstinent.  Urine normal.  Crohn's disease.  Breasts normal.  BMI 46.09.  Not exercising regularly.  Cigarette smoker.  Health labs Fam. MD.  DM type 2, cHTN.  Past medical history,surgical history, family history and social history were all reviewed and documented in the EPIC chart.  Gynecologic History Patient's last menstrual period was 05/14/1988. Contraception: status post hysterectomy Last Pap: 12/2008. Results were: normal Last mammogram: 10/2018. Results were: Negative Bone Density: 12/2008.  Will schedule here now. Colonoscopy: 09/2017  Obstetric History OB History  Gravida Para Term Preterm AB Living  2 2       2   SAB TAB Ectopic Multiple Live Births               # Outcome Date GA Lbr Len/2nd Weight Sex Delivery Anes PTL Lv  2 Para           1 Para              ROS: A ROS was performed and pertinent positives and negatives are included in the history.  GENERAL: No fevers or chills. HEENT: No change in vision, no earache, sore throat or sinus congestion. NECK: No pain or stiffness. CARDIOVASCULAR: No chest pain or pressure. No palpitations. PULMONARY: No shortness of breath, cough or wheeze. GASTROINTESTINAL: No abdominal pain, nausea, vomiting or diarrhea, melena or bright red blood per rectum. GENITOURINARY: No urinary frequency, urgency, hesitancy or dysuria. MUSCULOSKELETAL: No joint or muscle pain, no back pain, no recent trauma. DERMATOLOGIC: No rash, no itching, no lesions. ENDOCRINE: No polyuria, polydipsia, no heat or cold intolerance. No recent change in weight. HEMATOLOGICAL: No anemia or easy bruising or bleeding. NEUROLOGIC: No headache, seizures, numbness, tingling or weakness. PSYCHIATRIC: No depression, no loss of interest in normal activity or change  in sleep pattern.     Exam:   BP 130/80   Ht 5' (1.524 m)   Wt 236 lb (107 kg)   LMP 05/14/1988   BMI 46.09 kg/m   Body mass index is 46.09 kg/m.  General appearance : Well developed well nourished female. No acute distress HEENT: Eyes: no retinal hemorrhage or exudates,  Neck supple, trachea midline, no carotid bruits, no thyroidmegaly Lungs: Clear to auscultation, no rhonchi or wheezes, or rib retractions  Heart: Regular rate and rhythm, no murmurs or gallops Breast:Examined in sitting and supine position were symmetrical in appearance, no palpable masses or tenderness,  no skin retraction, no nipple inversion, no nipple discharge, no skin discoloration, no axillary or supraclavicular lymphadenopathy Abdomen: no palpable masses or tenderness, no rebound or guarding Extremities: no edema or skin discoloration or tenderness  Pelvic: Vulva: Normal             Vagina: No gross lesions or discharge.  Pap reflex done.  Cervix/Uterus absent  Adnexa  Without masses or tenderness  Anus: Normal   Assessment/Plan:  63 y.o. female for annual exam   1. Encounter for Papanicolaou smear of vagina as part of routine gynecological examination Gynecologic exam status post total hysterectomy and menopause.  Pap reflex done on the vaginal vault.  Breast exam normal.  Screening mammogram June 2020 was negative.  Health labs with family physician.  Followed for Crohn's disease, chronic hypertension, diabetes mellitus type  2.  2. S/P total hysterectomy  3. Postmenopause Well on no hormone replacement therapy.  4. Screening for osteoporosis We will schedule a bone density here now.  Vitamin D supplements, calcium intake of 1200 mg daily and regular weightbearing physical activity is recommended. - DG Bone Density; Future  5. TOBACCO ABUSE Strongly recommend quitting tobacco smoking.  6. Class 3 severe obesity due to excess calories with serious comorbidity and body mass index (BMI) of 45.0  to 49.9 in adult [facility]) Recommend a lower calorie/carb diet such as [employer].  Aerobic physical activities 5 times a week and weight lifting every 2 days.  Princess Bruins MD, 11:01 AM 12/16/2018

## 2018-12-17 LAB — PAP IG W/ RFLX HPV ASCU

## 2018-12-18 ENCOUNTER — Other Ambulatory Visit (INDEPENDENT_AMBULATORY_CARE_PROVIDER_SITE_OTHER): Payer: Medicare Other

## 2018-12-18 DIAGNOSIS — B078 Other viral warts: Secondary | ICD-10-CM | POA: Diagnosis not present

## 2018-12-18 DIAGNOSIS — D2271 Melanocytic nevi of right lower limb, including hip: Secondary | ICD-10-CM | POA: Diagnosis not present

## 2018-12-18 DIAGNOSIS — Z1283 Encounter for screening for malignant neoplasm of skin: Secondary | ICD-10-CM | POA: Diagnosis not present

## 2018-12-18 DIAGNOSIS — K50019 Crohn's disease of small intestine with unspecified complications: Secondary | ICD-10-CM | POA: Diagnosis not present

## 2018-12-18 DIAGNOSIS — D485 Neoplasm of uncertain behavior of skin: Secondary | ICD-10-CM | POA: Diagnosis not present

## 2018-12-18 DIAGNOSIS — D225 Melanocytic nevi of trunk: Secondary | ICD-10-CM | POA: Diagnosis not present

## 2018-12-18 LAB — CBC WITH DIFFERENTIAL/PLATELET
Basophils Absolute: 0 10*3/uL (ref 0.0–0.1)
Basophils Relative: 0.3 % (ref 0.0–3.0)
Eosinophils Absolute: 0.2 10*3/uL (ref 0.0–0.7)
Eosinophils Relative: 1.4 % (ref 0.0–5.0)
HCT: 47.2 % — ABNORMAL HIGH (ref 36.0–46.0)
Hemoglobin: 15.5 g/dL — ABNORMAL HIGH (ref 12.0–15.0)
Lymphocytes Relative: 30.6 % (ref 12.0–46.0)
Lymphs Abs: 4.2 10*3/uL — ABNORMAL HIGH (ref 0.7–4.0)
MCHC: 32.8 g/dL (ref 30.0–36.0)
MCV: 97.9 fl (ref 78.0–100.0)
Monocytes Absolute: 1.2 10*3/uL — ABNORMAL HIGH (ref 0.1–1.0)
Monocytes Relative: 8.6 % (ref 3.0–12.0)
Neutro Abs: 8.2 10*3/uL — ABNORMAL HIGH (ref 1.4–7.7)
Neutrophils Relative %: 59.1 % (ref 43.0–77.0)
Platelets: 292 10*3/uL (ref 150.0–400.0)
RBC: 4.82 Mil/uL (ref 3.87–5.11)
RDW: 14.7 % (ref 11.5–15.5)
WBC: 13.8 10*3/uL — ABNORMAL HIGH (ref 4.0–10.5)

## 2018-12-22 ENCOUNTER — Encounter: Payer: Self-pay | Admitting: Podiatry

## 2018-12-22 ENCOUNTER — Other Ambulatory Visit: Payer: Self-pay

## 2018-12-22 ENCOUNTER — Ambulatory Visit (INDEPENDENT_AMBULATORY_CARE_PROVIDER_SITE_OTHER): Payer: Medicare Other | Admitting: Podiatry

## 2018-12-22 VITALS — Temp 97.9°F

## 2018-12-22 DIAGNOSIS — I739 Peripheral vascular disease, unspecified: Secondary | ICD-10-CM | POA: Diagnosis not present

## 2018-12-22 DIAGNOSIS — Q828 Other specified congenital malformations of skin: Secondary | ICD-10-CM

## 2018-12-22 DIAGNOSIS — B351 Tinea unguium: Secondary | ICD-10-CM

## 2018-12-24 NOTE — Progress Notes (Signed)
Subjective:   Patient ID: April May, female   DOB: 63 y.o.   MRN: [id number]   HPI 63 year old female presents the office today for concerns of toenail fungus and nails becoming thick and discolored.  She states that her primary care physician has ordered a cream to apply to the nails which she thinks is doing better but she is under treatment treatment she stopped it as she "gave up". She states that she wants "everything done today".  When asking her what this meant she states that she has a callus on her foot and she wants everything looked out.  She denies any open sores.  Her last A1c was 5.7.  Denies history of ulceration.  Some occasional cramping to her legs mostly at night but not with walking.   Review of Systems  All other systems reviewed and are negative.  Past Medical History:  Diagnosis Date  . Allergy   . Benign breast lumps   . COPD (chronic obstructive pulmonary disease) ([place])   . Crohn disease ([place])   . Depression   . Diabetes mellitus type II    borderline, md checks hemaglobin A 1 c at md office   . Hypertension   . Sleep apnea    does not use cpap since weight loss 1 and 1/2 years ago  . Small bowel obstruction ([place]) 2016  . 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 Vitamin D deficiency     Past Surgical History:  Procedure Laterality Date  . ABDOMINAL HYSTERECTOMY     1 ovary removed  . APPENDECTOMY    . APPLICATION OF WOUND VAC  11/24/2014   Procedure: APPLICATION OF WOUND VAC;  Surgeon: Donnie Mesa, MD;  Location: [facility];  Service: General;;  . BOWEL RESECTION N/A 11/24/2014   Procedure: SMALL BOWEL RESECTION;  Surgeon: Donnie Mesa, MD;  Location: [facility];  Service: General;  Laterality: N/A;  . BREAST EXCISIONAL BIOPSY Right 1972  . BREAST SURGERY Right 40 yrs ago   benign tumor removed  . LAPAROTOMY N/A 11/24/2014   Procedure: EXPLORATORY LAPAROTOMY EXTENSIVE LYSIS OF ADHESIONS SAMLL BOWEL RESECTION RIGHT HEMI COLECTOMY WOUND  VAC APPLICATION. ;  Surgeon: Donnie Mesa, MD;  Location: [facility];  Service: General;  Laterality: N/A;  . LYSIS OF ADHESION  11/24/2014   Procedure: LYSIS OF ADHESION (3 HRS);  Surgeon: Donnie Mesa, MD;  Location: [facility];  Service: General;;  . PARTIAL COLECTOMY  11/24/2014   Procedure: RIGHT HEMI COLECTOMY;  Surgeon: Donnie Mesa, MD;  Location: [facility];  Service: General;;  . SMALL INTESTINE SURGERY  age 57     Current Outpatient Medications:  .  albuterol (PROVENTIL HFA;VENTOLIN HFA) 108 (90 Base) MCG/ACT inhaler, Inhale 2 puffs into the lungs every 6 (six) hours as needed for wheezing or shortness of breath., Disp: 1 Inhaler, Rfl: 5 .  amLODipine (NORVASC) 10 MG tablet, , Disp: , Rfl:  .  budesonide (ENTOCORT EC) 3 MG 24 hr capsule, TAKE 3 CAPSULES BY MOUTH  DAILY, Disp: 270 capsule, Rfl: 1 .  buPROPion (WELLBUTRIN XL) 150 MG 24 hr tablet, TAKE 1 TABLET BY MOUTH ONCE A DAY EARLY IN THE MORNING, Disp: 90 tablet, Rfl: 1 .  BYSTOLIC 10 MG tablet, TAKE 1 TABLET BY MOUTH  DAILY, Disp: 90 tablet, Rfl: 0 .  Cholecalciferol (VITAMIN D3) 1.25 MG (50000 UT) CAPS, TAKE 1 CAPSULE BY MOUTH ONCE A WEEK, Disp: 12 capsule, Rfl: 1 .  clonazePAM (KLONOPIN) 0.5 MG tablet,  Take 0.5 mg by mouth 2 (two) times daily as needed for anxiety., Disp: , Rfl:  .  [employer] PAIN RELIEF EXTRA STRENGTH 500 MG tablet, TAKE 1 TABLET BY MOUTH EVERY 8 HOURS AS NEEDED FOR MODERATE PAIN, Disp: 60 tablet, Rfl: 5 .  fluticasone (FLONASE) 50 MCG/ACT nasal spray, USE 2 SPRAYS INTO BOTH  NOSTRILS DAILY AS NEEDED  FOR ALLERGIES OR RHINITIS., Disp: 32 g, Rfl: 3 .  furosemide (LASIX) 40 MG tablet, Take 40 mg by mouth every other day. , Disp: , Rfl:  .  Indacaterol-Glycopyrrolate (UTIBRON NEOHALER) 27.5-15.6 MCG CAPS, Place 1 Act into inhaler and inhale 2 (two) times daily. (Patient taking differently: Place 1 Act into inhaler and inhale as needed. ), Disp: 60 capsule, Rfl: 11 .  lamoTRIgine (LAMICTAL) 200 MG tablet, TAKE 1 TABLET BY MOUTH AT   BEDTIME, Disp: 90 tablet, Rfl: 1 .  oxyCODONE-acetaminophen (PERCOCET/ROXICET) 5-325 MG tablet, Take 1 tablet by mouth every 8 (eight) hours as needed for severe pain. (Patient taking differently: Take 1 tablet by mouth as needed for severe pain. ), Disp: 40 tablet, Rfl: 0 .  Potassium Chloride ER 20 MEQ TBCR, , Disp: , Rfl:  .  Potassium Citrate 15 MEQ (1620 MG) TBCR, Take 1 tablet by mouth daily. , Disp: , Rfl:  .  spironolactone (ALDACTONE) 50 MG tablet, TAKE 1 TABLET BY MOUTH  DAILY, Disp: 90 tablet, Rfl: 0 .  telmisartan (MICARDIS) 40 MG tablet, TAKE 1 TABLET BY MOUTH  DAILY, Disp: 90 tablet, Rfl: 1 .  vitamin B-12 (CYANOCOBALAMIN) 500 MCG tablet, Take 500 mcg by mouth daily., Disp: , Rfl:   Allergies  Allergen Reactions  . Azathioprine Other (See Comments)    Side effects to pancrease     . Crestor [Rosuvastatin Calcium] Other (See Comments)    Muscle and joint aches  . Lipitor [Atorvastatin Calcium] Other (See Comments)    Muscle cramps  . Lisinopril Cough          Objective:  Physical Exam  General: AAO x3, NAD  Dermatological: Nails to be trimmed, dystrophic with yellow-brown discoloration.  They do appear to have some clear on the proximal aspect.  There is no pain the nails there is no redness or drainage or wound infection.  Hyperkeratotic lesion plantar aspect left foot without any underlying ulceration drainage or signs of infection or foreign body.  Vascular: Dorsalis Pedis artery and Posterior Tibial artery pedal pulses are palpable bilateral with immedate capillary fill time.  There is no pain with calf compression, swelling, warmth, erythema.   Neruologic: Grossly intact via light touch bilateral. Vibratory intact via tuning fork bilateral. Protective threshold with Semmes Wienstein monofilament intact to all pedal sites bilateral.   Musculoskeletal: No gross boney pedal deformities bilateral. No pain, crepitus, or limitation noted with foot and ankle range of  motion bilateral. Muscular strength 5/5 in all groups tested bilateral.  Gait: Unassisted, Nonantalgic.       Assessment:   Onychomycosis, hyperkeratotic lesions     Plan:  -Treatment options discussed including all alternatives, risks, and complications -Etiology of symptoms were discussed -Discussed the present time in order to help with a nail fungus.  We discussed other topical options for her.  I do note a compound cream that she is using will be most helpful as she is already seen some improvement we discussed that we need to use this every 6 to 12 months.  She states that she wanted more than the immediate result however  with nail fungus unfortunately not possible.  She is to continue with the current treatment for now but if she changes her mind we can do different options for topical. -Hyperkeratotic lesion sharply debrided without any complications or bleeding. -Recommend moisturizer to her feet daily.  There is dry skin present.  Do not apply interdigitally. -Ordered ABI in the office.  Is 1.18 on the right and 1.16 on the left.  She has no claudication symptoms. -Discussed the importance of daily foot inspection.    Trula Slade DPM

## 2018-12-25 ENCOUNTER — Ambulatory Visit: Payer: Medicare Other | Admitting: Internal Medicine

## 2018-12-29 ENCOUNTER — Other Ambulatory Visit: Payer: Self-pay

## 2018-12-30 ENCOUNTER — Other Ambulatory Visit: Payer: Self-pay | Admitting: Obstetrics & Gynecology

## 2018-12-30 ENCOUNTER — Ambulatory Visit (INDEPENDENT_AMBULATORY_CARE_PROVIDER_SITE_OTHER): Payer: Medicare Other

## 2018-12-30 DIAGNOSIS — Z780 Asymptomatic menopausal state: Secondary | ICD-10-CM

## 2018-12-30 DIAGNOSIS — Z13820 Encounter for screening for osteoporosis: Secondary | ICD-10-CM

## 2019-01-08 ENCOUNTER — Ambulatory Visit (INDEPENDENT_AMBULATORY_CARE_PROVIDER_SITE_OTHER): Payer: Medicare Other

## 2019-01-08 DIAGNOSIS — E538 Deficiency of other specified B group vitamins: Secondary | ICD-10-CM

## 2019-01-08 DIAGNOSIS — Z23 Encounter for immunization: Secondary | ICD-10-CM

## 2019-01-08 MED ORDER — CYANOCOBALAMIN 1000 MCG/ML IJ SOLN
1000.0000 ug | Freq: Once | INTRAMUSCULAR | Status: AC
  Administered 2019-01-08: 1000 ug via INTRAMUSCULAR

## 2019-01-08 NOTE — Addendum Note (Signed)
Addended by: Ander Slade on: 01/08/2019 02:02 PM   Modules accepted: Orders

## 2019-01-08 NOTE — Progress Notes (Signed)
I have reviewed and agree.

## 2019-01-09 ENCOUNTER — Other Ambulatory Visit: Payer: Self-pay

## 2019-01-09 ENCOUNTER — Encounter (INDEPENDENT_AMBULATORY_CARE_PROVIDER_SITE_OTHER): Payer: Medicare Other | Admitting: Ophthalmology

## 2019-01-09 DIAGNOSIS — H35033 Hypertensive retinopathy, bilateral: Secondary | ICD-10-CM

## 2019-01-09 DIAGNOSIS — H43813 Vitreous degeneration, bilateral: Secondary | ICD-10-CM | POA: Diagnosis not present

## 2019-01-09 DIAGNOSIS — I10 Essential (primary) hypertension: Secondary | ICD-10-CM

## 2019-01-09 DIAGNOSIS — H2513 Age-related nuclear cataract, bilateral: Secondary | ICD-10-CM

## 2019-01-09 DIAGNOSIS — E11319 Type 2 diabetes mellitus with unspecified diabetic retinopathy without macular edema: Secondary | ICD-10-CM

## 2019-01-09 DIAGNOSIS — E113293 Type 2 diabetes mellitus with mild nonproliferative diabetic retinopathy without macular edema, bilateral: Secondary | ICD-10-CM

## 2019-01-15 ENCOUNTER — Encounter (INDEPENDENT_AMBULATORY_CARE_PROVIDER_SITE_OTHER): Payer: Medicare Other | Admitting: Ophthalmology

## 2019-01-20 ENCOUNTER — Telehealth: Payer: Self-pay

## 2019-01-20 NOTE — Telephone Encounter (Signed)
I forgot Dr. Marguerita Merles off this week. I reviewed report and advised patient it was marked "normal-no increased risk-continue present regimen and repeat 5 years".

## 2019-01-20 NOTE — Telephone Encounter (Signed)
Patient saw that Bone Density scanned into My Chart. She would like to know about results.

## 2019-01-22 ENCOUNTER — Other Ambulatory Visit: Payer: Self-pay | Admitting: Gastroenterology

## 2019-01-27 ENCOUNTER — Ambulatory Visit (INDEPENDENT_AMBULATORY_CARE_PROVIDER_SITE_OTHER): Payer: Medicare Other | Admitting: Internal Medicine

## 2019-01-27 ENCOUNTER — Encounter: Payer: Self-pay | Admitting: Internal Medicine

## 2019-01-27 ENCOUNTER — Other Ambulatory Visit (INDEPENDENT_AMBULATORY_CARE_PROVIDER_SITE_OTHER): Payer: Medicare Other

## 2019-01-27 ENCOUNTER — Other Ambulatory Visit: Payer: Self-pay

## 2019-01-27 VITALS — BP 134/80 | HR 61 | Temp 98.2°F | Ht 60.0 in | Wt 238.0 lb

## 2019-01-27 DIAGNOSIS — E559 Vitamin D deficiency, unspecified: Secondary | ICD-10-CM

## 2019-01-27 DIAGNOSIS — E876 Hypokalemia: Secondary | ICD-10-CM

## 2019-01-27 DIAGNOSIS — E538 Deficiency of other specified B group vitamins: Secondary | ICD-10-CM | POA: Diagnosis not present

## 2019-01-27 DIAGNOSIS — T502X5A Adverse effect of carbonic-anhydrase inhibitors, benzothiadiazides and other diuretics, initial encounter: Secondary | ICD-10-CM

## 2019-01-27 DIAGNOSIS — R7303 Prediabetes: Secondary | ICD-10-CM

## 2019-01-27 DIAGNOSIS — I10 Essential (primary) hypertension: Secondary | ICD-10-CM

## 2019-01-27 LAB — VITAMIN D 25 HYDROXY (VIT D DEFICIENCY, FRACTURES): VITD: 57.96 ng/mL (ref 30.00–100.00)

## 2019-01-27 LAB — BASIC METABOLIC PANEL
BUN: 11 mg/dL (ref 6–23)
CO2: 30 mEq/L (ref 19–32)
Calcium: 9.6 mg/dL (ref 8.4–10.5)
Chloride: 103 mEq/L (ref 96–112)
Creatinine, Ser: 0.85 mg/dL (ref 0.40–1.20)
GFR: 81.67 mL/min (ref >60.00)
Glucose, Bld: 101 mg/dL — ABNORMAL HIGH (ref 70–99)
Potassium: 3.4 mEq/L — ABNORMAL LOW (ref 3.5–5.1)
Sodium: 143 mEq/L (ref 135–145)

## 2019-01-27 LAB — POCT GLYCOSYLATED HEMOGLOBIN (HGB A1C): Hemoglobin A1C: 5.9 % — AB (ref 4.0–5.6)

## 2019-01-27 NOTE — Progress Notes (Signed)
Subjective:  Patient ID: April May, female    DOB: [birth year]  Age: 63 y.o. MRN: [id number]  CC: Hypertension   HPI [facility] April May presents for f/up - She complains of weight gain.  She admits she has not been exercising.  Since I last saw her she has seen her nephrologist and is increased her dose of spironolactone.  She tells me her blood pressure has been well controlled.  Outpatient Medications Prior to Visit  Medication Sig Dispense Refill  . albuterol (PROVENTIL HFA;VENTOLIN HFA) 108 (90 Base) MCG/ACT inhaler Inhale 2 puffs into the lungs every 6 (six) hours as needed for wheezing or shortness of breath. 1 Inhaler 5  . amLODipine (NORVASC) 10 MG tablet     . budesonide (ENTOCORT EC) 3 MG 24 hr capsule TAKE 3 CAPSULES BY MOUTH  DAILY 270 capsule 0  . buPROPion (WELLBUTRIN XL) 150 MG 24 hr tablet TAKE 1 TABLET BY MOUTH ONCE A DAY EARLY IN THE MORNING 90 tablet 1  . BYSTOLIC 10 MG tablet TAKE 1 TABLET BY MOUTH  DAILY 90 tablet 0  . Cholecalciferol (VITAMIN D3) 1.25 MG (50000 UT) CAPS TAKE 1 CAPSULE BY MOUTH ONCE A WEEK 12 capsule 1  . clonazePAM (KLONOPIN) 0.5 MG tablet Take 0.5 mg by mouth 2 (two) times daily as needed for anxiety.    . [employer] PAIN RELIEF EXTRA STRENGTH 500 MG tablet TAKE 1 TABLET BY MOUTH EVERY 8 HOURS AS NEEDED FOR MODERATE PAIN 60 tablet 5  . fluticasone (FLONASE) 50 MCG/ACT nasal spray USE 2 SPRAYS INTO BOTH  NOSTRILS DAILY AS NEEDED  FOR ALLERGIES OR RHINITIS. 32 g 3  . furosemide (LASIX) 40 MG tablet Take 40 mg by mouth every other day.     . Indacaterol-Glycopyrrolate (UTIBRON NEOHALER) 27.5-15.6 MCG CAPS Place 1 Act into inhaler and inhale 2 (two) times daily. (Patient taking differently: Place 1 Act into inhaler and inhale as needed. ) 60 capsule 11  . lamoTRIgine (LAMICTAL) 200 MG tablet TAKE 1 TABLET BY MOUTH AT  BEDTIME 90 tablet 1  . Potassium Citrate 15 MEQ (1620 MG) TBCR Take 1 tablet by mouth daily.     Marland Kitchen spironolactone (ALDACTONE)  100 MG tablet     . telmisartan (MICARDIS) 40 MG tablet TAKE 1 TABLET BY MOUTH  DAILY 90 tablet 1  . oxyCODONE-acetaminophen (PERCOCET/ROXICET) 5-325 MG tablet Take 1 tablet by mouth every 8 (eight) hours as needed for severe pain. (Patient taking differently: Take 1 tablet by mouth as needed for severe pain. ) 40 tablet 0  . vitamin B-12 (CYANOCOBALAMIN) 500 MCG tablet Take 500 mcg by mouth daily.    . Potassium Chloride ER 20 MEQ TBCR     . spironolactone (ALDACTONE) 50 MG tablet TAKE 1 TABLET BY MOUTH  DAILY 90 tablet 0   No facility-administered medications prior to visit.     ROS Review of Systems  Constitutional: Negative for diaphoresis, fatigue and unexpected weight change.  HENT: Negative.   Eyes: Negative for visual disturbance.  Respiratory: Negative for cough, chest tightness, shortness of breath and wheezing.   Cardiovascular: Negative for chest pain, palpitations and leg swelling.  Gastrointestinal: Negative for abdominal pain, constipation, diarrhea, nausea and vomiting.  Endocrine: Negative.   Genitourinary: Negative.  Negative for difficulty urinating.  Musculoskeletal: Negative.  Negative for myalgias.  Skin: Negative.   Neurological: Negative for dizziness and weakness.  Hematological: Negative for adenopathy. Does not bruise/bleed easily.  Psychiatric/Behavioral: Negative.     Objective:  BP 134/80 (BP Location: Left Arm, Patient Position: Sitting, Cuff Size: Large)   Pulse 61   Temp 98.2 F (36.8 C) (Oral)   Ht 5' (1.524 m)   Wt 238 lb (108 kg)   LMP 05/14/1988   SpO2 99%   BMI 46.48 kg/m   BP Readings from Last 3 Encounters:  01/27/19 134/80  12/16/18 130/80  10/10/18 122/80    Wt Readings from Last 3 Encounters:  01/27/19 238 lb (108 kg)  12/16/18 236 lb (107 kg)  10/10/18 238 lb 8 oz (108.2 kg)    Physical Exam Vitals signs reviewed.  Constitutional:      Appearance: She is obese. She is not ill-appearing or diaphoretic.  HENT:     Nose:  Nose normal.     Mouth/Throat:     Mouth: Mucous membranes are moist.  Eyes:     General: No scleral icterus.    Conjunctiva/sclera: Conjunctivae normal.  Neck:     Musculoskeletal: Normal range of motion and neck supple.  Cardiovascular:     Rate and Rhythm: Normal rate and regular rhythm.  Pulmonary:     Effort: Pulmonary effort is normal.     Breath sounds: No stridor. No wheezing, rhonchi or rales.  Abdominal:     General: Abdomen is protuberant. Bowel sounds are normal. There is no distension.     Palpations: Abdomen is soft. There is no hepatomegaly or splenomegaly.  Musculoskeletal: Normal range of motion.     Right lower leg: No edema.     Left lower leg: No edema.  Skin:    General: Skin is warm and dry.     Coloration: Skin is not pale.  Neurological:     General: No focal deficit present.     Mental Status: She is alert.  Psychiatric:        Mood and Affect: Mood normal.        Behavior: Behavior normal.     Lab Results  Component Value Date   WBC 13.8 (H) 12/18/2018   HGB 15.5 (H) 12/18/2018   HCT 47.2 (H) 12/18/2018   PLT 292.0 12/18/2018   GLUCOSE 101 (H) 01/27/2019   CHOL 114 04/30/2018   TRIG 111.0 04/30/2018   HDL 48.10 04/30/2018   LDLCALC 44 04/30/2018   ALT 25 10/10/2018   AST 17 10/10/2018   NA 143 01/27/2019   K 3.4 (L) 01/27/2019   CL 103 01/27/2019   CREATININE 0.85 01/27/2019   BUN 11 01/27/2019   CO2 30 01/27/2019   TSH 3.68 10/10/2018   HGBA1C 5.9 (A) 01/27/2019   MICROALBUR 10 01/31/2017    Mm 3d Screen Breast Bilateral  Result Date: 10/23/2018 CLINICAL DATA:  Screening. EXAM: DIGITAL SCREENING BILATERAL MAMMOGRAM WITH TOMO AND CAD COMPARISON:  Previous exam(s). ACR Breast Density Category b: There are scattered areas of fibroglandular density. FINDINGS: There are no findings suspicious for malignancy. Images were processed with CAD. IMPRESSION: No mammographic evidence of malignancy. A result letter of this screening mammogram will  be mailed directly to the patient. RECOMMENDATION: Screening mammogram in one year. (Code:SM-B-01Y) BI-RADS CATEGORY  1: Negative. Electronically Signed   By: Abelardo Diesel M.D.   On: 10/23/2018 10:39    Assessment & Plan:   April May was seen today for hypertension.  Diagnoses and all orders for this visit:  Malignant hypertension- Her blood pressure is adequately well controlled.  Her renal function is normal. -     Basic metabolic panel; Future  Diuretic-induced hypokalemia-  Her potassium level is slightly low at 3.4.  I have asked her to stay on the current dose of spironolactone for now. -     Basic metabolic panel; Future  Vitamin B12 deficiency  Prediabetes- She is prediabetic.  Medical therapy is not indicated. -     POCT glycosylated hemoglobin (Hb A1C)  Vitamin D deficiency- Her vitamin D level is in the normal range. -     VITAMIN D 25 Hydroxy (Vit-D Deficiency, Fractures); Future   I have discontinued Kristol W. Koker's vitamin B-12, oxyCODONE-acetaminophen, and Potassium Chloride ER. I am also having her maintain her fluticasone, albuterol, Indacaterol-Glycopyrrolate, furosemide, buPROPion, lamoTRIgine, CVS Pain Relief Extra Strength, Vitamin D3, telmisartan, Potassium Citrate, clonazePAM, Bystolic, amLODipine, budesonide, and spironolactone.  No orders of the defined types were placed in this encounter.    Follow-up: Return in about 6 months (around 07/27/2019).  April Calico, MD

## 2019-01-27 NOTE — Patient Instructions (Signed)

## 2019-02-10 ENCOUNTER — Encounter: Payer: Self-pay | Admitting: Gynecology

## 2019-02-12 ENCOUNTER — Encounter: Payer: Self-pay | Admitting: Internal Medicine

## 2019-02-13 ENCOUNTER — Other Ambulatory Visit: Payer: Self-pay | Admitting: Internal Medicine

## 2019-02-13 DIAGNOSIS — F411 Generalized anxiety disorder: Secondary | ICD-10-CM

## 2019-02-13 MED ORDER — CLONAZEPAM 0.5 MG PO TABS: 0.5000 mg | ORAL_TABLET | Freq: Two times a day (BID) | ORAL | 2 refills | Status: DC | PRN

## 2019-02-18 ENCOUNTER — Encounter: Payer: Self-pay | Admitting: Internal Medicine

## 2019-03-11 ENCOUNTER — Encounter: Payer: Self-pay | Admitting: Internal Medicine

## 2019-03-12 ENCOUNTER — Other Ambulatory Visit: Payer: Self-pay | Admitting: Internal Medicine

## 2019-03-12 DIAGNOSIS — F321 Major depressive disorder, single episode, moderate: Secondary | ICD-10-CM

## 2019-03-12 DIAGNOSIS — I10 Essential (primary) hypertension: Secondary | ICD-10-CM

## 2019-03-12 DIAGNOSIS — E559 Vitamin D deficiency, unspecified: Secondary | ICD-10-CM

## 2019-03-13 ENCOUNTER — Other Ambulatory Visit: Payer: Self-pay

## 2019-03-13 ENCOUNTER — Ambulatory Visit (INDEPENDENT_AMBULATORY_CARE_PROVIDER_SITE_OTHER): Payer: Medicare Other

## 2019-03-13 DIAGNOSIS — E538 Deficiency of other specified B group vitamins: Secondary | ICD-10-CM | POA: Diagnosis not present

## 2019-03-13 MED ORDER — CYANOCOBALAMIN 1000 MCG/ML IJ SOLN
1000.0000 ug | Freq: Once | INTRAMUSCULAR | Status: AC
  Administered 2019-03-13: 1000 ug via INTRAMUSCULAR

## 2019-03-13 NOTE — Progress Notes (Signed)
b12 Injection given.   April May J Karin Griffith, MD  

## 2019-03-15 ENCOUNTER — Other Ambulatory Visit: Payer: Self-pay | Admitting: Internal Medicine

## 2019-03-15 DIAGNOSIS — J449 Chronic obstructive pulmonary disease, unspecified: Secondary | ICD-10-CM

## 2019-03-16 ENCOUNTER — Ambulatory Visit: Payer: Medicare Other | Admitting: Internal Medicine

## 2019-03-19 DIAGNOSIS — L814 Other melanin hyperpigmentation: Secondary | ICD-10-CM | POA: Diagnosis not present

## 2019-03-19 DIAGNOSIS — L818 Other specified disorders of pigmentation: Secondary | ICD-10-CM | POA: Diagnosis not present

## 2019-03-19 DIAGNOSIS — D225 Melanocytic nevi of trunk: Secondary | ICD-10-CM | POA: Diagnosis not present

## 2019-03-19 DIAGNOSIS — Z1283 Encounter for screening for malignant neoplasm of skin: Secondary | ICD-10-CM | POA: Diagnosis not present

## 2019-03-23 ENCOUNTER — Ambulatory Visit: Payer: Medicare Other | Admitting: Podiatry

## 2019-04-07 DIAGNOSIS — K5090 Crohn's disease, unspecified, without complications: Secondary | ICD-10-CM | POA: Diagnosis not present

## 2019-04-07 DIAGNOSIS — E876 Hypokalemia: Secondary | ICD-10-CM | POA: Diagnosis not present

## 2019-04-07 DIAGNOSIS — I10 Essential (primary) hypertension: Secondary | ICD-10-CM | POA: Diagnosis not present

## 2019-04-07 DIAGNOSIS — Z720 Tobacco use: Secondary | ICD-10-CM | POA: Diagnosis not present

## 2019-04-08 ENCOUNTER — Ambulatory Visit (INDEPENDENT_AMBULATORY_CARE_PROVIDER_SITE_OTHER): Payer: Medicare Other | Admitting: Internal Medicine

## 2019-04-08 ENCOUNTER — Other Ambulatory Visit (INDEPENDENT_AMBULATORY_CARE_PROVIDER_SITE_OTHER): Payer: Medicare Other

## 2019-04-08 ENCOUNTER — Encounter: Payer: Self-pay | Admitting: Internal Medicine

## 2019-04-08 ENCOUNTER — Other Ambulatory Visit: Payer: Self-pay

## 2019-04-08 VITALS — BP 144/90 | HR 56 | Temp 98.6°F | Resp 16 | Wt 242.4 lb

## 2019-04-08 DIAGNOSIS — F332 Major depressive disorder, recurrent severe without psychotic features: Secondary | ICD-10-CM | POA: Insufficient documentation

## 2019-04-08 DIAGNOSIS — F32A Depression, unspecified: Secondary | ICD-10-CM

## 2019-04-08 DIAGNOSIS — I10 Essential (primary) hypertension: Secondary | ICD-10-CM

## 2019-04-08 DIAGNOSIS — E538 Deficiency of other specified B group vitamins: Secondary | ICD-10-CM

## 2019-04-08 DIAGNOSIS — T502X5A Adverse effect of carbonic-anhydrase inhibitors, benzothiadiazides and other diuretics, initial encounter: Secondary | ICD-10-CM

## 2019-04-08 DIAGNOSIS — E559 Vitamin D deficiency, unspecified: Secondary | ICD-10-CM

## 2019-04-08 DIAGNOSIS — E876 Hypokalemia: Secondary | ICD-10-CM | POA: Diagnosis not present

## 2019-04-08 DIAGNOSIS — F068 Other specified mental disorders due to known physiological condition: Secondary | ICD-10-CM | POA: Diagnosis not present

## 2019-04-08 DIAGNOSIS — F09 Unspecified mental disorder due to known physiological condition: Secondary | ICD-10-CM

## 2019-04-08 DIAGNOSIS — E785 Hyperlipidemia, unspecified: Secondary | ICD-10-CM

## 2019-04-08 DIAGNOSIS — F329 Major depressive disorder, single episode, unspecified: Secondary | ICD-10-CM

## 2019-04-08 LAB — BASIC METABOLIC PANEL
BUN: 8 mg/dL (ref 6–23)
CO2: 33 mEq/L — ABNORMAL HIGH (ref 19–32)
Calcium: 9.6 mg/dL (ref 8.4–10.5)
Chloride: 101 mEq/L (ref 96–112)
Creatinine, Ser: 0.82 mg/dL (ref 0.40–1.20)
GFR: 85.07 mL/min (ref >60.00)
Glucose, Bld: 101 mg/dL — ABNORMAL HIGH (ref 70–99)
Potassium: 3.3 mEq/L — ABNORMAL LOW (ref 3.5–5.1)
Sodium: 142 mEq/L (ref 135–145)

## 2019-04-08 LAB — CBC WITH DIFFERENTIAL/PLATELET
Basophils Absolute: 0 10*3/uL (ref 0.0–0.1)
Basophils Relative: 0.4 % (ref 0.0–3.0)
Eosinophils Absolute: 0.1 10*3/uL (ref 0.0–0.7)
Eosinophils Relative: 1.2 % (ref 0.0–5.0)
HCT: 44.6 % (ref 36.0–46.0)
Hemoglobin: 14.9 g/dL (ref 12.0–15.0)
Lymphocytes Relative: 30.9 % (ref 12.0–46.0)
Lymphs Abs: 3.6 10*3/uL (ref 0.7–4.0)
MCHC: 33.4 g/dL (ref 30.0–36.0)
MCV: 96.7 fl (ref 78.0–100.0)
Monocytes Absolute: 0.9 10*3/uL (ref 0.1–1.0)
Monocytes Relative: 7.4 % (ref 3.0–12.0)
Neutro Abs: 7.1 10*3/uL (ref 1.4–7.7)
Neutrophils Relative %: 60.1 % (ref 43.0–77.0)
Platelets: 269 10*3/uL (ref 150.0–400.0)
RBC: 4.61 Mil/uL (ref 3.87–5.11)
RDW: 14.2 % (ref 11.5–15.5)
WBC: 11.8 10*3/uL — ABNORMAL HIGH (ref 4.0–10.5)

## 2019-04-08 LAB — LIPID PANEL
Cholesterol: 116 mg/dL (ref 0–200)
HDL: 44.7 mg/dL (ref >39.00)
LDL Cholesterol: 53 mg/dL (ref 0–99)
NonHDL: 70.91
Total CHOL/HDL Ratio: 3
Triglycerides: 88 mg/dL (ref 0.0–149.0)
VLDL: 17.6 mg/dL (ref 0.0–40.0)

## 2019-04-08 LAB — TSH: TSH: 1.16 u[IU]/mL (ref 0.35–4.50)

## 2019-04-08 LAB — FOLATE: Folate: >24.1 ng/mL (ref >5.9)

## 2019-04-08 MED ORDER — VORTIOXETINE HBR 5 MG PO TABS: 5.0000 mg | ORAL_TABLET | Freq: Every day | ORAL | 0 refills | Status: DC

## 2019-04-08 MED ORDER — VORTIOXETINE HBR 10 MG PO TABS: 10.0000 mg | ORAL_TABLET | Freq: Every day | ORAL | 0 refills | Status: DC

## 2019-04-08 NOTE — Patient Instructions (Signed)
Major Depressive Disorder, Adult Major depressive disorder (MDD) is a mental health condition. It may also be called clinical depression or unipolar depression. MDD usually causes feelings of sadness, hopelessness, or helplessness. MDD can also cause physical symptoms. It can interfere with work, school, relationships, and other everyday activities. MDD may be mild, moderate, or severe. It may occur once (single episode major depressive disorder) or it may occur multiple times (recurrent major depressive disorder). What are the causes? The exact cause of this condition is not known. MDD is most likely caused by a combination of things, which may include:  Genetic factors. These are traits that are passed along from parent to child.  Individual factors. Your personality, your behavior, and the way you handle your thoughts and feelings may contribute to MDD. This includes personality traits and behaviors learned from others.  Physical factors, such as: ? Differences in the part of your brain that controls emotion. This part of your brain may be different than it is in people who do not have MDD. ? Long-term (chronic) medical or psychiatric illnesses.  Social factors. Traumatic experiences or major life changes may play a role in the development of MDD. What increases the risk? This condition is more likely to develop in women. The following factors may also make you more likely to develop MDD:  A family history of depression.  Troubled family relationships.  Abnormally low levels of certain brain chemicals.  Traumatic events in childhood, especially abuse or the loss of a parent.  Being under a lot of stress, or long-term stress, especially from upsetting life experiences or losses.  A history of: ? Chronic physical illness. ? Other mental health disorders. ? Substance abuse.  Poor living conditions.  Experiencing social exclusion or discrimination on a regular basis. What are the  signs or symptoms? The main symptoms of MDD typically include:  Constant depressed or irritable mood.  Loss of interest in things and activities. MDD symptoms may also include:  Sleeping or eating too much or too little.  Unexplained weight change.  Fatigue or low energy.  Feelings of worthlessness or guilt.  Difficulty thinking clearly or making decisions.  Thoughts of suicide or of harming others.  Physical agitation or weakness.  Isolation. Severe cases of MDD may also occur with other symptoms, such as:  Delusions or hallucinations, in which you imagine things that are not real (psychotic depression).  Low-level depression that lasts at least a year (chronic depression or persistent depressive disorder).  Extreme sadness and hopelessness (melancholic depression).  Trouble speaking and moving (catatonic depression). How is this diagnosed? This condition may be diagnosed based on:  Your symptoms.  Your medical history, including your mental health history. This may involve tests to evaluate your mental health. You may be asked questions about your lifestyle, including any drug and alcohol use, and how long you have had symptoms of MDD.  A physical exam.  Blood tests to rule out other conditions. You must have a depressed mood and at least four other MDD symptoms most of the day, nearly every day in the same 2-week timeframe before your health care provider can confirm a diagnosis of MDD. How is this treated? This condition is usually treated by mental health professionals, such as psychologists, psychiatrists, and clinical social workers. You may need more than one type of treatment. Treatment may include:  Psychotherapy. This is also called talk therapy or counseling. Types of psychotherapy include: ? Cognitive behavioral therapy (CBT). This type of therapy  teaches you to recognize unhealthy feelings, thoughts, and behaviors, and replace them with positive thoughts  and actions. ? Interpersonal therapy (IPT). This helps you to improve the way you relate to and communicate with others. ? Family therapy. This treatment includes members of your family.  Medicine to treat anxiety and depression, or to help you control certain emotions and behaviors.  Lifestyle changes, such as: ? Limiting alcohol and drug use. ? Exercising regularly. ? Getting plenty of sleep. ? Making healthy eating choices. ? Spending more time outdoors.  Treatments involving stimulation of the brain can be used in situations with extremely severe symptoms, or when medicine or other therapies do not work over time. These treatments include electroconvulsive therapy, transcranial magnetic stimulation, and vagal nerve stimulation. Follow these instructions at home: Activity  Return to your normal activities as told by your health care provider.  Exercise regularly and spend time outdoors as told by your health care provider. General instructions  Take over-the-counter and prescription medicines only as told by your health care provider.  Do not drink alcohol. If you drink alcohol, limit your alcohol intake to no more than 1 drink a day for nonpregnant women and 2 drinks a day for men. One drink equals 12 oz of beer, 5 oz of wine, or 1 oz of hard liquor. Alcohol can affect any antidepressant medicines you are taking. Talk to your health care provider about your alcohol use.  Eat a healthy diet and get plenty of sleep.  Find activities that you enjoy doing, and make time to do them.  Consider joining a support group. Your health care provider may be able to recommend a support group.  Keep all follow-up visits as told by your health care provider. This is important. Where to find more information [employer] on Mental Illness  www.nami.org [place] National Institute of Mental Health  https://carter.com/ National Suicide Prevention Lifeline  1-800-273-TALK 609-678-6215). This is  free, 24-hour help. Contact a health care provider if:  Your symptoms get worse.  You develop new symptoms. Get help right away if:  You self-harm.  You have serious thoughts about hurting yourself or others.  You see, hear, taste, smell, or feel things that are not present (hallucinate). This information is not intended to replace advice given to you by your health care provider. Make sure you discuss any questions you have with your health care provider. Document Released: 08/25/2012 Document Revised: 04/12/2017 Document Reviewed: 11/09/2015 Elsevier Patient Education  2020 [employer].

## 2019-04-08 NOTE — Progress Notes (Signed)
Subjective:  Patient ID: April May, female    DOB: Sep 10, 1955  Age: 63 y.o. MRN: [id number]  CC: Hypertension, Hyperlipidemia, and Depression  This visit occurred during the SARS-CoV-2 public health emergency.  Safety protocols were in place, including screening questions prior to the visit, additional usage of staff PPE, and extensive cleaning of exam room while observing appropriate contact time as indicated for disinfecting solutions.    HPI April May presents for f/up - She complains of a 1 year history of forgetfulness, feeling indecisive, feeling sad and lonely, crying spells, and feeling unmotivated.  She tells me she is not getting much symptom relief with clonazepam and bupropion.  She feels like lamotrigine has stabilized her mood and she has not felt manicky lately.  Outpatient Medications Prior to Visit  Medication Sig Dispense Refill  . budesonide (ENTOCORT EC) 3 MG 24 hr capsule TAKE 3 CAPSULES BY MOUTH  DAILY 270 capsule 0  . BYSTOLIC 10 MG tablet TAKE 1 TABLET BY MOUTH  DAILY 90 tablet 1  . Cholecalciferol (VITAMIN D3) 1.25 MG (50000 UT) CAPS TAKE 1 CAPSULE BY MOUTH ONE TIME PER WEEK 12 capsule 0  . clonazePAM (KLONOPIN) 0.5 MG tablet Take 1 tablet (0.5 mg total) by mouth 2 (two) times daily as needed for anxiety. 60 tablet 2  . [employer] PAIN RELIEF EXTRA STRENGTH 500 MG tablet TAKE 1 TABLET BY MOUTH EVERY 8 HOURS AS NEEDED FOR MODERATE PAIN 60 tablet 5  . fluticasone (FLONASE) 50 MCG/ACT nasal spray USE 2 SPRAYS INTO BOTH  NOSTRILS DAILY AS NEEDED  FOR ALLERGIES OR RHINITIS. 32 g 3  . furosemide (LASIX) 40 MG tablet Take 40 mg by mouth every other day.     . Indacaterol-Glycopyrrolate (UTIBRON NEOHALER) 27.5-15.6 MCG CAPS Place 1 Act into inhaler and inhale 2 (two) times daily. (Patient taking differently: Place 1 Act into inhaler and inhale as needed. ) 60 capsule 11  . lamoTRIgine (LAMICTAL) 200 MG tablet TAKE 1 TABLET BY MOUTH AT  BEDTIME 90 tablet  1  . PROAIR HFA 108 (90 Base) MCG/ACT inhaler TAKE 2 PUFFS BY MOUTH EVERY 6 HOURS AS NEEDED FOR WHEEZE OR SHORTNESS OF BREATH 18 g 5  . spironolactone (ALDACTONE) 100 MG tablet     . telmisartan (MICARDIS) 40 MG tablet TAKE 1 TABLET BY MOUTH  DAILY 90 tablet 1  . buPROPion (WELLBUTRIN XL) 150 MG 24 hr tablet TAKE 1 TABLET BY MOUTH ONCE A DAY EARLY IN THE MORNING 90 tablet 1  . Potassium Citrate 15 MEQ (1620 MG) TBCR Take 1 tablet by mouth daily.     Marland Kitchen amLODipine (NORVASC) 10 MG tablet      No facility-administered medications prior to visit.     ROS Review of Systems  Constitutional: Positive for unexpected weight change (wt gain). Negative for diaphoresis and fatigue.  HENT: Negative.   Eyes: Negative.   Respiratory: Negative for cough, chest tightness, shortness of breath and wheezing.   Cardiovascular: Negative for chest pain, palpitations and leg swelling.  Gastrointestinal: Negative for abdominal pain, constipation, diarrhea, nausea and vomiting.  Endocrine: Negative.   Genitourinary: Negative.  Negative for difficulty urinating.  Musculoskeletal: Negative.  Negative for myalgias.  Skin: Negative.   Neurological: Negative.  Negative for dizziness, weakness, light-headedness, numbness and headaches.  Hematological: Negative for adenopathy. Does not bruise/bleed easily.  Psychiatric/Behavioral: Positive for decreased concentration and dysphoric mood. Negative for agitation, behavioral problems, confusion, hallucinations, self-injury and sleep disturbance. The patient is nervous/anxious. The  patient is not hyperactive.     Objective:  BP (!) 144/90   Pulse (!) 56   Temp 98.6 F (37 C) (Oral)   Resp 16   Wt 242 lb 6.4 oz (110 kg)   LMP 05/14/1988   SpO2 97%   BMI 47.34 kg/m   BP Readings from Last 3 Encounters:  04/08/19 (!) 144/90  01/27/19 134/80  12/16/18 130/80    Wt Readings from Last 3 Encounters:  04/08/19 242 lb 6.4 oz (110 kg)  01/27/19 238 lb (108 kg)   12/16/18 236 lb (107 kg)    Physical Exam Vitals signs reviewed.  Constitutional:      Appearance: She is obese.  HENT:     Nose: Nose normal.     Mouth/Throat:     Mouth: Mucous membranes are moist.  Eyes:     General: No scleral icterus.    Conjunctiva/sclera: Conjunctivae normal.  Neck:     Musculoskeletal: Neck supple.  Cardiovascular:     Rate and Rhythm: Normal rate and regular rhythm.     Heart sounds: No murmur.  Pulmonary:     Effort: Pulmonary effort is normal.     Breath sounds: No stridor. No wheezing, rhonchi or rales.  Abdominal:     General: Abdomen is protuberant. Bowel sounds are normal. There is no distension.     Palpations: There is no hepatomegaly, splenomegaly or mass.     Tenderness: There is no abdominal tenderness.  Musculoskeletal: Normal range of motion.     Right lower leg: No edema.     Left lower leg: No edema.  Lymphadenopathy:     Cervical: No cervical adenopathy.  Skin:    General: Skin is dry.  Neurological:     General: No focal deficit present.     Mental Status: She is alert. Mental status is at baseline.  Psychiatric:        Attention and Perception: Attention and perception normal.        Mood and Affect: Mood is anxious and depressed. Mood is not elated. Affect is flat and tearful. Affect is not labile, angry or inappropriate.        Speech: Speech normal.        Behavior: Behavior normal. Behavior is cooperative.        Thought Content: Thought content normal. Thought content is not paranoid or delusional. Thought content does not include homicidal or suicidal ideation.        Cognition and Memory: Cognition normal.        Judgment: Judgment normal.     Lab Results  Component Value Date   WBC 11.8 (H) 04/08/2019   HGB 14.9 04/08/2019   HCT 44.6 04/08/2019   PLT 269.0 04/08/2019   GLUCOSE 101 (H) 04/08/2019   CHOL 116 04/08/2019   TRIG 88.0 04/08/2019   HDL 44.70 04/08/2019   LDLCALC 53 04/08/2019   ALT 25 10/10/2018    AST 17 10/10/2018   NA 142 04/08/2019   K 3.3 (L) 04/08/2019   CL 101 04/08/2019   CREATININE 0.82 04/08/2019   BUN 8 04/08/2019   CO2 33 (H) 04/08/2019   TSH 1.16 04/08/2019   HGBA1C 5.9 (A) 01/27/2019   MICROALBUR 10 01/31/2017    Mm 3d Screen Breast Bilateral  Result Date: 10/23/2018 CLINICAL DATA:  Screening. EXAM: DIGITAL SCREENING BILATERAL MAMMOGRAM WITH TOMO AND CAD COMPARISON:  Previous exam(s). ACR Breast Density Category b: There are scattered areas of fibroglandular density. FINDINGS: There  are no findings suspicious for malignancy. Images were processed with CAD. IMPRESSION: No mammographic evidence of malignancy. A result letter of this screening mammogram will be mailed directly to the patient. RECOMMENDATION: Screening mammogram in one year. (Code:SM-B-01Y) BI-RADS CATEGORY  1: Negative. Electronically Signed   By: Abelardo Diesel M.D.   On: 10/23/2018 10:39    Assessment & Plan:   April May was seen today for hypertension, hyperlipidemia and depression.  Diagnoses and all orders for this visit:  Malignant hypertension- Her blood pressure is adequately well controlled.  She has a low potassium level so I recommended that she start taking a potassium supplement. -     Basic metabolic panel; Future -     CBC with Differential; Future -     TSH; Future -     Potassium Citrate 15 MEQ (1620 MG) TBCR; Take 1 tablet by mouth 2 (two) times daily.  Hyperlipidemia LDL goal <130- Statin therapy is not indicated. -     Lipid panel; Future -     TSH; Future  Diuretic-induced hypokalemia -     Basic metabolic panel; Future -     Potassium Citrate 15 MEQ (1620 MG) TBCR; Take 1 tablet by mouth 2 (two) times daily.  Cognitive dysfunction associated with depression- I think she would benefit from starting trintellix.  This is an antidepressant that can improve cognitive functioning.  I do not think she is benefiting much from the bupropion so I recommended that she taper off of it  over the next 3 weeks. -     vortioxetine HBr (TRINTELLIX) 5 MG TABS tablet; Take 1 tablet (5 mg total) by mouth daily. -     vortioxetine HBr (TRINTELLIX) 10 MG TABS tablet; Take 1 tablet (10 mg total) by mouth daily.  Severe episode of recurrent major depressive disorder, without psychotic features (HCC) -     vortioxetine HBr (TRINTELLIX) 5 MG TABS tablet; Take 1 tablet (5 mg total) by mouth daily. -     vortioxetine HBr (TRINTELLIX) 10 MG TABS tablet; Take 1 tablet (10 mg total) by mouth daily.  Vitamin D deficiency  Vitamin B12 deficiency -     Folate; Future   I have discontinued April May's amLODipine and buPROPion. I have also changed her Potassium Citrate. Additionally, I am having her start on vortioxetine HBr and vortioxetine HBr. Lastly, I am having her maintain her fluticasone, Indacaterol-Glycopyrrolate, furosemide, [employer]S Pain Relief Extra Strength, budesonide, spironolactone, clonazePAM, Vitamin D3, Bystolic, lamoTRIgine, telmisartan, and ProAir HFA.  Meds ordered this encounter  Medications  . vortioxetine HBr (TRINTELLIX) 5 MG TABS tablet    Sig: Take 1 tablet (5 mg total) by mouth daily.    Dispense:  7 tablet    Refill:  0  . vortioxetine HBr (TRINTELLIX) 10 MG TABS tablet    Sig: Take 1 tablet (10 mg total) by mouth daily.    Dispense:  35 tablet    Refill:  0  . Potassium Citrate 15 MEQ (1620 MG) TBCR    Sig: Take 1 tablet by mouth 2 (two) times daily.    Dispense:  180 tablet    Refill:  1     Follow-up: Return in about 3 months (around 07/09/2019).  Scarlette Calico, MD

## 2019-04-13 ENCOUNTER — Encounter: Payer: Self-pay | Admitting: Internal Medicine

## 2019-04-13 MED ORDER — POTASSIUM CITRATE ER 15 MEQ (1620 MG) PO TBCR: 1.0000 | EXTENDED_RELEASE_TABLET | Freq: Two times a day (BID) | ORAL | 1 refills | Status: DC

## 2019-04-20 ENCOUNTER — Encounter: Payer: Self-pay | Admitting: Gastroenterology

## 2019-04-20 NOTE — Telephone Encounter (Signed)
Error

## 2019-05-11 ENCOUNTER — Encounter: Payer: Self-pay | Admitting: Internal Medicine

## 2019-05-11 DIAGNOSIS — F332 Major depressive disorder, recurrent severe without psychotic features: Secondary | ICD-10-CM

## 2019-05-11 DIAGNOSIS — F068 Other specified mental disorders due to known physiological condition: Secondary | ICD-10-CM

## 2019-05-11 DIAGNOSIS — F09 Unspecified mental disorder due to known physiological condition: Secondary | ICD-10-CM

## 2019-05-11 DIAGNOSIS — I10 Essential (primary) hypertension: Secondary | ICD-10-CM

## 2019-05-11 DIAGNOSIS — F32A Depression, unspecified: Secondary | ICD-10-CM

## 2019-05-12 MED ORDER — VORTIOXETINE HBR 10 MG PO TABS: 10.0000 mg | ORAL_TABLET | Freq: Every day | ORAL | 0 refills | Status: DC

## 2019-05-13 ENCOUNTER — Ambulatory Visit: Payer: Medicare Other

## 2019-05-14 ENCOUNTER — Other Ambulatory Visit: Payer: Self-pay

## 2019-05-14 ENCOUNTER — Ambulatory Visit (INDEPENDENT_AMBULATORY_CARE_PROVIDER_SITE_OTHER): Payer: Medicare Other

## 2019-05-14 DIAGNOSIS — E538 Deficiency of other specified B group vitamins: Secondary | ICD-10-CM | POA: Diagnosis not present

## 2019-05-14 MED ORDER — CYANOCOBALAMIN 1000 MCG/ML IJ SOLN
1000.0000 ug | Freq: Once | INTRAMUSCULAR | Status: AC
  Administered 2019-05-14: 1000 ug via INTRAMUSCULAR

## 2019-05-14 NOTE — Progress Notes (Signed)
Medical screening examination/treatment/procedure(s) were performed by non-physician practitioner and as supervising physician I was immediately available for consultation/collaboration. I agree with above. James John, MD   

## 2019-05-19 ENCOUNTER — Other Ambulatory Visit: Payer: Self-pay

## 2019-05-19 ENCOUNTER — Encounter: Payer: Self-pay | Admitting: Internal Medicine

## 2019-05-19 ENCOUNTER — Ambulatory Visit (INDEPENDENT_AMBULATORY_CARE_PROVIDER_SITE_OTHER): Payer: Medicare Other | Admitting: Internal Medicine

## 2019-05-19 VITALS — BP 142/84 | HR 61 | Temp 98.5°F | Resp 16 | Ht 60.0 in | Wt 241.0 lb

## 2019-05-19 DIAGNOSIS — E876 Hypokalemia: Secondary | ICD-10-CM

## 2019-05-19 DIAGNOSIS — I10 Essential (primary) hypertension: Secondary | ICD-10-CM

## 2019-05-19 DIAGNOSIS — T502X5A Adverse effect of carbonic-anhydrase inhibitors, benzothiadiazides and other diuretics, initial encounter: Secondary | ICD-10-CM | POA: Diagnosis not present

## 2019-05-19 DIAGNOSIS — N61 Mastitis without abscess: Secondary | ICD-10-CM | POA: Diagnosis not present

## 2019-05-19 MED ORDER — SULFAMETHOXAZOLE-TRIMETHOPRIM 800-160 MG PO TABS: 1.0000 | ORAL_TABLET | Freq: Two times a day (BID) | ORAL | 0 refills | Status: AC

## 2019-05-19 NOTE — Progress Notes (Signed)
Subjective:  Patient ID: April May, female    DOB: Apr 13, 1956  Age: 64 y.o. MRN: [id number]  CC: Hypertension and Breast Problem   This visit occurred during the SARS-CoV-2 public health emergency.  Safety protocols were in place, including screening questions prior to the visit, additional usage of staff PPE, and extensive cleaning of exam room while observing appropriate contact time as indicated for disinfecting solutions.    HPI April May presents for f/up - She complains of a 1 week history of abnormality in the skin over her left lower lateral breast.  She said she has noticed a subcutaneous nodule there that drains yellow pus with crusting.  She says the area has been decreasing in size over the last few days.  Outpatient Medications Prior to Visit  Medication Sig Dispense Refill  . amLODipine (NORVASC) 5 MG tablet Take 10 mg by mouth daily.    . budesonide (ENTOCORT EC) 3 MG 24 hr capsule TAKE 3 CAPSULES BY MOUTH  DAILY 270 capsule 0  . BYSTOLIC 10 MG tablet TAKE 1 TABLET BY MOUTH  DAILY 90 tablet 1  . Cholecalciferol (VITAMIN D3) 1.25 MG (50000 UT) CAPS TAKE 1 CAPSULE BY MOUTH ONE TIME PER WEEK 12 capsule 0  . clonazePAM (KLONOPIN) 0.5 MG tablet Take 1 tablet (0.5 mg total) by mouth 2 (two) times daily as needed for anxiety. 60 tablet 2  . [employer] PAIN RELIEF EXTRA STRENGTH 500 MG tablet TAKE 1 TABLET BY MOUTH EVERY 8 HOURS AS NEEDED FOR MODERATE PAIN 60 tablet 5  . fluticasone (FLONASE) 50 MCG/ACT nasal spray USE 2 SPRAYS INTO BOTH  NOSTRILS DAILY AS NEEDED  FOR ALLERGIES OR RHINITIS. 32 g 3  . furosemide (LASIX) 40 MG tablet Take 40 mg by mouth every other day.     . Indacaterol-Glycopyrrolate (UTIBRON NEOHALER) 27.5-15.6 MCG CAPS Place 1 Act into inhaler and inhale 2 (two) times daily. (Patient taking differently: Place 1 Act into inhaler and inhale as needed. ) 60 capsule 11  . lamoTRIgine (LAMICTAL) 200 MG tablet TAKE 1 TABLET BY MOUTH AT  BEDTIME 90  tablet 1  . Potassium Citrate 15 MEQ (1620 MG) TBCR Take 1 tablet by mouth 2 (two) times daily. 180 tablet 1  . PROAIR HFA 108 (90 Base) MCG/ACT inhaler TAKE 2 PUFFS BY MOUTH EVERY 6 HOURS AS NEEDED FOR WHEEZE OR SHORTNESS OF BREATH 18 g 5  . spironolactone (ALDACTONE) 100 MG tablet 50 mg.     . vortioxetine HBr (TRINTELLIX) 10 MG TABS tablet Take 1 tablet (10 mg total) by mouth daily. 90 tablet 0  . telmisartan (MICARDIS) 40 MG tablet TAKE 1 TABLET BY MOUTH  DAILY 90 tablet 1   No facility-administered medications prior to visit.    ROS Review of Systems  Constitutional: Negative for chills, fatigue and fever.  HENT: Negative.   Eyes: Negative.   Respiratory: Negative for cough, chest tightness, shortness of breath and wheezing.   Cardiovascular: Negative for chest pain, palpitations and leg swelling.  Gastrointestinal: Negative for abdominal pain.  Endocrine: Negative.   Genitourinary: Negative.   Musculoskeletal: Negative.   Skin: Positive for color change.  Neurological: Negative.  Negative for dizziness, weakness and light-headedness.  Hematological: Negative.  Negative for adenopathy. Does not bruise/bleed easily.  Psychiatric/Behavioral: Negative.     Objective:  BP (!) 142/84 (BP Location: Left Arm, Patient Position: Sitting, Cuff Size: Large)   Pulse 61   Temp 98.5 F (36.9 C) (Oral)   Resp  16   Ht 5' (1.524 m)   Wt 241 lb (109.3 kg)   LMP 05/14/1988   SpO2 98%   BMI 47.07 kg/m   BP Readings from Last 3 Encounters:  05/19/19 (!) 142/84  04/08/19 (!) 144/90  01/27/19 134/80    Wt Readings from Last 3 Encounters:  05/19/19 241 lb (109.3 kg)  04/08/19 242 lb 6.4 oz (110 kg)  01/27/19 238 lb (108 kg)    Physical Exam Constitutional:      Appearance: Normal appearance. She is obese.  HENT:     Mouth/Throat:     Mouth: Mucous membranes are moist.  Eyes:     General: No scleral icterus.    Conjunctiva/sclera: Conjunctivae normal.  Cardiovascular:      Rate and Rhythm: Normal rate and regular rhythm.     Heart sounds: No murmur.  Pulmonary:     Effort: Pulmonary effort is normal.     Breath sounds: No stridor. No wheezing, rhonchi or rales.  Chest:     Chest wall: Deformity present. No mass.     Breasts:        Right: No swelling, inverted nipple or mass.        Left: Skin change present. No swelling, inverted nipple or mass.    Abdominal:     General: Abdomen is protuberant. There is no distension.     Palpations: There is no hepatomegaly or splenomegaly.  Musculoskeletal:        General: Normal range of motion.     Cervical back: Neck supple.  Lymphadenopathy:     Cervical: No cervical adenopathy.  Skin:    General: Skin is warm and dry.  Neurological:     General: No focal deficit present.     Mental Status: She is alert.     Lab Results  Component Value Date   WBC 11.8 (H) 04/08/2019   HGB 14.9 04/08/2019   HCT 44.6 04/08/2019   PLT 269.0 04/08/2019   GLUCOSE 101 (H) 04/08/2019   CHOL 116 04/08/2019   TRIG 88.0 04/08/2019   HDL 44.70 04/08/2019   LDLCALC 53 04/08/2019   ALT 25 10/10/2018   AST 17 10/10/2018   NA 142 04/08/2019   K 3.3 (L) 04/08/2019   CL 101 04/08/2019   CREATININE 0.82 04/08/2019   BUN 8 04/08/2019   CO2 33 (H) 04/08/2019   TSH 1.16 04/08/2019   HGBA1C 5.9 (A) 01/27/2019   MICROALBUR 10 01/31/2017    MM 3D SCREEN BREAST BILATERAL  Result Date: 10/23/2018 CLINICAL DATA:  Screening. EXAM: DIGITAL SCREENING BILATERAL MAMMOGRAM WITH TOMO AND CAD COMPARISON:  Previous exam(s). ACR Breast Density Category b: There are scattered areas of fibroglandular density. FINDINGS: There are no findings suspicious for malignancy. Images were processed with CAD. IMPRESSION: No mammographic evidence of malignancy. A result letter of this screening mammogram will be mailed directly to the patient. RECOMMENDATION: Screening mammogram in one year. (Code:SM-B-01Y) BI-RADS CATEGORY  1: Negative. Electronically  Signed   By: Abelardo Diesel M.D.   On: 10/23/2018 10:39    Assessment & Plan:   April May was seen today for hypertension and breast problem.  Diagnoses and all orders for this visit:  Malignant hypertension- Her blood pressure is adequately well controlled.  I will monitor her electrolytes and renal function. -     Basic metabolic panel  Diuretic-induced hypokalemia- See above. -     Basic metabolic panel  Cellulitis of left breast- This is likely a subcutaneous  staph furuncle.  This is not large enough or well formed enough to be incised and drained.  I have advised her to apply warm compresses and to start taking sulfamethoxazole/trimethoprim to treat this. -     sulfamethoxazole-trimethoprim (BACTRIM DS) 800-160 MG tablet; Take 1 tablet by mouth 2 (two) times daily for 7 days.   I have discontinued Kaylene W. Desha's telmisartan. I am also having her start on sulfamethoxazole-trimethoprim. Additionally, I am having her maintain her fluticasone, Indacaterol-Glycopyrrolate, furosemide, [employer] Pain Relief Extra Strength, budesonide, spironolactone, clonazePAM, Vitamin D3, Bystolic, lamoTRIgine, ProAir HFA, Potassium Citrate, vortioxetine HBr, and amLODipine.  Meds ordered this encounter  Medications  . sulfamethoxazole-trimethoprim (BACTRIM DS) 800-160 MG tablet    Sig: Take 1 tablet by mouth 2 (two) times daily for 7 days.    Dispense:  14 tablet    Refill:  0     Follow-up: Return in about 3 weeks (around 06/09/2019).  Scarlette Calico, MD

## 2019-05-19 NOTE — Patient Instructions (Signed)
Skin Abscess  A skin abscess is an infected area on or under your skin that contains a collection of pus and other material. An abscess may also be called a furuncle, carbuncle, or boil. An abscess can occur in or on almost any part of your body. Some abscesses break open (rupture) on their own. Most continue to get worse unless they are treated. The infection can spread deeper into the body and eventually into your blood, which can make you feel ill. Treatment usually involves draining the abscess. What are the causes? An abscess occurs when germs, like bacteria, pass through your skin and cause an infection. This may be caused by:  A scrape or cut on your skin.  A puncture wound through your skin, including a needle injection or insect bite.  Blocked oil or sweat glands.  Blocked and infected hair follicles.  A cyst that forms beneath your skin (sebaceous cyst) and becomes infected. What increases the risk? This condition is more likely to develop in people who:  Have a weak body defense system (immune system).  Have diabetes.  Have dry and irritated skin.  Get frequent injections or use illegal IV drugs.  Have a foreign body in a wound, such as a splinter.  Have problems with their lymph system or veins. What are the signs or symptoms? Symptoms of this condition include:  A painful, firm bump under the skin.  A bump with pus at the top. This may break through the skin and drain. Other symptoms include:  Redness surrounding the abscess site.  Warmth.  Swelling of the lymph nodes (glands) near the abscess.  Tenderness.  A sore on the skin. How is this diagnosed? This condition may be diagnosed based on:  A physical exam.  Your medical history.  A sample of pus. This may be used to find out what is causing the infection.  Blood tests.  Imaging tests, such as an ultrasound, CT scan, or MRI. How is this treated? A small abscess that drains on its own may  not need treatment. Treatment for larger abscesses may include:  Moist heat or heat pack applied to the area several times a day.  A procedure to drain the abscess (incision and drainage).  Antibiotic medicines. For a severe abscess, you may first get antibiotics through an IV and then change to antibiotics by mouth. Follow these instructions at home: Medicines   Take over-the-counter and prescription medicines only as told by your health care provider.  If you were prescribed an antibiotic medicine, take it as told by your health care provider. Do not stop taking the antibiotic even if you start to feel better. Abscess care   If you have an abscess that has not drained, apply heat to the affected area. Use the heat source that your health care provider recommends, such as a moist heat pack or a heating pad. ? Place a towel between your skin and the heat source. ? Leave the heat on for 20-30 minutes. ? Remove the heat if your skin turns bright red. This is especially important if you are unable to feel pain, heat, or cold. You may have a greater risk of getting burned.  Follow instructions from your health care provider about how to take care of your abscess. Make sure you: ? Cover the abscess with a bandage (dressing). ? Change your dressing or gauze as told by your health care provider. ? Wash your hands with soap and water before you change the   dressing or gauze. If soap and water are not available, use hand sanitizer.  Check your abscess every day for signs of a worsening infection. Check for: ? More redness, swelling, or pain. ? More fluid or blood. ? Warmth. ? More pus or a bad smell. General instructions  To avoid spreading the infection: ? Do not share personal care items, towels, or hot tubs with others. ? Avoid making skin contact with other people.  Keep all follow-up visits as told by your health care provider. This is important. Contact a health care provider if  you have:  More redness, swelling, or pain around your abscess.  More fluid or blood coming from your abscess.  Warm skin around your abscess.  More pus or a bad smell coming from your abscess.  A fever.  Muscle aches.  Chills or a general ill feeling. Get help right away if you:  Have severe pain.  See red streaks on your skin spreading away from the abscess. Summary  A skin abscess is an infected area on or under your skin that contains a collection of pus and other material.  A small abscess that drains on its own may not need treatment.  Treatment for larger abscesses may include having a procedure to drain the abscess and taking an antibiotic. This information is not intended to replace advice given to you by your health care provider. Make sure you discuss any questions you have with your health care provider. Document Revised: 08/21/2018 Document Reviewed: 06/13/2017 Elsevier Patient Education  2020 [employer] Inc[employer]  

## 2019-05-28 ENCOUNTER — Encounter: Payer: Self-pay | Admitting: Internal Medicine

## 2019-06-04 ENCOUNTER — Other Ambulatory Visit: Payer: Self-pay | Admitting: Internal Medicine

## 2019-06-04 DIAGNOSIS — S0501XA Injury of conjunctiva and corneal abrasion without foreign body, right eye, initial encounter: Secondary | ICD-10-CM | POA: Diagnosis not present

## 2019-06-04 DIAGNOSIS — E559 Vitamin D deficiency, unspecified: Secondary | ICD-10-CM

## 2019-06-05 DIAGNOSIS — S0501XD Injury of conjunctiva and corneal abrasion without foreign body, right eye, subsequent encounter: Secondary | ICD-10-CM | POA: Diagnosis not present

## 2019-06-06 ENCOUNTER — Encounter: Payer: Self-pay | Admitting: Internal Medicine

## 2019-06-09 ENCOUNTER — Ambulatory Visit: Payer: Medicare Other | Admitting: Internal Medicine

## 2019-06-10 ENCOUNTER — Ambulatory Visit: Payer: Medicare Other

## 2019-06-10 ENCOUNTER — Other Ambulatory Visit: Payer: Self-pay

## 2019-06-10 ENCOUNTER — Encounter: Payer: Self-pay | Admitting: Internal Medicine

## 2019-06-10 ENCOUNTER — Ambulatory Visit (INDEPENDENT_AMBULATORY_CARE_PROVIDER_SITE_OTHER): Payer: Medicare Other | Admitting: Internal Medicine

## 2019-06-10 VITALS — BP 132/84 | HR 56 | Temp 98.4°F | Ht 60.0 in | Wt 242.2 lb

## 2019-06-10 DIAGNOSIS — T502X5A Adverse effect of carbonic-anhydrase inhibitors, benzothiadiazides and other diuretics, initial encounter: Secondary | ICD-10-CM

## 2019-06-10 DIAGNOSIS — E876 Hypokalemia: Secondary | ICD-10-CM

## 2019-06-10 DIAGNOSIS — R7303 Prediabetes: Secondary | ICD-10-CM | POA: Diagnosis not present

## 2019-06-10 DIAGNOSIS — E559 Vitamin D deficiency, unspecified: Secondary | ICD-10-CM | POA: Diagnosis not present

## 2019-06-10 DIAGNOSIS — I10 Essential (primary) hypertension: Secondary | ICD-10-CM

## 2019-06-10 DIAGNOSIS — E538 Deficiency of other specified B group vitamins: Secondary | ICD-10-CM

## 2019-06-10 DIAGNOSIS — N61 Mastitis without abscess: Secondary | ICD-10-CM

## 2019-06-10 LAB — CBC WITH DIFFERENTIAL/PLATELET
Basophils Absolute: 0.1 10*3/uL (ref 0.0–0.1)
Basophils Relative: 0.4 % (ref 0.0–3.0)
Eosinophils Absolute: 0.2 10*3/uL (ref 0.0–0.7)
Eosinophils Relative: 1.2 % (ref 0.0–5.0)
HCT: 45 % (ref 36.0–46.0)
Hemoglobin: 14.8 g/dL (ref 12.0–15.0)
Lymphocytes Relative: 25.5 % (ref 12.0–46.0)
Lymphs Abs: 3.4 10*3/uL (ref 0.7–4.0)
MCHC: 32.9 g/dL (ref 30.0–36.0)
MCV: 96.8 fl (ref 78.0–100.0)
Monocytes Absolute: 1 10*3/uL (ref 0.1–1.0)
Monocytes Relative: 7.7 % (ref 3.0–12.0)
Neutro Abs: 8.6 10*3/uL — ABNORMAL HIGH (ref 1.4–7.7)
Neutrophils Relative %: 65.2 % (ref 43.0–77.0)
Platelets: 297 10*3/uL (ref 150.0–400.0)
RBC: 4.65 Mil/uL (ref 3.87–5.11)
RDW: 14.5 % (ref 11.5–15.5)
WBC: 13.2 10*3/uL — ABNORMAL HIGH (ref 4.0–10.5)

## 2019-06-10 LAB — BASIC METABOLIC PANEL
BUN: 10 mg/dL (ref 6–23)
CO2: 32 mEq/L (ref 19–32)
Calcium: 9.8 mg/dL (ref 8.4–10.5)
Chloride: 104 mEq/L (ref 96–112)
Creatinine, Ser: 0.78 mg/dL (ref 0.40–1.20)
GFR: 90.08 mL/min (ref >60.00)
Glucose, Bld: 98 mg/dL (ref 70–99)
Potassium: 4.1 mEq/L (ref 3.5–5.1)
Sodium: 143 mEq/L (ref 135–145)

## 2019-06-10 LAB — MICROALBUMIN / CREATININE URINE RATIO
Creatinine,U: 84.4 mg/dL
Microalb Creat Ratio: 0.8 mg/g (ref 0.0–30.0)
Microalb, Ur: <0.7 mg/dL (ref 0.0–1.9)

## 2019-06-10 LAB — VITAMIN D 25 HYDROXY (VIT D DEFICIENCY, FRACTURES): VITD: 46.04 ng/mL (ref 30.00–100.00)

## 2019-06-10 LAB — MAGNESIUM: Magnesium: 1.8 mg/dL (ref 1.5–2.5)

## 2019-06-10 LAB — HEMOGLOBIN A1C: Hgb A1c MFr Bld: 6 % (ref 4.6–6.5)

## 2019-06-10 MED ORDER — DOXYCYCLINE HYCLATE 100 MG PO TABS: 100.0000 mg | ORAL_TABLET | Freq: Two times a day (BID) | ORAL | 0 refills | Status: AC

## 2019-06-10 MED ORDER — CYANOCOBALAMIN 1000 MCG/ML IJ SOLN
1000.0000 ug | Freq: Once | INTRAMUSCULAR | Status: AC
  Administered 2019-06-10: 1000 ug via INTRAMUSCULAR

## 2019-06-10 MED ORDER — RIFAMPIN 300 MG PO CAPS: 300.0000 mg | ORAL_CAPSULE | Freq: Two times a day (BID) | ORAL | 0 refills | Status: DC

## 2019-06-10 NOTE — Patient Instructions (Signed)
Skin Abscess  A skin abscess is an infected area on or under your skin that contains a collection of pus and other material. An abscess may also be called a furuncle, carbuncle, or boil. An abscess can occur in or on almost any part of your body. Some abscesses break open (rupture) on their own. Most continue to get worse unless they are treated. The infection can spread deeper into the body and eventually into your blood, which can make you feel ill. Treatment usually involves draining the abscess. What are the causes? An abscess occurs when germs, like bacteria, pass through your skin and cause an infection. This may be caused by:  A scrape or cut on your skin.  A puncture wound through your skin, including a needle injection or insect bite.  Blocked oil or sweat glands.  Blocked and infected hair follicles.  A cyst that forms beneath your skin (sebaceous cyst) and becomes infected. What increases the risk? This condition is more likely to develop in people who:  Have a weak body defense system (immune system).  Have diabetes.  Have dry and irritated skin.  Get frequent injections or use illegal IV drugs.  Have a foreign body in a wound, such as a splinter.  Have problems with their lymph system or veins. What are the signs or symptoms? Symptoms of this condition include:  A painful, firm bump under the skin.  A bump with pus at the top. This may break through the skin and drain. Other symptoms include:  Redness surrounding the abscess site.  Warmth.  Swelling of the lymph nodes (glands) near the abscess.  Tenderness.  A sore on the skin. How is this diagnosed? This condition may be diagnosed based on:  A physical exam.  Your medical history.  A sample of pus. This may be used to find out what is causing the infection.  Blood tests.  Imaging tests, such as an ultrasound, CT scan, or MRI. How is this treated? A small abscess that drains on its own may  not need treatment. Treatment for larger abscesses may include:  Moist heat or heat pack applied to the area several times a day.  A procedure to drain the abscess (incision and drainage).  Antibiotic medicines. For a severe abscess, you may first get antibiotics through an IV and then change to antibiotics by mouth. Follow these instructions at home: Medicines   Take over-the-counter and prescription medicines only as told by your health care provider.  If you were prescribed an antibiotic medicine, take it as told by your health care provider. Do not stop taking the antibiotic even if you start to feel better. Abscess care   If you have an abscess that has not drained, apply heat to the affected area. Use the heat source that your health care provider recommends, such as a moist heat pack or a heating pad. ? Place a towel between your skin and the heat source. ? Leave the heat on for 20-30 minutes. ? Remove the heat if your skin turns bright red. This is especially important if you are unable to feel pain, heat, or cold. You may have a greater risk of getting burned.  Follow instructions from your health care provider about how to take care of your abscess. Make sure you: ? Cover the abscess with a bandage (dressing). ? Change your dressing or gauze as told by your health care provider. ? Wash your hands with soap and water before you change the   dressing or gauze. If soap and water are not available, use hand sanitizer.  Check your abscess every day for signs of a worsening infection. Check for: ? More redness, swelling, or pain. ? More fluid or blood. ? Warmth. ? More pus or a bad smell. General instructions  To avoid spreading the infection: ? Do not share personal care items, towels, or hot tubs with others. ? Avoid making skin contact with other people.  Keep all follow-up visits as told by your health care provider. This is important. Contact a health care provider if  you have:  More redness, swelling, or pain around your abscess.  More fluid or blood coming from your abscess.  Warm skin around your abscess.  More pus or a bad smell coming from your abscess.  A fever.  Muscle aches.  Chills or a general ill feeling. Get help right away if you:  Have severe pain.  See red streaks on your skin spreading away from the abscess. Summary  A skin abscess is an infected area on or under your skin that contains a collection of pus and other material.  A small abscess that drains on its own may not need treatment.  Treatment for larger abscesses may include having a procedure to drain the abscess and taking an antibiotic. This information is not intended to replace advice given to you by your health care provider. Make sure you discuss any questions you have with your health care provider. Document Revised: 08/21/2018 Document Reviewed: 06/13/2017 Elsevier Patient Education  2020 [employer] Inc[employer]  

## 2019-06-10 NOTE — Progress Notes (Signed)
Subjective:  Patient ID: April May, female    DOB: 1955-06-09  Age: 64 y.o. MRN: [id number]  CC: Hypertension  This visit occurred during the SARS-CoV-2 public health emergency.  Safety protocols were in place, including screening questions prior to the visit, additional usage of staff PPE, and extensive cleaning of exam room while observing appropriate contact time as indicated for disinfecting solutions.    HPI [facility] presents for f/up - I saw her recently for a subcutaneous infection in her left breast.  I thought this was MRSA so I will prescribed sulfamethoxazole/ trimethoprim.  She tells me the area has gotten somewhat better on that regimen but she still complains of a small area of redness and swelling in her left breast.  She also tells me that since I last saw her she developed a new lesion on her left inner thigh.  It got red and swollen and over the last few days drained some yellow pus.  Outpatient Medications Prior to Visit  Medication Sig Dispense Refill  . amLODipine (NORVASC) 5 MG tablet Take 10 mg by mouth daily.    . budesonide (ENTOCORT EC) 3 MG 24 hr capsule TAKE 3 CAPSULES BY MOUTH  DAILY 270 capsule 0  . BYSTOLIC 10 MG tablet TAKE 1 TABLET BY MOUTH  DAILY 90 tablet 1  . Cholecalciferol (VITAMIN D3) 1.25 MG (50000 UT) CAPS TAKE 1 CAPSULE BY MOUTH ONE TIME PER WEEK 4 capsule 0  . clonazePAM (KLONOPIN) 0.5 MG tablet Take 1 tablet (0.5 mg total) by mouth 2 (two) times daily as needed for anxiety. 60 tablet 2  . [facility] PAIN RELIEF EXTRA STRENGTH 500 MG tablet TAKE 1 TABLET BY MOUTH EVERY 8 HOURS AS NEEDED FOR MODERATE PAIN 60 tablet 5  . fluticasone (FLONASE) 50 MCG/ACT nasal spray USE 2 SPRAYS INTO BOTH  NOSTRILS DAILY AS NEEDED  FOR ALLERGIES OR RHINITIS. 32 g 3  . furosemide (LASIX) 40 MG tablet Take 40 mg by mouth every other day.     . Indacaterol-Glycopyrrolate (UTIBRON NEOHALER) 27.5-15.6 MCG CAPS Place 1 Act into inhaler and inhale 2  (two) times daily. (Patient taking differently: Place 1 Act into inhaler and inhale as needed. ) 60 capsule 11  . lamoTRIgine (LAMICTAL) 200 MG tablet TAKE 1 TABLET BY MOUTH AT  BEDTIME 90 tablet 1  . Potassium Citrate 15 MEQ (1620 MG) TBCR Take 1 tablet by mouth 2 (two) times daily. 180 tablet 1  . PROAIR HFA 108 (90 Base) MCG/ACT inhaler TAKE 2 PUFFS BY MOUTH EVERY 6 HOURS AS NEEDED FOR WHEEZE OR SHORTNESS OF BREATH 18 g 5  . spironolactone (ALDACTONE) 100 MG tablet 50 mg.     . vortioxetine HBr (TRINTELLIX) 10 MG TABS tablet Take 1 tablet (10 mg total) by mouth daily. 90 tablet 0  . tobramycin (TOBREX) 0.3 % ophthalmic solution      No facility-administered medications prior to visit.    ROS Review of Systems  Constitutional: Negative.  Negative for chills, fatigue and fever.  HENT: Negative.   Eyes: Negative for visual disturbance.  Respiratory: Negative for cough, chest tightness, shortness of breath and wheezing.   Cardiovascular: Negative for chest pain, palpitations and leg swelling.  Gastrointestinal: Negative for abdominal pain, constipation, diarrhea, nausea and vomiting.  Endocrine: Negative.   Genitourinary: Negative.  Negative for difficulty urinating.  Musculoskeletal: Negative.  Negative for arthralgias and myalgias.  Skin: Positive for rash. Negative for color change.  Neurological: Negative.  Negative for  dizziness.  Hematological: Negative for adenopathy. Does not bruise/bleed easily.  Psychiatric/Behavioral: Negative.     Objective:  BP 132/84 (BP Location: Left Arm, Patient Position: Sitting, Cuff Size: Normal)   Pulse (!) 56   Temp 98.4 F (36.9 C) (Oral)   Ht 5' (1.524 m)   Wt 242 lb 4 oz (109.9 kg)   LMP 05/14/1988   SpO2 98%   BMI 47.31 kg/m   BP Readings from Last 3 Encounters:  06/10/19 132/84  05/19/19 (!) 142/84  04/08/19 (!) 144/90    Wt Readings from Last 3 Encounters:  06/10/19 242 lb 4 oz (109.9 kg)  05/19/19 241 lb (109.3 kg)    04/08/19 242 lb 6.4 oz (110 kg)    Physical Exam Vitals reviewed.  Constitutional:      Appearance: She is obese.  HENT:     Nose: Nose normal.     Mouth/Throat:     Mouth: Mucous membranes are moist.  Eyes:     General: No scleral icterus.    Conjunctiva/sclera: Conjunctivae normal.  Cardiovascular:     Rate and Rhythm: Normal rate and regular rhythm.     Heart sounds: No murmur.  Pulmonary:     Effort: Pulmonary effort is normal.     Breath sounds: No stridor. No wheezing, rhonchi or rales.  Chest:    Abdominal:     General: Abdomen is protuberant. Bowel sounds are normal. There is no distension.     Palpations: Abdomen is soft. There is no hepatomegaly or splenomegaly.  Musculoskeletal:        General: Normal range of motion.     Cervical back: Neck supple.     Left lower leg: No edema.       Legs:  Lymphadenopathy:     Cervical: No cervical adenopathy.  Skin:    General: Skin is warm.     Findings: No rash.  Neurological:     General: No focal deficit present.     Mental Status: She is alert.  Psychiatric:        Mood and Affect: Mood normal.        Behavior: Behavior normal.     Lab Results  Component Value Date   WBC 13.2 (H) 06/10/2019   HGB 14.8 06/10/2019   HCT 45.0 06/10/2019   PLT 297.0 06/10/2019   GLUCOSE 98 06/10/2019   CHOL 116 04/08/2019   TRIG 88.0 04/08/2019   HDL 44.70 04/08/2019   LDLCALC 53 04/08/2019   ALT 25 10/10/2018   AST 17 10/10/2018   NA 143 06/10/2019   K 4.1 06/10/2019   CL 104 06/10/2019   CREATININE 0.78 06/10/2019   BUN 10 06/10/2019   CO2 32 06/10/2019   TSH 1.16 04/08/2019   HGBA1C 6.0 06/10/2019   MICROALBUR <0.7 06/10/2019    MM 3D SCREEN BREAST BILATERAL  Result Date: 10/23/2018 CLINICAL DATA:  Screening. EXAM: DIGITAL SCREENING BILATERAL MAMMOGRAM WITH TOMO AND CAD COMPARISON:  Previous exam(s). ACR Breast Density Category b: There are scattered areas of fibroglandular density. FINDINGS: There are no  findings suspicious for malignancy. Images were processed with CAD. IMPRESSION: No mammographic evidence of malignancy. A result letter of this screening mammogram will be mailed directly to the patient. RECOMMENDATION: Screening mammogram in one year. (Code:SM-B-01Y) BI-RADS CATEGORY  1: Negative. Electronically Signed   By: Abelardo Diesel M.D.   On: 10/23/2018 10:39    Assessment & Plan:   Kamyla was seen today for hypertension.  Diagnoses and  all orders for this visit:  Malignant hypertension- Her blood pressure is adequately well controlled.  Electrolytes and renal function are normal. -     CBC with Differential/Platelet -     Basic metabolic panel -     Magnesium -     Microalbumin / creatinine urine ratio  Prediabetes- Her A1c is at 6.0%.  Medical therapy is not indicated. -     Basic metabolic panel -     Hemoglobin A1c  Diuretic-induced hypokalemia- Her potassium level is normal now.  Will continue the current dose of spironolactone. -     Magnesium  Vitamin D deficiency- Her vitamin D level is normal now. -     VITAMIN D 25 Hydroxy (Vit-D Deficiency, Fractures)  Cellulitis of left breast- This is a recurrent infection.  I am concerned she may have a reservoir of MRSA in her nose or under her fingernails.  I have therefore recommended that she take a 10-day course of doxycycline and rifampin. -     doxycycline (VIBRA-TABS) 100 MG tablet; Take 1 tablet (100 mg total) by mouth 2 (two) times daily for 10 days. -     rifampin (RIFADIN) 300 MG capsule; Take 1 capsule (300 mg total) by mouth 2 (two) times daily for 10 days.  B12 deficiency -     cyanocobalamin ((VITAMIN B-12)) injection 1,000 mcg   I am having Chrislynn W. Dolinger start on doxycycline and rifampin. I am also having her maintain her fluticasone, Indacaterol-Glycopyrrolate, furosemide, [facility]S Pain Relief Extra Strength, budesonide, spironolactone, clonazePAM, Bystolic, lamoTRIgine, ProAir HFA, Potassium Citrate,  vortioxetine HBr, amLODipine, Vitamin D3, and tobramycin. We administered cyanocobalamin.  Meds ordered this encounter  Medications  . doxycycline (VIBRA-TABS) 100 MG tablet    Sig: Take 1 tablet (100 mg total) by mouth 2 (two) times daily for 10 days.    Dispense:  20 tablet    Refill:  0  . rifampin (RIFADIN) 300 MG capsule    Sig: Take 1 capsule (300 mg total) by mouth 2 (two) times daily for 10 days.    Dispense:  20 capsule    Refill:  0  . cyanocobalamin ((VITAMIN B-12)) injection 1,000 mcg     Follow-up: Return in about 3 weeks (around 07/01/2019).  Scarlette Calico, MD

## 2019-06-12 DIAGNOSIS — S0501XD Injury of conjunctiva and corneal abrasion without foreign body, right eye, subsequent encounter: Secondary | ICD-10-CM | POA: Diagnosis not present

## 2019-06-16 ENCOUNTER — Other Ambulatory Visit: Payer: Self-pay | Admitting: Gastroenterology

## 2019-06-16 ENCOUNTER — Encounter: Payer: Self-pay | Admitting: Internal Medicine

## 2019-06-16 DIAGNOSIS — N61 Mastitis without abscess: Secondary | ICD-10-CM

## 2019-06-17 MED ORDER — RIFAMPIN 300 MG PO CAPS: 300.0000 mg | ORAL_CAPSULE | Freq: Two times a day (BID) | ORAL | 0 refills | Status: AC

## 2019-06-19 ENCOUNTER — Telehealth: Payer: Self-pay | Admitting: Gastroenterology

## 2019-06-19 MED ORDER — BUDESONIDE 3 MG PO CPEP: 9.0000 mg | ORAL_CAPSULE | Freq: Every day | ORAL | 1 refills | Status: DC

## 2019-06-19 NOTE — Telephone Encounter (Signed)
Prescription sent to patient's pharmacy until scheduled appt.

## 2019-06-25 DIAGNOSIS — L03818 Cellulitis of other sites: Secondary | ICD-10-CM | POA: Diagnosis not present

## 2019-06-25 DIAGNOSIS — Z1283 Encounter for screening for malignant neoplasm of skin: Secondary | ICD-10-CM | POA: Diagnosis not present

## 2019-06-28 ENCOUNTER — Other Ambulatory Visit: Payer: Self-pay | Admitting: Internal Medicine

## 2019-06-29 ENCOUNTER — Other Ambulatory Visit: Payer: Self-pay | Admitting: Internal Medicine

## 2019-06-29 ENCOUNTER — Telehealth: Payer: Self-pay

## 2019-06-29 DIAGNOSIS — I10 Essential (primary) hypertension: Secondary | ICD-10-CM

## 2019-06-29 DIAGNOSIS — E559 Vitamin D deficiency, unspecified: Secondary | ICD-10-CM

## 2019-06-29 MED ORDER — AMLODIPINE BESYLATE 5 MG PO TABS: 5.0000 mg | ORAL_TABLET | Freq: Every day | ORAL | 1 refills | Status: DC

## 2019-06-29 NOTE — Telephone Encounter (Signed)
New message  Amlodipine 10 mg    1. Which medications need to be refilled? (please list name of each medication and dose if known) amLODipine (NORVASC) 5 MG tablet  2. Which pharmacy/location (including street and city if local pharmacy) is medication to be sent to? [facility] [place]   3. Do they need a 30 day or 90 day supply? 90 day supply

## 2019-07-05 ENCOUNTER — Other Ambulatory Visit: Payer: Self-pay | Admitting: Internal Medicine

## 2019-07-13 ENCOUNTER — Encounter: Payer: Self-pay | Admitting: Internal Medicine

## 2019-07-13 ENCOUNTER — Ambulatory Visit (INDEPENDENT_AMBULATORY_CARE_PROVIDER_SITE_OTHER): Payer: Medicare Other | Admitting: *Deleted

## 2019-07-13 ENCOUNTER — Other Ambulatory Visit: Payer: Self-pay

## 2019-07-13 ENCOUNTER — Ambulatory Visit: Payer: Medicare Other

## 2019-07-13 DIAGNOSIS — E538 Deficiency of other specified B group vitamins: Secondary | ICD-10-CM | POA: Diagnosis not present

## 2019-07-13 MED ORDER — CYANOCOBALAMIN 1000 MCG/ML IJ SOLN
1000.0000 ug | Freq: Once | INTRAMUSCULAR | Status: AC
  Administered 2019-07-13: 1000 ug via INTRAMUSCULAR

## 2019-07-13 NOTE — Progress Notes (Signed)
Pls cosign for pt B12 since PCP is out of the office.Marland KitchenJohny Chess

## 2019-07-17 ENCOUNTER — Other Ambulatory Visit: Payer: Self-pay | Admitting: Internal Medicine

## 2019-07-17 ENCOUNTER — Telehealth: Payer: Self-pay

## 2019-07-17 DIAGNOSIS — F332 Major depressive disorder, recurrent severe without psychotic features: Secondary | ICD-10-CM

## 2019-07-17 MED ORDER — BUPROPION HCL ER (SR) 100 MG PO TB12: 100.0000 mg | ORAL_TABLET | Freq: Two times a day (BID) | ORAL | 1 refills | Status: DC

## 2019-07-17 NOTE — Telephone Encounter (Signed)
New message    The patient is calling did not receive the my chart message from the nurse.    The patient would like to try a different type of mental health medication.     [facility] [address]

## 2019-07-17 NOTE — Telephone Encounter (Deleted)
error 

## 2019-07-18 ENCOUNTER — Ambulatory Visit: Payer: Medicare Other | Attending: Internal Medicine

## 2019-07-18 DIAGNOSIS — Z23 Encounter for immunization: Secondary | ICD-10-CM | POA: Insufficient documentation

## 2019-07-18 NOTE — Progress Notes (Signed)
   Covid-19 Vaccination Clinic  Name:  April May    MRN: [id number] DOB: 02-Nov-1955  07/18/2019  April May was observed post Covid-19 immunization for 15 minutes without incident. She was provided with Vaccine Information Sheet and instruction to access the V-Safe system.   April May was instructed to call 911 with any severe reactions post vaccine: Marland Kitchen Difficulty breathing  . Swelling of face and throat  . A fast heartbeat  . A bad rash all over body  . Dizziness and weakness   Immunizations Administered    Name Date Dose VIS Date Route   Pfizer COVID-19 Vaccine 07/18/2019 10:37 AM 0.3 mL 04/24/2019 Intramuscular   Manufacturer: [place]   Lot: [id number]   [place]: [id number]

## 2019-07-23 ENCOUNTER — Other Ambulatory Visit: Payer: Self-pay

## 2019-07-23 DIAGNOSIS — E559 Vitamin D deficiency, unspecified: Secondary | ICD-10-CM

## 2019-07-23 MED ORDER — VITAMIN D3 1.25 MG (50000 UT) PO CAPS: 1.0000 | ORAL_CAPSULE | ORAL | 3 refills | Status: DC

## 2019-07-27 ENCOUNTER — Ambulatory Visit: Payer: Medicare Other | Admitting: Gastroenterology

## 2019-07-27 ENCOUNTER — Encounter: Payer: Self-pay | Admitting: Gastroenterology

## 2019-07-27 VITALS — BP 130/78 | HR 63 | Temp 98.5°F | Ht 60.0 in | Wt 238.0 lb

## 2019-07-27 DIAGNOSIS — K5000 Crohn's disease of small intestine without complications: Secondary | ICD-10-CM

## 2019-07-27 MED ORDER — BUDESONIDE 3 MG PO CPEP: 9.0000 mg | ORAL_CAPSULE | Freq: Every day | ORAL | 3 refills | Status: DC

## 2019-07-27 NOTE — Patient Instructions (Signed)
We have sent the following prescriptions to your mail in pharmacy: budeosonide   If you have not heard from your mail in pharmacy within 1 week or if you have not received your medication in the mail, please contact us at [phone] so we may find out why.  Thank you for choosing me and [facility].  Pricilla Riffle. Dagoberto Ligas., MD., Marval Regal

## 2019-07-27 NOTE — Progress Notes (Signed)
    History of Present Illness: This is a 64 year old female with Crohn's ileitis.  She relates intermittent mild constipation otherwise no GI complaints.  She is receiving B12 monthly through her PCPs office.  Blood work in January was unremarkable however no LFTs obtained.  Current Medications, Allergies, Past Medical History, Past Surgical History, Family History and Social History were reviewed in [employer] record.   Physical Exam: General: Well developed, well nourished, no acute distress Head: Normocephalic and atraumatic Eyes:  sclerae anicteric, EOMI Ears: Normal auditory acuity Mouth: Not examined, mask on during Covid-19 pandemic Lungs: Clear throughout to auscultation Heart: Regular rate and rhythm; no murmurs, rubs or bruits Abdomen: Soft, non tender and non distended. No masses, hepatosplenomegaly or hernias noted. Normal Bowel sounds Rectal: Not done Musculoskeletal: Symmetrical with no gross deformities  Pulses:  Normal pulses noted Extremities: No clubbing, cyanosis, edema or deformities noted Neurological: Alert oriented x 4, grossly nonfocal Psychological:  Alert and cooperative. Normal mood and affect   Assessment and Recommendations:  1. Crohn's ileitis. S/P TI and right colon resection in 2016.  Continue Entocort 9 mg daily.  Colonoscopy recommended in May 2024. Blood work with PCP.  REV in 1 year.   2.  B12 deficiency.  Replacement per PCP.  3.  Mild constipation.  MiraLAX daily as needed.

## 2019-07-28 ENCOUNTER — Ambulatory Visit (INDEPENDENT_AMBULATORY_CARE_PROVIDER_SITE_OTHER): Payer: Medicare Other | Admitting: Internal Medicine

## 2019-07-28 ENCOUNTER — Encounter: Payer: Self-pay | Admitting: Internal Medicine

## 2019-07-28 ENCOUNTER — Other Ambulatory Visit: Payer: Self-pay

## 2019-07-28 VITALS — BP 142/78 | HR 60 | Temp 98.2°F | Resp 16 | Ht 60.0 in | Wt 246.0 lb

## 2019-07-28 DIAGNOSIS — E876 Hypokalemia: Secondary | ICD-10-CM

## 2019-07-28 DIAGNOSIS — T502X5A Adverse effect of carbonic-anhydrase inhibitors, benzothiadiazides and other diuretics, initial encounter: Secondary | ICD-10-CM | POA: Diagnosis not present

## 2019-07-28 DIAGNOSIS — I10 Essential (primary) hypertension: Secondary | ICD-10-CM | POA: Diagnosis not present

## 2019-07-28 DIAGNOSIS — F332 Major depressive disorder, recurrent severe without psychotic features: Secondary | ICD-10-CM | POA: Diagnosis not present

## 2019-07-28 LAB — CBC WITH DIFFERENTIAL/PLATELET
Basophils Absolute: 0 10*3/uL (ref 0.0–0.1)
Basophils Relative: 0.3 % (ref 0.0–3.0)
Eosinophils Absolute: 0.2 10*3/uL (ref 0.0–0.7)
Eosinophils Relative: 1.2 % (ref 0.0–5.0)
HCT: 45.1 % (ref 36.0–46.0)
Hemoglobin: 15 g/dL (ref 12.0–15.0)
Lymphocytes Relative: 24.5 % (ref 12.0–46.0)
Lymphs Abs: 3.7 10*3/uL (ref 0.7–4.0)
MCHC: 33.2 g/dL (ref 30.0–36.0)
MCV: 96.7 fl (ref 78.0–100.0)
Monocytes Absolute: 1.2 10*3/uL — ABNORMAL HIGH (ref 0.1–1.0)
Monocytes Relative: 7.7 % (ref 3.0–12.0)
Neutro Abs: 9.9 10*3/uL — ABNORMAL HIGH (ref 1.4–7.7)
Neutrophils Relative %: 66.3 % (ref 43.0–77.0)
Platelets: 273 10*3/uL (ref 150.0–400.0)
RBC: 4.67 Mil/uL (ref 3.87–5.11)
RDW: 15.4 % (ref 11.5–15.5)
WBC: 15 10*3/uL — ABNORMAL HIGH (ref 4.0–10.5)

## 2019-07-28 LAB — BASIC METABOLIC PANEL
BUN: 9 mg/dL (ref 6–23)
CO2: 36 mEq/L — ABNORMAL HIGH (ref 19–32)
Calcium: 9.7 mg/dL (ref 8.4–10.5)
Chloride: 99 mEq/L (ref 96–112)
Creatinine, Ser: 0.76 mg/dL (ref 0.40–1.20)
GFR: 92.78 mL/min (ref >60.00)
Glucose, Bld: 112 mg/dL — ABNORMAL HIGH (ref 70–99)
Potassium: 3.3 mEq/L — ABNORMAL LOW (ref 3.5–5.1)
Sodium: 142 mEq/L (ref 135–145)

## 2019-07-28 MED ORDER — DULOXETINE HCL 30 MG PO CPEP: 30.0000 mg | ORAL_CAPSULE | Freq: Every day | ORAL | 0 refills | Status: DC

## 2019-07-28 MED ORDER — AMLODIPINE BESYLATE 10 MG PO TABS: 10.0000 mg | ORAL_TABLET | Freq: Every day | ORAL | 1 refills | Status: DC

## 2019-07-28 MED ORDER — BUPROPION HCL ER (XL) 150 MG PO TB24: 150.0000 mg | ORAL_TABLET | Freq: Every day | ORAL | 1 refills | Status: DC

## 2019-07-28 NOTE — Progress Notes (Addendum)
Subjective:  Patient ID: April May, female    DOB: 03/10/1956  Age: 64 y.o. MRN: [id number]  CC: Hypertension and Depression  This visit occurred during the SARS-CoV-2 public health emergency.  Safety protocols were in place, including screening questions prior to the visit, additional usage of staff PPE, and extensive cleaning of exam room while observing appropriate contact time as indicated for disinfecting solutions.    HPI [facility] presents for f/up - She wants to continue taking bupropion but she does not like to twice daily dosage and wants to switch to a once a day dosage.  She is not taking Trintellix because it made her feel irritable.  She had the same symptoms when she took Prozac.  She continues to struggle with depression and by this she describes a sensation of lack of focus and decreased concentration.  She tells me she sleeps some during the 1 day and is awake at night.  She complains of weight gain and constipation.  She tells me the constipation is adequately treated with MiraLAX.  She is not currently exercising when she is active denies any episodes of CP, DOE, palpitations, or edema.  Outpatient Medications Prior to Visit  Medication Sig Dispense Refill  . budesonide (ENTOCORT EC) 3 MG 24 hr capsule Take 3 capsules (9 mg total) by mouth daily. 180 capsule 3  . BYSTOLIC 10 MG tablet TAKE 1 TABLET BY MOUTH  DAILY 90 tablet 1  . Cholecalciferol (VITAMIN D3) 1.25 MG (50000 UT) CAPS Take 1 capsule by mouth once a week. 12 capsule 3  . clonazePAM (KLONOPIN) 0.5 MG tablet Take 1 tablet (0.5 mg total) by mouth 2 (two) times daily as needed for anxiety. 60 tablet 2  . [employer] PAIN RELIEF EXTRA STRENGTH 500 MG tablet TAKE 1 TABLET BY MOUTH EVERY 8 HOURS AS NEEDED FOR MODERATE PAIN 60 tablet 5  . fluticasone (FLONASE) 50 MCG/ACT nasal spray USE 2 SPRAYS INTO BOTH  NOSTRILS DAILY AS NEEDED  FOR ALLERGIES OR RHINITIS. 32 g 3  . furosemide (LASIX) 40 MG tablet  Take 40 mg by mouth every other day.     . lamoTRIgine (LAMICTAL) 200 MG tablet TAKE 1 TABLET BY MOUTH AT  BEDTIME 90 tablet 1  . mupirocin ointment (BACTROBAN) 2 % SMARTSIG:1 Application Topical 2-3 Times Daily    . PROAIR HFA 108 (90 Base) MCG/ACT inhaler TAKE 2 PUFFS BY MOUTH EVERY 6 HOURS AS NEEDED FOR WHEEZE OR SHORTNESS OF BREATH 18 g 5  . tobramycin (TOBREX) 0.3 % ophthalmic solution     . amLODipine (NORVASC) 5 MG tablet Take 1 tablet (5 mg total) by mouth daily. 90 tablet 1  . buPROPion (WELLBUTRIN SR) 100 MG 12 hr tablet Take 1 tablet (100 mg total) by mouth 2 (two) times daily. 180 tablet 1  . Indacaterol-Glycopyrrolate (UTIBRON NEOHALER) 27.5-15.6 MCG CAPS Place 1 Act into inhaler and inhale 2 (two) times daily. (Patient taking differently: Place 1 Act into inhaler and inhale as needed. ) 60 capsule 11  . Potassium Citrate 15 MEQ (1620 MG) TBCR Take 1 tablet by mouth 2 (two) times daily. 180 tablet 1  . spironolactone (ALDACTONE) 100 MG tablet 50 mg.     . vortioxetine HBr (TRINTELLIX) 10 MG TABS tablet Take 1 tablet (10 mg total) by mouth daily. 90 tablet 0   No facility-administered medications prior to visit.    ROS Review of Systems  Constitutional: Positive for unexpected weight change (wt gain). Negative for  appetite change, diaphoresis and fatigue.  HENT: Negative.   Eyes: Negative for visual disturbance.  Respiratory: Negative for cough, chest tightness, shortness of breath and wheezing.   Cardiovascular: Negative for chest pain, palpitations and leg swelling.  Gastrointestinal: Positive for constipation. Negative for abdominal pain, diarrhea, nausea and vomiting.  Endocrine: Negative.   Genitourinary: Negative.  Negative for difficulty urinating.  Musculoskeletal: Negative.   Skin: Negative.  Negative for pallor and rash.  Neurological: Negative for dizziness, syncope, weakness and light-headedness.  Hematological: Negative for adenopathy. Does not bruise/bleed  easily.  Psychiatric/Behavioral: Positive for decreased concentration, dysphoric mood and sleep disturbance. Negative for agitation, behavioral problems, confusion, self-injury and suicidal ideas. The patient is nervous/anxious.     Objective:  BP (!) 142/78 (BP Location: Left Arm, Patient Position: Sitting, Cuff Size: Large)   Pulse 60   Temp 98.2 F (36.8 C) (Oral)   Resp 16   Ht 5' (1.524 m)   Wt 246 lb (111.6 kg)   LMP 05/14/1988   HC 16" (40.6 cm)   SpO2 97%   BMI 48.04 kg/m   BP Readings from Last 3 Encounters:  07/28/19 (!) 142/78  07/27/19 130/78  06/10/19 132/84    Wt Readings from Last 3 Encounters:  07/28/19 246 lb (111.6 kg)  07/27/19 238 lb (108 kg)  06/10/19 242 lb 4 oz (109.9 kg)    Physical Exam Vitals reviewed.  Constitutional:      Appearance: Normal appearance.  HENT:     Nose: Nose normal.     Mouth/Throat:     Mouth: Mucous membranes are moist.  Eyes:     General: No scleral icterus.    Conjunctiva/sclera: Conjunctivae normal.  Cardiovascular:     Rate and Rhythm: Normal rate and regular rhythm.     Heart sounds: Murmur present. Systolic murmur present with a grade of 1/6. No diastolic murmur. No gallop.   Pulmonary:     Effort: Pulmonary effort is normal.     Breath sounds: No stridor. No wheezing, rhonchi or rales.  Abdominal:     General: Abdomen is protuberant. Bowel sounds are normal. There is no distension.     Palpations: Abdomen is soft. There is no hepatomegaly, splenomegaly or mass.     Tenderness: There is no abdominal tenderness.  Musculoskeletal:        General: Normal range of motion.     Cervical back: Neck supple.     Right lower leg: No edema.     Left lower leg: No edema.  Lymphadenopathy:     Cervical: No cervical adenopathy.  Skin:    General: Skin is warm and dry.     Coloration: Skin is not pale.  Neurological:     General: No focal deficit present.     Mental Status: She is alert and oriented to person, place,  and time. Mental status is at baseline.  Psychiatric:        Attention and Perception: Attention normal.        Mood and Affect: Mood is anxious and depressed. Mood is not elated. Affect is not labile, blunt, flat, angry, tearful or inappropriate.        Speech: Speech normal.        Behavior: Behavior normal.        Thought Content: Thought content normal. Thought content is not paranoid or delusional. Thought content does not include homicidal or suicidal ideation.        Cognition and Memory: Cognition normal.  Lab Results  Component Value Date   WBC 15.0 (H) 07/28/2019   HGB 15.0 07/28/2019   HCT 45.1 07/28/2019   PLT 273.0 07/28/2019   GLUCOSE 112 (H) 07/28/2019   CHOL 116 04/08/2019   TRIG 88.0 04/08/2019   HDL 44.70 04/08/2019   LDLCALC 53 04/08/2019   ALT 25 10/10/2018   AST 17 10/10/2018   NA 142 07/28/2019   K 3.3 (L) 07/28/2019   CL 99 07/28/2019   CREATININE 0.76 07/28/2019   BUN 9 07/28/2019   CO2 36 (H) 07/28/2019   TSH 1.16 04/08/2019   HGBA1C 6.0 06/10/2019   MICROALBUR <0.7 06/10/2019    MM 3D SCREEN BREAST BILATERAL  Result Date: 10/23/2018 CLINICAL DATA:  Screening. EXAM: DIGITAL SCREENING BILATERAL MAMMOGRAM WITH TOMO AND CAD COMPARISON:  Previous exam(s). ACR Breast Density Category b: There are scattered areas of fibroglandular density. FINDINGS: There are no findings suspicious for malignancy. Images were processed with CAD. IMPRESSION: No mammographic evidence of malignancy. A result letter of this screening mammogram will be mailed directly to the patient. RECOMMENDATION: Screening mammogram in one year. (Code:SM-B-01Y) BI-RADS CATEGORY  1: Negative. Electronically Signed   By: Abelardo Diesel M.D.   On: 10/23/2018 10:39    Assessment & Plan:   Marjorie was seen today for hypertension and depression.  Diagnoses and all orders for this visit:  Malignant hypertension- Her blood pressure is not quite adequately well controlled.  I have asked her to  improve her lifestyle modifications.  Will continue spironolactone and amlodipine. -     amLODipine (NORVASC) 10 MG tablet; Take 1 tablet (10 mg total) by mouth daily. -     CBC with Differential/Platelet -     Basic metabolic panel -     Potassium Citrate 15 MEQ (1620 MG) TBCR; Take 1 tablet by mouth in the morning, at noon, and at bedtime. -     spironolactone (ALDACTONE) 100 MG tablet; Take 0.5 tablets (50 mg total) by mouth daily.  Severe episode of recurrent major depressive disorder, without psychotic features ([place])- Will change the bupropion to a once daily dosing and will try duloxetine. -     buPROPion (WELLBUTRIN XL) 150 MG 24 hr tablet; Take 1 tablet (150 mg total) by mouth daily. -     DULoxetine (CYMBALTA) 30 MG capsule; Take 1 capsule (30 mg total) by mouth daily.  Diuretic-induced hypokalemia- Her potassium level remains low.  I have asked her to increase her potassium supplement to 3 times a day and to be compliant with spironolactone.  She was asked to return in 4 to 6 weeks to recheck her potassium level. -     Potassium Citrate 15 MEQ (1620 MG) TBCR; Take 1 tablet by mouth in the morning, at noon, and at bedtime. -     spironolactone (ALDACTONE) 100 MG tablet; Take 0.5 tablets (50 mg total) by mouth daily.   I have discontinued Cannie W. Kozicki's Indacaterol-Glycopyrrolate, vortioxetine HBr, amLODipine, and buPROPion. I have also changed her Potassium Citrate and spironolactone. Additionally, I am having her start on amLODipine, buPROPion, and DULoxetine. Lastly, I am having her maintain her fluticasone, furosemide, [employer] Pain Relief Extra Strength, clonazePAM, Bystolic, lamoTRIgine, ProAir HFA, tobramycin, Vitamin D3, budesonide, and mupirocin ointment.  Meds ordered this encounter  Medications  . amLODipine (NORVASC) 10 MG tablet    Sig: Take 1 tablet (10 mg total) by mouth daily.    Dispense:  90 tablet    Refill:  1  . buPROPion (  WELLBUTRIN XL) 150 MG 24 hr tablet     Sig: Take 1 tablet (150 mg total) by mouth daily.    Dispense:  90 tablet    Refill:  1  . DULoxetine (CYMBALTA) 30 MG capsule    Sig: Take 1 capsule (30 mg total) by mouth daily.    Dispense:  90 capsule    Refill:  0  . Potassium Citrate 15 MEQ (1620 MG) TBCR    Sig: Take 1 tablet by mouth in the morning, at noon, and at bedtime.    Dispense:  270 tablet    Refill:  1  . spironolactone (ALDACTONE) 100 MG tablet    Sig: Take 0.5 tablets (50 mg total) by mouth daily.    Dispense:  90 tablet    Refill:  0     Follow-up: Return in about 3 months (around 10/28/2019).  Scarlette Calico, MD

## 2019-07-28 NOTE — Patient Instructions (Signed)
Major Depressive Disorder, Adult Major depressive disorder (MDD) is a mental health condition. It may also be called clinical depression or unipolar depression. MDD usually causes feelings of sadness, hopelessness, or helplessness. MDD can also cause physical symptoms. It can interfere with work, school, relationships, and other everyday activities. MDD may be mild, moderate, or severe. It may occur once (single episode major depressive disorder) or it may occur multiple times (recurrent major depressive disorder). What are the causes? The exact cause of this condition is not known. MDD is most likely caused by a combination of things, which may include:  Genetic factors. These are traits that are passed along from parent to child.  Individual factors. Your personality, your behavior, and the way you handle your thoughts and feelings may contribute to MDD. This includes personality traits and behaviors learned from others.  Physical factors, such as: ? Differences in the part of your brain that controls emotion. This part of your brain may be different than it is in people who do not have MDD. ? Long-term (chronic) medical or psychiatric illnesses.  Social factors. Traumatic experiences or major life changes may play a role in the development of MDD. What increases the risk? This condition is more likely to develop in women. The following factors may also make you more likely to develop MDD:  A family history of depression.  Troubled family relationships.  Abnormally low levels of certain brain chemicals.  Traumatic events in childhood, especially abuse or the loss of a parent.  Being under a lot of stress, or long-term stress, especially from upsetting life experiences or losses.  A history of: ? Chronic physical illness. ? Other mental health disorders. ? Substance abuse.  Poor living conditions.  Experiencing social exclusion or discrimination on a regular basis. What are the  signs or symptoms? The main symptoms of MDD typically include:  Constant depressed or irritable mood.  Loss of interest in things and activities. MDD symptoms may also include:  Sleeping or eating too much or too little.  Unexplained weight change.  Fatigue or low energy.  Feelings of worthlessness or guilt.  Difficulty thinking clearly or making decisions.  Thoughts of suicide or of harming others.  Physical agitation or weakness.  Isolation. Severe cases of MDD may also occur with other symptoms, such as:  Delusions or hallucinations, in which you imagine things that are not real (psychotic depression).  Low-level depression that lasts at least a year (chronic depression or persistent depressive disorder).  Extreme sadness and hopelessness (melancholic depression).  Trouble speaking and moving (catatonic depression). How is this diagnosed? This condition may be diagnosed based on:  Your symptoms.  Your medical history, including your mental health history. This may involve tests to evaluate your mental health. You may be asked questions about your lifestyle, including any drug and alcohol use, and how long you have had symptoms of MDD.  A physical exam.  Blood tests to rule out other conditions. You must have a depressed mood and at least four other MDD symptoms most of the day, nearly every day in the same 2-week timeframe before your health care provider can confirm a diagnosis of MDD. How is this treated? This condition is usually treated by mental health professionals, such as psychologists, psychiatrists, and clinical social workers. You may need more than one type of treatment. Treatment may include:  Psychotherapy. This is also called talk therapy or counseling. Types of psychotherapy include: ? Cognitive behavioral therapy (CBT). This type of therapy   teaches you to recognize unhealthy feelings, thoughts, and behaviors, and replace them with positive thoughts  and actions. ? Interpersonal therapy (IPT). This helps you to improve the way you relate to and communicate with others. ? Family therapy. This treatment includes members of your family.  Medicine to treat anxiety and depression, or to help you control certain emotions and behaviors.  Lifestyle changes, such as: ? Limiting alcohol and drug use. ? Exercising regularly. ? Getting plenty of sleep. ? Making healthy eating choices. ? Spending more time outdoors.  Treatments involving stimulation of the brain can be used in situations with extremely severe symptoms, or when medicine or other therapies do not work over time. These treatments include electroconvulsive therapy, transcranial magnetic stimulation, and vagal nerve stimulation. Follow these instructions at home: Activity  Return to your normal activities as told by your health care provider.  Exercise regularly and spend time outdoors as told by your health care provider. General instructions  Take over-the-counter and prescription medicines only as told by your health care provider.  Do not drink alcohol. If you drink alcohol, limit your alcohol intake to no more than 1 drink a day for nonpregnant women and 2 drinks a day for men. One drink equals 12 oz of beer, 5 oz of wine, or 1 oz of hard liquor. Alcohol can affect any antidepressant medicines you are taking. Talk to your health care provider about your alcohol use.  Eat a healthy diet and get plenty of sleep.  Find activities that you enjoy doing, and make time to do them.  Consider joining a support group. Your health care provider may be able to recommend a support group.  Keep all follow-up visits as told by your health care provider. This is important. Where to find more information National Alliance on Mental Illness  www.nami.org [place] National Institute of Mental Health  www.nimh.nih.gov National Suicide Prevention Lifeline  1-800-273-TALK (8255). This is  free, 24-hour help. Contact a health care provider if:  Your symptoms get worse.  You develop new symptoms. Get help right away if:  You self-harm.  You have serious thoughts about hurting yourself or others.  You see, hear, taste, smell, or feel things that are not present (hallucinate). This information is not intended to replace advice given to you by your health care provider. Make sure you discuss any questions you have with your health care provider. Document Revised: 04/12/2017 Document Reviewed: 11/09/2015 Elsevier Patient Education  2020 Elsevier Inc.  

## 2019-07-29 MED ORDER — SPIRONOLACTONE 100 MG PO TABS: 50.0000 mg | ORAL_TABLET | Freq: Every day | ORAL | 0 refills | Status: DC

## 2019-07-29 MED ORDER — POTASSIUM CITRATE ER 15 MEQ (1620 MG) PO TBCR: 1.0000 | EXTENDED_RELEASE_TABLET | Freq: Three times a day (TID) | ORAL | 1 refills | Status: DC

## 2019-07-30 ENCOUNTER — Encounter: Payer: Self-pay | Admitting: Internal Medicine

## 2019-08-08 ENCOUNTER — Ambulatory Visit: Payer: Medicare Other | Attending: Internal Medicine

## 2019-08-08 DIAGNOSIS — Z23 Encounter for immunization: Secondary | ICD-10-CM

## 2019-08-08 NOTE — Progress Notes (Signed)
   Covid-19 Vaccination Clinic  Name:  April May    MRN: [id number] DOB: 03-Feb-1956  08/08/2019  [name] was observed post Covid-19 immunization for 15 minutes without incident. She was provided with Vaccine Information Sheet and instruction to access the V-Safe system.   [name] was instructed to call 911 with any severe reactions post vaccine: Marland Kitchen Difficulty breathing  . Swelling of face and throat  . A fast heartbeat  . A bad rash all over body  . Dizziness and weakness   Immunizations Administered    Name Date Dose VIS Date Route   Pfizer COVID-19 Vaccine 08/08/2019 11:34 AM 0.3 mL 04/24/2019 Intramuscular   Manufacturer: [place]   Lot: [id number]   [place]: [id number]

## 2019-08-10 ENCOUNTER — Ambulatory Visit (INDEPENDENT_AMBULATORY_CARE_PROVIDER_SITE_OTHER): Payer: Medicare Other | Admitting: *Deleted

## 2019-08-10 ENCOUNTER — Other Ambulatory Visit: Payer: Self-pay

## 2019-08-10 DIAGNOSIS — E538 Deficiency of other specified B group vitamins: Secondary | ICD-10-CM

## 2019-08-10 MED ORDER — CYANOCOBALAMIN 1000 MCG/ML IJ SOLN
1000.0000 ug | Freq: Once | INTRAMUSCULAR | Status: AC
  Administered 2019-08-10: 1000 ug via INTRAMUSCULAR

## 2019-08-10 NOTE — Progress Notes (Addendum)
I have reviewed and agree.

## 2019-08-10 NOTE — Progress Notes (Signed)
I have reviewed and agree.

## 2019-08-17 ENCOUNTER — Other Ambulatory Visit: Payer: Self-pay

## 2019-08-17 MED ORDER — BUDESONIDE 3 MG PO CPEP: 9.0000 mg | ORAL_CAPSULE | Freq: Every day | ORAL | 11 refills | Status: DC

## 2019-08-25 ENCOUNTER — Other Ambulatory Visit: Payer: Self-pay | Admitting: Internal Medicine

## 2019-08-25 ENCOUNTER — Encounter: Payer: Self-pay | Admitting: Internal Medicine

## 2019-08-25 DIAGNOSIS — N3281 Overactive bladder: Secondary | ICD-10-CM | POA: Insufficient documentation

## 2019-08-25 MED ORDER — MIRABEGRON ER 25 MG PO TB24: 25.0000 mg | ORAL_TABLET | Freq: Every day | ORAL | 0 refills | Status: DC

## 2019-09-01 ENCOUNTER — Other Ambulatory Visit: Payer: Self-pay | Admitting: Internal Medicine

## 2019-09-01 DIAGNOSIS — F332 Major depressive disorder, recurrent severe without psychotic features: Secondary | ICD-10-CM

## 2019-09-10 ENCOUNTER — Other Ambulatory Visit: Payer: Self-pay

## 2019-09-10 ENCOUNTER — Ambulatory Visit (INDEPENDENT_AMBULATORY_CARE_PROVIDER_SITE_OTHER): Payer: Medicare Other | Admitting: *Deleted

## 2019-09-10 DIAGNOSIS — E538 Deficiency of other specified B group vitamins: Secondary | ICD-10-CM | POA: Diagnosis not present

## 2019-09-10 MED ORDER — CYANOCOBALAMIN 1000 MCG/ML IJ SOLN
1000.0000 ug | Freq: Once | INTRAMUSCULAR | Status: AC
  Administered 2019-09-10: 1000 ug via INTRAMUSCULAR

## 2019-09-10 NOTE — Progress Notes (Signed)
Pls cosign for B12 inj../lmb  

## 2019-09-10 NOTE — Progress Notes (Signed)
I have reviewed and agree.

## 2019-09-17 ENCOUNTER — Other Ambulatory Visit: Payer: Self-pay | Admitting: Internal Medicine

## 2019-09-17 DIAGNOSIS — I10 Essential (primary) hypertension: Secondary | ICD-10-CM

## 2019-09-17 DIAGNOSIS — F321 Major depressive disorder, single episode, moderate: Secondary | ICD-10-CM

## 2019-09-22 ENCOUNTER — Other Ambulatory Visit: Payer: Self-pay | Admitting: Internal Medicine

## 2019-09-22 DIAGNOSIS — Z1231 Encounter for screening mammogram for malignant neoplasm of breast: Secondary | ICD-10-CM

## 2019-10-09 ENCOUNTER — Ambulatory Visit (INDEPENDENT_AMBULATORY_CARE_PROVIDER_SITE_OTHER): Payer: Medicare Other | Admitting: *Deleted

## 2019-10-09 ENCOUNTER — Other Ambulatory Visit: Payer: Self-pay

## 2019-10-09 DIAGNOSIS — E538 Deficiency of other specified B group vitamins: Secondary | ICD-10-CM

## 2019-10-09 MED ORDER — CYANOCOBALAMIN 1000 MCG/ML IJ SOLN
1000.0000 ug | Freq: Once | INTRAMUSCULAR | Status: AC
  Administered 2019-10-09: 1000 ug via INTRAMUSCULAR

## 2019-10-09 NOTE — Progress Notes (Signed)
I have reviewed and agree.

## 2019-10-09 NOTE — Progress Notes (Signed)
Ols cosign for B12 since PCP is out of the office today.Marland KitchenJohny Chess

## 2019-10-27 ENCOUNTER — Other Ambulatory Visit: Payer: Self-pay | Admitting: Internal Medicine

## 2019-10-27 DIAGNOSIS — F332 Major depressive disorder, recurrent severe without psychotic features: Secondary | ICD-10-CM

## 2019-10-27 MED ORDER — DULOXETINE HCL 30 MG PO CPEP: ORAL_CAPSULE | ORAL | 1 refills | Status: DC

## 2019-10-28 ENCOUNTER — Other Ambulatory Visit: Payer: Self-pay

## 2019-10-28 ENCOUNTER — Encounter: Payer: Self-pay | Admitting: Internal Medicine

## 2019-10-28 ENCOUNTER — Ambulatory Visit (INDEPENDENT_AMBULATORY_CARE_PROVIDER_SITE_OTHER): Payer: Medicare Other | Admitting: Internal Medicine

## 2019-10-28 VITALS — BP 160/84 | HR 69 | Temp 97.4°F | Resp 16 | Ht 60.0 in | Wt 240.0 lb

## 2019-10-28 DIAGNOSIS — I10 Essential (primary) hypertension: Secondary | ICD-10-CM

## 2019-10-28 DIAGNOSIS — E876 Hypokalemia: Secondary | ICD-10-CM | POA: Diagnosis not present

## 2019-10-28 DIAGNOSIS — R7303 Prediabetes: Secondary | ICD-10-CM

## 2019-10-28 DIAGNOSIS — G4733 Obstructive sleep apnea (adult) (pediatric): Secondary | ICD-10-CM | POA: Diagnosis not present

## 2019-10-28 DIAGNOSIS — F09 Unspecified mental disorder due to known physiological condition: Secondary | ICD-10-CM

## 2019-10-28 DIAGNOSIS — F068 Other specified mental disorders due to known physiological condition: Secondary | ICD-10-CM

## 2019-10-28 DIAGNOSIS — T502X5A Adverse effect of carbonic-anhydrase inhibitors, benzothiadiazides and other diuretics, initial encounter: Secondary | ICD-10-CM

## 2019-10-28 DIAGNOSIS — F329 Major depressive disorder, single episode, unspecified: Secondary | ICD-10-CM

## 2019-10-28 LAB — BASIC METABOLIC PANEL
BUN: 8 mg/dL (ref 6–23)
CO2: 35 mEq/L — ABNORMAL HIGH (ref 19–32)
Calcium: 10 mg/dL (ref 8.4–10.5)
Chloride: 101 mEq/L (ref 96–112)
Creatinine, Ser: 0.82 mg/dL (ref 0.40–1.20)
GFR: 84.92 mL/min (ref >60.00)
Glucose, Bld: 102 mg/dL — ABNORMAL HIGH (ref 70–99)
Potassium: 2.9 mEq/L — ABNORMAL LOW (ref 3.5–5.1)
Sodium: 143 mEq/L (ref 135–145)

## 2019-10-28 MED ORDER — BUPROPION HCL ER (SR) 150 MG PO TB12: 150.0000 mg | ORAL_TABLET | Freq: Two times a day (BID) | ORAL | 0 refills | Status: DC

## 2019-10-28 MED ORDER — DULOXETINE HCL 60 MG PO CPEP: 60.0000 mg | ORAL_CAPSULE | Freq: Every day | ORAL | 0 refills | Status: DC

## 2019-10-28 NOTE — Patient Instructions (Signed)

## 2019-10-28 NOTE — Progress Notes (Signed)
Subjective:  Patient ID: April May, female    DOB: 06-26-1955  Age: 64 y.o. MRN: [id number]  CC: Hypertension  This visit occurred during the SARS-CoV-2 public health emergency.  Safety protocols were in place, including screening questions prior to the visit, additional usage of staff PPE, and extensive cleaning of exam room while observing appropriate contact time as indicated for disinfecting solutions.    HPI [name] presents for f/up - She complains of fatigue and daytime sleepiness.  She has sleep apnea that is not currently being treated.  She wants to increase the dose of her antidepressants.  She is concerned her blood pressure is not been well controlled.  She is not been very successful with lifestyle modifications.  She denies headache, blurred vision, chest pain, shortness of breath, edema, or fatigue.  She says her kidney doctor currently has her on 50 mg of spironolactone each day.  Outpatient Medications Prior to Visit  Medication Sig Dispense Refill  . amLODipine (NORVASC) 10 MG tablet Take 1 tablet (10 mg total) by mouth daily. 90 tablet 1  . budesonide (ENTOCORT EC) 3 MG 24 hr capsule Take 3 capsules (9 mg total) by mouth daily. 90 capsule 11  . BYSTOLIC 10 MG tablet TAKE 1 TABLET BY MOUTH  DAILY 90 tablet 0  . Cholecalciferol (VITAMIN D3) 1.25 MG (50000 UT) CAPS Take 1 capsule by mouth once a week. 12 capsule 3  . clonazePAM (KLONOPIN) 0.5 MG tablet Take 1 tablet (0.5 mg total) by mouth 2 (two) times daily as needed for anxiety. 60 tablet 2  . [employer] PAIN RELIEF EXTRA STRENGTH 500 MG tablet TAKE 1 TABLET BY MOUTH EVERY 8 HOURS AS NEEDED FOR MODERATE PAIN 60 tablet 5  . fluticasone (FLONASE) 50 MCG/ACT nasal spray USE 2 SPRAYS INTO BOTH  NOSTRILS DAILY AS NEEDED  FOR ALLERGIES OR RHINITIS. 32 g 3  . lamoTRIgine (LAMICTAL) 200 MG tablet TAKE 1 TABLET BY MOUTH AT  BEDTIME 90 tablet 0  . mupirocin ointment (BACTROBAN) 2 % SMARTSIG:1 Application  Topical 2-3 Times Daily    . Potassium Citrate 15 MEQ (1620 MG) TBCR Take 1 tablet by mouth in the morning, at noon, and at bedtime. 270 tablet 1  . PROAIR HFA 108 (90 Base) MCG/ACT inhaler TAKE 2 PUFFS BY MOUTH EVERY 6 HOURS AS NEEDED FOR WHEEZE OR SHORTNESS OF BREATH 18 g 5  . buPROPion (WELLBUTRIN XL) 150 MG 24 hr tablet Take 1 tablet (150 mg total) by mouth daily. 90 tablet 1  . DULoxetine (CYMBALTA) 30 MG capsule TAKE 1 CAPSULE BY MOUTH EVERY DAY 90 capsule 1  . furosemide (LASIX) 40 MG tablet Take 40 mg by mouth every other day.     . mirabegron ER (MYRBETRIQ) 25 MG TB24 tablet Take 1 tablet (25 mg total) by mouth daily. 90 tablet 0  . spironolactone (ALDACTONE) 100 MG tablet Take 0.5 tablets (50 mg total) by mouth daily. 90 tablet 0  . tobramycin (TOBREX) 0.3 % ophthalmic solution     . buPROPion (WELLBUTRIN SR) 100 MG 12 hr tablet      No facility-administered medications prior to visit.    ROS Review of Systems  Constitutional: Positive for fatigue. Negative for chills, diaphoresis and unexpected weight change.  HENT: Negative.   Eyes: Negative for visual disturbance.  Respiratory: Positive for apnea. Negative for cough, shortness of breath and wheezing.   Cardiovascular: Negative for chest pain, palpitations and leg swelling.  Gastrointestinal: Negative for abdominal  pain, constipation, diarrhea, nausea and vomiting.  Endocrine: Negative.   Genitourinary: Negative.  Negative for difficulty urinating.  Musculoskeletal: Negative for arthralgias, back pain and myalgias.  Skin: Negative.  Negative for color change, pallor and rash.  Neurological: Negative for dizziness, weakness, light-headedness, numbness and headaches.  Hematological: Negative for adenopathy. Does not bruise/bleed easily.  Psychiatric/Behavioral: Positive for dysphoric mood and sleep disturbance. Negative for confusion, decreased concentration and suicidal ideas. The patient is not nervous/anxious.      Objective:  BP (!) 160/84 (BP Location: Left Arm, Patient Position: Sitting, Cuff Size: Large)   Pulse 69   Temp (!) 97.4 F (36.3 C) (Oral)   Resp 16   Ht 5' (1.524 m)   Wt 240 lb (108.9 kg)   LMP 05/14/1988   SpO2 94%   BMI 46.87 kg/m   BP Readings from Last 3 Encounters:  10/28/19 (!) 160/84  07/28/19 (!) 142/78  07/27/19 130/78    Wt Readings from Last 3 Encounters:  10/28/19 240 lb (108.9 kg)  07/28/19 246 lb (111.6 kg)  07/27/19 238 lb (108 kg)    Physical Exam Vitals reviewed.  Constitutional:      Appearance: She is obese.  HENT:     Nose: Nose normal.     Mouth/Throat:     Mouth: Mucous membranes are moist.  Eyes:     General: No scleral icterus.    Conjunctiva/sclera: Conjunctivae normal.  Cardiovascular:     Rate and Rhythm: Normal rate and regular rhythm.     Heart sounds: No murmur heard.   Pulmonary:     Effort: Pulmonary effort is normal.     Breath sounds: No stridor. No wheezing, rhonchi or rales.  Abdominal:     General: Abdomen is protuberant. Bowel sounds are normal. There is no distension.     Palpations: Abdomen is soft. There is no hepatomegaly, splenomegaly or mass.     Tenderness: There is no abdominal tenderness.  Musculoskeletal:        General: Normal range of motion.     Cervical back: Neck supple.     Right lower leg: No edema.     Left lower leg: No edema.  Lymphadenopathy:     Cervical: No cervical adenopathy.  Skin:    General: Skin is warm and dry.     Coloration: Skin is not pale.  Neurological:     General: No focal deficit present.     Mental Status: She is alert.  Psychiatric:        Mood and Affect: Mood normal.        Behavior: Behavior normal.     Lab Results  Component Value Date   WBC 15.0 (H) 07/28/2019   HGB 15.0 07/28/2019   HCT 45.1 07/28/2019   PLT 273.0 07/28/2019   GLUCOSE 102 (H) 10/28/2019   CHOL 116 04/08/2019   TRIG 88.0 04/08/2019   HDL 44.70 04/08/2019   LDLCALC 53 04/08/2019    ALT 25 10/10/2018   AST 17 10/10/2018   NA 143 10/28/2019   K 2.9 (L) 10/28/2019   CL 101 10/28/2019   CREATININE 0.82 10/28/2019   BUN 8 10/28/2019   CO2 35 (H) 10/28/2019   TSH 1.16 04/08/2019   HGBA1C 6.1 10/28/2019   MICROALBUR <0.7 06/10/2019    MM 3D SCREEN BREAST BILATERAL  Result Date: 10/23/2018 CLINICAL DATA:  Screening. EXAM: DIGITAL SCREENING BILATERAL MAMMOGRAM WITH TOMO AND CAD COMPARISON:  Previous exam(s). ACR Breast Density Category b: There  are scattered areas of fibroglandular density. FINDINGS: There are no findings suspicious for malignancy. Images were processed with CAD. IMPRESSION: No mammographic evidence of malignancy. A result letter of this screening mammogram will be mailed directly to the patient. RECOMMENDATION: Screening mammogram in one year. (Code:SM-B-01Y) BI-RADS CATEGORY  1: Negative. Electronically Signed   By: Abelardo Diesel M.D.   On: 10/23/2018 10:39    Assessment & Plan:   Lun was seen today for hypertension.  Diagnoses and all orders for this visit:  Malignant hypertension- Her blood pressure is not well controlled and her potassium level is down to 2.9.  I have asked her to increase the dose of spironolactone and to add an ARB. -     Basic metabolic panel; Future -     Basic metabolic panel -     spironolactone (ALDACTONE) 100 MG tablet; Take 1 tablet (100 mg total) by mouth daily. -     olmesartan (BENICAR) 20 MG tablet; Take 1 tablet (20 mg total) by mouth daily.  Diuretic-induced hypokalemia- See above. -     Basic metabolic panel; Future -     Basic metabolic panel -     spironolactone (ALDACTONE) 100 MG tablet; Take 1 tablet (100 mg total) by mouth daily.  Prediabetes- Her A1c is at 6.1%.  She does not currently need to be treated with a glycemic agent. -     Hemoglobin A1c; Future -     Hemoglobin A1c  Obstructive sleep apnea syndrome -     Ambulatory referral to Sleep Studies  Cognitive dysfunction associated with  depression -     DULoxetine (CYMBALTA) 60 MG capsule; Take 1 capsule (60 mg total) by mouth daily. -     buPROPion (WELLBUTRIN SR) 150 MG 12 hr tablet; Take 1 tablet (150 mg total) by mouth 2 (two) times daily.   I have discontinued Shasha W. Tomey's furosemide, tobramycin, buPROPion, mirabegron ER, DULoxetine, and buPROPion. I have also changed her spironolactone. Additionally, I am having her start on DULoxetine, buPROPion, and olmesartan. Lastly, I am having her maintain her fluticasone, [employer]S Pain Relief Extra Strength, clonazePAM, ProAir HFA, Vitamin D3, mupirocin ointment, amLODipine, Potassium Citrate, budesonide, Bystolic, and lamoTRIgine.  Meds ordered this encounter  Medications  . DULoxetine (CYMBALTA) 60 MG capsule    Sig: Take 1 capsule (60 mg total) by mouth daily.    Dispense:  90 capsule    Refill:  0  . buPROPion (WELLBUTRIN SR) 150 MG 12 hr tablet    Sig: Take 1 tablet (150 mg total) by mouth 2 (two) times daily.    Dispense:  180 tablet    Refill:  0  . spironolactone (ALDACTONE) 100 MG tablet    Sig: Take 1 tablet (100 mg total) by mouth daily.    Dispense:  90 tablet    Refill:  0  . olmesartan (BENICAR) 20 MG tablet    Sig: Take 1 tablet (20 mg total) by mouth daily.    Dispense:  90 tablet    Refill:  0     Follow-up: Return in about 3 months (around 01/28/2020).  Scarlette Calico, MD

## 2019-10-29 ENCOUNTER — Ambulatory Visit
Admission: RE | Admit: 2019-10-29 | Discharge: 2019-10-29 | Disposition: A | Discharge status: Home / Self Care | Payer: Medicare Other | Source: Ambulatory Visit | Attending: Internal Medicine | Admitting: Internal Medicine

## 2019-10-29 DIAGNOSIS — Z1231 Encounter for screening mammogram for malignant neoplasm of breast: Secondary | ICD-10-CM

## 2019-10-29 LAB — HEMOGLOBIN A1C: Hgb A1c MFr Bld: 6.1 % (ref 4.6–6.5)

## 2019-10-29 MED ORDER — SPIRONOLACTONE 100 MG PO TABS: 100.0000 mg | ORAL_TABLET | Freq: Every day | ORAL | 0 refills | Status: DC

## 2019-10-29 MED ORDER — OLMESARTAN MEDOXOMIL 20 MG PO TABS: 20.0000 mg | ORAL_TABLET | Freq: Every day | ORAL | 0 refills | Status: DC

## 2019-10-30 LAB — HM MAMMOGRAPHY

## 2019-11-02 ENCOUNTER — Other Ambulatory Visit: Payer: Self-pay | Admitting: Internal Medicine

## 2019-11-02 DIAGNOSIS — N3281 Overactive bladder: Secondary | ICD-10-CM

## 2019-11-09 ENCOUNTER — Other Ambulatory Visit: Payer: Self-pay

## 2019-11-09 ENCOUNTER — Ambulatory Visit (INDEPENDENT_AMBULATORY_CARE_PROVIDER_SITE_OTHER): Payer: Medicare Other | Admitting: *Deleted

## 2019-11-09 DIAGNOSIS — E538 Deficiency of other specified B group vitamins: Secondary | ICD-10-CM | POA: Diagnosis not present

## 2019-11-09 MED ORDER — CYANOCOBALAMIN 1000 MCG/ML IJ SOLN
1000.0000 ug | Freq: Once | INTRAMUSCULAR | Status: AC
Start: 2019-11-09 — End: 2019-11-09
  Administered 2019-11-09: 1000 ug via INTRAMUSCULAR

## 2019-11-09 NOTE — Progress Notes (Signed)
Pls cosign for B12 inj../lmb  

## 2019-11-12 ENCOUNTER — Other Ambulatory Visit: Payer: Self-pay | Admitting: Internal Medicine

## 2019-11-12 DIAGNOSIS — I10 Essential (primary) hypertension: Secondary | ICD-10-CM

## 2019-11-12 MED ORDER — OLMESARTAN MEDOXOMIL 20 MG PO TABS: 20.0000 mg | ORAL_TABLET | Freq: Every day | ORAL | 0 refills | Status: DC

## 2019-11-25 ENCOUNTER — Institutional Professional Consult (permissible substitution): Payer: Self-pay | Admitting: Neurology

## 2019-12-02 ENCOUNTER — Other Ambulatory Visit: Payer: Self-pay | Admitting: Internal Medicine

## 2019-12-02 DIAGNOSIS — I10 Essential (primary) hypertension: Secondary | ICD-10-CM

## 2019-12-02 DIAGNOSIS — N3281 Overactive bladder: Secondary | ICD-10-CM

## 2019-12-02 DIAGNOSIS — F321 Major depressive disorder, single episode, moderate: Secondary | ICD-10-CM

## 2019-12-02 DIAGNOSIS — F09 Unspecified mental disorder due to known physiological condition: Secondary | ICD-10-CM

## 2019-12-09 ENCOUNTER — Ambulatory Visit (INDEPENDENT_AMBULATORY_CARE_PROVIDER_SITE_OTHER): Payer: Medicare Other | Admitting: *Deleted

## 2019-12-09 ENCOUNTER — Other Ambulatory Visit: Payer: Self-pay

## 2019-12-09 DIAGNOSIS — E538 Deficiency of other specified B group vitamins: Secondary | ICD-10-CM

## 2019-12-09 MED ORDER — CYANOCOBALAMIN 1000 MCG/ML IJ SOLN
1000.0000 ug | Freq: Once | INTRAMUSCULAR | Status: AC
  Administered 2019-12-09: 1000 ug via INTRAMUSCULAR

## 2019-12-09 NOTE — Progress Notes (Signed)
Pls cosign for B12 inj../lmb  

## 2019-12-16 ENCOUNTER — Other Ambulatory Visit: Payer: Self-pay | Admitting: Internal Medicine

## 2019-12-16 ENCOUNTER — Encounter: Payer: Self-pay | Admitting: Internal Medicine

## 2019-12-16 DIAGNOSIS — J0101 Acute recurrent maxillary sinusitis: Secondary | ICD-10-CM

## 2019-12-16 MED ORDER — AMOXICILLIN-POT CLAVULANATE 875-125 MG PO TABS: 1.0000 | ORAL_TABLET | Freq: Two times a day (BID) | ORAL | 0 refills | Status: DC

## 2019-12-22 ENCOUNTER — Encounter: Payer: Self-pay | Admitting: Internal Medicine

## 2019-12-22 ENCOUNTER — Other Ambulatory Visit: Payer: Self-pay | Admitting: Internal Medicine

## 2019-12-22 DIAGNOSIS — J302 Other seasonal allergic rhinitis: Secondary | ICD-10-CM

## 2019-12-22 MED ORDER — FLUTICASONE PROPIONATE 50 MCG/ACT NA SUSP: NASAL | 3 refills | Status: DC

## 2019-12-23 MED ORDER — FLUTICASONE PROPIONATE 50 MCG/ACT NA SUSP: 2.0000 | Freq: Every day | NASAL | 3 refills | Status: DC

## 2019-12-24 DIAGNOSIS — D225 Melanocytic nevi of trunk: Secondary | ICD-10-CM | POA: Diagnosis not present

## 2019-12-24 DIAGNOSIS — Z1283 Encounter for screening for malignant neoplasm of skin: Secondary | ICD-10-CM | POA: Diagnosis not present

## 2019-12-29 ENCOUNTER — Encounter: Payer: Self-pay | Admitting: Neurology

## 2019-12-29 ENCOUNTER — Ambulatory Visit: Payer: Medicare Other | Admitting: Neurology

## 2019-12-29 VITALS — BP 128/74 | HR 65 | Ht 60.0 in | Wt 239.0 lb

## 2019-12-29 DIAGNOSIS — J449 Chronic obstructive pulmonary disease, unspecified: Secondary | ICD-10-CM

## 2019-12-29 DIAGNOSIS — Z820 Family history of epilepsy and other diseases of the nervous system: Secondary | ICD-10-CM | POA: Diagnosis not present

## 2019-12-29 DIAGNOSIS — R351 Nocturia: Secondary | ICD-10-CM

## 2019-12-29 DIAGNOSIS — G4719 Other hypersomnia: Secondary | ICD-10-CM

## 2019-12-29 DIAGNOSIS — G4733 Obstructive sleep apnea (adult) (pediatric): Secondary | ICD-10-CM

## 2019-12-29 DIAGNOSIS — Z6841 Body Mass Index (BMI) 40.0 and over, adult: Secondary | ICD-10-CM

## 2019-12-29 NOTE — Progress Notes (Signed)
Subjective:    Patient ID: April May is a 64 y.o. female.  HPI     Star Age, MD, PhD [facility] [address]O. [place], [place] [address]  Dear Dr. Ronnald Ramp,   I saw your patient, April May, upon your kind request, in my Sleep clinic today for initial consultation of her sleep disorder, in particular, evaluation of her prior diagnosis of obstructive sleep apnea.  The patient is unaccompanied today.  As you know, April May is a 64 year old right-handed woman with an underlying medical history of hypertension, diabetes, depression, allergies, COPD, Crohn's disease, vitamin D deficiency, squamous cell carcinoma, history of small bowel obstruction, and morbid obesity with a BMI of over 88, who was previously diagnosed with obstructive sleep apnea.  She is currently not on CPAP therapy.  She had a split-night sleep study on 07/23/2011 and I was able to review the results.  Her total AHI was 20.3/h at the time.  She reports that she used CPAP therapy for about a year via full facemask.  She found it cumbersome to use and also cumbersome to clean.  She had a upper respiratory infection and after that she stopped using her machine.  She is willing to get retested and consider CPAP therapy again but would prefer a device that is easy to clean and the mask that is easy to maintain cleanliness.  She has had some weight fluctuation.  She reports lack of energy.  She does take naps during the day and does not keep a very set schedule for her sleep.  She does not work, she is on disability.  She has a sister with sleep apnea.  She has nocturia about once per average night.  She is trying to quit smoking, she quit for about a week in the past but went back on it.  She smokes about 1 pack/day.  She is divorced, lives alone, has 2 grown children and 3 grandchildren.  Her Epworth sleepiness score is 9 out of 24, fatigue severity score is 61 out of 63.   She does not drink caffeine daily, does not drink alcohol.  Her Past Medical History Is Significant For: Past Medical History:  Diagnosis Date  . Allergy   . Anxiety   . Benign breast lumps   . COPD (chronic obstructive pulmonary disease) ([place])   . Crohn disease ([place])   . Depression   . Diabetes mellitus type II    borderline, md checks hemaglobin A 1 c at md office   . Hypertension   . Sleep apnea    does not use cpap since weight loss 1 and 1/2 years ago  . Small bowel obstruction ([place]) 2016  . 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 Vitamin D deficiency     Her Past Surgical History Is Significant For: Past Surgical History:  Procedure Laterality Date  . ABDOMINAL HYSTERECTOMY     1 ovary removed  . APPENDECTOMY    . APPLICATION OF WOUND VAC  11/24/2014   Procedure: APPLICATION OF WOUND VAC;  Surgeon: Donnie Mesa, MD;  Location: [facility];  Service: General;;  . BOWEL RESECTION N/A 11/24/2014   Procedure: SMALL BOWEL RESECTION;  Surgeon: Donnie Mesa, MD;  Location: [facility];  Service: General;  Laterality: N/A;  . BREAST EXCISIONAL BIOPSY Right 1972  . BREAST SURGERY Right 40 yrs ago   benign tumor removed  . LAPAROTOMY N/A 11/24/2014   Procedure: EXPLORATORY LAPAROTOMY EXTENSIVE  LYSIS OF ADHESIONS SAMLL BOWEL RESECTION RIGHT HEMI COLECTOMY WOUND VAC APPLICATION. ;  Surgeon: Donnie Mesa, MD;  Location: [facility];  Service: General;  Laterality: N/A;  . LYSIS OF ADHESION  11/24/2014   Procedure: LYSIS OF ADHESION (3 HRS);  Surgeon: Donnie Mesa, MD;  Location: [facility];  Service: General;;  . PARTIAL COLECTOMY  11/24/2014   Procedure: RIGHT HEMI COLECTOMY;  Surgeon: Donnie Mesa, MD;  Location: [facility];  Service: General;;  . SMALL INTESTINE SURGERY  age 48    Her Family History Is Significant For: Family History  Problem Relation Age of Onset  . Hypertension Mother   . Ulcerative colitis Mother   . Crohn's disease Mother   . Heart disease Mother   . Kidney  disease Mother   . Arthritis Father   . Brain cancer Father   . Diabetes Father   . Alcohol abuse Paternal Uncle   . Diabetes Paternal Grandmother   . Stroke Paternal Grandmother   . Heart failure Sister   . Kidney disease Sister   . Heart failure Brother   . Other Sister        prediabetes  . Kidney cancer Brother     Her Social History Is Significant For: Social History   Socioeconomic History  . Marital status: Divorced    Spouse name: Not on file  . Number of children: 2  . Years of education: Not on file  . Highest education level: 12th grade  Occupational History  . Occupation: disabled  Tobacco Use  . Smoking status: Current Every Day Smoker    Packs/day: 1.00    Years: 35.00    Pack years: 35.00    Types: Cigarettes  . Smokeless tobacco: Never Used  . Tobacco comment: gi info given  Vaping Use  . Vaping Use: Never used  Substance and Sexual Activity  . Alcohol use: No  . Drug use: No  . Sexual activity: Not Currently  Other Topics Concern  . Not on file  Social History Narrative  . Not on file   Social Determinants of Health   Financial Resource Strain:   . Difficulty of Paying Living Expenses:   Food Insecurity:   . Worried About Charity fundraiser in the Last Year:   . Arboriculturist in the Last Year:   Transportation Needs:   . Film/video editor (Medical):   Marland Kitchen Lack of Transportation (Non-Medical):   Physical Activity:   . Days of Exercise per Week:   . Minutes of Exercise per Session:   Stress:   . Feeling of Stress :   Social Connections:   . Frequency of Communication with Friends and Family:   . Frequency of Social Gatherings with Friends and Family:   . Attends Religious Services:   . Active Member of Clubs or Organizations:   . Attends Archivist Meetings:   Marland Kitchen Marital Status:     Her Allergies Are:  Allergies  Allergen Reactions  . Azathioprine Other (See Comments)    Side effects to pancrease     . Crestor  [Rosuvastatin Calcium] Other (See Comments)    Muscle and joint aches  . Lipitor [Atorvastatin Calcium] Other (See Comments)    Muscle cramps  . Lisinopril Cough  :   Her Current Medications Are:  Outpatient Encounter Medications as of 12/29/2019  Medication Sig  . amLODipine (NORVASC) 10 MG tablet TAKE 1 TABLET BY MOUTH  DAILY  . amoxicillin-clavulanate (AUGMENTIN) 875-125  MG tablet Take 1 tablet by mouth 2 (two) times daily.  . budesonide (ENTOCORT EC) 3 MG 24 hr capsule Take 3 capsules (9 mg total) by mouth daily.  Marland Kitchen buPROPion (WELLBUTRIN SR) 150 MG 12 hr tablet TAKE 1 TABLET BY MOUTH  TWICE DAILY  . BYSTOLIC 10 MG tablet TAKE 1 TABLET BY MOUTH  DAILY  . Cholecalciferol (VITAMIN D3) 1.25 MG (50000 UT) CAPS Take 1 capsule by mouth once a week.  . clonazePAM (KLONOPIN) 0.5 MG tablet Take 1 tablet (0.5 mg total) by mouth 2 (two) times daily as needed for anxiety.  . [employer]S PAIN RELIEF EXTRA STRENGTH 500 MG tablet TAKE 1 TABLET BY MOUTH EVERY 8 HOURS AS NEEDED FOR MODERATE PAIN  . DULoxetine (CYMBALTA) 60 MG capsule TAKE 1 CAPSULE BY MOUTH  DAILY  . fluticasone (FLONASE) 50 MCG/ACT nasal spray Place 2 sprays into both nostrils daily.  Marland Kitchen lamoTRIgine (LAMICTAL) 200 MG tablet TAKE 1 TABLET BY MOUTH AT  BEDTIME  . mupirocin ointment (BACTROBAN) 2 % SMARTSIG:1 Application Topical 2-3 Times Daily  . MYRBETRIQ 25 MG TB24 tablet TAKE 1 TABLET BY MOUTH  DAILY  . olmesartan (BENICAR) 20 MG tablet Take 1 tablet (20 mg total) by mouth daily.  . Potassium Citrate 15 MEQ (1620 MG) TBCR Take 1 tablet by mouth in the morning, at noon, and at bedtime.  Marland Kitchen PROAIR HFA 108 (90 Base) MCG/ACT inhaler TAKE 2 PUFFS BY MOUTH EVERY 6 HOURS AS NEEDED FOR WHEEZE OR SHORTNESS OF BREATH  . spironolactone (ALDACTONE) 100 MG tablet Take 1 tablet (100 mg total) by mouth daily.  . [DISCONTINUED] amLODipine (NORVASC) 5 MG tablet Take 10 mg by mouth daily.   No facility-administered encounter medications on file as of 12/29/2019.   :  Review of Systems:  Out of a complete 14 point review of systems, all are reviewed and negative with the exception of these symptoms as listed below: Review of Systems  Neurological:       Here for sleep consult. Prior sleep study in 2013 started cpap and used for 1 year. Pt sts she had a severe upper respiratory infection and then stopped using the machine.  Epworth Sleepiness Scale 0= would never doze 1= slight chance of dozing 2= moderate chance of dozing 3= high chance of dozing  Sitting and reading:1 Watching TV:1 Sitting inactive in a public place (ex. Theater or meeting):0 As a passenger in a car for an hour without a break:1 Lying down to rest in the afternoon:3 Sitting and talking to someone:1 Sitting quietly after lunch (no alcohol):2 In a car, while stopped in traffic:0 Total:9     Objective:  Neurological Exam  Physical Exam Physical Examination:   Vitals:   12/29/19 1118  BP: 128/74  Pulse: 65  SpO2: 95%    General Examination: The patient is a very pleasant 64 y.o. female in no acute distress. She appears well-developed and well-nourished and well groomed.   HEENT: Normocephalic, atraumatic, pupils are equal, round and reactive to light, extraocular tracking is good without limitation to gaze excursion or nystagmus noted. Hearing is grossly intact. Face is symmetric with normal facial animation. Speech is clear with no dysarthria noted. There is no hypophonia. There is no lip, neck/head, jaw or voice tremor. Neck is supple with full range of passive and active motion. There are no carotid bruits on auscultation. Oropharynx exam reveals: mild mouth dryness, adequate dental hygiene with dentures in place, Mallampati class II, somewhat smaller airway, tonsils absent,  neck circumference of 15-7/8 inches.  Tongue protrudes centrally and palate elevates symmetrically.    Chest: Clear to auscultation without wheezing, rhonchi or crackles noted.  Heart:  S1+S2+0, regular and normal without murmurs, rubs or gallops noted.   Abdomen: Soft, non-tender and non-distended with normal bowel sounds appreciated on auscultation.  Extremities: There is no pitting edema in the distal lower extremities bilaterally.   Skin: Warm and dry without trophic changes noted.   Musculoskeletal: exam reveals no obvious joint deformities, tenderness or joint swelling or erythema.   Neurologically:  Mental status: The patient is awake, alert and oriented in all 4 spheres. Her immediate and remote memory, attention, language skills and fund of knowledge are appropriate. There is no evidence of aphasia, agnosia, apraxia or anomia. Speech is clear with normal prosody and enunciation. Thought process is linear. Mood is normal and affect is normal.  Cranial nerves II - XII are as described above under HEENT exam.  Motor exam: Normal bulk, strength and tone is noted. There is no tremor, Romberg is negative. Reflexes are 2+ throughout. Fine motor skills and coordination: grossly intact.  Cerebellar testing: No dysmetria or intention tremor. There is no truncal or gait ataxia.  Sensory exam: intact to light touch in the upper and lower extremities.  Gait, station and balance: She stands easily. No veering to one side is noted. No leaning to one side is noted. Posture is age-appropriate and stance is narrow based. Gait shows normal stride length and normal pace. No problems turning are noted. Tandem walk is unremarkable.                Assessment and Plan:  In summary, April May is a very pleasant 64 y.o.-year old female with an underlying medical history of hypertension, diabetes, depression, allergies, COPD, Crohn's disease, vitamin D deficiency, squamous cell carcinoma, history of small bowel obstruction, and morbid obesity with a BMI of over 67, who presents for evaluation of her sleep disorder, in particular her prior diagnosis of obstructive sleep apnea.  She  has not used CPAP therapy in several years, had developed an upper respiratory infection and stopped using her machine after about a year.  She would be willing to get retested.  She would be willing to consider CPAP therapy again.  I advised her that we would help her with education on how to clean the machine and how often to change the supplies.   I had a long chat with the patient about my findings and the diagnosis of OSA, its prognosis and treatment options. We talked about medical treatments, surgical interventions and non-pharmacological approaches. I explained in particular the risks and ramifications of untreated moderate to severe OSA, especially with respect to developing cardiovascular disease down the Road, including congestive heart failure, difficult to treat hypertension, cardiac arrhythmias, or stroke. Even type 2 diabetes has, in part, been linked to untreated OSA. Symptoms of untreated OSA include daytime sleepiness, memory problems, mood irritability and mood disorder such as depression and anxiety, lack of energy, as well as recurrent headaches, especially morning headaches. We talked about smoking cessation and trying to maintain a healthy lifestyle in general, as well as the importance of weight control. We also talked about the importance of good sleep hygiene. I recommended the following at this time: sleep study.   I explained the importance of being compliant with PAP treatment, not only for insurance purposes but primarily to improve Her symptoms, and for the patient's long term health benefit,  including to reduce Her cardiovascular risks. I answered all her questions today and the patient was in agreement. I plan to see her back after the sleep study is completed and encouraged her to call with any interim questions, concerns, problems or updates.   Thank you very much for allowing me to participate in the care of this nice patient. If I can be of any further assistance to you  please do not hesitate to call me at [phone].  Sincerely,   Star Age, MD, PhD

## 2019-12-29 NOTE — Patient Instructions (Signed)
Thank you for choosing [facility] for your sleep related care! It was nice to meet you today! I appreciate that you entrust me with your sleep related healthcare concerns. I hope, I was able to address at least some of your concerns today, and that I can help you feel reassured and also get better.    Here is what we discussed today and what we came up with as our plan for you:    Based on your symptoms and your exam I believe you are still at risk for obstructive sleep apnea and would benefit from reevaluation as it has been many years and you need new supplies and an updated machine. Therefore, I think we should proceed with a sleep study to determine how severe your sleep apnea is. If you have more than mild OSA, I want you to consider ongoing treatment with CPAP. Please remember, the risks and ramifications of moderate to severe obstructive sleep apnea or OSA are: Cardiovascular disease, including congestive heart failure, stroke, difficult to control hypertension, arrhythmias, and even type 2 diabetes has been linked to untreated OSA. Sleep apnea causes disruption of sleep and sleep deprivation in most cases, which, in turn, can cause recurrent headaches, problems with memory, mood, concentration, focus, and vigilance. Most people with untreated sleep apnea report excessive daytime sleepiness, which can affect their ability to drive. Please do not drive if you feel sleepy.   I will likely see you back after your sleep study to go over the test results and where to go from there. We will call you after your sleep study to advise about the results (most likely, you will hear from [place], my nurse) and to set up an appointment at the time, as necessary.    Our sleep lab administrative assistant will call you to schedule your sleep study. If you don't hear back from her by about 2 weeks from now, please feel free to call her at [phone]. You can leave a message with your phone  number and concerns, if you get the voicemail box. She will call back as soon as possible.

## 2019-12-30 ENCOUNTER — Other Ambulatory Visit: Payer: Self-pay | Admitting: Internal Medicine

## 2019-12-30 DIAGNOSIS — J449 Chronic obstructive pulmonary disease, unspecified: Secondary | ICD-10-CM

## 2019-12-31 NOTE — Telephone Encounter (Signed)
F/u   The patient is asking for a call back from the [place] regarding an appt.   I advise the patient on virtual appt is needed due to her symptoms.    Please advise

## 2020-01-04 ENCOUNTER — Encounter: Payer: Self-pay | Admitting: Internal Medicine

## 2020-01-04 ENCOUNTER — Ambulatory Visit (INDEPENDENT_AMBULATORY_CARE_PROVIDER_SITE_OTHER): Payer: Medicare Other | Admitting: Internal Medicine

## 2020-01-04 ENCOUNTER — Other Ambulatory Visit: Payer: Self-pay

## 2020-01-04 VITALS — BP 136/76 | HR 62 | Temp 97.9°F | Ht 60.0 in | Wt 239.0 lb

## 2020-01-04 DIAGNOSIS — F17200 Nicotine dependence, unspecified, uncomplicated: Secondary | ICD-10-CM | POA: Diagnosis not present

## 2020-01-04 DIAGNOSIS — J449 Chronic obstructive pulmonary disease, unspecified: Secondary | ICD-10-CM | POA: Diagnosis not present

## 2020-01-04 MED ORDER — STIOLTO RESPIMAT 2.5-2.5 MCG/ACT IN AERS: 2.0000 | INHALATION_SPRAY | Freq: Every day | RESPIRATORY_TRACT | 1 refills | Status: DC

## 2020-01-04 MED ORDER — NICOTINE 14 MG/24HR TD PT24: 14.0000 mg | MEDICATED_PATCH | Freq: Every day | TRANSDERMAL | 0 refills | Status: DC

## 2020-01-04 MED ORDER — NICOTINE 7 MG/24HR TD PT24: 7.0000 mg | MEDICATED_PATCH | Freq: Every day | TRANSDERMAL | 0 refills | Status: DC

## 2020-01-04 MED ORDER — NICOTINE 21 MG/24HR TD PT24: 21.0000 mg | MEDICATED_PATCH | Freq: Every day | TRANSDERMAL | 0 refills | Status: DC

## 2020-01-04 NOTE — Patient Instructions (Signed)

## 2020-01-04 NOTE — Progress Notes (Signed)
Subjective:  Patient ID: April May, female    DOB: 07-May-1956  Age: 64 y.o. MRN: [id number]  CC: COPD  This visit occurred during the SARS-CoV-2 public health emergency.  Safety protocols were in place, including screening questions prior to the visit, additional usage of staff PPE, and extensive cleaning of exam room while observing appropriate contact time as indicated for disinfecting solutions.    HPI [facility] April May presents for f/up - She complains of chronic cough intermittently productive of yellow/clear phlegm.  She recently took a course of Augmentin with no relief.  The phlegm is never bloody.  She complains of wheezing and continues to smoke cigarettes.  She denies chest pain, diaphoresis, dizziness, or lightheadedness.  Outpatient Medications Prior to Visit  Medication Sig Dispense Refill  . amLODipine (NORVASC) 10 MG tablet TAKE 1 TABLET BY MOUTH  DAILY 90 tablet 0  . budesonide (ENTOCORT EC) 3 MG 24 hr capsule Take 3 capsules (9 mg total) by mouth daily. 90 capsule 11  . buPROPion (WELLBUTRIN SR) 150 MG 12 hr tablet TAKE 1 TABLET BY MOUTH  TWICE DAILY 180 tablet 0  . BYSTOLIC 10 MG tablet TAKE 1 TABLET BY MOUTH  DAILY 90 tablet 0  . Cholecalciferol (VITAMIN D3) 1.25 MG (50000 UT) CAPS Take 1 capsule by mouth once a week. 12 capsule 3  . clonazePAM (KLONOPIN) 0.5 MG tablet Take 1 tablet (0.5 mg total) by mouth 2 (two) times daily as needed for anxiety. 60 tablet 2  . [employer] PAIN RELIEF EXTRA STRENGTH 500 MG tablet TAKE 1 TABLET BY MOUTH EVERY 8 HOURS AS NEEDED FOR MODERATE PAIN 60 tablet 5  . DULoxetine (CYMBALTA) 60 MG capsule TAKE 1 CAPSULE BY MOUTH  DAILY 90 capsule 0  . fluticasone (FLONASE) 50 MCG/ACT nasal spray Place 2 sprays into both nostrils daily. 48 g 3  . lamoTRIgine (LAMICTAL) 200 MG tablet TAKE 1 TABLET BY MOUTH AT  BEDTIME 90 tablet 0  . mupirocin ointment (BACTROBAN) 2 % SMARTSIG:1 Application Topical 2-3 Times Daily    . MYRBETRIQ 25 MG  TB24 tablet TAKE 1 TABLET BY MOUTH  DAILY 90 tablet 0  . Potassium Citrate 15 MEQ (1620 MG) TBCR Take 1 tablet by mouth in the morning, at noon, and at bedtime. 270 tablet 1  . PROAIR HFA 108 (90 Base) MCG/ACT inhaler TAKE 2 PUFFS BY MOUTH EVERY 6 HOURS AS NEEDED FOR WHEEZE OR SHORTNESS OF BREATH 18 g 5  . spironolactone (ALDACTONE) 100 MG tablet Take 1 tablet (100 mg total) by mouth daily. 90 tablet 0  . amoxicillin-clavulanate (AUGMENTIN) 875-125 MG tablet Take 1 tablet by mouth 2 (two) times daily. 20 tablet 0  . olmesartan (BENICAR) 20 MG tablet Take 1 tablet (20 mg total) by mouth daily. (Patient not taking: Reported on 01/04/2020) 90 tablet 0   No facility-administered medications prior to visit.    ROS Review of Systems  Constitutional: Negative.  Negative for appetite change, chills, diaphoresis, fatigue and fever.  HENT: Negative for facial swelling, sore throat and trouble swallowing.   Eyes: Negative.   Respiratory: Positive for cough and wheezing.   Cardiovascular: Negative for chest pain, palpitations and leg swelling.  Gastrointestinal: Negative for abdominal pain, constipation, diarrhea, nausea and vomiting.  Endocrine: Negative.   Genitourinary: Negative.  Negative for difficulty urinating and dysuria.  Musculoskeletal: Negative.  Negative for arthralgias, back pain, myalgias and neck pain.  Skin: Negative.  Negative for color change and pallor.  Neurological: Negative.  Negative for dizziness, weakness and headaches.  Hematological: Negative for adenopathy. Does not bruise/bleed easily.  Psychiatric/Behavioral: Negative.     Objective:  BP 136/76 (BP Location: Left Arm, Patient Position: Sitting, Cuff Size: Large)   Pulse 62   Temp 97.9 F (36.6 C) (Oral)   Ht 5' (1.524 m)   Wt 239 lb (108.4 kg)   LMP 05/14/1988   SpO2 97%   BMI 46.68 kg/m   BP Readings from Last 3 Encounters:  01/04/20 136/76  12/29/19 128/74  10/28/19 (!) 160/84    Wt Readings from Last  3 Encounters:  01/04/20 239 lb (108.4 kg)  12/29/19 239 lb (108.4 kg)  10/28/19 240 lb (108.9 kg)    Physical Exam Vitals reviewed.  Constitutional:      Appearance: Normal appearance.  HENT:     Nose: Nose normal.     Mouth/Throat:     Mouth: Mucous membranes are moist.  Eyes:     General: No scleral icterus.    Conjunctiva/sclera: Conjunctivae normal.  Cardiovascular:     Rate and Rhythm: Normal rate and regular rhythm.     Heart sounds: No murmur heard.   Pulmonary:     Effort: Pulmonary effort is normal. No tachypnea or respiratory distress.     Breath sounds: Examination of the right-upper field reveals rhonchi. Examination of the left-upper field reveals rhonchi. Examination of the right-middle field reveals rhonchi. Examination of the left-middle field reveals rhonchi. Examination of the right-lower field reveals rhonchi. Examination of the left-lower field reveals rhonchi. Rhonchi present. No decreased breath sounds, wheezing or rales.  Abdominal:     General: Abdomen is protuberant. Bowel sounds are normal. There is no distension.     Palpations: Abdomen is soft. There is no hepatomegaly, splenomegaly or mass.     Tenderness: There is no abdominal tenderness.  Musculoskeletal:        General: Normal range of motion.     Cervical back: Neck supple.     Right lower leg: No edema.     Left lower leg: No edema.  Lymphadenopathy:     Cervical: No cervical adenopathy.  Skin:    General: Skin is warm and dry.  Neurological:     General: No focal deficit present.     Mental Status: She is alert.  Psychiatric:        Mood and Affect: Mood normal.        Behavior: Behavior normal.     Lab Results  Component Value Date   WBC 15.0 (H) 07/28/2019   HGB 15.0 07/28/2019   HCT 45.1 07/28/2019   PLT 273.0 07/28/2019   GLUCOSE 102 (H) 10/28/2019   CHOL 116 04/08/2019   TRIG 88.0 04/08/2019   HDL 44.70 04/08/2019   LDLCALC 53 04/08/2019   ALT 25 10/10/2018   AST 17  10/10/2018   NA 143 10/28/2019   K 2.9 (L) 10/28/2019   CL 101 10/28/2019   CREATININE 0.82 10/28/2019   BUN 8 10/28/2019   CO2 35 (H) 10/28/2019   TSH 1.16 04/08/2019   HGBA1C 6.1 10/28/2019   MICROALBUR <0.7 06/10/2019    MM 3D SCREEN BREAST BILATERAL  Result Date: 10/31/2019 CLINICAL DATA:  Screening. EXAM: DIGITAL SCREENING BILATERAL MAMMOGRAM WITH TOMO AND CAD COMPARISON:  Previous exam(s). ACR Breast Density Category b: There are scattered areas of fibroglandular density. FINDINGS: There are no findings suspicious for malignancy. Images were processed with CAD. IMPRESSION: No mammographic evidence of malignancy. A result letter of  this screening mammogram will be mailed directly to the patient. RECOMMENDATION: Screening mammogram in one year. (Code:SM-B-01Y) BI-RADS CATEGORY  1: Negative. Electronically Signed   By: Audie Pinto M.D.   On: 10/31/2019 11:47    Assessment & Plan:   Kloee was seen today for copd.  Diagnoses and all orders for this visit:  COPD with chronic bronchitis ([place])- I encouraged her to quit smoking.  I have also recommended that she start using a LABA/LAMA combination inhaler.  I gave her samples of Stiolto Respimat and showed her how to use it.  She demonstrated proficiency with its use. -     Tiotropium Bromide-Olodaterol (STIOLTO RESPIMAT) 2.5-2.5 MCG/ACT AERS; Inhale 2 puffs into the lungs daily.  TOBACCO ABUSE- She seems motivated to quit smoking but she is doubtful that her insurance will pay for Chantix or nicotine replacement system.  I recommended that she give NicoDerm a try. -     Ambulatory Referral for Lung Cancer Scre -     nicotine (NICODERM CQ) 7 mg/24hr patch; Place 1 patch (7 mg total) onto the skin daily. -     nicotine (NICODERM CQ) 14 mg/24hr patch; Place 1 patch (14 mg total) onto the skin daily. -     nicotine (NICODERM CQ) 21 mg/24hr patch; Place 1 patch (21 mg total) onto the skin daily.   I have discontinued Shandee W.  Granlund's olmesartan and amoxicillin-clavulanate. I am also having her start on Stiolto Respimat, nicotine, nicotine, and nicotine. Additionally, I am having her maintain her CVS Pain Relief Extra Strength, clonazePAM, ProAir HFA, Vitamin D3, mupirocin ointment, Potassium Citrate, budesonide, spironolactone, lamoTRIgine, buPROPion, amLODipine, Myrbetriq, Bystolic, DULoxetine, and fluticasone.  Meds ordered this encounter  Medications  . Tiotropium Bromide-Olodaterol (STIOLTO RESPIMAT) 2.5-2.5 MCG/ACT AERS    Sig: Inhale 2 puffs into the lungs daily.    Dispense:  12 g    Refill:  1  . nicotine (NICODERM CQ) 7 mg/24hr patch    Sig: Place 1 patch (7 mg total) onto the skin daily.    Dispense:  28 patch    Refill:  0  . nicotine (NICODERM CQ) 14 mg/24hr patch    Sig: Place 1 patch (14 mg total) onto the skin daily.    Dispense:  28 patch    Refill:  0  . nicotine (NICODERM CQ) 21 mg/24hr patch    Sig: Place 1 patch (21 mg total) onto the skin daily.    Dispense:  28 patch    Refill:  0     Follow-up: Return in about 3 months (around 04/05/2020).  Scarlette Calico, MD

## 2020-01-05 ENCOUNTER — Ambulatory Visit (INDEPENDENT_AMBULATORY_CARE_PROVIDER_SITE_OTHER): Payer: Medicare Other | Admitting: Neurology

## 2020-01-05 DIAGNOSIS — G4719 Other hypersomnia: Secondary | ICD-10-CM

## 2020-01-05 DIAGNOSIS — J449 Chronic obstructive pulmonary disease, unspecified: Secondary | ICD-10-CM

## 2020-01-05 DIAGNOSIS — G4733 Obstructive sleep apnea (adult) (pediatric): Secondary | ICD-10-CM

## 2020-01-05 DIAGNOSIS — Z820 Family history of epilepsy and other diseases of the nervous system: Secondary | ICD-10-CM

## 2020-01-05 DIAGNOSIS — R351 Nocturia: Secondary | ICD-10-CM

## 2020-01-05 DIAGNOSIS — Z6841 Body Mass Index (BMI) 40.0 and over, adult: Secondary | ICD-10-CM

## 2020-01-05 DIAGNOSIS — G4720 Circadian rhythm sleep disorder, unspecified type: Secondary | ICD-10-CM

## 2020-01-12 ENCOUNTER — Ambulatory Visit: Payer: Medicare Other | Admitting: Internal Medicine

## 2020-01-14 ENCOUNTER — Encounter: Payer: Self-pay | Admitting: Internal Medicine

## 2020-01-15 NOTE — Procedures (Signed)
PATIENT'S NAME:  April May, Andujar DOB:      1956-04-29      MR#:    [id number]     DATE OF RECORDING: 01/05/2020 REFERRING M.D.:  Scarlette Calico MD Study Performed:   Baseline Polysomnogram HISTORY: 64 year old woman with a history of hypertension, diabetes, depression, allergies, COPD, Crohn's disease, vitamin D deficiency, squamous cell carcinoma, history of small bowel obstruction, and morbid obesity with a BMI of over 1, who was previously diagnosed with obstructive sleep apnea. She is no longer on CPAP therapy. The patient endorsed the Epworth Sleepiness Scale at 9 points. The patient's weight 238 pounds with a height of 60 (inches), resulting in a BMI of 46.7 kg/m2. The patient's neck circumference measured 15.8 inches.  CURRENT MEDICATIONS: Norvasc, Augmentin, Entocort, Wellbutrin, Klonopin, Cymbalta, Flonase, Lamictal, Myrbetriq, Benicar, Proair, Aldactone   PROCEDURE:  This is a multichannel digital polysomnogram utilizing the Somnostar 11.2 system.  Electrodes and sensors were applied and monitored per AASM Specifications.   EEG, EOG, Chin and Limb EMG, were sampled at 200 Hz.  ECG, Snore and Nasal Pressure, Thermal Airflow, Respiratory Effort, CPAP Flow and Pressure, Oximetry was sampled at 50 Hz. Digital video and audio were recorded.      BASELINE STUDY  Lights Out was at 21:37 and Lights On at 05:00.  Total recording time (TRT) was 443 minutes, with a total sleep time (TST) of 199.5 minutes.   The patient's sleep latency was 88.5 minutes, which is delayed. REM latency was 157.5 minutes, which is delayed. The sleep efficiency was 45%, which is markedly reduced.     SLEEP ARCHITECTURE: WASO (Wake after sleep onset) was 154.5 minutes with moderate to severe sleep fragmentation noted. There were 81.5 minutes in Stage N1, 62.5 minutes Stage N2, 24.5 minutes Stage N3 and 31 minutes in Stage REM.  The percentage of Stage N1 was 40.9%, which is markedly increased, Stage N2 was 31.3%, Stage N3  was 12.3% and Stage R (REM sleep) was 15.5%, which is reduced. The arousals were noted as: 93 were spontaneous, 4 were associated with PLMs, 26 were associated with respiratory events.  RESPIRATORY ANALYSIS:  There were a total of 53 respiratory events:  1 obstructive apneas, 0 central apneas and 2 mixed apneas with a total of 3 apneas and an apnea index (AI) of .9 /hour. There were 50 hypopneas with a hypopnea index of 15. /hour. The patient also had 0 respiratory event related arousals (RERAs).      The total APNEA/HYPOPNEA INDEX (AHI) was 15.9/hour and the total RESPIRATORY DISTURBANCE INDEX was  15.9 /hour.  3 events occurred in REM sleep and 97 events in NREM. The REM AHI was  5.8 /hour, versus a non-REM AHI of 17.8. The patient spent 0 minutes of total sleep time in the supine position and 200 minutes in non-supine.. The supine AHI was n/a versus a non-supine AHI of 15.9.  OXYGEN SATURATION & C02:  The Wake baseline 02 saturation was 91%, with the lowest being 82%. Time spent below 89% saturation equaled 81 minutes. PERIODIC LIMB MOVEMENTS: The patient had a total of 38 Periodic Limb Movements.  The Periodic Limb Movement (PLM) index was 11.4 and the PLM Arousal index was 1.2/hour.  Audio and video analysis did not show any abnormal or unusual movements, behaviors, phonations or vocalizations. The patient took 1 bathroom break. Mild to moderate and rare loud snoring was noted. The EKG was in keeping with normal sinus rhythm (NSR).  Post-study, the patient indicated that sleep  was worse than usual.   IMPRESSION:  1. Obstructive Sleep Apnea (OSA) 2. Dysfunctions associated with sleep stages or arousal from sleep  RECOMMENDATIONS:  1. This study demonstrates moderate obstructive sleep apnea, with a total AHI of 15.9/hour, and O2 nadir of 82%.  The absence of supine sleep likely underestimates her AHI and O2 nadir.  Treatment with positive airway pressure in the form of CPAP is recommended.  This will require a full night titration study to optimize therapy. Other treatment options may include, generally speaking, avoidance of supine sleep position along with weight loss, upper airway or jaw surgery in selected patients or the use of an oral appliance in certain patients. ENT evaluation and/or consultation with a maxillofacial surgeon or dentist may be feasible in some instances.    2. Please note that untreated obstructive sleep apnea may carry additional perioperative morbidity. Patients with significant obstructive sleep apnea should receive perioperative PAP therapy and the surgeons and particularly the anesthesiologist should be informed of the diagnosis and the severity of the sleep disordered breathing. 3. This study shows sleep fragmentation and abnormal sleep stage percentages; these are nonspecific findings and per se do not signify an intrinsic sleep disorder or a cause for the patient's sleep-related symptoms. Causes include (but are not limited to) the first night effect of the sleep study, circadian rhythm disturbances, medication effect or an underlying mood disorder or medical problem.  4. The patient should be cautioned not to drive, work at heights, or operate dangerous or heavy equipment when tired or sleepy. Review and reiteration of good sleep hygiene measures should be pursued with any patient. 5. The patient will be seen in follow-up in the sleep clinic at [facility] for discussion of the test results, symptom and treatment compliance review, further management strategies, etc. The referring provider will be notified of the test results.  I certify that I have reviewed the entire raw data recording prior to the issuance of this report in accordance with the Standards of Accreditation of the American Academy of Sleep Medicine (AASM)  Star Age, MD, PhD Diplomat, American Board of Neurology and Sleep Medicine (Neurology and Sleep Medicine)

## 2020-01-15 NOTE — Progress Notes (Signed)
Patient referred by Dr. Ronnald Ramp, seen by me on 12/29/19 for reevaluation of her prior diagnosis of sleep apnea.  She used CPAP for about a year altogether. She had a diagnostic PSG on 01/05/20.   Please call and notify the patient that the recent sleep study showed in the moderate range but she did not sleep very well, less than 50% of the time tested.  She did not sleep on her back. I recommend treatment in the form of CPAP. This will require a repeat sleep study for proper titration and mask fitting and correct monitoring of the oxygen saturations. Please explain to patient. I have placed an order in the chart. Thanks.  Star Age, MD, PhD [facility])

## 2020-01-15 NOTE — Addendum Note (Signed)
Addended by: Star Age on: 01/15/2020 01:20 PM   Modules accepted: Orders

## 2020-01-19 ENCOUNTER — Telehealth: Payer: Self-pay

## 2020-01-19 ENCOUNTER — Other Ambulatory Visit: Payer: Self-pay | Admitting: *Deleted

## 2020-01-19 ENCOUNTER — Telehealth: Payer: Self-pay | Admitting: Internal Medicine

## 2020-01-19 DIAGNOSIS — F17210 Nicotine dependence, cigarettes, uncomplicated: Secondary | ICD-10-CM

## 2020-01-19 DIAGNOSIS — Z87891 Personal history of nicotine dependence: Secondary | ICD-10-CM

## 2020-01-19 NOTE — Telephone Encounter (Signed)
I called pt. I advised pt that Dr. Rexene Alberts reviewed their sleep study results and found that moderate osa was present and recommends that pt be treated with a cpap. Dr. Rexene Alberts recommends that pt return for a repeat sleep study in order to properly titrate the cpap and ensure a good mask fit. Pt is agreeable to returning for a titration study. I advised pt that our sleep lab will file with pt's insurance and call pt to schedule the sleep study when we hear back from the pt's insurance regarding coverage of this sleep study. Pt verbalized understanding of results. Pt had no questions at this time but was encouraged to call back if questions arise.

## 2020-01-19 NOTE — Telephone Encounter (Signed)
-----   Message from Star Age, MD sent at 01/15/2020  1:20 PM EDT ----- Patient referred by Dr. Ronnald Ramp, seen by me on 12/29/19 for reevaluation of her prior diagnosis of sleep apnea.  She used CPAP for about a year altogether. She had a diagnostic PSG on 01/05/20.   Please call and notify the patient that the recent sleep study showed in the moderate range but she did not sleep very well, less than 50% of the time tested.  She did not sleep on her back. I recommend treatment in the form of CPAP. This will require a repeat sleep study for proper titration and mask fitting and correct monitoring of the oxygen saturations. Please explain to patient. I have placed an order in the chart. Thanks.  Star Age, MD, PhD [facility])

## 2020-01-19 NOTE — Telephone Encounter (Signed)
That is the last documented injection I see as well.

## 2020-01-19 NOTE — Telephone Encounter (Signed)
Patient wants to confirm last b12, I saw 7.28, patient believe she had one in August, can you confirm so that I can call her back and schedule?  Thanks!

## 2020-01-22 ENCOUNTER — Other Ambulatory Visit: Payer: Self-pay

## 2020-01-22 ENCOUNTER — Ambulatory Visit (INDEPENDENT_AMBULATORY_CARE_PROVIDER_SITE_OTHER): Payer: Medicare Other | Admitting: Internal Medicine

## 2020-01-22 DIAGNOSIS — E538 Deficiency of other specified B group vitamins: Secondary | ICD-10-CM

## 2020-01-22 DIAGNOSIS — Z23 Encounter for immunization: Secondary | ICD-10-CM | POA: Diagnosis not present

## 2020-01-22 MED ORDER — CYANOCOBALAMIN 1000 MCG/ML IJ SOLN
1000.0000 ug | Freq: Once | INTRAMUSCULAR | Status: AC
  Administered 2020-01-22: 1000 ug via INTRAMUSCULAR

## 2020-01-22 NOTE — Progress Notes (Signed)
I have reviewed and agree.

## 2020-01-24 ENCOUNTER — Ambulatory Visit (INDEPENDENT_AMBULATORY_CARE_PROVIDER_SITE_OTHER): Payer: Medicare Other | Admitting: Neurology

## 2020-01-24 DIAGNOSIS — G4733 Obstructive sleep apnea (adult) (pediatric): Secondary | ICD-10-CM

## 2020-01-24 DIAGNOSIS — G4720 Circadian rhythm sleep disorder, unspecified type: Secondary | ICD-10-CM

## 2020-01-24 DIAGNOSIS — Z820 Family history of epilepsy and other diseases of the nervous system: Secondary | ICD-10-CM

## 2020-01-24 DIAGNOSIS — G4719 Other hypersomnia: Secondary | ICD-10-CM

## 2020-01-24 DIAGNOSIS — R351 Nocturia: Secondary | ICD-10-CM

## 2020-01-24 DIAGNOSIS — J449 Chronic obstructive pulmonary disease, unspecified: Secondary | ICD-10-CM

## 2020-01-25 ENCOUNTER — Encounter: Payer: Self-pay | Admitting: Internal Medicine

## 2020-01-25 ENCOUNTER — Other Ambulatory Visit: Payer: Self-pay | Admitting: Internal Medicine

## 2020-01-25 DIAGNOSIS — F411 Generalized anxiety disorder: Secondary | ICD-10-CM

## 2020-01-25 MED ORDER — CLONAZEPAM 0.5 MG PO TABS: 0.5000 mg | ORAL_TABLET | Freq: Two times a day (BID) | ORAL | 2 refills | Status: DC | PRN

## 2020-02-01 NOTE — Addendum Note (Signed)
Addended by: Star Age on: 02/01/2020 07:57 AM   Modules accepted: Orders

## 2020-02-01 NOTE — Progress Notes (Signed)
Patient referred by Dr. Ronnald Ramp, seen by me on 12/29/19 for reevaluation of her prior diagnosis of sleep apnea.  She used CPAP for about a year altogether. She had a diagnostic PSG on 01/05/20. Patient had a CPAP titration study on 01/24/20.  Please call and inform patient that I have entered an order for treatment with positive airway pressure (PAP) treatment for obstructive sleep apnea (OSA). She did fairly well during the latest sleep study with CPAP. We will, therefore, arrange for a machine for home use through a DME (durable medical equipment) company of Her choice; and I will see the patient back in follow-up in about 10 weeks. Please also explain to the patient that I will be looking out for compliance data, which can be downloaded from the machine (stored on an SD card, that is inserted in the machine) or via remote access through a modem, that is built into the machine. At the time of the followup appointment we will discuss sleep study results and how it is going with PAP treatment at home. Please advise patient to bring Her machine at the time of the first FU visit, even though this is cumbersome. Bringing the machine for every visit after that will likely not be needed, but often helps for the first visit to troubleshoot if needed. Please re-enforce the importance of compliance with treatment and the need for [place] to monitor compliance data - often an insurance requirement and actually good feedback for the patient as far as how they are doing.  Also remind patient, that any interim PAP machine or mask issues should be first addressed with the DME company, as they can often help better with technical and mask fit issues. Please ask if patient has a preference regarding DME company.  Please also make sure, the patient has a follow-up appointment with me in about 10 weeks from the setup date, thanks. May see one of our nurse practitioners if needed for proper timing of the FU appointment.  Please fax or  rout report to the referring provider. Thanks,   Star Age, MD, PhD [facility])

## 2020-02-01 NOTE — Procedures (Signed)
PATIENT'S NAME:  April May, April May DOB:      [birth year]      MR#:    [id number]     DATE OF RECORDING: 01/24/2020 REFERRING M.D.:  Scarlette Calico MD Study Performed:   CPAP  Titration HISTORY: 64 year old woman with a history of hypertension, diabetes, depression, allergies, COPD, Crohn's disease, vitamin D deficiency, squamous cell carcinoma, history of small bowel obstruction, and morbid obesity with a BMI of over 69, who presents for a full night titration study. Her baseline sleep study from 01/05/20 showed moderate obstructive sleep apnea, with a total AHI of 15.9/hour, and O2 nadir of 82%.  The absence of supine sleep likely underestimated her AHI and O2 nadir. The patient endorsed the Epworth Sleepiness Scale at 9 points. The patient's weight 238 pounds with a height of 60 (inches), resulting in a BMI of 46.7 kg/m2. The patient's neck circumference measured 15 inches.  CURRENT MEDICATIONS: Norvasc, Augmentin, Entocort, Wellbutrin, Klonopin, Cymbalta, Flonase, Lamictal, Myrbetriq, Benicar, Proair, Aldactone  PROCEDURE:  This is a multichannel digital polysomnogram utilizing the SomnoStar 11.2 system.  Electrodes and sensors were applied and monitored per AASM Specifications.   EEG, EOG, Chin and Limb EMG, were sampled at 200 Hz.  ECG, Snore and Nasal Pressure, Thermal Airflow, Respiratory Effort, CPAP Flow and Pressure, Oximetry was sampled at 50 Hz. Digital video and audio were recorded.      CPAP was initiated per AASM standards. The patient was fitted with a small wide ResMed AirFit N3 nasal cradle and CPAP was initiated at a pressure of 5 cm. Pressure was advanced to a final pressure of 8 cm, at which time her AHI was 0/h, O2 nadir 89% with nonsupine REM sleep achieved.   Lights Out was at 20:55 and Lights On at 05:09. Total recording time (TRT) was 494.5 minutes, with a total sleep time (TST) of 230.5 minutes. The patient's sleep latency was 39.5 minutes. REM latency was 35 minutes. The sleep  efficiency was 46.6%, which is markedly reduced.    SLEEP ARCHITECTURE: WASO (Wake after sleep onset) was 231 minutes with significant and longer periods of wakefulness and moderate sleep fragmentation noted. There were 35.5 minutes in Stage N1, 47.5 minutes Stage N2, 20.5 minutes Stage N3 and 127 minutes in Stage REM. The percentage of Stage N1 was 15.4%, which is increased, Stage N2 was 20.6%, Stage N3 was 8.9% and Stage R (REM sleep) was 55.1%, which is markedly increased, and in keeping with rebound. The arousals were noted as: 47 were spontaneous, 2 were associated with PLMs, 1 were associated with respiratory events.  RESPIRATORY ANALYSIS:  There was a total of 2 respiratory events: 1 obstructive apneas, 0 central apneas and 0 mixed apneas with a total of 1 apneas and an apnea index (AI) of .3 /hour. There were 1 hypopneas with a hypopnea index of .3/hour. The patient also had 0 respiratory event related arousals (RERAs).      The total APNEA/HYPOPNEA INDEX  (AHI) was .5 /hour and the total RESPIRATORY DISTURBANCE INDEX was .5 /hour  0 events occurred in REM sleep and 2 events in NREM. The REM AHI was 0 /hour versus a non-REM AHI of 1.2 /hour.  The patient spent 19 minutes of total sleep time in the supine position and 212 minutes in non-supine. The supine AHI was 3.2, versus a non-supine AHI of 0.3.  OXYGEN SATURATION & C02:  The baseline 02 saturation was 95%, with the lowest being 88%. Time spent below 89% saturation equaled 0  minutes.  PERIODIC LIMB MOVEMENTS:  The patient had a total of 13 Periodic Limb Movements. The Periodic Limb Movement (PLM) index was 3.4 and the PLM Arousal index was .5 /hour.  Audio and video analysis did not show any abnormal or unusual movements, behaviors, phonations or vocalizations. The patient took 1 bathroom break. The EKG was in keeping with normal sinus rhythm (NSR).   IMPRESSION:   1. Obstructive Sleep Apnea (OSA) 2. Dysfunctions associated with sleep  stages or arousal from sleep   RECOMMENDATIONS:   1. This study demonstrates significant improvement of the patient's obstructive sleep apnea with CPAP therapy. I will, therefore, start the patient on home CPAP treatment at a pressure of 8 cm via SW N30 nasal cradle interface with (heated) humidity. The patient will be advised to be fully compliant with PAP therapy to improve sleep related symptoms and decrease long term cardiovascular risks. The patient should be reminded, that it may take up to 3 months to get fully used to using PAP with all planned sleep. The earlier full compliance is achieved, the better long term compliance tends to be. Please note that untreated obstructive sleep apnea may carry additional perioperative morbidity. Patients with significant obstructive sleep apnea should receive perioperative PAP therapy and the surgeons and particularly the anesthesiologist should be informed of the diagnosis and the severity of the sleep disordered breathing. 2. This study shows sleep fragmentation and abnormal sleep stage percentages; these are nonspecific findings and per se do not signify an intrinsic sleep disorder or a cause for the patient's sleep-related symptoms. Causes include (but are not limited to) the first night effect of the sleep study, circadian rhythm disturbances, medication effect or an underlying mood disorder or medical problem.  3. The patient should be cautioned not to drive, work at heights, or operate dangerous or heavy equipment when tired or sleepy. Review and reiteration of good sleep hygiene measures should be pursued with any patient. 4. The patient will be seen in follow-up in the sleep clinic at [facility] for discussion of the test results, symptom and treatment compliance review, further management strategies, etc. The referring provider will be notified of the test results.   I certify that I have reviewed the entire raw data recording prior to the issuance of this  report in accordance with the Standards of Accreditation of the American Academy of Sleep Medicine (AASM)   Star Age, MD, PhD Diplomat, American Board of Neurology and Sleep Medicine (Neurology and Sleep Medicine)

## 2020-02-02 ENCOUNTER — Encounter: Payer: Self-pay | Admitting: Internal Medicine

## 2020-02-03 ENCOUNTER — Encounter: Payer: Self-pay | Admitting: Acute Care

## 2020-02-03 ENCOUNTER — Other Ambulatory Visit: Payer: Self-pay

## 2020-02-03 ENCOUNTER — Ambulatory Visit (INDEPENDENT_AMBULATORY_CARE_PROVIDER_SITE_OTHER)
Admission: RE | Admit: 2020-02-03 | Discharge: 2020-02-03 | Disposition: A | Discharge status: Home / Self Care | Payer: Medicare Other | Source: Ambulatory Visit | Attending: Acute Care | Admitting: Acute Care

## 2020-02-03 ENCOUNTER — Ambulatory Visit (INDEPENDENT_AMBULATORY_CARE_PROVIDER_SITE_OTHER): Payer: Medicare Other | Admitting: Acute Care

## 2020-02-03 VITALS — BP 130/72 | HR 71 | Temp 97.3°F | Ht 60.0 in | Wt 238.4 lb

## 2020-02-03 DIAGNOSIS — F17210 Nicotine dependence, cigarettes, uncomplicated: Secondary | ICD-10-CM

## 2020-02-03 DIAGNOSIS — Z87891 Personal history of nicotine dependence: Secondary | ICD-10-CM | POA: Diagnosis not present

## 2020-02-03 DIAGNOSIS — Z122 Encounter for screening for malignant neoplasm of respiratory organs: Secondary | ICD-10-CM

## 2020-02-03 NOTE — Patient Instructions (Signed)
Thank you for participating in the [facility]. It was our pleasure to meet you today. We will call you with the results of your scan within the next few days. Your scan will be assigned a Lung RADS category score by the physicians reading the scans.  This Lung RADS score determines follow up scanning.  See below for description of categories, and follow up screening recommendations. We will be in touch to schedule your follow up screening annually or based on recommendations of our providers. We will fax a copy of your scan results to your Primary Care Physician, or the physician who referred you to the program, to ensure they have the results. Please call the office if you have any questions or concerns regarding your scanning experience or results.  Our office number is [phone]. Please speak with Denise Phelps, RN. She is our Lung Cancer Screening RN. If she is unavailable when you call, please have the office staff send her a message. She will return your call at her earliest convenience. Remember, if your scan is normal, we will scan you annually as long as you continue to meet the criteria for the program. (Age 55-77, Current smoker or smoker who has quit within the last 15 years). If you are a smoker, remember, quitting is the single most powerful action that you can take to decrease your risk of lung cancer and other pulmonary, breathing related problems. We know quitting is hard, and we are here to help.  Please let us know if there is anything we can do to help you meet your goal of quitting. If you are a former smoker, congratulations. We are proud of you! Remain smoke free! Remember you can refer friends or family members through the number above.  We will screen them to make sure they meet criteria for the program. Thank you for helping us take better care of you by participating in Lung Screening.  Lung RADS Categories:  Lung RADS 1: no nodules  or definitely non-concerning nodules.  Recommendation is for a repeat annual scan in 12 months.  Lung RADS 2:  nodules that are non-concerning in appearance and behavior with a very low likelihood of becoming an active cancer. Recommendation is for a repeat annual scan in 12 months.  Lung RADS 3: nodules that are probably non-concerning , includes nodules with a low likelihood of becoming an active cancer.  Recommendation is for a 6-month repeat screening scan. Often noted after an upper respiratory illness. We will be in touch to make sure you have no questions, and to schedule your 6-month scan.  Lung RADS 4 A: nodules with concerning findings, recommendation is most often for a follow up scan in 3 months or additional testing based on our provider's assessment of the scan. We will be in touch to make sure you have no questions and to schedule the recommended 3 month follow up scan.  Lung RADS 4 B:  indicates findings that are concerning. We will be in touch with you to schedule additional diagnostic testing based on our provider's  assessment of the scan.   

## 2020-02-03 NOTE — Progress Notes (Signed)
Shared Decision Making Visit Lung Cancer Screening Program [phone])   Eligibility:  Age 64 y.o.  Pack Years Smoking History Calculation 34 pack year smoking history (# packs/per year x # years smoked)  Recent History of coughing up blood  no  Unexplained weight loss? no ( >Than 15 pounds within the last 6 months )  Prior History Lung / other cancer no (Diagnosis within the last 5 years already requiring surveillance chest CT Scans).  Smoking Status Current Smoker  Former Smokers: Years since quit: NA  Quit Date: NA  Visit Components:  Discussion included one or more decision making aids. yes  Discussion included risk/benefits of screening. yes  Discussion included potential follow up diagnostic testing for abnormal scans. yes  Discussion included meaning and risk of over diagnosis. yes  Discussion included meaning and risk of False Positives. yes  Discussion included meaning of total radiation exposure. yes  Counseling Included:  Importance of adherence to annual lung cancer LDCT screening. yes  Impact of comorbidities on ability to participate in the program. yes  Ability and willingness to under diagnostic treatment. yes  Smoking Cessation Counseling:  Current Smokers:   Discussed importance of smoking cessation. yes  Information about tobacco cessation classes and interventions provided to patient. yes  Patient provided with "ticket" for LDCT Scan. yes  Symptomatic Patient. no  Counseling  Diagnosis Code: Tobacco Use Z72.0  Asymptomatic Patient yes  Counseling (Intermediate counseling: > three minutes counseling) D1761  Former Smokers:   Discussed the importance of maintaining cigarette abstinence. yes  Diagnosis Code: Personal History of Nicotine Dependence. Y07.371  Information about tobacco cessation classes and interventions provided to patient. Yes  Patient provided with "ticket" for LDCT Scan. yes  Written Order for Lung Cancer  Screening with LDCT placed in Epic. Yes (CT Chest Lung Cancer Screening Low Dose W/O CM) [id number] Z12.2-Screening of respiratory organs Z87.891-Personal history of nicotine dependence  I have spent 25 minutes of face to face time with April May discussing the risks and benefits of lung cancer screening. We viewed a power point together that explained in detail the above noted topics. We paused at intervals to allow for questions to be asked and answered to ensure understanding.We discussed that the single most powerful action that he can take to decrease  risk of developing lung cancer is to quit smoking. We discussed whether or not she  is ready to commit to setting a quit date. We discussed options for tools to aid in quitting smoking including nicotine replacement therapy, non-nicotine medications, support groups, Quit Smart classes, and behavior modification. We discussed that often times setting smaller, more achievable goals, such as eliminating 1 cigarette a day for a week and then 2 cigarettes a day for a week can be helpful in slowly decreasing the number of cigarettes smoked. This allows for a sense of accomplishment as well as providing a clinical benefit. I gave her the " Be Stronger Than Your Excuses" card with contact information for community resources, classes, free nicotine replacement therapy, and access to mobile apps, text messaging, and on-line smoking cessation help. I have also given her my card and contact information in the event she needs to contact me. We discussed the time and location of the scan, and that either Doroteo Glassman RN or I will call with the results within 24-48 hours of receiving them. I have offered her  a copy of the power point we viewed  as a resource in the event they need reinforcement  of the concepts we discussed today in the office. The patient verbalized understanding of all of  the above and had no further questions upon leaving the office. They have my  contact information in the event they have any further questions.  I spent 3 minutes counseling on smoking cessation and the health risks of continued tobacco abuse.  I explained to the patient that there has been a high incidence of coronary artery disease noted on these exams. I explained that this is a non-gated exam therefore degree or severity cannot be determined. This patient is not on statin therapy. I have asked the patient to follow-up with their PCP regarding any incidental finding of coronary artery disease and management with diet or medication as their PCP  feels is clinically indicated. The patient verbalized understanding of the above and had no further questions upon completion of the visit.   BP 130/72 (BP Location: Left Arm, Cuff Size: Normal)   Pulse 71   Temp (!) 97.3 F (36.3 C) (Oral)   Ht 5' (1.524 m)   Wt 238 lb 6.4 oz (108.1 kg)   LMP 05/14/1988   SpO2 98%   BMI 46.56 kg/m     April Spatz, NP 02/03/2020 10:36 AM

## 2020-02-04 NOTE — Progress Notes (Signed)
Please call patient and let them  know their  low dose Ct was read as a Lung RADS 2: nodules that are benign in appearance and behavior with a very low likelihood of becoming a clinically active cancer due to size or lack of growth. Recommendation per radiology is for a repeat LDCT in 12 months. .Please let them  know we will order and schedule their  annual screening scan for 01/2021. Please let them  know there was notation of CAD on their  scan.  Please remind the patient  that this is a non-gated exam therefore degree or severity of disease  cannot be determined. Please have them  follow up with their PCP regarding potential risk factor modification, dietary therapy or pharmacologic therapy if clinically indicated. Pt.  is not  currently on statin therapy. Please place order for annual  screening scan for  01/2021 and fax results to PCP. Thanks so much.  Langley Gauss, there was notation of Hepatic steatosis. This  is a term that describes the build up of fat in the liver. It is normal to have small amounts of fat in your liver, but when the proportion of liver cells that contain fat exceeds more than 5% it is indicative of early stage fatty liver.Treatment often involves reducing risk factors through a diet and exercise plan. It is generally a benign condition, but in a small percentage of patients it does require follow up. Please have the patient follow up with PCP regarding potential risk factor modification, dietary therapy or pharmacologic therapy if clinically indicated.

## 2020-02-05 ENCOUNTER — Other Ambulatory Visit: Payer: Self-pay | Admitting: *Deleted

## 2020-02-05 DIAGNOSIS — F17210 Nicotine dependence, cigarettes, uncomplicated: Secondary | ICD-10-CM

## 2020-02-15 ENCOUNTER — Other Ambulatory Visit: Payer: Self-pay

## 2020-02-15 ENCOUNTER — Encounter: Payer: Self-pay | Admitting: Internal Medicine

## 2020-02-15 ENCOUNTER — Ambulatory Visit (INDEPENDENT_AMBULATORY_CARE_PROVIDER_SITE_OTHER): Payer: Medicare Other | Admitting: Internal Medicine

## 2020-02-15 VITALS — BP 128/82 | HR 71 | Temp 97.4°F | Ht 60.0 in | Wt 237.6 lb

## 2020-02-15 DIAGNOSIS — Z716 Tobacco abuse counseling: Secondary | ICD-10-CM

## 2020-02-15 DIAGNOSIS — J449 Chronic obstructive pulmonary disease, unspecified: Secondary | ICD-10-CM

## 2020-02-15 NOTE — Patient Instructions (Signed)
The patient should have follow up scheduled with myself in 6 months.   Understanding COPD   What is COPD? COPD stands for chronic obstructive pulmonary (lung) disease. COPD is a general term used for several lung diseases.  COPD is an umbrella term and encompasses other  common diseases in this group like chronic bronchitis and emphysema. Chronic asthma may also be included in this group. While some patients with COPD have only chronic bronchitis or emphysema, most patients have a combination of both.  You might hear these terms used in exchange for one another.   COPD adds to the work of the heart. Diseased lungs may reduce the amount of oxygen that goes to the blood. High blood pressure in blood vessels from the heart to the lungs makes it difficult for the heart to pump. Lung disease can also cause the body to produce too many red blood cells which may make the blood thicker and harder to pump.   Patients who have COPD with low oxygen levels may develop an enlarged heart (cor pulmonale). This condition weakens the heart and causes increased shortness of breath and swelling in the legs and feet.   Chronic bronchitis Chronic bronchitis is irritation and inflammation (swelling) of the lining in the bronchial tubes (air passages). The irritation causes coughing and an excess amount of mucus in the airways. The swelling makes it difficult to get air in and out of the lungs. The small, hair-like structures on the inside of the airways (called cilia) may be damaged by the irritation. The cilia are then unable to help clean mucus from the airways.  Bronchitis is generally considered to be chronic when you have: a productive cough (cough up mucus) and shortness of breath that lasts about 3 months or more each year for 2 or more years in a row. Your doctor may define chronic bronchitis differently.   Emphysema Emphysema is the destruction, or breakdown, of the walls of the alveoli (air sacs) located at the  end of the bronchial tubes. The damaged alveoli are not able to exchange oxygen and carbon dioxide between the lungs and the blood. The bronchioles lose their elasticity and collapse when you exhale, trapping air in the lungs. The trapped air keeps fresh air and oxygen from entering the lungs.   Who is affected by COPD? Emphysema and chronic bronchitis affect approximately 16 million people in the [place], or close to 11 percent of the population.   Symptoms of COPD   Shortness of breath   Shortness of breath with mild exercise (walking, using the stairs, etc.)   Chronic, productive cough (with mucus)   A feeling of "tightness" in the chest   Wheezing   What causes COPD? The two primary causes of COPD are cigarette smoking and alpha1-antitrypsin (AAT) deficiency. Air pollution and occupational dusts may also contribute to COPD, especially when the person exposed to these substances is a cigarette smoker.  Cigarette smoke causes COPD by irritating the airways and creating inflammation that narrows the airways, making it more difficult to breathe. Cigarette smoke also causes the cilia to stop working properly so mucus and trapped particles are not cleaned from the airways. As a result, chronic cough and excess mucus production develop, leading to chronic bronchitis.  In some people, chronic bronchitis and infections can lead to destruction of the small airways, or emphysema.  AAT deficiency, an inherited disorder, can also lead to emphysema. Alpha antitrypsin (AAT) is a protective material produced in the liver  and transported to the lungs to help combat inflammation. When there is not enough of the chemical AAT, the body is no longer protected from an enzyme in the white blood cells.   How is COPD diagnosed?  To diagnose COPD, the physician needs to know: . Do you smoke?  . Have you had chronic exposure to dust or air pollutants?  . Do other members of your family have lung disease?   Marland Kitchen Are you short of breath?  . Do you get short of breath with exercise?  Marland Kitchen Do you have chronic cough and/or wheezing?  Marland Kitchen Do you cough up excess mucus?  To help with the diagnosis, the physician will conduct a thorough physical exam which includes:  1. Listening to your lungs and heart  2. Checking your blood pressure and pulse  3. Examining your nose and throat  4. Checking your feet and ankles for swelling   Laboratory and other tests Several laboratory and other tests are needed to confirm a diagnosis of COPD. These tests may include:  . Chest X-ray to look for lung changes that could be caused by COPD  .  Spirometry and pulmonary function tests (PFTs) to determine lung volume and air flow  . Pulse oximetry to measure the saturation of oxygen in the blood  . Arterial blood gases (ABGs) to determine the amount of oxygen and carbon dioxide in the blood  . Exercise testing to determine if the oxygen level in the blood drops during exercise   Treatment In the beginning stages of COPD, there is minimal shortness of breath that may be noticed only during exercise. As the disease progresses, shortness of breath may worsen and you may need to wear an oxygen device.   To help control other symptoms of COPD, the following treatments and lifestyle changes may be prescribed.  . Quitting smoking  . Avoiding cigarette smoke and other irritants  . Taking medications including: a. bronchodilators b. anti-inflammatory agents c. oxygen d. antibiotics  . Maintaining a healthy diet  . Following a structured exercise program such as pulmonary rehabilitation . Preventing respiratory infections  . Controlling stress   If your COPD progresses, you may be eligible to be evaluated for lung volume reduction surgery or lung transplantation. You may also be eligible to participate in certain clinical trials (research studies). Ask your health care providers about studies being conducted in your hospital.    What is the outlook? Although COPD can not be cured, its symptoms can be treated and your quality of life can be improved. Your prognosis or outlook for the future will depend on how well your lungs are functioning, your symptoms, and how well you respond to and follow your treatment plan.

## 2020-02-15 NOTE — Progress Notes (Signed)
April May    [id number]    02/03/1956  Primary Care Physician:Jones, Arvid Right, MD  Referring Physician: Janith Lima, MD [address] [place],  [place] [address] Reason for Consultation: COPD Date of Consultation: 02/15/2020  Chief complaint:   Chief Complaint  Patient presents with  . Consult    no compliants      HPI: April May is a 64 y.o. woman who is an every day smoker who presents for new patient evaluation for COPD. Seen at the request of Dr. Ronnald Ramp her PCP. She has already seen Eric Form for LDCT for lung cancer screening.   She has dyspnea with stress, elevated blood pressure. She denies history of pneumonia. A couple of weeks ago she had an episode of bronchitis, and has had two in the last year. No hospitalizations or ED visits.  Recently started on stiolto a couple weeks ago.  Has been prescribed albuterol prn and she takes it "very seldom." She uses it about once/week. She hasn't needed albuterol since starting stiolto.   She spends her day sleeping. She is disabled and lives alone and doesn't have any pets. She is minimal active and spends time during the day sleeping.   She quit smoking for 4 years about 5 years ago. She was involved with church and prayed. She started back up again after she got divorced.   Social history:  Occupation: disabled, has worked previously worked in Therapist, art, elder care, daycare.  Exposures: lives alone, no pets. From [place].   Smoking history: 1ppd x 45 years - 45 pack years.   Social History   Occupational History  . Occupation: disabled  Tobacco Use  . Smoking status: Current Every Day Smoker    Packs/day: 1.00    Years: 35.00    Pack years: 35.00    Types: Cigarettes  . Smokeless tobacco: Never Used  . Tobacco comment: 18 cigarettes daily 02/15/20 [facility]   Vaping Use  . Vaping Use: Never used  Substance and Sexual Activity  . Alcohol use: No  . Drug use: No  .  Sexual activity: Not Currently    Relevant family history:  Family History  Problem Relation Age of Onset  . Hypertension Mother   . Ulcerative colitis Mother   . Crohn's disease Mother   . Heart disease Mother   . Kidney disease Mother   . Arthritis Father   . Brain cancer Father   . Diabetes Father   . Alcohol abuse Paternal Uncle   . Diabetes Paternal Grandmother   . Stroke Paternal Grandmother   . Heart failure Sister   . Kidney disease Sister   . Heart failure Brother   . Other Sister        prediabetes  . Kidney cancer Brother     Past Medical History:  Diagnosis Date  . Allergy   . Anxiety   . Benign breast lumps   . COPD (chronic obstructive pulmonary disease) ([place])   . Crohn disease ([place])   . Depression   . Diabetes mellitus type II    borderline, md checks hemaglobin A 1 c at md office   . Hypertension   . Sleep apnea    does not use cpap since weight loss 1 and 1/2 years ago  . Small bowel obstruction ([place]) 2016  . 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 Vitamin D deficiency     Past Surgical  History:  Procedure Laterality Date  . ABDOMINAL HYSTERECTOMY     1 ovary removed  . APPENDECTOMY    . APPLICATION OF WOUND VAC  11/24/2014   Procedure: APPLICATION OF WOUND VAC;  Surgeon: Donnie Mesa, MD;  Location: [facility];  Service: General;;  . BOWEL RESECTION N/A 11/24/2014   Procedure: SMALL BOWEL RESECTION;  Surgeon: Donnie Mesa, MD;  Location: [facility];  Service: General;  Laterality: N/A;  . BREAST EXCISIONAL BIOPSY Right 1972  . BREAST SURGERY Right 40 yrs ago   benign tumor removed  . LAPAROTOMY N/A 11/24/2014   Procedure: EXPLORATORY LAPAROTOMY EXTENSIVE LYSIS OF ADHESIONS SAMLL BOWEL RESECTION RIGHT HEMI COLECTOMY WOUND VAC APPLICATION. ;  Surgeon: Donnie Mesa, MD;  Location: [facility];  Service: General;  Laterality: N/A;  . LYSIS OF ADHESION  11/24/2014   Procedure: LYSIS OF ADHESION (3 HRS);  Surgeon: Donnie Mesa, MD;  Location: [facility];  Service: General;;  . PARTIAL COLECTOMY  11/24/2014   Procedure: RIGHT HEMI COLECTOMY;  Surgeon: Donnie Mesa, MD;  Location: [facility];  Service: General;;  . SMALL INTESTINE SURGERY  age 72     Physical Exam: Blood pressure 128/82, pulse 71, temperature (!) 97.4 F (36.3 C), temperature source Temporal, height 5' (1.524 m), weight 237 lb 9.6 oz (107.8 kg), last menstrual period 05/14/1988, SpO2 97 %. Gen:      No acute distress ENT:  no nasal polyps, mucus membranes moist, mallampati 4 Lungs:    No increased respiratory effort, symmetric chest wall excursion, clear to auscultation bilaterally, no wheezes or crackles CV:         Regular rate and rhythm; no murmurs, rubs, or gallops.  No pedal edema Abd:      Obese, + bowel sounds; soft, non-tender; no distension MSK: no acute synovitis of DIP or PIP joints, no mechanics hands.  Skin:      Warm and dry; no rashes Neuro: normal speech, no focal facial asymmetry Psych: alert and oriented x3, normal mood and affect   Data Reviewed/Medical Decision Making:  Independent interpretation of tests: Imaging: . Review of patient's CT Chest 01/2020 images revealed centrilobular emphysema, a small 83m nodule. The patient's images have been independently reviewed by me.    PFTs: I have personally reviewed the patient's PFTs and in 2015 spirometry was normal with normal lung volumes and diffusion capacity.  PFT Results Latest Ref Rng & Units 03/25/2014  FVC-Pre L 2.56  FVC-Predicted Pre % 103  FVC-Post L 2.60  FVC-Predicted Post % 104  Pre FEV1/FVC % % 87  Post FEV1/FCV % % 90  FEV1-Pre L 2.23  FEV1-Predicted Pre % 114  FEV1-Post L 2.33  DLCO uncorrected ml/min/mmHg 19.19  DLCO UNC% % 89  DLVA Predicted % 105  TLC L 4.49  TLC % Predicted % 94  RV % Predicted % 101    Labs:  Lab Results  Component Value Date   WBC 15.0 (H) 07/28/2019   HGB 15.0 07/28/2019   HCT 45.1 07/28/2019   MCV 96.7 07/28/2019   PLT 273.0 07/28/2019    Lab Results  Component Value Date   NA 143 10/28/2019   K 2.9 (L) 10/28/2019   CL 101 10/28/2019   CO2 35 (H) 10/28/2019    Immunization status:  Immunization History  Administered Date(s) Administered  . Influenza Split 04/04/2011, 02/15/2012  . Influenza Whole 04/30/2008, 04/01/2009  . Influenza,inj,Quad PF,6+ Mos 02/24/2015, 03/21/2016, 01/31/2017, 01/08/2018, 01/08/2019, 01/22/2020  . PFIZER SARS-COV-2 Vaccination 07/18/2019, 08/08/2019  .  PPD Test 10/16/2013, 03/05/2018  . Pneumococcal Conjugate-13 02/24/2015  . Pneumococcal Polysaccharide-23 02/15/2012, 08/20/2017  . Td 12/31/2008  . Tdap 10/16/2013    . I reviewed prior external note(s) from PCP and lung cancer screening clinic . I reviewed the result(s) of the labs and imaging as noted above.    Assessment:  COPD, mild OSA on CPAP Ongoing tobacco use disorder  Plan/Recommendations: Ms. patient has a new diagnosis of COPD and was recently started on Stiolto.  We did extensive education today on COPD disease management and progression.  We discussed the importance of taking her daily maintenance inhaler and keeping albuterol with her as needed.  She says that her current barriers to smoking cessation her mental health and getting that under better control.  I personally spent 6 minutes counseling the patient regarding tobacco use disorder.  Patient is symptomatic from tobacco use disorder due to the following condition: COPD.  The patient's response was contemplative.  We discussed nicotine replacement therapy, Wellbutrin.  We identified to gather patient specific barriers to change.  The patient is open to future discussions about tobacco cessation.  We reviewed her pulmonary function testing and CT scans with her and her my concerns that this disease is likely progress if she does not quit smoking.  For now she should continue Stiolto and as needed albuterol.  She is up-to-date on her vaccinations including Covid and  influenza.  I instructed her on return to care precautions especially in the setting of impending COPD exacerbation.  I spent 60 minutes in the care of this patient today including pre-charting, chart review, review of results, face-to-face care, coordination of care and communication with consultants etc.).   Return to Care: Return in about 6 months (around 08/15/2020).  Lenice Llamas, MD [facility] [place]  CC: Janith Lima, MD

## 2020-03-03 ENCOUNTER — Other Ambulatory Visit: Payer: Self-pay | Admitting: Internal Medicine

## 2020-03-03 DIAGNOSIS — I10 Essential (primary) hypertension: Secondary | ICD-10-CM

## 2020-03-11 ENCOUNTER — Other Ambulatory Visit: Payer: Self-pay | Admitting: Internal Medicine

## 2020-03-11 ENCOUNTER — Encounter: Payer: Self-pay | Admitting: Internal Medicine

## 2020-03-18 ENCOUNTER — Ambulatory Visit (INDEPENDENT_AMBULATORY_CARE_PROVIDER_SITE_OTHER): Payer: Medicare Other

## 2020-03-18 ENCOUNTER — Other Ambulatory Visit: Payer: Self-pay

## 2020-03-18 DIAGNOSIS — E538 Deficiency of other specified B group vitamins: Secondary | ICD-10-CM | POA: Diagnosis not present

## 2020-03-18 MED ORDER — CYANOCOBALAMIN 1000 MCG/ML IJ SOLN
1000.0000 ug | Freq: Once | INTRAMUSCULAR | Status: AC
  Administered 2020-03-18: 1000 ug via INTRAMUSCULAR

## 2020-03-18 NOTE — Telephone Encounter (Signed)
Pt was here today for a B12 injection. She stated she would like to know is it ok for her to stop taking the Cymbalta cold [place] or should she taper off of it? She also asked if you were going to substitute the medication for something else? She also stated you had previously mentioned her seeing a therapist that she declined at the time but she would now like the referral to speak with one. She would like for PCP to reply to her MyChart with the responses due to her having telephone troubles with getting calls.

## 2020-03-18 NOTE — Progress Notes (Signed)
Pt given S97 w/o complications.

## 2020-04-01 ENCOUNTER — Other Ambulatory Visit: Payer: Self-pay | Admitting: Internal Medicine

## 2020-04-01 DIAGNOSIS — F09 Unspecified mental disorder due to known physiological condition: Secondary | ICD-10-CM

## 2020-04-01 DIAGNOSIS — I10 Essential (primary) hypertension: Secondary | ICD-10-CM

## 2020-04-15 DIAGNOSIS — B9689 Other specified bacterial agents as the cause of diseases classified elsewhere: Secondary | ICD-10-CM | POA: Diagnosis not present

## 2020-04-15 DIAGNOSIS — L680 Hirsutism: Secondary | ICD-10-CM | POA: Diagnosis not present

## 2020-04-15 DIAGNOSIS — D1724 Benign lipomatous neoplasm of skin and subcutaneous tissue of left leg: Secondary | ICD-10-CM | POA: Diagnosis not present

## 2020-04-15 DIAGNOSIS — L02828 Furuncle of other sites: Secondary | ICD-10-CM | POA: Diagnosis not present

## 2020-04-18 ENCOUNTER — Other Ambulatory Visit: Payer: Self-pay

## 2020-04-18 ENCOUNTER — Ambulatory Visit (INDEPENDENT_AMBULATORY_CARE_PROVIDER_SITE_OTHER): Payer: Medicare Other

## 2020-04-18 ENCOUNTER — Telehealth: Payer: Self-pay

## 2020-04-18 VITALS — BP 150/90

## 2020-04-18 DIAGNOSIS — E538 Deficiency of other specified B group vitamins: Secondary | ICD-10-CM

## 2020-04-18 MED ORDER — CYANOCOBALAMIN 1000 MCG/ML IJ SOLN
1000.0000 ug | INTRAMUSCULAR | Status: DC
  Administered 2020-04-18 – 2020-05-26 (×2): 1000 ug via INTRAMUSCULAR

## 2020-04-18 NOTE — Progress Notes (Signed)
Pt here for monthly B12 injection per Dr Ronnald Ramp.  B12 1043mcg given IM right deltoid and pt tolerated injection well.  Pt requested BP check.  Pt states "I have been running around stressed".  States she has been taking HTN meds as prescribed. Advised to scheduled OV if persistent HTN that she feels is uncontrolled.  Pt declines to schedule OV at this time.

## 2020-04-18 NOTE — Telephone Encounter (Signed)
April Lima, MD  You 18 minutes ago (10:10 AM)   She needs a monthly B12 injection   Routing comment

## 2020-04-18 NOTE — Telephone Encounter (Signed)
Last order for B12 was 06/09/18.  Need frequency for this order.  Pt was last seen 01/04/20 for f/u OV for cough.  Last b12 drawn 10/10/18.  Will send to PCP for order clarification.

## 2020-04-25 ENCOUNTER — Encounter: Payer: Self-pay | Admitting: Neurology

## 2020-04-25 ENCOUNTER — Telehealth: Payer: Self-pay | Admitting: Neurology

## 2020-04-25 NOTE — Telephone Encounter (Signed)
Pt called inquiring about where things stand re: her machine as a result of her sleep study.  Pt was given the name and number to her DME([facility]).  This is Pharmacist, hospital

## 2020-04-25 NOTE — Telephone Encounter (Signed)
I called pt to discuss. No answer, left a message asking her to call me back. 

## 2020-04-25 NOTE — Telephone Encounter (Signed)
Pt called, would like to discuss next step after completing sleep study in September. Would like a call from the nurse.

## 2020-04-25 NOTE — Telephone Encounter (Signed)
Pt returned my call. I discussed her titration study results and recommendations. Pt is agreeable to starting cpap. I will send the orer to [facility]. I discussed cpap compliance requirements. An appt was made for 08/10/20 at 10:30am with Dr. Rexene Alberts. Pt verbalized understanding of results and recommendations. Pt had no questions at this time but was encouraged to call back if questions arise. Order was sent to [facility].

## 2020-05-04 ENCOUNTER — Telehealth: Payer: Self-pay | Admitting: Internal Medicine

## 2020-05-04 NOTE — Progress Notes (Signed)
Please call patients Cell Phone Number for appointment.    Chronic Care Management   Note  05/04/2020 Name: Suvi Archuletta MRN: [id number] DOB: [birth year]  Faustino Congress Manrique is a 64 y.o. year old female who is a primary care patient of Janith Lima, MD. I reached out to Benay Spice by phone today in response to a referral sent by Ms. Faustino Congress Woolbright's PCP, Janith Lima, MD.   Ms. Steier was given information about Chronic Care Management services today including:  1. CCM service includes personalized support from designated clinical staff supervised by her physician, including individualized plan of care and coordination with other care providers 2. 24/7 contact phone numbers for assistance for urgent and routine care needs. 3. Service will only be billed when office clinical staff spend 20 minutes or more in a month to coordinate care. 4. Only one practitioner may furnish and bill the service in a calendar month. 5. The patient may stop CCM services at any time (effective at the end of the month) by phone call to the office staff.   Patient agreed to services and verbal consent obtained.   Follow up plan:   Carley Perdue [employer] Scheduler

## 2020-05-26 ENCOUNTER — Other Ambulatory Visit: Payer: Self-pay

## 2020-05-26 ENCOUNTER — Ambulatory Visit (INDEPENDENT_AMBULATORY_CARE_PROVIDER_SITE_OTHER): Payer: Medicare Other

## 2020-05-26 DIAGNOSIS — E538 Deficiency of other specified B group vitamins: Secondary | ICD-10-CM

## 2020-05-26 NOTE — Progress Notes (Signed)
Pt here for monthly B12 injection per  Dr Ronnald Ramp.  B12 1064mg given IM left deltoid and pt tolerated injection well.  Pt to schedule next B12 injection upon check out.

## 2020-06-16 ENCOUNTER — Other Ambulatory Visit: Payer: Self-pay | Admitting: Internal Medicine

## 2020-06-16 DIAGNOSIS — I10 Essential (primary) hypertension: Secondary | ICD-10-CM

## 2020-06-21 ENCOUNTER — Encounter: Payer: Self-pay | Admitting: Internal Medicine

## 2020-06-21 ENCOUNTER — Telehealth: Payer: Medicare Other

## 2020-06-21 ENCOUNTER — Ambulatory Visit (INDEPENDENT_AMBULATORY_CARE_PROVIDER_SITE_OTHER): Payer: Medicare Other | Admitting: Internal Medicine

## 2020-06-21 ENCOUNTER — Other Ambulatory Visit: Payer: Self-pay

## 2020-06-21 VITALS — BP 138/86 | HR 62 | Temp 98.2°F | Resp 16 | Ht 60.0 in | Wt 238.0 lb

## 2020-06-21 DIAGNOSIS — E785 Hyperlipidemia, unspecified: Secondary | ICD-10-CM | POA: Diagnosis not present

## 2020-06-21 DIAGNOSIS — H6123 Impacted cerumen, bilateral: Secondary | ICD-10-CM | POA: Diagnosis not present

## 2020-06-21 DIAGNOSIS — Z0001 Encounter for general adult medical examination with abnormal findings: Secondary | ICD-10-CM

## 2020-06-21 DIAGNOSIS — E559 Vitamin D deficiency, unspecified: Secondary | ICD-10-CM

## 2020-06-21 DIAGNOSIS — I10 Essential (primary) hypertension: Secondary | ICD-10-CM

## 2020-06-21 DIAGNOSIS — E538 Deficiency of other specified B group vitamins: Secondary | ICD-10-CM

## 2020-06-21 DIAGNOSIS — T502X5A Adverse effect of carbonic-anhydrase inhibitors, benzothiadiazides and other diuretics, initial encounter: Secondary | ICD-10-CM | POA: Diagnosis not present

## 2020-06-21 DIAGNOSIS — R7303 Prediabetes: Secondary | ICD-10-CM

## 2020-06-21 DIAGNOSIS — R001 Bradycardia, unspecified: Secondary | ICD-10-CM | POA: Diagnosis not present

## 2020-06-21 DIAGNOSIS — E876 Hypokalemia: Secondary | ICD-10-CM | POA: Diagnosis not present

## 2020-06-21 DIAGNOSIS — Z0000 Encounter for general adult medical examination without abnormal findings: Secondary | ICD-10-CM | POA: Diagnosis not present

## 2020-06-21 LAB — HEPATIC FUNCTION PANEL
ALT: 21 U/L (ref 0–35)
AST: 15 U/L (ref 0–37)
Albumin: 4.3 g/dL (ref 3.5–5.2)
Alkaline Phosphatase: 47 U/L (ref 39–117)
Bilirubin, Direct: 0.1 mg/dL (ref 0.0–0.3)
Total Bilirubin: 0.6 mg/dL (ref 0.2–1.2)
Total Protein: 7.4 g/dL (ref 6.0–8.3)

## 2020-06-21 LAB — LIPID PANEL
Cholesterol: 120 mg/dL (ref 0–200)
HDL: 43.3 mg/dL (ref >39.00)
LDL Cholesterol: 58 mg/dL (ref 0–99)
NonHDL: 76.3
Total CHOL/HDL Ratio: 3
Triglycerides: 90 mg/dL (ref 0.0–149.0)
VLDL: 18 mg/dL (ref 0.0–40.0)

## 2020-06-21 LAB — CBC WITH DIFFERENTIAL/PLATELET
Basophils Absolute: 0 10*3/uL (ref 0.0–0.1)
Basophils Relative: 0.4 % (ref 0.0–3.0)
Eosinophils Absolute: 0.1 10*3/uL (ref 0.0–0.7)
Eosinophils Relative: 1.2 % (ref 0.0–5.0)
HCT: 47 % — ABNORMAL HIGH (ref 36.0–46.0)
Hemoglobin: 15.4 g/dL — ABNORMAL HIGH (ref 12.0–15.0)
Lymphocytes Relative: 33.8 % (ref 12.0–46.0)
Lymphs Abs: 4 10*3/uL (ref 0.7–4.0)
MCHC: 32.8 g/dL (ref 30.0–36.0)
MCV: 97.1 fl (ref 78.0–100.0)
Monocytes Absolute: 1 10*3/uL (ref 0.1–1.0)
Monocytes Relative: 8.6 % (ref 3.0–12.0)
Neutro Abs: 6.6 10*3/uL (ref 1.4–7.7)
Neutrophils Relative %: 56 % (ref 43.0–77.0)
Platelets: 250 10*3/uL (ref 150.0–400.0)
RBC: 4.84 Mil/uL (ref 3.87–5.11)
RDW: 14.8 % (ref 11.5–15.5)
WBC: 11.8 10*3/uL — ABNORMAL HIGH (ref 4.0–10.5)

## 2020-06-21 LAB — BASIC METABOLIC PANEL
BUN: 8 mg/dL (ref 6–23)
CO2: 31 mEq/L (ref 19–32)
Calcium: 9.9 mg/dL (ref 8.4–10.5)
Chloride: 104 mEq/L (ref 96–112)
Creatinine, Ser: 0.94 mg/dL (ref 0.40–1.20)
GFR: 64.06 mL/min (ref >60.00)
Glucose, Bld: 85 mg/dL (ref 70–99)
Potassium: 3.8 mEq/L (ref 3.5–5.1)
Sodium: 142 mEq/L (ref 135–145)

## 2020-06-21 LAB — TSH: TSH: 1.89 u[IU]/mL (ref 0.35–4.50)

## 2020-06-21 LAB — MAGNESIUM: Magnesium: 2 mg/dL (ref 1.5–2.5)

## 2020-06-21 LAB — VITAMIN D 25 HYDROXY (VIT D DEFICIENCY, FRACTURES): VITD: 60.14 ng/mL (ref 30.00–100.00)

## 2020-06-21 LAB — FOLATE: Folate: 10.3 ng/mL (ref >5.9)

## 2020-06-21 LAB — HEMOGLOBIN A1C: Hgb A1c MFr Bld: 5.7 % (ref 4.6–6.5)

## 2020-06-21 MED ORDER — CYANOCOBALAMIN 1000 MCG/ML IJ SOLN
1000.0000 ug | Freq: Once | INTRAMUSCULAR | Status: AC
Start: 2020-06-21 — End: 2020-06-21
  Administered 2020-06-21: 1000 ug via INTRAMUSCULAR

## 2020-06-21 NOTE — Progress Notes (Signed)
Patient consent obtained. Irrigation with water and peroxide performed. Full view of tympanic membranes after procedure.  Patient tolerated procedure well.

## 2020-06-21 NOTE — Progress Notes (Deleted)
Chronic Care Management Pharmacy Note  06/21/2020 Name:  April May MRN:  [id number] DOB:  1956/01/17  Subjective: April May is an 65 y.o. year old female who is a primary patient of Janith Lima, MD.  The CCM team was consulted for assistance with disease management and care coordination needs.    Engaged with patient by telephone for initial visit in response to provider referral for pharmacy case management and/or care coordination services.   Consent to Services:  The patient was given the following information about Chronic Care Management services today, agreed to services, and gave verbal consent: 1. CCM service includes personalized support from designated clinical staff supervised by the primary care provider, including individualized plan of care and coordination with other care providers 2. 24/7 contact phone numbers for assistance for urgent and routine care needs. 3. Service will only be billed when office clinical staff spend 20 minutes or more in a month to coordinate care. 4. Only one practitioner may furnish and bill the service in a calendar month. 5.The patient may stop CCM services at any time (effective at the end of the month) by phone call to the office staff. 6. The patient will be responsible for cost sharing (co-pay) of up to 20% of the service fee (after annual deductible is met). Patient agreed to services and consent obtained.  Patient Care Team: Janith Lima, MD as PCP - General (Internal Medicine) Charlton Haws, [facility] as Pharmacist (Pharmacist)  Recent office visits: 01/04/20 Dr Ronnald Ramp OV: chronic cough. Rx'd Stiolto. Rx'd nicotine patches. Dc'd olmesartan d/t pt not taking. Referred for lung cancer screening.  Recent consult visits: 02/15/20 Dr Shearon Stalls (pulmonary): initial eval for COPD. Advised to quit smoking. No med changes.  02/03/20 NP Eric Form (pulmonary): lung cancer screening  12/24/19 Dr Nevada Crane (dermatology): f/u for  skin cancer  3/15/1 Dr Fuller Plan (GI): f/u Crohn's disease. Refilled budesonide.  Objective:  Lab Results  Component Value Date   CREATININE 0.82 10/28/2019   BUN 8 10/28/2019   GFR 84.92 10/28/2019   GFRNONAA >60 03/12/2017   GFRAA >60 03/12/2017   NA 143 10/28/2019   K 2.9 (L) 10/28/2019   CALCIUM 10.0 10/28/2019   CO2 35 (H) 10/28/2019    Lab Results  Component Value Date/Time   HGBA1C 6.1 10/28/2019 01:36 PM   HGBA1C 6.0 06/10/2019 11:03 AM   GFR 84.92 10/28/2019 01:36 PM   GFR 92.78 07/28/2019 10:45 AM   MICROALBUR <0.7 06/10/2019 11:03 AM   MICROALBUR 10 01/31/2017 04:29 PM   MICROALBUR 1.3 02/24/2015 10:38 AM    Last diabetic Eye exam:  Lab Results  Component Value Date/Time   HMDIABEYEEXA No Retinopathy 06/21/2017 02:55 PM    Last diabetic Foot exam:  Lab Results  Component Value Date/Time   HMDIABFOOTEX normal 02/15/2012 12:00 AM     Lab Results  Component Value Date   CHOL 116 04/08/2019   HDL 44.70 04/08/2019   LDLCALC 53 04/08/2019   TRIG 88.0 04/08/2019   CHOLHDL 3 04/08/2019    Hepatic Function Latest Ref Rng & Units 10/10/2018 09/18/2016 07/26/2016  Total Protein 6.0 - 8.3 g/dL 7.3 7.2 7.2  Albumin 3.5 - 5.2 g/dL 4.1 4.2 3.9  AST 0 - 37 U/L 17 14 23   ALT 0 - 35 U/L 25 15 17   Alk Phosphatase 39 - 117 U/L 45 67 71  Total Bilirubin 0.2 - 1.2 mg/dL 0.5 0.4 0.8  Bilirubin, Direct 0.1 - 0.5 mg/dL - - -  Lab Results  Component Value Date/Time   TSH 1.16 04/08/2019 11:28 AM   TSH 3.68 10/10/2018 11:32 AM    CBC Latest Ref Rng & Units 07/28/2019 06/10/2019 04/08/2019  WBC 4.0 - 10.5 K/uL 15.0(H) 13.2(H) 11.8(H)  Hemoglobin 12.0 - 15.0 g/dL 15.0 14.8 14.9  Hematocrit 36.0 - 46.0 % 45.1 45.0 44.6  Platelets 150.0 - 400.0 K/uL 273.0 297.0 269.0    Lab Results  Component Value Date/Time   VD25OH 46.04 06/10/2019 11:03 AM   VD25OH 57.96 01/27/2019 10:29 AM    Clinical ASCVD: No  The ASCVD Risk score Mikey Bussing DC Jr., et al., 2013) failed to  calculate for the following reasons:   The valid total cholesterol range is 130 to 320 mg/dL    Depression screen [facility] 2/9 06/21/2020 01/04/2020 10/28/2019  Decreased Interest 0 1 1  Down, Depressed, Hopeless 0 1 2  PHQ - 2 Score 0 2 3  Altered sleeping 0 2 3  Tired, decreased energy 0 1 3  Change in appetite 0 1 2  Feeling bad or failure about yourself  0 0 0  Trouble concentrating 0 1 1  Moving slowly or fidgety/restless 0 0 0  Suicidal thoughts 0 0 0  PHQ-9 Score 0 7 12  Difficult doing work/chores - Not difficult at all Somewhat difficult  Some recent data might be hidden    GAD 7 : Generalized Anxiety Score 01/08/2018 10/02/2017  Nervous, Anxious, on Edge 1 2  Control/stop worrying 3 2  Worry too much - different things 2 2  Trouble relaxing 2 1  Restless 0 0  Easily annoyed or irritable 0 1  Afraid - awful might happen 1 1  Total GAD 7 Score 9 9  Anxiety Difficulty Somewhat difficult -    Social History   Tobacco Use  Smoking Status Current Every Day Smoker  . Packs/day: 1.00  . Years: 35.00  . Pack years: 35.00  . Types: Cigarettes  Smokeless Tobacco Never Used  Tobacco Comment   18 cigarettes daily 02/15/20 [facility]    BP Readings from Last 3 Encounters:  06/21/20 (!) 142/90  04/18/20 (!) 150/90  02/15/20 128/82   Pulse Readings from Last 3 Encounters:  06/21/20 62  02/15/20 71  02/03/20 71   Wt Readings from Last 3 Encounters:  06/21/20 238 lb (108 kg)  02/15/20 237 lb 9.6 oz (107.8 kg)  02/03/20 238 lb 6.4 oz (108.1 kg)    Assessment/Interventions: Review of patient past medical history, allergies, medications, health status, including review of consultants reports, laboratory and other test data, was performed as part of comprehensive evaluation and provision of chronic care management services.   SDOH:  (Social Determinants of Health) assessments and interventions performed: yes   CCM Care Plan  Allergies  Allergen Reactions  . Azathioprine Other (See  Comments)    Side effects to pancrease     . Crestor [Rosuvastatin Calcium] Other (See Comments)    Muscle and joint aches  . Lipitor [Atorvastatin Calcium] Other (See Comments)    Muscle cramps  . Lisinopril Cough    Medications Reviewed Today    Reviewed by Jari Pigg, [place] (Certified Medical Assistant) on 06/21/20 at [place] List Status: <None>  Medication Order Taking? Sig Documenting Provider Last Dose Status Informant  amLODipine (NORVASC) 10 MG tablet [id number] Yes TAKE 1 TABLET BY MOUTH  DAILY Janith Lima, MD Taking Active   budesonide (ENTOCORT EC) 3 MG 24 hr capsule [id number] Yes Take 3  capsules (9 mg total) by mouth daily. Milus Banister, MD Taking Active   buPROPion [facility] SR) 150 MG 12 hr tablet [id number] Yes TAKE 1 TABLET BY MOUTH  TWICE DAILY Janith Lima, MD Taking Active   Cholecalciferol (VITAMIN D3) 1.25 MG (50000 UT) CAPS [id number] Yes Take 1 capsule by mouth once a week. Janith Lima, MD Taking Active   clonazePAM [facility] [place]) 0.5 MG tablet [id number] Yes Take 1 tablet (0.5 mg total) by mouth 2 (two) times daily as needed for anxiety. Janith Lima, MD Taking Active   [facility] PAIN RELIEF EXTRA STRENGTH 500 MG tablet [id number] Yes TAKE 1 TABLET BY MOUTH EVERY 8 HOURS AS NEEDED FOR MODERATE PAIN Janith Lima, MD Taking Active   cyanocobalamin ((VITAMIN B-12)) injection 1,000 mcg [id number]   Janith Lima, MD  Active   fluticasone [facility]) 50 MCG/ACT nasal spray [id number] Yes Place 2 sprays into both nostrils daily. Janith Lima, MD Taking Active   lamoTRIgine (LAMICTAL) 200 MG tablet [id number] Yes TAKE 1 TABLET BY MOUTH AT  BEDTIME Janith Lima, MD Taking Active   mupirocin ointment (BACTROBAN) 2 % [id number] Yes SMARTSIG:1 Application Topical 2-3 Times Daily [provider] Taking Active   MYRBETRIQ 25 MG TB24 tablet [id number] Yes TAKE 1 TABLET BY MOUTH  DAILY Janith Lima, MD Taking Active   nebivolol (BYSTOLIC) 10 MG tablet  [id number] Yes TAKE 1 TABLET BY MOUTH  DAILY Janith Lima, MD Taking Active   nicotine (NICODERM CQ) 14 mg/24hr patch [id number] Yes Place 1 patch (14 mg total) onto the skin daily. Janith Lima, MD Taking Active   nicotine (NICODERM CQ) 21 mg/24hr patch [id number] Yes Place 1 patch (21 mg total) onto the skin daily. Janith Lima, MD Taking Active   nicotine (NICODERM CQ) 7 mg/24hr patch [id number] Yes Place 1 patch (7 mg total) onto the skin daily. Janith Lima, MD Taking Active   Potassium Citrate 15 MEQ (1620 MG) TBCR [id number] Yes Take 1 tablet by mouth in the morning, at noon, and at bedtime. Janith Lima, MD Taking Active   PROAIR HFA 108 [phone] Base) MCG/ACT inhaler [id number] Yes TAKE 2 PUFFS BY MOUTH EVERY 6 HOURS AS NEEDED FOR WHEEZE OR SHORTNESS OF Cherly Beach, MD Taking Active   spironolactone (ALDACTONE) 100 MG tablet [id number] Yes Take 1 tablet (100 mg total) by mouth daily. Janith Lima, MD Taking Active   Tiotropium Bromide-Olodaterol (STIOLTO RESPIMAT) 2.5-2.5 MCG/ACT AERS [id number] Yes Inhale 2 puffs into the lungs daily. Janith Lima, MD Taking Active           Patient Active Problem List   Diagnosis Date Noted  . OAB (overactive bladder) 08/25/2019  . Cognitive dysfunction associated with depression 04/08/2019  . Severe episode of recurrent major depressive disorder, without psychotic features ([place]) 04/08/2019  . DDD (degenerative disc disease), lumbar 06/26/2018  . Vitamin B12 deficiency 10/08/2017  . Abnormal electrocardiogram (ECG) (EKG) 10/08/2017  . Vitamin D deficiency 08/20/2017  . 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 DDD (degenerative disc disease), cervical 04/27/2017  . GAD (generalized anxiety disorder) 12/03/2016  . Aortic ejection murmur 03/25/2016  . Crohn's disease of both small and large intestine with fistula ([place]) 11/24/2014  . COPD with chronic bronchitis ([place]) 03/11/2014  . Diuretic-induced hypokalemia  01/22/2013  . Hyperlipidemia LDL goal <130 06/13/2011  . Sleep apnea 04/27/2011  . Prediabetes 03/01/2011  . Obesity, Class II, BMI 35-39.9, with  comorbidity 04/01/2009  . TOBACCO ABUSE 02/24/2008  . ALLERGIC RHINITIS 02/24/2008  . Depression 08/26/2007  . Malignant hypertension 08/04/2007    Immunization History  Administered Date(s) Administered  . Influenza Split 04/04/2011, 02/15/2012  . Influenza Whole 04/30/2008, 04/01/2009  . Influenza,inj,Quad PF,6+ Mos 02/24/2015, 03/21/2016, 01/31/2017, 01/08/2018, 01/08/2019, 01/22/2020  . PFIZER(Purple Top)SARS-COV-2 Vaccination 07/18/2019, 08/08/2019, 03/04/2020  . PPD Test 10/16/2013, 03/05/2018  . Pneumococcal Conjugate-13 02/24/2015  . Pneumococcal Polysaccharide-23 02/15/2012, 08/20/2017  . Td 12/31/2008  . Tdap 10/16/2013    Conditions to be addressed/monitored:  Hypertension, Hyperlipidemia, COPD, Depression, Anxiety, Tobacco use, Overactive Bladder and Crohn's disease  There are no care plans that you recently modified to display for this patient.    Medication Assistance: {MEDASSISTANCEINFO:25044}  Patient's preferred pharmacy is:  [facility], [address], [address] [place], [place] [address] Phone: [phone] Fax: [phone]  [id number] [place] [id number]: [phone]: [phone] Uses pill box? {Yes or If no, why not?:20788} Pt endorses ***% compliance  We discussed: {Pharmacy options:24294} Patient decided to: {US Pharmacy PQHUT:65465} Follow Up:  {FOLLOWUP:24991}  Plan: {CM FOLLOW UP PLAN:25073}  ***    Current Barriers:  . {pharmacybarriers:24917} . ***  Pharmacist Clinical Goal(s):  .Marland KitchenOver the next *** days, patient will {PHARMACYGOALCHOICES:24921} through collaboration with PharmD and provider.   . ***  Interventions: . 1:1 collaboration with JJanith Lima MD regarding development and update of comprehensive plan of care as evidenced by provider attestation and co-signature . Inter-disciplinary care team collaboration (see longitudinal plan of care) . Comprehensive medication review performed; medication list updated in electronic medical record  Hypertension (BP goal <130/80) -{CHL Controlled/Uncontrolled:8197322485} -Current treatment: . Amlodipine 10 mg daily . Bystolic 10 mg daily . Spironolactone 100 mg daily -Medications previously tried: ***  -Current home readings: *** -Current dietary habits: *** -Current exercise habits: *** -{ACTIONS;DENIES/REPORTS:21021675::"Denies"} hypotensive/hypertensive symptoms -Educated on {CCM BP Counseling:25124} -Counseled to monitor BP at home ***, document, and provide log at future appointments -{CCMPHARMDINTERVENTION:25122}  Hyperlipidemia: (LDL goal < 100) -{CHL Controlled/Uncontrolled:8197322485} -Current treatment: . No medication -Medications previously tried: atorvastatin, rosuvastatin (muscle cramps) -Current dietary patterns: *** -Current exercise habits: *** -Educated on {CCM HLD Counseling:25126} -{CCMPHARMDINTERVENTION:25122}  COPD (Goal: control symptoms and prevent exacerbations) -{CHL Controlled/Uncontrolled:8197322485} -Current treatment  . Stiolto (tiotropium-olodaterol) 2.5-2.5 mcg/act 2 puffs daily . Albuterol HFA prn -Medications previously tried: Incruse  -Pulmonary function testing:  FEV1 119% predicted, FEV1/FVC 0.90 -Gold Grade: Gold 1 (FEV1>80%) -Current COPD Classification:  {CHL HP [facility] Pharm COPD [phone]} -MMRC/CAT score: *** -Exacerbations requiring treatment in last 6 months: *** -Patient {Actions; denies-reports:120008} consistent use of maintenance inhaler -Frequency of rescue inhaler use: *** -Counseled on  {CCMINHALERCOUNSELING:25121} -{CCMPHARMDINTERVENTION:25122}  Depression/Anxiety (Goal: ***) -{CHL Controlled/Uncontrolled:8197322485} -Current treatment: . Bupropion SR 150 mg BID . Lamotrigine 200 mg HS . Clonazepam 0.5 mg BID prn -Medications previously tried/failed: duloxetine, Trintellix, sertraline -PHQ9: 7 -GAD7: 9 -Connected with PCP for mental health support -Educated on {CCM mental health counseling:25127} -{CCMPHARMDINTERVENTION:25122}  Tobacco use (Goal ***) -{CHL Controlled/Uncontrolled:8197322485} -Current treatment  . Bupropion SR 150 mg BID . Nicotine patch -Patient smokes {Time to first cigarette:23873} -Patient triggers include: {Smoking Triggers:23882} -On a scale of 1-10, reports MOTIVATION to quit is *** -On a scale of 1-10, reports CONFIDENCE in quitting is *** -Previous quit attempts: *** -{Smoking Cessation Counseling:23883} -{CCMPHARMDINTERVENTION:25122}  Crohn's  Disease (Goal: ***) -{CHL Controlled/[phone]} -Current treatment  . Budesonide 3 mg - 3 capsules daily -Medications previously tried: ***  -{CCMPHARMDINTERVENTION:25122}  Overactive bladder (Goal: manage symptoms) -{CHL Controlled/[phone]} -Current treatment  . Myrbetriq 25 mg daily -Medications previously tried: ***  -{CCMPHARMDINTERVENTION:25122}  Health Maintenance -Vaccine gaps: Shingrix -Current therapy:  Marland Kitchen Vitamin D 50,000 IU weekly . Fluticasone nasal spray PRN . Tylenol 500 mg q8h PRN . Potassium citrate 15 mEq TID -{CCM Patient satisfied:25129} -{CCMPHARMDINTERVENTION:25122}  Patient Goals/Self-Care Activities . Over the next *** days, patient will:  - {pharmacypatientgoals:24919}  Follow Up Plan: {CM FOLLOW UP FVWA:67737}

## 2020-06-21 NOTE — Patient Instructions (Signed)
Health Maintenance, Female Adopting a healthy lifestyle and getting preventive care are important in promoting health and wellness. Ask your health care provider about:  The right schedule for you to have regular tests and exams.  Things you can do on your own to prevent diseases and keep yourself healthy. What should I know about diet, weight, and exercise? Eat a healthy diet  Eat a diet that includes plenty of vegetables, fruits, low-fat dairy products, and lean protein.  Do not eat a lot of foods that are high in solid fats, added sugars, or sodium.   Maintain a healthy weight Body mass index (BMI) is used to identify weight problems. It estimates body fat based on height and weight. Your health care provider can help determine your BMI and help you achieve or maintain a healthy weight. Get regular exercise Get regular exercise. This is one of the most important things you can do for your health. Most adults should:  Exercise for at least 150 minutes each week. The exercise should increase your heart rate and make you sweat (moderate-intensity exercise).  Do strengthening exercises at least twice a week. This is in addition to the moderate-intensity exercise.  Spend less time sitting. Even light physical activity can be beneficial. Watch cholesterol and blood lipids Have your blood tested for lipids and cholesterol at 65 years of age, then have this test every 5 years. Have your cholesterol levels checked more often if:  Your lipid or cholesterol levels are high.  You are older than 65 years of age.  You are at high risk for heart disease. What should I know about cancer screening? Depending on your health history and family history, you may need to have cancer screening at various ages. This may include screening for:  Breast cancer.  Cervical cancer.  Colorectal cancer.  Skin cancer.  Lung cancer. What should I know about heart disease, diabetes, and high blood  pressure? Blood pressure and heart disease  High blood pressure causes heart disease and increases the risk of stroke. This is more likely to develop in people who have high blood pressure readings, are of African descent, or are overweight.  Have your blood pressure checked: ? Every 3-5 years if you are 18-39 years of age. ? Every year if you are 40 years old or older. Diabetes Have regular diabetes screenings. This checks your fasting blood sugar level. Have the screening done:  Once every three years after age 40 if you are at a normal weight and have a low risk for diabetes.  More often and at a younger age if you are overweight or have a high risk for diabetes. What should I know about preventing infection? Hepatitis B If you have a higher risk for hepatitis B, you should be screened for this virus. Talk with your health care provider to find out if you are at risk for hepatitis B infection. Hepatitis C Testing is recommended for:  Everyone born from 1945 through 1965.  Anyone with known risk factors for hepatitis C. Sexually transmitted infections (STIs)  Get screened for STIs, including gonorrhea and chlamydia, if: ? You are sexually active and are younger than 65 years of age. ? You are older than 65 years of age and your health care provider tells you that you are at risk for this type of infection. ? Your sexual activity has changed since you were last screened, and you are at increased risk for chlamydia or gonorrhea. Ask your health care provider   if you are at risk.  Ask your health care provider about whether you are at high risk for HIV. Your health care provider may recommend a prescription medicine to help prevent HIV infection. If you choose to take medicine to prevent HIV, you should first get tested for HIV. You should then be tested every 3 months for as long as you are taking the medicine. Pregnancy  If you are about to stop having your period (premenopausal) and  you may become pregnant, seek counseling before you get pregnant.  Take 400 to 800 micrograms (mcg) of folic acid every day if you become pregnant.  Ask for birth control (contraception) if you want to prevent pregnancy. Osteoporosis and menopause Osteoporosis is a disease in which the bones lose minerals and strength with aging. This can result in bone fractures. If you are 65 years old or older, or if you are at risk for osteoporosis and fractures, ask your health care provider if you should:  Be screened for bone loss.  Take a calcium or vitamin D supplement to lower your risk of fractures.  Be given hormone replacement therapy (HRT) to treat symptoms of menopause. Follow these instructions at home: Lifestyle  Do not use any products that contain nicotine or tobacco, such as cigarettes, e-cigarettes, and chewing tobacco. If you need help quitting, ask your health care provider.  Do not use street drugs.  Do not share needles.  Ask your health care provider for help if you need support or information about quitting drugs. Alcohol use  Do not drink alcohol if: ? Your health care provider tells you not to drink. ? You are pregnant, may be pregnant, or are planning to become pregnant.  If you drink alcohol: ? Limit how much you use to 0-1 drink a day. ? Limit intake if you are breastfeeding.  Be aware of how much alcohol is in your drink. In the [place] one drink equals one 12 oz bottle of beer (355 mL), one 5 oz glass of wine (148 mL), or one 1 oz glass of hard liquor (44 mL). General instructions  Schedule regular health, dental, and eye exams.  Stay current with your vaccines.  Tell your health care provider if: ? You often feel depressed. ? You have ever been abused or do not feel safe at home. Summary  Adopting a healthy lifestyle and getting preventive care are important in promoting health and wellness.  Follow your health care provider's instructions about healthy  diet, exercising, and getting tested or screened for diseases.  Follow your health care provider's instructions on monitoring your cholesterol and blood pressure. This information is not intended to replace advice given to you by your health care provider. Make sure you discuss any questions you have with your health care provider. Document Revised: 04/23/2018 Document Reviewed: 04/23/2018 Elsevier Patient Education  2021 [employer]  

## 2020-06-21 NOTE — Progress Notes (Signed)
Subjective:  Patient ID: April May, female    DOB: 1955/12/23  Age: 65 y.o. MRN: [id number]  CC: Annual Exam, Hypertension, and Hyperlipidemia  This visit occurred during the SARS-CoV-2 public health emergency.  Safety protocols were in place, including screening questions prior to the visit, additional usage of staff PPE, and extensive cleaning of exam room while observing appropriate contact time as indicated for disinfecting solutions.    HPI [facility] [place] presents for a CPX.  She has an extensively positive review of systems.  She tells me she stays up all night watching TV and sleeps during the day.  She feels bored.  She complains of chronic weakness, dizziness, fatigue, insomnia, and unable to lose weight.  She denies headache, blurred vision, chest pain, shortness of breath, or edema.   Outpatient Medications Prior to Visit  Medication Sig Dispense Refill  . amLODipine (NORVASC) 10 MG tablet TAKE 1 TABLET BY MOUTH  DAILY 90 tablet 1  . budesonide (ENTOCORT EC) 3 MG 24 hr capsule Take 3 capsules (9 mg total) by mouth daily. 90 capsule 11  . buPROPion (WELLBUTRIN SR) 150 MG 12 hr tablet TAKE 1 TABLET BY MOUTH  TWICE DAILY 180 tablet 1  . Cholecalciferol (VITAMIN D3) 1.25 MG (50000 UT) CAPS Take 1 capsule by mouth once a week. 12 capsule 3  . clonazePAM (KLONOPIN) 0.5 MG tablet Take 1 tablet (0.5 mg total) by mouth 2 (two) times daily as needed for anxiety. 60 tablet 2  . [employer] PAIN RELIEF EXTRA STRENGTH 500 MG tablet TAKE 1 TABLET BY MOUTH EVERY 8 HOURS AS NEEDED FOR MODERATE PAIN 60 tablet 5  . fluticasone (FLONASE) 50 MCG/ACT nasal spray Place 2 sprays into both nostrils daily. 48 g 3  . lamoTRIgine (LAMICTAL) 200 MG tablet TAKE 1 TABLET BY MOUTH AT  BEDTIME 90 tablet 0  . mupirocin ointment (BACTROBAN) 2 % SMARTSIG:1 Application Topical 2-3 Times Daily    . MYRBETRIQ 25 MG TB24 tablet TAKE 1 TABLET BY MOUTH  DAILY 90 tablet 0  . nebivolol (BYSTOLIC) 10 MG  tablet TAKE 1 TABLET BY MOUTH  DAILY 90 tablet 0  . nicotine (NICODERM CQ) 14 mg/24hr patch Place 1 patch (14 mg total) onto the skin daily. 28 patch 0  . nicotine (NICODERM CQ) 21 mg/24hr patch Place 1 patch (21 mg total) onto the skin daily. 28 patch 0  . nicotine (NICODERM CQ) 7 mg/24hr patch Place 1 patch (7 mg total) onto the skin daily. 28 patch 0  . Potassium Citrate 15 MEQ (1620 MG) TBCR Take 1 tablet by mouth in the morning, at noon, and at bedtime. 270 tablet 1  . PROAIR HFA 108 (90 Base) MCG/ACT inhaler TAKE 2 PUFFS BY MOUTH EVERY 6 HOURS AS NEEDED FOR WHEEZE OR SHORTNESS OF BREATH 18 g 5  . spironolactone (ALDACTONE) 100 MG tablet Take 1 tablet (100 mg total) by mouth daily. 90 tablet 0  . Tiotropium Bromide-Olodaterol (STIOLTO RESPIMAT) 2.5-2.5 MCG/ACT AERS Inhale 2 puffs into the lungs daily. 12 g 1   Facility-Administered Medications Prior to Visit  Medication Dose Route Frequency Provider Last Rate Last Admin  . cyanocobalamin ((VITAMIN B-12)) injection 1,000 mcg  1,000 mcg Intramuscular Q30 days Janith Lima, MD   1,000 mcg at 05/26/20 0955    ROS Review of Systems  Constitutional: Positive for fatigue. Negative for appetite change, chills, diaphoresis, fever and unexpected weight change.  HENT: Positive for hearing loss. Negative for ear pain and trouble  swallowing.   Eyes: Negative.   Respiratory: Negative for cough, chest tightness, shortness of breath and wheezing.   Cardiovascular: Negative for chest pain, palpitations and leg swelling.  Gastrointestinal: Negative for abdominal pain, constipation, diarrhea, nausea and vomiting.  Genitourinary: Negative.  Negative for difficulty urinating and dysuria.  Musculoskeletal: Negative.   Skin: Negative.   Neurological: Positive for dizziness and weakness. Negative for light-headedness and numbness.  Hematological: Negative for adenopathy. Does not bruise/bleed easily.  Psychiatric/Behavioral: Positive for dysphoric mood  and sleep disturbance. Negative for behavioral problems, confusion, hallucinations and self-injury. The patient is not nervous/anxious and is not hyperactive.     Objective:  BP 138/86   Pulse 62   Temp 98.2 F (36.8 C) (Oral)   Resp 16   Ht 5' (1.524 m)   Wt 238 lb (108 kg)   LMP 05/14/1988   SpO2 98%   BMI 46.48 kg/m   BP Readings from Last 3 Encounters:  06/21/20 138/86  04/18/20 (!) 150/90  02/15/20 128/82    Wt Readings from Last 3 Encounters:  06/21/20 238 lb (108 kg)  02/15/20 237 lb 9.6 oz (107.8 kg)  02/03/20 238 lb 6.4 oz (108.1 kg)    Physical Exam Vitals reviewed.  Constitutional:      Appearance: Normal appearance.  HENT:     Right Ear: Tympanic membrane, ear canal and external ear normal. Decreased hearing noted. There is impacted cerumen.     Left Ear: Tympanic membrane, ear canal and external ear normal. Decreased hearing noted. There is impacted cerumen.     Nose: Nose normal.     Mouth/Throat:     Mouth: Mucous membranes are moist.  Eyes:     General: No scleral icterus.    Conjunctiva/sclera: Conjunctivae normal.  Cardiovascular:     Rate and Rhythm: Regular rhythm. Bradycardia present.     Heart sounds: Murmur heard.   Systolic murmur is present with a grade of 1/6.  No diastolic murmur is present. No gallop.      Comments: EKG- Sinus bradycardia, 53 bpm NS T wave changes No LVH or Q waves Pulmonary:     Effort: Pulmonary effort is normal.     Breath sounds: No stridor. No wheezing, rhonchi or rales.  Abdominal:     General: Abdomen is flat.     Palpations: There is no mass.     Tenderness: There is no abdominal tenderness. There is no guarding.  Musculoskeletal:        General: Normal range of motion.     Cervical back: Neck supple.     Right lower leg: No edema.     Left lower leg: No edema.  Lymphadenopathy:     Cervical: No cervical adenopathy.  Skin:    General: Skin is warm and dry.     Coloration: Skin is not pale.   Neurological:     General: No focal deficit present.     Mental Status: She is alert. Mental status is at baseline.  Psychiatric:        Mood and Affect: Mood normal.        Behavior: Behavior normal.     Lab Results  Component Value Date   WBC 11.8 (H) 06/21/2020   HGB 15.4 (H) 06/21/2020   HCT 47.0 (H) 06/21/2020   PLT 250.0 06/21/2020   GLUCOSE 85 06/21/2020   CHOL 120 06/21/2020   TRIG 90.0 06/21/2020   HDL 43.30 06/21/2020   LDLCALC 58 06/21/2020   ALT  21 06/21/2020   AST 15 06/21/2020   NA 142 06/21/2020   K 3.8 06/21/2020   CL 104 06/21/2020   CREATININE 0.94 06/21/2020   BUN 8 06/21/2020   CO2 31 06/21/2020   TSH 1.89 06/21/2020   HGBA1C 5.7 06/21/2020   MICROALBUR <0.7 06/10/2019    CT CHEST LUNG CA SCREEN LOW DOSE W/O CM  Result Date: 02/03/2020 CLINICAL DATA:  Lung cancer screening. Thirty-four pack-year history. Current asymptomatic smoker. EXAM: CT CHEST WITHOUT CONTRAST LOW-DOSE FOR LUNG CANCER SCREENING TECHNIQUE: Multidetector CT imaging of the chest was performed following the standard protocol without IV contrast. COMPARISON:  None. FINDINGS: Cardiovascular: Heart size is normal. No pericardial effusion. Aortic atherosclerosis. Coronary artery atherosclerotic calcifications noted. Mediastinum/Nodes: No enlarged mediastinal, hilar, or axillary lymph nodes. Thyroid gland, trachea, and esophagus demonstrate no significant findings. Lungs/Pleura: Centrilobular emphysema. No pleural effusion, airspace consolidation or atelectasis. Scar noted within the right middle lobe. Single tiny lung nodule in the anterolateral right lower lobe has an equivalent diameter 1.9 mm. Upper Abdomen: No acute abnormality identified within the imaged portions of the upper abdomen. Hepatic steatosis noted. Musculoskeletal: No acute abnormality.  Thoracic spondylosis noted. IMPRESSION: 1. Lung-RADS 2, benign appearance or behavior. Continue annual screening with low-dose chest CT without  contrast in 12 months. 2. Coronary artery calcifications noted. 3. Hepatic steatosis. Aortic Atherosclerosis (ICD10-I70.0) and Emphysema (ICD10-J43.9). Electronically Signed   By: Kerby Moors M.D.   On: 02/03/2020 13:35    Assessment & Plan:   Hoa was seen today for annual exam, hypertension and hyperlipidemia.  Diagnoses and all orders for this visit:  Malignant hypertension- Her blood pressure is adequately well controlled. -     CBC with Differential/Platelet; Future -     Basic metabolic panel; Future -     TSH; Future -     EKG 12-Lead -     TSH -     Basic metabolic panel -     CBC with Differential/Platelet  Diuretic-induced hypokalemia- Her potassium level is normal now. -     Basic metabolic panel; Future -     Magnesium; Future -     Magnesium -     Basic metabolic panel  Hyperlipidemia LDL goal <130- Statin therapy is not indicated. -     Lipid panel; Future -     Hepatic function panel; Future -     Hepatic function panel -     Lipid panel  Prediabetes -     Hemoglobin A1c; Future -     Hemoglobin A1c  Vitamin B12 deficiency- Will continue parenteral B12 replacement therapy. -     Folate; Future -     cyanocobalamin ((VITAMIN B-12)) injection 1,000 mcg -     Folate  Vitamin D deficiency- Her vitamin D level is normal now. -     VITAMIN D 25 Hydroxy (Vit-D Deficiency, Fractures); Future -     VITAMIN D 25 Hydroxy (Vit-D Deficiency, Fractures)  Hearing loss due to cerumen impaction, bilateral- Her hearing returned to normal after the cerumen was removed.  Encounter for general adult medical examination with abnormal findings- Exam completed, labs reviewed, vaccines reviewed and updated, cancer screenings are up-to-date, patient education was given.  Bradycardia- She has a positive review of systems.  I have asked her to see cardiology to see if this is contributing to her symptoms. -     Ambulatory referral to Cardiology   I am having Budd Palmer maintain her [facility] Pain  Relief Extra Strength, ProAir HFA, Vitamin D3, mupirocin ointment, Potassium Citrate, budesonide, spironolactone, lamoTRIgine, Myrbetriq, fluticasone, Stiolto Respimat, nicotine, nicotine, nicotine, clonazePAM, amLODipine, buPROPion, and nebivolol. We administered cyanocobalamin. We will continue to administer cyanocobalamin.  Meds ordered this encounter  Medications  . cyanocobalamin ((VITAMIN B-12)) injection 1,000 mcg     Follow-up: Return in about 3 months (around 09/18/2020).  Scarlette Calico, MD

## 2020-06-23 ENCOUNTER — Encounter: Payer: Self-pay | Admitting: Internal Medicine

## 2020-06-23 DIAGNOSIS — R001 Bradycardia, unspecified: Secondary | ICD-10-CM | POA: Insufficient documentation

## 2020-06-23 DIAGNOSIS — Z0001 Encounter for general adult medical examination with abnormal findings: Secondary | ICD-10-CM | POA: Insufficient documentation

## 2020-06-24 NOTE — Addendum Note (Signed)
Addended by: Janith Lima on: 06/24/2020 11:29 AM   Modules accepted: Orders

## 2020-06-27 ENCOUNTER — Ambulatory Visit: Payer: Medicare Other

## 2020-07-14 DIAGNOSIS — L720 Epidermal cyst: Secondary | ICD-10-CM | POA: Diagnosis not present

## 2020-07-14 DIAGNOSIS — D225 Melanocytic nevi of trunk: Secondary | ICD-10-CM | POA: Diagnosis not present

## 2020-07-14 DIAGNOSIS — Z1283 Encounter for screening for malignant neoplasm of skin: Secondary | ICD-10-CM | POA: Diagnosis not present

## 2020-07-15 ENCOUNTER — Encounter: Payer: Self-pay | Admitting: Cardiology

## 2020-07-15 ENCOUNTER — Ambulatory Visit: Payer: Medicare Other | Admitting: Cardiology

## 2020-07-15 ENCOUNTER — Other Ambulatory Visit: Payer: Self-pay | Admitting: Gastroenterology

## 2020-07-15 ENCOUNTER — Other Ambulatory Visit: Payer: Self-pay

## 2020-07-15 VITALS — BP 140/90 | HR 66 | Ht 60.0 in | Wt 242.0 lb

## 2020-07-15 DIAGNOSIS — E785 Hyperlipidemia, unspecified: Secondary | ICD-10-CM

## 2020-07-15 DIAGNOSIS — R001 Bradycardia, unspecified: Secondary | ICD-10-CM

## 2020-07-15 DIAGNOSIS — I2584 Coronary atherosclerosis due to calcified coronary lesion: Secondary | ICD-10-CM | POA: Diagnosis not present

## 2020-07-15 DIAGNOSIS — R9431 Abnormal electrocardiogram [ECG] [EKG]: Secondary | ICD-10-CM

## 2020-07-15 DIAGNOSIS — I2510 Atherosclerotic heart disease of native coronary artery without angina pectoris: Secondary | ICD-10-CM | POA: Diagnosis not present

## 2020-07-15 DIAGNOSIS — R5383 Other fatigue: Secondary | ICD-10-CM | POA: Diagnosis not present

## 2020-07-15 MED ORDER — ASPIRIN EC 81 MG PO TBEC: 81.0000 mg | DELAYED_RELEASE_TABLET | Freq: Every day | ORAL | 3 refills | Status: DC

## 2020-07-15 MED ORDER — ROSUVASTATIN CALCIUM 10 MG PO TABS: 10.0000 mg | ORAL_TABLET | Freq: Every day | ORAL | 3 refills | Status: DC

## 2020-07-15 NOTE — Patient Instructions (Signed)
Medication Instructions:  Start Aspirin 81 mg daily Crestor 63m daily *If you need a refill on your cardiac medications before your next appointment, please call your pharmacy*   Lab Work: None Lipid, ALT in 3 months  If you have labs (blood work) drawn today and your tests are completely normal, you will receive your results only by: .Marland KitchenMyChart Message (if you have MyChart) OR . A paper copy in the mail If you have any lab test that is abnormal or we need to change your treatment, we will call you to review the results.   Follow-Up: [facility] our [facility].  As part of our continuing mission to provide you with exceptional heart care, we have created designated Provider Care Teams.  These Care Teams include your primary Cardiologist (physician) and Advanced Practice Providers (APPs -  Physician Assistants and Nurse Practitioners) who all work together to provide you with the care you need, when you need it.  We recommend signing up for the patient portal called "MyChart".  Sign up information is provided on this After Visit Summary.  MyChart is used to connect with patients for Virtual Visits (Telemedicine).  Patients are able to view lab/test results, encounter notes, upcoming appointments, etc.  Non-urgent messages can be sent to your provider as well.   To learn more about what you can do with MyChart, go to [email or web address]    Your next appointment:   3 month(s)  The format for your next appointment:   In Person  Provider:   You may see MCandee Furbish MD or one of the following Advanced Practice Providers on your designated Care Team:    JKathyrn Drown NP

## 2020-07-15 NOTE — Progress Notes (Signed)
Cardiology Office Note:    Date:  07/15/2020   ID:  Benay Spice, DOB [birth year], MRN [id number]  PCP:  Janith Lima, MD   [place]  Cardiologist:  Candee Furbish, MD  Advanced Practice Provider:  No care team member to display Electrophysiologist:  None       Referring MD: Janith Lima, MD     History of Present Illness:    April May is a 65 y.o. female here for the evaluation of bradycardia and coronary artery calcification at the request of Dr. Scarlette Calico.  Recently has been having some bradycardia and has felt fatigued.  An EKG performed on 06/21/2020 showed sinus bradycardia rate 53 bpm with nonspecific ST-T wave changes.  She has been feeling chronic weakness dizziness fatigue insomnia.  Denies any chest pain or shortness of breath.  Also had a lung cancer screening CT of chest on 02/03/2020 that showed aortic atherosclerosis as well as coronary artery calcifications.  Past Medical History:  Diagnosis Date  . Allergy   . Anxiety   . Benign breast lumps   . COPD (chronic obstructive pulmonary disease) ([place])   . Crohn disease ([place])   . Depression   . Diabetes mellitus type II    borderline, md checks hemaglobin A 1 c at md office   . Hypertension   . Sleep apnea    does not use cpap since weight loss 1 and 1/2 years ago  . Small bowel obstruction ([place]) 2016  . 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 Vitamin D deficiency     Past Surgical History:  Procedure Laterality Date  . ABDOMINAL HYSTERECTOMY     1 ovary removed  . APPENDECTOMY    . APPLICATION OF WOUND VAC  11/24/2014   Procedure: APPLICATION OF WOUND VAC;  Surgeon: Donnie Mesa, MD;  Location: [facility];  Service: General;;  . BOWEL RESECTION N/A 11/24/2014   Procedure: SMALL BOWEL RESECTION;  Surgeon: Donnie Mesa, MD;  Location: [facility];  Service: General;  Laterality: N/A;  . BREAST EXCISIONAL BIOPSY Right 1972  . BREAST SURGERY Right  40 yrs ago   benign tumor removed  . LAPAROTOMY N/A 11/24/2014   Procedure: EXPLORATORY LAPAROTOMY EXTENSIVE LYSIS OF ADHESIONS SAMLL BOWEL RESECTION RIGHT HEMI COLECTOMY WOUND VAC APPLICATION. ;  Surgeon: Donnie Mesa, MD;  Location: [facility];  Service: General;  Laterality: N/A;  . LYSIS OF ADHESION  11/24/2014   Procedure: LYSIS OF ADHESION (3 HRS);  Surgeon: Donnie Mesa, MD;  Location: [facility];  Service: General;;  . PARTIAL COLECTOMY  11/24/2014   Procedure: RIGHT HEMI COLECTOMY;  Surgeon: Donnie Mesa, MD;  Location: [facility];  Service: General;;  . SMALL INTESTINE SURGERY  age 7    Current Medications: Current Meds  Medication Sig  . amLODipine (NORVASC) 10 MG tablet TAKE 1 TABLET BY MOUTH  DAILY  . aspirin EC 81 MG tablet Take 1 tablet (81 mg total) by mouth daily. Swallow whole.  . budesonide (ENTOCORT EC) 3 MG 24 hr capsule Take 3 capsules (9 mg total) by mouth daily.  Marland Kitchen buPROPion (WELLBUTRIN SR) 150 MG 12 hr tablet TAKE 1 TABLET BY MOUTH  TWICE DAILY  . Cholecalciferol (VITAMIN D3) 1.25 MG (50000 UT) CAPS Take 1 capsule by mouth once a week.  . clonazePAM (KLONOPIN) 0.5 MG tablet Take 1 tablet (0.5 mg total) by mouth 2 (two) times daily as needed for anxiety.  . [employer] PAIN RELIEF  EXTRA STRENGTH 500 MG tablet TAKE 1 TABLET BY MOUTH EVERY 8 HOURS AS NEEDED FOR MODERATE PAIN  . fluticasone (FLONASE) 50 MCG/ACT nasal spray Place 2 sprays into both nostrils daily.  Marland Kitchen lamoTRIgine (LAMICTAL) 200 MG tablet TAKE 1 TABLET BY MOUTH AT  BEDTIME  . mupirocin ointment (BACTROBAN) 2 % SMARTSIG:1 Application Topical 2-3 Times Daily  . MYRBETRIQ 25 MG TB24 tablet TAKE 1 TABLET BY MOUTH  DAILY  . nebivolol (BYSTOLIC) 10 MG tablet TAKE 1 TABLET BY MOUTH  DAILY  . nicotine (NICODERM CQ) 14 mg/24hr patch Place 1 patch (14 mg total) onto the skin daily.  . Potassium Citrate 15 MEQ (1620 MG) TBCR Take 1 tablet by mouth in the morning, at noon, and at bedtime.  Marland Kitchen PROAIR HFA 108 (90 Base) MCG/ACT inhaler  TAKE 2 PUFFS BY MOUTH EVERY 6 HOURS AS NEEDED FOR WHEEZE OR SHORTNESS OF BREATH  . rosuvastatin (CRESTOR) 10 MG tablet Take 1 tablet (10 mg total) by mouth daily.  Marland Kitchen spironolactone (ALDACTONE) 100 MG tablet Take 1 tablet (100 mg total) by mouth daily.  . Tiotropium Bromide-Olodaterol (STIOLTO RESPIMAT) 2.5-2.5 MCG/ACT AERS Inhale 2 puffs into the lungs daily.   Current Facility-Administered Medications for the 07/15/20 encounter (Office Visit) with Jerline Pain, MD  Medication  . cyanocobalamin ((VITAMIN B-12)) injection 1,000 mcg     Allergies:   Azathioprine, Crestor [rosuvastatin calcium], Lipitor [atorvastatin calcium], and Lisinopril   Social History   Socioeconomic History  . Marital status: Divorced    Spouse name: Not on file  . Number of children: 2  . Years of education: Not on file  . Highest education level: 12th grade  Occupational History  . Occupation: disabled  Tobacco Use  . Smoking status: Current Every Day Smoker    Packs/day: 1.00    Years: 35.00    Pack years: 35.00    Types: Cigarettes  . Smokeless tobacco: Never Used  . Tobacco comment: 18 cigarettes daily 02/15/20 [facility]   Vaping Use  . Vaping Use: Never used  Substance and Sexual Activity  . Alcohol use: No  . Drug use: No  . Sexual activity: Not Currently  Other Topics Concern  . Not on file  Social History Narrative  . Not on file   Social Determinants of Health   Financial Resource Strain: Not on file  Food Insecurity: Not on file  Transportation Needs: Not on file  Physical Activity: Not on file  Stress: Not on file  Social Connections: Not on file     Family History: The patient's family history includes Alcohol abuse in her paternal uncle; Arthritis in her father; Brain cancer in her father; Crohn's disease in her mother; Diabetes in her father and paternal grandmother; Heart disease in her mother; Heart failure in her brother and sister; Hypertension in her mother; Kidney cancer in her  brother; Kidney disease in her mother and sister; Other in her sister; Stroke in her paternal grandmother; Ulcerative colitis in her mother.  ROS:   Please see the history of present illness.     All other systems reviewed and are negative.  EKGs/Labs/Other Studies Reviewed:    The following studies were reviewed today: CT scan, prior office notes  EKG: Sinus bradycardia 50s with T wave inversions noted V1 through V3.  Recent Labs: 06/21/2020: ALT 21; BUN 8; Creatinine, Ser 0.94; Hemoglobin 15.4; Magnesium 2.0; Platelets 250.0; Potassium 3.8; Sodium 142; TSH 1.89  Recent Lipid Panel    Component Value Date/Time  CHOL 120 06/21/2020 1012   TRIG 90.0 06/21/2020 1012   HDL 43.30 06/21/2020 1012   CHOLHDL 3 06/21/2020 1012   VLDL 18.0 06/21/2020 1012   LDLCALC 58 06/21/2020 1012     Risk Assessment/Calculations:      Physical Exam:    VS:  BP 140/90 (BP Location: Left Arm, Patient Position: Sitting, Cuff Size: Large)   Pulse 66   Ht 5' (1.524 m)   Wt 242 lb (109.8 kg)   LMP 05/14/1988   SpO2 99%   BMI 47.26 kg/m     Wt Readings from Last 3 Encounters:  07/15/20 242 lb (109.8 kg)  06/21/20 238 lb (108 kg)  02/15/20 237 lb 9.6 oz (107.8 kg)     GEN:  Well nourished, well developed in no acute distress HEENT: Normal NECK: No JVD; No carotid bruits LYMPHATICS: No lymphadenopathy CARDIAC: RRR, no murmurs, rubs, gallops RESPIRATORY:  Clear to auscultation without rales, wheezing or rhonchi  ABDOMEN: Soft, non-tender, non-distended MUSCULOSKELETAL:  No edema; No deformity  SKIN: Warm and dry NEUROLOGIC:  Alert and oriented x 3 PSYCHIATRIC:  Normal affect   ASSESSMENT:    1. Coronary artery calcification   2. Other fatigue   3. Bradycardia   4. Nonspecific abnormal electrocardiogram (ECG) (EKG)   5. Hyperlipidemia, unspecified hyperlipidemia type    PLAN:    In order of problems listed above:  Bradycardia -With heart rates in the 50s, this should not cause  any significant clinical consequence. -She is on Bystolic, a beta-blocker which can reduce her heart rate.  This is also good for her blood pressure however.  Coronary artery disease/coronary artery calcification -Showed her the lung CT and coronary artery calcification as well as aortic atherosclerosis. -Recommend high intensity statin therapy as well as aspirin 81 mg in the situation.  Watch for any signs of bleeding. -We will go ahead and start her on Crestor 10 mg.  See how she does with the medication.  This is despite her LDL cholesterol of 58.  This is because she has clear evidence of aortic atherosclerosis as well as coronary atherosclerosis despite her low cholesterol numbers.  We are trying to stabilize plaque.  Discussed at length with her.  Abnormal EKG -T wave inversions noted in V2 and V3.   -Nuclear stress test in 2019 was overall unremarkable.  Reassuring.  No evidence of ischemia.  Lack of energy fatigue occasional dizziness -Continue to encourage conditioning efforts exercise.     Medication Adjustments/Labs and Tests Ordered: Current medicines are reviewed at length with the patient today.  Concerns regarding medicines are outlined above.  Orders Placed This Encounter  Procedures  . Lipid panel  . ALT   Meds ordered this encounter  Medications  . rosuvastatin (CRESTOR) 10 MG tablet    Sig: Take 1 tablet (10 mg total) by mouth daily.    Dispense:  90 tablet    Refill:  3  . aspirin EC 81 MG tablet    Sig: Take 1 tablet (81 mg total) by mouth daily. Swallow whole.    Dispense:  90 tablet    Refill:  3    Patient Instructions  Medication Instructions:  Start Aspirin 81 mg daily Crestor 3m daily *If you need a refill on your cardiac medications before your next appointment, please call your pharmacy*   Lab Work: None Lipid, ALT in 3 months  If you have labs (blood work) drawn today and your tests are completely normal, you will receive  your results only  by: Marland Kitchen MyChart Message (if you have MyChart) OR . A paper copy in the mail If you have any lab test that is abnormal or we need to change your treatment, we will call you to review the results.   Follow-Up: At [facility], you and your health needs are our priority.  As part of our continuing mission to provide you with exceptional heart care, we have created designated Provider Care Teams.  These Care Teams include your primary Cardiologist (physician) and Advanced Practice Providers (APPs -  Physician Assistants and Nurse Practitioners) who all work together to provide you with the care you need, when you need it.  We recommend signing up for the patient portal called "MyChart".  Sign up information is provided on this After Visit Summary.  MyChart is used to connect with patients for Virtual Visits (Telemedicine).  Patients are able to view lab/test results, encounter notes, upcoming appointments, etc.  Non-urgent messages can be sent to your provider as well.   To learn more about what you can do with MyChart, go to [email or web address].    Your next appointment:   3 month(s)  The format for your next appointment:   In Person  Provider:   You may see Candee Furbish, MD or one of the following Advanced Practice Providers on your designated Care Team:    Kathyrn Drown, NP         Signed, Candee Furbish, MD  07/15/2020 11:55 AM    [place]

## 2020-07-20 ENCOUNTER — Telehealth: Payer: Self-pay | Admitting: Pharmacist

## 2020-07-20 ENCOUNTER — Other Ambulatory Visit: Payer: Self-pay | Admitting: Internal Medicine

## 2020-07-20 DIAGNOSIS — F09 Unspecified mental disorder due to known physiological condition: Secondary | ICD-10-CM

## 2020-07-20 DIAGNOSIS — I10 Essential (primary) hypertension: Secondary | ICD-10-CM

## 2020-07-20 MED ORDER — BUPROPION HCL ER (SR) 150 MG PO TB12: 150.0000 mg | ORAL_TABLET | Freq: Two times a day (BID) | ORAL | 1 refills | Status: DC

## 2020-07-20 MED ORDER — AMLODIPINE BESYLATE 10 MG PO TABS: 10.0000 mg | ORAL_TABLET | Freq: Every day | ORAL | 1 refills | Status: DC

## 2020-07-20 NOTE — Telephone Encounter (Signed)
Received call from patient regarding prescriptions on hold at mail order pharmacy.  Patient reports she received an email from her mail order pharmacy that several prescriptions are "on hold" until the physician authorizes them. Patient does not know specifically which prescriptions they are referring to, she reports "all of them". Suggested patient contact mail order pharmacy to clarify.  It sounds like patient needs refills authorized, per chart medications needing refills soon include:  Bupropion SR 150 mg Amlodipine 10 mg  Will coordinate refills with PCP.

## 2020-07-22 ENCOUNTER — Other Ambulatory Visit: Payer: Self-pay

## 2020-07-22 ENCOUNTER — Ambulatory Visit (INDEPENDENT_AMBULATORY_CARE_PROVIDER_SITE_OTHER): Payer: Medicare Other

## 2020-07-22 DIAGNOSIS — E538 Deficiency of other specified B group vitamins: Secondary | ICD-10-CM | POA: Diagnosis not present

## 2020-07-22 MED ORDER — CYANOCOBALAMIN 1000 MCG/ML IJ SOLN
1000.0000 ug | Freq: Once | INTRAMUSCULAR | Status: AC
  Administered 2020-07-22: 1000 ug via INTRAMUSCULAR

## 2020-07-22 NOTE — Progress Notes (Signed)
Pt here for monthly B12 injection per Dr. Ronnald Ramp. B12 1073mg given in Right IM and patient tolerated injection well.

## 2020-07-22 NOTE — Progress Notes (Signed)
I have reviewed and agree.

## 2020-07-27 ENCOUNTER — Encounter: Payer: Self-pay | Admitting: Neurology

## 2020-07-27 ENCOUNTER — Encounter: Payer: Self-pay | Admitting: Internal Medicine

## 2020-08-03 ENCOUNTER — Telehealth: Payer: Self-pay | Admitting: *Deleted

## 2020-08-03 DIAGNOSIS — Z789 Other specified health status: Secondary | ICD-10-CM

## 2020-08-03 DIAGNOSIS — E785 Hyperlipidemia, unspecified: Secondary | ICD-10-CM

## 2020-08-03 NOTE — Telephone Encounter (Signed)
Called pt to discuss - call was sent straight to VM.  Left message that she was referred to the [facility] to keep her cholesterol levels under control to prevent any further coronary calcium or CAD.  This referral comes d/t pt being intolerant of statin medications. Requested she call back with any further questions/concerns.

## 2020-08-03 NOTE — Telephone Encounter (Signed)
Spoke to pt. Tried to clarify rationale/reasoning of why Crestor was recommended.  Pt more understanding after clarification (CT/atherosclerosis). She is agreeable to lipid clinic and will await call to schedule.

## 2020-08-03 NOTE — Telephone Encounter (Signed)
Patient is requesting to speak with Dr. Marlou Porch' nurse regarding the lipid clinic referral.  She states she does not understand why she is being referred to a lipid clinic because she does not have issues with her cholesterol. She requested a voice message if she is unavailable when her call is returned.

## 2020-08-03 NOTE — Telephone Encounter (Signed)
Jerline Pain, MD  You 20 minutes ago (4:05 PM)     Thank you for letting me know. I will refer you to the lipid clinic here for more options.  Candee Furbish, MD   Message text    You routed conversation to Jerline Pain, MD 5 days ago   Antonieta Iba, RN routed conversation to [facility] 5 days ago   [place], Patricia Nettle, MD 5 days ago      Hi Doctor Gloriann Loan this is April May and I did start the Crestor but had muscle pain on my right shoulder up and down my arm. I may need to try something else and Lipitor  is also a allergic medication. I won't be able to continue with either of those. If you can send something else equivalent to it also with less side effects I would certainly appreciate it.          Thank you    Order placed for pt to be seen in [facility].

## 2020-08-04 ENCOUNTER — Other Ambulatory Visit: Payer: Self-pay | Admitting: Internal Medicine

## 2020-08-04 DIAGNOSIS — T502X5A Adverse effect of carbonic-anhydrase inhibitors, benzothiadiazides and other diuretics, initial encounter: Secondary | ICD-10-CM

## 2020-08-04 DIAGNOSIS — I10 Essential (primary) hypertension: Secondary | ICD-10-CM

## 2020-08-04 DIAGNOSIS — E876 Hypokalemia: Secondary | ICD-10-CM

## 2020-08-07 ENCOUNTER — Other Ambulatory Visit: Payer: Self-pay | Admitting: Internal Medicine

## 2020-08-07 DIAGNOSIS — F321 Major depressive disorder, single episode, moderate: Secondary | ICD-10-CM

## 2020-08-08 DIAGNOSIS — G4733 Obstructive sleep apnea (adult) (pediatric): Secondary | ICD-10-CM | POA: Diagnosis not present

## 2020-08-10 ENCOUNTER — Ambulatory Visit: Payer: Self-pay | Admitting: Neurology

## 2020-08-12 ENCOUNTER — Telehealth: Payer: Self-pay | Admitting: Cardiology

## 2020-08-12 ENCOUNTER — Other Ambulatory Visit: Payer: Self-pay | Admitting: Internal Medicine

## 2020-08-12 ENCOUNTER — Encounter: Payer: Self-pay | Admitting: Internal Medicine

## 2020-08-12 DIAGNOSIS — E785 Hyperlipidemia, unspecified: Secondary | ICD-10-CM

## 2020-08-12 MED ORDER — LIVALO 2 MG PO TABS: 1.0000 | ORAL_TABLET | Freq: Every day | ORAL | 1 refills | Status: DC

## 2020-08-12 NOTE — Telephone Encounter (Signed)
Spoke with pt and advised Dr Marlou Porch does not want to try a different medication for her at this time.  He would like for her to be evaluated for the most appropriate treatment through the [facility].  Pt states understanding but doesn't feel like she can afford another co-payment to be seen by another provider for this.  She also doesn't want to be started on "any type of shot" for her cholesterol either.  Advised pt to work on cleaning up her diet and exercising.  Instructed her to keep her appt with Dr Marlou Porch as scheduled 10/12/2020.  Pt states understanding and thanked me for the call back/information.  She is also going to contact her PCP as well.

## 2020-08-12 NOTE — Telephone Encounter (Signed)
Patient called in regards to referral to lipid clinic. She states she does not want to schedule at this time. She would like to know if there is something Dr. Marlou Porch can prescribe her instead.

## 2020-08-23 ENCOUNTER — Encounter: Payer: Self-pay | Admitting: Neurology

## 2020-08-23 ENCOUNTER — Other Ambulatory Visit: Payer: Self-pay

## 2020-08-24 ENCOUNTER — Ambulatory Visit (INDEPENDENT_AMBULATORY_CARE_PROVIDER_SITE_OTHER): Payer: Medicare Other

## 2020-08-24 DIAGNOSIS — E538 Deficiency of other specified B group vitamins: Secondary | ICD-10-CM | POA: Diagnosis not present

## 2020-08-24 MED ORDER — CYANOCOBALAMIN 1000 MCG/ML IJ SOLN: 1000.0000 ug | INTRAMUSCULAR | Status: DC

## 2020-08-24 MED ORDER — CYANOCOBALAMIN 1000 MCG/ML IJ SOLN
1000.0000 ug | Freq: Once | INTRAMUSCULAR | Status: AC
  Administered 2020-08-24: 1000 ug via INTRAMUSCULAR

## 2020-08-24 NOTE — Progress Notes (Signed)
Pt here for monthly B12 injection per Dr. Ronnald Ramp  B12 1040mg given  Right deltoid IM, and pt tolerated injection well.  Next B12 injection scheduled for 09/20/20.

## 2020-08-30 ENCOUNTER — Other Ambulatory Visit: Payer: Self-pay

## 2020-08-30 MED ORDER — BUDESONIDE 3 MG PO CPEP: 9.0000 mg | ORAL_CAPSULE | Freq: Every day | ORAL | 0 refills | Status: DC

## 2020-09-02 ENCOUNTER — Other Ambulatory Visit: Payer: Self-pay | Admitting: Internal Medicine

## 2020-09-02 DIAGNOSIS — E559 Vitamin D deficiency, unspecified: Secondary | ICD-10-CM

## 2020-09-05 ENCOUNTER — Other Ambulatory Visit: Payer: Self-pay | Admitting: Internal Medicine

## 2020-09-05 ENCOUNTER — Other Ambulatory Visit: Payer: Self-pay | Admitting: *Deleted

## 2020-09-05 ENCOUNTER — Telehealth: Payer: Self-pay | Admitting: Cardiology

## 2020-09-05 DIAGNOSIS — E876 Hypokalemia: Secondary | ICD-10-CM

## 2020-09-05 DIAGNOSIS — E785 Hyperlipidemia, unspecified: Secondary | ICD-10-CM

## 2020-09-05 DIAGNOSIS — Z789 Other specified health status: Secondary | ICD-10-CM

## 2020-09-05 DIAGNOSIS — I10 Essential (primary) hypertension: Secondary | ICD-10-CM

## 2020-09-05 DIAGNOSIS — T502X5A Adverse effect of carbonic-anhydrase inhibitors, benzothiadiazides and other diuretics, initial encounter: Secondary | ICD-10-CM

## 2020-09-05 MED ORDER — POTASSIUM CITRATE ER 15 MEQ (1620 MG) PO TBCR: 1.0000 | EXTENDED_RELEASE_TABLET | Freq: Three times a day (TID) | ORAL | 0 refills | Status: DC

## 2020-09-05 NOTE — Telephone Encounter (Signed)
Spoke with patient again about the need for lipid clinic referral.  Advised she is being referred for lipid management as standard of care for plaque stabilization in known coronary artery disease.  She reports not understanding because her cholesterol numbers are good.  Again reviewed need for plaque stabilization and prevention.  Advised the recommendation is for her to be seen in the Lipid Clinic to discuss best treatment options as she is intolerant of statin.  She reports she is unsure whether she wants further treatment of not.  All questions answered to the best of my ability at the time if the call.   She reports having some questions for her PCP and that she will contact that office.  She was appreciative of the call and information.

## 2020-09-05 NOTE — Telephone Encounter (Signed)
From 07/15/2020 office note from Dr Marlou Porch:  Coronary artery disease/coronary artery calcification -Showed her the lung CT and coronary artery calcification as well as aortic atherosclerosis. -Recommend high intensity statin therapy as well as aspirin 81 mg in the situation.  Watch for any signs of bleeding. -We will go ahead and start her on Crestor 10 mg.  See how she does with the medication.  This is despite her LDL cholesterol of 58.  This is because she has clear evidence of aortic atherosclerosis as well as coronary atherosclerosis despite her low cholesterol numbers.  We are trying to stabilize plaque.  Discussed at length with her.

## 2020-09-05 NOTE — Telephone Encounter (Signed)
Hey good morning! I spoke with April May to try to get her scheduled with an appt for the [facility], she stated she was aware of the referral but wasn't aware of why she needed it as "her cholesterol wasn't bad". Will you please give her a call today to discuss this in better detail? She would really appreciate it, I will call her tomorrow to try and schedule per her request.

## 2020-09-08 DIAGNOSIS — G4733 Obstructive sleep apnea (adult) (pediatric): Secondary | ICD-10-CM | POA: Diagnosis not present

## 2020-09-15 ENCOUNTER — Other Ambulatory Visit: Payer: Self-pay | Admitting: Internal Medicine

## 2020-09-15 DIAGNOSIS — F09 Unspecified mental disorder due to known physiological condition: Secondary | ICD-10-CM

## 2020-09-20 ENCOUNTER — Encounter: Payer: Self-pay | Admitting: Internal Medicine

## 2020-09-20 ENCOUNTER — Other Ambulatory Visit: Payer: Self-pay

## 2020-09-20 ENCOUNTER — Ambulatory Visit (INDEPENDENT_AMBULATORY_CARE_PROVIDER_SITE_OTHER): Payer: Medicare Other | Admitting: Internal Medicine

## 2020-09-20 VITALS — BP 154/98 | HR 60 | Temp 98.1°F | Ht 60.0 in | Wt 239.0 lb

## 2020-09-20 DIAGNOSIS — E876 Hypokalemia: Secondary | ICD-10-CM | POA: Diagnosis not present

## 2020-09-20 DIAGNOSIS — T502X5A Adverse effect of carbonic-anhydrase inhibitors, benzothiadiazides and other diuretics, initial encounter: Secondary | ICD-10-CM | POA: Diagnosis not present

## 2020-09-20 DIAGNOSIS — I10 Essential (primary) hypertension: Secondary | ICD-10-CM | POA: Diagnosis not present

## 2020-09-20 DIAGNOSIS — E538 Deficiency of other specified B group vitamins: Secondary | ICD-10-CM

## 2020-09-20 LAB — HEPATIC FUNCTION PANEL
ALT: 21 U/L (ref 0–35)
AST: 15 U/L (ref 0–37)
Albumin: 4.2 g/dL (ref 3.5–5.2)
Alkaline Phosphatase: 45 U/L (ref 39–117)
Bilirubin, Direct: 0.2 mg/dL (ref 0.0–0.3)
Total Bilirubin: 0.9 mg/dL (ref 0.2–1.2)
Total Protein: 7 g/dL (ref 6.0–8.3)

## 2020-09-20 LAB — URINALYSIS, ROUTINE W REFLEX MICROSCOPIC
Bilirubin Urine: NEGATIVE
Hgb urine dipstick: NEGATIVE
Ketones, ur: NEGATIVE
Leukocytes,Ua: NEGATIVE
Nitrite: NEGATIVE
RBC / HPF: NONE SEEN (ref 0–?)
Specific Gravity, Urine: <=1.005 — AB (ref 1.000–1.030)
Total Protein, Urine: NEGATIVE
Urine Glucose: NEGATIVE
Urobilinogen, UA: 0.2 (ref 0.0–1.0)
WBC, UA: NONE SEEN (ref 0–?)
pH: 6.5 (ref 5.0–8.0)

## 2020-09-20 LAB — BASIC METABOLIC PANEL
BUN: 8 mg/dL (ref 6–23)
CO2: 36 mEq/L — ABNORMAL HIGH (ref 19–32)
Calcium: 9.8 mg/dL (ref 8.4–10.5)
Chloride: 100 mEq/L (ref 96–112)
Creatinine, Ser: 0.88 mg/dL (ref 0.40–1.20)
GFR: 69.22 mL/min (ref >60.00)
Glucose, Bld: 92 mg/dL (ref 70–99)
Potassium: 3.2 mEq/L — ABNORMAL LOW (ref 3.5–5.1)
Sodium: 144 mEq/L (ref 135–145)

## 2020-09-20 MED ORDER — POTASSIUM CITRATE ER 15 MEQ (1620 MG) PO TBCR
1.0000 | EXTENDED_RELEASE_TABLET | Freq: Three times a day (TID) | ORAL | 0 refills | Status: DC
Start: 2020-09-20 — End: 2021-01-22

## 2020-09-20 MED ORDER — CYANOCOBALAMIN 1000 MCG/ML IJ SOLN
1000.0000 ug | INTRAMUSCULAR | Status: DC
  Administered 2020-09-20 – 2020-10-21 (×2): 1000 ug via INTRAMUSCULAR

## 2020-09-20 MED ORDER — HYDRALAZINE HCL 25 MG PO TABS: 25.0000 mg | ORAL_TABLET | Freq: Three times a day (TID) | ORAL | 0 refills | Status: DC

## 2020-09-20 MED ORDER — SPIRONOLACTONE 100 MG PO TABS: 100.0000 mg | ORAL_TABLET | Freq: Every day | ORAL | 0 refills | Status: DC

## 2020-09-20 NOTE — Patient Instructions (Signed)

## 2020-09-20 NOTE — Progress Notes (Signed)
Subjective:  Patient ID: April May, female    DOB: Sep 18, 1955  Age: 65 y.o. MRN: [id number]  CC: Hypertension  This visit occurred during the SARS-CoV-2 public health emergency.  Safety protocols were in place, including screening questions prior to the visit, additional usage of staff PPE, and extensive cleaning of exam room while observing appropriate contact time as indicated for disinfecting solutions.    HPI April May presents for f/up -   According to prescription refills she has no longer taking spironolactone.  She has not been monitoring her blood pressure.  She is active and denies CP, DOE, palpitations, or edema.  Outpatient Medications Prior to Visit  Medication Sig Dispense Refill  . amLODipine (NORVASC) 10 MG tablet Take 1 tablet (10 mg total) by mouth daily. 90 tablet 1  . aspirin EC 81 MG tablet Take 1 tablet (81 mg total) by mouth daily. Swallow whole. 90 tablet 3  . budesonide (ENTOCORT EC) 3 MG 24 hr capsule Take 3 capsules (9 mg total) by mouth daily. 270 capsule 0  . buPROPion (WELLBUTRIN SR) 150 MG 12 hr tablet Take 1 tablet (150 mg total) by mouth 2 (two) times daily. 180 tablet 1  . Cholecalciferol (VITAMIN D3) 1.25 MG (50000 UT) CAPS TAKE 1 CAPSULE BY MOUTH ONE TIME PER WEEK 12 capsule 0  . clonazePAM (KLONOPIN) 0.5 MG tablet Take 1 tablet (0.5 mg total) by mouth 2 (two) times daily as needed for anxiety. 60 tablet 2  . [employer] PAIN RELIEF EXTRA STRENGTH 500 MG tablet TAKE 1 TABLET BY MOUTH EVERY 8 HOURS AS NEEDED FOR MODERATE PAIN 60 tablet 5  . fluticasone (FLONASE) 50 MCG/ACT nasal spray Place 2 sprays into both nostrils daily. 48 g 3  . lamoTRIgine (LAMICTAL) 200 MG tablet TAKE 1 TABLET BY MOUTH AT  BEDTIME 90 tablet 1  . mupirocin ointment (BACTROBAN) 2 % SMARTSIG:1 Application Topical 2-3 Times Daily    . nebivolol (BYSTOLIC) 10 MG tablet TAKE 1 TABLET BY MOUTH  DAILY 90 tablet 0  . nicotine (NICODERM CQ) 14 mg/24hr patch Place 1  patch (14 mg total) onto the skin daily. 28 patch 0  . PROAIR HFA 108 (90 Base) MCG/ACT inhaler TAKE 2 PUFFS BY MOUTH EVERY 6 HOURS AS NEEDED FOR WHEEZE OR SHORTNESS OF BREATH 18 g 5  . Tiotropium Bromide-Olodaterol (STIOLTO RESPIMAT) 2.5-2.5 MCG/ACT AERS Inhale 2 puffs into the lungs daily. 12 g 1  . MYRBETRIQ 25 MG TB24 tablet TAKE 1 TABLET BY MOUTH  DAILY 90 tablet 0  . Pitavastatin Calcium (LIVALO) 2 MG TABS Take 1 tablet (2 mg total) by mouth daily. 90 tablet 1  . Potassium Citrate 15 MEQ (1620 MG) TBCR Take 1 tablet by mouth in the morning, at noon, and at bedtime. 270 tablet 0  . spironolactone (ALDACTONE) 100 MG tablet Take 1 tablet (100 mg total) by mouth daily. 90 tablet 0   No facility-administered medications prior to visit.    ROS Review of Systems  Constitutional: Negative for diaphoresis and fatigue.  HENT: Negative.   Eyes: Negative.   Respiratory: Negative for cough, chest tightness, shortness of breath and wheezing.   Cardiovascular: Negative for chest pain, palpitations and leg swelling.  Gastrointestinal: Negative for abdominal pain, diarrhea, nausea and vomiting.  Genitourinary: Negative.  Negative for difficulty urinating.  Musculoskeletal: Negative.  Negative for arthralgias.  Skin: Negative.   Neurological: Negative.  Negative for dizziness, weakness and light-headedness.  Hematological: Negative for adenopathy. Does not bruise/bleed  easily.  Psychiatric/Behavioral: Negative.     Objective:  BP (!) 154/98 (BP Location: Right Arm, Patient Position: Sitting, Cuff Size: Large)   Pulse 60   Temp 98.1 F (36.7 C) (Oral)   Ht 5' (1.524 m)   Wt 239 lb (108.4 kg)   LMP 05/14/1988   SpO2 98%   BMI 46.68 kg/m   BP Readings from Last 3 Encounters:  09/20/20 (!) 154/98  07/15/20 140/90  06/21/20 138/86    Wt Readings from Last 3 Encounters:  09/20/20 239 lb (108.4 kg)  07/15/20 242 lb (109.8 kg)  06/21/20 238 lb (108 kg)    Physical Exam Vitals  reviewed.  HENT:     Nose: Nose normal.     Mouth/Throat:     Mouth: Mucous membranes are moist.  Eyes:     General: No scleral icterus.    Conjunctiva/sclera: Conjunctivae normal.  Cardiovascular:     Rate and Rhythm: Normal rate and regular rhythm.     Heart sounds: No murmur heard.   Pulmonary:     Effort: Pulmonary effort is normal.     Breath sounds: No stridor. No wheezing, rhonchi or rales.  Abdominal:     General: Abdomen is protuberant. Bowel sounds are normal. There is no distension.     Palpations: Abdomen is soft. There is no hepatomegaly or splenomegaly.     Tenderness: There is no abdominal tenderness.  Musculoskeletal:        General: Normal range of motion.     Cervical back: Neck supple.     Right lower leg: No edema.     Left lower leg: No edema.  Lymphadenopathy:     Cervical: No cervical adenopathy.  Skin:    General: Skin is warm and dry.  Neurological:     General: No focal deficit present.     Mental Status: She is alert.     Lab Results  Component Value Date   WBC 11.8 (H) 06/21/2020   HGB 15.4 (H) 06/21/2020   HCT 47.0 (H) 06/21/2020   PLT 250.0 06/21/2020   GLUCOSE 92 09/20/2020   CHOL 120 06/21/2020   TRIG 90.0 06/21/2020   HDL 43.30 06/21/2020   LDLCALC 58 06/21/2020   ALT 21 09/20/2020   AST 15 09/20/2020   NA 144 09/20/2020   K 3.2 (L) 09/20/2020   CL 100 09/20/2020   CREATININE 0.88 09/20/2020   BUN 8 09/20/2020   CO2 36 (H) 09/20/2020   TSH 1.89 06/21/2020   HGBA1C 5.7 06/21/2020   MICROALBUR <0.7 06/10/2019    CT CHEST LUNG CA SCREEN LOW DOSE W/O CM  Result Date: 02/03/2020 CLINICAL DATA:  Lung cancer screening. Thirty-four pack-year history. Current asymptomatic smoker. EXAM: CT CHEST WITHOUT CONTRAST LOW-DOSE FOR LUNG CANCER SCREENING TECHNIQUE: Multidetector CT imaging of the chest was performed following the standard protocol without IV contrast. COMPARISON:  None. FINDINGS: Cardiovascular: Heart size is normal. No  pericardial effusion. Aortic atherosclerosis. Coronary artery atherosclerotic calcifications noted. Mediastinum/Nodes: No enlarged mediastinal, hilar, or axillary lymph nodes. Thyroid gland, trachea, and esophagus demonstrate no significant findings. Lungs/Pleura: Centrilobular emphysema. No pleural effusion, airspace consolidation or atelectasis. Scar noted within the right middle lobe. Single tiny lung nodule in the anterolateral right lower lobe has an equivalent diameter 1.9 mm. Upper Abdomen: No acute abnormality identified within the imaged portions of the upper abdomen. Hepatic steatosis noted. Musculoskeletal: No acute abnormality.  Thoracic spondylosis noted. IMPRESSION: 1. Lung-RADS 2, benign appearance or behavior. Continue annual screening  with low-dose chest CT without contrast in 12 months. 2. Coronary artery calcifications noted. 3. Hepatic steatosis. Aortic Atherosclerosis (ICD10-I70.0) and Emphysema (ICD10-J43.9). Electronically Signed   By: Kerby Moors M.D.   On: 02/03/2020 13:35    Assessment & Plan:   April May was seen today for hypertension.  Diagnoses and all orders for this visit:  Vitamin B12 deficiency -     cyanocobalamin ((VITAMIN B-12)) injection 1,000 mcg  Malignant hypertension- She has not achieved her blood pressure goal and her potassium level remains low.  I have asked her to restart spironolactone.  She will stay on the other antihypertensives.  Will add hydralazine. -     Basic metabolic panel; Future -     Urinalysis, Routine w reflex microscopic; Future -     hydrALAZINE (APRESOLINE) 25 MG tablet; Take 1 tablet (25 mg total) by mouth 3 (three) times daily. -     Hepatic function panel; Future -     Hepatic function panel -     Urinalysis, Routine w reflex microscopic -     Basic metabolic panel -     Potassium Citrate 15 MEQ (1620 MG) TBCR; Take 1 tablet by mouth in the morning, at noon, and at bedtime. -     spironolactone (ALDACTONE) 100 MG tablet; Take  1 tablet (100 mg total) by mouth daily.  Diuretic-induced hypokalemia -     Basic metabolic panel; Future -     Basic metabolic panel -     Potassium Citrate 15 MEQ (1620 MG) TBCR; Take 1 tablet by mouth in the morning, at noon, and at bedtime. -     spironolactone (ALDACTONE) 100 MG tablet; Take 1 tablet (100 mg total) by mouth daily.   I have discontinued Deloria Lair. Christman's Myrbetriq and Livalo. I am also having her start on hydrALAZINE. Additionally, I am having her maintain her CVS Pain Relief Extra Strength, ProAir HFA, mupirocin ointment, fluticasone, Stiolto Respimat, nicotine, clonazePAM, nebivolol, aspirin EC, amLODipine, buPROPion, lamoTRIgine, budesonide, Vitamin D3, Potassium Citrate, and spironolactone. We administered cyanocobalamin. We will continue to administer cyanocobalamin.  Meds ordered this encounter  Medications  . cyanocobalamin ((VITAMIN B-12)) injection 1,000 mcg  . hydrALAZINE (APRESOLINE) 25 MG tablet    Sig: Take 1 tablet (25 mg total) by mouth 3 (three) times daily.    Dispense:  270 tablet    Refill:  0  . Potassium Citrate 15 MEQ (1620 MG) TBCR    Sig: Take 1 tablet by mouth in the morning, at noon, and at bedtime.    Dispense:  270 tablet    Refill:  0  . spironolactone (ALDACTONE) 100 MG tablet    Sig: Take 1 tablet (100 mg total) by mouth daily.    Dispense:  90 tablet    Refill:  0     Follow-up: Return in about 3 months (around 12/21/2020).  Scarlette Calico, MD

## 2020-09-22 ENCOUNTER — Other Ambulatory Visit: Payer: Self-pay

## 2020-09-22 ENCOUNTER — Ambulatory Visit (INDEPENDENT_AMBULATORY_CARE_PROVIDER_SITE_OTHER): Payer: Medicare Other | Admitting: Pharmacist

## 2020-09-22 DIAGNOSIS — E782 Mixed hyperlipidemia: Secondary | ICD-10-CM

## 2020-09-22 DIAGNOSIS — G720 Drug-induced myopathy: Secondary | ICD-10-CM | POA: Diagnosis not present

## 2020-09-22 DIAGNOSIS — F17200 Nicotine dependence, unspecified, uncomplicated: Secondary | ICD-10-CM

## 2020-09-22 DIAGNOSIS — T466X5A Adverse effect of antihyperlipidemic and antiarteriosclerotic drugs, initial encounter: Secondary | ICD-10-CM | POA: Diagnosis not present

## 2020-09-22 MED ORDER — EZETIMIBE 10 MG PO TABS: 10.0000 mg | ORAL_TABLET | Freq: Every day | ORAL | 3 refills | Status: DC

## 2020-09-22 MED ORDER — NICOTINE 21 MG/24HR TD PT24: 21.0000 mg | MEDICATED_PATCH | Freq: Every day | TRANSDERMAL | 3 refills | Status: DC

## 2020-09-22 MED ORDER — NICOTINE POLACRILEX 4 MG MT GUM: 4.0000 mg | CHEWING_GUM | OROMUCOSAL | 3 refills | Status: DC | PRN

## 2020-09-22 NOTE — Progress Notes (Signed)
Patient ID: April May                 DOB: 18-Aug-1955                    MRN: [id number]     HPI: April May is a 65 y.o. female patient referred to lipid clinic by Dr Marlou Porch. PMH is significant for coronary artery calcifications noted on lung cancer CT screening 02/03/20, bradycardia, pre-DM, HTN, depression/anxiety, COPD, tobacco abuse, and Crohn's disease. At first visit with Dr Marlou Porch on 07/15/20, pt reported chronic weakness, dizziness, fatigue, and insomnia. She was started on rosuvastatin 84m daily. She sent in MyChart message 2 weeks later with reports of muscle pain in her right shoulder and arm. Rosuvastatin was discontinued and pt was referred to lipid clinic. She initially did not want to schedule - looks as though need for lipid lowering therapy has been discussed with pt on 3 separate occasions (extensive discussion at office visit with Dr SMarlou Porch 3/23 phone note, and 4/25 phone note when scheduler tried to schedule lipid clinic appt). Pt was unsure whether or not she wanted to be seen.  Pt previously took atorvastatin which caused muscle aches. After she tried rosuvastatin recently, her PCP tried her on Livalo which she has also stopped due to muscle pain. All statins caused aches after 2-3 days and resolved with statin discontinuation. She was initially opposed to injectable cholesterol medication but after intolerance to 3rd statin is now willing to discuss.  Pt still smoking 1 PPD. Has quit for about a week before but then starts back up again when she is stressed. Already takes Wellbutrin for mood. Didn't want to try Chantix due to potential for neuropsych side effects. Nicotine patch sent to pharmacy last year but insurance didn't cover it well.  Current Medications: none  Intolerances:  atorvastatin 255mdaily - prescribed 08/15/11 - 08/24/11 rosuvastatin 1016maily - prescribed 06/13/11-08/15/11 and 07/15/20-08/03/20 Livalo 2mg68mily - prescribed 08/12/20 -  09/20/20 muscle pain with all  Risk Factors: coronary artery calcifications, pre-DM, HTN, tobacco abuse, fam hx of CAD  LDL goal: <40mg39m- aggressive goal due to coronary atherosclerosis despite baseline LDL 40-50s  Diet: Doesn't eat set meals. Likes chicken, potatoes, beans. A lot of food with her Crohn's cause issues.  Exercise: Goes to the [employer].  Family History: Alcohol abuse in her paternal uncle; Arthritis in her father; Brain cancer in her father; Crohn's disease in her mother; Diabetes in her father and paternal grandmother; Heart disease in her mother; Heart failure in her brother and sister; Hypertension in her mother; Kidney cancer in her brother; Kidney disease in her mother and sister; Other in her sister; Stroke in her paternal grandmother; Ulcerative colitis in her mother.  Social History: Smokes 1 PPD for 35 years, denies alcohol and drug use.  Labs: 06/21/20: TC 120, TG 90, HDL 43.3, LDL 58 (no lipid lowering therapy)  Past Medical History:  Diagnosis Date  . Allergy   . Anxiety   . Benign breast lumps   . COPD (chronic obstructive pulmonary disease) (HCC) [place] Crohn disease (HCC) [place] Depression   . Diabetes mellitus type II    borderline, md checks hemaglobin A 1 c at md office   . Hypertension   . Sleep apnea    does not use cpap since weight loss 1 and 1/2 years ago  . Small bowel obstruction (HCC) Chical6  . 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 Vitamin D deficiency     Current Outpatient Medications on File Prior to Visit  Medication Sig Dispense Refill  . amLODipine (NORVASC) 10 MG tablet Take 1 tablet (10 mg total) by mouth daily. 90 tablet 1  . aspirin EC 81 MG tablet Take 1 tablet (81 mg total) by mouth daily. Swallow whole. 90 tablet 3  . budesonide (ENTOCORT EC) 3 MG 24 hr capsule Take 3 capsules (9 mg total) by mouth daily. 270 capsule 0  . buPROPion (WELLBUTRIN SR) 150 MG 12 hr tablet Take 1 tablet (150 mg total) by mouth 2 (two) times  daily. 180 tablet 1  . Cholecalciferol (VITAMIN D3) 1.25 MG (50000 UT) CAPS TAKE 1 CAPSULE BY MOUTH ONE TIME PER WEEK 12 capsule 0  . clonazePAM (KLONOPIN) 0.5 MG tablet Take 1 tablet (0.5 mg total) by mouth 2 (two) times daily as needed for anxiety. 60 tablet 2  . [employer] PAIN RELIEF EXTRA STRENGTH 500 MG tablet TAKE 1 TABLET BY MOUTH EVERY 8 HOURS AS NEEDED FOR MODERATE PAIN 60 tablet 5  . fluticasone (FLONASE) 50 MCG/ACT nasal spray Place 2 sprays into both nostrils daily. 48 g 3  . hydrALAZINE (APRESOLINE) 25 MG tablet Take 1 tablet (25 mg total) by mouth 3 (three) times daily. 270 tablet 0  . lamoTRIgine (LAMICTAL) 200 MG tablet TAKE 1 TABLET BY MOUTH AT  BEDTIME 90 tablet 1  . mupirocin ointment (BACTROBAN) 2 % SMARTSIG:1 Application Topical 2-3 Times Daily    . nebivolol (BYSTOLIC) 10 MG tablet TAKE 1 TABLET BY MOUTH  DAILY 90 tablet 0  . nicotine (NICODERM CQ) 14 mg/24hr patch Place 1 patch (14 mg total) onto the skin daily. 28 patch 0  . Potassium Citrate 15 MEQ (1620 MG) TBCR Take 1 tablet by mouth in the morning, at noon, and at bedtime. 270 tablet 0  . PROAIR HFA 108 (90 Base) MCG/ACT inhaler TAKE 2 PUFFS BY MOUTH EVERY 6 HOURS AS NEEDED FOR WHEEZE OR SHORTNESS OF BREATH 18 g 5  . spironolactone (ALDACTONE) 100 MG tablet Take 1 tablet (100 mg total) by mouth daily. 90 tablet 0  . Tiotropium Bromide-Olodaterol (STIOLTO RESPIMAT) 2.5-2.5 MCG/ACT AERS Inhale 2 puffs into the lungs daily. 12 g 1   Current Facility-Administered Medications on File Prior to Visit  Medication Dose Route Frequency Provider Last Rate Last Admin  . cyanocobalamin ((VITAMIN B-12)) injection 1,000 mcg  1,000 mcg Intramuscular Q30 days Janith Lima, MD   1,000 mcg at 09/20/20 1009    Allergies  Allergen Reactions  . Azathioprine Other (See Comments)    Side effects to pancrease     . Crestor [Rosuvastatin Calcium] Other (See Comments)    Muscle and joint aches  . Lipitor [Atorvastatin Calcium] Other (See  Comments)    Muscle cramps  . Lisinopril Cough    Assessment/Plan:  1. Hyperlipidemia - Extensive conversation regarding pt's cardiovascular risk. She developed coronary artery calcifications despite baseline LDL in the 40-50s. Would be reasonable to target LDL goal < 40. She is intolerant to 3 statins. Insurance will not cover PCSK9i therapy since her LDL is < 70. She is willing to start Zetia 58m daily. Will recheck advanced lipid panel in 3 months to further assess risk by looking at her LDL particle #, ApoB, and Lp(a).  2. Tobacco abuse - discussed that tobacco cessation will help to reduce her cardiovascular risk greatly. Sent in rx for nicotine patch and gum, she can  compare to see if rx or OTC is cheaper. Previously worried about the cost. Discussed that a few months of NRT therapy will be cheaper than continuing to pay for 1 pack of cigarettes per day indefinitely. She will work to cut back.  Larine Fielding E. Tavari Loadholt, PharmD, BCACP, [facility] [place] [address] [place], [place] [address] Phone: [phone]; Fax: [phone] 09/22/2020 11:05 AM

## 2020-09-22 NOTE — Patient Instructions (Addendum)
It was nice to meet you today!  Start ezetimibe (Zetia) 26m - 1 tablet once daily for your cholesterol. This will be coming from [facility] mail order  Recheck fasting labs on Wednesday, August 3rd any time after 7:30am  I sent prescriptions for the nicotine patch and gum to help cut back on smoking. Stopping smoking is one of the best things you can do for your health  Gum: Chew slowly until it tingles, then place gum between cheek and gum until tingle is gone; repeat process until most of tingle is gone (~30 minutes). Do not eat or drink 15 minutes before using or while the gum is in mouth.  Transdermal patch: Apply new patch to nonhairy, clean, dry skin on the upper body or upper outer arm; each patch should be applied to a different site. Apply immediately after removing backing from patch; press onto skin for ~10 seconds. Patch may be worn for 16 or 24 hours. If cigarette cravings occur upon awakening, wear for 24 hours; if vivid dreams or other sleep disturbances occur, remove the patch at bedtime and apply a new patch in the morning. Do not cut patch; causes rapid evaporation, rendering the patch useless. Do not wear more than 1 patch at a time; do not leave patch on for more than 24 hours (may irritate skin). Wash hands after applying or removing patch. Discard patches by folding adhesive ends together, replace in pouch and dispose of properly in trash.

## 2020-09-23 DIAGNOSIS — H52203 Unspecified astigmatism, bilateral: Secondary | ICD-10-CM | POA: Diagnosis not present

## 2020-09-23 DIAGNOSIS — E119 Type 2 diabetes mellitus without complications: Secondary | ICD-10-CM | POA: Diagnosis not present

## 2020-09-23 LAB — HM DIABETES EYE EXAM

## 2020-10-05 ENCOUNTER — Other Ambulatory Visit: Payer: Self-pay | Admitting: Internal Medicine

## 2020-10-05 DIAGNOSIS — I10 Essential (primary) hypertension: Secondary | ICD-10-CM

## 2020-10-06 ENCOUNTER — Ambulatory Visit: Payer: Self-pay | Admitting: Neurology

## 2020-10-08 DIAGNOSIS — G4733 Obstructive sleep apnea (adult) (pediatric): Secondary | ICD-10-CM | POA: Diagnosis not present

## 2020-10-12 ENCOUNTER — Ambulatory Visit: Payer: Medicare Other | Admitting: Cardiology

## 2020-10-12 ENCOUNTER — Encounter: Payer: Self-pay | Admitting: Cardiology

## 2020-10-12 ENCOUNTER — Other Ambulatory Visit: Payer: Self-pay

## 2020-10-12 VITALS — BP 120/70 | HR 78 | Ht 60.0 in | Wt 219.0 lb

## 2020-10-12 DIAGNOSIS — E782 Mixed hyperlipidemia: Secondary | ICD-10-CM | POA: Diagnosis not present

## 2020-10-12 DIAGNOSIS — I2510 Atherosclerotic heart disease of native coronary artery without angina pectoris: Secondary | ICD-10-CM

## 2020-10-12 DIAGNOSIS — I2584 Coronary atherosclerosis due to calcified coronary lesion: Secondary | ICD-10-CM | POA: Diagnosis not present

## 2020-10-12 DIAGNOSIS — F17200 Nicotine dependence, unspecified, uncomplicated: Secondary | ICD-10-CM

## 2020-10-12 DIAGNOSIS — Z789 Other specified health status: Secondary | ICD-10-CM | POA: Diagnosis not present

## 2020-10-12 NOTE — Patient Instructions (Signed)
Medication Instructions:  The current medical regimen is effective;  continue present plan and medications.  *If you need a refill on your cardiac medications before your next appointment, please call your pharmacy*  Follow-Up: At [facility], you and your health needs are our priority.  As part of our continuing mission to provide you with exceptional heart care, we have created designated Provider Care Teams.  These Care Teams include your primary Cardiologist (physician) and Advanced Practice Providers (APPs -  Physician Assistants and Nurse Practitioners) who all work together to provide you with the care you need, when you need it.  We recommend signing up for the patient portal called "MyChart".  Sign up information is provided on this After Visit Summary.  MyChart is used to connect with patients for Virtual Visits (Telemedicine).  Patients are able to view lab/test results, encounter notes, upcoming appointments, etc.  Non-urgent messages can be sent to your provider as well.   To learn more about what you can do with MyChart, go to [email or web address].    Your next appointment:   1 year(s)  The format for your next appointment:   In Person  Provider:   Mark Skains, MD   Thank you for choosing [facility]!!    

## 2020-10-12 NOTE — Progress Notes (Signed)
Cardiology Office Note:    Date:  10/12/2020   ID:  April May [place], DOB 03-Jul-1955, MRN [id number]  PCP:  April Lima, MD   [facility] Providers Cardiologist:  April Furbish, MD     Referring MD: April Lima, MD     History of Present Illness:    April May is a 65 y.o. female here for the follow-up of coronary artery disease/coronary artery calcifications noted on lung cancer CT screening on 02/03/2020 with prediabetes hypertension bradycardia COPD tobacco use Crohn's disease.  She has been seen in the lipid clinic.  She had trialed rosuvastatin 10 mg a day but reported muscle May in her right shoulder and arm.  Atorvastatin also caused muscle aches.  Livalo as well.  Past Medical History:  Diagnosis Date  . Allergy   . Anxiety   . Benign breast lumps   . COPD (chronic obstructive pulmonary disease) ([place])   . Crohn disease ([place])   . Depression   . Diabetes mellitus type II    borderline, md checks hemaglobin A 1 c at md office   . Hypertension   . Sleep apnea    does not use cpap since weight loss 1 and 1/2 years ago  . Small bowel obstruction ([place]) 2016  . 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 Vitamin D deficiency     Past Surgical History:  Procedure Laterality Date  . ABDOMINAL HYSTERECTOMY     1 ovary removed  . APPENDECTOMY    . APPLICATION OF WOUND VAC  11/24/2014   Procedure: APPLICATION OF WOUND VAC;  Surgeon: Donnie Mesa, MD;  Location: [facility] ORS;  Service: General;;  . BOWEL RESECTION N/A 11/24/2014   Procedure: SMALL BOWEL RESECTION;  Surgeon: Donnie Mesa, MD;  Location: [facility];  Service: General;  Laterality: N/A;  . BREAST EXCISIONAL BIOPSY Right 1972  . BREAST SURGERY Right 40 yrs ago   benign tumor removed  . LAPAROTOMY N/A 11/24/2014   Procedure: EXPLORATORY LAPAROTOMY EXTENSIVE LYSIS OF ADHESIONS SAMLL BOWEL RESECTION RIGHT HEMI COLECTOMY WOUND VAC APPLICATION. ;  Surgeon: Donnie Mesa, MD;  Location: [facility]  ORS;  Service: General;  Laterality: N/A;  . LYSIS OF ADHESION  11/24/2014   Procedure: LYSIS OF ADHESION (3 HRS);  Surgeon: Donnie Mesa, MD;  Location: [facility];  Service: General;;  . PARTIAL COLECTOMY  11/24/2014   Procedure: RIGHT HEMI COLECTOMY;  Surgeon: Donnie Mesa, MD;  Location: [facility];  Service: General;;  . SMALL INTESTINE SURGERY  age 20    Current Medications: Current Meds  Medication Sig  . amLODipine (NORVASC) 10 MG tablet Take 1 tablet (10 mg total) by mouth daily.  Marland Kitchen aspirin EC 81 MG tablet Take 1 tablet (81 mg total) by mouth daily. Swallow whole.  . budesonide (ENTOCORT EC) 3 MG 24 hr capsule Take 3 capsules (9 mg total) by mouth daily.  Marland Kitchen buPROPion (WELLBUTRIN SR) 150 MG 12 hr tablet Take 1 tablet (150 mg total) by mouth 2 (two) times daily.  . Cholecalciferol (VITAMIN D3) 1.25 MG (50000 UT) CAPS TAKE 1 CAPSULE BY MOUTH ONE TIME PER WEEK  . clonazePAM (KLONOPIN) 0.5 MG tablet Take 1 tablet (0.5 mg total) by mouth 2 (two) times daily as needed for anxiety.  . [facility] May RELIEF EXTRA STRENGTH 500 MG tablet TAKE 1 TABLET BY MOUTH EVERY 8 HOURS AS NEEDED FOR MODERATE May  . ezetimibe (ZETIA) 10 MG tablet Take 1 tablet (10 mg total) by mouth  daily.  . fluticasone (FLONASE) 50 MCG/ACT nasal spray Place 2 sprays into both nostrils daily.  . hydrALAZINE (APRESOLINE) 25 MG tablet Take 1 tablet (25 mg total) by mouth 3 (three) times daily.  Marland Kitchen lamoTRIgine (LAMICTAL) 200 MG tablet TAKE 1 TABLET BY MOUTH AT  BEDTIME  . mupirocin ointment (BACTROBAN) 2 % SMARTSIG:1 Application Topical 2-3 Times Daily  . nebivolol (BYSTOLIC) 10 MG tablet TAKE 1 TABLET BY MOUTH  DAILY  . nicotine (NICODERM CQ - DOSED IN MG/24 HOURS) 21 mg/24hr patch Place 1 patch (21 mg total) onto the skin daily.  . nicotine polacrilex (NICORETTE) 4 MG gum Take 1 each (4 mg total) by mouth as needed for smoking cessation.  . Potassium Citrate 15 MEQ (1620 MG) TBCR Take 1 tablet by mouth in the morning, at noon, and at  bedtime.  Marland Kitchen PROAIR HFA 108 (90 Base) MCG/ACT inhaler TAKE 2 PUFFS BY MOUTH EVERY 6 HOURS AS NEEDED FOR WHEEZE OR SHORTNESS OF BREATH  . spironolactone (ALDACTONE) 100 MG tablet Take 1 tablet (100 mg total) by mouth daily.  . Tiotropium Bromide-Olodaterol (STIOLTO RESPIMAT) 2.5-2.5 MCG/ACT AERS Inhale 2 puffs into the lungs daily.   Current Facility-Administered Medications for the 10/12/20 encounter (Office Visit) with April Pain, MD  Medication  . cyanocobalamin ((VITAMIN B-12)) injection 1,000 mcg     Allergies:   Azathioprine, Crestor [rosuvastatin calcium], Lipitor [atorvastatin calcium], and Lisinopril   Social History   Socioeconomic History  . Marital status: Divorced    Spouse name: Not on file  . Number of children: 2  . Years of education: Not on file  . Highest education level: 12th grade  Occupational History  . Occupation: disabled  Tobacco Use  . Smoking status: Current Every Day Smoker    Packs/day: 1.00    Years: 35.00    Pack years: 35.00    Types: Cigarettes  . Smokeless tobacco: Never Used  . Tobacco comment: 18 cigarettes daily 02/15/20 ARJ   Vaping Use  . Vaping Use: Never used  Substance and Sexual Activity  . Alcohol use: No  . Drug use: No  . Sexual activity: Not Currently  Other Topics Concern  . Not on file  Social History Narrative  . Not on file   Social Determinants of Health   Financial Resource Strain: Not on file  Food Insecurity: Not on file  Transportation Needs: Not on file  Physical Activity: Not on file  Stress: Not on file  Social Connections: Not on file     Family History: The patient's family history includes Alcohol abuse in her paternal uncle; Arthritis in her father; Brain cancer in her father; Crohn's disease in her mother; Diabetes in her father and paternal grandmother; Heart disease in her mother; Heart failure in her brother and sister; Hypertension in her mother; Kidney cancer in her brother; Kidney disease in her  mother and sister; Other in her sister; Stroke in her paternal grandmother; Ulcerative colitis in her mother.  ROS:   Please see the history of present illness.     All other systems reviewed and are negative.  EKGs/Labs/Other Studies Reviewed:    The following studies were reviewed today:  CT scan of chest lung cancer screening on 02/03/2020:  1. Lung-RADS 2, benign appearance or behavior. Continue annual screening with low-dose chest CT without contrast in 12 months. 2. Coronary artery calcifications noted. 3. Hepatic steatosis.  Personally reviewed and interpreted CT scan showing coronary artery calcification.    Recent  Labs: 06/21/2020: Hemoglobin 15.4; Magnesium 2.0; Platelets 250.0; TSH 1.89 09/20/2020: ALT 21; BUN 8; Creatinine, Ser 0.88; Potassium 3.2; Sodium 144  Recent Lipid Panel    Component Value Date/Time   CHOL 120 06/21/2020 1012   TRIG 90.0 06/21/2020 1012   HDL 43.30 06/21/2020 1012   CHOLHDL 3 06/21/2020 1012   VLDL 18.0 06/21/2020 1012   LDLCALC 58 06/21/2020 1012     Risk Assessment/Calculations:      Physical Exam:    VS:  BP 120/70 (BP Location: Left Arm, Patient Position: Sitting, Cuff Size: Large)   Pulse 78   Ht 5' (1.524 m)   Wt 219 lb (99.3 kg)   LMP 05/14/1988   SpO2 96%   BMI 42.77 kg/m     Wt Readings from Last 3 Encounters:  10/12/20 219 lb (99.3 kg)  09/20/20 239 lb (108.4 kg)  07/15/20 242 lb (109.8 kg)     GEN:  Well nourished, well developed in no acute distress HEENT: Normal NECK: No JVD; No carotid bruits LYMPHATICS: No lymphadenopathy CARDIAC: RRR, no murmurs, rubs, gallops RESPIRATORY:  Clear to auscultation without rales, wheezing or rhonchi  ABDOMEN: Soft, non-tender, non-distended MUSCULOSKELETAL:  No edema; No deformity  SKIN: Warm and dry NEUROLOGIC:  Alert and oriented x 3 PSYCHIATRIC:  Normal affect   ASSESSMENT:    1. Coronary artery calcification   2. TOBACCO ABUSE   3. Statin intolerance   4. Mixed  hyperlipidemia    PLAN:    In order of problems listed above:  Hyperlipidemia in the setting of coronary artery calcification/coronary atherosclerosis seen on CT. - Appreciate lipid clinic assistance, extensive conversations regarding patient's cardiovascular risk.  She ended up developing extensive coronary calcifications despite baseline LDL in the 40s and 50s.  We would like to reach target LDL of less than 40.  She started Zetia 10 mg a day on 09/22/2020.  An advanced lipid panel was ordered for August looking at LDL particle #8 will be as well as LP(a).  Continue with medical management.  Refills as needed. - Had some excellent questions about lipids/coronary artery disease.  Essential hypertension - Currently on amlodipine 10 mg, hydralazine 25 mg 3 times a day, Bystolic 10 mg daily and spironolactone 100 mg a day.  Prescribed via Dr. Ronnald Ramp.  Continue with current medical management.  She is concerned that she is on too many medications.  Her blood pressure is excellent today.  Continue encourage diet and exercise.  Crohn's disease - Per GI.  Tobacco use - Continue to encourage tobacco counseling.  1 yr f/u   Medication Adjustments/Labs and Tests Ordered: Current medicines are reviewed at length with the patient today.  Concerns regarding medicines are outlined above.  No orders of the defined types were placed in this encounter.  No orders of the defined types were placed in this encounter.   Patient Instructions  Medication Instructions:  The current medical regimen is effective;  continue present plan and medications.  *If you need a refill on your cardiac medications before your next appointment, please call your pharmacy*  Follow-Up: At [facility], you and your health needs are our priority.  As part of our continuing mission to provide you with exceptional heart care, we have created designated Provider Care Teams.  These Care Teams include your primary Cardiologist  (physician) and Advanced Practice Providers (APPs -  Physician Assistants and Nurse Practitioners) who all work together to provide you with the care you need, when you need  it.  We recommend signing up for the patient portal called "MyChart".  Sign up information is provided on this After Visit Summary.  MyChart is used to connect with patients for Virtual Visits (Telemedicine).  Patients are able to view lab/test results, encounter notes, upcoming appointments, etc.  Non-urgent messages can be sent to your provider as well.   To learn more about what you can do with MyChart, go to [email or web address].    Your next appointment:   1 year(s)  The format for your next appointment:   In Person  Provider:   Candee Furbish, MD   Thank you for choosing [facility]!!          Signed, April Furbish, MD  10/12/2020 11:06 AM    [place]

## 2020-10-13 ENCOUNTER — Encounter: Payer: Self-pay | Admitting: Internal Medicine

## 2020-10-17 NOTE — Telephone Encounter (Signed)
  Follow up message  Patient calling for response to message

## 2020-10-18 ENCOUNTER — Telehealth: Payer: Self-pay

## 2020-10-18 DIAGNOSIS — G4733 Obstructive sleep apnea (adult) (pediatric): Secondary | ICD-10-CM | POA: Diagnosis not present

## 2020-10-18 NOTE — Telephone Encounter (Signed)
error 

## 2020-10-21 ENCOUNTER — Ambulatory Visit (INDEPENDENT_AMBULATORY_CARE_PROVIDER_SITE_OTHER): Payer: Medicare Other

## 2020-10-21 ENCOUNTER — Other Ambulatory Visit: Payer: Self-pay

## 2020-10-21 DIAGNOSIS — E538 Deficiency of other specified B group vitamins: Secondary | ICD-10-CM

## 2020-10-21 NOTE — Progress Notes (Signed)
Pt here for monthly B12 injection per Dr.Jones  B12 1023mg given IM, and pt tolerated injection well.  Next B12 injection scheduled for 11/23/20

## 2020-10-25 ENCOUNTER — Other Ambulatory Visit: Payer: Self-pay | Admitting: Internal Medicine

## 2020-10-25 DIAGNOSIS — Z1231 Encounter for screening mammogram for malignant neoplasm of breast: Secondary | ICD-10-CM

## 2020-10-27 ENCOUNTER — Other Ambulatory Visit: Payer: Self-pay

## 2020-10-27 ENCOUNTER — Encounter: Payer: Self-pay | Admitting: Internal Medicine

## 2020-10-27 ENCOUNTER — Ambulatory Visit (INDEPENDENT_AMBULATORY_CARE_PROVIDER_SITE_OTHER): Payer: Medicare Other | Admitting: Internal Medicine

## 2020-10-27 VITALS — BP 126/72 | HR 63 | Temp 98.8°F | Ht 60.0 in | Wt 232.6 lb

## 2020-10-27 DIAGNOSIS — E876 Hypokalemia: Secondary | ICD-10-CM | POA: Diagnosis not present

## 2020-10-27 DIAGNOSIS — I10 Essential (primary) hypertension: Secondary | ICD-10-CM | POA: Diagnosis not present

## 2020-10-27 DIAGNOSIS — R400 Somnolence: Secondary | ICD-10-CM

## 2020-10-27 DIAGNOSIS — T502X5A Adverse effect of carbonic-anhydrase inhibitors, benzothiadiazides and other diuretics, initial encounter: Secondary | ICD-10-CM | POA: Diagnosis not present

## 2020-10-27 DIAGNOSIS — N1831 Chronic kidney disease, stage 3a: Secondary | ICD-10-CM | POA: Diagnosis not present

## 2020-10-27 LAB — BASIC METABOLIC PANEL
BUN: 16 mg/dL (ref 6–23)
CO2: 29 mEq/L (ref 19–32)
Calcium: 10.1 mg/dL (ref 8.4–10.5)
Chloride: 105 mEq/L (ref 96–112)
Creatinine, Ser: 1.17 mg/dL (ref 0.40–1.20)
GFR: 49.14 mL/min — ABNORMAL LOW (ref >60.00)
Glucose, Bld: 90 mg/dL (ref 70–99)
Potassium: 4.5 mEq/L (ref 3.5–5.1)
Sodium: 140 mEq/L (ref 135–145)

## 2020-10-27 LAB — MAGNESIUM: Magnesium: 2 mg/dL (ref 1.5–2.5)

## 2020-10-27 MED ORDER — MODAFINIL 100 MG PO TABS: 100.0000 mg | ORAL_TABLET | Freq: Every day | ORAL | 0 refills | Status: DC

## 2020-10-27 NOTE — Progress Notes (Signed)
Subjective:  Patient ID: April May, female    DOB: [birth year]  Age: 65 y.o. MRN: [id number]  CC: Hypertension  This visit occurred during the SARS-CoV-2 public health emergency.  Safety protocols were in place, including screening questions prior to the visit, additional usage of staff PPE, and extensive cleaning of exam room while observing appropriate contact time as indicated for disinfecting solutions.    HPI April May [facility] presents for f/up -  She complains that she is sleepy and fatigued throughout the day.  She takes naps.  She is being treated for sleep apnea.  She tells me she thinks her blood pressure has been a little too good.  She denies dizziness, lightheadedness, chest pain, shortness of breath, edema, or fatigue.  Outpatient Medications Prior to Visit  Medication Sig Dispense Refill   budesonide (ENTOCORT EC) 3 MG 24 hr capsule Take 3 capsules (9 mg total) by mouth daily. 270 capsule 0   buPROPion (WELLBUTRIN SR) 150 MG 12 hr tablet Take 1 tablet (150 mg total) by mouth 2 (two) times daily. 180 tablet 1   Cholecalciferol (VITAMIN D3) 1.25 MG (50000 UT) CAPS TAKE 1 CAPSULE BY MOUTH ONE TIME PER WEEK 12 capsule 0   clonazePAM (KLONOPIN) 0.5 MG tablet Take 1 tablet (0.5 mg total) by mouth 2 (two) times daily as needed for anxiety. 60 tablet 2   [employer] PAIN RELIEF EXTRA STRENGTH 500 MG tablet TAKE 1 TABLET BY MOUTH EVERY 8 HOURS AS NEEDED FOR MODERATE PAIN 60 tablet 5   ezetimibe (ZETIA) 10 MG tablet Take 1 tablet (10 mg total) by mouth daily. 90 tablet 3   fluticasone (FLONASE) 50 MCG/ACT nasal spray Place 2 sprays into both nostrils daily. 48 g 3   hydrALAZINE (APRESOLINE) 25 MG tablet Take 1 tablet (25 mg total) by mouth 3 (three) times daily. 270 tablet 0   lamoTRIgine (LAMICTAL) 200 MG tablet TAKE 1 TABLET BY MOUTH AT  BEDTIME 90 tablet 1   nebivolol (BYSTOLIC) 10 MG tablet TAKE 1 TABLET BY MOUTH  DAILY 90 tablet 1   Potassium Citrate 15 MEQ (1620 MG)  TBCR Take 1 tablet by mouth in the morning, at noon, and at bedtime. 270 tablet 0   PROAIR HFA 108 (90 Base) MCG/ACT inhaler TAKE 2 PUFFS BY MOUTH EVERY 6 HOURS AS NEEDED FOR WHEEZE OR SHORTNESS OF BREATH 18 g 5   spironolactone (ALDACTONE) 100 MG tablet Take 1 tablet (100 mg total) by mouth daily. 90 tablet 0   Tiotropium Bromide-Olodaterol (STIOLTO RESPIMAT) 2.5-2.5 MCG/ACT AERS Inhale 2 puffs into the lungs daily. 12 g 1   amLODipine (NORVASC) 10 MG tablet Take 1 tablet (10 mg total) by mouth daily. 90 tablet 1   aspirin EC 81 MG tablet Take 1 tablet (81 mg total) by mouth daily. Swallow whole. (Patient not taking: Reported on 10/27/2020) 90 tablet 3   mupirocin ointment (BACTROBAN) 2 % SMARTSIG:1 Application Topical 2-3 Times Daily (Patient not taking: Reported on 10/27/2020)     nicotine (NICODERM CQ - DOSED IN MG/24 HOURS) 21 mg/24hr patch Place 1 patch (21 mg total) onto the skin daily. (Patient not taking: Reported on 10/27/2020) 28 patch 3   nicotine polacrilex (NICORETTE) 4 MG gum Take 1 each (4 mg total) by mouth as needed for smoking cessation. (Patient not taking: Reported on 10/27/2020) 100 tablet 3   cyanocobalamin ((VITAMIN B-12)) injection 1,000 mcg      No facility-administered medications prior to visit.    ROS  Review of Systems  Constitutional:  Positive for fatigue. Negative for diaphoresis and unexpected weight change.  HENT: Negative.    Eyes:  Negative for visual disturbance.  Respiratory:  Negative for cough, chest tightness, shortness of breath and wheezing.   Cardiovascular:  Negative for chest pain and leg swelling.  Gastrointestinal:  Negative for abdominal pain, constipation, diarrhea, nausea and vomiting.  Endocrine: Negative.   Genitourinary: Negative.  Negative for difficulty urinating.  Musculoskeletal: Negative.  Negative for back pain and myalgias.  Skin: Negative.  Negative for color change and pallor.  Neurological: Negative.  Negative for dizziness,  weakness and headaches.  Hematological:  Negative for adenopathy. Does not bruise/bleed easily.  Psychiatric/Behavioral: Negative.     Objective:  BP 126/72 (BP Location: Left Arm, Patient Position: Sitting, Cuff Size: Large)   Pulse 63   Temp 98.8 F (37.1 C) (Oral)   Ht 5' (1.524 m)   Wt 232 lb 9.6 oz (105.5 kg)   LMP 05/14/1988   SpO2 97%   BMI 45.43 kg/m   BP Readings from Last 3 Encounters:  10/27/20 126/72  10/12/20 120/70  09/20/20 (!) 154/98    Wt Readings from Last 3 Encounters:  10/27/20 232 lb 9.6 oz (105.5 kg)  10/12/20 219 lb (99.3 kg)  09/20/20 239 lb (108.4 kg)    Physical Exam Vitals reviewed.  HENT:     Nose: Nose normal.     Mouth/Throat:     Mouth: Mucous membranes are moist.  Eyes:     General: No scleral icterus.    Conjunctiva/sclera: Conjunctivae normal.  Cardiovascular:     Rate and Rhythm: Normal rate and regular rhythm.     Heart sounds: No murmur heard. Pulmonary:     Effort: Pulmonary effort is normal.     Breath sounds: No stridor. No wheezing, rhonchi or rales.  Abdominal:     General: Abdomen is flat.     Palpations: There is no mass.     Tenderness: There is no abdominal tenderness. There is no guarding.  Musculoskeletal:        General: Normal range of motion.     Cervical back: Neck supple.     Right lower leg: No edema.     Left lower leg: No edema.  Lymphadenopathy:     Cervical: No cervical adenopathy.  Skin:    General: Skin is warm and dry.  Neurological:     General: No focal deficit present.     Mental Status: She is alert.  Psychiatric:        Mood and Affect: Mood normal.        Behavior: Behavior normal.    Lab Results  Component Value Date   WBC 11.8 (H) 06/21/2020   HGB 15.4 (H) 06/21/2020   HCT 47.0 (H) 06/21/2020   PLT 250.0 06/21/2020   GLUCOSE 90 10/27/2020   CHOL 120 06/21/2020   TRIG 90.0 06/21/2020   HDL 43.30 06/21/2020   LDLCALC 58 06/21/2020   ALT 21 09/20/2020   AST 15 09/20/2020   NA  140 10/27/2020   K 4.5 10/27/2020   CL 105 10/27/2020   CREATININE 1.17 10/27/2020   BUN 16 10/27/2020   CO2 29 10/27/2020   TSH 1.89 06/21/2020   HGBA1C 5.7 06/21/2020   MICROALBUR <0.7 06/10/2019    CT CHEST LUNG CA SCREEN LOW DOSE W/O CM  Result Date: 02/03/2020 CLINICAL DATA:  Lung cancer screening. Thirty-four pack-year history. Current asymptomatic smoker. EXAM: CT CHEST WITHOUT  CONTRAST LOW-DOSE FOR LUNG CANCER SCREENING TECHNIQUE: Multidetector CT imaging of the chest was performed following the standard protocol without IV contrast. COMPARISON:  None. FINDINGS: Cardiovascular: Heart size is normal. No pericardial effusion. Aortic atherosclerosis. Coronary artery atherosclerotic calcifications noted. Mediastinum/Nodes: No enlarged mediastinal, hilar, or axillary lymph nodes. Thyroid gland, trachea, and esophagus demonstrate no significant findings. Lungs/Pleura: Centrilobular emphysema. No pleural effusion, airspace consolidation or atelectasis. Scar noted within the right middle lobe. Single tiny lung nodule in the anterolateral right lower lobe has an equivalent diameter 1.9 mm. Upper Abdomen: No acute abnormality identified within the imaged portions of the upper abdomen. Hepatic steatosis noted. Musculoskeletal: No acute abnormality.  Thoracic spondylosis noted. IMPRESSION: 1. Lung-RADS 2, benign appearance or behavior. Continue annual screening with low-dose chest CT without contrast in 12 months. 2. Coronary artery calcifications noted. 3. Hepatic steatosis. Aortic Atherosclerosis (ICD10-I70.0) and Emphysema (ICD10-J43.9). Electronically Signed   By: Kerby Moors M.D.   On: 02/03/2020 13:35    Assessment & Plan:   Patric was seen today for hypertension.  Diagnoses and all orders for this visit:  Malignant hypertension- Her blood pressure is somewhat overcontrolled and she has had a decline in her renal function.  Will discontinue amlodipine. -     Basic metabolic panel;  Future -     Magnesium; Future -     Magnesium -     Basic metabolic panel  Diuretic-induced hypokalemia- Her potassium level is normal now. -     Basic metabolic panel; Future -     Magnesium; Future -     Magnesium -     Basic metabolic panel  Uncontrolled daytime somnolence -     Discontinue: modafinil (PROVIGIL) 100 MG tablet; Take 1 tablet (100 mg total) by mouth daily. -     Armodafinil 150 MG tablet; Take 1 tablet (150 mg total) by mouth daily.  Stage 3a chronic kidney disease ([place])- She will avoid nephrotoxic agents.  I have discontinued Rola W. Ignatowski's mupirocin ointment, aspirin EC, amLODipine, nicotine, nicotine polacrilex, and modafinil. I am also having her start on Armodafinil. Additionally, I am having her maintain her [employer]S Pain Relief Extra Strength, ProAir HFA, fluticasone, Stiolto Respimat, clonazePAM, buPROPion, lamoTRIgine, budesonide, Vitamin D3, hydrALAZINE, Potassium Citrate, spironolactone, ezetimibe, and nebivolol. We will stop administering cyanocobalamin.  Meds ordered this encounter  Medications   DISCONTD: modafinil (PROVIGIL) 100 MG tablet    Sig: Take 1 tablet (100 mg total) by mouth daily.    Dispense:  90 tablet    Refill:  0   Armodafinil 150 MG tablet    Sig: Take 1 tablet (150 mg total) by mouth daily.    Dispense:  90 tablet    Refill:  0     Follow-up: Return in about 6 months (around 04/28/2021).  Scarlette Calico, MD

## 2020-10-28 ENCOUNTER — Encounter: Payer: Self-pay | Admitting: Internal Medicine

## 2020-10-28 DIAGNOSIS — N1831 Chronic kidney disease, stage 3a: Secondary | ICD-10-CM | POA: Insufficient documentation

## 2020-10-28 MED ORDER — ARMODAFINIL 150 MG PO TABS: 150.0000 mg | ORAL_TABLET | Freq: Every day | ORAL | 0 refills | Status: DC

## 2020-10-29 ENCOUNTER — Other Ambulatory Visit: Payer: Self-pay | Admitting: Gastroenterology

## 2020-10-30 ENCOUNTER — Other Ambulatory Visit: Payer: Self-pay | Admitting: Internal Medicine

## 2020-10-30 DIAGNOSIS — J449 Chronic obstructive pulmonary disease, unspecified: Secondary | ICD-10-CM

## 2020-10-31 ENCOUNTER — Encounter: Payer: Self-pay | Admitting: Neurology

## 2020-10-31 ENCOUNTER — Ambulatory Visit: Payer: Medicare Other | Admitting: Neurology

## 2020-10-31 VITALS — BP 122/86 | HR 59 | Ht 60.0 in | Wt 236.0 lb

## 2020-10-31 DIAGNOSIS — Z9989 Dependence on other enabling machines and devices: Secondary | ICD-10-CM | POA: Diagnosis not present

## 2020-10-31 DIAGNOSIS — G4733 Obstructive sleep apnea (adult) (pediatric): Secondary | ICD-10-CM | POA: Diagnosis not present

## 2020-10-31 NOTE — Patient Instructions (Signed)
It was nice to see you again today.  You are compliant with your CPAP.  As discussed, we will turn off the ramp and increase your pressure for better tolerance to 9 cm at this time, we will send the order to your DME company, [employer].  Please follow-up routinely to see one of our nurse practitioners in 6 months.  Keep [place] posted by MyChart messaging in the interim or you can call the office at any time during the workweek for an update or for interim questions.  Please continue using your CPAP regularly. While your insurance requires that you use CPAP at least 4 hours each night on 70% of the nights, I recommend, that you not skip any nights and use it throughout the night if you can. Getting used to CPAP and staying with the treatment long term does take time and patience and discipline. Untreated obstructive sleep apnea when it is moderate to severe can have an adverse impact on cardiovascular health and raise her risk for heart disease, arrhythmias, hypertension, congestive heart failure, stroke and diabetes. Untreated obstructive sleep apnea causes sleep disruption, nonrestorative sleep, and sleep deprivation. This can have an impact on your day to day functioning and cause daytime sleepiness and impairment of cognitive function, memory loss, mood disturbance, and problems focussing. Using CPAP regularly can improve these symptoms.

## 2020-10-31 NOTE — Progress Notes (Signed)
Subjective:    May ID: April May is a 65 y.o. female.  HPI    Interim history:   April May is a 65 year old right-handed woman with an underlying medical history of hypertension, diabetes, depression, allergies, COPD, Crohn's disease, vitamin D deficiency, squamous cell carcinoma, history of small bowel obstruction, and morbid obesity with a BMI of over 31, who presents for follow-up consultation of her obstructive sleep apnea after interim testing and starting CPAP therapy.  April May is unaccompanied today.  I first met her at April request of her primary care physician on 12/29/2019, at which time she reported a prior diagnosis of obstructive sleep apnea.  She was no longer on CPAP therapy.  She was advised to proceed with sleep testing.  She had a baseline sleep study on 01/05/2020 which showed moderate obstructive sleep apnea with an AHI of 15.9/h, O2 nadir 82%.  She had a subsequent titration study on 01/24/2020.  She was fitted with a nasal cradle interface and CPAP was titrated from 5 cm to 8 cm.  On April final pressure her AHI was 0/h, O2 nadir 89% with nonsupine REM sleep achieved.  She was advised to start CPAP therapy at home, set up date was 08/08/2020.  Today, 10/31/2020: I reviewed her CPAP compliance data from 09/27/2020 through 10/26/2020, which is a total of 30 days, during which time she used her machine every night with percent use days greater than 4 hours at 93%, indicating excellent compliance with an average usage of 6 hours and 28 minutes, residual AHI at goal at 1.8/h, leak acceptable with a 95th percentile at 10.5 L/min on a pressure of 8 cm with EPR of 3. She reports that she is still adapting to treatment, April pressure is not enough at 8 cm, she feels that it builds up to slowly from 4 cm and would prefer that it blows directly at April target treatment level.  She would prefer a higher pressure.  She is using a small wide nasal cushion interface.  She is  motivated to continue with treatment but has not had telltale improvement in her daytime tiredness yet.  April May's allergies, current medications, family history, past medical history, past social history, past surgical history and problem list were reviewed and updated as appropriate.   Previously:   12/29/19: (She) was previously diagnosed with obstructive sleep apnea.  She is currently not on CPAP therapy.  She had a split-night sleep study on 07/23/2011 and I was able to review April results.  Her total AHI was 20.3/h at April time.  She reports that she used CPAP therapy for about a year via full facemask.  She found it cumbersome to use and also cumbersome to clean.  She had a upper respiratory infection and after that she stopped using her machine.  She is willing to get retested and consider CPAP therapy again but would prefer a device that is easy to clean and April mask that is easy to maintain cleanliness.  She has had some weight fluctuation.  She reports lack of energy.  She does take naps during April day and does not keep a very set schedule for her sleep.  She does not work, she is on disability.  She has a sister with sleep apnea.  She has nocturia about once per average night.  She is trying to quit smoking, she quit for about a week in April past but went back on it.  She smokes about 1 pack/day.  She is divorced, lives alone, has 2 grown children and 3 grandchildren.  Her Epworth sleepiness score is 9 out of 24, fatigue severity score is 61 out of 63.  She does not drink caffeine daily, does not drink alcohol.  Her Past Medical History Is Significant For: Past Medical History:  Diagnosis Date   Allergy    Anxiety    Benign breast lumps    COPD (chronic obstructive pulmonary disease) ([place])    Crohn disease ([place])    Depression    Diabetes mellitus type II    borderline, md checks hemaglobin A 1 c at md office    Hypertension    Sleep apnea    does not use cpap since weight loss 1 and  1/2 years ago   Small bowel obstruction ([place]) 2016   Squamous cell carcinoma of arm, 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   Vitamin D deficiency     Her Past Surgical History Is Significant For: Past Surgical History:  Procedure Laterality Date   ABDOMINAL HYSTERECTOMY     1 ovary removed   APPENDECTOMY     APPLICATION OF WOUND VAC  11/24/2014   Procedure: APPLICATION OF WOUND VAC;  Surgeon: Donnie Mesa, MD;  Location: [facility];  Service: General;;   BOWEL RESECTION N/A 11/24/2014   Procedure: SMALL BOWEL RESECTION;  Surgeon: Donnie Mesa, MD;  Location: [facility];  Service: General;  Laterality: N/A;   BREAST EXCISIONAL BIOPSY Right 1972   BREAST SURGERY Right 40 yrs ago   benign tumor removed   LAPAROTOMY N/A 11/24/2014   Procedure: EXPLORATORY LAPAROTOMY EXTENSIVE LYSIS OF ADHESIONS SAMLL BOWEL RESECTION RIGHT HEMI COLECTOMY WOUND VAC APPLICATION. ;  Surgeon: Donnie Mesa, MD;  Location: [facility];  Service: General;  Laterality: N/A;   LYSIS OF ADHESION  11/24/2014   Procedure: LYSIS OF ADHESION (3 HRS);  Surgeon: Donnie Mesa, MD;  Location: [facility];  Service: General;;   PARTIAL COLECTOMY  11/24/2014   Procedure: RIGHT HEMI COLECTOMY;  Surgeon: Donnie Mesa, MD;  Location: [facility];  Service: General;;   SMALL INTESTINE SURGERY  age 56    Her Family History Is Significant For: Family History  Problem Relation Age of Onset   Hypertension Mother    Ulcerative colitis Mother    Crohn's disease Mother    Heart disease Mother    Kidney disease Mother    Arthritis Father    Brain cancer Father    Diabetes Father    Alcohol abuse Paternal Uncle    Diabetes Paternal Grandmother    Stroke Paternal Grandmother    Heart failure Sister    Kidney disease Sister    Heart failure Brother    Other Sister        prediabetes   Kidney cancer Brother     Her Social History Is Significant For: Social History   Socioeconomic History   Marital status: Divorced    Spouse name: Not on file   Number of children:  2   Years of education: Not on file   Highest education level: 12th grade  Occupational History   Occupation: disabled  Tobacco Use   Smoking status: Every Day    Packs/day: 1.00    Years: 35.00    Pack years: 35.00    Types: Cigarettes   Smokeless tobacco: Never   Tobacco comments:    18 cigarettes daily 02/15/20 [facility]   Vaping Use   Vaping Use: Never used  Substance and Sexual Activity   Alcohol use: No  Drug use: No   Sexual activity: Not Currently  Other Topics Concern   Not on file  Social History Narrative   Not on file   Social Determinants of Health   Financial Resource Strain: Not on file  Food Insecurity: Not on file  Transportation Needs: Not on file  Physical Activity: Not on file  Stress: Not on file  Social Connections: Not on file    Her Allergies Are:  Allergies  Allergen Reactions   Azathioprine Other (See Comments)    Side effects to pancrease      Crestor [Rosuvastatin Calcium] Other (See Comments)    Muscle and joint aches   Lipitor [Atorvastatin Calcium] Other (See Comments)    Muscle cramps   Lisinopril Cough  :   Her Current Medications Are:  Outpatient Encounter Medications as of 10/31/2020  Medication Sig   Armodafinil 150 MG tablet Take 1 tablet (150 mg total) by mouth daily.   budesonide (ENTOCORT EC) 3 MG 24 hr capsule Take 3 capsules (9 mg total) by mouth daily.   buPROPion (WELLBUTRIN SR) 150 MG 12 hr tablet Take 1 tablet (150 mg total) by mouth 2 (two) times daily.   Cholecalciferol (VITAMIN D3) 1.25 MG (50000 UT) CAPS TAKE 1 CAPSULE BY MOUTH ONE TIME PER WEEK   clonazePAM (KLONOPIN) 0.5 MG tablet Take 1 tablet (0.5 mg total) by mouth 2 (two) times daily as needed for anxiety.   [employer] PAIN RELIEF EXTRA STRENGTH 500 MG tablet TAKE 1 TABLET BY MOUTH EVERY 8 HOURS AS NEEDED FOR MODERATE PAIN   ezetimibe (ZETIA) 10 MG tablet Take 1 tablet (10 mg total) by mouth daily.   fluticasone (FLONASE) 50 MCG/ACT nasal spray Place 2 sprays into both  nostrils daily.   hydrALAZINE (APRESOLINE) 25 MG tablet Take 1 tablet (25 mg total) by mouth 3 (three) times daily.   lamoTRIgine (LAMICTAL) 200 MG tablet TAKE 1 TABLET BY MOUTH AT  BEDTIME   nebivolol (BYSTOLIC) 10 MG tablet TAKE 1 TABLET BY MOUTH  DAILY   Potassium Citrate 15 MEQ (1620 MG) TBCR Take 1 tablet by mouth in April morning, at noon, and at bedtime.   PROAIR HFA 108 (90 Base) MCG/ACT inhaler TAKE 2 PUFFS BY MOUTH EVERY 6 HOURS AS NEEDED FOR WHEEZE OR SHORTNESS OF BREATH   spironolactone (ALDACTONE) 100 MG tablet Take 1 tablet (100 mg total) by mouth daily.   STIOLTO RESPIMAT 2.5-2.5 MCG/ACT AERS USE 2 INHALATIONS BY MOUTH  DAILY   No facility-administered encounter medications on file as of 10/31/2020.  :  Review of Systems:  Out of a complete 14 point review of systems, all are reviewed and negative with April exception of these symptoms as listed below:  Review of Systems  Neurological:        Here for f/u on autopap, reports she is not noticing much benefit from April machine.    Objective:  Neurological Exam  Physical Exam Physical Examination:   Vitals:   10/31/20 0837  BP: 122/86  Pulse: (!) 59  SpO2: 98%    General Examination: April May is a very pleasant 65 y.o. female in no acute distress. She appears well-developed and well-nourished and well groomed.   HEENT: Normocephalic, atraumatic, pupils are equal, round and reactive to light, extraocular tracking is good without limitation to gaze excursion or nystagmus noted. Hearing is grossly intact. Face is symmetric with normal facial animation. Speech is clear with no dysarthria noted. There is no hypophonia. There is no lip,  neck/head, jaw or voice tremor. Neck is supple with full range of passive and active motion. There are no carotid bruits on auscultation. Oropharynx exam reveals: mild mouth dryness, adequate dental hygiene with dentures in place, Mallampati class II, somewhat smaller airway, tonsils absent.   Tongue protrudes centrally and palate elevates symmetrically.     Chest: Clear to auscultation without wheezing, rhonchi or crackles noted.   Heart: S1+S2+0, regular and normal without murmurs, rubs or gallops noted.   Abdomen: Soft, non-tender and non-distended.   Extremities: There is no pitting edema in April distal lower extremities bilaterally.   Skin: Warm and dry without trophic changes noted.   Musculoskeletal: exam reveals no obvious joint deformities.   Neurologically: Mental status: April May is awake, alert and oriented in all 4 spheres. Her immediate and remote memory, attention, language skills and fund of knowledge are appropriate. There is no evidence of aphasia, agnosia, apraxia or anomia. Speech is clear with normal prosody and enunciation. Thought process is linear. Mood is normal and affect is normal. Cranial nerves II - XII are as described above under HEENT exam. Motor exam: Normal bulk, strength and tone is noted. There is no obvious tremor. Fine motor skills and coordination: grossly intact. Cerebellar testing: No dysmetria or intention tremor. There is no truncal or gait ataxia. Sensory exam: intact to light touch in April upper and lower extremities. Gait, station and balance: She stands easily. No veering to one side is noted. No leaning to one side is noted. Posture is age-appropriate and stance is narrow based. Gait shows normal stride length and normal pace. No problems turning are noted.   Assessment and Plan:  In summary, April May is a very pleasant 65 year old right-handed woman with an underlying medical history of hypertension, diabetes, depression, allergies, COPD, Crohn's disease, vitamin D deficiency, squamous cell carcinoma, history of small bowel obstruction, and morbid obesity with a BMI of over 24, who presents for follow-up consultation of her obstructive sleep apnea.  She had a baseline sleep study in August 2021 which showed moderate  obstructive sleep apnea with an AHI of 15.9/h, O2 nadir 82%, absence of supine sleep during that study.  She had a subsequent titration study on 01/24/2020 with treatment of pressure determined at 8 cm of water pressure.  She is using a nasal cushion interface but does not feel that April pressure is enough at times.  We mutually agreed to turn April ramp feature of and increase her pressure to 9 cm at this time.  She is motivated to continue with treatment.  She is compliant with her treatment and is commended for it.  We will plan a follow-up in this clinic in 6 months for her to see one of our nurse practitioners.  Hopefully, if she does well at this time, we can see her yearly thereafter.  She is advised to call us with any interim questions or concerns or email [place] through [place].  I answered all her questions today and she was in agreement.  I spent 30 minutes in total face-to-face time and in reviewing records during pre-charting, more than 50% of which was spent in counseling and coordination of care, reviewing test results, reviewing medications and treatment regimen and/or in discussing or reviewing April diagnosis of OSA, April prognosis and treatment options. Pertinent laboratory and imaging test results that were available during this visit with April May were reviewed by me and considered in my medical decision making (see chart for details).

## 2020-11-02 ENCOUNTER — Other Ambulatory Visit: Payer: Self-pay | Admitting: Internal Medicine

## 2020-11-02 ENCOUNTER — Encounter: Payer: Self-pay | Admitting: Internal Medicine

## 2020-11-02 ENCOUNTER — Telehealth: Payer: Self-pay | Admitting: Gastroenterology

## 2020-11-02 DIAGNOSIS — R400 Somnolence: Secondary | ICD-10-CM

## 2020-11-02 DIAGNOSIS — J449 Chronic obstructive pulmonary disease, unspecified: Secondary | ICD-10-CM

## 2020-11-02 MED ORDER — BUDESONIDE 3 MG PO CPEP: 9.0000 mg | ORAL_CAPSULE | Freq: Every day | ORAL | 0 refills | Status: DC

## 2020-11-02 MED ORDER — AMPHETAMINE-DEXTROAMPHET ER 10 MG PO CP24: 10.0000 mg | ORAL_CAPSULE | Freq: Every day | ORAL | 0 refills | Status: DC

## 2020-11-02 NOTE — Telephone Encounter (Signed)
Prescription for budesonide sent to [facility] until scheduled appt.

## 2020-11-02 NOTE — Telephone Encounter (Signed)
Inbound call from patient requesting medication refill for Budesonide 33m sent to [facility] Have a appointment scheduled for 12/08/20.

## 2020-11-07 DIAGNOSIS — G4733 Obstructive sleep apnea (adult) (pediatric): Secondary | ICD-10-CM | POA: Diagnosis not present

## 2020-11-08 DIAGNOSIS — G4733 Obstructive sleep apnea (adult) (pediatric): Secondary | ICD-10-CM | POA: Diagnosis not present

## 2020-11-18 NOTE — Telephone Encounter (Signed)
    Patient calling to discuss / has questions about taking amphetamine-dextroamphetamine (ADDERALL XR) 10 MG 24 hr capsule  She is unsure how the medication should work in her system

## 2020-11-21 ENCOUNTER — Other Ambulatory Visit: Payer: Self-pay | Admitting: Internal Medicine

## 2020-11-21 ENCOUNTER — Encounter: Payer: Self-pay | Admitting: Internal Medicine

## 2020-11-22 ENCOUNTER — Ambulatory Visit (INDEPENDENT_AMBULATORY_CARE_PROVIDER_SITE_OTHER): Payer: Medicare Other

## 2020-11-22 DIAGNOSIS — Z0000 Encounter for general adult medical examination without abnormal findings: Secondary | ICD-10-CM | POA: Diagnosis not present

## 2020-11-22 NOTE — Telephone Encounter (Signed)
Called pt, LVM.   

## 2020-11-22 NOTE — Progress Notes (Signed)
I connected with April May today by video and verified that I am speaking with the correct person using two identifiers. Location patient: home Location provider: work Persons participating in the virtual visit: April May and April Abu, LPN.   I discussed the limitations, risks, security and privacy concerns of performing an evaluation and management service by telephone and the availability of in person appointments. I also discussed with the patient that there may be a patient responsible charge related to this service. The patient expressed understanding and verbally consented to this telephonic/video visit.    Some vital signs may be absent or patient reported.   Time Spent with patient on telephone encounter: 30 minutes  Subjective:   April May is a 65 y.o. female who presents for Medicare Annual (Subsequent) preventive examination.  Review of Systems     Cardiac Risk Factors include: advanced age (>81mn, >>38women);dyslipidemia;family history of premature cardiovascular disease;hypertension;obesity (BMI >30kg/m2);sedentary lifestyle;smoking/ tobacco exposure     Objective:    There were no vitals filed for this visit. There is no height or weight on file to calculate BMI.  Advanced Directives 11/22/2020 05/20/2018 09/25/2017 05/30/2017 03/12/2017 01/31/2017 10/19/2015  Does Patient Have a Medical Advance Directive? Yes No No No No No No  Does patient want to make changes to medical advance directive? No - Patient declined - - - - - -  Would patient like information on creating a medical advance directive? - No - Patient declined - No - Patient declined - Yes (ED - Information included in AVS) No - patient declined information    Current Medications (verified) Outpatient Encounter Medications as of 11/22/2020  Medication Sig   budesonide (ENTOCORT EC) 3 MG 24 hr capsule Take 3 capsules (9 mg total) by mouth daily.   buPROPion (WELLBUTRIN SR) 150  MG 12 hr tablet Take 1 tablet (150 mg total) by mouth 2 (two) times daily.   Cholecalciferol (VITAMIN D3) 1.25 MG (50000 UT) CAPS TAKE 1 CAPSULE BY MOUTH ONE TIME PER WEEK   clonazePAM (KLONOPIN) 0.5 MG tablet Take 1 tablet (0.5 mg total) by mouth 2 (two) times daily as needed for anxiety.   [employer] PAIN RELIEF EXTRA STRENGTH 500 MG tablet TAKE 1 TABLET BY MOUTH EVERY 8 HOURS AS NEEDED FOR MODERATE PAIN   ezetimibe (ZETIA) 10 MG tablet Take 1 tablet (10 mg total) by mouth daily.   fluticasone (FLONASE) 50 MCG/ACT nasal spray Place 2 sprays into both nostrils daily.   hydrALAZINE (APRESOLINE) 25 MG tablet Take 1 tablet (25 mg total) by mouth 3 (three) times daily.   lamoTRIgine (LAMICTAL) 200 MG tablet TAKE 1 TABLET BY MOUTH AT  BEDTIME   nebivolol (BYSTOLIC) 10 MG tablet TAKE 1 TABLET BY MOUTH  DAILY   Potassium Citrate 15 MEQ (1620 MG) TBCR Take 1 tablet by mouth in the morning, at noon, and at bedtime.   PROAIR HFA 108 (90 Base) MCG/ACT inhaler TAKE 2 PUFFS BY MOUTH EVERY 6 HOURS AS NEEDED FOR WHEEZE OR SHORTNESS OF BREATH   spironolactone (ALDACTONE) 100 MG tablet Take 1 tablet (100 mg total) by mouth daily.   STIOLTO RESPIMAT 2.5-2.5 MCG/ACT AERS USE 2 INHALATIONS BY MOUTH  DAILY   No facility-administered encounter medications on file as of 11/22/2020.    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   Vitamin D deficiency    Past Surgical History:  Procedure Laterality Date    ABDOMINAL HYSTERECTOMY     1 ovary removed   APPENDECTOMY     APPLICATION OF WOUND VAC  11/24/2014   Procedure: APPLICATION OF WOUND VAC;  Surgeon: Donnie Mesa, MD;  Location: [facility];  Service: General;;   BOWEL RESECTION N/A 11/24/2014   Procedure: SMALL BOWEL RESECTION;  Surgeon: Donnie Mesa, MD;  Location: [facility];  Service: General;  Laterality: N/A;   BREAST EXCISIONAL BIOPSY Right 1972   BREAST SURGERY Right 40 yrs ago   benign tumor removed   LAPAROTOMY N/A 11/24/2014   Procedure: EXPLORATORY LAPAROTOMY EXTENSIVE LYSIS OF ADHESIONS SAMLL BOWEL RESECTION RIGHT HEMI COLECTOMY WOUND VAC APPLICATION. ;  Surgeon: Donnie Mesa, MD;  Location: [facility];  Service: General;  Laterality: N/A;   LYSIS OF ADHESION  11/24/2014   Procedure: LYSIS OF ADHESION (3 HRS);  Surgeon: Donnie Mesa, MD;  Location: [facility];  Service: General;;   PARTIAL COLECTOMY  11/24/2014   Procedure: RIGHT HEMI COLECTOMY;  Surgeon: Donnie Mesa, MD;  Location: [facility];  Service: General;;   SMALL INTESTINE SURGERY  age 44   Family History  Problem Relation Age of Onset   Hypertension Mother    Ulcerative colitis Mother    Crohn's disease Mother    Heart disease Mother    Kidney disease Mother    Arthritis Father    Brain cancer Father    Diabetes Father    Alcohol abuse Paternal Uncle    Diabetes Paternal Grandmother    Stroke Paternal Grandmother    Heart failure Sister    Kidney disease Sister    Heart failure Brother    Other Sister        prediabetes   Kidney cancer Brother    Social History   Socioeconomic History   Marital status: Divorced    Spouse name: Not on file   Number of children: 2   Years of education: Not on file   Highest education level: 12th grade  Occupational History   Occupation: disabled  Tobacco Use   Smoking status: Every Day    Packs/day: 1.00    Years: 35.00    Pack years: 35.00    Types: Cigarettes   Smokeless tobacco: Never   Tobacco comments:    18 cigarettes  daily 02/15/20 ARJ   Vaping Use   Vaping Use: Never used  Substance and Sexual Activity   Alcohol use: No   Drug use: No   Sexual activity: Not Currently  Other Topics Concern   Not on file  Social History Narrative   Not on file   Social Determinants of Health   Financial Resource Strain: Low Risk    Difficulty of Paying Living Expenses: Not hard at all  Food Insecurity: No Food Insecurity   Worried About Charity fundraiser in the Last Year: Never true   [place] in the Last Year: Never true  Transportation Needs: No Transportation Needs  Lack of Transportation (Medical): No   Lack of Transportation (Non-Medical): No  Physical Activity: Inactive   Days of Exercise per Week: 0 days   Minutes of Exercise per Session: 0 min  Stress: No Stress Concern Present   Feeling of Stress : Not at all  Social Connections: Moderately Integrated   Frequency of Communication with Friends and Family: More than three times a week   Frequency of Social Gatherings with Friends and Family: Once a week   Attends Religious Services: More than 4 times per year   Active Member of [employer] or Organizations: No   Attends Music therapist: More than 4 times per year   Marital Status: Divorced    Tobacco Counseling Ready to quit: Not Answered Counseling given: Not Answered Tobacco comments: 18 cigarettes daily 02/15/20 ARJ    Clinical Intake:  Pre-visit preparation completed: Yes  Pain : No/denies pain     Nutritional Risks: Other (Comment) (history of crohn's disease) Diabetes: No  How often do you need to have someone help you when you read instructions, pamphlets, or other written materials from your doctor or pharmacy?: 1 - Never What is the last grade level you completed in school?: High School Graduate  Diabetic? no  Interpreter Needed?: No  Information entered by :: April Abu, LPN   Activities of Daily Living In your present state of health, do you  have any difficulty performing the following activities: 11/22/2020 10/27/2020  Hearing? N N  Vision? N N  Difficulty concentrating or making decisions? N N  Walking or climbing stairs? N N  Dressing or bathing? N N  Doing errands, shopping? N N  Preparing Food and eating ? N -  Using the Toilet? N -  In the past six months, have you accidently leaked urine? N -  Do you have problems with loss of bowel control? N -  Managing your Medications? N -  Managing your Finances? N -  Housekeeping or managing your Housekeeping? N -  Some recent data might be hidden    Patient Care Team: Janith Lima, MD as PCP - General (Internal Medicine) Jerline Pain, MD as PCP - Cardiology (Cardiology) Charlton Haws, [facility] as Pharmacist (Pharmacist)  Indicate any recent Medical Services you may have received from other than [facility] providers in the past year (date may be approximate).     Assessment:   This is a routine wellness examination for [place].  Hearing/Vision screen Hearing Screening - Comments:: Patient denied any hearing difficulty. Vision Screening - Comments:: Patient wears glasses to read.  Eye exam done once a year by [facility].  Dietary issues and exercise activities discussed: Exercise limited by: psychological condition(s);respiratory conditions(s)   Goals Addressed   None   Depression Screen PHQ 2/9 Scores 11/22/2020 10/27/2020 09/20/2020 06/21/2020 01/04/2020 10/28/2019 07/28/2019  PHQ - 2 Score 0 0 2 0 2 3 2   PHQ- 9 Score 0 0 7 0 7 12 7     Fall Risk Fall Risk  11/22/2020 10/27/2020 01/04/2020 01/27/2019 01/31/2017  Falls in the past year? 0 0 0 0 No  Number falls in past yr: 0 0 - 0 -  Injury with Fall? 0 0 0 0 -  Risk for fall due to : No Fall Risks - No Fall Risks - -  Follow up Falls evaluation completed - Falls evaluation completed Falls evaluation completed -    FALL RISK PREVENTION PERTAINING TO THE HOME:  Any stairs in or around the  home? Yes  If  so, are there any without handrails? No  Home free of loose throw rugs in walkways, pet beds, electrical cords, etc? Yes  Adequate lighting in your home to reduce risk of falls? Yes   ASSISTIVE DEVICES UTILIZED TO PREVENT FALLS:  Life alert? No  Use of a cane, walker or w/c? No  Grab bars in the bathroom? No  Shower chair or bench in shower? No  Elevated toilet seat or a handicapped toilet? No   TIMED UP AND GO:  Was the test performed? No .  Length of time to ambulate 10 feet: 0 sec.   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 No flowsheet data found.         Immunizations Immunization History  Administered Date(s) Administered   Influenza Split 04/04/2011, 02/15/2012   Influenza Whole 04/30/2008, 04/01/2009   Influenza,inj,Quad PF,6+ Mos 02/24/2015, 03/21/2016, 01/31/2017, 01/08/2018, 01/08/2019, 01/22/2020   PFIZER(Purple Top)SARS-COV-2 Vaccination 07/18/2019, 08/08/2019, 03/04/2020   PPD Test 10/16/2013, 03/05/2018   Pneumococcal Conjugate-13 02/24/2015   Pneumococcal Polysaccharide-23 02/15/2012, 08/20/2017   Td 12/31/2008   Tdap 10/16/2013    TDAP status: Up to date  Flu Vaccine status: Up to date  Pneumococcal vaccine status: Up to date  Covid-19 vaccine status: Completed vaccines  Qualifies for Shingles Vaccine? Yes   Zostavax completed No   Shingrix Completed?: No.    Education has been provided regarding the importance of this vaccine. Patient has been advised to call insurance company to determine out of pocket expense if they have not yet received this vaccine. Advised may also receive vaccine at local pharmacy or Health Dept. Verbalized acceptance and understanding.  Screening Tests Health Maintenance  Topic Date Due   Zoster Vaccines- Shingrix (1 of 2) Never done   COVID-19 Vaccine (4 - Booster for Pfizer series) 06/04/2020   INFLUENZA VACCINE  12/12/2020    HEMOGLOBIN A1C  12/19/2020   FOOT EXAM  06/24/2021   OPHTHALMOLOGY EXAM  09/23/2021   MAMMOGRAM  10/29/2021   PNA vac Low Risk Adult (2 of 2 - PPSV23) 08/21/2022   COLONOSCOPY (Pts 45-21yr Insurance coverage will need to be confirmed)  09/29/2022   TETANUS/TDAP  10/17/2023   DEXA SCAN  Completed   Hepatitis C Screening  Completed   HIV Screening  Completed   HPV VACCINES  Aged Out    Health Maintenance  Health Maintenance Due  Topic Date Due   Zoster Vaccines- Shingrix (1 of 2) Never done   COVID-19 Vaccine (4 - Booster for Pfizer series) 06/04/2020    Colorectal cancer screening: Type of screening: Colonoscopy. Completed 09/28/2017. Repeat every 5 years  Mammogram status: Completed 10/30/2019. Repeat every year  Bone Density status: Completed 12/30/2018. Results reflect: Bone density results: NORMAL. Repeat every 5 years.  Lung Cancer Screening: (Low Dose CT Chest recommended if Age 65-80years, 30 pack-year currently smoking OR have quit w/in 15years.) does qualify.   Lung Cancer Screening Referral: no  Additional Screening:  Hepatitis C Screening: does qualify; Completed yes  Vision Screening: Recommended annual ophthalmology exams for early detection of glaucoma and other disorders of the eye. Is the patient up to date with their annual eye exam?  Yes  Who is the provider or what is the name of the office in which the patient [facility]  If pt is not established with a provider, would they like to be referred  to a provider to establish care? No .   Dental Screening: Recommended annual dental exams for proper oral hygiene  Community Resource Referral / Chronic Care Management: CRR required this visit?  No   CCM required this visit?  No      Plan:     I have personally reviewed and noted the following in the patient's chart:   Medical and social history Use of alcohol, tobacco or illicit drugs  Current medications and  supplements including opioid prescriptions.  Functional ability and status Nutritional status Physical activity Advanced directives List of other physicians Hospitalizations, surgeries, and ER visits in previous 12 months Vitals Screenings to include cognitive, depression, and falls Referrals and appointments  In addition, I have reviewed and discussed with patient certain preventive protocols, quality metrics, and best practice recommendations. A written personalized care plan for preventive services as well as general preventive health recommendations were provided to patient.     Sheral Flow, LPN   [id number]   Nurse Notes:  There were no vitals filed for this visit. There is no height or weight on file to calculate BMI. Patient stated that she has no issues with gait or balance; does not use any assistive devices.

## 2020-11-22 NOTE — Patient Instructions (Addendum)
April May , Thank you for taking time to come for your Medicare Wellness Visit. I appreciate your ongoing commitment to your health goals. Please review the following plan we discussed and let me know if I can assist you in the future.   Screening recommendations/referrals: Colonoscopy: last done 09/28/2017; due every 5 years  Mammogram: last done 10/30/2019; scheduled for 12/19/2020 Bone Density: last done 12/30/2018; due every 5 years; normal results Recommended yearly ophthalmology/optometry visit for glaucoma screening and checkup Recommended yearly dental visit for hygiene and checkup  Vaccinations: Influenza vaccine: 01/22/2020 Pneumococcal vaccine: 02/24/2015, 08/20/2017 Tdap vaccine: 10/16/2013; due every 10 years Shingles vaccine: never done; Please call your insurance company to determine your out of pocket expense for the Shingrix vaccine. You may receive this vaccine at your local pharmacy. Covid-19: 07/18/2019, 08/08/2019, 03/04/2020  Advanced directives: Advance directive discussed with you today. Even though you declined this today please call our office should you change your mind and we can give you the proper paperwork for you to fill out.  Conditions/risks identified: Client understands the importance of follow-up with providers by attending scheduled visits and discussed goals to eat healthier, increase physical activity, exercise the brain, socialize more,  get enough rest and make time for laughter.  Next appointment: Please schedule your next Medicare Wellness Visit with your Nurse Health Advisor in 1 year by calling [phone].   Preventive Care 26 Years and Older, Female Preventive care refers to lifestyle choices and visits with your health care provider that can promote health and wellness. What does preventive care include? A yearly physical exam. This is also called an annual well check. Dental exams once or twice a year. Routine eye exams. Ask your health care  provider how often you should have your eyes checked. Personal lifestyle choices, including: Daily care of your teeth and gums. Regular physical activity. Eating a healthy diet. Avoiding tobacco and drug use. Limiting alcohol use. Practicing safe sex. Taking low-dose aspirin every day. Taking vitamin and mineral supplements as recommended by your health care provider. What happens during an annual well check? The services and screenings done by your health care provider during your annual well check will depend on your age, overall health, lifestyle risk factors, and family history of disease. Counseling  Your health care provider may ask you questions about your: Alcohol use. Tobacco use. Drug use. Emotional well-being. Home and relationship well-being. Sexual activity. Eating habits. History of falls. Memory and ability to understand (cognition). Work and work Statistician. Reproductive health. Screening  You may have the following tests or measurements: Height, weight, and BMI. Blood pressure. Lipid and cholesterol levels. These may be checked every 5 years, or more frequently if you are over 25 years old. Skin check. Lung cancer screening. You may have this screening every year starting at age 46 if you have a 30-pack-year history of smoking and currently smoke or have quit within the past 15 years. Fecal occult blood test (FOBT) of the stool. You may have this test every year starting at age 4. Flexible sigmoidoscopy or colonoscopy. You may have a sigmoidoscopy every 5 years or a colonoscopy every 10 years starting at age 31. Hepatitis C blood test. Hepatitis B blood test. Sexually transmitted disease (STD) testing. Diabetes screening. This is done by checking your blood sugar (glucose) after you have not eaten for a while (fasting). You may have this done every 1-3 years. Bone density scan. This is done to screen for osteoporosis. You may have this done  starting at age  86. Mammogram. This may be done every 1-2 years. Talk to your health care provider about how often you should have regular mammograms. Talk with your health care provider about your test results, treatment options, and if necessary, the need for more tests. Vaccines  Your health care provider may recommend certain vaccines, such as: Influenza vaccine. This is recommended every year. Tetanus, diphtheria, and acellular pertussis (Tdap, Td) vaccine. You may need a Td booster every 10 years. Zoster vaccine. You may need this after age 66. Pneumococcal 13-valent conjugate (PCV13) vaccine. One dose is recommended after age 58. Pneumococcal polysaccharide (PPSV23) vaccine. One dose is recommended after age 51. Talk to your health care provider about which screenings and vaccines you need and how often you need them. This information is not intended to replace advice given to you by your health care provider. Make sure you discuss any questions you have with your health care provider. Document Released: 05/27/2015 Document Revised: 01/18/2016 Document Reviewed: 03/01/2015 Elsevier Interactive Patient Education  2017 [place] Prevention in the Home Falls can cause injuries. They can happen to people of all ages. There are many things you can do to make your home safe and to help prevent falls. What can I do on the outside of my home? Regularly fix the edges of walkways and driveways and fix any cracks. Remove anything that might make you trip as you walk through a door, such as a raised step or threshold. Trim any bushes or trees on the path to your home. Use bright outdoor lighting. Clear any walking paths of anything that might make someone trip, such as rocks or tools. Regularly check to see if handrails are loose or broken. Make sure that both sides of any steps have handrails. Any raised decks and porches should have guardrails on the edges. Have any leaves, snow, or ice cleared  regularly. Use sand or salt on walking paths during winter. Clean up any spills in your garage right away. This includes oil or grease spills. What can I do in the bathroom? Use night lights. Install grab bars by the toilet and in the tub and shower. Do not use towel bars as grab bars. Use non-skid mats or decals in the tub or shower. If you need to sit down in the shower, use a plastic, non-slip stool. Keep the floor dry. Clean up any water that spills on the floor as soon as it happens. Remove soap buildup in the tub or shower regularly. Attach bath mats securely with double-sided non-slip rug tape. Do not have throw rugs and other things on the floor that can make you trip. What can I do in the bedroom? Use night lights. Make sure that you have a light by your bed that is easy to reach. Do not use any sheets or blankets that are too big for your bed. They should not hang down onto the floor. Have a firm chair that has side arms. You can use this for support while you get dressed. Do not have throw rugs and other things on the floor that can make you trip. What can I do in the kitchen? Clean up any spills right away. Avoid walking on wet floors. Keep items that you use a lot in easy-to-reach places. If you need to reach something above you, use a strong step stool that has a grab bar. Keep electrical cords out of the way. Do not use floor polish or wax that  makes floors slippery. If you must use wax, use non-skid floor wax. Do not have throw rugs and other things on the floor that can make you trip. What can I do with my stairs? Do not leave any items on the stairs. Make sure that there are handrails on both sides of the stairs and use them. Fix handrails that are broken or loose. Make sure that handrails are as long as the stairways. Check any carpeting to make sure that it is firmly attached to the stairs. Fix any carpet that is loose or worn. Avoid having throw rugs at the top or  bottom of the stairs. If you do have throw rugs, attach them to the floor with carpet tape. Make sure that you have a light switch at the top of the stairs and the bottom of the stairs. If you do not have them, ask someone to add them for you. What else can I do to help prevent falls? Wear shoes that: Do not have high heels. Have rubber bottoms. Are comfortable and fit you well. Are closed at the toe. Do not wear sandals. If you use a stepladder: Make sure that it is fully opened. Do not climb a closed stepladder. Make sure that both sides of the stepladder are locked into place. Ask someone to hold it for you, if possible. Clearly mark and make sure that you can see: Any grab bars or handrails. First and last steps. Where the edge of each step is. Use tools that help you move around (mobility aids) if they are needed. These include: Canes. Walkers. Scooters. Crutches. Turn on the lights when you go into a dark area. Replace any light bulbs as soon as they burn out. Set up your furniture so you have a clear path. Avoid moving your furniture around. If any of your floors are uneven, fix them. If there are any pets around you, be aware of where they are. Review your medicines with your doctor. Some medicines can make you feel dizzy. This can increase your chance of falling. Ask your doctor what other things that you can do to help prevent falls. This information is not intended to replace advice given to you by your health care provider. Make sure you discuss any questions you have with your health care provider. Document Released: 02/24/2009 Document Revised: 10/06/2015 Document Reviewed: 06/04/2014 Elsevier Interactive Patient Education  2017 [employer].

## 2020-11-23 ENCOUNTER — Ambulatory Visit (INDEPENDENT_AMBULATORY_CARE_PROVIDER_SITE_OTHER): Payer: Medicare Other

## 2020-11-23 ENCOUNTER — Other Ambulatory Visit: Payer: Self-pay

## 2020-11-23 DIAGNOSIS — E538 Deficiency of other specified B group vitamins: Secondary | ICD-10-CM | POA: Diagnosis not present

## 2020-11-23 MED ORDER — CYANOCOBALAMIN 1000 MCG/ML IJ SOLN
1000.0000 ug | Freq: Once | INTRAMUSCULAR | Status: AC
  Administered 2020-11-23: 1000 ug via INTRAMUSCULAR

## 2020-11-23 NOTE — Progress Notes (Signed)
Vitamin B12  given w/o any complications.

## 2020-11-28 ENCOUNTER — Other Ambulatory Visit: Payer: Self-pay | Admitting: Internal Medicine

## 2020-11-28 DIAGNOSIS — E559 Vitamin D deficiency, unspecified: Secondary | ICD-10-CM

## 2020-11-28 MED ORDER — D3-50 1.25 MG (50000 UT) PO CAPS: ORAL_CAPSULE | ORAL | 0 refills | Status: DC

## 2020-11-30 ENCOUNTER — Other Ambulatory Visit: Payer: Self-pay | Admitting: Internal Medicine

## 2020-11-30 DIAGNOSIS — E559 Vitamin D deficiency, unspecified: Secondary | ICD-10-CM

## 2020-12-05 ENCOUNTER — Encounter: Payer: Self-pay | Admitting: Internal Medicine

## 2020-12-05 ENCOUNTER — Other Ambulatory Visit: Payer: Self-pay | Admitting: Internal Medicine

## 2020-12-05 DIAGNOSIS — E559 Vitamin D deficiency, unspecified: Secondary | ICD-10-CM

## 2020-12-05 NOTE — Telephone Encounter (Signed)
Per patient the pharmacy is saying the order for D3. According to the pharmacy it needs to be listed as "Cholecalciferol (D3-50) 1.25 MG (50000 UT) capsule" in order for them to fill it like the previous prescriptions.   Please advise.

## 2020-12-07 ENCOUNTER — Other Ambulatory Visit: Payer: Self-pay | Admitting: Gastroenterology

## 2020-12-08 ENCOUNTER — Ambulatory Visit (INDEPENDENT_AMBULATORY_CARE_PROVIDER_SITE_OTHER): Payer: Medicare Other | Admitting: Gastroenterology

## 2020-12-08 ENCOUNTER — Encounter: Payer: Self-pay | Admitting: Gastroenterology

## 2020-12-08 VITALS — BP 144/82 | HR 56 | Ht 60.0 in | Wt 232.0 lb

## 2020-12-08 DIAGNOSIS — G4733 Obstructive sleep apnea (adult) (pediatric): Secondary | ICD-10-CM | POA: Diagnosis not present

## 2020-12-08 DIAGNOSIS — K5000 Crohn's disease of small intestine without complications: Secondary | ICD-10-CM

## 2020-12-08 MED ORDER — BUDESONIDE 3 MG PO CPEP: 9.0000 mg | ORAL_CAPSULE | Freq: Every day | ORAL | 3 refills | Status: DC

## 2020-12-08 NOTE — Progress Notes (Signed)
    History of Present Illness: This is a 65 year old female with Crohn's ileitis.  She related 2-3 soft to loose stools daily. This is a substantial improvement from her prior history of 6-8 urgent loose stools today with occasional incontinence.  She no longer has any episodes of incontinence.  Current Medications, Allergies, Past Medical History, Past Surgical History, Family History and Social History were reviewed in [employer] record.   Physical Exam: General: Well developed, well nourished, no acute distress Head: Normocephalic and atraumatic Eyes: Sclerae anicteric, EOMI Ears: Normal auditory acuity Mouth: Not examined, mask on during Covid-19 pandemic Lungs: Clear throughout to auscultation Heart: Regular rate and rhythm; no murmurs, rubs or bruits Abdomen: Soft, non tender and non distended. No masses, hepatosplenomegaly or hernias noted. Normal Bowel sounds Rectal: Not done Musculoskeletal: Symmetrical with no gross deformities  Pulses:  Normal pulses noted Extremities: No clubbing, cyanosis, edema or deformities noted Neurological: Alert oriented x 4, grossly nonfocal Psychological:  Alert and cooperative. Normal mood and affect   Assessment and Recommendations:  Crohn's ileitis.  Status post TI and right colon resection in 2016.  Her symptoms have been controlled on budesonide.  Continue B12 replacement per PCP.  I again discussed with her the recommendation to change to 6-MP or a biologic however she is not comfortable making any changes at this time.  Blood work recently performed by Dr. Ronnald Ramp was reviewed and was unremarkable. REV in 1 year.

## 2020-12-08 NOTE — Patient Instructions (Signed)
We have sent the following prescriptions to your mail in pharmacy: budesonide.   If you have not heard from your mail in pharmacy within 1 week or if you have not received your medication in the mail, please contact us at [phone] so we may find out why.  Due to recent changes in healthcare laws, you may see the results of your imaging and laboratory studies on MyChart before your provider has had a chance to review them.  We understand that in some cases there may be results that are confusing or concerning to you. Not all laboratory results come back in the same time frame and the provider may be waiting for multiple results in order to interpret others.  Please give [place] 48 hours in order for your provider to thoroughly review all the results before contacting the office for clarification of your results.   The [facility] providers would like to encourage you to use [facility] to communicate with providers for non-urgent requests or questions.  Due to long hold times on the telephone, sending your provider a message by [facility] may be a faster and more efficient way to get a response.  Please allow 48 business hours for a response.  Please remember that this is for non-urgent requests.   Thank you for choosing me and [facility].  Pricilla Riffle. Dagoberto Ligas., MD., Marval Regal

## 2020-12-13 ENCOUNTER — Other Ambulatory Visit: Payer: Medicare Other

## 2020-12-14 ENCOUNTER — Other Ambulatory Visit: Payer: Medicare Other | Admitting: *Deleted

## 2020-12-14 ENCOUNTER — Other Ambulatory Visit: Payer: Self-pay

## 2020-12-14 DIAGNOSIS — E782 Mixed hyperlipidemia: Secondary | ICD-10-CM

## 2020-12-15 LAB — NMR, LIPOPROFILE
Cholesterol, Total: 111 mg/dL (ref 100–199)
HDL Particle Number: 25 umol/L — ABNORMAL LOW (ref >=30.5)
HDL-C: 46 mg/dL (ref >39)
LDL Particle Number: 666 nmol/L (ref <1000)
LDL Size: 20.9 nm (ref >20.5)
LDL-C (NIH Calc): 44 mg/dL (ref 0–99)
LP-IR Score: 45 (ref <=45)
Small LDL Particle Number: 319 nmol/L (ref <=527)
Triglycerides: 114 mg/dL (ref 0–149)

## 2020-12-15 LAB — HEPATIC FUNCTION PANEL
ALT: 19 IU/L (ref 0–32)
AST: 14 IU/L (ref 0–40)
Albumin: 4.4 g/dL (ref 3.8–4.8)
Alkaline Phosphatase: 52 IU/L (ref 44–121)
Bilirubin Total: 0.7 mg/dL (ref 0.0–1.2)
Bilirubin, Direct: 0.16 mg/dL (ref 0.00–0.40)
Total Protein: 6.8 g/dL (ref 6.0–8.5)

## 2020-12-15 LAB — LIPOPROTEIN A (LPA): Lipoprotein (a): 82 nmol/L — ABNORMAL HIGH (ref <75.0)

## 2020-12-15 LAB — APOLIPOPROTEIN B: Apolipoprotein B: 51 mg/dL (ref <90)

## 2020-12-16 ENCOUNTER — Ambulatory Visit (INDEPENDENT_AMBULATORY_CARE_PROVIDER_SITE_OTHER): Payer: Medicare Other

## 2020-12-16 ENCOUNTER — Other Ambulatory Visit: Payer: Self-pay

## 2020-12-16 DIAGNOSIS — D225 Melanocytic nevi of trunk: Secondary | ICD-10-CM | POA: Diagnosis not present

## 2020-12-16 DIAGNOSIS — Z1283 Encounter for screening for malignant neoplasm of skin: Secondary | ICD-10-CM | POA: Diagnosis not present

## 2020-12-16 DIAGNOSIS — Z111 Encounter for screening for respiratory tuberculosis: Secondary | ICD-10-CM | POA: Diagnosis not present

## 2020-12-16 DIAGNOSIS — L818 Other specified disorders of pigmentation: Secondary | ICD-10-CM | POA: Diagnosis not present

## 2020-12-16 NOTE — Progress Notes (Signed)
TB skin Test has been done pt has been made aware to com back on Monday after 10:30am.  Pt was informed she has to return on Monday 12/19/2020 after 10:30 am.

## 2020-12-18 ENCOUNTER — Other Ambulatory Visit: Payer: Self-pay | Admitting: Internal Medicine

## 2020-12-18 DIAGNOSIS — I10 Essential (primary) hypertension: Secondary | ICD-10-CM

## 2020-12-19 ENCOUNTER — Telehealth: Payer: Self-pay | Admitting: Pharmacist

## 2020-12-19 ENCOUNTER — Ambulatory Visit
Admission: RE | Admit: 2020-12-19 | Discharge: 2020-12-19 | Disposition: A | Discharge status: Home / Self Care | Payer: Medicare Other | Source: Ambulatory Visit | Attending: Internal Medicine | Admitting: Internal Medicine

## 2020-12-19 ENCOUNTER — Other Ambulatory Visit: Payer: Self-pay

## 2020-12-19 DIAGNOSIS — Z1231 Encounter for screening mammogram for malignant neoplasm of breast: Secondary | ICD-10-CM | POA: Diagnosis not present

## 2020-12-19 LAB — TB SKIN TEST
Induration: 48 mm
TB Skin Test: NEGATIVE

## 2020-12-19 NOTE — Telephone Encounter (Signed)
Called pt and left message to discuss advanced lipid panel results.  Targeting more aggressive LDL goal 40-50s given mildly elevated Lp(a) and coronary artery calcifications noted on lung cancer CT that developed despite baseline LDL in the 40-50s. LDL has improved from 58 to 44 after adding ezetimibe which is a 24% LDL reduction. She is intolerant to atorvastatin 64m daily, rosuvastatin 19mdaily, and Livalo 76m56maily. Insurance will not cover PCSK9i or Nexletol since her LDL is < 70. Will continue ezetimibe 72m46mily.

## 2020-12-20 NOTE — Telephone Encounter (Signed)
Spoke with pt regarding lab results. She is agreeable to continuing ezetimibe 59m daily and does not wish to rechallenge with statin therapy. Encouraged pt to focus on weight loss and heart healthy diet to help bring LDL < 40.

## 2020-12-22 DIAGNOSIS — K5090 Crohn's disease, unspecified, without complications: Secondary | ICD-10-CM | POA: Diagnosis not present

## 2020-12-22 DIAGNOSIS — E785 Hyperlipidemia, unspecified: Secondary | ICD-10-CM | POA: Diagnosis not present

## 2020-12-22 DIAGNOSIS — E876 Hypokalemia: Secondary | ICD-10-CM | POA: Diagnosis not present

## 2020-12-22 DIAGNOSIS — I10 Essential (primary) hypertension: Secondary | ICD-10-CM | POA: Diagnosis not present

## 2020-12-29 ENCOUNTER — Other Ambulatory Visit: Payer: Self-pay

## 2020-12-29 ENCOUNTER — Ambulatory Visit (INDEPENDENT_AMBULATORY_CARE_PROVIDER_SITE_OTHER): Payer: Medicare Other

## 2020-12-29 DIAGNOSIS — E538 Deficiency of other specified B group vitamins: Secondary | ICD-10-CM

## 2020-12-29 MED ORDER — CYANOCOBALAMIN 1000 MCG/ML IJ SOLN
1000.0000 ug | Freq: Once | INTRAMUSCULAR | Status: AC
  Administered 2020-12-29: 1000 ug via INTRAMUSCULAR

## 2020-12-29 NOTE — Progress Notes (Signed)
B12 given w/o any complications.

## 2020-12-30 DIAGNOSIS — Z23 Encounter for immunization: Secondary | ICD-10-CM | POA: Diagnosis not present

## 2021-01-03 ENCOUNTER — Encounter: Payer: Self-pay | Admitting: Internal Medicine

## 2021-01-04 ENCOUNTER — Telehealth: Payer: Self-pay | Admitting: Internal Medicine

## 2021-01-05 ENCOUNTER — Other Ambulatory Visit: Payer: Self-pay | Admitting: Internal Medicine

## 2021-01-08 DIAGNOSIS — G4733 Obstructive sleep apnea (adult) (pediatric): Secondary | ICD-10-CM | POA: Diagnosis not present

## 2021-01-14 ENCOUNTER — Other Ambulatory Visit: Payer: Self-pay | Admitting: Internal Medicine

## 2021-01-14 DIAGNOSIS — F411 Generalized anxiety disorder: Secondary | ICD-10-CM

## 2021-01-20 ENCOUNTER — Telehealth: Payer: Self-pay

## 2021-01-22 ENCOUNTER — Other Ambulatory Visit: Payer: Self-pay | Admitting: Internal Medicine

## 2021-01-22 DIAGNOSIS — E876 Hypokalemia: Secondary | ICD-10-CM

## 2021-01-22 DIAGNOSIS — I10 Essential (primary) hypertension: Secondary | ICD-10-CM

## 2021-01-22 DIAGNOSIS — T502X5A Adverse effect of carbonic-anhydrase inhibitors, benzothiadiazides and other diuretics, initial encounter: Secondary | ICD-10-CM

## 2021-01-23 ENCOUNTER — Other Ambulatory Visit: Payer: Self-pay | Admitting: Internal Medicine

## 2021-01-23 DIAGNOSIS — T502X5A Adverse effect of carbonic-anhydrase inhibitors, benzothiadiazides and other diuretics, initial encounter: Secondary | ICD-10-CM

## 2021-01-23 DIAGNOSIS — I10 Essential (primary) hypertension: Secondary | ICD-10-CM

## 2021-01-23 DIAGNOSIS — E876 Hypokalemia: Secondary | ICD-10-CM

## 2021-01-23 MED ORDER — SPIRONOLACTONE 100 MG PO TABS: 100.0000 mg | ORAL_TABLET | Freq: Every day | ORAL | 0 refills | Status: DC

## 2021-01-30 ENCOUNTER — Ambulatory Visit (INDEPENDENT_AMBULATORY_CARE_PROVIDER_SITE_OTHER): Payer: Medicare Other

## 2021-01-30 ENCOUNTER — Other Ambulatory Visit: Payer: Self-pay

## 2021-01-30 DIAGNOSIS — Z23 Encounter for immunization: Secondary | ICD-10-CM

## 2021-01-30 DIAGNOSIS — E538 Deficiency of other specified B group vitamins: Secondary | ICD-10-CM

## 2021-01-30 MED ORDER — CYANOCOBALAMIN 1000 MCG/ML IJ SOLN
1000.0000 ug | Freq: Once | INTRAMUSCULAR | Status: AC
  Administered 2021-01-30: 1000 ug via INTRAMUSCULAR

## 2021-02-01 NOTE — Progress Notes (Signed)
I have reviewed and agree.

## 2021-02-06 ENCOUNTER — Other Ambulatory Visit: Payer: Self-pay | Admitting: Internal Medicine

## 2021-02-06 DIAGNOSIS — F321 Major depressive disorder, single episode, moderate: Secondary | ICD-10-CM

## 2021-02-08 DIAGNOSIS — G4733 Obstructive sleep apnea (adult) (pediatric): Secondary | ICD-10-CM | POA: Diagnosis not present

## 2021-02-14 ENCOUNTER — Other Ambulatory Visit: Payer: Self-pay | Admitting: Internal Medicine

## 2021-02-14 DIAGNOSIS — E559 Vitamin D deficiency, unspecified: Secondary | ICD-10-CM

## 2021-03-01 ENCOUNTER — Ambulatory Visit (INDEPENDENT_AMBULATORY_CARE_PROVIDER_SITE_OTHER): Payer: Medicare Other

## 2021-03-01 ENCOUNTER — Other Ambulatory Visit: Payer: Self-pay

## 2021-03-01 ENCOUNTER — Telehealth: Payer: Self-pay

## 2021-03-01 DIAGNOSIS — E538 Deficiency of other specified B group vitamins: Secondary | ICD-10-CM | POA: Diagnosis not present

## 2021-03-01 MED ORDER — CYANOCOBALAMIN 1000 MCG/ML IJ SOLN
1000.0000 ug | Freq: Once | INTRAMUSCULAR | Status: AC
  Administered 2021-03-01: 1000 ug via INTRAMUSCULAR

## 2021-03-01 NOTE — Progress Notes (Signed)
Pt here for monthly B12 injection per Dr. Ronnald Ramp  B12 1029mg given IM, and pt tolerated injection well.  Next B12 injection scheduled for 04/05/21

## 2021-03-01 NOTE — Telephone Encounter (Signed)
Please Advise  Patient came into office for her monthly b-12 injection. She states that she would prefer to take b-12 oral supplements. Patient would like to know what her b-12 levels are. The last b-12 was done in 2020 and levels were >1500.

## 2021-03-05 ENCOUNTER — Other Ambulatory Visit: Payer: Self-pay | Admitting: Internal Medicine

## 2021-03-05 DIAGNOSIS — E538 Deficiency of other specified B group vitamins: Secondary | ICD-10-CM

## 2021-03-05 MED ORDER — CYANOCOBALAMIN 2000 MCG PO TABS: 2000.0000 ug | ORAL_TABLET | Freq: Every day | ORAL | 1 refills | Status: DC

## 2021-03-06 ENCOUNTER — Telehealth: Payer: Self-pay | Admitting: Internal Medicine

## 2021-03-06 ENCOUNTER — Encounter: Payer: Self-pay | Admitting: Internal Medicine

## 2021-03-06 ENCOUNTER — Other Ambulatory Visit: Payer: Self-pay | Admitting: *Deleted

## 2021-03-06 DIAGNOSIS — Z87891 Personal history of nicotine dependence: Secondary | ICD-10-CM

## 2021-03-06 DIAGNOSIS — F17210 Nicotine dependence, cigarettes, uncomplicated: Secondary | ICD-10-CM

## 2021-03-06 NOTE — Telephone Encounter (Signed)
Pt. Has called and asked if she buy B3 Vitamin OTC, opposed to taking Cholecalciferol (VITAMIN D3) 1.25 MG (50000 UT) CAPS. States that she believes OTC medication may be cheaper.   Callback #- [phone]

## 2021-03-06 NOTE — Telephone Encounter (Signed)
Called and spoke with pt, she verbalized understanding.

## 2021-03-07 NOTE — Telephone Encounter (Signed)
Spoke with and informed her that Dr. Ronnald Ramp has Ok'd for her to take the OTC D3. Pt states she will try the 2000 units once a day to see if it helps.

## 2021-03-10 ENCOUNTER — Other Ambulatory Visit: Payer: Self-pay | Admitting: Internal Medicine

## 2021-03-10 DIAGNOSIS — I10 Essential (primary) hypertension: Secondary | ICD-10-CM

## 2021-03-10 DIAGNOSIS — E876 Hypokalemia: Secondary | ICD-10-CM

## 2021-03-10 DIAGNOSIS — G4733 Obstructive sleep apnea (adult) (pediatric): Secondary | ICD-10-CM | POA: Diagnosis not present

## 2021-03-11 ENCOUNTER — Other Ambulatory Visit: Payer: Self-pay | Admitting: Internal Medicine

## 2021-03-11 DIAGNOSIS — T502X5A Adverse effect of carbonic-anhydrase inhibitors, benzothiadiazides and other diuretics, initial encounter: Secondary | ICD-10-CM

## 2021-03-11 DIAGNOSIS — E876 Hypokalemia: Secondary | ICD-10-CM

## 2021-03-11 DIAGNOSIS — I10 Essential (primary) hypertension: Secondary | ICD-10-CM

## 2021-03-15 DIAGNOSIS — G4733 Obstructive sleep apnea (adult) (pediatric): Secondary | ICD-10-CM | POA: Diagnosis not present

## 2021-03-25 ENCOUNTER — Other Ambulatory Visit: Payer: Self-pay | Admitting: Internal Medicine

## 2021-03-25 DIAGNOSIS — I10 Essential (primary) hypertension: Secondary | ICD-10-CM

## 2021-03-25 DIAGNOSIS — F09 Unspecified mental disorder due to known physiological condition: Secondary | ICD-10-CM

## 2021-03-25 DIAGNOSIS — F32A Unspecified mental disorder due to known physiological condition: Secondary | ICD-10-CM

## 2021-03-28 DIAGNOSIS — H1132 Conjunctival hemorrhage, left eye: Secondary | ICD-10-CM | POA: Diagnosis not present

## 2021-03-28 DIAGNOSIS — I10 Essential (primary) hypertension: Secondary | ICD-10-CM | POA: Diagnosis not present

## 2021-04-05 ENCOUNTER — Ambulatory Visit: Payer: Medicare Other

## 2021-04-10 DIAGNOSIS — G4733 Obstructive sleep apnea (adult) (pediatric): Secondary | ICD-10-CM | POA: Diagnosis not present

## 2021-05-02 ENCOUNTER — Ambulatory Visit: Payer: Medicare Other | Admitting: Family Medicine

## 2021-05-04 DIAGNOSIS — L720 Epidermal cyst: Secondary | ICD-10-CM | POA: Diagnosis not present

## 2021-05-09 ENCOUNTER — Other Ambulatory Visit: Payer: Self-pay | Admitting: Internal Medicine

## 2021-05-09 DIAGNOSIS — I10 Essential (primary) hypertension: Secondary | ICD-10-CM

## 2021-05-09 DIAGNOSIS — F321 Major depressive disorder, single episode, moderate: Secondary | ICD-10-CM

## 2021-05-09 DIAGNOSIS — T502X5A Adverse effect of carbonic-anhydrase inhibitors, benzothiadiazides and other diuretics, initial encounter: Secondary | ICD-10-CM

## 2021-05-10 DIAGNOSIS — G4733 Obstructive sleep apnea (adult) (pediatric): Secondary | ICD-10-CM | POA: Diagnosis not present

## 2021-05-17 ENCOUNTER — Encounter: Payer: Self-pay | Admitting: Internal Medicine

## 2021-06-08 DIAGNOSIS — G4733 Obstructive sleep apnea (adult) (pediatric): Secondary | ICD-10-CM | POA: Diagnosis not present

## 2021-06-14 ENCOUNTER — Other Ambulatory Visit: Payer: Self-pay | Admitting: Internal Medicine

## 2021-06-14 DIAGNOSIS — I10 Essential (primary) hypertension: Secondary | ICD-10-CM

## 2021-06-16 ENCOUNTER — Other Ambulatory Visit: Payer: Self-pay | Admitting: Internal Medicine

## 2021-06-16 DIAGNOSIS — I10 Essential (primary) hypertension: Secondary | ICD-10-CM

## 2021-06-16 DIAGNOSIS — E876 Hypokalemia: Secondary | ICD-10-CM

## 2021-06-16 DIAGNOSIS — T502X5A Adverse effect of carbonic-anhydrase inhibitors, benzothiadiazides and other diuretics, initial encounter: Secondary | ICD-10-CM

## 2021-06-21 ENCOUNTER — Ambulatory Visit (INDEPENDENT_AMBULATORY_CARE_PROVIDER_SITE_OTHER): Payer: Medicare Other | Admitting: Internal Medicine

## 2021-06-21 ENCOUNTER — Encounter: Payer: Self-pay | Admitting: Internal Medicine

## 2021-06-21 ENCOUNTER — Other Ambulatory Visit: Payer: Self-pay

## 2021-06-21 VITALS — BP 132/84 | HR 65 | Temp 98.7°F | Resp 16 | Ht 60.0 in | Wt 226.0 lb

## 2021-06-21 DIAGNOSIS — Z716 Tobacco abuse counseling: Secondary | ICD-10-CM | POA: Insufficient documentation

## 2021-06-21 DIAGNOSIS — E785 Hyperlipidemia, unspecified: Secondary | ICD-10-CM

## 2021-06-21 DIAGNOSIS — F321 Major depressive disorder, single episode, moderate: Secondary | ICD-10-CM

## 2021-06-21 DIAGNOSIS — E559 Vitamin D deficiency, unspecified: Secondary | ICD-10-CM | POA: Diagnosis not present

## 2021-06-21 DIAGNOSIS — F17200 Nicotine dependence, unspecified, uncomplicated: Secondary | ICD-10-CM

## 2021-06-21 DIAGNOSIS — I10 Essential (primary) hypertension: Secondary | ICD-10-CM

## 2021-06-21 DIAGNOSIS — R1310 Dysphagia, unspecified: Secondary | ICD-10-CM | POA: Insufficient documentation

## 2021-06-21 DIAGNOSIS — F3342 Major depressive disorder, recurrent, in full remission: Secondary | ICD-10-CM

## 2021-06-21 DIAGNOSIS — Z23 Encounter for immunization: Secondary | ICD-10-CM | POA: Diagnosis not present

## 2021-06-21 DIAGNOSIS — Z0001 Encounter for general adult medical examination with abnormal findings: Secondary | ICD-10-CM | POA: Diagnosis not present

## 2021-06-21 DIAGNOSIS — E538 Deficiency of other specified B group vitamins: Secondary | ICD-10-CM | POA: Diagnosis not present

## 2021-06-21 LAB — CBC WITH DIFFERENTIAL/PLATELET
Basophils Absolute: 0 10*3/uL (ref 0.0–0.1)
Basophils Relative: 0.3 % (ref 0.0–3.0)
Eosinophils Absolute: 0.1 10*3/uL (ref 0.0–0.7)
Eosinophils Relative: 1.1 % (ref 0.0–5.0)
HCT: 44.9 % (ref 36.0–46.0)
Hemoglobin: 14.5 g/dL (ref 12.0–15.0)
Lymphocytes Relative: 30.7 % (ref 12.0–46.0)
Lymphs Abs: 3.3 10*3/uL (ref 0.7–4.0)
MCHC: 32.3 g/dL (ref 30.0–36.0)
MCV: 98.6 fl (ref 78.0–100.0)
Monocytes Absolute: 0.9 10*3/uL (ref 0.1–1.0)
Monocytes Relative: 8.4 % (ref 3.0–12.0)
Neutro Abs: 6.4 10*3/uL (ref 1.4–7.7)
Neutrophils Relative %: 59.5 % (ref 43.0–77.0)
Platelets: 278 10*3/uL (ref 150.0–400.0)
RBC: 4.56 Mil/uL (ref 3.87–5.11)
RDW: 15.2 % (ref 11.5–15.5)
WBC: 10.7 10*3/uL — ABNORMAL HIGH (ref 4.0–10.5)

## 2021-06-21 LAB — URINALYSIS, ROUTINE W REFLEX MICROSCOPIC
Bilirubin Urine: NEGATIVE
Hgb urine dipstick: NEGATIVE
Ketones, ur: NEGATIVE
Leukocytes,Ua: NEGATIVE
Nitrite: NEGATIVE
RBC / HPF: NONE SEEN (ref 0–?)
Specific Gravity, Urine: 1.015 (ref 1.000–1.030)
Total Protein, Urine: NEGATIVE
Urine Glucose: NEGATIVE
Urobilinogen, UA: 0.2 (ref 0.0–1.0)
pH: 6 (ref 5.0–8.0)

## 2021-06-21 LAB — LIPID PANEL
Cholesterol: 109 mg/dL (ref 0–200)
HDL: 48.7 mg/dL (ref >39.00)
LDL Cholesterol: 45 mg/dL (ref 0–99)
NonHDL: 60.77
Total CHOL/HDL Ratio: 2
Triglycerides: 77 mg/dL (ref 0.0–149.0)
VLDL: 15.4 mg/dL (ref 0.0–40.0)

## 2021-06-21 LAB — HEPATIC FUNCTION PANEL
ALT: 18 U/L (ref 0–35)
AST: 14 U/L (ref 0–37)
Albumin: 3.9 g/dL (ref 3.5–5.2)
Alkaline Phosphatase: 44 U/L (ref 39–117)
Bilirubin, Direct: 0.1 mg/dL (ref 0.0–0.3)
Total Bilirubin: 0.6 mg/dL (ref 0.2–1.2)
Total Protein: 6.8 g/dL (ref 6.0–8.3)

## 2021-06-21 LAB — BASIC METABOLIC PANEL
BUN: 9 mg/dL (ref 6–23)
CO2: 25 mEq/L (ref 19–32)
Calcium: 9.8 mg/dL (ref 8.4–10.5)
Chloride: 106 mEq/L (ref 96–112)
Creatinine, Ser: 0.97 mg/dL (ref 0.40–1.20)
GFR: 61.26 mL/min (ref >60.00)
Glucose, Bld: 85 mg/dL (ref 70–99)
Potassium: 4.1 mEq/L (ref 3.5–5.1)
Sodium: 143 mEq/L (ref 135–145)

## 2021-06-21 LAB — FOLATE: Folate: 11.4 ng/mL (ref >5.9)

## 2021-06-21 LAB — VITAMIN B12: Vitamin B-12: 764 pg/mL (ref 211–911)

## 2021-06-21 LAB — TSH: TSH: 1.7 u[IU]/mL (ref 0.35–5.50)

## 2021-06-21 MED ORDER — SHINGRIX 50 MCG/0.5ML IM SUSR
0.5000 mL | Freq: Once | INTRAMUSCULAR | 1 refills | Status: AC
Start: 2021-06-21 — End: 2021-06-21

## 2021-06-21 MED ORDER — BUPROPION HCL ER (XL) 150 MG PO TB24: 150.0000 mg | ORAL_TABLET | Freq: Every day | ORAL | 1 refills | Status: DC

## 2021-06-21 NOTE — Patient Instructions (Signed)

## 2021-06-21 NOTE — Progress Notes (Signed)
Subjective:  Patient ID: April May, female    DOB: 01-17-1956  Age: 66 y.o. MRN: [id number]  CC: Annual Exam, Hypertension, and Hyperlipidemia  This visit occurred during the SARS-CoV-2 public health emergency.  Safety protocols were in place, including screening questions prior to the visit, additional usage of staff PPE, and extensive cleaning of exam room while observing appropriate contact time as indicated for disinfecting solutions.    HPI [facility] presents for a CPX -  She would like to reduce the dose of Wellbutrin and change it to a once a day formulation.  Her mood is good.  She is active and denies chest pain, shortness of breath, diaphoresis, dizziness, lightheadedness, or edema.  She complains of chronic, unchanged, intermittent diarrhea with no abdominal pain, nausea, vomiting, melena, or bright red blood per rectum.  She has lost weight with lifestyle modifications.  Outpatient Medications Prior to Visit  Medication Sig Dispense Refill   amLODipine (NORVASC) 10 MG tablet Take 10 mg by mouth daily.     budesonide (ENTOCORT EC) 3 MG 24 hr capsule Take 3 capsules (9 mg total) by mouth daily. 270 capsule 3   clonazePAM (KLONOPIN) 0.5 MG tablet TAKE 1 TABLET (0.5 MG TOTAL) BY MOUTH 2 (TWO) TIMES DAILY AS NEEDED FOR ANXIETY. 60 tablet 1   [facility] PAIN RELIEF EXTRA STRENGTH 500 MG tablet TAKE 1 TABLET BY MOUTH EVERY 8 HOURS AS NEEDED FOR MODERATE PAIN 60 tablet 5   cyanocobalamin 2000 MCG tablet Take 1 tablet (2,000 mcg total) by mouth daily. 90 tablet 1   ezetimibe (ZETIA) 10 MG tablet Take 1 tablet (10 mg total) by mouth daily. 90 tablet 3   fluticasone (FLONASE) 50 MCG/ACT nasal spray Place 2 sprays into both nostrils daily. 48 g 3   nebivolol (BYSTOLIC) 10 MG tablet TAKE 1 TABLET BY MOUTH DAILY 90 tablet 0   Potassium Citrate 15 MEQ (1620 MG) TBCR TAKE 1 TABLET BY MOUTH IN   THE MORNING , AT NOON, AND  AT BEDTIME 200 tablet 1   PROAIR HFA 108 (90 Base)  MCG/ACT inhaler TAKE 2 PUFFS BY MOUTH EVERY 6 HOURS AS NEEDED FOR WHEEZE OR SHORTNESS OF BREATH 18 g 5   spironolactone (ALDACTONE) 100 MG tablet TAKE 1 TABLET BY MOUTH  DAILY 90 tablet 0   STIOLTO RESPIMAT 2.5-2.5 MCG/ACT AERS USE 2 INHALATIONS BY MOUTH  DAILY 12 g 1   buPROPion (WELLBUTRIN SR) 150 MG 12 hr tablet TAKE 1 TABLET BY MOUTH  TWICE DAILY 180 tablet 0   Cholecalciferol (VITAMIN D3) 1.25 MG (50000 UT) CAPS TAKE 1 CAPSULE BY MOUTH ONE TIME PER WEEK 12 capsule 0   lamoTRIgine (LAMICTAL) 200 MG tablet TAKE 1 TABLET BY MOUTH AT  BEDTIME 90 tablet 0   hydrALAZINE (APRESOLINE) 25 MG tablet TAKE 1 TABLET BY MOUTH THREE TIMES A DAY (Patient not taking: Reported on 06/21/2021) 270 tablet 0   No facility-administered medications prior to visit.    ROS Review of Systems  Constitutional:  Negative for chills, diaphoresis, fatigue and fever.  HENT: Negative.    Eyes: Negative.   Respiratory:  Negative for cough, shortness of breath and wheezing.   Cardiovascular:  Negative for chest pain, palpitations and leg swelling.  Gastrointestinal:  Positive for diarrhea. Negative for abdominal pain, blood in stool, nausea, rectal pain and vomiting.  Endocrine: Negative.   Genitourinary: Negative.  Negative for difficulty urinating and hematuria.  Musculoskeletal:  Negative for arthralgias and myalgias.  Skin:  Negative.  Negative for color change and pallor.  Neurological:  Negative for dizziness, weakness, light-headedness and headaches.  Hematological:  Negative for adenopathy. Does not bruise/bleed easily.  Psychiatric/Behavioral: Negative.     Objective:  BP 132/84 (BP Location: Left Arm, Patient Position: Sitting, Cuff Size: Large)    Pulse 65    Temp 98.7 F (37.1 C) (Oral)    Resp 16    Ht 5' (1.524 m)    Wt 226 lb (102.5 kg)    LMP 05/14/1988    SpO2 98%    BMI 44.14 kg/m   BP Readings from Last 3 Encounters:  06/21/21 132/84  12/08/20 (!) 144/82  10/31/20 122/86    Wt Readings from  Last 3 Encounters:  06/21/21 226 lb (102.5 kg)  12/08/20 232 lb (105.2 kg)  10/31/20 236 lb (107 kg)    Physical Exam Vitals reviewed.  Constitutional:      Appearance: She is obese. She is not ill-appearing.  HENT:     Nose: Nose normal.     Mouth/Throat:     Mouth: Mucous membranes are moist.  Eyes:     General: No scleral icterus.    Conjunctiva/sclera: Conjunctivae normal.  Cardiovascular:     Rate and Rhythm: Normal rate and regular rhythm.     Pulses:          Dorsalis pedis pulses are 1+ on the right side and 1+ on the left side.       Posterior tibial pulses are 1+ on the right side and 1+ on the left side.     Heart sounds: No murmur heard.   No gallop.  Pulmonary:     Effort: Pulmonary effort is normal.     Breath sounds: No stridor. No wheezing, rhonchi or rales.  Abdominal:     General: Abdomen is flat.     Palpations: There is no mass.     Tenderness: There is no abdominal tenderness. There is no guarding.     Hernia: No hernia is present.  Musculoskeletal:        General: Normal range of motion.     Cervical back: Neck supple.     Right lower leg: No edema.     Left lower leg: No edema.  Feet:     Right foot:     Skin integrity: Skin integrity normal.     Toenail Condition: Right toenails are normal.     Left foot:     Skin integrity: Skin integrity normal.     Toenail Condition: Left toenails are normal.  Lymphadenopathy:     Cervical: No cervical adenopathy.  Skin:    General: Skin is warm and dry.  Neurological:     General: No focal deficit present.     Mental Status: She is alert.  Psychiatric:        Mood and Affect: Mood normal.        Behavior: Behavior normal.    Lab Results  Component Value Date   WBC 10.7 (H) 06/21/2021   HGB 14.5 06/21/2021   HCT 44.9 06/21/2021   PLT 278.0 06/21/2021   GLUCOSE 85 06/21/2021   CHOL 109 06/21/2021   TRIG 77.0 06/21/2021   HDL 48.70 06/21/2021   LDLCALC 45 06/21/2021   ALT 18 06/21/2021   AST  14 06/21/2021   NA 143 06/21/2021   K 4.1 06/21/2021   CL 106 06/21/2021   CREATININE 0.97 06/21/2021   BUN 9 06/21/2021   CO2  25 06/21/2021   TSH 1.70 06/21/2021   HGBA1C 5.7 06/21/2020   MICROALBUR <0.7 06/10/2019    MM 3D SCREEN BREAST BILATERAL  Result Date: 12/21/2020 CLINICAL DATA:  Screening. EXAM: DIGITAL SCREENING BILATERAL MAMMOGRAM WITH TOMOSYNTHESIS AND CAD TECHNIQUE: Bilateral screening digital craniocaudal and mediolateral oblique mammograms were obtained. Bilateral screening digital breast tomosynthesis was performed. The images were evaluated with computer-aided detection. COMPARISON:  Previous exam(s). ACR Breast Density Category a: The breast tissue is almost entirely fatty. FINDINGS: There are no findings suspicious for malignancy. IMPRESSION: No mammographic evidence of malignancy. A result letter of this screening mammogram will be mailed directly to the patient. RECOMMENDATION: Screening mammogram in one year. (Code:SM-B-01Y) BI-RADS CATEGORY  1: Negative. Electronically Signed   By: Margarette Canada M.D.   On: 12/21/2020 13:11    Assessment & Plan:   Allessandra was seen today for annual exam, hypertension and hyperlipidemia.  Diagnoses and all orders for this visit:  Malignant hypertension- Her blood pressure is adequately well controlled.  Electrolytes and renal function are normal. -     Basic metabolic panel; Future -     TSH; Future -     Urinalysis, Routine w reflex microscopic; Future -     Hepatic function panel; Future -     CBC with Differential/Platelet; Future -     CBC with Differential/Platelet -     Hepatic function panel -     Urinalysis, Routine w reflex microscopic -     TSH -     Basic metabolic panel  Encounter for general adult medical examination with abnormal findings- Exam completed, labs reviewed, vaccines reviewed and updated, cancer screenings addressed, patient education was given.  Tobacco use disorder -     Ambulatory Referral for Lung  Cancer Scre  Recurrent major depressive disorder, in full remission ([place]) -     buPROPion (WELLBUTRIN XL) 150 MG 24 hr tablet; Take 1 tablet (150 mg total) by mouth daily. -     TSH; Future -     TSH  Vitamin B12 deficiency- Her H&H, B12, and folate are normal. -     Vitamin B12; Future -     CBC with Differential/Platelet; Future -     Folate; Future -     Folate -     CBC with Differential/Platelet -     Vitamin B12  Need for shingles vaccine -     Zoster Vaccine Adjuvanted [facility]) injection; Inject 0.5 mLs into the muscle once for 1 dose.  Hyperlipidemia LDL goal <130- Statin tx is not indicated. -     Lipid panel; Future -     TSH; Future -     Hepatic function panel; Future -     Hepatic function panel -     TSH -     Lipid panel  Vitamin D deficiency -     Cholecalciferol (VITAMIN D3) 1.25 MG (50000 UT) CAPS; TAKE 1 CAPSULE BY MOUTH ONE TIME PER WEEK  Current moderate episode of major depressive disorder, unspecified whether recurrent (HCC) -     lamoTRIgine (LAMICTAL) 200 MG tablet; Take 1 tablet (200 mg total) by mouth at bedtime.   I have discontinued Kryslyn W. Kopinski's hydrALAZINE and buPROPion. I have also changed her lamoTRIgine. Additionally, I am having her start on buPROPion and Shingrix. Lastly, I am having her maintain her CVS Pain Relief Extra Strength, ProAir HFA, fluticasone, ezetimibe, Stiolto Respimat, budesonide, clonazePAM, cyanocobalamin, Potassium Citrate, nebivolol, spironolactone, amLODipine, and Vitamin D3.  Meds ordered this encounter  Medications   buPROPion (WELLBUTRIN XL) 150 MG 24 hr tablet    Sig: Take 1 tablet (150 mg total) by mouth daily.    Dispense:  90 tablet    Refill:  1   Zoster Vaccine Adjuvanted [facility]) injection    Sig: Inject 0.5 mLs into the muscle once for 1 dose.    Dispense:  0.5 mL    Refill:  1   Cholecalciferol (VITAMIN D3) 1.25 MG (50000 UT) CAPS    Sig: TAKE 1 CAPSULE BY MOUTH ONE TIME PER WEEK     Dispense:  12 capsule    Refill:  0   lamoTRIgine (LAMICTAL) 200 MG tablet    Sig: Take 1 tablet (200 mg total) by mouth at bedtime.    Dispense:  90 tablet    Refill:  0     Follow-up: Return in about 6 months (around 12/19/2021).  Scarlette Calico, MD

## 2021-06-23 ENCOUNTER — Other Ambulatory Visit: Payer: Self-pay | Admitting: Internal Medicine

## 2021-06-23 DIAGNOSIS — F321 Major depressive disorder, single episode, moderate: Secondary | ICD-10-CM

## 2021-06-23 DIAGNOSIS — E559 Vitamin D deficiency, unspecified: Secondary | ICD-10-CM

## 2021-06-24 DIAGNOSIS — F321 Major depressive disorder, single episode, moderate: Secondary | ICD-10-CM | POA: Insufficient documentation

## 2021-06-24 DIAGNOSIS — Z23 Encounter for immunization: Secondary | ICD-10-CM | POA: Insufficient documentation

## 2021-06-24 DIAGNOSIS — E559 Vitamin D deficiency, unspecified: Secondary | ICD-10-CM | POA: Insufficient documentation

## 2021-06-24 DIAGNOSIS — E785 Hyperlipidemia, unspecified: Secondary | ICD-10-CM | POA: Insufficient documentation

## 2021-06-24 MED ORDER — VITAMIN D3 1.25 MG (50000 UT) PO CAPS: ORAL_CAPSULE | ORAL | 0 refills | Status: DC

## 2021-06-24 MED ORDER — LAMOTRIGINE 200 MG PO TABS: 200.0000 mg | ORAL_TABLET | Freq: Every day | ORAL | 0 refills | Status: DC

## 2021-06-26 ENCOUNTER — Encounter: Payer: Self-pay | Admitting: Internal Medicine

## 2021-06-26 ENCOUNTER — Telehealth: Payer: Self-pay | Admitting: Internal Medicine

## 2021-06-26 NOTE — Telephone Encounter (Signed)
LVM informing pt that Rx refill was sent on 2/11

## 2021-06-26 NOTE — Telephone Encounter (Signed)
I have iconfirmed with the pharmacist that pt is to take the bupropion 152m once a day per Epic.

## 2021-06-26 NOTE — Telephone Encounter (Signed)
Connected to [employer] 2.11.2023.   Caller states she has a medication and she thought she had more. The prescription needs to be refilled. They are saying her local pharmacy cannot give her an emergency supply until the script is received. Her local pharmacy does not show the script on file. Need a temporary prescription sent in to have on file. Gets scripts from [facility]. takes lamotrigine 200mg  once daily at night. states she is concerned that she will miss 3 doses over the weekend if not refill. no new or worsening symptoms.

## 2021-06-26 NOTE — Telephone Encounter (Signed)
Lucy from [employer] calls today in regards to PT's buPROPion 150 mg. There seems to be a change in the twice a day and the 150 mg once a day tablets PT is currently taking. (Looks like PT is already prescribed   CB: [phone]

## 2021-06-27 ENCOUNTER — Telehealth: Payer: Medicare Other | Admitting: Internal Medicine

## 2021-06-28 DIAGNOSIS — G4733 Obstructive sleep apnea (adult) (pediatric): Secondary | ICD-10-CM | POA: Diagnosis not present

## 2021-07-09 ENCOUNTER — Other Ambulatory Visit: Payer: Self-pay | Admitting: Internal Medicine

## 2021-07-10 ENCOUNTER — Encounter: Payer: Self-pay | Admitting: Internal Medicine

## 2021-07-10 ENCOUNTER — Telehealth (INDEPENDENT_AMBULATORY_CARE_PROVIDER_SITE_OTHER): Payer: Medicare Other | Admitting: Internal Medicine

## 2021-07-10 DIAGNOSIS — J440 Chronic obstructive pulmonary disease with acute lower respiratory infection: Secondary | ICD-10-CM | POA: Diagnosis not present

## 2021-07-10 DIAGNOSIS — J209 Acute bronchitis, unspecified: Secondary | ICD-10-CM | POA: Diagnosis not present

## 2021-07-10 MED ORDER — AZITHROMYCIN 250 MG PO TABS: ORAL_TABLET | ORAL | 0 refills | Status: AC

## 2021-07-10 NOTE — Progress Notes (Signed)
Virtual Visit via Video Note  I connected with April May on 07/10/21 at  4:00 PM EST by a video enabled telemedicine application and verified that I am speaking with the correct person using two identifiers.  Location patient: home Location provider: work office Persons participating in the virtual visit: patient, provider  I discussed the limitations of evaluation and management by telemedicine and the availability of in person appointments. The patient expressed understanding and agreed to proceed.   HPI: For the past week she has been having sneezing, cough and copious amounts of yellow phlegm coming from both her nose and her lungs.  She has a history of COPD.  She has taken 3 COVID tests that have resulted negative.  She declines fever or shortness of breath.   ROS: Constitutional: Denies fever, chills, diaphoresis, appetite change and fatigue.  HEENT: Denies photophobia, eye pain, redness, trouble swallowing, neck pain, neck stiffness and tinnitus.   Respiratory: Denies SOB, DOE,  chest tightness,  and wheezing.   Cardiovascular: Denies chest pain, palpitations and leg swelling.  Gastrointestinal: Denies nausea, vomiting, abdominal pain, diarrhea, constipation, blood in stool and abdominal distention.  Genitourinary: Denies dysuria, urgency, frequency, hematuria, flank pain and difficulty urinating.  Endocrine: Denies: hot or cold intolerance, sweats, changes in hair or nails, polyuria, polydipsia. Musculoskeletal: Denies myalgias, back pain, joint swelling, arthralgias and gait problem.  Skin: Denies pallor, rash and wound.  Neurological: Denies dizziness, seizures, syncope, weakness, light-headedness, numbness and headaches.  Hematological: Denies adenopathy. Easy bruising, personal or family bleeding history  Psychiatric/Behavioral: Denies suicidal ideation, mood changes, confusion, nervousness, sleep disturbance and agitation   Past Medical History:   Diagnosis Date   Allergy    Anxiety    Benign breast lumps    COPD (chronic obstructive pulmonary disease) ([place])    Crohn disease ([place])    Depression    Diabetes mellitus type II    borderline, md checks hemaglobin A 1 c at md office    Hypertension    Sleep apnea    does not use cpap since weight loss 1 and 1/2 years ago   Small bowel obstruction ([place]) 2016   Squamous cell carcinoma of arm, 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   Vitamin D deficiency     Past Surgical History:  Procedure Laterality Date   ABDOMINAL HYSTERECTOMY     1 ovary removed   APPENDECTOMY     APPLICATION OF WOUND VAC  11/24/2014   Procedure: APPLICATION OF WOUND VAC;  Surgeon: Donnie Mesa, MD;  Location: [facility];  Service: General;;   BOWEL RESECTION N/A 11/24/2014   Procedure: SMALL BOWEL RESECTION;  Surgeon: Donnie Mesa, MD;  Location: [facility];  Service: General;  Laterality: N/A;   BREAST EXCISIONAL BIOPSY Right 1972   BREAST SURGERY Right 40 yrs ago   benign tumor removed   LAPAROTOMY N/A 11/24/2014   Procedure: EXPLORATORY LAPAROTOMY EXTENSIVE LYSIS OF ADHESIONS SAMLL BOWEL RESECTION RIGHT HEMI COLECTOMY WOUND VAC APPLICATION. ;  Surgeon: Donnie Mesa, MD;  Location: [facility];  Service: General;  Laterality: N/A;   LYSIS OF ADHESION  11/24/2014   Procedure: LYSIS OF ADHESION (3 HRS);  Surgeon: Donnie Mesa, MD;  Location: [facility];  Service: General;;   PARTIAL COLECTOMY  11/24/2014   Procedure: RIGHT HEMI COLECTOMY;  Surgeon: Donnie Mesa, MD;  Location: [facility];  Service: General;;   SMALL INTESTINE SURGERY  age 74    Family History  Problem Relation Age of Onset   Hypertension Mother  Ulcerative colitis Mother    Crohn's disease Mother    Heart disease Mother    Kidney disease Mother    Arthritis Father    Brain cancer Father    Diabetes Father    Alcohol abuse Paternal Uncle    Diabetes Paternal Grandmother    Stroke Paternal Grandmother    Heart failure Sister    Kidney disease Sister    Heart failure  Brother    Other Sister        prediabetes   Kidney cancer Brother     SOCIAL HX:   reports that she has been smoking cigarettes. She has a 17.50 pack-year smoking history. She has never used smokeless tobacco. She reports that she does not drink alcohol and does not use drugs.   Current Outpatient Medications:    azithromycin (ZITHROMAX) 250 MG tablet, Take 2 tablets on day 1, then 1 tablet daily on days 2 through 5, Disp: 6 tablet, Rfl: 0   amLODipine (NORVASC) 10 MG tablet, TAKE 1 TABLET BY MOUTH  DAILY, Disp: 90 tablet, Rfl: 1   budesonide (ENTOCORT EC) 3 MG 24 hr capsule, Take 3 capsules (9 mg total) by mouth daily., Disp: 270 capsule, Rfl: 3   buPROPion (WELLBUTRIN XL) 150 MG 24 hr tablet, Take 1 tablet (150 mg total) by mouth daily., Disp: 90 tablet, Rfl: 1   Cholecalciferol (VITAMIN D3) 1.25 MG (50000 UT) CAPS, TAKE 1 CAPSULE BY MOUTH ONE TIME PER WEEK, Disp: 12 capsule, Rfl: 0   clonazePAM (KLONOPIN) 0.5 MG tablet, TAKE 1 TABLET (0.5 MG TOTAL) BY MOUTH 2 (TWO) TIMES DAILY AS NEEDED FOR ANXIETY., Disp: 60 tablet, Rfl: 1   [employer] PAIN RELIEF EXTRA STRENGTH 500 MG tablet, TAKE 1 TABLET BY MOUTH EVERY 8 HOURS AS NEEDED FOR MODERATE PAIN, Disp: 60 tablet, Rfl: 5   cyanocobalamin 2000 MCG tablet, Take 1 tablet (2,000 mcg total) by mouth daily., Disp: 90 tablet, Rfl: 1   ezetimibe (ZETIA) 10 MG tablet, Take 1 tablet (10 mg total) by mouth daily., Disp: 90 tablet, Rfl: 3   fluticasone (FLONASE) 50 MCG/ACT nasal spray, Place 2 sprays into both nostrils daily., Disp: 48 g, Rfl: 3   lamoTRIgine (LAMICTAL) 200 MG tablet, Take 1 tablet (200 mg total) by mouth at bedtime., Disp: 90 tablet, Rfl: 0   nebivolol (BYSTOLIC) 10 MG tablet, TAKE 1 TABLET BY MOUTH DAILY, Disp: 90 tablet, Rfl: 0   Potassium Citrate 15 MEQ (1620 MG) TBCR, TAKE 1 TABLET BY MOUTH IN   THE MORNING , AT NOON, AND  AT BEDTIME, Disp: 200 tablet, Rfl: 1   PROAIR HFA 108 (90 Base) MCG/ACT inhaler, TAKE 2 PUFFS BY MOUTH EVERY 6 HOURS AS  NEEDED FOR WHEEZE OR SHORTNESS OF BREATH, Disp: 18 g, Rfl: 5   spironolactone (ALDACTONE) 100 MG tablet, TAKE 1 TABLET BY MOUTH  DAILY, Disp: 90 tablet, Rfl: 0   STIOLTO RESPIMAT 2.5-2.5 MCG/ACT AERS, USE 2 INHALATIONS BY MOUTH  DAILY, Disp: 12 g, Rfl: 1  EXAM:   VITALS per patient if applicable: None reported  GENERAL: alert, oriented, appears well and in no acute distress  HEENT: atraumatic, conjunttiva clear, no obvious abnormalities on inspection of external nose and ears  NECK: normal movements of the head and neck  LUNGS: on inspection no signs of respiratory distress, breathing rate appears normal, no obvious gross increased work of breathing, gasping or wheezing  CV: no obvious cyanosis  MS: moves all visible extremities without noticeable abnormality  PSYCH/NEURO:  pleasant and cooperative, no obvious depression or anxiety, speech and thought processing grossly intact  ASSESSMENT AND PLAN:   Acute bronchitis with COPD ([place])  - Plan: azithromycin (ZITHROMAX) 250 MG tablet -Given her history of COPD, duration of symptoms and what she describes as copious amount of yellow sputum production, I believe it is reasonable to treat her with a Z-Pak, have also advised OTC pain meds, decongestants and Mucinex as needed. -She knows to follow-up with [place] if symptoms fail to resolve.     I discussed the assessment and treatment plan with the patient. The patient was provided an opportunity to ask questions and all were answered. The patient agreed with the plan and demonstrated an understanding of the instructions.   The patient was advised to call back or seek an in-person evaluation if the symptoms worsen or if the condition fails to improve as anticipated.    Lelon Frohlich, MD  [facility]

## 2021-07-14 ENCOUNTER — Telehealth: Payer: Self-pay | Admitting: Neurology

## 2021-07-14 NOTE — Telephone Encounter (Signed)
Pt called to inform the provider and to have it documented that she was not able to use her cpap machine for 2-3 days at the end of Feb due to having a cold. Pt is feeling much better and is able to use the machine again.  ?

## 2021-07-17 NOTE — Telephone Encounter (Signed)
Noted, thank you

## 2021-07-27 ENCOUNTER — Ambulatory Visit: Payer: Medicare Other | Admitting: Nurse Practitioner

## 2021-07-31 NOTE — Progress Notes (Signed)
? ? ?Subjective:  ? ? Patient ID: April May, female    DOB: 08-05-1955, 66 y.o.   MRN: [id number] ? ?This visit occurred during the SARS-CoV-2 public health emergency.  Safety protocols were in place, including screening questions prior to the visit, additional usage of staff PPE, and extensive cleaning of exam room while observing appropriate contact time as indicated for disinfecting solutions. ? ? ? ?HPI ?[name] is here for  ?Chief Complaint  ?Patient presents with  ? Cough  ?  Cough with mucus (sometimes hard to get up) x 3 weeks. Patient request B12 shot; also wants to be checked for possible bedbug bites (?) On her left side of chest and top of left shoulder  ? ? ? ?Her symptoms started almost 3 weeks ago ? ?She is experiencing runny nose and allergy like symptoms.  She has a virtual visit and was prescribed zpak.  It did help her runny nose, but not her cough - mucus in the chest. ? ?She still has an intermittent cough and she can hear the mucus in her chest but it is not coming up.  It is not getting worse or better.  ? ?She has taken the zpak.  She took mucinex but it did not help.   ? ?She has been taking Stiolto intermittently.  She needs a new albuterol inhaler-she has not tried that during this illness.   ? ?Her covid test here today is negative. ? ? ? ?Medications and allergies reviewed with patient and updated if appropriate. ? ?Current Outpatient Medications on File Prior to Visit  ?Medication Sig Dispense Refill  ? amLODipine (NORVASC) 10 MG tablet TAKE 1 TABLET BY MOUTH  DAILY 90 tablet 1  ? budesonide (ENTOCORT EC) 3 MG 24 hr capsule Take 3 capsules (9 mg total) by mouth daily. 270 capsule 3  ? buPROPion (WELLBUTRIN XL) 150 MG 24 hr tablet Take 1 tablet (150 mg total) by mouth daily. 90 tablet 1  ? Cholecalciferol (VITAMIN D3) 1.25 MG (50000 UT) CAPS TAKE 1 CAPSULE BY MOUTH ONE TIME PER WEEK 12 capsule 0  ? clonazePAM (KLONOPIN) 0.5 MG tablet TAKE 1 TABLET (0.5 MG TOTAL) BY MOUTH 2  (TWO) TIMES DAILY AS NEEDED FOR ANXIETY. 60 tablet 1  ? [facility] PAIN RELIEF EXTRA STRENGTH 500 MG tablet TAKE 1 TABLET BY MOUTH EVERY 8 HOURS AS NEEDED FOR MODERATE PAIN 60 tablet 5  ? cyanocobalamin 2000 MCG tablet Take 1 tablet (2,000 mcg total) by mouth daily. 90 tablet 1  ? ezetimibe (ZETIA) 10 MG tablet Take 1 tablet (10 mg total) by mouth daily. 90 tablet 3  ? fluticasone (FLONASE) 50 MCG/ACT nasal spray Place 2 sprays into both nostrils daily. 48 g 3  ? nebivolol (BYSTOLIC) 10 MG tablet TAKE 1 TABLET BY MOUTH DAILY 90 tablet 0  ? Potassium Citrate 15 MEQ (1620 MG) TBCR TAKE 1 TABLET BY MOUTH IN   THE MORNING , AT NOON, AND  AT BEDTIME 200 tablet 1  ? PROAIR HFA 108 (90 Base) MCG/ACT inhaler TAKE 2 PUFFS BY MOUTH EVERY 6 HOURS AS NEEDED FOR WHEEZE OR SHORTNESS OF BREATH 18 g 5  ? spironolactone (ALDACTONE) 100 MG tablet TAKE 1 TABLET BY MOUTH  DAILY 90 tablet 0  ? STIOLTO RESPIMAT 2.5-2.5 MCG/ACT AERS USE 2 INHALATIONS BY MOUTH  DAILY 12 g 1  ? ?No current facility-administered medications on file prior to visit.  ? ? ?Review of Systems  ?Constitutional:  Negative for chills and fever.  ?  HENT:  Negative for congestion, ear pain, sinus pain and sore throat.   ?Respiratory:  Positive for cough, shortness of breath (mild) and wheezing (when she lays down). Negative for chest tightness.   ?Cardiovascular:  Negative for chest pain.  ?Neurological:  Positive for light-headedness (occ). Negative for headaches.  ? ?   ?Objective:  ? ?Vitals:  ? 08/01/21 0907  ?BP: 140/80  ?Pulse: (!) 53  ?Temp: 98.9 ?F (37.2 ?C)  ?SpO2: 99%  ? ?BP Readings from Last 3 Encounters:  ?08/01/21 140/80  ?06/21/21 132/84  ?12/08/20 (!) 144/82  ? ?Wt Readings from Last 3 Encounters:  ?08/01/21 230 lb (104.3 kg)  ?06/21/21 226 lb (102.5 kg)  ?12/08/20 232 lb (105.2 kg)  ? ?Body mass index is 44.92 kg/m?. ? ?  ?Physical Exam ?Constitutional:   ?   General: She is not in acute distress. ?   Appearance: Normal appearance. She is not ill-appearing.   ?HENT:  ?   Head: Normocephalic and atraumatic.  ?   Right Ear: Tympanic membrane, ear canal and external ear normal.  ?   Left Ear: Tympanic membrane, ear canal and external ear normal.  ?   Mouth/Throat:  ?   Mouth: Mucous membranes are moist.  ?   Pharynx: No oropharyngeal exudate or posterior oropharyngeal erythema.  ?Eyes:  ?   Conjunctiva/sclera: Conjunctivae normal.  ?Cardiovascular:  ?   Rate and Rhythm: Normal rate and regular rhythm.  ?Pulmonary:  ?   Effort: Pulmonary effort is normal. No respiratory distress.  ?   Breath sounds: Normal breath sounds. No wheezing or rales.  ?Musculoskeletal:  ?   Cervical back: Neck supple. No tenderness.  ?Lymphadenopathy:  ?   Cervical: No cervical adenopathy.  ?Skin: ?   General: Skin is warm and dry.  ?Neurological:  ?   Mental Status: She is alert.  ? ?   ? ? ? ? ? ?Assessment & Plan:  ? ? ?URI, COPD exacerbation: ?COVID test negative here ?She did take a Z-Pak most likely her symptoms are viral in nature ?Lungs clear on exam ?Depo-Medrol 40 mg IM x1 ?Advised to start Stiolto inhaler daily-stressed the importance of taking this on a daily basis, especially when she is sick or has a flare of her COPD ?Use albuterol inhaler twice daily for 1 week and use as needed-refill sent to pharmacy ?Over-the-counter cold medications ?No additional antibiotics needed ?She will call if her symptoms or not improving ? ? ?B12: ?Chronic ?B12 injection today -- continue monthly injections ? ? ?

## 2021-08-01 ENCOUNTER — Other Ambulatory Visit: Payer: Self-pay

## 2021-08-01 ENCOUNTER — Other Ambulatory Visit: Payer: Self-pay | Admitting: Internal Medicine

## 2021-08-01 ENCOUNTER — Encounter: Payer: Self-pay | Admitting: Internal Medicine

## 2021-08-01 ENCOUNTER — Ambulatory Visit (INDEPENDENT_AMBULATORY_CARE_PROVIDER_SITE_OTHER): Payer: Medicare Other | Admitting: Internal Medicine

## 2021-08-01 VITALS — BP 140/80 | HR 53 | Temp 98.9°F | Ht 60.0 in | Wt 230.0 lb

## 2021-08-01 DIAGNOSIS — E538 Deficiency of other specified B group vitamins: Secondary | ICD-10-CM | POA: Diagnosis not present

## 2021-08-01 DIAGNOSIS — J449 Chronic obstructive pulmonary disease, unspecified: Secondary | ICD-10-CM

## 2021-08-01 DIAGNOSIS — J441 Chronic obstructive pulmonary disease with (acute) exacerbation: Secondary | ICD-10-CM | POA: Diagnosis not present

## 2021-08-01 DIAGNOSIS — J069 Acute upper respiratory infection, unspecified: Secondary | ICD-10-CM

## 2021-08-01 DIAGNOSIS — F321 Major depressive disorder, single episode, moderate: Secondary | ICD-10-CM

## 2021-08-01 MED ORDER — CYANOCOBALAMIN 1000 MCG/ML IJ SOLN
1000.0000 ug | Freq: Once | INTRAMUSCULAR | Status: AC
  Administered 2021-08-01: 1000 ug via INTRAMUSCULAR

## 2021-08-01 MED ORDER — ALBUTEROL SULFATE HFA 108 (90 BASE) MCG/ACT IN AERS: INHALATION_SPRAY | RESPIRATORY_TRACT | 5 refills | Status: AC

## 2021-08-01 MED ORDER — METHYLPREDNISOLONE ACETATE 40 MG/ML IJ SUSP
40.0000 mg | Freq: Once | INTRAMUSCULAR | Status: AC
  Administered 2021-08-01: 40 mg via INTRAMUSCULAR

## 2021-08-01 NOTE — Patient Instructions (Addendum)
? ? ? ?  B12 injection given.  A steroid injection was given today.  ? ? ?Medications changes include :   use otc cortisone for your bug bites. Use the stiolto inhaler every day.   Use the proair albuterol inhaler twice a day for one week and then just as needed.   ? ? ? ?Your prescription(s) have been sent to your pharmacy.  ? ? ? ?Return if symptoms worsen or fail to improve.  If your symptoms do not improve by Friday then call us and let us know.   ? ?

## 2021-08-02 LAB — POC COVID19 BINAXNOW: SARS Coronavirus 2 Ag: NEGATIVE

## 2021-08-03 ENCOUNTER — Other Ambulatory Visit: Payer: Self-pay | Admitting: Internal Medicine

## 2021-08-03 ENCOUNTER — Telehealth: Payer: Self-pay | Admitting: Internal Medicine

## 2021-08-03 DIAGNOSIS — D225 Melanocytic nevi of trunk: Secondary | ICD-10-CM | POA: Diagnosis not present

## 2021-08-03 DIAGNOSIS — Z1283 Encounter for screening for malignant neoplasm of skin: Secondary | ICD-10-CM | POA: Diagnosis not present

## 2021-08-03 NOTE — Telephone Encounter (Signed)
1.Medication Requested: Cholecalciferol (VITAMIN D3) 1.25 MG (50000 UT) CAPS ? ?2. Pharmacy (Name, Street, [place]):  ?[id number]- [place], [place] - [address] ?Phone:  [phone]?Fax:  [phone]? ? ?3. On Med List: yes ? ?4. Last Visit with PCP: 02.08.23 ? ?5. Next visit date with PCP: n/a ? ? ?Agent: Please be advised that RX refills may take up to 3 business days. We ask that you follow-up with your pharmacy.  ?

## 2021-08-04 ENCOUNTER — Other Ambulatory Visit: Payer: Self-pay | Admitting: Internal Medicine

## 2021-08-04 DIAGNOSIS — E559 Vitamin D deficiency, unspecified: Secondary | ICD-10-CM

## 2021-08-07 NOTE — Telephone Encounter (Signed)
Pt has been informed but would like to know if she can get vit d lab drawn and be advised by PCP in regard to vit D refill. She stated that she can not come in right now for an appointment. Please advise.  ?

## 2021-08-08 ENCOUNTER — Other Ambulatory Visit: Payer: Self-pay | Admitting: Internal Medicine

## 2021-08-08 ENCOUNTER — Other Ambulatory Visit (INDEPENDENT_AMBULATORY_CARE_PROVIDER_SITE_OTHER): Payer: Medicare Other

## 2021-08-08 DIAGNOSIS — E559 Vitamin D deficiency, unspecified: Secondary | ICD-10-CM

## 2021-08-08 LAB — VITAMIN D 25 HYDROXY (VIT D DEFICIENCY, FRACTURES): VITD: 46.38 ng/mL (ref 30.00–100.00)

## 2021-08-08 NOTE — Telephone Encounter (Signed)
Pt has been informed that lab was ordered.  ?

## 2021-08-11 ENCOUNTER — Encounter: Payer: Self-pay | Admitting: Internal Medicine

## 2021-08-15 ENCOUNTER — Other Ambulatory Visit: Payer: Self-pay | Admitting: Internal Medicine

## 2021-08-15 DIAGNOSIS — J22 Unspecified acute lower respiratory infection: Secondary | ICD-10-CM | POA: Insufficient documentation

## 2021-08-15 MED ORDER — AMOXICILLIN-POT CLAVULANATE 875-125 MG PO TABS: 1.0000 | ORAL_TABLET | Freq: Two times a day (BID) | ORAL | 0 refills | Status: AC

## 2021-08-17 ENCOUNTER — Other Ambulatory Visit: Payer: Self-pay | Admitting: Internal Medicine

## 2021-08-17 ENCOUNTER — Other Ambulatory Visit: Payer: Self-pay | Admitting: Cardiology

## 2021-08-17 DIAGNOSIS — I10 Essential (primary) hypertension: Secondary | ICD-10-CM

## 2021-08-22 ENCOUNTER — Ambulatory Visit (INDEPENDENT_AMBULATORY_CARE_PROVIDER_SITE_OTHER)
Admission: RE | Admit: 2021-08-22 | Discharge: 2021-08-22 | Disposition: A | Discharge status: Home / Self Care | Payer: Medicare Other | Source: Ambulatory Visit | Attending: Cardiology | Admitting: Cardiology

## 2021-08-22 ENCOUNTER — Encounter: Payer: Self-pay | Admitting: Internal Medicine

## 2021-08-22 DIAGNOSIS — F17210 Nicotine dependence, cigarettes, uncomplicated: Secondary | ICD-10-CM | POA: Diagnosis not present

## 2021-08-22 DIAGNOSIS — Z87891 Personal history of nicotine dependence: Secondary | ICD-10-CM | POA: Diagnosis not present

## 2021-08-23 ENCOUNTER — Other Ambulatory Visit: Payer: Self-pay | Admitting: Internal Medicine

## 2021-08-23 MED ORDER — FLUCONAZOLE 150 MG PO TABS: 150.0000 mg | ORAL_TABLET | Freq: Once | ORAL | 3 refills | Status: AC

## 2021-08-24 ENCOUNTER — Other Ambulatory Visit: Payer: Self-pay | Admitting: Internal Medicine

## 2021-08-24 DIAGNOSIS — I10 Essential (primary) hypertension: Secondary | ICD-10-CM

## 2021-08-24 DIAGNOSIS — E876 Hypokalemia: Secondary | ICD-10-CM

## 2021-08-26 ENCOUNTER — Other Ambulatory Visit: Payer: Self-pay

## 2021-08-26 ENCOUNTER — Emergency Department (HOSPITAL_COMMUNITY)
Admission: EM | Admit: 2021-08-26 | Discharge: 2021-08-26 | Disposition: A | Discharge status: Home / Self Care | Payer: Medicare Other | Attending: Emergency Medicine | Admitting: Emergency Medicine

## 2021-08-26 ENCOUNTER — Encounter (HOSPITAL_COMMUNITY): Payer: Self-pay | Admitting: Emergency Medicine

## 2021-08-26 DIAGNOSIS — M542 Cervicalgia: Secondary | ICD-10-CM

## 2021-08-26 DIAGNOSIS — E876 Hypokalemia: Secondary | ICD-10-CM

## 2021-08-26 DIAGNOSIS — Z85828 Personal history of other malignant neoplasm of skin: Secondary | ICD-10-CM | POA: Insufficient documentation

## 2021-08-26 DIAGNOSIS — J449 Chronic obstructive pulmonary disease, unspecified: Secondary | ICD-10-CM | POA: Insufficient documentation

## 2021-08-26 DIAGNOSIS — E119 Type 2 diabetes mellitus without complications: Secondary | ICD-10-CM | POA: Insufficient documentation

## 2021-08-26 DIAGNOSIS — Z7951 Long term (current) use of inhaled steroids: Secondary | ICD-10-CM | POA: Diagnosis not present

## 2021-08-26 DIAGNOSIS — R000 Tachycardia, unspecified: Secondary | ICD-10-CM | POA: Diagnosis not present

## 2021-08-26 DIAGNOSIS — Z79899 Other long term (current) drug therapy: Secondary | ICD-10-CM | POA: Insufficient documentation

## 2021-08-26 DIAGNOSIS — I10 Essential (primary) hypertension: Secondary | ICD-10-CM | POA: Insufficient documentation

## 2021-08-26 LAB — CBC WITH DIFFERENTIAL/PLATELET
Abs Immature Granulocytes: 0.06 10*3/uL (ref 0.00–0.07)
Basophils Absolute: 0.1 10*3/uL (ref 0.0–0.1)
Basophils Relative: 1 %
Eosinophils Absolute: 0.5 10*3/uL (ref 0.0–0.5)
Eosinophils Relative: 4 %
HCT: 46.8 % — ABNORMAL HIGH (ref 36.0–46.0)
Hemoglobin: 15.1 g/dL — ABNORMAL HIGH (ref 12.0–15.0)
Immature Granulocytes: 0 %
Lymphocytes Relative: 26 %
Lymphs Abs: 3.6 10*3/uL (ref 0.7–4.0)
MCH: 31.7 pg (ref 26.0–34.0)
MCHC: 32.3 g/dL (ref 30.0–36.0)
MCV: 98.3 fL (ref 80.0–100.0)
Monocytes Absolute: 1.2 10*3/uL — ABNORMAL HIGH (ref 0.1–1.0)
Monocytes Relative: 8 %
Neutro Abs: 8.5 10*3/uL — ABNORMAL HIGH (ref 1.7–7.7)
Neutrophils Relative %: 61 %
Platelets: 304 10*3/uL (ref 150–400)
RBC: 4.76 MIL/uL (ref 3.87–5.11)
RDW: 14 % (ref 11.5–15.5)
WBC: 13.9 10*3/uL — ABNORMAL HIGH (ref 4.0–10.5)
nRBC: 0 % (ref 0.0–0.2)

## 2021-08-26 LAB — BASIC METABOLIC PANEL
Anion gap: 7 (ref 5–15)
BUN: 6 mg/dL — ABNORMAL LOW (ref 8–23)
CO2: 26 mmol/L (ref 22–32)
Calcium: 9.5 mg/dL (ref 8.9–10.3)
Chloride: 108 mmol/L (ref 98–111)
Creatinine, Ser: 0.86 mg/dL (ref 0.44–1.00)
GFR, Estimated: >60 mL/min (ref >60)
Glucose, Bld: 120 mg/dL — ABNORMAL HIGH (ref 70–99)
Potassium: 3.2 mmol/L — ABNORMAL LOW (ref 3.5–5.1)
Sodium: 141 mmol/L (ref 135–145)

## 2021-08-26 MED ORDER — POTASSIUM CHLORIDE CRYS ER 20 MEQ PO TBCR
40.0000 meq | EXTENDED_RELEASE_TABLET | Freq: Once | ORAL | Status: AC
  Administered 2021-08-26: 40 meq via ORAL
  Filled 2021-08-26: qty 2

## 2021-08-26 NOTE — Discharge Instructions (Addendum)
Your exam today was reassuring and did not show any concerning cause of your pain.  Your potassium was also slightly low at 3.2.  You received potassium supplement in the emergency room.  Continue your normal potassium supplements starting tomorrow.  If you have any worsening in pain, difficulty swallowing, fever please return for evaluation.  We did discuss doing a CT scan however you state you would rather follow-up with your primary care provider and will return to the emergency room if you have any worsening symptoms.  ?

## 2021-08-26 NOTE — ED Triage Notes (Signed)
C/o 2 episodes of L sided neck pain only lasting a few seconds.  1st episode 1 hr ago and again 10 min ago.  Pain relieved when she her hand on neck.  Denies chest pain, sob, dizziness, or any other associated symptoms. ?

## 2021-08-26 NOTE — ED Provider Notes (Signed)
?[facility] ?Provider Note ? ? ?CSN: [id number] ?Arrival date & time: 08/26/21  1505 ? ?  ? ?History ? ?Chief Complaint  ?Patient presents with  ? Neck Pain  ? ? ?April May is a 66 y.o. female. ? ?66 year old female presents with her daughter for evaluation of left-sided neck pain.  Onset of the neck pain was today and she has had multiple episodes since then.  She states the pain only last a few seconds.  She denies radiation of the pain down to either of the upper extremities.  Denies associated fever, chills, difficulty swallowing, neck swelling, change in her voice.  She states she has had an upper respiratory infection now for couple weeks and has taken antibiotics for this as well.  She states improvement in most of the symptoms with the exception of cough. ? ?The history is provided by the patient. No language interpreter was used.  ? ?  ? ?Home Medications ?Prior to Admission medications   ?Medication Sig Start Date End Date Taking? Authorizing Provider  ?albuterol (PROAIR HFA) 108 (90 Base) MCG/ACT inhaler TAKE 2 PUFFS BY MOUTH EVERY 6 HOURS AS NEEDED FOR WHEEZE OR SHORTNESS OF BREATH 08/01/21   Binnie Rail, MD  ?amLODipine (NORVASC) 10 MG tablet TAKE 1 TABLET BY MOUTH  DAILY 07/09/21   Janith Lima, MD  ?budesonide (ENTOCORT EC) 3 MG 24 hr capsule Take 3 capsules (9 mg total) by mouth daily. 12/08/20   Ladene Artist, MD  ?buPROPion (WELLBUTRIN XL) 150 MG 24 hr tablet Take 1 tablet (150 mg total) by mouth daily. 06/21/21   Janith Lima, MD  ?Cholecalciferol (VITAMIN D3) 1.25 MG (50000 UT) CAPS TAKE 1 CAPSULE BY MOUTH  ONCE WEEKLY 08/08/21   Janith Lima, MD  ?clonazePAM (KLONOPIN) 0.5 MG tablet TAKE 1 TABLET (0.5 MG TOTAL) BY MOUTH 2 (TWO) TIMES DAILY AS NEEDED FOR ANXIETY. 01/14/21   Janith Lima, MD  ?[facility] PAIN RELIEF EXTRA STRENGTH 500 MG tablet TAKE 1 TABLET BY MOUTH EVERY 8 HOURS AS NEEDED FOR MODERATE PAIN 08/25/18   Janith Lima, MD   ?cyanocobalamin 2000 MCG tablet Take 1 tablet (2,000 mcg total) by mouth daily. 03/05/21   Janith Lima, MD  ?ezetimibe (ZETIA) 10 MG tablet Take 1 tablet (10 mg total) by mouth daily. Please schedule appt for future refills. 1st attempt 08/17/21   Jerline Pain, MD  ?fluticasone (FLONASE) 50 MCG/ACT nasal spray Place 2 sprays into both nostrils daily. 12/23/19   Janith Lima, MD  ?lamoTRIgine (LAMICTAL) 200 MG tablet TAKE 1 TABLET BY MOUTH AT BEDTIME. 08/01/21   Janith Lima, MD  ?nebivolol (BYSTOLIC) 10 MG tablet TAKE 1 TABLET BY MOUTH DAILY 08/17/21   Janith Lima, MD  ?Potassium Citrate 15 MEQ (1620 MG) TBCR TAKE 1 TABLET BY MOUTH IN   THE MORNING , AT NOON, AND  AT BEDTIME 03/10/21   Janith Lima, MD  ?spironolactone (ALDACTONE) 100 MG tablet TAKE 1 TABLET BY MOUTH DAILY 08/24/21   Janith Lima, MD  ?STIOLTO RESPIMAT 2.5-2.5 MCG/ACT AERS USE 2 INHALATIONS BY MOUTH  DAILY 11/02/20   Janith Lima, MD  ?   ? ?Allergies    ?Azathioprine, Crestor [rosuvastatin calcium], Lipitor [atorvastatin calcium], and Lisinopril   ? ?Review of Systems   ?Review of Systems  ?Constitutional:  Negative for chills and fever.  ?HENT:  Negative for congestion, sore throat, trouble swallowing and voice change.   ?  Respiratory:  Positive for cough. Negative for shortness of breath.   ?All other systems reviewed and are negative. ? ?Physical Exam ?Updated Vital Signs ?BP (!) 147/68 (BP Location: Right Arm)   Pulse (!) 52   Temp 98.9 ?F (37.2 ?C) (Oral)   Resp 16   LMP 05/14/1988   SpO2 100%  ?Physical Exam ? ?ED Results / Procedures / Treatments   ?Labs ?(all labs ordered are listed, but only abnormal results are displayed) ?Labs Reviewed  ?BASIC METABOLIC PANEL - Abnormal; Notable for the following components:  ?    Result Value  ? Potassium 3.2 (*)   ? Glucose, Bld 120 (*)   ? BUN 6 (*)   ? All other components within normal limits  ?CBC WITH DIFFERENTIAL/PLATELET - Abnormal; Notable for the following components:  ?  WBC 13.9 (*)   ? Hemoglobin 15.1 (*)   ? HCT 46.8 (*)   ? Neutro Abs 8.5 (*)   ? Monocytes Absolute 1.2 (*)   ? All other components within normal limits  ? ? ?EKG ?None ? ?Radiology ?No results found. ? ?Procedures ?Procedures  ? ? ?Medications Ordered in ED ?Medications  ?potassium chloride SA (KLOR-CON M) CR tablet 40 mEq (has no administration in time range)  ? ? ?ED Course/ Medical Decision Making/ A&P ?  ?                        ?Medical Decision Making ?Risk ?Prescription drug management. ? ? ?Medical Decision Making / ED Course ? ? ?This patient presents to the ED for concern of neck pain, this involves an extensive number of treatment options, and is a complaint that carries with it a high risk of complications and morbidity.  The differential diagnosis includes cervical radiculopathy, PTA, RPA, dental pain, mastoiditis, cervical strain, dissection ? ?MDM: ?66 year old female presents today for evaluation of left-sided neck pain.  Onset today.  States the pain is fleeting and lasts only a few seconds.  She denies associated neck swelling, fever, difficulty swallowing, pharyngitis.  Pain does not radiate down to either of the upper extremities.  Patient on exam is resting comfortably without acute distress.  States for the past 90 minutes she has not had any recurrence.  She is eager to go home.  EKG does not show any acute ischemic changes.  CBC does show mild leukocytosis however without left shift.  Unlikely to be an acute infection.  Given her history of upper respiratory infection we did discuss obtaining CT scan however she refuses given she does not have any concerning symptoms such as fever, difficulty swallowing, or swelling and would prefer to follow-up with her PCP.  Dissection considered however patient's pain is transient and episodic as opposed to constant.  Patient also has bilateral ear impaction.  Did offer irrigation however she states she defers this until she follows up with her PCP.   Patient is appropriate for discharge.  Discharged in stable condition.  Return precautions discussed.  Patient voices understanding and is in agreement with plan. ?Patient has history of ulcerative colitis and takes potassium supplements given chronic diarrhea.  She takes 15 mEq of potassium 3 times daily.  She states recently she did cut back on her dose.  We will provide 40 mEq given her hypokalemia of 3.2.  Chemistry panel otherwise unremarkable..  This can be rechecked when she follows up with her PCP. ? ? ?Lab Tests: ?-I ordered, reviewed, and interpreted labs.   ?  The pertinent results include:   ?Labs Reviewed  ?BASIC METABOLIC PANEL - Abnormal; Notable for the following components:  ?    Result Value  ? Potassium 3.2 (*)   ? Glucose, Bld 120 (*)   ? BUN 6 (*)   ? All other components within normal limits  ?CBC WITH DIFFERENTIAL/PLATELET - Abnormal; Notable for the following components:  ? WBC 13.9 (*)   ? Hemoglobin 15.1 (*)   ? HCT 46.8 (*)   ? Neutro Abs 8.5 (*)   ? Monocytes Absolute 1.2 (*)   ? All other components within normal limits  ?  ? ? ?EKG ? EKG Interpretation ? ?Date/Time:  Saturday August 26 2021 15:13:59 EDT ?Ventricular Rate:  52 ?PR Interval:  190 ?QRS Duration: 90 ?QT Interval:  414 ?QTC Calculation: 385 ?R Axis:   -13 ?Text Interpretation: Sinus bradycardia Otherwise normal ECG When compared with ECG of 19-Oct-2015 06:12, PREVIOUS ECG IS PRESENT Confirmed by Blanchie Dessert [phone]) on 08/26/2021 6:57:16 PM ?  ? ?  ? ? ? ?Medicines ordered and prescription drug management: ?Meds ordered this encounter  ?Medications  ? potassium chloride SA (KLOR-CON M) CR tablet 40 mEq  ?  ?-I have reviewed the patients home medicines and have made adjustments as needed ? ?Reevaluation: ?After the interventions noted above, I reevaluated the patient and found that they have :stayed the same ?Patient remained asymptomatic throughout her emergency room stay. ?Co morbidities that complicate the patient  evaluation ? ?Past Medical History:  ?Diagnosis Date  ? Allergy   ? Anxiety   ? Benign breast lumps   ? COPD (chronic obstructive pulmonary disease) ([place])   ? Crohn disease ([place])   ? Depression   ? Diabetes mellitus typ

## 2021-08-26 NOTE — ED Provider Triage Note (Signed)
Emergency Medicine Provider Triage Evaluation Note ? ?April May , a 66 y.o. female  was evaluated in triage.  Pt complains of left-sided neck pain.  Reports that this started acutely and felt like a sharp pain that only lasted a few seconds.  Reports that it happened a few minutes later again shooting up her neck towards her head.  Says since she has been here it is happened multiple times.  Denies any blurred vision, dizziness, lightheadedness, history of CVA.  No neck trauma. ? ?Review of Systems  ?Positive: Neck pain ?Negative: Headache, dizziness, blurred vision, problems with balance ? ?Physical Exam  ?BP (!) 147/68 (BP Location: Right Arm)   Pulse (!) 52   Temp 98.9 ?F (37.2 ?C) (Oral)   Resp 16   LMP 05/14/1988   SpO2 100%  ?Gen:   Awake, no distress   ?Resp:  Normal effort  ?MSK:   Moves extremities without difficulty  ?Other:  Tenderness along the left sided SCM.  Full range of motion of the neck without midline tenderness. ? ?Medical Decision Making  ?Medically screening exam initiated at 3:39 PM.  Appropriate orders placed.  April May was informed that the remainder of the evaluation will be completed by another provider, this initial triage assessment does not replace that evaluation, and the importance of remaining in the ED until their evaluation is complete. ? ? ? ?Low suspicion of dissection however labs ordered to assess kidney function in the event that patient has an angio in the back ?  ?Rhae Hammock, PA-C ?08/26/21 1541 ? ?

## 2021-09-06 ENCOUNTER — Ambulatory Visit (INDEPENDENT_AMBULATORY_CARE_PROVIDER_SITE_OTHER): Payer: Medicare Other

## 2021-09-06 DIAGNOSIS — E538 Deficiency of other specified B group vitamins: Secondary | ICD-10-CM

## 2021-09-06 MED ORDER — CYANOCOBALAMIN 1000 MCG/ML IJ SOLN
1000.0000 ug | Freq: Once | INTRAMUSCULAR | Status: AC
  Administered 2021-09-06: 1000 ug via INTRAMUSCULAR

## 2021-09-06 NOTE — Progress Notes (Signed)
After obtaining consent, and per orders of Dr. Ronnald Ramp, injection of B12 given by Max Sane. Patient tolerated injection well in left deltoid and to report any adverse reaction to me immediately.  ?

## 2021-09-07 NOTE — Progress Notes (Signed)
? ? ?Subjective:  ? ? Patient ID: April May, female    DOB: May 18, 1955, 66 y.o.   MRN: [id number] ? ?This visit occurred during the SARS-CoV-2 public health emergency.  Safety protocols were in place, including screening questions prior to the visit, additional usage of staff PPE, and extensive cleaning of exam room while observing appropriate contact time as indicated for disinfecting solutions. ? ? ? ?HPI ?April May is here for  ?Chief Complaint  ?Patient presents with  ? Neck Pain  ?  Sharp pain in neck that comes and goes  ? ? ? ?Neck pain - left sided neck pain started 4/15.  The pain is is in the left side of her neck below her ear ED middle neck.  The pain is intermittent and lasts a few seconds-1 minutes.  The pain does not occur daily.   It can occur 4-5 time a day.  The pain is sharp and makes her want to be still.  Nothing seems to cause it.  No posterior neck pain.  No pain in the arms.  No numbness or tingling.  No headaches.  She did go to the emergency room 4/15 And those notes were reviewed. ? ?She wonders if her ear is causing symptoms.  She was advised in the emergency room that she had a lot of wax and that should be cleaned out. ? ? ?Medications and allergies reviewed with patient and updated if appropriate. ? ?Current Outpatient Medications on File Prior to Visit  ?Medication Sig Dispense Refill  ? albuterol (PROAIR HFA) 108 (90 Base) MCG/ACT inhaler TAKE 2 PUFFS BY MOUTH EVERY 6 HOURS AS NEEDED FOR WHEEZE OR SHORTNESS OF BREATH 18 g 5  ? amLODipine (NORVASC) 10 MG tablet TAKE 1 TABLET BY MOUTH  DAILY 90 tablet 1  ? budesonide (ENTOCORT EC) 3 MG 24 hr capsule Take 3 capsules (9 mg total) by mouth daily. 270 capsule 3  ? buPROPion (WELLBUTRIN XL) 150 MG 24 hr tablet Take 1 tablet (150 mg total) by mouth daily. 90 tablet 1  ? Cholecalciferol (VITAMIN D3) 1.25 MG (50000 UT) CAPS TAKE 1 CAPSULE BY MOUTH  ONCE WEEKLY 12 capsule 0  ? clonazePAM (KLONOPIN) 0.5 MG tablet TAKE 1 TABLET (0.5  MG TOTAL) BY MOUTH 2 (TWO) TIMES DAILY AS NEEDED FOR ANXIETY. 60 tablet 1  ? [facility] PAIN RELIEF EXTRA STRENGTH 500 MG tablet TAKE 1 TABLET BY MOUTH EVERY 8 HOURS AS NEEDED FOR MODERATE PAIN 60 tablet 5  ? cyanocobalamin 2000 MCG tablet Take 1 tablet (2,000 mcg total) by mouth daily. 90 tablet 1  ? ezetimibe (ZETIA) 10 MG tablet Take 1 tablet (10 mg total) by mouth daily. Please schedule appt for future refills. 1st attempt 30 tablet 0  ? fluticasone (FLONASE) 50 MCG/ACT nasal spray Place 2 sprays into both nostrils daily. 48 g 3  ? lamoTRIgine (LAMICTAL) 200 MG tablet TAKE 1 TABLET BY MOUTH AT BEDTIME. 90 tablet 0  ? nebivolol (BYSTOLIC) 10 MG tablet TAKE 1 TABLET BY MOUTH DAILY 90 tablet 1  ? Potassium Citrate 15 MEQ (1620 MG) TBCR TAKE 1 TABLET BY MOUTH IN   THE MORNING , AT NOON, AND  AT BEDTIME 200 tablet 1  ? spironolactone (ALDACTONE) 100 MG tablet TAKE 1 TABLET BY MOUTH DAILY 90 tablet 0  ? STIOLTO RESPIMAT 2.5-2.5 MCG/ACT AERS USE 2 INHALATIONS BY MOUTH  DAILY 12 g 1  ? ?No current facility-administered medications on file prior to visit.  ? ? ?Review of  Systems  ?Constitutional:  Negative for fever.  ?HENT:  Negative for congestion, ear pain, hearing loss, sinus pressure, sinus pain and sore throat.   ?Respiratory:  Negative for cough.   ?Musculoskeletal:  Positive for neck pain. Negative for neck stiffness.  ?Skin:  Negative for rash.  ?Neurological:  Negative for weakness, light-headedness, numbness and headaches.  ? ?   ?Objective:  ? ?Vitals:  ? 09/08/21 0852  ?BP: 118/82  ?Pulse: (!) 52  ?Temp: 98.3 ?F (36.8 ?C)  ?SpO2: 98%  ? ?BP Readings from Last 3 Encounters:  ?09/08/21 118/82  ?08/26/21 126/89  ?08/01/21 140/80  ? ?Wt Readings from Last 3 Encounters:  ?09/08/21 227 lb (103 kg)  ?08/01/21 230 lb (104.3 kg)  ?06/21/21 226 lb (102.5 kg)  ? ?Body mass index is 44.33 kg/m?. ? ?  ?Physical Exam ?Constitutional:   ?   General: She is not in acute distress. ?   Appearance: Normal appearance.  ?HENT:  ?    Head: Normocephalic and atraumatic.  ?   Right Ear: Ear canal and external ear normal. There is impacted cerumen.  ?   Left Ear: Ear canal and external ear normal. There is impacted cerumen.  ?   Mouth/Throat:  ?   Mouth: Mucous membranes are moist.  ?   Pharynx: No oropharyngeal exudate or posterior oropharyngeal erythema.  ?Eyes:  ?   Conjunctiva/sclera: Conjunctivae normal.  ?Neck:  ?   Vascular: No carotid bruit.  ?Cardiovascular:  ?   Rate and Rhythm: Normal rate and regular rhythm.  ?   Heart sounds: Normal heart sounds. No murmur heard. ?Pulmonary:  ?   Effort: Pulmonary effort is normal. No respiratory distress.  ?   Breath sounds: Normal breath sounds. No wheezing.  ?Musculoskeletal:     ?   General: No deformity. Normal range of motion.  ?   Cervical back: Neck supple. No rigidity or tenderness.  ?   Right lower leg: No edema.  ?   Left lower leg: No edema.  ?   Comments: Left-sided neck is nontender to palpation.  Cervical spine and upper back nontender to palpation.  No increased pain with movement of the head.  ?Lymphadenopathy:  ?   Cervical: No cervical adenopathy.  ?Skin: ?   General: Skin is warm and dry.  ?   Findings: No rash.  ?Neurological:  ?   Mental Status: She is alert. Mental status is at baseline.  ?Psychiatric:     ?   Mood and Affect: Mood normal.     ?   Behavior: Behavior normal.  ? ?   ? ? ? ? ? ? ?Assessment & Plan:  ? ? ?Pain in the left side of neck: ?Acute ?Pain started 4/15 and is intermittent-not occurring daily, but can occur 4-5 times a day ?Pain is sharp in nature and lasts anywhere from 1 second to a few minutes ?No obvious cause, nothing seems to help the pain go away it just goes away on its own ?No posterior neck pain or radiculopathy ?Sounds like nerve related pain, but discussed with her it is hard to know for sure ?Discussed possible CT scan-she deferred for now and since it just started would like to monitor ?She will let us know if it persists or does not  resolve ?Reassured her this is not related to heart disease or indicative of a heart attack ? ?Hypertension: ?Chronic ?Blood pressure well controlled ?Continue amlodipine 10 mg daily, Bystolic 10 mg daily, spironolactone  100 mg daily ? ?Bilateral impacted cerumen: ?Acute ?Bilateral ears cleaned out by CMA ?Procedure tolerated well ? ? ? ? ?

## 2021-09-08 ENCOUNTER — Encounter: Payer: Self-pay | Admitting: Internal Medicine

## 2021-09-08 ENCOUNTER — Ambulatory Visit (INDEPENDENT_AMBULATORY_CARE_PROVIDER_SITE_OTHER): Payer: Medicare Other | Admitting: Internal Medicine

## 2021-09-08 VITALS — BP 118/82 | HR 52 | Temp 98.3°F | Ht 60.0 in | Wt 227.0 lb

## 2021-09-08 DIAGNOSIS — H6123 Impacted cerumen, bilateral: Secondary | ICD-10-CM

## 2021-09-08 DIAGNOSIS — M542 Cervicalgia: Secondary | ICD-10-CM

## 2021-09-08 NOTE — Progress Notes (Signed)
Patient consent obtained. ?Irrigation with water and peroxide performed on both ears. Full view of tympanic membranes after procedure.  ?Patient tolerated procedure well.  ? ?

## 2021-09-08 NOTE — Patient Instructions (Addendum)
? ? ? ? ?  Medications changes include :  none  ? ? ? ?If your pain does not go away or gets worse we should consider imaging.   ? ? ? ?Return if symptoms worsen or fail to improve. ? ?

## 2021-09-13 ENCOUNTER — Telehealth: Payer: Self-pay | Admitting: Acute Care

## 2021-09-13 DIAGNOSIS — F17210 Nicotine dependence, cigarettes, uncomplicated: Secondary | ICD-10-CM

## 2021-09-13 DIAGNOSIS — Z122 Encounter for screening for malignant neoplasm of respiratory organs: Secondary | ICD-10-CM

## 2021-09-13 DIAGNOSIS — Z87891 Personal history of nicotine dependence: Secondary | ICD-10-CM

## 2021-09-13 NOTE — Telephone Encounter (Signed)
Called to review results of LDCT.  No answer. Left VM for call back. ?

## 2021-09-14 NOTE — Telephone Encounter (Signed)
Unable to reach by phone to discuss LDCT results.  Left VM and call back information ?

## 2021-09-15 NOTE — Telephone Encounter (Signed)
Spoke with patient by phone to review recent LDCT results.  Atherosclerosis and emphysema noted as previous.  Noted an area in right lung that was fully inflating.  Patient advised to deep breathe several times a day.  Patient states she had recent congestion in lungs and was treated for infection around the time of her CT.  She had been coughing up phlegm.  She is feeling much better now.  Patient acknowledged understanding. Plan to place her back on annual CT scan.  Patient asked if any follow up is needed.  No follow up recommended for this. If congestion returns, shortness of breath or concerns, advised to contact PCP for recommendations.  Results faxed to PCP.  New order placed for 2024 scan.   ?

## 2021-09-19 ENCOUNTER — Encounter: Payer: Self-pay | Admitting: Internal Medicine

## 2021-09-26 DIAGNOSIS — G4733 Obstructive sleep apnea (adult) (pediatric): Secondary | ICD-10-CM | POA: Diagnosis not present

## 2021-10-02 ENCOUNTER — Encounter: Payer: Self-pay | Admitting: Internal Medicine

## 2021-10-06 ENCOUNTER — Ambulatory Visit (INDEPENDENT_AMBULATORY_CARE_PROVIDER_SITE_OTHER): Payer: Medicare Other

## 2021-10-06 DIAGNOSIS — E538 Deficiency of other specified B group vitamins: Secondary | ICD-10-CM

## 2021-10-06 MED ORDER — CYANOCOBALAMIN 1000 MCG/ML IJ SOLN
1000.0000 ug | Freq: Once | INTRAMUSCULAR | Status: AC
  Administered 2021-10-06: 1000 ug via INTRAMUSCULAR

## 2021-10-06 NOTE — Progress Notes (Signed)
Administered B12 1000 mcg/ml per PCP order into right deltoid. Patient tolerated injection well.

## 2021-10-08 ENCOUNTER — Other Ambulatory Visit: Payer: Self-pay | Admitting: Internal Medicine

## 2021-10-08 DIAGNOSIS — E559 Vitamin D deficiency, unspecified: Secondary | ICD-10-CM

## 2021-10-08 DIAGNOSIS — F321 Major depressive disorder, single episode, moderate: Secondary | ICD-10-CM

## 2021-10-19 ENCOUNTER — Ambulatory Visit: Payer: Medicare Other | Admitting: Internal Medicine

## 2021-10-23 ENCOUNTER — Ambulatory Visit: Payer: Medicare Other | Admitting: Internal Medicine

## 2021-10-30 ENCOUNTER — Ambulatory Visit (INDEPENDENT_AMBULATORY_CARE_PROVIDER_SITE_OTHER): Payer: Medicare Other | Admitting: Internal Medicine

## 2021-10-30 ENCOUNTER — Other Ambulatory Visit: Payer: Self-pay | Admitting: Cardiology

## 2021-10-30 ENCOUNTER — Telehealth: Payer: Self-pay | Admitting: Internal Medicine

## 2021-10-30 ENCOUNTER — Encounter: Payer: Self-pay | Admitting: Internal Medicine

## 2021-10-30 VITALS — BP 134/82 | HR 61 | Temp 98.2°F | Ht 60.0 in | Wt 220.0 lb

## 2021-10-30 DIAGNOSIS — I10 Essential (primary) hypertension: Secondary | ICD-10-CM

## 2021-10-30 DIAGNOSIS — F321 Major depressive disorder, single episode, moderate: Secondary | ICD-10-CM

## 2021-10-30 DIAGNOSIS — F3342 Major depressive disorder, recurrent, in full remission: Secondary | ICD-10-CM

## 2021-10-30 DIAGNOSIS — R7303 Prediabetes: Secondary | ICD-10-CM

## 2021-10-30 DIAGNOSIS — L680 Hirsutism: Secondary | ICD-10-CM | POA: Insufficient documentation

## 2021-10-30 DIAGNOSIS — E876 Hypokalemia: Secondary | ICD-10-CM | POA: Diagnosis not present

## 2021-10-30 DIAGNOSIS — E559 Vitamin D deficiency, unspecified: Secondary | ICD-10-CM

## 2021-10-30 DIAGNOSIS — T502X5A Adverse effect of carbonic-anhydrase inhibitors, benzothiadiazides and other diuretics, initial encounter: Secondary | ICD-10-CM | POA: Diagnosis not present

## 2021-10-30 DIAGNOSIS — F411 Generalized anxiety disorder: Secondary | ICD-10-CM

## 2021-10-30 DIAGNOSIS — Z23 Encounter for immunization: Secondary | ICD-10-CM

## 2021-10-30 LAB — CBC WITH DIFFERENTIAL/PLATELET
Basophils Absolute: 0 10*3/uL (ref 0.0–0.1)
Basophils Relative: 0.3 % (ref 0.0–3.0)
Eosinophils Absolute: 0.2 10*3/uL (ref 0.0–0.7)
Eosinophils Relative: 1.2 % (ref 0.0–5.0)
HCT: 47.9 % — ABNORMAL HIGH (ref 36.0–46.0)
Hemoglobin: 15.9 g/dL — ABNORMAL HIGH (ref 12.0–15.0)
Lymphocytes Relative: 27.9 % (ref 12.0–46.0)
Lymphs Abs: 4 10*3/uL (ref 0.7–4.0)
MCHC: 33.2 g/dL (ref 30.0–36.0)
MCV: 96.7 fl (ref 78.0–100.0)
Monocytes Absolute: 1.4 10*3/uL — ABNORMAL HIGH (ref 0.1–1.0)
Monocytes Relative: 9.6 % (ref 3.0–12.0)
Neutro Abs: 8.7 10*3/uL — ABNORMAL HIGH (ref 1.4–7.7)
Neutrophils Relative %: 61 % (ref 43.0–77.0)
Platelets: 244 10*3/uL (ref 150.0–400.0)
RBC: 4.95 Mil/uL (ref 3.87–5.11)
RDW: 14.6 % (ref 11.5–15.5)
WBC: 14.2 10*3/uL — ABNORMAL HIGH (ref 4.0–10.5)

## 2021-10-30 LAB — BASIC METABOLIC PANEL
BUN: 20 mg/dL (ref 6–23)
CO2: 26 mEq/L (ref 19–32)
Calcium: 10.1 mg/dL (ref 8.4–10.5)
Chloride: 103 mEq/L (ref 96–112)
Creatinine, Ser: 1.05 mg/dL (ref 0.40–1.20)
GFR: 55.56 mL/min — ABNORMAL LOW (ref >60.00)
Glucose, Bld: 97 mg/dL (ref 70–99)
Potassium: 4.4 mEq/L (ref 3.5–5.1)
Sodium: 137 mEq/L (ref 135–145)

## 2021-10-30 LAB — HEMOGLOBIN A1C: Hgb A1c MFr Bld: 6 % (ref 4.6–6.5)

## 2021-10-30 LAB — MAGNESIUM: Magnesium: 1.9 mg/dL (ref 1.5–2.5)

## 2021-10-30 MED ORDER — BUPROPION HCL ER (XL) 150 MG PO TB24: 150.0000 mg | ORAL_TABLET | Freq: Every day | ORAL | 1 refills | Status: DC

## 2021-10-30 MED ORDER — AMLODIPINE BESYLATE 10 MG PO TABS: 10.0000 mg | ORAL_TABLET | Freq: Every day | ORAL | 1 refills | Status: DC

## 2021-10-30 MED ORDER — CLONAZEPAM 0.5 MG PO TABS: 0.5000 mg | ORAL_TABLET | Freq: Two times a day (BID) | ORAL | 3 refills | Status: DC | PRN

## 2021-10-30 MED ORDER — POTASSIUM CITRATE ER 15 MEQ (1620 MG) PO TBCR: 1.0000 | EXTENDED_RELEASE_TABLET | Freq: Three times a day (TID) | ORAL | 0 refills | Status: DC

## 2021-10-30 MED ORDER — NEBIVOLOL HCL 10 MG PO TABS: 10.0000 mg | ORAL_TABLET | Freq: Every day | ORAL | 1 refills | Status: DC

## 2021-10-30 MED ORDER — VITAMIN D3 1.25 MG (50000 UT) PO CAPS: 1.0000 | ORAL_CAPSULE | ORAL | 0 refills | Status: DC

## 2021-10-30 MED ORDER — SPIRONOLACTONE 100 MG PO TABS: 100.0000 mg | ORAL_TABLET | Freq: Every day | ORAL | 0 refills | Status: DC

## 2021-10-30 MED ORDER — LAMOTRIGINE 200 MG PO TABS: 200.0000 mg | ORAL_TABLET | Freq: Every day | ORAL | 0 refills | Status: DC

## 2021-10-30 NOTE — Patient Instructions (Signed)
Hypertension, Adult High blood pressure (hypertension) is when the force of blood pumping through the arteries is too strong. The arteries are the blood vessels that carry blood from the heart throughout the body. Hypertension forces the heart to work harder to pump blood and may cause arteries to become narrow or stiff. Untreated or uncontrolled hypertension can lead to a heart attack, heart failure, a stroke, kidney disease, and other problems. A blood pressure reading consists of a higher number over a lower number. Ideally, your blood pressure should be below 120/80. The first ("top") number is called the systolic pressure. It is a measure of the pressure in your arteries as your heart beats. The second ("bottom") number is called the diastolic pressure. It is a measure of the pressure in your arteries as the heart relaxes. What are the causes? The exact cause of this condition is not known. There are some conditions that result in high blood pressure. What increases the risk? Certain factors may make you more likely to develop high blood pressure. Some of these risk factors are under your control, including: Smoking. Not getting enough exercise or physical activity. Being overweight. Having too much fat, sugar, calories, or salt (sodium) in your diet. Drinking too much alcohol. Other risk factors include: Having a personal history of heart disease, diabetes, high cholesterol, or kidney disease. Stress. Having a family history of high blood pressure and high cholesterol. Having obstructive sleep apnea. Age. The risk increases with age. What are the signs or symptoms? High blood pressure may not cause symptoms. Very high blood pressure (hypertensive crisis) may cause: Headache. Fast or irregular heartbeats (palpitations). Shortness of breath. Nosebleed. Nausea and vomiting. Vision changes. Severe chest pain, dizziness, and seizures. How is this diagnosed? This condition is diagnosed by  measuring your blood pressure while you are seated, with your arm resting on a flat surface, your legs uncrossed, and your feet flat on the floor. The cuff of the blood pressure monitor will be placed directly against the skin of your upper arm at the level of your heart. Blood pressure should be measured at least twice using the same arm. Certain conditions can cause a difference in blood pressure between your right and left arms. If you have a high blood pressure reading during one visit or you have normal blood pressure with other risk factors, you may be asked to: Return on a different day to have your blood pressure checked again. Monitor your blood pressure at home for 1 week or longer. If you are diagnosed with hypertension, you may have other blood or imaging tests to help your health care provider understand your overall risk for other conditions. How is this treated? This condition is treated by making healthy lifestyle changes, such as eating healthy foods, exercising more, and reducing your alcohol intake. You may be referred for counseling on a healthy diet and physical activity. Your health care provider may prescribe medicine if lifestyle changes are not enough to get your blood pressure under control and if: Your systolic blood pressure is above 130. Your diastolic blood pressure is above 80. Your personal target blood pressure may vary depending on your medical conditions, your age, and other factors. Follow these instructions at home: Eating and drinking  Eat a diet that is high in fiber and potassium, and low in sodium, added sugar, and fat. An example of this eating plan is called the DASH diet. DASH stands for Dietary Approaches to Stop Hypertension. To eat this way: Eat   plenty of fresh fruits and vegetables. Try to fill one half of your plate at each meal with fruits and vegetables. Eat whole grains, such as whole-wheat pasta, brown rice, or whole-grain bread. Fill about one  fourth of your plate with whole grains. Eat or drink low-fat dairy products, such as skim milk or low-fat yogurt. Avoid fatty cuts of meat, processed or cured meats, and poultry with skin. Fill about one fourth of your plate with lean proteins, such as fish, chicken without skin, beans, eggs, or tofu. Avoid pre-made and processed foods. These tend to be higher in sodium, added sugar, and fat. Reduce your daily sodium intake. Many people with hypertension should eat less than 1,500 mg of sodium a day. Do not drink alcohol if: Your health care provider tells you not to drink. You are pregnant, may be pregnant, or are planning to become pregnant. If you drink alcohol: Limit how much you have to: 0-1 drink a day for women. 0-2 drinks a day for men. Know how much alcohol is in your drink. In the [place] one drink equals one 12 oz bottle of beer (355 mL), one 5 oz glass of wine (148 mL), or one 1 oz glass of hard liquor (44 mL). Lifestyle  Work with your health care provider to maintain a healthy body weight or to lose weight. Ask what an ideal weight is for you. Get at least 30 minutes of exercise that causes your heart to beat faster (aerobic exercise) most days of the week. Activities may include walking, swimming, or biking. Include exercise to strengthen your muscles (resistance exercise), such as Pilates or lifting weights, as part of your weekly exercise routine. Try to do these types of exercises for 30 minutes at least 3 days a week. Do not use any products that contain nicotine or tobacco. These products include cigarettes, chewing tobacco, and vaping devices, such as e-cigarettes. If you need help quitting, ask your health care provider. Monitor your blood pressure at home as told by your health care provider. Keep all follow-up visits. This is important. Medicines Take over-the-counter and prescription medicines only as told by your health care provider. Follow directions carefully. Blood  pressure medicines must be taken as prescribed. Do not skip doses of blood pressure medicine. Doing this puts you at risk for problems and can make the medicine less effective. Ask your health care provider about side effects or reactions to medicines that you should watch for. Contact a health care provider if you: Think you are having a reaction to a medicine you are taking. Have headaches that keep coming back (recurring). Feel dizzy. Have swelling in your ankles. Have trouble with your vision. Get help right away if you: Develop a severe headache or confusion. Have unusual weakness or numbness. Feel faint. Have severe pain in your chest or abdomen. Vomit repeatedly. Have trouble breathing. These symptoms may be an emergency. Get help right away. Call 911. Do not wait to see if the symptoms will go away. Do not drive yourself to the hospital. Summary Hypertension is when the force of blood pumping through your arteries is too strong. If this condition is not controlled, it may put you at risk for serious complications. Your personal target blood pressure may vary depending on your medical conditions, your age, and other factors. For most people, a normal blood pressure is less than 120/80. Hypertension is treated with lifestyle changes, medicines, or a combination of both. Lifestyle changes include losing weight, eating a healthy,   low-sodium diet, exercising more, and limiting alcohol. This information is not intended to replace advice given to you by your health care provider. Make sure you discuss any questions you have with your health care provider. Document Revised: 03/07/2021 Document Reviewed: 03/07/2021 Elsevier Patient Education  2023 [employer] Inc[employer]  

## 2021-10-30 NOTE — Telephone Encounter (Signed)
Pt called stating Cholecalciferol (VITAMIN D3) 1.25 MG (50000 UT) CAPS was not sent to pharmacy.  [facility]  [id number] [place]  [phone] Fax:  [phone]

## 2021-10-30 NOTE — Progress Notes (Unsigned)
Subjective:  Patient ID: April May, female    DOB: 1956-04-10  Age: 66 y.o. MRN: [id number]  CC: Hypertension   HPI April May presents for ***  Outpatient Medications Prior to Visit  Medication Sig Dispense Refill   albuterol (PROAIR HFA) 108 (90 Base) MCG/ACT inhaler TAKE 2 PUFFS BY MOUTH EVERY 6 HOURS AS NEEDED FOR WHEEZE OR SHORTNESS OF BREATH 18 g 5   budesonide (ENTOCORT EC) 3 MG 24 hr capsule Take 3 capsules (9 mg total) by mouth daily. 270 capsule 3   Cholecalciferol (VITAMIN D3) 1.25 MG (50000 UT) CAPS TAKE 1 CAPSULE BY MOUTH ONCE  WEEKLY 12 capsule 0   [facility] PAIN RELIEF EXTRA STRENGTH 500 MG tablet TAKE 1 TABLET BY MOUTH EVERY 8 HOURS AS NEEDED FOR MODERATE PAIN 60 tablet 5   cyanocobalamin 2000 MCG tablet Take 1 tablet (2,000 mcg total) by mouth daily. 90 tablet 1   ezetimibe (ZETIA) 10 MG tablet Take 1 tablet (10 mg total) by mouth daily. Please schedule appt for future refills. 1st attempt 30 tablet 0   fluticasone (FLONASE) 50 MCG/ACT nasal spray Place 2 sprays into both nostrils daily. 48 g 3   STIOLTO RESPIMAT 2.5-2.5 MCG/ACT AERS USE 2 INHALATIONS BY MOUTH  DAILY 12 g 1   amLODipine (NORVASC) 10 MG tablet TAKE 1 TABLET BY MOUTH  DAILY 90 tablet 1   buPROPion (WELLBUTRIN XL) 150 MG 24 hr tablet Take 1 tablet (150 mg total) by mouth daily. 90 tablet 1   clonazePAM (KLONOPIN) 0.5 MG tablet TAKE 1 TABLET (0.5 MG TOTAL) BY MOUTH 2 (TWO) TIMES DAILY AS NEEDED FOR ANXIETY. 60 tablet 1   lamoTRIgine (LAMICTAL) 200 MG tablet TAKE 1 TABLET BY MOUTH AT  BEDTIME 90 tablet 0   nebivolol (BYSTOLIC) 10 MG tablet TAKE 1 TABLET BY MOUTH DAILY 90 tablet 1   Potassium Citrate 15 MEQ (1620 MG) TBCR TAKE 1 TABLET BY MOUTH IN   THE MORNING , AT NOON, AND  AT BEDTIME 200 tablet 1   spironolactone (ALDACTONE) 100 MG tablet TAKE 1 TABLET BY MOUTH DAILY 90 tablet 0   No facility-administered medications prior to visit.    ROS Review of Systems  Objective:  BP  134/82 (BP Location: Right Arm, Patient Position: Sitting, Cuff Size: Large)   Pulse 61   Temp 98.2 F (36.8 C) (Oral)   Ht 5' (1.524 m)   Wt 220 lb (99.8 kg)   LMP 05/14/1988   SpO2 95%   BMI 42.97 kg/m   BP Readings from Last 3 Encounters:  10/30/21 134/82  09/08/21 118/82  08/26/21 126/89    Wt Readings from Last 3 Encounters:  10/30/21 220 lb (99.8 kg)  09/08/21 227 lb (103 kg)  08/01/21 230 lb (104.3 kg)    Physical Exam  Lab Results  Component Value Date   WBC 13.9 (H) 08/26/2021   HGB 15.1 (H) 08/26/2021   HCT 46.8 (H) 08/26/2021   PLT 304 08/26/2021   GLUCOSE 120 (H) 08/26/2021   CHOL 109 06/21/2021   TRIG 77.0 06/21/2021   HDL 48.70 06/21/2021   LDLCALC 45 06/21/2021   ALT 18 06/21/2021   AST 14 06/21/2021   NA 141 08/26/2021   K 3.2 (L) 08/26/2021   CL 108 08/26/2021   CREATININE 0.86 08/26/2021   BUN 6 (L) 08/26/2021   CO2 26 08/26/2021   TSH 1.70 06/21/2021   HGBA1C 5.7 06/21/2020   MICROALBUR <0.7 06/10/2019    No  results found.  Assessment & Plan:   April May was seen today for hypertension.  Diagnoses and all orders for this visit:  Hirsutism -     CP Testosterone, BIO-Female/Children; Future  Recurrent major depressive disorder, in full remission (April May)  Current moderate episode of major depressive disorder, unspecified whether recurrent (April May) -     buPROPion (WELLBUTRIN XL) 150 MG 24 hr tablet; Take 1 tablet (150 mg total) by mouth daily. -     lamoTRIgine (LAMICTAL) 200 MG tablet; Take 1 tablet (200 mg total) by mouth at bedtime.  Malignant hypertension -     amLODipine (NORVASC) 10 MG tablet; Take 1 tablet (10 mg total) by mouth daily. -     nebivolol (BYSTOLIC) 10 MG tablet; Take 1 tablet (10 mg total) by mouth daily. -     spironolactone (ALDACTONE) 100 MG tablet; Take 1 tablet (100 mg total) by mouth daily. -     Potassium Citrate 15 MEQ (1620 MG) TBCR; Take 1 tablet by mouth in the morning, at noon, and at bedtime. -     Basic  metabolic panel; Future -     Magnesium; Future  Diuretic-induced hypokalemia -     spironolactone (ALDACTONE) 100 MG tablet; Take 1 tablet (100 mg total) by mouth daily. -     Potassium Citrate 15 MEQ (1620 MG) TBCR; Take 1 tablet by mouth in the morning, at noon, and at bedtime. -     Basic metabolic panel; Future -     Magnesium; Future  GAD (generalized anxiety disorder) -     clonazePAM (KLONOPIN) 0.5 MG tablet; Take 1 tablet (0.5 mg total) by mouth 2 (two) times daily as needed for anxiety.  Prediabetes -     Hemoglobin A1c; Future   I have changed April May's amLODipine, lamoTRIgine, nebivolol, spironolactone, and Potassium Citrate. I am also having her maintain her [facility] Pain Relief Extra Strength, fluticasone, Stiolto Respimat, budesonide, cyanocobalamin, albuterol, ezetimibe, Vitamin D3, buPROPion, and clonazePAM.  Meds ordered this encounter  Medications   amLODipine (NORVASC) 10 MG tablet    Sig: Take 1 tablet (10 mg total) by mouth daily.    Dispense:  90 tablet    Refill:  1   buPROPion (WELLBUTRIN XL) 150 MG 24 hr tablet    Sig: Take 1 tablet (150 mg total) by mouth daily.    Dispense:  90 tablet    Refill:  1   lamoTRIgine (LAMICTAL) 200 MG tablet    Sig: Take 1 tablet (200 mg total) by mouth at bedtime.    Dispense:  90 tablet    Refill:  0   nebivolol (BYSTOLIC) 10 MG tablet    Sig: Take 1 tablet (10 mg total) by mouth daily.    Dispense:  90 tablet    Refill:  1   spironolactone (ALDACTONE) 100 MG tablet    Sig: Take 1 tablet (100 mg total) by mouth daily.    Dispense:  90 tablet    Refill:  0   clonazePAM (KLONOPIN) 0.5 MG tablet    Sig: Take 1 tablet (0.5 mg total) by mouth 2 (two) times daily as needed for anxiety.    Dispense:  60 tablet    Refill:  3    This request is for a new prescription for a controlled substance as required by Federal/State law. DX Code Needed  .   Potassium Citrate 15 MEQ (1620 MG) TBCR    Sig: Take 1 tablet by  mouth  in the morning, at noon, and at bedtime.    Dispense:  200 tablet    Refill:  0     Follow-up: No follow-ups on file.  Scarlette Calico, MD

## 2021-10-31 ENCOUNTER — Encounter: Payer: Self-pay | Admitting: Internal Medicine

## 2021-10-31 MED ORDER — OZEMPIC (0.25 OR 0.5 MG/DOSE) 2 MG/3ML ~~LOC~~ SOPN: 0.2500 mg | PEN_INJECTOR | SUBCUTANEOUS | 0 refills | Status: DC

## 2021-10-31 MED ORDER — INSULIN PEN NEEDLE 32G X 6 MM MISC: 1.0000 | 1 refills | Status: DC

## 2021-11-01 ENCOUNTER — Encounter: Payer: Self-pay | Admitting: Internal Medicine

## 2021-11-01 NOTE — Telephone Encounter (Signed)
Pt called in to follow up on the messages she sent from today as well as yesterday.   Please call back ASAP.

## 2021-11-02 ENCOUNTER — Encounter: Payer: Self-pay | Admitting: Gastroenterology

## 2021-11-02 LAB — CP TESTOSTERONE, BIO-FEMALE/CHILDREN
Albumin: 4.2 g/dL (ref 3.6–5.1)
Sex Hormone Binding: 18.1 nmol/L (ref 14–73)
TESTOSTERONE, BIOAVAILABLE: 7.7 ng/dL (ref 0.5–8.5)
Testosterone, Free: 4 pg/mL (ref 0.2–5.0)
Testosterone, Total, LC-MS-MS: 22 ng/dL (ref 2–45)

## 2021-11-04 ENCOUNTER — Encounter: Payer: Self-pay | Admitting: Cardiology

## 2021-11-07 MED ORDER — SHINGRIX 50 MCG/0.5ML IM SUSR: 0.5000 mL | Freq: Once | INTRAMUSCULAR | 1 refills | Status: AC

## 2021-11-08 ENCOUNTER — Ambulatory Visit (INDEPENDENT_AMBULATORY_CARE_PROVIDER_SITE_OTHER): Payer: Medicare Other

## 2021-11-08 ENCOUNTER — Encounter: Payer: Self-pay | Admitting: Internal Medicine

## 2021-11-08 DIAGNOSIS — E538 Deficiency of other specified B group vitamins: Secondary | ICD-10-CM | POA: Diagnosis not present

## 2021-11-08 DIAGNOSIS — E559 Vitamin D deficiency, unspecified: Secondary | ICD-10-CM

## 2021-11-08 MED ORDER — CYANOCOBALAMIN 1000 MCG/ML IJ SOLN
1000.0000 ug | Freq: Once | INTRAMUSCULAR | Status: AC
  Administered 2021-11-08: 1000 ug via INTRAMUSCULAR

## 2021-11-08 NOTE — Progress Notes (Signed)
After obtaining consent, and per orders of Dr. Ronnald Ramp, injection of B12 given by Max Sane. Patient tolerated injection well in right deltoid and instructed to report any adverse reaction to me immediately.

## 2021-11-09 ENCOUNTER — Other Ambulatory Visit: Payer: Self-pay | Admitting: Internal Medicine

## 2021-11-09 ENCOUNTER — Encounter: Payer: Self-pay | Admitting: Internal Medicine

## 2021-11-13 ENCOUNTER — Encounter: Payer: Self-pay | Admitting: Internal Medicine

## 2021-11-22 ENCOUNTER — Other Ambulatory Visit: Payer: Self-pay | Admitting: Cardiology

## 2021-11-24 ENCOUNTER — Ambulatory Visit (INDEPENDENT_AMBULATORY_CARE_PROVIDER_SITE_OTHER): Payer: Medicare Other

## 2021-11-24 DIAGNOSIS — Z0000 Encounter for general adult medical examination without abnormal findings: Secondary | ICD-10-CM | POA: Diagnosis not present

## 2021-11-24 NOTE — Patient Instructions (Signed)
April May , Thank you for taking time to come for your Medicare Wellness Visit. I appreciate your ongoing commitment to your health goals. Please review the following plan we discussed and let me know if I can assist you in the future.   Screening recommendations/referrals: Colonoscopy: 09/28/2017; due every 5 years Mammogram: 01/08/2021; due every year Bone Density: 12/30/2018; due every 5 years Recommended yearly ophthalmology/optometry visit for glaucoma screening and checkup Recommended yearly dental visit for hygiene and checkup  Vaccinations: Influenza vaccine: 01/30/2021 Pneumococcal vaccine: 02/24/2015, 08/20/2017 Tdap vaccine: 10/16/2013; due every  Shingles vaccine: never done   Covid-19: 07/18/2019, 08/08/2019, 03/04/2020  Advanced directives: No  Conditions/risks identified: Yes  Next appointment: Please schedule your next Medicare Wellness Visit with your Nurse Health Advisor in 1 year by calling [phone].   Preventive Care 66 Years and Older, Female Preventive care refers to lifestyle choices and visits with your health care provider that can promote health and wellness. What does preventive care include? A yearly physical exam. This is also called an annual well check. Dental exams once or twice a year. Routine eye exams. Ask your health care provider how often you should have your eyes checked. Personal lifestyle choices, including: Daily care of your teeth and gums. Regular physical activity. Eating a healthy diet. Avoiding tobacco and drug use. Limiting alcohol use. Practicing safe sex. Taking low-dose aspirin every day. Taking vitamin and mineral supplements as recommended by your health care provider. What happens during an annual well check? The services and screenings done by your health care provider during your annual well check will depend on your age, overall health, lifestyle risk factors, and family history of disease. Counseling  Your health care  provider may ask you questions about your: Alcohol use. Tobacco use. Drug use. Emotional well-being. Home and relationship well-being. Sexual activity. Eating habits. History of falls. Memory and ability to understand (cognition). Work and work Statistician. Reproductive health. Screening  You may have the following tests or measurements: Height, weight, and BMI. Blood pressure. Lipid and cholesterol levels. These may be checked every 5 years, or more frequently if you are over 66 years old. Skin check. Lung cancer screening. You may have this screening every year starting at age 66 if you have a 30-pack-year history of smoking and currently smoke or have quit within the past 15 years. Fecal occult blood test (FOBT) of the stool. You may have this test every year starting at age 66. Flexible sigmoidoscopy or colonoscopy. You may have a sigmoidoscopy every 5 years or a colonoscopy every 10 years starting at age 66. Hepatitis C blood test. Hepatitis B blood test. Sexually transmitted disease (STD) testing. Diabetes screening. This is done by checking your blood sugar (glucose) after you have not eaten for a while (fasting). You may have this done every 1-3 years. Bone density scan. This is done to screen for osteoporosis. You may have this done starting at age 66. Mammogram. This may be done every 1-2 years. Talk to your health care provider about how often you should have regular mammograms. Talk with your health care provider about your test results, treatment options, and if necessary, the need for more tests. Vaccines  Your health care provider may recommend certain vaccines, such as: Influenza vaccine. This is recommended every year. Tetanus, diphtheria, and acellular pertussis (Tdap, Td) vaccine. You may need a Td booster every 10 years. Zoster vaccine. You may need this after age 66. Pneumococcal 13-valent conjugate (PCV13) vaccine. One dose is recommended  after age  66. Pneumococcal polysaccharide (PPSV23) vaccine. One dose is recommended after age 66. Talk to your health care provider about which screenings and vaccines you need and how often you need them. This information is not intended to replace advice given to you by your health care provider. Make sure you discuss any questions you have with your health care provider. Document Released: 05/27/2015 Document Revised: 01/18/2016 Document Reviewed: 03/01/2015 Elsevier Interactive Patient Education  2017 [place] Prevention in the Home Falls can cause injuries. They can happen to people of all ages. There are many things you can do to make your home safe and to help prevent falls. What can I do on the outside of my home? Regularly fix the edges of walkways and driveways and fix any cracks. Remove anything that might make you trip as you walk through a door, such as a raised step or threshold. Trim any bushes or trees on the path to your home. Use bright outdoor lighting. Clear any walking paths of anything that might make someone trip, such as rocks or tools. Regularly check to see if handrails are loose or broken. Make sure that both sides of any steps have handrails. Any raised decks and porches should have guardrails on the edges. Have any leaves, snow, or ice cleared regularly. Use sand or salt on walking paths during winter. Clean up any spills in your garage right away. This includes oil or grease spills. What can I do in the bathroom? Use night lights. Install grab bars by the toilet and in the tub and shower. Do not use towel bars as grab bars. Use non-skid mats or decals in the tub or shower. If you need to sit down in the shower, use a plastic, non-slip stool. Keep the floor dry. Clean up any water that spills on the floor as soon as it happens. Remove soap buildup in the tub or shower regularly. Attach bath mats securely with double-sided non-slip rug tape. Do not have throw  rugs and other things on the floor that can make you trip. What can I do in the bedroom? Use night lights. Make sure that you have a light by your bed that is easy to reach. Do not use any sheets or blankets that are too big for your bed. They should not hang down onto the floor. Have a firm chair that has side arms. You can use this for support while you get dressed. Do not have throw rugs and other things on the floor that can make you trip. What can I do in the kitchen? Clean up any spills right away. Avoid walking on wet floors. Keep items that you use a lot in easy-to-reach places. If you need to reach something above you, use a strong step stool that has a grab bar. Keep electrical cords out of the way. Do not use floor polish or wax that makes floors slippery. If you must use wax, use non-skid floor wax. Do not have throw rugs and other things on the floor that can make you trip. What can I do with my stairs? Do not leave any items on the stairs. Make sure that there are handrails on both sides of the stairs and use them. Fix handrails that are broken or loose. Make sure that handrails are as long as the stairways. Check any carpeting to make sure that it is firmly attached to the stairs. Fix any carpet that is loose or worn. Avoid having throw  rugs at the top or bottom of the stairs. If you do have throw rugs, attach them to the floor with carpet tape. Make sure that you have a light switch at the top of the stairs and the bottom of the stairs. If you do not have them, ask someone to add them for you. What else can I do to help prevent falls? Wear shoes that: Do not have high heels. Have rubber bottoms. Are comfortable and fit you well. Are closed at the toe. Do not wear sandals. If you use a stepladder: Make sure that it is fully opened. Do not climb a closed stepladder. Make sure that both sides of the stepladder are locked into place. Ask someone to hold it for you, if  possible. Clearly mark and make sure that you can see: Any grab bars or handrails. First and last steps. Where the edge of each step is. Use tools that help you move around (mobility aids) if they are needed. These include: Canes. Walkers. Scooters. Crutches. Turn on the lights when you go into a dark area. Replace any light bulbs as soon as they burn out. Set up your furniture so you have a clear path. Avoid moving your furniture around. If any of your floors are uneven, fix them. If there are any pets around you, be aware of where they are. Review your medicines with your doctor. Some medicines can make you feel dizzy. This can increase your chance of falling. Ask your doctor what other things that you can do to help prevent falls. This information is not intended to replace advice given to you by your health care provider. Make sure you discuss any questions you have with your health care provider. Document Released: 02/24/2009 Document Revised: 10/06/2015 Document Reviewed: 06/04/2014 Elsevier Interactive Patient Education  2017 [employer].

## 2021-11-24 NOTE — Progress Notes (Signed)
I connected with April May today by telephone and verified that I am speaking with the correct person using two identifiers. Location patient: home Location provider: work Persons participating in the virtual visit: patient, provider.   I discussed the limitations, risks, security and privacy concerns of performing an evaluation and management service by telephone and the availability of in person appointments. I also discussed with the patient that there may be a patient responsible charge related to this service. The patient expressed understanding and verbally consented to this telephonic visit.    Interactive audio and video telecommunications were attempted between this provider and patient, however failed, due to patient having technical difficulties OR patient did not have access to video capability.  We continued and completed visit with audio only.  Some vital signs may be absent or patient reported.   Time Spent with patient on telephone encounter: 30 minutes  Subjective:   April May is a 66 y.o. female who presents for Medicare Annual (Subsequent) preventive examination.  Review of Systems     Cardiac Risk Factors include: advanced age (>56mn, >>72women);dyslipidemia;family history of premature cardiovascular disease;hypertension;obesity (BMI >30kg/m2);smoking/ tobacco exposure     Objective:    There were no vitals filed for this visit. There is no height or weight on file to calculate BMI.     11/24/2021   10:41 AM 11/22/2020    9:52 AM 05/20/2018   12:46 AM 09/25/2017    2:50 PM 05/30/2017    6:04 PM 03/12/2017    1:57 PM 01/31/2017    1:35 PM  Advanced Directives  Does Patient Have a Medical Advance Directive? No Yes No No No No No  Does patient want to make changes to medical advance directive?  No - Patient declined       Would patient like information on creating a medical advance directive? No - Patient declined  No - Patient declined  No -  Patient declined  Yes (ED - Information included in AVS)    Current Medications (verified) Outpatient Encounter Medications as of 11/24/2021  Medication Sig   albuterol (PROAIR HFA) 108 (90 Base) MCG/ACT inhaler TAKE 2 PUFFS BY MOUTH EVERY 6 HOURS AS NEEDED FOR WHEEZE OR SHORTNESS OF BREATH   amLODipine (NORVASC) 10 MG tablet Take 1 tablet (10 mg total) by mouth daily.   budesonide (ENTOCORT EC) 3 MG 24 hr capsule Take 3 capsules (9 mg total) by mouth daily.   buPROPion (WELLBUTRIN XL) 150 MG 24 hr tablet Take 1 tablet (150 mg total) by mouth daily.   Cholecalciferol (VITAMIN D3) 1.25 MG (50000 UT) CAPS Take 1 capsule by mouth once a week.   clonazePAM (KLONOPIN) 0.5 MG tablet Take 1 tablet (0.5 mg total) by mouth 2 (two) times daily as needed for anxiety.   [employer] PAIN RELIEF EXTRA STRENGTH 500 MG tablet TAKE 1 TABLET BY MOUTH EVERY 8 HOURS AS NEEDED FOR MODERATE PAIN   cyanocobalamin 2000 MCG tablet Take 1 tablet (2,000 mcg total) by mouth daily.   ezetimibe (ZETIA) 10 MG tablet Take 1 tablet (10 mg total) by mouth daily. CALL AND SCHEDULE FOLLOW UP OFFICE VISIT TO RECEIVE FURTHER REFILLS. [phone] 2ND ATTEMPT.   fluticasone (FLONASE) 50 MCG/ACT nasal spray Place 2 sprays into both nostrils daily.   Insulin Pen Needle 32G X 6 MM MISC 1 Act by Does not apply route once a week.   lamoTRIgine (LAMICTAL) 200 MG tablet Take 1 tablet (200 mg total) by mouth at bedtime.  nebivolol (BYSTOLIC) 10 MG tablet Take 1 tablet (10 mg total) by mouth daily.   Potassium Citrate 15 MEQ (1620 MG) TBCR Take 1 tablet by mouth in the morning, at noon, and at bedtime.   spironolactone (ALDACTONE) 100 MG tablet Take 1 tablet (100 mg total) by mouth daily.   STIOLTO RESPIMAT 2.5-2.5 MCG/ACT AERS USE 2 INHALATIONS BY MOUTH  DAILY   No facility-administered encounter medications on file as of 11/24/2021.    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   Vitamin D deficiency    Past Surgical History:  Procedure Laterality Date   ABDOMINAL HYSTERECTOMY     1 ovary removed   APPENDECTOMY     APPLICATION OF WOUND VAC  11/24/2014   Procedure: APPLICATION OF WOUND VAC;  Surgeon: Donnie Mesa, MD;  Location: [facility];  Service: General;;   BOWEL RESECTION N/A 11/24/2014   Procedure: SMALL BOWEL RESECTION;  Surgeon: Donnie Mesa, MD;  Location: [facility];  Service: General;  Laterality: N/A;   BREAST EXCISIONAL BIOPSY Right 1972   BREAST SURGERY Right 40 yrs ago   benign tumor removed   LAPAROTOMY N/A 11/24/2014   Procedure: EXPLORATORY LAPAROTOMY EXTENSIVE LYSIS OF ADHESIONS SAMLL BOWEL RESECTION RIGHT HEMI COLECTOMY WOUND VAC APPLICATION. ;  Surgeon: Donnie Mesa, MD;  Location: [facility];  Service: General;  Laterality: N/A;   LYSIS OF ADHESION  11/24/2014   Procedure: LYSIS OF ADHESION (3 HRS);  Surgeon: Donnie Mesa, MD;  Location: [facility];  Service: General;;   PARTIAL COLECTOMY  11/24/2014   Procedure: RIGHT HEMI COLECTOMY;  Surgeon: Donnie Mesa, MD;  Location: [facility];  Service: General;;   SMALL INTESTINE SURGERY  age 70   Family History  Problem Relation Age of Onset   Hypertension Mother    Ulcerative colitis Mother    Crohn's disease Mother    Heart disease Mother    Kidney disease Mother    Arthritis Father    Brain cancer Father    Diabetes Father    Alcohol abuse Paternal Uncle    Diabetes Paternal Grandmother    Stroke Paternal Grandmother    Heart failure Sister     Kidney disease Sister    Heart failure Brother    Other Sister        prediabetes   Kidney cancer Brother    Social History   Socioeconomic History   Marital status: Divorced    Spouse name: Not on file   Number of children: 2   Years of education: Not on file   Highest education level: 12th grade  Occupational History   Occupation: disabled  Tobacco Use   Smoking status: Every Day    Packs/day: 0.50    Years: 35.00    Total pack years: 17.50    Types: Cigarettes   Smokeless tobacco: Never   Tobacco comments:    2  cigarettes daily 12/08/20 MO   Vaping Use   Vaping Use: Never used  Substance and Sexual Activity   Alcohol use: No   Drug use: No   Sexual activity: Not Currently  Other Topics Concern   Not on file  Social History Narrative   Not on file   Social Determinants of Health   Financial Resource Strain: Low Risk  (11/24/2021)   Overall Financial Resource Strain (CARDIA)    Difficulty of Paying Living Expenses: Not hard at all  Food Insecurity: No Food Insecurity (11/24/2021)   Hunger Vital Sign    Worried About Running Out of Food in the Last Year: Never true    Ran Out of Food in the Last Year: Never true  Transportation Needs: No Transportation Needs (11/24/2021)   PRAPARE - Hydrologist (Medical): No    Lack of Transportation (Non-Medical): No  Physical Activity: Sufficiently Active (11/24/2021)   Exercise Vital Sign    Days of Exercise per Week: 5 days    Minutes of Exercise per Session: 30 min  Stress: No Stress Concern Present (11/24/2021)   [place]    Feeling of Stress : Not at all  Social Connections: Moderately Integrated (11/24/2021)   Social Connection and Isolation Panel [NHANES]    Frequency of Communication with Friends and Family: More than three times a week    Frequency of Social Gatherings with Friends and Family: Once a week    Attends  Religious Services: More than 4 times per year    Active Member of [employer] or Organizations: No    Attends Music therapist: More than 4 times per year    Marital Status: Divorced    Tobacco Counseling Ready to quit: Not Answered Counseling given: Not Answered Tobacco comments: 2 cigarettes daily 12/08/20 MO    Clinical Intake:  Pre-visit preparation completed: Yes  Pain : No/denies pain     BMI - recorded: 42.97 Nutritional Status: BMI > 30  Obese Nutritional Risks: None Diabetes: No  How often do you need to have someone help you when you read instructions, pamphlets, or other written materials from your doctor or pharmacy?: 1 - Never What is the last grade level you completed in school?: HSG; 2 years of college  Diabetic? no  Interpreter Needed?: No      Activities of Daily Living    11/24/2021   10:51 AM  In your present state of health, do you have any difficulty performing the following activities:  Hearing? 0  Vision? 0  Difficulty concentrating or making decisions? 0  Walking or climbing stairs? 0  Dressing or bathing? 0  Doing errands, shopping? 0  Preparing Food and eating ? N  Using the Toilet? N  In the past six months, have you accidently leaked urine? N  Do you have problems with loss of bowel control? N  Managing your Medications? N  Managing your Finances? N  Housekeeping or managing your Housekeeping? N    Patient Care Team: Janith Lima, MD as PCP - General (Internal Medicine) Jerline Pain, MD as PCP - Cardiology (Cardiology) Charlton Haws, [facility] as Pharmacist (Pharmacist)  Indicate any recent Medical Services you may have received from other than [facility] providers in the past year (date may be approximate).     Assessment:   This is a routine wellness examination for [place].  Hearing/Vision screen Hearing Screening - Comments:: Patient denied  any hearing difficulty.   No hearing aids.   Vision Screening -  Comments:: Patient does wear corrective lenses/contacts.  Eye exam done by: [facility]   Dietary issues and exercise activities discussed: Current Exercise Habits: Home exercise routine, Type of exercise: walking, Time (Minutes): 30, Frequency (Times/Week): 5, Weekly Exercise (Minutes/Week): 150, Intensity: Moderate, Exercise limited by: respiratory conditions(s)   Goals Addressed             This Visit's Progress    To lose at least 50-60 pounds.        Depression Screen    11/24/2021   10:39 AM 10/30/2021   10:30 AM 06/21/2021   10:02 AM 11/22/2020   10:09 AM 10/27/2020    1:17 PM 09/20/2020   11:16 AM 06/21/2020    8:44 AM  PHQ 2/9 Scores  PHQ - 2 Score 0 1 0 0 0 2 0  PHQ- 9 Score  6 0 0 0 7 0    Fall Risk    11/24/2021   10:37 AM 11/22/2020   10:12 AM 10/27/2020    1:17 PM 01/04/2020    1:57 PM 01/27/2019   11:20 AM  [place] in the past year? 0 0 0 0 0  Number falls in past yr: 0 0 0  0  Injury with Fall? 0 0 0 0 0  Risk for fall due to : No Fall Risks No Fall Risks  No Fall Risks   Follow up Falls evaluation completed Falls evaluation completed  Falls evaluation completed Falls evaluation completed    FALL RISK PREVENTION PERTAINING TO THE HOME:  Any stairs in or around the home? No  If so, are there any without handrails? No  Home free of loose throw rugs in walkways, pet beds, electrical cords, etc? Yes  Adequate lighting in your home to reduce risk of falls? Yes   ASSISTIVE DEVICES UTILIZED TO PREVENT FALLS:  Life alert? No  Use of a cane, walker or w/c? No  Grab bars in the bathroom? No  Shower chair or bench in shower? No  Elevated toilet seat or a handicapped toilet? No   TIMED UP AND GO:  Was the test performed? No .  Length of time to ambulate 10 feet: n/a sec.   Appearance of gait: Gait not evaluated during this visit.  Cognitive Function:        11/24/2021   10:53 AM  6CIT Screen  What Year? 0 points  What month? 0  points  What time? 0 points  Count back from 20 0 points  Months in reverse 0 points  Repeat phrase 0 points  Total Score 0 points    Immunizations Immunization History  Administered Date(s) Administered   Influenza Split 04/04/2011, 02/15/2012   Influenza Whole 04/30/2008, 04/01/2009   Influenza,inj,Quad PF,6+ Mos 02/24/2015, 03/21/2016, 01/31/2017, 01/08/2018, 01/08/2019, 01/22/2020, 01/30/2021   PFIZER(Purple Top)SARS-COV-2 Vaccination 07/18/2019, 08/08/2019, 03/04/2020   PPD Test 10/16/2013, 03/05/2018, 12/16/2020   Pneumococcal Conjugate-13 02/24/2015   Pneumococcal Polysaccharide-23 02/15/2012, 08/20/2017   Td 12/31/2008   Tdap 10/16/2013    TDAP status: Up to date  Flu Vaccine status: Up to date  Pneumococcal vaccine status: Up to date  Covid-19 vaccine status: Completed vaccines  Qualifies for Shingles Vaccine? Yes   Zostavax completed No   Shingrix Completed?: No.    Education has been provided regarding the importance of this vaccine. Patient has been advised to call insurance company to determine out of pocket expense  if they have not yet received this vaccine. Advised may also receive vaccine at local pharmacy or Health Dept. Verbalized acceptance and understanding.  Screening Tests Health Maintenance  Topic Date Due   Zoster Vaccines- Shingrix (1 of 2) Never done   COVID-19 Vaccine (4 - Pfizer series) 04/29/2020   FOOT EXAM  06/24/2021   OPHTHALMOLOGY EXAM  09/23/2021   Diabetic kidney evaluation - Urine ACR  12/22/2021   INFLUENZA VACCINE  12/12/2021   HEMOGLOBIN A1C  05/01/2022   Pneumonia Vaccine 44+ Years old (3 - PPSV23 or PCV20) 08/21/2022   COLONOSCOPY (Pts 45-33yr Insurance coverage will need to be confirmed)  09/29/2022   Diabetic kidney evaluation - GFR measurement  10/31/2022   MAMMOGRAM  12/20/2022   TETANUS/TDAP  10/17/2023   DEXA SCAN  Completed   Hepatitis C Screening  Completed   HPV VACCINES  Aged Out    Health Maintenance  Health  Maintenance Due  Topic Date Due   Zoster Vaccines- Shingrix (1 of 2) Never done   COVID-19 Vaccine (4 - Pfizer series) 04/29/2020   FOOT EXAM  06/24/2021   OPHTHALMOLOGY EXAM  09/23/2021   Diabetic kidney evaluation - Urine ACR  12/22/2021    Colorectal cancer screening: Type of screening: Colonoscopy. Completed 09/28/2017. Repeat every 5 years  Mammogram status: Completed 12/19/2020. Repeat every year  Bone Density status: Completed 12/30/2018. Results reflect: Bone density results: NORMAL. Repeat every 5 years.  Lung Cancer Screening: (Low Dose CT Chest recommended if Age 66-80years, 30 pack-year currently smoking OR have quit w/in 15years.) does not qualify.   Lung Cancer Screening Referral: no  Additional Screening:  Hepatitis C Screening: does qualify; Completed 01/14/2013  Vision Screening: Recommended annual ophthalmology exams for early detection of glaucoma and other disorders of the eye. Is the patient up to date with their annual eye exam?  Yes  Who is the provider or what is the name of the office in which the patient attends annual eye exams[facility] If pt is not established with a provider, would they like to be referred to a provider to establish care? No .   Dental Screening: Recommended annual dental exams for proper oral hygiene  Community Resource Referral / Chronic Care Management: CRR required this visit?  No   CCM required this visit?  No      Plan:     I have personally reviewed and noted the following in the patient's chart:   Medical and social history Use of alcohol, tobacco or illicit drugs  Current medications and supplements including opioid prescriptions.  Functional ability and status Nutritional status Physical activity Advanced directives List of other physicians Hospitalizations, surgeries, and ER visits in previous 12 months Vitals Screenings to include cognitive, depression, and falls Referrals and appointments  In  addition, I have reviewed and discussed with patient certain preventive protocols, quality metrics, and best practice recommendations. A written personalized care plan for preventive services as well as general preventive health recommendations were provided to patient.     SSheral Flow LPN   [id number]  Nurse Notes:  Patient is cogitatively intact. There were no vitals filed for this visit. There is no height or weight on file to calculate BMI. Patient stated that she has no issues with gait or balance; does not use any assistive devices. Medications reviewed with patient; no opioid use noted.

## 2021-12-08 ENCOUNTER — Ambulatory Visit (INDEPENDENT_AMBULATORY_CARE_PROVIDER_SITE_OTHER): Payer: Medicare Other | Admitting: *Deleted

## 2021-12-08 DIAGNOSIS — E538 Deficiency of other specified B group vitamins: Secondary | ICD-10-CM

## 2021-12-08 MED ORDER — CYANOCOBALAMIN 1000 MCG/ML IJ SOLN
1000.0000 ug | Freq: Once | INTRAMUSCULAR | Status: AC
  Administered 2021-12-08: 1000 ug via INTRAMUSCULAR

## 2021-12-08 NOTE — Progress Notes (Signed)
Patient here for her B12 injection. Given in right deltoid. Patient tolerated well.   Please co sign

## 2021-12-13 ENCOUNTER — Ambulatory Visit: Payer: Medicare Other | Admitting: Podiatry

## 2021-12-13 DIAGNOSIS — M722 Plantar fascial fibromatosis: Secondary | ICD-10-CM | POA: Diagnosis not present

## 2021-12-13 DIAGNOSIS — M21861 Other specified acquired deformities of right lower leg: Secondary | ICD-10-CM

## 2021-12-13 NOTE — Progress Notes (Signed)
Subjective:  Patient ID: April May, female    DOB: 1956-01-02,  MRN: [id number]  Chief Complaint  Patient presents with   Nail Problem    Nail trim right foot pain     66 y.o. female presents with the above complaint.  Patient presents with complaint of right heel pain.  Patient states painful to touch is progressive gotten worse.  She went to get it evaluated she has not seen and was prior to seeing me.  Hurts with ambulation pain scale 7 out of 10.  Dull achy in nature.  Post attic dyskinesia type symptoms.   Review of Systems: Negative except as noted in the HPI. Denies N/V/F/Ch.  Past Medical History:  Diagnosis Date   Allergy    Anxiety    Benign breast lumps    COPD (chronic obstructive pulmonary disease) ([place])    Crohn disease ([place])    Depression    Diabetes mellitus type II    borderline, md checks hemaglobin A 1 c at md office    Hypertension    Sleep apnea    does not use cpap since weight loss 1 and 1/2 years ago   Small bowel obstruction ([place]) 2016   Squamous cell carcinoma of arm, 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   Vitamin D deficiency     Current Outpatient Medications:    albuterol (PROAIR HFA) 108 (90 Base) MCG/ACT inhaler, TAKE 2 PUFFS BY MOUTH EVERY 6 HOURS AS NEEDED FOR WHEEZE OR SHORTNESS OF BREATH, Disp: 18 g, Rfl: 5   amLODipine (NORVASC) 10 MG tablet, Take 1 tablet (10 mg total) by mouth daily., Disp: 90 tablet, Rfl: 1   budesonide (ENTOCORT EC) 3 MG 24 hr capsule, Take 3 capsules (9 mg total) by mouth daily., Disp: 270 capsule, Rfl: 3   buPROPion (WELLBUTRIN XL) 150 MG 24 hr tablet, Take 1 tablet (150 mg total) by mouth daily., Disp: 90 tablet, Rfl: 1   Cholecalciferol (VITAMIN D3) 1.25 MG (50000 UT) CAPS, Take 1 capsule by mouth once a week., Disp: 12 capsule, Rfl: 0   clonazePAM (KLONOPIN) 0.5 MG tablet, Take 1 tablet (0.5 mg total) by mouth 2 (two) times daily as needed for anxiety., Disp: 60 tablet, Rfl: 3   [employer] PAIN RELIEF EXTRA STRENGTH 500 MG  tablet, TAKE 1 TABLET BY MOUTH EVERY 8 HOURS AS NEEDED FOR MODERATE PAIN, Disp: 60 tablet, Rfl: 5   cyanocobalamin 2000 MCG tablet, Take 1 tablet (2,000 mcg total) by mouth daily., Disp: 90 tablet, Rfl: 1   ezetimibe (ZETIA) 10 MG tablet, Take 1 tablet (10 mg total) by mouth daily. CALL AND SCHEDULE FOLLOW UP OFFICE VISIT TO RECEIVE FURTHER REFILLS. [phone]. 2ND ATTEMPT., Disp: 15 tablet, Rfl: 0   fluticasone (FLONASE) 50 MCG/ACT nasal spray, Place 2 sprays into both nostrils daily., Disp: 48 g, Rfl: 3   Insulin Pen Needle 32G X 6 MM MISC, 1 Act by Does not apply route once a week., Disp: 30 each, Rfl: 1   lamoTRIgine (LAMICTAL) 200 MG tablet, Take 1 tablet (200 mg total) by mouth at bedtime., Disp: 90 tablet, Rfl: 0   nebivolol (BYSTOLIC) 10 MG tablet, Take 1 tablet (10 mg total) by mouth daily., Disp: 90 tablet, Rfl: 1   Potassium Citrate 15 MEQ (1620 MG) TBCR, Take 1 tablet by mouth in the morning, at noon, and at bedtime., Disp: 200 tablet, Rfl: 0   spironolactone (ALDACTONE) 100 MG tablet, Take 1 tablet (100 mg total) by mouth daily., Disp: 90 tablet, Rfl:  0   STIOLTO RESPIMAT 2.5-2.5 MCG/ACT AERS, USE 2 INHALATIONS BY MOUTH  DAILY, Disp: 12 g, Rfl: 1  Social History   Tobacco Use  Smoking Status Every Day   Packs/day: 0.50   Years: 35.00   Total pack years: 17.50   Types: Cigarettes  Smokeless Tobacco Never  Tobacco Comments   2 cigarettes daily 12/08/20 MO     Allergies  Allergen Reactions   Azathioprine Other (See Comments)    Side effects to pancrease      Crestor [Rosuvastatin Calcium] Other (See Comments)    Muscle and joint aches   Lipitor [Atorvastatin Calcium] Other (See Comments)    Muscle cramps   Lisinopril Cough   Objective:  There were no vitals filed for this visit. There is no height or weight on file to calculate BMI. Constitutional Well developed. Well nourished.  Vascular Dorsalis pedis pulses palpable bilaterally. Posterior tibial pulses palpable  bilaterally. Capillary refill normal to all digits.  No cyanosis or clubbing noted. Pedal hair growth normal.  Neurologic Normal speech. Oriented to person, place, and time. Epicritic sensation to light touch grossly present bilaterally.  Dermatologic Nails well groomed and normal in appearance. No open wounds. No skin lesions.  Orthopedic: Normal joint ROM without pain or crepitus bilaterally. No visible deformities. Tender to palpation at the calcaneal tuber right. No pain with calcaneal squeeze right. Ankle ROM diminished range of motion right. Silf[place]old Test: positive right.   Radiographs: None  Assessment:   1. Plantar fasciitis of right foot   2. Gastrocnemius equinus, right    Plan:  Patient was evaluated and treated and all questions answered.  Plantar Fasciitis, right with gastrocnemius equinus - XR reviewed as above.  - Educated on icing and stretching. Instructions given.  - Injection delivered to the plantar fascia as below. - DME: Plantar fascial brace dispensed to support the medial longitudinal arch of the foot and offload pressure from the heel and prevent arch collapse during weightbearing - Pharmacologic management: None  Procedure: Injection Tendon/Ligament Location: Right plantar fascia at the glabrous junction; medial approach. Skin Prep: alcohol Injectate: 0.5 cc 0.5% marcaine plain, 0.5 cc of 1% Lidocaine, 0.5 cc kenalog 10. Disposition: Patient tolerated procedure well. Injection site dressed with a band-aid.  No follow-ups on file.

## 2021-12-16 ENCOUNTER — Other Ambulatory Visit: Payer: Self-pay | Admitting: Internal Medicine

## 2021-12-16 DIAGNOSIS — E876 Hypokalemia: Secondary | ICD-10-CM

## 2021-12-16 DIAGNOSIS — I10 Essential (primary) hypertension: Secondary | ICD-10-CM

## 2021-12-20 ENCOUNTER — Other Ambulatory Visit: Payer: Self-pay | Admitting: Internal Medicine

## 2021-12-20 ENCOUNTER — Encounter: Payer: Self-pay | Admitting: *Deleted

## 2021-12-20 DIAGNOSIS — F321 Major depressive disorder, single episode, moderate: Secondary | ICD-10-CM

## 2021-12-21 ENCOUNTER — Other Ambulatory Visit: Payer: Self-pay | Admitting: Internal Medicine

## 2021-12-21 DIAGNOSIS — J449 Chronic obstructive pulmonary disease, unspecified: Secondary | ICD-10-CM

## 2021-12-23 ENCOUNTER — Other Ambulatory Visit: Payer: Self-pay | Admitting: Internal Medicine

## 2021-12-23 DIAGNOSIS — E876 Hypokalemia: Secondary | ICD-10-CM

## 2021-12-23 DIAGNOSIS — I10 Essential (primary) hypertension: Secondary | ICD-10-CM

## 2021-12-24 ENCOUNTER — Other Ambulatory Visit: Payer: Self-pay | Admitting: Cardiology

## 2021-12-25 DIAGNOSIS — G4733 Obstructive sleep apnea (adult) (pediatric): Secondary | ICD-10-CM | POA: Diagnosis not present

## 2022-01-02 ENCOUNTER — Other Ambulatory Visit: Payer: Self-pay | Admitting: Internal Medicine

## 2022-01-02 ENCOUNTER — Telehealth: Payer: Self-pay | Admitting: Cardiology

## 2022-01-02 DIAGNOSIS — F321 Major depressive disorder, single episode, moderate: Secondary | ICD-10-CM

## 2022-01-02 DIAGNOSIS — E876 Hypokalemia: Secondary | ICD-10-CM

## 2022-01-02 DIAGNOSIS — I10 Essential (primary) hypertension: Secondary | ICD-10-CM

## 2022-01-02 NOTE — Telephone Encounter (Signed)
Per chart refill for Ezetimibe was sent 12/25/21 #30 1RF to [facility] and shows receipt confirmed by pharmacy.   Spoke with patient and she states she only received #15 from pharmacy. Advised her to contact [facility] to inquire. She will call them for more information.   Nothing further needed at this time.

## 2022-01-02 NOTE — Telephone Encounter (Signed)
*  STAT* If patient is at the pharmacy, call can be transferred to refill team.   1. Which medications need to be refilled? (please list name of each medication and dose if known) ezetimibe (ZETIA) 10 MG tablet  2. Which pharmacy/location (including street and city if local pharmacy) is medication to be sent to? [id number] [place], [place] - [address] AT [address]. Do they need a 30 day or 90 day supply? 30  Patient needs enough to last to her appt on 9/19

## 2022-01-08 ENCOUNTER — Ambulatory Visit (INDEPENDENT_AMBULATORY_CARE_PROVIDER_SITE_OTHER): Payer: Medicare Other

## 2022-01-08 DIAGNOSIS — E538 Deficiency of other specified B group vitamins: Secondary | ICD-10-CM

## 2022-01-08 MED ORDER — CYANOCOBALAMIN 1000 MCG/ML IJ SOLN
1000.0000 ug | Freq: Once | INTRAMUSCULAR | Status: AC
  Administered 2022-01-08: 1000 ug via INTRAMUSCULAR

## 2022-01-08 NOTE — Progress Notes (Signed)
B12 given Please cosign 

## 2022-01-16 NOTE — Progress Notes (Unsigned)
[facility] [address] [place]. [place] [address] [phone]       OFFICE FOLLOW UP NOTE  Ms. Benay Spice Date of Birth:  07-13-1955 Medical Record Number:  [id number]   Primary neurologist: Dr. Rexene Alberts Reason for visit: CPAP follow-up    SUBJECTIVE:   CHIEF COMPLAINT:  No chief complaint on file.   HPI:   Update 01/17/2022 JM: Patient returns for yearly CPAP compliance visit.  Previously seen by Dr. Rexene Alberts 10/2020 with CPAP compliance showing adequate compliance and optimal residual AHI.  Per patient preference/comfort, pressure increased to 9.            ROS:   14 system review of systems performed and negative with exception of those listed in HPI  PMH:  Past Medical History:  Diagnosis Date   Allergy    Anxiety    Benign breast lumps    COPD (chronic obstructive pulmonary disease) ([place])    Crohn disease ([place])    Depression    Diabetes mellitus type II    borderline, md checks hemaglobin A 1 c at md office    Hypertension    Sleep apnea    does not use cpap since weight loss 1 and 1/2 years ago   Small bowel obstruction ([place]) 2016   Squamous cell carcinoma of arm, 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   Vitamin D deficiency     PSH:  Past Surgical History:  Procedure Laterality Date   ABDOMINAL HYSTERECTOMY     1 ovary removed   APPENDECTOMY     APPLICATION OF WOUND VAC  11/24/2014   Procedure: APPLICATION OF WOUND VAC;  Surgeon: Donnie Mesa, MD;  Location: [facility];  Service: General;;   BOWEL RESECTION N/A 11/24/2014   Procedure: SMALL BOWEL RESECTION;  Surgeon: Donnie Mesa, MD;  Location: [facility];  Service: General;  Laterality: N/A;   BREAST EXCISIONAL BIOPSY Right 1972   BREAST SURGERY Right 40 yrs ago   benign tumor removed   LAPAROTOMY N/A 11/24/2014   Procedure: EXPLORATORY LAPAROTOMY EXTENSIVE LYSIS OF ADHESIONS SAMLL BOWEL RESECTION RIGHT HEMI COLECTOMY WOUND VAC APPLICATION. ;  Surgeon: Donnie Mesa, MD;  Location: [facility];   Service: General;  Laterality: N/A;   LYSIS OF ADHESION  11/24/2014   Procedure: LYSIS OF ADHESION (3 HRS);  Surgeon: Donnie Mesa, MD;  Location: [facility];  Service: General;;   PARTIAL COLECTOMY  11/24/2014   Procedure: RIGHT HEMI COLECTOMY;  Surgeon: Donnie Mesa, MD;  [facility];  Service: General;;   SMALL INTESTINE SURGERY  age 31    Social History:  Social History   Socioeconomic History   Marital status: Divorced    Spouse name: Not on file   Number of children: 2   Years of education: Not on file   Highest education level: 12th grade  Occupational History   Occupation: disabled  Tobacco Use   Smoking status: Every Day    Packs/day: 0.50    Years: 35.00    Total pack years: 17.50    Types: Cigarettes   Smokeless tobacco: Never   Tobacco comments:    2 cigarettes daily 12/08/20 MO   Vaping Use   Vaping Use: Never used  Substance and Sexual Activity   Alcohol use: No   Drug use: No   Sexual activity: Not Currently  Other Topics Concern   Not on file  Social History Narrative   Not on file   Social Determinants of Health   Financial Resource Strain: Low  Risk  (11/24/2021)   Overall Financial Resource Strain (CARDIA)    Difficulty of Paying Living Expenses: Not hard at all  Food Insecurity: No Food Insecurity (11/24/2021)   Hunger Vital Sign    Worried About Running Out of Food in the Last Year: Never true    Ran Out of Food in the Last Year: Never true  Transportation Needs: No Transportation Needs (11/24/2021)   PRAPARE - Hydrologist (Medical): No    Lack of Transportation (Non-Medical): No  Physical Activity: Sufficiently Active (11/24/2021)   Exercise Vital Sign    Days of Exercise per Week: 5 days    Minutes of Exercise per Session: 30 min  Stress: No Stress Concern Present (11/24/2021)   [place]    Feeling of Stress : Not at all  Social Connections:  Moderately Integrated (11/24/2021)   Social Connection and Isolation Panel [NHANES]    Frequency of Communication with Friends and Family: More than three times a week    Frequency of Social Gatherings with Friends and Family: Once a week    Attends Religious Services: More than 4 times per year    Active Member of [employer] or Organizations: No    Attends Music therapist: More than 4 times per year    Marital Status: Divorced  Human resources officer Violence: Not At Risk (11/22/2020)   Humiliation, Afraid, Rape, and Kick questionnaire    Fear of Current or Ex-Partner: No    Emotionally Abused: No    Physically Abused: No    Sexually Abused: No    Family History:  Family History  Problem Relation Age of Onset   Hypertension Mother    Ulcerative colitis Mother    Crohn's disease Mother    Heart disease Mother    Kidney disease Mother    Arthritis Father    Brain cancer Father    Diabetes Father    Alcohol abuse Paternal Uncle    Diabetes Paternal Grandmother    Stroke Paternal Grandmother    Heart failure Sister    Kidney disease Sister    Heart failure Brother    Other Sister        prediabetes   Kidney cancer Brother     Medications:   Current Outpatient Medications on File Prior to Visit  Medication Sig Dispense Refill   albuterol (PROAIR HFA) 108 (90 Base) MCG/ACT inhaler TAKE 2 PUFFS BY MOUTH EVERY 6 HOURS AS NEEDED FOR WHEEZE OR SHORTNESS OF BREATH 18 g 5   amLODipine (NORVASC) 10 MG tablet Take 1 tablet (10 mg total) by mouth daily. 90 tablet 1   budesonide (ENTOCORT EC) 3 MG 24 hr capsule Take 3 capsules (9 mg total) by mouth daily. 270 capsule 3   buPROPion (WELLBUTRIN XL) 150 MG 24 hr tablet Take 1 tablet (150 mg total) by mouth daily. 90 tablet 1   Cholecalciferol (VITAMIN D3) 1.25 MG (50000 UT) CAPS Take 1 capsule by mouth once a week. 12 capsule 0   clonazePAM (KLONOPIN) 0.5 MG tablet Take 1 tablet (0.5 mg total) by mouth 2 (two) times daily as needed for  anxiety. 60 tablet 3   [employer] PAIN RELIEF EXTRA STRENGTH 500 MG tablet TAKE 1 TABLET BY MOUTH EVERY 8 HOURS AS NEEDED FOR MODERATE PAIN 60 tablet 5   cyanocobalamin 2000 MCG tablet Take 1 tablet (2,000 mcg total) by mouth daily. 90 tablet 1   ezetimibe (ZETIA)  10 MG tablet TAKE 1 TABLET BY MOUTH DAILY. 30 tablet 1   fluticasone (FLONASE) 50 MCG/ACT nasal spray Place 2 sprays into both nostrils daily. 48 g 3   Insulin Pen Needle 32G X 6 MM MISC 1 Act by Does not apply route once a week. 30 each 1   lamoTRIgine (LAMICTAL) 200 MG tablet TAKE 1 TABLET BY MOUTH AT  BEDTIME 100 tablet 1   nebivolol (BYSTOLIC) 10 MG tablet Take 1 tablet (10 mg total) by mouth daily. 90 tablet 1   Potassium Citrate 15 MEQ (1620 MG) TBCR TAKE 1 TABLET BY MOUTH IN THE  MORNING , AT NOON, AND AT  BEDTIME 200 tablet 1   spironolactone (ALDACTONE) 100 MG tablet TAKE 1 TABLET BY MOUTH DAILY 100 tablet 1   STIOLTO RESPIMAT 2.5-2.5 MCG/ACT AERS USE 2 INHALATIONS BY MOUTH  DAILY 12 g 1   No current facility-administered medications on file prior to visit.    Allergies:   Allergies  Allergen Reactions   Azathioprine Other (See Comments)    Side effects to pancrease      Crestor [Rosuvastatin Calcium] Other (See Comments)    Muscle and joint aches   Lipitor [Atorvastatin Calcium] Other (See Comments)    Muscle cramps   Lisinopril Cough      OBJECTIVE:  Physical Exam  There were no vitals filed for this visit. There is no height or weight on file to calculate BMI. No results found.   General: well developed, well nourished, seated, in no evident distress Head: head normocephalic and atraumatic.   Neck: supple with no carotid or supraclavicular bruits Cardiovascular: regular rate and rhythm, no murmurs Musculoskeletal: no deformity Skin:  no rash/petichiae Vascular:  Normal pulses all extremities   Neurologic Exam Mental Status: Awake and fully alert. Oriented to place and time. Recent and remote memory  intact. Attention span, concentration and fund of knowledge appropriate. Mood and affect appropriate.  Cranial Nerves: Pupils equal, briskly reactive to light. Extraocular movements full without nystagmus. Visual fields full to confrontation. Hearing intact. Facial sensation intact. Face, tongue, palate moves normally and symmetrically.  Motor: Normal bulk and tone. Normal strength in all tested extremity muscles Sensory.: intact to touch , pinprick , position and vibratory sensation.  Coordination: Rapid alternating movements normal in all extremities. Finger-to-nose and heel-to-shin performed accurately bilaterally. Gait and Station: Arises from chair without difficulty. Stance is normal. Gait demonstrates normal stride length and balance without use of AD. Tandem walk and heel toe without difficulty.  Reflexes: 1+ and symmetric. Toes downgoing.         ASSESSMENT/PLAN: April May is a 66 y.o. year old female     OSA on CPAP : Compliance report shows satisfactory usage with optimal residual AHI.  Continue current CPAP pressure at 9 and EPR level 3.  Discussed continued nightly usage with ensuring greater than 4 hours nightly for optimal benefit and per insurance purposes.  Continue to follow with DME company for any needed supplies or CPAP related concerns     Follow up in *** or call earlier if needed   CC:  PCP: Janith Lima, MD    I spent *** minutes of face-to-face and non-face-to-face time with patient.  This included previsit chart review, lab review, study review, order entry, electronic health record documentation, patient education regarding sleep apnea with review and discussion of compliance report and answered all other questions to patient's satisfaction   Frann Rider, AGNP-BC  [facility]  [address] [place], [place] [address]  Phone [phone] Fax [phone] Note: This document was prepared with digital  dictation and possible smart phrase technology. Any transcriptional errors that result from this process are unintentional.

## 2022-01-17 ENCOUNTER — Encounter: Payer: Self-pay | Admitting: Adult Health

## 2022-01-17 ENCOUNTER — Telehealth: Payer: Self-pay

## 2022-01-17 ENCOUNTER — Ambulatory Visit: Payer: Medicare Other | Admitting: Adult Health

## 2022-01-17 VITALS — BP 142/73 | HR 50 | Ht 60.0 in | Wt 225.0 lb

## 2022-01-17 DIAGNOSIS — G4733 Obstructive sleep apnea (adult) (pediatric): Secondary | ICD-10-CM | POA: Diagnosis not present

## 2022-01-17 DIAGNOSIS — Z9989 Dependence on other enabling machines and devices: Secondary | ICD-10-CM | POA: Diagnosis not present

## 2022-01-17 NOTE — Telephone Encounter (Signed)
New orders have been placed for the above pt, DOB: August 15, 1955 Community message sent to DME: [facility]

## 2022-01-19 ENCOUNTER — Ambulatory Visit: Payer: Medicare Other | Admitting: Podiatry

## 2022-01-25 DIAGNOSIS — G4733 Obstructive sleep apnea (adult) (pediatric): Secondary | ICD-10-CM | POA: Diagnosis not present

## 2022-01-26 ENCOUNTER — Encounter: Payer: Self-pay | Admitting: Internal Medicine

## 2022-01-26 ENCOUNTER — Other Ambulatory Visit: Payer: Self-pay | Admitting: Internal Medicine

## 2022-01-26 DIAGNOSIS — Z1231 Encounter for screening mammogram for malignant neoplasm of breast: Secondary | ICD-10-CM

## 2022-01-29 ENCOUNTER — Other Ambulatory Visit: Payer: Self-pay | Admitting: Internal Medicine

## 2022-01-29 DIAGNOSIS — R7303 Prediabetes: Secondary | ICD-10-CM

## 2022-01-29 DIAGNOSIS — Z111 Encounter for screening for respiratory tuberculosis: Secondary | ICD-10-CM

## 2022-01-29 MED ORDER — RYBELSUS 3 MG PO TABS: 3.0000 mg | ORAL_TABLET | Freq: Every day | ORAL | 0 refills | Status: DC

## 2022-01-30 ENCOUNTER — Ambulatory Visit: Payer: Medicare Other | Attending: Cardiology | Admitting: Cardiology

## 2022-01-30 ENCOUNTER — Encounter: Payer: Self-pay | Admitting: Cardiology

## 2022-01-30 VITALS — BP 130/80 | HR 53 | Ht 60.0 in | Wt 227.0 lb

## 2022-01-30 DIAGNOSIS — I2584 Coronary atherosclerosis due to calcified coronary lesion: Secondary | ICD-10-CM | POA: Diagnosis not present

## 2022-01-30 DIAGNOSIS — F17200 Nicotine dependence, unspecified, uncomplicated: Secondary | ICD-10-CM | POA: Diagnosis not present

## 2022-01-30 DIAGNOSIS — E782 Mixed hyperlipidemia: Secondary | ICD-10-CM

## 2022-01-30 DIAGNOSIS — Z789 Other specified health status: Secondary | ICD-10-CM | POA: Diagnosis not present

## 2022-01-30 DIAGNOSIS — I2510 Atherosclerotic heart disease of native coronary artery without angina pectoris: Secondary | ICD-10-CM | POA: Diagnosis not present

## 2022-01-30 NOTE — Patient Instructions (Signed)
Medication Instructions:  The current medical regimen is effective;  continue present plan and medications.  *If you need a refill on your cardiac medications before your next appointment, please call your pharmacy*  Follow-Up: At [facility], you and your health needs are our priority.  As part of our continuing mission to provide you with exceptional heart care, we have created designated Provider Care Teams.  These Care Teams include your primary Cardiologist (physician) and Advanced Practice Providers (APPs -  Physician Assistants and Nurse Practitioners) who all work together to provide you with the care you need, when you need it.  We recommend signing up for the patient portal called "MyChart".  Sign up information is provided on this After Visit Summary.  MyChart is used to connect with patients for Virtual Visits (Telemedicine).  Patients are able to view lab/test results, encounter notes, upcoming appointments, etc.  Non-urgent messages can be sent to your provider as well.   To learn more about what you can do with MyChart, go to [email or web address].    Your next appointment:   1 year(s)  The format for your next appointment:   In Person  Provider:   Candee Furbish, MD      Important Information About Sugar

## 2022-01-30 NOTE — Progress Notes (Signed)
Cardiology Office Note:    Date:  01/30/2022   ID:  April May, DOB 22-Jan-1956, MRN [id number]  PCP:  Janith Lima, MD   [facility] Providers Cardiologist:  Candee Furbish, MD     Referring MD: Janith Lima, MD     History of Present Illness:    April May is a 66 y.o. female here for the follow-up of coronary artery disease/coronary artery calcifications noted on lung cancer CT screening on 02/03/2020 with prediabetes hypertension bradycardia COPD tobacco use Crohn's disease.  She has been seen in the lipid clinic.  She had trialed rosuvastatin 10 mg a day but reported muscle pain in her right shoulder and arm.  Atorvastatin also caused muscle aches.  Livalo as well.  Today in her 1 year follow-up she is doing quite well.  From a lipid standpoint on Zetia 10 mg once a day.  Her last LDL was 45 on 06/21/2021.  Creatinine 1.05 HDL 48 hemoglobin A1c 6.0.  She denies any chest pain fevers chills nausea vomiting syncope bleeding.  She is still smoking.  She has several good questions about her coronary plaque.  Past Medical History:  Diagnosis Date   Allergy    Anxiety    Benign breast lumps    COPD (chronic obstructive pulmonary disease) ([place])    Crohn disease ([place])    Depression    Diabetes mellitus type II    borderline, md checks hemaglobin A 1 c at md office    Hypertension    Sleep apnea    does not use cpap since weight loss 1 and 1/2 years ago   Small bowel obstruction ([place]) 2016   Squamous cell carcinoma of arm, 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   Vitamin D deficiency     Past Surgical History:  Procedure Laterality Date   ABDOMINAL HYSTERECTOMY     1 ovary removed   APPENDECTOMY     APPLICATION OF WOUND VAC  11/24/2014   Procedure: APPLICATION OF WOUND VAC;  Surgeon: Donnie Mesa, MD;  [facility]: [facility];  Service: General;;   BOWEL RESECTION N/A 11/24/2014   Procedure: SMALL BOWEL RESECTION;  Surgeon: Donnie Mesa, MD;  Location: [facility];  Service:  General;  Laterality: N/A;   BREAST EXCISIONAL BIOPSY Right 1972   BREAST SURGERY Right 40 yrs ago   benign tumor removed   LAPAROTOMY N/A 11/24/2014   Procedure: EXPLORATORY LAPAROTOMY EXTENSIVE LYSIS OF ADHESIONS SAMLL BOWEL RESECTION RIGHT HEMI COLECTOMY WOUND VAC APPLICATION. ;  Surgeon: Donnie Mesa, MD;  [facility]: [facility];  Service: General;  Laterality: N/A;   LYSIS OF ADHESION  11/24/2014   Procedure: LYSIS OF ADHESION (3 HRS);  Surgeon: Donnie Mesa, MD;  [facility];  Service: General;;   PARTIAL COLECTOMY  11/24/2014   Procedure: RIGHT HEMI COLECTOMY;  Surgeon: Donnie Mesa, MD;  [facility]: [facility];  Service: General;;   SMALL INTESTINE SURGERY  age 89    Current Medications: Current Meds  Medication Sig   albuterol (PROAIR HFA) 108 (90 Base) MCG/ACT inhaler TAKE 2 PUFFS BY MOUTH EVERY 6 HOURS AS NEEDED FOR WHEEZE OR SHORTNESS OF BREATH   amLODipine (NORVASC) 10 MG tablet Take 1 tablet (10 mg total) by mouth daily.   budesonide (ENTOCORT EC) 3 MG 24 hr capsule Take 3 capsules (9 mg total) by mouth daily.   buPROPion (WELLBUTRIN XL) 150 MG 24 hr tablet Take 1 tablet (150 mg total) by mouth daily.   Cholecalciferol (VITAMIN D3) 1.25 MG (50000  UT) CAPS Take 1 capsule by mouth once a week.   clonazePAM (KLONOPIN) 0.5 MG tablet Take 1 tablet (0.5 mg total) by mouth 2 (two) times daily as needed for anxiety.   [employer]S PAIN RELIEF EXTRA STRENGTH 500 MG tablet TAKE 1 TABLET BY MOUTH EVERY 8 HOURS AS NEEDED FOR MODERATE PAIN   cyanocobalamin 2000 MCG tablet Take 1 tablet (2,000 mcg total) by mouth daily.   ezetimibe (ZETIA) 10 MG tablet TAKE 1 TABLET BY MOUTH DAILY.   fluticasone (FLONASE) 50 MCG/ACT nasal spray Place 2 sprays into both nostrils daily.   Insulin Pen Needle 32G X 6 MM MISC 1 Act by Does not apply route once a week.   lamoTRIgine (LAMICTAL) 200 MG tablet TAKE 1 TABLET BY MOUTH AT  BEDTIME   nebivolol (BYSTOLIC) 10 MG tablet Take 1 tablet (10 mg total) by mouth daily.    Potassium Citrate 15 MEQ (1620 MG) TBCR TAKE 1 TABLET BY MOUTH IN THE  MORNING , AT NOON, AND AT  BEDTIME   Semaglutide (RYBELSUS) 3 MG TABS Take 3 mg by mouth daily.   spironolactone (ALDACTONE) 100 MG tablet TAKE 1 TABLET BY MOUTH DAILY   STIOLTO RESPIMAT 2.5-2.5 MCG/ACT AERS USE 2 INHALATIONS BY MOUTH  DAILY     Allergies:   Azathioprine, Crestor [rosuvastatin calcium], Lipitor [atorvastatin calcium], and Lisinopril   Social History   Socioeconomic History   Marital status: Divorced    Spouse name: Not on file   Number of children: 2   Years of education: Not on file   Highest education level: 12th grade  Occupational History   Occupation: disabled  Tobacco Use   Smoking status: Every Day    Packs/day: 0.50    Years: 35.00    Total pack years: 17.50    Types: Cigarettes   Smokeless tobacco: Never   Tobacco comments:    2 cigarettes daily 12/08/20 MO   Vaping Use   Vaping Use: Never used  Substance and Sexual Activity   Alcohol use: No   Drug use: No   Sexual activity: Not Currently  Other Topics Concern   Not on file  Social History Narrative   Not on file   Social Determinants of Health   Financial Resource Strain: Low Risk  (11/24/2021)   Overall Financial Resource Strain (CARDIA)    Difficulty of Paying Living Expenses: Not hard at all  Food Insecurity: No Food Insecurity (11/24/2021)   Hunger Vital Sign    Worried About Running Out of Food in the Last Year: Never true    Ran Out of Food in the Last Year: Never true  Transportation Needs: No Transportation Needs (11/24/2021)   PRAPARE - Hydrologist (Medical): No    Lack of Transportation (Non-Medical): No  Physical Activity: Sufficiently Active (11/24/2021)   Exercise Vital Sign    Days of Exercise per Week: 5 days    Minutes of Exercise per Session: 30 min  Stress: No Stress Concern Present (11/24/2021)   [place]     Feeling of Stress : Not at all  Social Connections: Moderately Integrated (11/24/2021)   Social Connection and Isolation Panel [NHANES]    Frequency of Communication with Friends and Family: More than three times a week    Frequency of Social Gatherings with Friends and Family: Once a week    Attends Religious Services: More than 4 times per year    Active  Member of Clubs or Organizations: No    Attends Music therapist: More than 4 times per year    Marital Status: Divorced     Family History: The patient's family history includes Alcohol abuse in her paternal uncle; Arthritis in her father; Brain cancer in her father; Crohn's disease in her mother; Diabetes in her father and paternal grandmother; Heart disease in her mother; Heart failure in her brother and sister; Hypertension in her mother; Kidney cancer in her brother; Kidney disease in her mother and sister; Other in her sister; Stroke in her paternal grandmother; Ulcerative colitis in her mother.  ROS:   Please see the history of present illness.     All other systems reviewed and are negative.  EKGs/Labs/Other Studies Reviewed:    The following studies were reviewed today:  CT scan of chest lung cancer screening on 02/03/2020:  1. Lung-RADS 2, benign appearance or behavior. Continue annual screening with low-dose chest CT without contrast in 12 months. 2. Coronary artery calcifications noted. 3. Hepatic steatosis.  Personally reviewed and interpreted CT scan showing coronary artery calcification.  Nuclear stress test 2019-low risk no ischemia.  Recent Labs: 06/21/2021: ALT 18; TSH 1.70 10/30/2021: BUN 20; Creatinine, Ser 1.05; Hemoglobin 15.9; Magnesium 1.9; Platelets 244.0; Potassium 4.4; Sodium 137  Recent Lipid Panel    Component Value Date/Time   CHOL 109 06/21/2021 1044   TRIG 77.0 06/21/2021 1044   HDL 48.70 06/21/2021 1044   CHOLHDL 2 06/21/2021 1044   VLDL 15.4 06/21/2021 1044   LDLCALC 45  06/21/2021 1044     Risk Assessment/Calculations:      Physical Exam:    VS:  BP 130/80 (BP Location: Left Arm, Patient Position: Sitting, Cuff Size: Normal)   Pulse (!) 53   Ht 5' (1.524 m)   Wt 227 lb (103 kg)   LMP 05/14/1988   SpO2 98%   BMI 44.33 kg/m     Wt Readings from Last 3 Encounters:  01/30/22 227 lb (103 kg)  01/17/22 225 lb (102.1 kg)  10/30/21 220 lb (99.8 kg)     GEN: Well nourished, well developed, in no acute distress HEENT: normal Neck: no JVD, carotid bruits, or masses Cardiac: RRR; no murmurs, rubs, or gallops,no edema  Respiratory:  clear to auscultation bilaterally, normal work of breathing GI: soft, nontender, nondistended, + BS MS: no deformity or atrophy Skin: warm and dry, no rash Neuro:  Alert and Oriented x 3, Strength and sensation are intact Psych: euthymic mood, full affect   ASSESSMENT:    No diagnosis found.  PLAN:    In order of problems listed above:  Hyperlipidemia in the setting of coronary artery calcification/coronary atherosclerosis seen on CT. - Appreciate lipid clinic assistance, extensive conversations regarding patient's cardiovascular risk.  She ended up developing extensive coronary calcifications despite baseline LDL in the 40s and 50s.  We would like to reach target LDL of less than 40.  She started Zetia 10 mg a day on 09/22/2020.  An advanced lipid panel was ordered for August looking at LDL particle #8 will be as well as LP(a).  Continue with medical management.  Refills as needed.  Current LDL 45 excellent on Zetia monotherapy. - Had some excellent questions about lipids/coronary artery disease.  Essential hypertension - Currently on amlodipine 10 mg,  Bystolic 10 mg daily and spironolactone 100 mg a day.  Prescribed via Dr. Ronnald Ramp.  Continue with current medical management.  She is concerned that she is on  too many medications.  Her blood pressure is excellent today.  Continue encourage diet and exercise.  Crohn's  disease - Per GI.  Tobacco use - Continue to encourage tobacco counseling.  Spoke with the importance of this.  This is a major risk factor for her especially given her coronary plaque that is present.  This increases her risk of heart attack.  OSA --Pulse 50.  Noted by sleep team.  I am comfortable with this.  She is asymptomatic with it.  Bystolic is usually the contributing factor.  Beta-blocker.  I do not think that she needs a change in medication since her blood pressure is under good control currently.  1 yr f/u   Medication Adjustments/Labs and Tests Ordered: Current medicines are reviewed at length with the patient today.  Concerns regarding medicines are outlined above.  No orders of the defined types were placed in this encounter.  No orders of the defined types were placed in this encounter.   Patient Instructions  Medication Instructions:  The current medical regimen is effective;  continue present plan and medications.  *If you need a refill on your cardiac medications before your next appointment, please call your pharmacy*  Follow-Up: At [facility], you and your health needs are our priority.  As part of our continuing mission to provide you with exceptional heart care, we have created designated Provider Care Teams.  These Care Teams include your primary Cardiologist (physician) and Advanced Practice Providers (APPs -  Physician Assistants and Nurse Practitioners) who all work together to provide you with the care you need, when you need it.  We recommend signing up for the patient portal called "MyChart".  Sign up information is provided on this After Visit Summary.  MyChart is used to connect with patients for Virtual Visits (Telemedicine).  Patients are able to view lab/test results, encounter notes, upcoming appointments, etc.  Non-urgent messages can be sent to your provider as well.   To learn more about what you can do with MyChart, go to  [email or web address].    Your next appointment:   1 year(s)  The format for your next appointment:   In Person  Provider:   Candee Furbish, MD      Important Information About Sugar         Signed, Candee Furbish, MD  01/30/2022 10:14 AM    [place]

## 2022-02-05 DIAGNOSIS — R7309 Other abnormal glucose: Secondary | ICD-10-CM | POA: Diagnosis not present

## 2022-02-05 DIAGNOSIS — K5090 Crohn's disease, unspecified, without complications: Secondary | ICD-10-CM | POA: Diagnosis not present

## 2022-02-05 DIAGNOSIS — L680 Hirsutism: Secondary | ICD-10-CM | POA: Diagnosis not present

## 2022-02-05 DIAGNOSIS — I10 Essential (primary) hypertension: Secondary | ICD-10-CM | POA: Diagnosis not present

## 2022-02-06 DIAGNOSIS — R7309 Other abnormal glucose: Secondary | ICD-10-CM | POA: Diagnosis not present

## 2022-02-06 DIAGNOSIS — L680 Hirsutism: Secondary | ICD-10-CM | POA: Diagnosis not present

## 2022-02-08 ENCOUNTER — Ambulatory Visit: Payer: Medicare Other

## 2022-02-09 ENCOUNTER — Ambulatory Visit (INDEPENDENT_AMBULATORY_CARE_PROVIDER_SITE_OTHER): Payer: Medicare Other

## 2022-02-09 DIAGNOSIS — E538 Deficiency of other specified B group vitamins: Secondary | ICD-10-CM | POA: Diagnosis not present

## 2022-02-09 DIAGNOSIS — Z23 Encounter for immunization: Secondary | ICD-10-CM

## 2022-02-09 MED ORDER — CYANOCOBALAMIN 1000 MCG/ML IJ SOLN
1000.0000 ug | Freq: Once | INTRAMUSCULAR | Status: AC
  Administered 2022-02-09: 1000 ug via INTRAMUSCULAR

## 2022-02-09 NOTE — Progress Notes (Signed)
B12 given.  Pt tolerated well. Pt is aware to give the office a call for an side effects or reactions. Please co-sign.

## 2022-02-11 DIAGNOSIS — I739 Peripheral vascular disease, unspecified: Secondary | ICD-10-CM

## 2022-02-11 HISTORY — DX: Peripheral vascular disease, unspecified: I73.9

## 2022-02-12 ENCOUNTER — Other Ambulatory Visit: Payer: Self-pay | Admitting: Cardiology

## 2022-02-13 ENCOUNTER — Encounter: Payer: Self-pay | Admitting: Internal Medicine

## 2022-02-15 ENCOUNTER — Other Ambulatory Visit: Payer: Self-pay | Admitting: Internal Medicine

## 2022-02-15 DIAGNOSIS — I10 Essential (primary) hypertension: Secondary | ICD-10-CM

## 2022-02-16 ENCOUNTER — Encounter: Payer: Self-pay | Admitting: Internal Medicine

## 2022-02-16 ENCOUNTER — Other Ambulatory Visit: Payer: Self-pay | Admitting: Internal Medicine

## 2022-02-19 ENCOUNTER — Telehealth: Payer: Self-pay | Admitting: *Deleted

## 2022-02-19 NOTE — Telephone Encounter (Signed)
Pt scheduled to see Dr. Quay Burow on 02/22/2022

## 2022-02-19 NOTE — Telephone Encounter (Signed)
Anderson Malta Given, did a house call over the weekend and done a QuantaFlo test on the patient and the patient left leg came back being " severe" for peripheral vascular disease.   Please advise

## 2022-02-21 NOTE — Progress Notes (Signed)
Subjective:    Patient ID: April May, female    DOB: 02/16/1956, 66 y.o.   MRN: [id number]      HPI April May is here for  Chief Complaint  Patient presents with   Follow-up    Patient had Quantaflo test done and here to follow up on blood flow     She had a home nurse visit and she was screening for PAD - QuantaFlor test showed severe right PAD and moderate in left leg.   Patient states at that time it was cold in her house and her feet were cold.  When she is walking around she gets an achy feeling in her right lower leg. She can not walk any further when this happens and has to stop.  That started about 2 months ago.  Resting helps and relieves the pain.  No symptoms in left leg.     Active smoker - has been smoking for years - almost a pack per day.     2 weeks lump on right buttock region, no pain.  Getting smaller.  No discharge.  No fever.  It is not tender.   Lab Results  Component Value Date   LDLCALC 45 06/21/2021   Lab Results  Component Value Date   HGBA1C 6.0 10/30/2021      Medications and allergies reviewed with patient and updated if appropriate.  Current Outpatient Medications on File Prior to Visit  Medication Sig Dispense Refill   albuterol (PROAIR HFA) 108 (90 Base) MCG/ACT inhaler TAKE 2 PUFFS BY MOUTH EVERY 6 HOURS AS NEEDED FOR WHEEZE OR SHORTNESS OF BREATH 18 g 5   amLODipine (NORVASC) 10 MG tablet TAKE 1 TABLET BY MOUTH DAILY 100 tablet 0   budesonide (ENTOCORT EC) 3 MG 24 hr capsule Take 3 capsules (9 mg total) by mouth daily. 270 capsule 3   buPROPion (WELLBUTRIN XL) 150 MG 24 hr tablet Take 1 tablet (150 mg total) by mouth daily. 90 tablet 1   Cholecalciferol (VITAMIN D3) 1.25 MG (50000 UT) CAPS Take 1 capsule by mouth once a week. 12 capsule 0   clonazePAM (KLONOPIN) 0.5 MG tablet Take 1 tablet (0.5 mg total) by mouth 2 (two) times daily as needed for anxiety. 60 tablet 3   [facility] PAIN RELIEF EXTRA STRENGTH 500 MG tablet TAKE  1 TABLET BY MOUTH EVERY 8 HOURS AS NEEDED FOR MODERATE PAIN 60 tablet 5   cyanocobalamin 2000 MCG tablet Take 1 tablet (2,000 mcg total) by mouth daily. 90 tablet 1   ezetimibe (ZETIA) 10 MG tablet TAKE 1 TABLET BY MOUTH DAILY 60 tablet 5   fluticasone (FLONASE) 50 MCG/ACT nasal spray Place 2 sprays into both nostrils daily. 48 g 3   Insulin Pen Needle 32G X 6 MM MISC 1 Act by Does not apply route once a week. 30 each 1   lamoTRIgine (LAMICTAL) 200 MG tablet TAKE 1 TABLET BY MOUTH AT  BEDTIME 100 tablet 1   nebivolol (BYSTOLIC) 10 MG tablet Take 1 tablet (10 mg total) by mouth daily. 90 tablet 1   Potassium Citrate 15 MEQ (1620 MG) TBCR TAKE 1 TABLET BY MOUTH IN THE  MORNING , AT NOON, AND AT  BEDTIME 200 tablet 1   spironolactone (ALDACTONE) 100 MG tablet TAKE 1 TABLET BY MOUTH DAILY 100 tablet 1   STIOLTO RESPIMAT 2.5-2.5 MCG/ACT AERS USE 2 INHALATIONS BY MOUTH  DAILY 12 g 1   No current facility-administered medications on file prior to  visit.    Review of Systems  Constitutional:  Negative for fever.  Cardiovascular:  Negative for chest pain, palpitations and leg swelling.  Musculoskeletal:        Right leg cramping with activity  Skin:  Negative for color change.  Neurological:  Negative for numbness.       Objective:   Vitals:   02/22/22 1100  BP: 128/74  Pulse: 67  Temp: 98 F (36.7 C)  SpO2: 97%   BP Readings from Last 3 Encounters:  02/22/22 128/74  01/30/22 130/80  01/17/22 (!) 142/73   Wt Readings from Last 3 Encounters:  02/22/22 221 lb (100.2 kg)  01/30/22 227 lb (103 kg)  01/17/22 225 lb (102.1 kg)   Body mass index is 43.16 kg/m.    Physical Exam Constitutional:      General: She is not in acute distress.    Appearance: Normal appearance. She is not ill-appearing.  HENT:     Head: Normocephalic and atraumatic.  Cardiovascular:     Comments: Right DP pulse 2+, left DP pulse 1+.  Both feet were warm and normal color Skin:    General: Skin is warm  and dry.     Findings: No erythema, lesion or rash.     Comments: Right medial buttock near anus there is a soft skin lump that seems to have a tail going towards the anus-?  Fistula.  No tenderness.  No break in skin  Neurological:     Mental Status: She is alert.     Sensory: No sensory deficit (Bilateral feet with normal sensation).            Assessment & Plan:    Claudication of right lower extremity: Started 2 months ago Had recent home nurse visit and there test showed possible severe PAD on the right and moderate PAD on the left When the nurse came her house was cold and her feet were cold which may have influenced the results On exam today feet are warm, +2 DP pulse on right, +1 DP pulse on left Does have what sounds like claudication of the right lower extremity-?  PAD versus related to lumbar spine Stressed smoking cessation-discussed in detail about options that things that may help Referral to vascular surgery ordered   Right buttock skin lump and?  Perianal fistula: Palpable skin lump medial right buttock region that seems to have a tail going towards the anal area-nontender and getting smaller per patient Does not seem to be a typical boil or cyst ?  Fistula given history of Crohn's Advised seeing GI   Tobacco dependence due to cigarettes: Smoking cessation was discussed for 3 minutes, 30 seconds.  The patient was counseled on the dangers of tobacco use, in particular related to the possibility of peripheral arterial disease which is why she is here today.  She was advised to quit and she knows that she should quit.  She did quit once in the past cold [place]. , Reviewed ways of quitting smoking including nicotine replacement, vapping/e-cigarettes (which I do not recommend), cold [place], weaning off cigarettes/changing habits, and pharmacotherapy (wellbutrin and chantix).  She is currently on Wellbutrin-discussed that we could increase this, but not sure if it would  help.  Discussed Chantix and possible side effects-she is not comfortable with the side effects and is not willing to try that.  She does want to quit, but is not 100% committed to quitting.  Stressed that this is something that she needs to  work on and consequences of continued smoking   I spent 20 minutes dedicated to the care of this patient on the date of this encounter including review of recent labs,  procedures, obtaining history, communicating with the patient, ordering refills, referral, and documenting clinical information in the EHR

## 2022-02-22 ENCOUNTER — Other Ambulatory Visit: Payer: Self-pay | Admitting: Internal Medicine

## 2022-02-22 ENCOUNTER — Telehealth: Payer: Self-pay | Admitting: Gastroenterology

## 2022-02-22 ENCOUNTER — Encounter: Payer: Self-pay | Admitting: Internal Medicine

## 2022-02-22 ENCOUNTER — Ambulatory Visit (INDEPENDENT_AMBULATORY_CARE_PROVIDER_SITE_OTHER): Payer: Medicare Other | Admitting: Internal Medicine

## 2022-02-22 VITALS — BP 128/74 | HR 67 | Temp 98.0°F | Ht 60.0 in | Wt 221.0 lb

## 2022-02-22 DIAGNOSIS — R229 Localized swelling, mass and lump, unspecified: Secondary | ICD-10-CM | POA: Diagnosis not present

## 2022-02-22 DIAGNOSIS — F17210 Nicotine dependence, cigarettes, uncomplicated: Secondary | ICD-10-CM | POA: Diagnosis not present

## 2022-02-22 DIAGNOSIS — F321 Major depressive disorder, single episode, moderate: Secondary | ICD-10-CM

## 2022-02-22 DIAGNOSIS — I739 Peripheral vascular disease, unspecified: Secondary | ICD-10-CM | POA: Diagnosis not present

## 2022-02-22 DIAGNOSIS — R7303 Prediabetes: Secondary | ICD-10-CM

## 2022-02-22 MED ORDER — LAMOTRIGINE 200 MG PO TABS: 200.0000 mg | ORAL_TABLET | Freq: Every day | ORAL | 1 refills | Status: DC

## 2022-02-22 NOTE — Patient Instructions (Signed)
     Work on quitting smoking   Medications changes include :   none   Your prescription(s) have been sent to your pharmacy.    A referral was ordered for [facility] vascular surgery.     Someone from that office will call you to schedule an appointment.

## 2022-02-23 DIAGNOSIS — I1A0 Resistant hypertension: Secondary | ICD-10-CM | POA: Diagnosis not present

## 2022-02-23 DIAGNOSIS — N181 Chronic kidney disease, stage 1: Secondary | ICD-10-CM | POA: Diagnosis not present

## 2022-02-23 DIAGNOSIS — E1022 Type 1 diabetes mellitus with diabetic chronic kidney disease: Secondary | ICD-10-CM | POA: Diagnosis not present

## 2022-02-23 DIAGNOSIS — E876 Hypokalemia: Secondary | ICD-10-CM | POA: Diagnosis not present

## 2022-02-23 DIAGNOSIS — K5090 Crohn's disease, unspecified, without complications: Secondary | ICD-10-CM | POA: Diagnosis not present

## 2022-02-24 DIAGNOSIS — G4733 Obstructive sleep apnea (adult) (pediatric): Secondary | ICD-10-CM | POA: Diagnosis not present

## 2022-02-26 ENCOUNTER — Telehealth: Payer: Self-pay | Admitting: Gastroenterology

## 2022-02-26 NOTE — Telephone Encounter (Signed)
MEDICATION: budesonide (ENTOCORT EC) 3 MG 24 hr capsule  PHARMACY: [facility] ([place], [place] [address] [place]  Comments: Patient asked if Dr.Jones would take over prescribing this. Only has two days left   **Let patient know to contact pharmacy at the end of the day to make sure medication is ready. **  ** Please notify patient to allow 48-72 hours to process**  **Encourage patient to contact the pharmacy for refills or they can request refills through [facility]**

## 2022-02-26 NOTE — Telephone Encounter (Signed)
Inbound call from patient in regards to budesonide medication. Patient states the pharmacy has denied a refill. Please give patient a call to further advise.  Thank you

## 2022-02-27 MED ORDER — BUDESONIDE 3 MG PO CPEP: 9.0000 mg | ORAL_CAPSULE | Freq: Every day | ORAL | 1 refills | Status: DC

## 2022-02-27 NOTE — Telephone Encounter (Signed)
Patient states she needs a [facility]. Patient states she scheduled appt for December. Patient reports she was told by her PCP to make an appt for a bump she has on her rectum. Patient states its not very painful but is uncomfortable when she sits down. Offered patient sooner appt with an APP but patient declined and will call back if her symptoms get worse.

## 2022-02-28 ENCOUNTER — Other Ambulatory Visit: Payer: Self-pay | Admitting: Internal Medicine

## 2022-02-28 ENCOUNTER — Encounter: Payer: Self-pay | Admitting: Internal Medicine

## 2022-03-01 ENCOUNTER — Other Ambulatory Visit: Payer: Self-pay | Admitting: *Deleted

## 2022-03-01 DIAGNOSIS — M79606 Pain in leg, unspecified: Secondary | ICD-10-CM

## 2022-03-01 MED ORDER — AMOXICILLIN-POT CLAVULANATE 875-125 MG PO TABS: 1.0000 | ORAL_TABLET | Freq: Two times a day (BID) | ORAL | 0 refills | Status: DC

## 2022-03-02 ENCOUNTER — Ambulatory Visit: Payer: Medicare Other

## 2022-03-02 ENCOUNTER — Ambulatory Visit
Admission: RE | Admit: 2022-03-02 | Discharge: 2022-03-02 | Disposition: A | Discharge status: Home / Self Care | Payer: Medicare Other | Source: Ambulatory Visit | Attending: Internal Medicine | Admitting: Internal Medicine

## 2022-03-02 DIAGNOSIS — Z1231 Encounter for screening mammogram for malignant neoplasm of breast: Secondary | ICD-10-CM

## 2022-03-03 ENCOUNTER — Other Ambulatory Visit: Payer: Self-pay | Admitting: Internal Medicine

## 2022-03-03 DIAGNOSIS — F321 Major depressive disorder, single episode, moderate: Secondary | ICD-10-CM

## 2022-03-03 DIAGNOSIS — I10 Essential (primary) hypertension: Secondary | ICD-10-CM

## 2022-03-05 ENCOUNTER — Other Ambulatory Visit: Payer: Self-pay | Admitting: *Deleted

## 2022-03-05 DIAGNOSIS — M79606 Pain in leg, unspecified: Secondary | ICD-10-CM

## 2022-03-10 ENCOUNTER — Other Ambulatory Visit: Payer: Self-pay | Admitting: Internal Medicine

## 2022-03-10 DIAGNOSIS — R7303 Prediabetes: Secondary | ICD-10-CM

## 2022-03-12 ENCOUNTER — Ambulatory Visit: Payer: Medicare Other | Admitting: Surgery

## 2022-03-12 ENCOUNTER — Encounter: Payer: Self-pay | Admitting: Surgery

## 2022-03-12 ENCOUNTER — Ambulatory Visit (HOSPITAL_COMMUNITY)
Admission: RE | Admit: 2022-03-12 | Discharge: 2022-03-12 | Disposition: A | Discharge status: Home / Self Care | Payer: Medicare Other | Source: Ambulatory Visit | Attending: Surgery | Admitting: Surgery

## 2022-03-12 VITALS — BP 149/78 | HR 54 | Temp 97.9°F | Resp 20 | Ht 60.0 in | Wt 219.0 lb

## 2022-03-12 DIAGNOSIS — I70213 Atherosclerosis of native arteries of extremities with intermittent claudication, bilateral legs: Secondary | ICD-10-CM | POA: Diagnosis not present

## 2022-03-12 DIAGNOSIS — M79606 Pain in leg, unspecified: Secondary | ICD-10-CM | POA: Insufficient documentation

## 2022-03-12 MED ORDER — METHYLPREDNISOLONE 4 MG PO TBPK: ORAL_TABLET | ORAL | Status: DC

## 2022-03-12 NOTE — H&P (View-Only) (Signed)
[facility] [place]  Patient name: April May MRN: [id number] DOB: [birth year] Sex: female   REQUESTING PROVIDER:    Dr. Quay Burow   REASON FOR CONSULT:    PAD  HISTORY OF PRESENT ILLNESS:   April May is a 66 y.o. female, who is referred for evaluation of peripheral vascular disease.  She has been having problems with right toe pain for several weeks.  She has had this injected by podiatry.  She continues to complain of constant pain that goes from her heel to her big toe.  She has noticed that it has become more red.  She takes Tylenol which does alleviate some of the problems.  Her symptoms are aggravated by walking.  Patient suffers from hypercholesterolemia.  She is intolerant of statins and is on Zetia.  She has type 2 diabetes.  She is medically managed for hypertension.  She is treated for Crohn's disease as well as hypertension.  She is a current smoker.  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   Vitamin D deficiency      FAMILY HISTORY   Family History  Problem Relation Age of Onset   Hypertension Mother    Ulcerative colitis Mother    Crohn's disease Mother    Heart disease Mother    Kidney disease Mother    Arthritis Father    Brain cancer Father    Diabetes Father    Alcohol abuse Paternal Uncle    Diabetes Paternal Grandmother    Stroke Paternal Grandmother    Heart failure Sister    Kidney disease Sister    Heart failure Brother    Other Sister         prediabetes   Kidney cancer Brother     SOCIAL HISTORY:   Social History   Socioeconomic History   Marital status: Divorced    Spouse name: Not on file   Number of children: 2   Years of education: Not on file   Highest education level: 12th grade  Occupational History   Occupation: disabled  Tobacco Use   Smoking status: Every Day    Packs/day: 1.00    Years: 35.00    Total pack years: 35.00    Types: Cigarettes   Smokeless tobacco: Never  Vaping Use   Vaping Use: Never used  Substance and Sexual Activity   Alcohol use: No   Drug use: No   Sexual activity: Not Currently  Other Topics Concern   Not on file  Social History Narrative   Not on file   Social Determinants of Health   Financial Resource Strain: Low Risk  (11/24/2021)   Overall Financial Resource Strain (CARDIA)    Difficulty of Paying Living Expenses: Not hard at all  Food Insecurity: No Food Insecurity (11/24/2021)   Hunger Vital Sign    Worried About Running Out of Food in the Last Year: Never true  Ran Out of Food in the Last Year: Never true  Transportation Needs: No Transportation Needs (11/24/2021)   PRAPARE - Hydrologist (Medical): No    Lack of Transportation (Non-Medical): No  Physical Activity: Sufficiently Active (11/24/2021)   Exercise Vital Sign    Days of Exercise per Week: 5 days    Minutes of Exercise per Session: 30 min  Stress: No Stress Concern Present (11/24/2021)   [place]    Feeling of Stress : Not at all  Social Connections: Moderately Integrated (11/24/2021)   Social Connection and Isolation Panel [NHANES]    Frequency of Communication with Friends and Family: More than three times a week    Frequency of Social Gatherings with Friends and Family: Once a week    Attends Religious Services: More than 4 times per year    Active Member of [employer] or Organizations: No    Attends Programme researcher, broadcasting/film/video: More than 4 times per year    Marital Status: Divorced  Human resources officer Violence: Not At Risk (11/22/2020)   Humiliation, Afraid, Rape, and Kick questionnaire    Fear of Current or Ex-Partner: No    Emotionally Abused: No    Physically Abused: No    Sexually Abused: No    ALLERGIES:    Allergies  Allergen Reactions   Azathioprine Other (See Comments)    Side effects to pancrease      Crestor [Rosuvastatin Calcium] Other (See Comments)    Muscle and joint aches   Lipitor [Atorvastatin Calcium] Other (See Comments)    Muscle cramps   Semaglutide Other (See Comments)    Constipation    Lisinopril Cough    CURRENT MEDICATIONS:    Current Outpatient Medications  Medication Sig Dispense Refill   albuterol (PROAIR HFA) 108 (90 Base) MCG/ACT inhaler TAKE 2 PUFFS BY MOUTH EVERY 6 HOURS AS NEEDED FOR WHEEZE OR SHORTNESS OF BREATH 18 g 5   amLODipine (NORVASC) 10 MG tablet TAKE 1 TABLET BY MOUTH DAILY 100 tablet 0   amoxicillin-clavulanate (AUGMENTIN) 875-125 MG tablet Take 1 tablet by mouth 2 (two) times daily. 20 tablet 0   budesonide (ENTOCORT EC) 3 MG 24 hr capsule Take 3 capsules (9 mg total) by mouth daily. 90 capsule 1   buPROPion (WELLBUTRIN XL) 150 MG 24 hr tablet TAKE 1 TABLET BY MOUTH DAILY 100 tablet 0   Cholecalciferol (VITAMIN D3) 1.25 MG (50000 UT) CAPS Take 1 capsule by mouth once a week. 12 capsule 0   clonazePAM (KLONOPIN) 0.5 MG tablet Take 1 tablet (0.5 mg total) by mouth 2 (two) times daily as needed for anxiety. 60 tablet 3   [facility] PAIN RELIEF EXTRA STRENGTH 500 MG tablet TAKE 1 TABLET BY MOUTH EVERY 8 HOURS AS NEEDED FOR MODERATE PAIN 60 tablet 5   cyanocobalamin 2000 MCG tablet Take 1 tablet (2,000 mcg total) by mouth daily. 90 tablet 1   ezetimibe (ZETIA) 10 MG tablet TAKE 1 TABLET BY MOUTH DAILY 60 tablet 5   fluticasone (FLONASE) 50 MCG/ACT nasal spray Place 2 sprays into both nostrils daily. 48 g 3   Insulin Pen Needle 32G X 6 MM MISC 1  Act by Does not apply route once a week. 30 each 1   lamoTRIgine (LAMICTAL) 200 MG tablet Take 1 tablet (200 mg total) by mouth at bedtime. 100 tablet 1   nebivolol (BYSTOLIC) 10 MG tablet TAKE 1 TABLET BY MOUTH DAILY  100 tablet 0   Potassium Citrate 15 MEQ (1620 MG) TBCR TAKE 1 TABLET BY MOUTH IN THE  MORNING , AT NOON, AND AT  BEDTIME 200 tablet 1   spironolactone (ALDACTONE) 100 MG tablet TAKE 1 TABLET BY MOUTH DAILY 100 tablet 1   STIOLTO RESPIMAT 2.5-2.5 MCG/ACT AERS USE 2 INHALATIONS BY MOUTH  DAILY 12 g 1   No current facility-administered medications for this visit.    REVIEW OF SYSTEMS:   [X]  denotes positive finding, [ ]  denotes negative finding Cardiac  Comments:  Chest pain or chest pressure:    Shortness of breath upon exertion:    Short of breath when lying flat:    Irregular heart rhythm:        Vascular    Pain in calf, thigh, or hip brought on by ambulation: x   Pain in feet at night that wakes you up from your sleep:  x   Blood clot in your veins:    Leg swelling:         Pulmonary    Oxygen at home:    Productive cough:     Wheezing:         Neurologic    Sudden weakness in arms or legs:     Sudden numbness in arms or legs:     Sudden onset of difficulty speaking or slurred speech:    Temporary loss of vision in one eye:     Problems with dizziness:         Gastrointestinal    Blood in stool:      Vomited blood:         Genitourinary    Burning when urinating:     Blood in urine:        Psychiatric    Major depression:         Hematologic    Bleeding problems:    Problems with blood clotting too easily:        Skin    Rashes or ulcers:        Constitutional    Fever or chills:     PHYSICAL EXAM:   Vitals:   03/12/22 1046  BP: (!) 149/78  Pulse: (!) 54  Resp: 20  Temp: 97.9 F (36.6 C)  SpO2: 93%  Weight: 219 lb (99.3 kg)  Height: 5' (1.524 m)    GENERAL: The patient is a well-nourished female, in no acute distress. The vital  signs are documented above. CARDIAC: There is a regular rate and rhythm.  VASCULAR: I could not palpate pedal pulses PULMONARY: Nonlabored respirations ABDOMEN: Soft and non-tender  MUSCULOSKELETAL: There are no major deformities or cyanosis. NEUROLOGIC: No focal weakness or paresthesias are detected. SKIN: There are no ulcers or rashes noted. PSYCHIATRIC: The patient has a normal affect.  STUDIES:   I have reviewed the following: +-------+-----------+-----------+------------+------------+  ABI/TBIToday's ABIToday's TBIPrevious ABIPrevious TBI  +-------+-----------+-----------+------------+------------+  Right  0.64       0.00                                 +-------+-----------+-----------+------------+------------+  Left   1.19       0.61                                 +-------+-----------+-----------+------------+------------+  Right toe pressure: 0 Left toe pressure: 92 Right leg  waveforms are monophasic.  Left are biphasic  ASSESSMENT and PLAN   Right great toe pain: I discussed with the patient that this could potentially be related to gout.  She did get some relief from a injection to her foot which I presume was steroids.  I offered to start her on a steroid Dosepak, however she does not want to take anymore medications.  I am also concerned that this could be related to arterial insufficiency as her right great toe pressure is 0 and her ABIs are 0.64 on the right.  Therefore I think that she needs to undergo angiography via a left femoral approach to evaluate blood flow to her right leg and intervene if necessary.  We will arrange to get this on November 7 some benefits were discussed with the patient and she wishes to proceed.  I stressed the importance of smoking cessation   [name], IV, MD, FACS [facility] [phone] Pager [phone]

## 2022-03-12 NOTE — Progress Notes (Signed)
[facility] [place]  Patient name: April May MRN: [id number] DOB: 06/02/1955 Sex: female   REQUESTING PROVIDER:    Dr. Quay Burow   REASON FOR CONSULT:    PAD  HISTORY OF PRESENT ILLNESS:   April May is a 66 y.o. female, who is referred for evaluation of peripheral vascular disease.  She has been having problems with right toe pain for several weeks.  She has had this injected by podiatry.  She continues to complain of constant pain that goes from her heel to her big toe.  She has noticed that it has become more red.  She takes Tylenol which does alleviate some of the problems.  Her symptoms are aggravated by walking.  Patient suffers from hypercholesterolemia.  She is intolerant of statins and is on Zetia.  She has type 2 diabetes.  She is medically managed for hypertension.  She is treated for Crohn's disease as well as hypertension.  She is a current smoker.  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   Vitamin D deficiency      FAMILY HISTORY   Family History  Problem Relation Age of Onset   Hypertension Mother    Ulcerative colitis Mother    Crohn's disease Mother    Heart disease Mother    Kidney disease Mother    Arthritis Father    Brain cancer Father    Diabetes Father    Alcohol abuse Paternal Uncle    Diabetes Paternal Grandmother    Stroke Paternal Grandmother    Heart failure Sister    Kidney disease Sister    Heart failure Brother    Other Sister         prediabetes   Kidney cancer Brother     SOCIAL HISTORY:   Social History   Socioeconomic History   Marital status: Divorced    Spouse name: Not on file   Number of children: 2   Years of education: Not on file   Highest education level: 12th grade  Occupational History   Occupation: disabled  Tobacco Use   Smoking status: Every Day    Packs/day: 1.00    Years: 35.00    Total pack years: 35.00    Types: Cigarettes   Smokeless tobacco: Never  Vaping Use   Vaping Use: Never used  Substance and Sexual Activity   Alcohol use: No   Drug use: No   Sexual activity: Not Currently  Other Topics Concern   Not on file  Social History Narrative   Not on file   Social Determinants of Health   Financial Resource Strain: Low Risk  (11/24/2021)   Overall Financial Resource Strain (CARDIA)    Difficulty of Paying Living Expenses: Not hard at all  Food Insecurity: No Food Insecurity (11/24/2021)   Hunger Vital Sign    Worried About Running Out of Food in the Last Year: Never true  Ran Out of Food in the Last Year: Never true  Transportation Needs: No Transportation Needs (11/24/2021)   PRAPARE - Hydrologist (Medical): No    Lack of Transportation (Non-Medical): No  Physical Activity: Sufficiently Active (11/24/2021)   Exercise Vital Sign    Days of Exercise per Week: 5 days    Minutes of Exercise per Session: 30 min  Stress: No Stress Concern Present (11/24/2021)   [place]    Feeling of Stress : Not at all  Social Connections: Moderately Integrated (11/24/2021)   Social Connection and Isolation Panel [NHANES]    Frequency of Communication with Friends and Family: More than three times a week    Frequency of Social Gatherings with Friends and Family: Once a week    Attends Religious Services: More than 4 times per year    Active Member of [employer] or Organizations: No    Attends Programme researcher, broadcasting/film/video: More than 4 times per year    Marital Status: Divorced  Human resources officer Violence: Not At Risk (11/22/2020)   Humiliation, Afraid, Rape, and Kick questionnaire    Fear of Current or Ex-Partner: No    Emotionally Abused: No    Physically Abused: No    Sexually Abused: No    ALLERGIES:    Allergies  Allergen Reactions   Azathioprine Other (See Comments)    Side effects to pancrease      Crestor [Rosuvastatin Calcium] Other (See Comments)    Muscle and joint aches   Lipitor [Atorvastatin Calcium] Other (See Comments)    Muscle cramps   Semaglutide Other (See Comments)    Constipation    Lisinopril Cough    CURRENT MEDICATIONS:    Current Outpatient Medications  Medication Sig Dispense Refill   albuterol (PROAIR HFA) 108 (90 Base) MCG/ACT inhaler TAKE 2 PUFFS BY MOUTH EVERY 6 HOURS AS NEEDED FOR WHEEZE OR SHORTNESS OF BREATH 18 g 5   amLODipine (NORVASC) 10 MG tablet TAKE 1 TABLET BY MOUTH DAILY 100 tablet 0   amoxicillin-clavulanate (AUGMENTIN) 875-125 MG tablet Take 1 tablet by mouth 2 (two) times daily. 20 tablet 0   budesonide (ENTOCORT EC) 3 MG 24 hr capsule Take 3 capsules (9 mg total) by mouth daily. 90 capsule 1   buPROPion (WELLBUTRIN XL) 150 MG 24 hr tablet TAKE 1 TABLET BY MOUTH DAILY 100 tablet 0   Cholecalciferol (VITAMIN D3) 1.25 MG (50000 UT) CAPS Take 1 capsule by mouth once a week. 12 capsule 0   clonazePAM (KLONOPIN) 0.5 MG tablet Take 1 tablet (0.5 mg total) by mouth 2 (two) times daily as needed for anxiety. 60 tablet 3   [employer] PAIN RELIEF EXTRA STRENGTH 500 MG tablet TAKE 1 TABLET BY MOUTH EVERY 8 HOURS AS NEEDED FOR MODERATE PAIN 60 tablet 5   cyanocobalamin 2000 MCG tablet Take 1 tablet (2,000 mcg total) by mouth daily. 90 tablet 1   ezetimibe (ZETIA) 10 MG tablet TAKE 1 TABLET BY MOUTH DAILY 60 tablet 5   fluticasone (FLONASE) 50 MCG/ACT nasal spray Place 2 sprays into both nostrils daily. 48 g 3   Insulin Pen Needle 32G X 6 MM MISC 1  Act by Does not apply route once a week. 30 each 1   lamoTRIgine (LAMICTAL) 200 MG tablet Take 1 tablet (200 mg total) by mouth at bedtime. 100 tablet 1   nebivolol (BYSTOLIC) 10 MG tablet TAKE 1 TABLET BY MOUTH DAILY  100 tablet 0   Potassium Citrate 15 MEQ (1620 MG) TBCR TAKE 1 TABLET BY MOUTH IN THE  MORNING , AT NOON, AND AT  BEDTIME 200 tablet 1   spironolactone (ALDACTONE) 100 MG tablet TAKE 1 TABLET BY MOUTH DAILY 100 tablet 1   STIOLTO RESPIMAT 2.5-2.5 MCG/ACT AERS USE 2 INHALATIONS BY MOUTH  DAILY 12 g 1   No current facility-administered medications for this visit.    REVIEW OF SYSTEMS:   [X]  denotes positive finding, [ ]  denotes negative finding Cardiac  Comments:  Chest pain or chest pressure:    Shortness of breath upon exertion:    Short of breath when lying flat:    Irregular heart rhythm:        Vascular    Pain in calf, thigh, or hip brought on by ambulation: x   Pain in feet at night that wakes you up from your sleep:  x   Blood clot in your veins:    Leg swelling:         Pulmonary    Oxygen at home:    Productive cough:     Wheezing:         Neurologic    Sudden weakness in arms or legs:     Sudden numbness in arms or legs:     Sudden onset of difficulty speaking or slurred speech:    Temporary loss of vision in one eye:     Problems with dizziness:         Gastrointestinal    Blood in stool:      Vomited blood:         Genitourinary    Burning when urinating:     Blood in urine:        Psychiatric    Major depression:         Hematologic    Bleeding problems:    Problems with blood clotting too easily:        Skin    Rashes or ulcers:        Constitutional    Fever or chills:     PHYSICAL EXAM:   Vitals:   03/12/22 1046  BP: (!) 149/78  Pulse: (!) 54  Resp: 20  Temp: 97.9 F (36.6 C)  SpO2: 93%  Weight: 219 lb (99.3 kg)  Height: 5' (1.524 m)    GENERAL: The patient is a well-nourished female, in no acute distress. The vital  signs are documented above. CARDIAC: There is a regular rate and rhythm.  VASCULAR: I could not palpate pedal pulses PULMONARY: Nonlabored respirations ABDOMEN: Soft and non-tender  MUSCULOSKELETAL: There are no major deformities or cyanosis. NEUROLOGIC: No focal weakness or paresthesias are detected. SKIN: There are no ulcers or rashes noted. PSYCHIATRIC: The patient has a normal affect.  STUDIES:   I have reviewed the following: +-------+-----------+-----------+------------+------------+  ABI/TBIToday's ABIToday's TBIPrevious ABIPrevious TBI  +-------+-----------+-----------+------------+------------+  Right  0.64       0.00                                 +-------+-----------+-----------+------------+------------+  Left   1.19       0.61                                 +-------+-----------+-----------+------------+------------+  Right toe pressure: 0 Left toe pressure: 92 Right leg  waveforms are monophasic.  Left are biphasic  ASSESSMENT and PLAN   Right great toe pain: I discussed with the patient that this could potentially be related to gout.  She did get some relief from a injection to her foot which I presume was steroids.  I offered to start her on a steroid Dosepak, however she does not want to take anymore medications.  I am also concerned that this could be related to arterial insufficiency as her right great toe pressure is 0 and her ABIs are 0.64 on the right.  Therefore I think that she needs to undergo angiography via a left femoral approach to evaluate blood flow to her right leg and intervene if necessary.  We will arrange to get this on November 7 some benefits were discussed with the patient and she wishes to proceed.  I stressed the importance of smoking cessation   [name], IV, MD, FACS [facility] [phone] Pager [phone]

## 2022-03-13 ENCOUNTER — Encounter: Payer: Self-pay | Admitting: Gastroenterology

## 2022-03-13 ENCOUNTER — Ambulatory Visit (INDEPENDENT_AMBULATORY_CARE_PROVIDER_SITE_OTHER): Payer: Medicare Other | Admitting: *Deleted

## 2022-03-13 DIAGNOSIS — E538 Deficiency of other specified B group vitamins: Secondary | ICD-10-CM | POA: Diagnosis not present

## 2022-03-13 MED ORDER — CYANOCOBALAMIN 1000 MCG/ML IJ SOLN
1000.0000 ug | Freq: Once | INTRAMUSCULAR | Status: AC
  Administered 2022-03-13: 1000 ug via INTRAMUSCULAR

## 2022-03-13 NOTE — Telephone Encounter (Signed)
Rescheduled OV with patient to 03/21/22 at 9:30 am with Dr. Fuller Plan.

## 2022-03-13 NOTE — Progress Notes (Signed)
Administered B12 1000 mcg/mL left deltoid. Pt tolerated well.

## 2022-03-14 ENCOUNTER — Other Ambulatory Visit: Payer: Self-pay

## 2022-03-14 ENCOUNTER — Ambulatory Visit: Payer: Medicare Other | Admitting: Podiatry

## 2022-03-14 DIAGNOSIS — B351 Tinea unguium: Secondary | ICD-10-CM

## 2022-03-14 DIAGNOSIS — L539 Erythematous condition, unspecified: Secondary | ICD-10-CM

## 2022-03-14 DIAGNOSIS — I70213 Atherosclerosis of native arteries of extremities with intermittent claudication, bilateral legs: Secondary | ICD-10-CM

## 2022-03-14 DIAGNOSIS — M79675 Pain in left toe(s): Secondary | ICD-10-CM | POA: Diagnosis not present

## 2022-03-14 DIAGNOSIS — M79606 Pain in leg, unspecified: Secondary | ICD-10-CM

## 2022-03-14 DIAGNOSIS — M79674 Pain in right toe(s): Secondary | ICD-10-CM

## 2022-03-14 MED ORDER — OXYCODONE-ACETAMINOPHEN 5-325 MG PO TABS: 1.0000 | ORAL_TABLET | ORAL | 0 refills | Status: DC | PRN

## 2022-03-14 NOTE — Progress Notes (Signed)
Subjective:  Patient ID: April May, female    DOB: [birth year],  MRN: [id number]  Chief Complaint  Patient presents with   Nail Problem    Nail trim     66 y.o. female presents with the above complaint.  Patient presents with thickened elongated dystrophic toenails x10 mild pain on palpation hurts with ambulation worse with pressure.  She has secondary complaint of redness to the top of her right foot.  She is being closely followed by vascular surgery for which she has procedure next week for stenting.  She denies any other acute complaints.  No clinical signs of infection no open wounds or lesion.   Review of Systems: Negative except as noted in the HPI. Denies N/V/F/Ch.  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 (Patient not taking: Reported on 03/19/2022), Disp: 20 tablet, Rfl: 0   aspirin EC 81 MG tablet, Take 1 tablet (81 mg total) by mouth daily. Swallow whole., Disp: 150 tablet, Rfl: 2   budesonide (ENTOCORT EC) 3 MG 24 hr  capsule, Take 3 capsules (9 mg total) by mouth daily., Disp: 90 capsule, Rfl: 1   buPROPion (WELLBUTRIN XL) 150 MG 24 hr tablet, TAKE 1 TABLET BY MOUTH DAILY, Disp: 100 tablet, Rfl: 0   Cholecalciferol (VITAMIN D3) 1.25 MG (50000 UT) CAPS, Take 1 capsule by mouth once a week., Disp: 12 capsule, Rfl: 0   clonazePAM (KLONOPIN) 0.5 MG tablet, Take 1 tablet (0.5 mg total) by mouth 2 (two) times daily as needed for anxiety., Disp: 60 tablet, Rfl: 3   clopidogrel (PLAVIX) 75 MG tablet, Take 1 tablet (75 mg total) by mouth daily., Disp: 30 tablet, Rfl: 11   [employer] PAIN RELIEF EXTRA STRENGTH 500 MG tablet, TAKE 1 TABLET BY MOUTH EVERY 8 HOURS AS NEEDED FOR MODERATE PAIN (Patient taking differently: Take 500-1,000 mg by mouth every 8 (eight) hours as needed for moderate pain.), Disp: 60 tablet, Rfl: 5   Cyanocobalamin (B-12 COMPLIANCE INJECTION IJ), Inject 1 Dose as directed every 30 (thirty) days., Disp: , Rfl:    cyanocobalamin 2000 MCG tablet, Take 1 tablet (2,000 mcg total) by mouth daily. (Patient not taking: Reported on 03/19/2022), Disp: 90 tablet, Rfl: 1   ezetimibe (ZETIA) 10 MG tablet, TAKE 1 TABLET BY MOUTH DAILY, Disp: 60 tablet, Rfl: 5   fluticasone (FLONASE) 50 MCG/ACT nasal spray, Place  2 sprays into both nostrils daily. (Patient taking differently: Place 2 sprays into both nostrils daily as needed for allergies.), Disp: 48 g, Rfl: 3   Insulin Pen Needle 32G X 6 MM MISC, 1 Act by Does not apply route once a week., Disp: 30 each, Rfl: 1   lamoTRIgine (LAMICTAL) 200 MG tablet, Take 1 tablet (200 mg total) by mouth at bedtime., Disp: 100 tablet, Rfl: 1   nebivolol (BYSTOLIC) 10 MG tablet, TAKE 1 TABLET BY MOUTH DAILY, Disp: 100 tablet, Rfl: 0   Potassium Citrate 15 MEQ (1620 MG) TBCR, TAKE 1 TABLET BY MOUTH IN THE  MORNING , AT NOON, AND AT  BEDTIME, Disp: 200 tablet, Rfl: 1   spironolactone (ALDACTONE) 100 MG tablet, TAKE 1 TABLET BY MOUTH DAILY, Disp: 100 tablet, Rfl: 1   STIOLTO RESPIMAT 2.5-2.5  MCG/ACT AERS, USE 2 INHALATIONS BY MOUTH  DAILY, Disp: 12 g, Rfl: 1  Social History   Tobacco Use  Smoking Status Every Day   Packs/day: 1.00   Years: 35.00   Total pack years: 35.00   Types: Cigarettes  Smokeless Tobacco Never    Allergies  Allergen Reactions   Azathioprine Other (See Comments)    Side effects to pancrease      Crestor [Rosuvastatin Calcium] Other (See Comments)    Muscle and joint aches   Lipitor [Atorvastatin Calcium] Other (See Comments)    Muscle cramps   Semaglutide Other (See Comments)    Constipation    Lisinopril     Uncontrollable head shaking    Objective:  There were no vitals filed for this visit. There is no height or weight on file to calculate BMI. Constitutional Well developed. Well nourished.  Vascular Dorsalis pedis pulses palpable bilaterally. Posterior tibial pulses palpable bilaterally. Capillary refill normal to all digits.  No cyanosis or clubbing noted. Pedal hair growth normal.  Neurologic Normal speech. Oriented to person, place, and time. Epicritic sensation to light touch grossly present bilaterally.  Dermatologic Thickened elongated dystrophic toenails x10 mild pain on palpation. No open wounds. No skin lesions.  Orthopedic: Right forefoot dependent rubor noted.  Some blanching on pressure noted.  No signs of cellulitis noted.  No open wounds or lesion noted.   Radiographs: None Assessment:   1. Dependent rubor   2. Pain due to onychomycosis of toenails of both feet    Plan:  Patient was evaluated and treated and all questions answered.  Right foot dependent rubor with underlying PAD history -All question were discussed with the patient in extensive detail. -Patient states that she has been closely followed by vascular physicians and has angioplasty scheduled with possible stenting next week.  I discussed with the patient to continue clinically monitor the redness and see if there is any improvement after  restoration of flow.  At this time patient does not have any wounds or clinical concern for infection  Onychomycosis with pain  -Nails palliatively debrided as below. -Educated on self-care  Procedure: Nail Debridement Rationale: pain  Type of Debridement: manual, sharp debridement. Instrumentation: Nail nipper, rotary burr. Number of Nails: 10  Procedures and Treatment: Consent by patient was obtained for treatment procedures. The patient understood the discussion of treatment and procedures well. All questions were answered thoroughly reviewed. Debridement of mycotic and hypertrophic toenails, 1 through 5 bilateral and clearing of subungual debris. No ulceration, no infection noted.  Return Visit-Office Procedure: Patient instructed to return to the office for a follow up visit 3 months for continued evaluation  and treatment.  Boneta Lucks, DPM    No follow-ups on file.   No follow-ups on file.   Right dependent rubor has a vascular follow-up next week with a possible stenting  Toenails x10

## 2022-03-15 ENCOUNTER — Encounter: Payer: Medicare Other | Admitting: Gastroenterology

## 2022-03-20 ENCOUNTER — Ambulatory Visit (HOSPITAL_COMMUNITY)
Admission: RE | Admit: 2022-03-20 | Discharge: 2022-03-20 | Disposition: A | Discharge status: Home / Self Care | Payer: Medicare Other | Attending: Surgery | Admitting: Surgery

## 2022-03-20 ENCOUNTER — Ambulatory Visit (HOSPITAL_COMMUNITY)
Admission: RE | Disposition: A | Discharge status: Home / Self Care | Payer: Self-pay | Source: Home / Self Care | Attending: Surgery

## 2022-03-20 ENCOUNTER — Other Ambulatory Visit: Payer: Self-pay

## 2022-03-20 DIAGNOSIS — E785 Hyperlipidemia, unspecified: Secondary | ICD-10-CM | POA: Insufficient documentation

## 2022-03-20 DIAGNOSIS — E1151 Type 2 diabetes mellitus with diabetic peripheral angiopathy without gangrene: Secondary | ICD-10-CM | POA: Diagnosis not present

## 2022-03-20 DIAGNOSIS — I10 Essential (primary) hypertension: Secondary | ICD-10-CM | POA: Insufficient documentation

## 2022-03-20 DIAGNOSIS — I70221 Atherosclerosis of native arteries of extremities with rest pain, right leg: Secondary | ICD-10-CM | POA: Diagnosis not present

## 2022-03-20 DIAGNOSIS — K5090 Crohn's disease, unspecified, without complications: Secondary | ICD-10-CM | POA: Diagnosis not present

## 2022-03-20 DIAGNOSIS — M79606 Pain in leg, unspecified: Secondary | ICD-10-CM

## 2022-03-20 DIAGNOSIS — M79674 Pain in right toe(s): Secondary | ICD-10-CM | POA: Diagnosis not present

## 2022-03-20 DIAGNOSIS — F17210 Nicotine dependence, cigarettes, uncomplicated: Secondary | ICD-10-CM | POA: Insufficient documentation

## 2022-03-20 DIAGNOSIS — I70213 Atherosclerosis of native arteries of extremities with intermittent claudication, bilateral legs: Secondary | ICD-10-CM | POA: Diagnosis not present

## 2022-03-20 HISTORY — PX: ABDOMINAL AORTOGRAM W/LOWER EXTREMITY: CATH118223

## 2022-03-20 HISTORY — PX: PERIPHERAL VASCULAR BALLOON ANGIOPLASTY: CATH118281

## 2022-03-20 LAB — POCT I-STAT, CHEM 8
BUN: 10 mg/dL (ref 8–23)
Calcium, Ion: 1.24 mmol/L (ref 1.15–1.40)
Chloride: 104 mmol/L (ref 98–111)
Creatinine, Ser: 0.6 mg/dL (ref 0.44–1.00)
Glucose, Bld: 111 mg/dL — ABNORMAL HIGH (ref 70–99)
HCT: 46 % (ref 36.0–46.0)
Hemoglobin: 15.6 g/dL — ABNORMAL HIGH (ref 12.0–15.0)
Potassium: 3.7 mmol/L (ref 3.5–5.1)
Sodium: 144 mmol/L (ref 135–145)
TCO2: 25 mmol/L (ref 22–32)

## 2022-03-20 SURGERY — ABDOMINAL AORTOGRAM W/LOWER EXTREMITY
Anesthesia: LOCAL

## 2022-03-20 MED ORDER — FENTANYL CITRATE (PF) 100 MCG/2ML IJ SOLN
INTRAMUSCULAR | Status: AC
  Filled 2022-03-20: qty 2

## 2022-03-20 MED ORDER — SODIUM CHLORIDE 0.9% FLUSH: 3.0000 mL | Freq: Two times a day (BID) | INTRAVENOUS | Status: DC

## 2022-03-20 MED ORDER — CLOPIDOGREL BISULFATE 300 MG PO TABS
ORAL_TABLET | ORAL | Status: DC | PRN
  Administered 2022-03-20: 75 mg via ORAL

## 2022-03-20 MED ORDER — MIDAZOLAM HCL 2 MG/2ML IJ SOLN
INTRAMUSCULAR | Status: DC | PRN
  Administered 2022-03-20: 2 mg via INTRAVENOUS
  Administered 2022-03-20 (×4): 1 mg via INTRAVENOUS

## 2022-03-20 MED ORDER — MIDAZOLAM HCL 2 MG/2ML IJ SOLN
INTRAMUSCULAR | Status: AC
  Filled 2022-03-20: qty 2

## 2022-03-20 MED ORDER — HEPARIN SODIUM (PORCINE) 1000 UNIT/ML IJ SOLN
INTRAMUSCULAR | Status: AC
  Filled 2022-03-20: qty 10

## 2022-03-20 MED ORDER — LIDOCAINE HCL (PF) 1 % IJ SOLN
INTRAMUSCULAR | Status: AC
  Filled 2022-03-20: qty 30

## 2022-03-20 MED ORDER — SODIUM CHLORIDE 0.9 % IV SOLN: INTRAVENOUS | Status: DC

## 2022-03-20 MED ORDER — HYDRALAZINE HCL 20 MG/ML IJ SOLN: 5.0000 mg | INTRAMUSCULAR | Status: DC | PRN

## 2022-03-20 MED ORDER — ASPIRIN 81 MG PO TBEC
81.0000 mg | DELAYED_RELEASE_TABLET | Freq: Every day | ORAL | Status: DC
  Administered 2022-03-20: 81 mg via ORAL
  Filled 2022-03-20: qty 1

## 2022-03-20 MED ORDER — HEPARIN SODIUM (PORCINE) 1000 UNIT/ML IJ SOLN
INTRAMUSCULAR | Status: DC | PRN
  Administered 2022-03-20: 10000 [IU] via INTRAVENOUS

## 2022-03-20 MED ORDER — OXYCODONE HCL 5 MG PO TABS
5.0000 mg | ORAL_TABLET | ORAL | Status: DC | PRN
  Administered 2022-03-20: 5 mg via ORAL
  Filled 2022-03-20: qty 1

## 2022-03-20 MED ORDER — CLOPIDOGREL BISULFATE 75 MG PO TABS: 75.0000 mg | ORAL_TABLET | Freq: Every day | ORAL | 11 refills | Status: DC

## 2022-03-20 MED ORDER — SODIUM CHLORIDE 0.9% FLUSH: 3.0000 mL | INTRAVENOUS | Status: DC | PRN

## 2022-03-20 MED ORDER — HEPARIN (PORCINE) IN NACL 1000-0.9 UT/500ML-% IV SOLN
INTRAVENOUS | Status: AC
  Filled 2022-03-20: qty 500

## 2022-03-20 MED ORDER — MORPHINE SULFATE (PF) 2 MG/ML IV SOLN: 2.0000 mg | INTRAVENOUS | Status: DC | PRN

## 2022-03-20 MED ORDER — LABETALOL HCL 5 MG/ML IV SOLN: 10.0000 mg | INTRAVENOUS | Status: DC | PRN

## 2022-03-20 MED ORDER — ACETAMINOPHEN 325 MG PO TABS
650.0000 mg | ORAL_TABLET | ORAL | Status: DC | PRN
  Filled 2022-03-20: qty 2

## 2022-03-20 MED ORDER — ONDANSETRON HCL 4 MG/2ML IJ SOLN: 4.0000 mg | Freq: Four times a day (QID) | INTRAMUSCULAR | Status: DC | PRN

## 2022-03-20 MED ORDER — LIDOCAINE HCL (PF) 1 % IJ SOLN
INTRAMUSCULAR | Status: DC | PRN
  Administered 2022-03-20: 15 mL

## 2022-03-20 MED ORDER — SODIUM CHLORIDE 0.9 % IV SOLN: 250.0000 mL | INTRAVENOUS | Status: DC | PRN

## 2022-03-20 MED ORDER — ASPIRIN 81 MG PO TBEC: 81.0000 mg | DELAYED_RELEASE_TABLET | Freq: Every day | ORAL | 2 refills | Status: DC

## 2022-03-20 MED ORDER — ASPIRIN 81 MG PO CHEW
CHEWABLE_TABLET | ORAL | Status: AC
  Filled 2022-03-20: qty 1

## 2022-03-20 MED ORDER — FENTANYL CITRATE (PF) 100 MCG/2ML IJ SOLN
INTRAMUSCULAR | Status: DC | PRN
  Administered 2022-03-20: 50 ug via INTRAVENOUS
  Administered 2022-03-20 (×4): 25 ug via INTRAVENOUS

## 2022-03-20 MED ORDER — CLOPIDOGREL BISULFATE 75 MG PO TABS: 75.0000 mg | ORAL_TABLET | Freq: Every day | ORAL | Status: DC

## 2022-03-20 MED ORDER — SODIUM CHLORIDE 0.9 % WEIGHT BASED INFUSION: 1.0000 mL/kg/h | INTRAVENOUS | Status: DC

## 2022-03-20 MED ORDER — CLOPIDOGREL BISULFATE 75 MG PO TABS
ORAL_TABLET | ORAL | Status: AC
  Filled 2022-03-20: qty 1

## 2022-03-20 SURGICAL SUPPLY — 17 items
BAG SNAP BAND KOVER 36X36 (MISCELLANEOUS) IMPLANT
BALLN STERLING OTW 3X100X150 (BALLOONS) ×2
BALLOON STERLING OTW 3X100X150 (BALLOONS) IMPLANT
CATH OMNI FLUSH 5F 65CM (CATHETERS) IMPLANT
COVER DOME SNAP 22 D (MISCELLANEOUS) IMPLANT
DEVICE VASC CLSR CELT ART 6 (Vascular Products) IMPLANT
KIT ENCORE 26 ADVANTAGE (KITS) IMPLANT
KIT MICROPUNCTURE NIT STIFF (SHEATH) IMPLANT
KIT PV (KITS) ×2 IMPLANT
SHEATH CATAPULT 6FR 60 (SHEATH) IMPLANT
SHEATH PINNACLE 5F 10CM (SHEATH) IMPLANT
SHEATH PINNACLE 6F 10CM (SHEATH) IMPLANT
SYR MEDRAD MARK V 150ML (SYRINGE) IMPLANT
TRANSDUCER W/STOPCOCK (MISCELLANEOUS) ×2 IMPLANT
TRAY PV CATH (CUSTOM PROCEDURE TRAY) ×2 IMPLANT
WIRE BENTSON .035X145CM (WIRE) IMPLANT
WIRE G V18X300CM (WIRE) IMPLANT

## 2022-03-20 NOTE — Op Note (Signed)
  Patient name: April May MRN: [id number] DOB: 09/29/1955 Sex: female  03/20/2022 Pre-operative Diagnosis: Right foot pain Post-operative diagnosis:  Same Surgeon:  Wells  Procedure Performed:  1.  Ultrasound-guided access, left femoral artery  2.  Abdominal aortogram  3.  Right lower extremity runoff  4.  Angioplasty, right anterior tibial artery  5.  Angioplasty, right tibioperoneal trunk  6.  Angioplasty right peroneal artery  7.  Conscious sedation, 90 minutes  8.  Closure device, Celt   Indications: [name] with severe right toe pain.  Noninvasive imaging showed reduced ABIs on the right with 0 toe pressure.  She is here for further evaluation and possible intervention.  Procedure:  The patient was identified in the holding area and taken to room 8.  The patient was then placed supine on the table and prepped and draped in the usual sterile fashion.  A time out was called.  Conscious sedation was administered with the use of IV fentanyl and Versed under continuous physician and nurse monitoring.  Heart rate, blood pressure, and oxygen saturation were continuously monitored.  Total sedation time was 90 minutes.  Ultrasound was used to evaluate the left common femoral artery.  It was patent .  A digital ultrasound image was acquired.  A micropuncture needle was used to access the left common femoral artery under ultrasound guidance.  An 018 wire was advanced without resistance and a micropuncture sheath was placed.  The 018 wire was removed and a benson wire was placed.  The micropuncture sheath was exchanged for a 5 french sheath.  An omniflush catheter was advanced over the wire to the level of L-1.  An abdominal angiogram was obtained.  Next, using the omniflush catheter and a benson wire, the aortic bifurcation was crossed and the catheter was placed into theright external iliac artery and right runoff was obtained.    Findings:   Aortogram: No  significant renal artery stenosis was visualized.  The infrarenal abdominal aorta is widely patent.  Bilateral common and external iliac arteries widely patent.  Right Lower Extremity: The right common femoral, profundofemoral, superficial femoral artery are widely patent.  The popliteal arteries widely patent.  There is occlusion of all 3 tibial vessels.  The anterior tibial occludes approximately the mid calf.  The tibioperoneal trunk occludes at its origin with reconstitution of the proximal peroneal artery.  No named vessels are visualized in the lower leg.  A lateral plantar does possibly reconstitute out onto the foot  Left Lower Extremity: Not evaluated  Intervention: After the above images were acquired the decision was made to proceed with intervention.  A 6 French 60 cm sheath was advanced into the right superficial femoral artery and the patient was fully heparinized.  I then used a V-18 wire along with a 3 x 100 Sterling balloon.  The wire went down the anterior tibial artery.  I could advance the wire down to the ankle which is where it met resistance.  I elected to balloon the anterior tibial artery from the ankle up to its origin using a 3 mm balloon.  Completion imaging showed no reopening of the artery.  I repeated balloon angioplasty and again did not get a satisfactory result.  At this point I elected to cannulate the tibioperoneal trunk which I was able to do and then advance the wire into the peroneal artery.  I performed balloon angioplasty using a 3 mm balloon of the tibioperoneal trunk and the   peroneal artery.  After nominal inflation for 2 minutes.  I had reestablished inline flow through the peroneal artery however there was a dissection at the origin of the tibioperoneal trunk.  I repeated a prolonged angioplasty at this level but afterwards still had a residual dissection which I was thinking about stenting.  However I began tissue the remaining portion of the foot and saw that the  peroneal artery now that I had better pictures, did not perfuse the foot very well.  I then advanced the wire further and performed angioplasty of the distal peroneal artery.  After this was performed, there was extravasation in the peroneal artery.  I therefore reinflated the balloon across the origin of the tibioperoneal trunk.  This was done for 5 minutes at nominal pressure.  I then performed completion imaging.  There was no further evidence of extravasation in the dissection at the origin of the tibioperoneal trunk was dramatically improved.  At this point, I elected to stop the procedure as the patient was having a very difficult time staying still and was complaining of a fair amount of pain.  I closed the left groin with a Celt  Impression:  #1  No significant aortoiliac or outflow disease.  No significant outflow disease on the right  #2  Occlusion of all tibial vessels with minimal reconstitution out on the foot.  I was able to perform angioplasty of the anterior tibial artery from the ankle up to its origin however the artery did not open.    #3  I was able to open the tibioperoneal trunk and proximal peroneal artery and treat this with balloon angioplasty.  #4  The patient has diffuse disease out onto the foot.  She has no further options for revascularization    V. [name], M.D., [facility] [place] Office: [phone] Pager:  [phone]

## 2022-03-20 NOTE — Interval H&P Note (Signed)
History and Physical Interval Note:  03/20/2022 11:08 AM  April May  has presented today for surgery, with the diagnosis of atherosclerosis of native artery of both lower extremitites with intermittent claudication.  The various methods of treatment have been discussed with the patient and family. After consideration of risks, benefits and other options for treatment, the patient has consented to  Procedure(s): ABDOMINAL AORTOGRAM W/LOWER EXTREMITY (N/A) as a surgical intervention.  The patient's history has been reviewed, patient examined, no change in status, stable for surgery.  I have reviewed the patient's chart and labs.  Questions were answered to the patient's satisfaction.     [name]

## 2022-03-21 ENCOUNTER — Encounter (HOSPITAL_COMMUNITY): Payer: Self-pay | Admitting: Surgery

## 2022-03-21 ENCOUNTER — Ambulatory Visit: Payer: Medicare Other | Admitting: Gastroenterology

## 2022-03-21 MED FILL — Heparin Sod (Porcine)-NaCl IV Soln 1000 Unit/500ML-0.9%: INTRAVENOUS | Qty: 1000 | Status: AC

## 2022-03-22 ENCOUNTER — Other Ambulatory Visit: Payer: Self-pay | Admitting: Internal Medicine

## 2022-03-22 ENCOUNTER — Encounter: Payer: Self-pay | Admitting: Surgery

## 2022-03-22 DIAGNOSIS — E559 Vitamin D deficiency, unspecified: Secondary | ICD-10-CM

## 2022-03-23 ENCOUNTER — Telehealth: Payer: Self-pay | Admitting: *Deleted

## 2022-03-23 ENCOUNTER — Encounter: Payer: Self-pay | Admitting: Podiatry

## 2022-03-23 ENCOUNTER — Other Ambulatory Visit: Payer: Self-pay | Admitting: Podiatry

## 2022-03-23 ENCOUNTER — Other Ambulatory Visit: Payer: Self-pay | Admitting: Gastroenterology

## 2022-03-23 ENCOUNTER — Other Ambulatory Visit: Payer: Self-pay | Admitting: Internal Medicine

## 2022-03-23 MED ORDER — OXYCODONE-ACETAMINOPHEN 5-325 MG PO TABS: 1.0000 | ORAL_TABLET | ORAL | 0 refills | Status: DC | PRN

## 2022-03-23 NOTE — Telephone Encounter (Signed)
Patient called requesting pain medication for procedure.  I explained that a narcotic is not prescribed for this procedure.  I recommended tylenol providing she has no difficulty taking tylenol.  Patient voiced understanding of the instructions.

## 2022-03-26 ENCOUNTER — Other Ambulatory Visit: Payer: Self-pay | Admitting: Internal Medicine

## 2022-03-26 DIAGNOSIS — R7303 Prediabetes: Secondary | ICD-10-CM

## 2022-03-27 ENCOUNTER — Ambulatory Visit: Payer: Medicare Other | Admitting: Gastroenterology

## 2022-03-27 ENCOUNTER — Encounter: Payer: Self-pay | Admitting: Gastroenterology

## 2022-03-27 ENCOUNTER — Other Ambulatory Visit (INDEPENDENT_AMBULATORY_CARE_PROVIDER_SITE_OTHER): Payer: Medicare Other

## 2022-03-27 ENCOUNTER — Telehealth: Payer: Self-pay | Admitting: Gastroenterology

## 2022-03-27 VITALS — BP 161/102 | HR 67 | Ht 60.0 in | Wt 223.0 lb

## 2022-03-27 DIAGNOSIS — K50813 Crohn's disease of both small and large intestine with fistula: Secondary | ICD-10-CM

## 2022-03-27 DIAGNOSIS — K6289 Other specified diseases of anus and rectum: Secondary | ICD-10-CM | POA: Diagnosis not present

## 2022-03-27 DIAGNOSIS — K603 Anal fistula: Secondary | ICD-10-CM

## 2022-03-27 LAB — CBC WITH DIFFERENTIAL/PLATELET
Basophils Absolute: 0.1 10*3/uL (ref 0.0–0.1)
Basophils Relative: 0.9 % (ref 0.0–3.0)
Eosinophils Absolute: 0.2 10*3/uL (ref 0.0–0.7)
Eosinophils Relative: 1.3 % (ref 0.0–5.0)
HCT: 44.5 % (ref 36.0–46.0)
Hemoglobin: 14.6 g/dL (ref 12.0–15.0)
Lymphocytes Relative: 26.2 % (ref 12.0–46.0)
Lymphs Abs: 3.7 10*3/uL (ref 0.7–4.0)
MCHC: 32.9 g/dL (ref 30.0–36.0)
MCV: 98.9 fl (ref 78.0–100.0)
Monocytes Absolute: 1.3 10*3/uL — ABNORMAL HIGH (ref 0.1–1.0)
Monocytes Relative: 9 % (ref 3.0–12.0)
Neutro Abs: 8.9 10*3/uL — ABNORMAL HIGH (ref 1.4–7.7)
Neutrophils Relative %: 62.6 % (ref 43.0–77.0)
Platelets: 329 10*3/uL (ref 150.0–400.0)
RBC: 4.5 Mil/uL (ref 3.87–5.11)
RDW: 14.5 % (ref 11.5–15.5)
WBC: 14.2 10*3/uL — ABNORMAL HIGH (ref 4.0–10.5)

## 2022-03-27 LAB — COMPREHENSIVE METABOLIC PANEL
ALT: 21 U/L (ref 0–35)
AST: 17 U/L (ref 0–37)
Albumin: 4.1 g/dL (ref 3.5–5.2)
Alkaline Phosphatase: 48 U/L (ref 39–117)
BUN: 9 mg/dL (ref 6–23)
CO2: 25 mEq/L (ref 19–32)
Calcium: 9.8 mg/dL (ref 8.4–10.5)
Chloride: 106 mEq/L (ref 96–112)
Creatinine, Ser: 0.8 mg/dL (ref 0.40–1.20)
GFR: 76.78 mL/min (ref >60.00)
Glucose, Bld: 96 mg/dL (ref 70–99)
Potassium: 3.9 mEq/L (ref 3.5–5.1)
Sodium: 139 mEq/L (ref 135–145)
Total Bilirubin: 0.4 mg/dL (ref 0.2–1.2)
Total Protein: 7.1 g/dL (ref 6.0–8.3)

## 2022-03-27 LAB — C-REACTIVE PROTEIN: CRP: <1 mg/dL (ref 0.5–20.0)

## 2022-03-27 LAB — SEDIMENTATION RATE: Sed Rate: 35 mm/hr — ABNORMAL HIGH (ref 0–30)

## 2022-03-27 MED ORDER — METRONIDAZOLE 500 MG PO TABS: 500.0000 mg | ORAL_TABLET | Freq: Two times a day (BID) | ORAL | 0 refills | Status: DC

## 2022-03-27 MED ORDER — CIPROFLOXACIN HCL 500 MG PO TABS: 500.0000 mg | ORAL_TABLET | Freq: Two times a day (BID) | ORAL | 0 refills | Status: DC

## 2022-03-27 NOTE — Telephone Encounter (Signed)
Patient called asking if her perianal fistula could smell. Informed patient that it could smell with infection or stool. Informed patient that the antibiotics should help and keep appts recommended by Dr. Fuller Plan. Patient verbalized understanding.

## 2022-03-27 NOTE — Progress Notes (Signed)
    Assessment     Crohn's ileitis with presumed anal fistula and suspected perirectal abscess PVD with intervention, started on Plavix and aspirin 1 week ago   Recommendations    CBC, CMP, ESR, CRP  Cipro 500 mg p.o. twice daily, Flagyl 500 mg p.o. twice daily, both for 4 weeks Schedule pelvic MRI Colorectal surgeon referral We discussed discontinuing budesonide and initiating biologic therapy. We will follow-up on that discussion at her next visit REV in 6 weeks    HPI    This is a 66 year old female with a history of Crohn's ileitis status post TI and right colon resection in 2016.  She has been maintained on budesonide.  She has declined recommendations to discontinue budesonide and begin 6-MP or a biologic.  She notes a anal / rectal pain and drainage near her anal opening that started in late September.  She states she had a significant amount of bloody drainage several weeks ago and her symptoms improved however they persist.  She notes frequent bloody drainage and ongoing anal / rectal pain.  She recently underwent of peripheral vascular procedure on November 7 and was started on Plavix and aspirin at that time.    Labs / Imaging       Latest Ref Rng & Units 06/21/2021   10:44 AM 12/14/2020    8:07 AM 09/20/2020   10:36 AM  Hepatic Function  Total Protein 6.0 - 8.3 g/dL 6.8  6.8  7.0   Albumin 3.5 - 5.2 g/dL 3.9  4.4  4.2   AST 0 - 37 U/L _0 ALT 0 - 35 U/L _1 Alk Phosphatase 39 - 117 U/L 44  52  45   Total Bilirubin 0.2 - 1.2 mg/dL 0.6  0.7  0.9   Bilirubin, Direct 0.0 - 0.3 mg/dL 0.1  0.16  0.2        Latest Ref Rng & Units 03/20/2022    9:56 AM 10/30/2021   11:07 AM 08/26/2021    3:28 PM  CBC  WBC 4.0 - 10.5 K/uL  14.2  13.9   Hemoglobin 12.0 - 15.0 g/dL 15.6  15.9  15.1   Hematocrit 36.0 - 46.0 % 46.0  47.9  46.8   Platelets 150.0 - 400.0 K/uL  244.0  304     Current Medications, Allergies, Past Medical History, Past Surgical History, Family  History and Social History were reviewed in [employer] record.   Physical Exam: General: Well developed, well nourished, no acute distress Head: Normocephalic and atraumatic Eyes: Sclerae anicteric, EOMI Ears: Normal auditory acuity Mouth: Not examined Lungs: Clear throughout to auscultation Heart: Regular rate and rhythm; no murmurs, rubs or bruits Abdomen: Soft, non tender and non distended. No masses, hepatosplenomegaly or hernias noted. Normal Bowel sounds Rectal: Apparent fistulous tract opening on the right buttock next to the anal opening, this area is tender.  Rectal examination reveals tenderness and some fullness and fluctuance on the right rectal wall with Hemoccult positive mucus in the vault Musculoskeletal: Symmetrical with no gross deformities  Pulses:  Normal pulses noted Extremities: No clubbing, cyanosis, edema or deformities noted Neurological: Alert oriented x 4, grossly nonfocal Psychological:  Alert and cooperative. Normal mood and affect   Lillyan Hitson T. Fuller Plan, MD 03/27/2022, 9:25 AM

## 2022-03-27 NOTE — Telephone Encounter (Signed)
Inbound call from patient requesting a call to discuss details about her appt today. Please advise.

## 2022-03-27 NOTE — Patient Instructions (Signed)
Your provider has requested that you go to the basement level for lab work before leaving today. Press "B" on the elevator. The lab is located at the first door on the left as you exit the elevator.  We have sent the following medications to your pharmacy for you to pick up at your convenience: Cipro and Flagyl 500 mg twice daily x 1 month.   You have been scheduled for an MRI at [facility] on 04/04/22. Your appointment time is 9:00am. Please arrive to admitting (at main entrance of the hospital) 30 minutes prior to your appointment time for registration purposes. In addition, if you have any metal in your body, have a pacemaker or defibrillator, please be sure to let your ordering physician know. This test typically takes 45 minutes to 1 hour to complete. Should you need to reschedule, please call [phone] to do so.  You have been scheduled for an appointment with Dr. Johney Maine at [facility]. Your appointment is on ____________ at ___________. Please arrive at __________ for registration. Make certain to bring a list of current medications, including any over the counter medications or vitamins. Also bring your co-pay if you have one as well as your insurance cards. [facility] is located at [address]. Should you need to reschedule your appointment, please contact them at [phone].   The [facility] providers would like to encourage you to use [facility] to communicate with providers for non-urgent requests or questions.  Due to long hold times on the telephone, sending your provider a message by [facility] may be a faster and more efficient way to get a response.  Please allow 48 business hours for a response.  Please remember that this is for non-urgent requests.   Due to recent changes in healthcare laws, you may see the results of your imaging and laboratory studies on MyChart before your provider has had a chance to review them.  We understand  that in some cases there may be results that are confusing or concerning to you. Not all laboratory results come back in the same time frame and the provider may be waiting for multiple results in order to interpret others.  Please give [place] 48 hours in order for your provider to thoroughly review all the results before contacting the office for clarification of your results.   Thank you for choosing me and [facility].  Pricilla Riffle. Dagoberto Ligas., MD., Marval Regal

## 2022-03-31 ENCOUNTER — Encounter: Payer: Self-pay | Admitting: Surgery

## 2022-04-03 ENCOUNTER — Telehealth: Payer: Self-pay | Admitting: Internal Medicine

## 2022-04-03 ENCOUNTER — Telehealth: Payer: Self-pay

## 2022-04-03 NOTE — Telephone Encounter (Signed)
Pt called to report she is not satisfied with the vascular vein provider she is seeing, Hazle Nordmann MD. Pt would like a referral to see someone else that Dr. Ronnald Ramp recommends. CALL PT [phone].  Pt says she also uses MyChart if provider wants to message her there.

## 2022-04-03 NOTE — Telephone Encounter (Signed)
Patient states she received an email stating patient needs to bring $180 to her MRI scheduled for tomorrow. Patient states she does not get paid until next week. Patient wanted to make sure our office would not cancel the MRI if she did not bring the money. Informed patient our office would not cancel but she should contact the hospital to make sure she can still come for test without brining money up front. Patient verbalized understanding.

## 2022-04-03 NOTE — Telephone Encounter (Signed)
Patient called requesting to cancel her Mri for tomorrow due to cost requested a call back by today.

## 2022-04-03 NOTE — Telephone Encounter (Signed)
After Mychart msg reply, pt was called by Gwenette Greet, PA to reassure and review concerns.

## 2022-04-04 ENCOUNTER — Ambulatory Visit (HOSPITAL_COMMUNITY)
Admission: RE | Admit: 2022-04-04 | Discharge: 2022-04-04 | Disposition: A | Discharge status: Home / Self Care | Payer: Medicare Other | Source: Ambulatory Visit | Attending: Gastroenterology | Admitting: Gastroenterology

## 2022-04-04 DIAGNOSIS — K6289 Other specified diseases of anus and rectum: Secondary | ICD-10-CM | POA: Diagnosis not present

## 2022-04-04 DIAGNOSIS — K603 Anal fistula: Secondary | ICD-10-CM | POA: Diagnosis not present

## 2022-04-04 DIAGNOSIS — K632 Fistula of intestine: Secondary | ICD-10-CM | POA: Diagnosis not present

## 2022-04-04 MED ORDER — GADOBUTROL 1 MMOL/ML IV SOLN
10.0000 mL | Freq: Once | INTRAVENOUS | Status: AC | PRN
  Administered 2022-04-04: 10 mL via INTRAVENOUS

## 2022-04-08 ENCOUNTER — Other Ambulatory Visit: Payer: Self-pay | Admitting: Internal Medicine

## 2022-04-08 DIAGNOSIS — I10 Essential (primary) hypertension: Secondary | ICD-10-CM

## 2022-04-08 DIAGNOSIS — E876 Hypokalemia: Secondary | ICD-10-CM

## 2022-04-09 ENCOUNTER — Encounter: Payer: Self-pay | Admitting: Gastroenterology

## 2022-04-10 NOTE — Telephone Encounter (Signed)
Refer to MRI result note 04/04/22.

## 2022-04-12 ENCOUNTER — Ambulatory Visit (INDEPENDENT_AMBULATORY_CARE_PROVIDER_SITE_OTHER): Payer: Medicare Other | Admitting: *Deleted

## 2022-04-12 DIAGNOSIS — E538 Deficiency of other specified B group vitamins: Secondary | ICD-10-CM

## 2022-04-12 MED ORDER — CYANOCOBALAMIN 1000 MCG/ML IJ SOLN
1000.0000 ug | Freq: Once | INTRAMUSCULAR | Status: AC
  Administered 2022-04-12: 1000 ug via INTRAMUSCULAR

## 2022-04-12 NOTE — Progress Notes (Signed)
Pls cosign for B12 inj../lmb  

## 2022-04-16 ENCOUNTER — Other Ambulatory Visit: Payer: Self-pay | Admitting: *Deleted

## 2022-04-16 DIAGNOSIS — M79606 Pain in leg, unspecified: Secondary | ICD-10-CM

## 2022-04-16 DIAGNOSIS — I70213 Atherosclerosis of native arteries of extremities with intermittent claudication, bilateral legs: Secondary | ICD-10-CM

## 2022-04-17 ENCOUNTER — Ambulatory Visit: Payer: Self-pay | Admitting: Surgery

## 2022-04-17 ENCOUNTER — Telehealth: Payer: Self-pay

## 2022-04-17 DIAGNOSIS — K624 Stenosis of anus and rectum: Secondary | ICD-10-CM | POA: Diagnosis not present

## 2022-04-17 DIAGNOSIS — I739 Peripheral vascular disease, unspecified: Secondary | ICD-10-CM | POA: Diagnosis not present

## 2022-04-17 DIAGNOSIS — Z720 Tobacco use: Secondary | ICD-10-CM | POA: Diagnosis not present

## 2022-04-17 DIAGNOSIS — Z7901 Long term (current) use of anticoagulants: Secondary | ICD-10-CM | POA: Diagnosis not present

## 2022-04-17 DIAGNOSIS — K5010 Crohn's disease of large intestine without complications: Secondary | ICD-10-CM | POA: Diagnosis not present

## 2022-04-17 DIAGNOSIS — K603 Anal fistula: Secondary | ICD-10-CM | POA: Diagnosis not present

## 2022-04-17 NOTE — Telephone Encounter (Signed)
   Pre-operative Risk Assessment    Patient Name: April May  DOB: [birth year] MRN: [id number]     Request for Surgical Clearance    Procedure:  Anal Examine   Date of Surgery:  Clearance TBD                                 Surgeon:  Dr. Michael Boston Surgeon's Group or Practice Name:  [facility]  Phone number:  [phone] Fax number:  [phone] Attn: Illene Regulus   Type of Clearance Requested:   - Pharmacy:  Hold Clopidogrel (Plavix)     Type of Anesthesia:  General    Additional requests/questions:    Oneal Grout   04/17/2022, 4:17 PM

## 2022-04-18 ENCOUNTER — Ambulatory Visit: Payer: Medicare Other | Admitting: Gastroenterology

## 2022-04-18 NOTE — Telephone Encounter (Signed)
   Patient Name: April May  DOB: 1955/08/01 MRN: [id number]  Primary Cardiologist: None  Chart reviewed as part of pre-operative protocol coverage.   Patient is on Plavix for a history of PAD, managed by vascular surgery.  Therefore, recommendations on holding Plavix prior to procedure should come from managing provider, Dr. Trula Slade.  I will route this recommendation to the requesting party via [place] fax function and remove from pre-op pool.  Please call with questions.  Lenna Sciara, NP 04/18/2022, 9:42 AM

## 2022-04-19 ENCOUNTER — Other Ambulatory Visit: Payer: Self-pay | Admitting: Internal Medicine

## 2022-04-19 ENCOUNTER — Telehealth: Payer: Self-pay | Admitting: Cardiology

## 2022-04-19 ENCOUNTER — Telehealth: Payer: Self-pay | Admitting: Internal Medicine

## 2022-04-19 DIAGNOSIS — I10 Essential (primary) hypertension: Secondary | ICD-10-CM

## 2022-04-19 DIAGNOSIS — F321 Major depressive disorder, single episode, moderate: Secondary | ICD-10-CM

## 2022-04-19 DIAGNOSIS — E559 Vitamin D deficiency, unspecified: Secondary | ICD-10-CM

## 2022-04-19 NOTE — Telephone Encounter (Signed)
Caller & Relationship to patient:Marshella  Call back number:[phone]  Medication(s) to be refilled:   Cholecalciferol (VITAMIN D3) 1.25 MG (50000 UT) CAPS   Preferred Pharmacy:  [id number] [place], [place]

## 2022-04-19 NOTE — Telephone Encounter (Signed)
Follow Up:     April May is calling from Dr Johney Maine office. She needs the Cardiac Clearance, mot the medicine clarence asap please. They are waiting for clearance, so patient can be scheduled for surgery.

## 2022-04-19 NOTE — Telephone Encounter (Signed)
Pt has been scheduled for follow up.

## 2022-04-20 NOTE — Telephone Encounter (Signed)
   Patient Name: April May  DOB: [birth year] MRN: [id number]  Primary Cardiologist: None  Chart reviewed as part of pre-operative protocol coverage.  Called patient to discuss preop clearance.  No answer.  Left message for patient to call back at earliest convenience.   Lenna Sciara, NP 04/20/2022, 8:50 AM

## 2022-04-20 NOTE — Telephone Encounter (Signed)
   Name: April May  DOB: [birth year]  MRN: [id number]   Primary Cardiologist: None  Chart reviewed as part of pre-operative protocol coverage. Patient was contacted 04/20/2022 in reference to pre-operative risk assessment for pending surgery as outlined below.  April May was last seen on 01/30/2022 by Dr. Marlou Porch.  Since that day, April May has done well from a cardiac standpoint.  She denies any new symptoms or concerns.  She is able to complete greater than 4 METS without difficulty.  Therefore, based on ACC/AHA guidelines, the patient would be at acceptable risk for the planned procedure without further cardiovascular testing.   The patient was advised that if she develops new symptoms prior to surgery to contact our office to arrange for a follow-up visit, and she verbalized understanding.  I will route this recommendation to the requesting party via Epic fax function and remove from pre-op pool. Please call with questions.  April Sciara, NP 04/20/2022, 11:00 AM

## 2022-04-23 ENCOUNTER — Ambulatory Visit (HOSPITAL_COMMUNITY)
Admission: RE | Admit: 2022-04-23 | Discharge: 2022-04-23 | Disposition: A | Discharge status: Home / Self Care | Payer: Medicare Other | Source: Ambulatory Visit | Attending: Surgery | Admitting: Surgery

## 2022-04-23 ENCOUNTER — Ambulatory Visit (INDEPENDENT_AMBULATORY_CARE_PROVIDER_SITE_OTHER): Payer: Medicare Other | Admitting: Surgery

## 2022-04-23 ENCOUNTER — Encounter: Payer: Self-pay | Admitting: Surgery

## 2022-04-23 VITALS — BP 129/66 | HR 49 | Temp 97.9°F | Resp 20 | Ht 60.0 in | Wt 220.0 lb

## 2022-04-23 DIAGNOSIS — M79606 Pain in leg, unspecified: Secondary | ICD-10-CM | POA: Insufficient documentation

## 2022-04-23 DIAGNOSIS — I70213 Atherosclerosis of native arteries of extremities with intermittent claudication, bilateral legs: Secondary | ICD-10-CM | POA: Diagnosis not present

## 2022-04-23 NOTE — Progress Notes (Signed)
[facility] [place]  Patient name: April May MRN: [id number] DOB: 10/02/1955 Sex: female   REASON FOR VISIT:    Follow up  [place]:    April May is a 66 y.o. female who I met in October 2023 for evaluation of peripheral vascular disease.  She had been having problems with right toe pain for several weeks.  This had been injected by podiatry, however she was continuing to complain of pain that goes to her big heel to her toe.  She was taking Tylenol to alleviate her symptoms.  Her symptoms were further aggravated by walking.  Her toe pressure on the right was 0.  Her ABI was 0.64 with monophasic waveforms.  She underwent angiography on 03/20/2022.  She was found to have no significant aortoiliac or outflow disease.  She did have occlusion of all tibial vessels with her minimal reconstitution on the foot.  I was able to open the tibioperoneal trunk and proximal peroneal artery and treat this with balloon angioplasty.  I felt that she did not have any further options for revascularization.  She is back today for follow-up.  She is complaining of coldness in her right.  She does not have any open wounds.  Patient suffers from hypercholesterolemia. She is intolerant of statins and is on Zetia. She has type 2 diabetes. She is medically managed for hypertension. She is treated for Crohn's disease as well as hypertension. She is a current smoker.  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   Vitamin D deficiency      FAMILY HISTORY:   Family History  Problem Relation Age of Onset   Hypertension Mother    Ulcerative colitis Mother    Crohn's disease Mother    Heart disease Mother    Kidney disease Mother    Arthritis Father    Brain cancer Father    Diabetes Father    Alcohol abuse Paternal Uncle    Diabetes Paternal Grandmother    Stroke Paternal Grandmother    Heart failure Sister    Kidney disease Sister    Heart failure Brother    Other Sister        prediabetes   Kidney cancer Brother     SOCIAL HISTORY:   Social History   Tobacco Use   Smoking status: Every Day    Packs/day: 0.75    Years: 35.00    Total pack years: 26.25    Types: Cigarettes   Smokeless tobacco: Never  Substance Use Topics   Alcohol use: No     ALLERGIES:   Allergies  Allergen Reactions   Azathioprine Other (See Comments)    Side effects to pancrease      Crestor [Rosuvastatin Calcium] Other (See Comments)    Muscle and joint aches   Lipitor [Atorvastatin Calcium] Other (See Comments)    Muscle cramps   Semaglutide Other (See  Comments)    Constipation    Nsaids Other (See Comments)    Crohn's disease = NO  NSIADs   Lisinopril     Uncontrollable head shaking      CURRENT MEDICATIONS:   Current Outpatient Medications  Medication Sig Dispense Refill   albuterol (PROAIR HFA) 108 (90 Base) MCG/ACT inhaler TAKE 2 PUFFS BY MOUTH EVERY 6 HOURS AS NEEDED FOR WHEEZE OR SHORTNESS OF BREATH 18 g 5   amLODipine (NORVASC) 10 MG tablet TAKE 1 TABLET BY MOUTH DAILY 100 tablet 0   amoxicillin-clavulanate (AUGMENTIN) 875-125 MG tablet Take 1 tablet by mouth 2 (two) times daily. 20 tablet 0   aspirin EC 81 MG tablet Take 1 tablet (81 mg total) by mouth daily. Swallow whole. 150 tablet 2   budesonide (ENTOCORT EC) 3 MG 24 hr capsule TAKE 3 CAPSULES (9 MG TOTAL) BY MOUTH DAILY. 270 capsule 0   buPROPion (WELLBUTRIN XL) 150 MG 24 hr tablet TAKE 1 TABLET BY MOUTH DAILY  100 tablet 0   Cholecalciferol (VITAMIN D3) 1.25 MG (50000 UT) CAPS Take 1 capsule by mouth once a week. 12 capsule 0   ciprofloxacin (CIPRO) 500 MG tablet Take 1 tablet (500 mg total) by mouth 2 (two) times daily. 60 tablet 0   clonazePAM (KLONOPIN) 0.5 MG tablet Take 1 tablet (0.5 mg total) by mouth 2 (two) times daily as needed for anxiety. 60 tablet 3   clopidogrel (PLAVIX) 75 MG tablet Take 1 tablet (75 mg total) by mouth daily. 30 tablet 11   [facility] PAIN RELIEF EXTRA STRENGTH 500 MG tablet TAKE 1 TABLET BY MOUTH EVERY 8 HOURS AS NEEDED FOR MODERATE PAIN (Patient taking differently: Take 500-1,000 mg by mouth every 8 (eight) hours as needed for moderate pain.) 60 tablet 5   Cyanocobalamin (B-12 COMPLIANCE INJECTION IJ) Inject 1 Dose as directed every 30 (thirty) days.     cyanocobalamin 2000 MCG tablet Take 1 tablet (2,000 mcg total) by mouth daily. 90 tablet 1   ezetimibe (ZETIA) 10 MG tablet TAKE 1 TABLET BY MOUTH DAILY 60 tablet 5   fluticasone (FLONASE) 50 MCG/ACT nasal spray Place 2 sprays into both nostrils daily. (Patient taking differently: Place 2 sprays into both nostrils daily as needed for allergies.) 48 g 3   Insulin Pen Needle 32G X 6 MM MISC 1 Act by Does not apply route once a week. 30 each 1   lamoTRIgine (LAMICTAL) 200 MG tablet Take 1 tablet (200 mg total) by mouth at bedtime. 100 tablet 1   metroNIDAZOLE (FLAGYL) 500 MG tablet Take 1 tablet (500 mg total) by mouth 2 (two) times daily. 60 tablet 0   nebivolol (BYSTOLIC) 10 MG tablet TAKE 1 TABLET BY MOUTH DAILY 100 tablet 0   oxyCODONE-acetaminophen (PERCOCET) 5-325 MG tablet Take 1 tablet by mouth every 4 (four) hours as needed for severe pain. 30 tablet 0   oxyCODONE-acetaminophen (PERCOCET) 5-325 MG tablet Take 1 tablet by mouth every 4 (four) hours as needed for severe pain. 30 tablet 0   Potassium Citrate 15 MEQ (1620 MG) TBCR TAKE 1 TABLET BY MOUTH IN THE  MORNING AT NOON, AND AT BEDTIME 300 tablet 0   spironolactone  (ALDACTONE) 100 MG tablet TAKE 1 TABLET BY MOUTH DAILY 100 tablet 1   STIOLTO RESPIMAT 2.5-2.5 MCG/ACT AERS USE 2 INHALATIONS BY MOUTH  DAILY 12 g 1   No current facility-administered medications for this visit.    REVIEW OF SYSTEMS:   _0  denotes positive finding, _1   denotes negative finding Cardiac  Comments:  Chest pain or chest pressure:    Shortness of breath upon exertion:    Short of breath when lying flat:    Irregular heart rhythm:        Vascular    Pain in calf, thigh, or hip brought on by ambulation:    Pain in feet at night that wakes you up from your sleep:     Blood clot in your veins:    Leg swelling:         Pulmonary    Oxygen at home:    Productive cough:     Wheezing:         Neurologic    Sudden weakness in arms or legs:     Sudden numbness in arms or legs:     Sudden onset of difficulty speaking or slurred speech:    Temporary loss of vision in one eye:     Problems with dizziness:         Gastrointestinal    Blood in stool:     Vomited blood:         Genitourinary    Burning when urinating:     Blood in urine:        Psychiatric    Major depression:         Hematologic    Bleeding problems:    Problems with blood clotting too easily:        Skin    Rashes or ulcers:        Constitutional    Fever or chills:      PHYSICAL EXAM:   Vitals:   04/23/22 0925  BP: 129/66  Pulse: (!) 49  Resp: 20  Temp: 97.9 F (36.6 C)  SpO2: 97%  Weight: 220 lb (99.8 kg)  Height: 5' (1.524 m)    GENERAL: The patient is a well-nourished female, in no acute distress. The vital signs are documented above. CARDIAC: There is a regular rate and rhythm.  PULMONARY: Non-labored respirations MUSCULOSKELETAL: There are no major deformities or cyanosis. NEUROLOGIC: No focal weakness or paresthesias are detected. SKIN: There are no ulcers or rashes noted. PSYCHIATRIC: The patient has a normal affect.  STUDIES:   I reviewed the  following: +-------+-----------+-----------+------------+------------+  ABI/TBIToday's ABIToday's TBIPrevious ABIPrevious TBI  +-------+-----------+-----------+------------+------------+  Right 0.83       0.00       0.64        0.00          +-------+-----------+-----------+------------+------------+  Left  1.12       0.32       1.19        0.61          +-------+-----------+-----------+------------+------------+  Right ABIs appear increased compared to prior study on 03/12/2022. Left  ABIs and right TBI appear essentially unchanged compared to prior study on  03/12/2022. Left TBI is decreased when compared to prior study on  03/12/2022  MEDICAL ISSUES:   The patient underwent balloon angioplasty of an occluded peroneal artery.  She has diffuse disease out onto the foot.  She has limited options for revascularization.  She did have a improvement in her ABIs from 0.64 to 0.83.  I will plan for follow-up in 6 months.  She would like to get a second opinion regarding potential additional options for revascularization, which I have encouraged her to do.    Leia Alf, MD, FACS [facility] [phone]  [phone] Pager [phone]  

## 2022-04-24 ENCOUNTER — Ambulatory Visit (INDEPENDENT_AMBULATORY_CARE_PROVIDER_SITE_OTHER): Payer: Medicare Other | Admitting: Internal Medicine

## 2022-04-24 ENCOUNTER — Encounter: Payer: Self-pay | Admitting: Internal Medicine

## 2022-04-24 VITALS — BP 138/76 | HR 50 | Temp 97.7°F | Resp 16 | Ht 60.0 in | Wt 225.0 lb

## 2022-04-24 DIAGNOSIS — E876 Hypokalemia: Secondary | ICD-10-CM

## 2022-04-24 DIAGNOSIS — R7303 Prediabetes: Secondary | ICD-10-CM | POA: Diagnosis not present

## 2022-04-24 DIAGNOSIS — T502X5A Adverse effect of carbonic-anhydrase inhibitors, benzothiadiazides and other diuretics, initial encounter: Secondary | ICD-10-CM | POA: Diagnosis not present

## 2022-04-24 DIAGNOSIS — E559 Vitamin D deficiency, unspecified: Secondary | ICD-10-CM | POA: Diagnosis not present

## 2022-04-24 DIAGNOSIS — I10 Essential (primary) hypertension: Secondary | ICD-10-CM

## 2022-04-24 DIAGNOSIS — F321 Major depressive disorder, single episode, moderate: Secondary | ICD-10-CM

## 2022-04-24 LAB — HEMOGLOBIN A1C: Hgb A1c MFr Bld: 6 % (ref 4.6–6.5)

## 2022-04-24 LAB — VITAMIN D 25 HYDROXY (VIT D DEFICIENCY, FRACTURES): VITD: 59.3 ng/mL (ref 30.00–100.00)

## 2022-04-24 MED ORDER — VITAMIN D3 1.25 MG (50000 UT) PO CAPS: 1.0000 | ORAL_CAPSULE | ORAL | 0 refills | Status: DC

## 2022-04-24 NOTE — Progress Notes (Signed)
Subjective:  Patient ID: April May, female    DOB: November 03, 1955  Age: 66 y.o. MRN: [id number]  CC: Hypertension and Diabetes     HPI [facility] presents for f/up -  She recently underwent a right lower extremity vascular procedure.  She is active and denies chest pain, shortness of breath, palpitations, diaphoresis, dizziness, lightheadedness, or edema.  Outpatient Medications Prior to Visit  Medication Sig Dispense Refill   albuterol (PROAIR HFA) 108 (90 Base) MCG/ACT inhaler TAKE 2 PUFFS BY MOUTH EVERY 6 HOURS AS NEEDED FOR WHEEZE OR SHORTNESS OF BREATH 18 g 5   amLODipine (NORVASC) 10 MG tablet TAKE 1 TABLET BY MOUTH DAILY 100 tablet 0   aspirin EC 81 MG tablet Take 1 tablet (81 mg total) by mouth daily. Swallow whole. 150 tablet 2   budesonide (ENTOCORT EC) 3 MG 24 hr capsule TAKE 3 CAPSULES (9 MG TOTAL) BY MOUTH DAILY. 270 capsule 0   buPROPion (WELLBUTRIN XL) 150 MG 24 hr tablet TAKE 1 TABLET BY MOUTH DAILY 100 tablet 0   clonazePAM (KLONOPIN) 0.5 MG tablet Take 1 tablet (0.5 mg total) by mouth 2 (two) times daily as needed for anxiety. 60 tablet 3   clopidogrel (PLAVIX) 75 MG tablet Take 1 tablet (75 mg total) by mouth daily. 30 tablet 11   [employer] PAIN RELIEF EXTRA STRENGTH 500 MG tablet TAKE 1 TABLET BY MOUTH EVERY 8 HOURS AS NEEDED FOR MODERATE PAIN (Patient taking differently: Take 500-1,000 mg by mouth every 8 (eight) hours as needed for moderate pain.) 60 tablet 5   cyanocobalamin 2000 MCG tablet Take 1 tablet (2,000 mcg total) by mouth daily. 90 tablet 1   ezetimibe (ZETIA) 10 MG tablet TAKE 1 TABLET BY MOUTH DAILY 60 tablet 5   fluticasone (FLONASE) 50 MCG/ACT nasal spray Place 2 sprays into both nostrils daily. (Patient taking differently: Place 2 sprays into both nostrils daily as needed for allergies.) 48 g 3   lamoTRIgine (LAMICTAL) 200 MG tablet Take 1 tablet (200 mg total) by mouth at bedtime. 100 tablet 1   nebivolol (BYSTOLIC) 10 MG tablet  TAKE 1 TABLET BY MOUTH DAILY 100 tablet 0   Potassium Citrate 15 MEQ (1620 MG) TBCR TAKE 1 TABLET BY MOUTH IN THE  MORNING AT NOON, AND AT BEDTIME 300 tablet 0   spironolactone (ALDACTONE) 100 MG tablet TAKE 1 TABLET BY MOUTH DAILY 100 tablet 1   STIOLTO RESPIMAT 2.5-2.5 MCG/ACT AERS USE 2 INHALATIONS BY MOUTH  DAILY 12 g 1   amoxicillin-clavulanate (AUGMENTIN) 875-125 MG tablet Take 1 tablet by mouth 2 (two) times daily. 20 tablet 0   Cholecalciferol (VITAMIN D3) 1.25 MG (50000 UT) CAPS Take 1 capsule by mouth once a week. 12 capsule 0   ciprofloxacin (CIPRO) 500 MG tablet Take 1 tablet (500 mg total) by mouth 2 (two) times daily. 60 tablet 0   Cyanocobalamin (B-12 COMPLIANCE INJECTION IJ) Inject 1 Dose as directed every 30 (thirty) days.     Insulin Pen Needle 32G X 6 MM MISC 1 Act by Does not apply route once a week. 30 each 1   metroNIDAZOLE (FLAGYL) 500 MG tablet Take 1 tablet (500 mg total) by mouth 2 (two) times daily. 60 tablet 0   oxyCODONE-acetaminophen (PERCOCET) 5-325 MG tablet Take 1 tablet by mouth every 4 (four) hours as needed for severe pain. 30 tablet 0   oxyCODONE-acetaminophen (PERCOCET) 5-325 MG tablet Take 1 tablet by mouth every 4 (four) hours as needed for  severe pain. 30 tablet 0   No facility-administered medications prior to visit.    ROS Review of Systems  Constitutional: Negative.  Negative for diaphoresis, fatigue and unexpected weight change.  HENT: Negative.    Eyes: Negative.   Respiratory: Negative.  Negative for cough, chest tightness, shortness of breath and wheezing.   Cardiovascular:  Negative for chest pain, palpitations and leg swelling.  Gastrointestinal:  Negative for abdominal pain, constipation, diarrhea, nausea and vomiting.  Endocrine: Negative.   Genitourinary: Negative.   Musculoskeletal: Negative.  Negative for arthralgias and myalgias.  Skin: Negative.   Neurological:  Negative for dizziness, weakness, light-headedness and headaches.   Hematological:  Negative for adenopathy. Does not bruise/bleed easily.  Psychiatric/Behavioral: Negative.      Objective:  BP 138/76 (BP Location: Right Arm, Patient Position: Sitting, Cuff Size: Large)   Pulse (!) 50   Temp 97.7 F (36.5 C) (Oral)   Resp 16   Ht 5' (1.524 m)   Wt 225 lb (102.1 kg)   LMP 05/14/1988   SpO2 97%   BMI 43.94 kg/m   BP Readings from Last 3 Encounters:  04/24/22 138/76  04/23/22 129/66  03/27/22 (!) 161/102    Wt Readings from Last 3 Encounters:  04/24/22 225 lb (102.1 kg)  04/23/22 220 lb (99.8 kg)  03/27/22 223 lb (101.2 kg)    Physical Exam Vitals reviewed.  HENT:     Mouth/Throat:     Mouth: Mucous membranes are moist.  Eyes:     General: No scleral icterus.    Conjunctiva/sclera: Conjunctivae normal.  Neck:     Vascular: No carotid bruit.  Cardiovascular:     Rate and Rhythm: Regular rhythm. Bradycardia present.     Heart sounds: No murmur heard.    No friction rub. No gallop.  Pulmonary:     Effort: Pulmonary effort is normal.     Breath sounds: No stridor. No wheezing, rhonchi or rales.  Abdominal:     General: Abdomen is flat.     Palpations: There is no mass.     Tenderness: There is no abdominal tenderness. There is no guarding.     Hernia: No hernia is present.  Musculoskeletal:        General: Normal range of motion.     Cervical back: Neck supple.     Right lower leg: No edema.     Left lower leg: No edema.  Skin:    General: Skin is warm and dry.  Neurological:     General: No focal deficit present.     Mental Status: She is alert.  Psychiatric:        Mood and Affect: Mood normal.        Behavior: Behavior normal.     Lab Results  Component Value Date   WBC 14.2 (H) 03/27/2022   HGB 14.6 03/27/2022   HCT 44.5 03/27/2022   PLT 329.0 03/27/2022   GLUCOSE 96 03/27/2022   CHOL 109 06/21/2021   TRIG 77.0 06/21/2021   HDL 48.70 06/21/2021   LDLCALC 45 06/21/2021   ALT 21 03/27/2022   AST 17  03/27/2022   NA 139 03/27/2022   K 3.9 03/27/2022   CL 106 03/27/2022   CREATININE 0.80 03/27/2022   BUN 9 03/27/2022   CO2 25 03/27/2022   TSH 1.70 06/21/2021   HGBA1C 6.0 04/24/2022   MICROALBUR <0.7 06/10/2019    VAS [place] ABI WITH/WO TBI  Result Date: 04/23/2022  LOWER EXTREMITY DOPPLER STUDY  Patient Name:  Davelyn Gwinn  Date of Exam:   04/23/2022 Medical Rec #: [id number]                 Accession #:    [id number] Date of Birth: [birth year]                 Patient Gender: F Patient Age:   34 years Exam Location:  [facility] Procedure:      VAS [place] ABI WITH/WO TBI Referring Phys: Harold Barban --------------------------------------------------------------------------------  Indications: Claudication, and peripheral artery disease. post-intervention              follow up High Risk Factors: Hypertension, Diabetes.  Vascular Interventions: 03/20/22 abdominal aortogram with RLE runoff, right                         anterior tibial artery angioplasty, right tibioperoneal                         trunk angioplasty, right peroneal artery angioplasty. Comparison Study: 03/12/22- right 0.64/0.00, left=1.19/0.61 Performing Technologist: Maudry Mayhew MHA, RVT, RDCS, RDMS  Examination Guidelines: A complete evaluation includes at minimum, Doppler waveform signals and systolic blood pressure reading at the level of bilateral brachial, anterior tibial, and posterior tibial arteries, when vessel segments are accessible. Bilateral testing is considered an integral part of a complete examination. Photoelectric Plethysmograph (PPG) waveforms and toe systolic pressure readings are included as required and additional duplex testing as needed. Limited examinations for reoccurring indications may be performed as noted.  ABI Findings: +--------+------------------+-----+----------+--------+ Right   Rt Pressure (mmHg)IndexWaveform  Comment   +--------+------------------+-----+----------+--------+ [id number]                                       +--------+------------------+-----+----------+--------+ PTA     120               0.83 monophasic         +--------+------------------+-----+----------+--------+ DP      108               0.74 monophasic         +--------+------------------+-----+----------+--------+ +---------+------------------+-----+---------+-------+ Left     Lt Pressure (mmHg)IndexWaveform Comment +---------+------------------+-----+---------+-------+ Brachial 145                                     +---------+------------------+-----+---------+-------+ PTA      151               1.04 triphasic        +---------+------------------+-----+---------+-------+ DP       163               1.12 triphasic        +---------+------------------+-----+---------+-------+ Great Toe46                0.32                  +---------+------------------+-----+---------+-------+ +-------+-----------+-----------+------------+------------+ ABI/TBIToday's ABIToday's TBIPrevious ABIPrevious TBI +-------+-----------+-----------+------------+------------+ Right  0.83       0.00       0.64        0.00         +-------+-----------+-----------+------------+------------+ Left   1.12       0.32  1.19        0.61         +-------+-----------+-----------+------------+------------+ Right pedal acceleration time= 121m, left pedal acceleration time=634m within normal limits bilaterally.  Right ABIs appear increased compared to prior study on 03/12/2022. Left ABIs and right TBI appear essentially unchanged compared to prior study on 03/12/2022. Left TBI is decreased when compared to prior study on 03/12/2022.  Summary: Right: Resting right ankle-brachial index indicates mild right lower extremity arterial disease. Absent right great toe PPG waveform at rest. Left: Resting left ankle-brachial index is  within normal range. The left toe-brachial index is abnormal. *See table(s) above for measurements and observations.  Electronically signed by VaHarold BarbanD on 04/23/2022 at 1:29:19 PM.    Final     Assessment & Plan:   PhKhaleaas seen today for hypertension and diabetes.  Diagnoses and all orders for this visit:  Vitamin D deficiency- Her vitamin D is in the normal range. -     VITAMIN D 25 Hydroxy (Vit-D Deficiency, Fractures); Future -     VITAMIN D 25 Hydroxy (Vit-D Deficiency, Fractures) -     Discontinue: Cholecalciferol (VITAMIN D3) 1.25 MG (50000 UT) CAPS; Take 1 capsule by mouth once a week.  Malignant hypertension- Her blood pressure is adequately well-controlled.  Current moderate episode of major depressive disorder, unspecified whether recurrent (HCC)  Diuretic-induced hypokalemia- Her recent potassium level was normal.  Prediabetes- Her A1c is at 6.0%. -     Hemoglobin A1c; Future -     Hemoglobin A1c   I have discontinued Georgene W. Geraldo's Vitamin D3, Insulin Pen Needle, amoxicillin-clavulanate, oxyCODONE-acetaminophen, Cyanocobalamin (B-12 COMPLIANCE INJECTION IJ), oxyCODONE-acetaminophen, ciprofloxacin, and metroNIDAZOLE. I am also having her maintain her CVS Pain Relief Extra Strength, fluticasone, cyanocobalamin, albuterol, clonazePAM, Stiolto Respimat, spironolactone, ezetimibe, amLODipine, lamoTRIgine, nebivolol, buPROPion, aspirin EC, clopidogrel, budesonide, and Potassium Citrate.  Meds ordered this encounter  Medications   DISCONTD: Cholecalciferol (VITAMIN D3) 1.25 MG (50000 UT) CAPS    Sig: Take 1 capsule by mouth once a week.    Dispense:  12 capsule    Refill:  0     Follow-up: Return in about 6 months (around 10/24/2022).  ThScarlette CalicoMD

## 2022-04-24 NOTE — Patient Instructions (Signed)
Hypertension, Adult High blood pressure (hypertension) is when the force of blood pumping through the arteries is too strong. The arteries are the blood vessels that carry blood from the heart throughout the body. Hypertension forces the heart to work harder to pump blood and may cause arteries to become narrow or stiff. Untreated or uncontrolled hypertension can lead to a heart attack, heart failure, a stroke, kidney disease, and other problems. A blood pressure reading consists of a higher number over a lower number. Ideally, your blood pressure should be below 120/80. The first ("top") number is called the systolic pressure. It is a measure of the pressure in your arteries as your heart beats. The second ("bottom") number is called the diastolic pressure. It is a measure of the pressure in your arteries as the heart relaxes. What are the causes? The exact cause of this condition is not known. There are some conditions that result in high blood pressure. What increases the risk? Certain factors may make you more likely to develop high blood pressure. Some of these risk factors are under your control, including: Smoking. Not getting enough exercise or physical activity. Being overweight. Having too much fat, sugar, calories, or salt (sodium) in your diet. Drinking too much alcohol. Other risk factors include: Having a personal history of heart disease, diabetes, high cholesterol, or kidney disease. Stress. Having a family history of high blood pressure and high cholesterol. Having obstructive sleep apnea. Age. The risk increases with age. What are the signs or symptoms? High blood pressure may not cause symptoms. Very high blood pressure (hypertensive crisis) may cause: Headache. Fast or irregular heartbeats (palpitations). Shortness of breath. Nosebleed. Nausea and vomiting. Vision changes. Severe chest pain, dizziness, and seizures. How is this diagnosed? This condition is diagnosed by  measuring your blood pressure while you are seated, with your arm resting on a flat surface, your legs uncrossed, and your feet flat on the floor. The cuff of the blood pressure monitor will be placed directly against the skin of your upper arm at the level of your heart. Blood pressure should be measured at least twice using the same arm. Certain conditions can cause a difference in blood pressure between your right and left arms. If you have a high blood pressure reading during one visit or you have normal blood pressure with other risk factors, you may be asked to: Return on a different day to have your blood pressure checked again. Monitor your blood pressure at home for 1 week or longer. If you are diagnosed with hypertension, you may have other blood or imaging tests to help your health care provider understand your overall risk for other conditions. How is this treated? This condition is treated by making healthy lifestyle changes, such as eating healthy foods, exercising more, and reducing your alcohol intake. You may be referred for counseling on a healthy diet and physical activity. Your health care provider may prescribe medicine if lifestyle changes are not enough to get your blood pressure under control and if: Your systolic blood pressure is above 130. Your diastolic blood pressure is above 80. Your personal target blood pressure may vary depending on your medical conditions, your age, and other factors. Follow these instructions at home: Eating and drinking  Eat a diet that is high in fiber and potassium, and low in sodium, added sugar, and fat. An example of this eating plan is called the DASH diet. DASH stands for Dietary Approaches to Stop Hypertension. To eat this way: Eat   plenty of fresh fruits and vegetables. Try to fill one half of your plate at each meal with fruits and vegetables. Eat whole grains, such as whole-wheat pasta, brown rice, or whole-grain bread. Fill about one  fourth of your plate with whole grains. Eat or drink low-fat dairy products, such as skim milk or low-fat yogurt. Avoid fatty cuts of meat, processed or cured meats, and poultry with skin. Fill about one fourth of your plate with lean proteins, such as fish, chicken without skin, beans, eggs, or tofu. Avoid pre-made and processed foods. These tend to be higher in sodium, added sugar, and fat. Reduce your daily sodium intake. Many people with hypertension should eat less than 1,500 mg of sodium a day. Do not drink alcohol if: Your health care provider tells you not to drink. You are pregnant, may be pregnant, or are planning to become pregnant. If you drink alcohol: Limit how much you have to: 0-1 drink a day for women. 0-2 drinks a day for men. Know how much alcohol is in your drink. In the [place] one drink equals one 12 oz bottle of beer (355 mL), one 5 oz glass of wine (148 mL), or one 1 oz glass of hard liquor (44 mL). Lifestyle  Work with your health care provider to maintain a healthy body weight or to lose weight. Ask what an ideal weight is for you. Get at least 30 minutes of exercise that causes your heart to beat faster (aerobic exercise) most days of the week. Activities may include walking, swimming, or biking. Include exercise to strengthen your muscles (resistance exercise), such as Pilates or lifting weights, as part of your weekly exercise routine. Try to do these types of exercises for 30 minutes at least 3 days a week. Do not use any products that contain nicotine or tobacco. These products include cigarettes, chewing tobacco, and vaping devices, such as e-cigarettes. If you need help quitting, ask your health care provider. Monitor your blood pressure at home as told by your health care provider. Keep all follow-up visits. This is important. Medicines Take over-the-counter and prescription medicines only as told by your health care provider. Follow directions carefully. Blood  pressure medicines must be taken as prescribed. Do not skip doses of blood pressure medicine. Doing this puts you at risk for problems and can make the medicine less effective. Ask your health care provider about side effects or reactions to medicines that you should watch for. Contact a health care provider if you: Think you are having a reaction to a medicine you are taking. Have headaches that keep coming back (recurring). Feel dizzy. Have swelling in your ankles. Have trouble with your vision. Get help right away if you: Develop a severe headache or confusion. Have unusual weakness or numbness. Feel faint. Have severe pain in your chest or abdomen. Vomit repeatedly. Have trouble breathing. These symptoms may be an emergency. Get help right away. Call 911. Do not wait to see if the symptoms will go away. Do not drive yourself to the hospital. Summary Hypertension is when the force of blood pumping through your arteries is too strong. If this condition is not controlled, it may put you at risk for serious complications. Your personal target blood pressure may vary depending on your medical conditions, your age, and other factors. For most people, a normal blood pressure is less than 120/80. Hypertension is treated with lifestyle changes, medicines, or a combination of both. Lifestyle changes include losing weight, eating a healthy,   low-sodium diet, exercising more, and limiting alcohol. This information is not intended to replace advice given to you by your health care provider. Make sure you discuss any questions you have with your health care provider. Document Revised: 03/07/2021 Document Reviewed: 03/07/2021 Elsevier Patient Education  2023 [employer] Inc[employer]  

## 2022-04-25 ENCOUNTER — Encounter: Payer: Self-pay | Admitting: Internal Medicine

## 2022-04-25 ENCOUNTER — Other Ambulatory Visit: Payer: Self-pay

## 2022-04-25 ENCOUNTER — Other Ambulatory Visit: Payer: Self-pay | Admitting: Internal Medicine

## 2022-04-25 DIAGNOSIS — E559 Vitamin D deficiency, unspecified: Secondary | ICD-10-CM

## 2022-04-25 DIAGNOSIS — M79606 Pain in leg, unspecified: Secondary | ICD-10-CM

## 2022-04-25 DIAGNOSIS — I70213 Atherosclerosis of native arteries of extremities with intermittent claudication, bilateral legs: Secondary | ICD-10-CM

## 2022-05-02 ENCOUNTER — Encounter: Payer: Self-pay | Admitting: Internal Medicine

## 2022-05-02 ENCOUNTER — Ambulatory Visit: Payer: Medicare Other | Admitting: Podiatry

## 2022-05-02 DIAGNOSIS — L539 Erythematous condition, unspecified: Secondary | ICD-10-CM

## 2022-05-02 DIAGNOSIS — I999 Unspecified disorder of circulatory system: Secondary | ICD-10-CM | POA: Diagnosis not present

## 2022-05-02 MED ORDER — NITRO-BID 2 % TD OINT: 0.5000 [in_us] | TOPICAL_OINTMENT | Freq: Three times a day (TID) | TRANSDERMAL | 1 refills | Status: DC

## 2022-05-02 NOTE — Progress Notes (Signed)
Subjective:  Patient ID: April May, female    DOB: Dec 24, 1955,  MRN: [id number]  Chief Complaint  Patient presents with   Nail Problem    Nail trim     66 y.o. female presents with the above complaint.  Patient presents with thickened elongated dystrophic toenails x10 mild pain on palpation hurts with ambulation worse with pressure.  She has secondary complaint of redness to the top of her right foot.  She is being closely followed by vascular surgery for which she has procedure next week for stenting.  She denies any other acute complaints.  No clinical signs of infection no open wounds or lesion.   Review of Systems: Negative except as noted in the HPI. Denies N/V/F/Ch.  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 (Patient not taking: Reported on 03/19/2022), Disp: 20 tablet, Rfl: 0   aspirin EC 81 MG tablet, Take 1 tablet (81 mg total) by mouth daily. Swallow whole., Disp: 150 tablet, Rfl: 2   budesonide (ENTOCORT EC) 3 MG 24 hr  capsule, Take 3 capsules (9 mg total) by mouth daily., Disp: 90 capsule, Rfl: 1   buPROPion (WELLBUTRIN XL) 150 MG 24 hr tablet, TAKE 1 TABLET BY MOUTH DAILY, Disp: 100 tablet, Rfl: 0   Cholecalciferol (VITAMIN D3) 1.25 MG (50000 UT) CAPS, Take 1 capsule by mouth once a week., Disp: 12 capsule, Rfl: 0   clonazePAM (KLONOPIN) 0.5 MG tablet, Take 1 tablet (0.5 mg total) by mouth 2 (two) times daily as needed for anxiety., Disp: 60 tablet, Rfl: 3   clopidogrel (PLAVIX) 75 MG tablet, Take 1 tablet (75 mg total) by mouth daily., Disp: 30 tablet, Rfl: 11   [employer] PAIN RELIEF EXTRA STRENGTH 500 MG tablet, TAKE 1 TABLET BY MOUTH EVERY 8 HOURS AS NEEDED FOR MODERATE PAIN (Patient taking differently: Take 500-1,000 mg by mouth every 8 (eight) hours as needed for moderate pain.), Disp: 60 tablet, Rfl: 5   Cyanocobalamin (B-12 COMPLIANCE INJECTION IJ), Inject 1 Dose as directed every 30 (thirty) days., Disp: , Rfl:    cyanocobalamin 2000 MCG tablet, Take 1 tablet (2,000 mcg total) by mouth daily. (Patient not taking: Reported on 03/19/2022), Disp: 90 tablet, Rfl: 1   ezetimibe (ZETIA) 10 MG tablet, TAKE 1 TABLET BY MOUTH DAILY, Disp: 60 tablet, Rfl: 5   fluticasone (FLONASE) 50 MCG/ACT nasal spray, Place  2 sprays into both nostrils daily. (Patient taking differently: Place 2 sprays into both nostrils daily as needed for allergies.), Disp: 48 g, Rfl: 3   Insulin Pen Needle 32G X 6 MM MISC, 1 Act by Does not apply route once a week., Disp: 30 each, Rfl: 1   lamoTRIgine (LAMICTAL) 200 MG tablet, Take 1 tablet (200 mg total) by mouth at bedtime., Disp: 100 tablet, Rfl: 1   nebivolol (BYSTOLIC) 10 MG tablet, TAKE 1 TABLET BY MOUTH DAILY, Disp: 100 tablet, Rfl: 0   Potassium Citrate 15 MEQ (1620 MG) TBCR, TAKE 1 TABLET BY MOUTH IN THE  MORNING , AT NOON, AND AT  BEDTIME, Disp: 200 tablet, Rfl: 1   spironolactone (ALDACTONE) 100 MG tablet, TAKE 1 TABLET BY MOUTH DAILY, Disp: 100 tablet, Rfl: 1   STIOLTO RESPIMAT 2.5-2.5  MCG/ACT AERS, USE 2 INHALATIONS BY MOUTH  DAILY, Disp: 12 g, Rfl: 1  Social History   Tobacco Use  Smoking Status Every Day   Packs/day: 1.00   Years: 35.00   Total pack years: 35.00   Types: Cigarettes  Smokeless Tobacco Never    Allergies  Allergen Reactions   Azathioprine Other (See Comments)    Side effects to pancrease      Crestor [Rosuvastatin Calcium] Other (See Comments)    Muscle and joint aches   Lipitor [Atorvastatin Calcium] Other (See Comments)    Muscle cramps   Semaglutide Other (See Comments)    Constipation    Lisinopril     Uncontrollable head shaking    Objective:  There were no vitals filed for this visit. There is no height or weight on file to calculate BMI. Constitutional Well developed. Well nourished.  Vascular Dorsalis pedis pulses palpable bilaterally. Posterior tibial pulses palpable bilaterally. Capillary refill normal to all digits.  No cyanosis or clubbing noted. Pedal hair growth normal.  Neurologic Normal speech. Oriented to person, place, and time. Epicritic sensation to light touch grossly present bilaterally.  Dermatologic Thickened elongated dystrophic toenails x10 mild pain on palpation. No open wounds. No skin lesions.  Orthopedic: Right forefoot dependent rubor noted.  Some blanching on pressure noted.  No signs of cellulitis noted.  No open wounds or lesion noted.   Radiographs: None Assessment:   1. Dependent rubor   2. Pain due to onychomycosis of toenails of both feet    Plan:  Patient was evaluated and treated and all questions answered.  Right foot dependent rubor with underlying PAD history -All question were discussed with the patient in extensive detail. -Patient had angioplasty done with still the same clinical concern.  There is no improvement of toe pain noted.  At this time patient would like to get a second opinion from another vascular surgeon.  Per Dr.Brabham she has limited options for  revascularization. -Referral to another vascular surgeon was made. -Nitro ointment was dispensed to see if it can topically open up toe arteries to help with her pain.

## 2022-05-09 DIAGNOSIS — S93401A Sprain of unspecified ligament of right ankle, initial encounter: Secondary | ICD-10-CM | POA: Diagnosis not present

## 2022-05-09 DIAGNOSIS — I739 Peripheral vascular disease, unspecified: Secondary | ICD-10-CM | POA: Diagnosis not present

## 2022-05-09 DIAGNOSIS — M25571 Pain in right ankle and joints of right foot: Secondary | ICD-10-CM | POA: Diagnosis not present

## 2022-05-09 DIAGNOSIS — S90511A Abrasion, right ankle, initial encounter: Secondary | ICD-10-CM | POA: Diagnosis not present

## 2022-05-09 DIAGNOSIS — M19071 Primary osteoarthritis, right ankle and foot: Secondary | ICD-10-CM | POA: Diagnosis not present

## 2022-05-09 DIAGNOSIS — X58XXXA Exposure to other specified factors, initial encounter: Secondary | ICD-10-CM | POA: Diagnosis not present

## 2022-05-09 DIAGNOSIS — M7989 Other specified soft tissue disorders: Secondary | ICD-10-CM | POA: Diagnosis not present

## 2022-05-09 DIAGNOSIS — S93601A Unspecified sprain of right foot, initial encounter: Secondary | ICD-10-CM | POA: Diagnosis not present

## 2022-05-10 DIAGNOSIS — S90511A Abrasion, right ankle, initial encounter: Secondary | ICD-10-CM | POA: Diagnosis not present

## 2022-05-10 DIAGNOSIS — S9001XA Contusion of right ankle, initial encounter: Secondary | ICD-10-CM | POA: Insufficient documentation

## 2022-05-10 DIAGNOSIS — M7989 Other specified soft tissue disorders: Secondary | ICD-10-CM

## 2022-05-10 DIAGNOSIS — T148XXA Other injury of unspecified body region, initial encounter: Secondary | ICD-10-CM

## 2022-05-10 HISTORY — DX: Other specified soft tissue disorders: M79.89

## 2022-05-10 HISTORY — DX: Other injury of unspecified body region, initial encounter: T14.8XXA

## 2022-05-15 ENCOUNTER — Other Ambulatory Visit (INDEPENDENT_AMBULATORY_CARE_PROVIDER_SITE_OTHER): Payer: Medicare Other

## 2022-05-15 ENCOUNTER — Encounter: Payer: Self-pay | Admitting: Surgery

## 2022-05-15 ENCOUNTER — Ambulatory Visit: Payer: Medicare Other | Admitting: Gastroenterology

## 2022-05-15 ENCOUNTER — Encounter: Payer: Self-pay | Admitting: Gastroenterology

## 2022-05-15 VITALS — BP 124/78 | HR 57 | Ht 60.0 in | Wt 222.8 lb

## 2022-05-15 DIAGNOSIS — K603 Anal fistula: Secondary | ICD-10-CM

## 2022-05-15 DIAGNOSIS — K50819 Crohn's disease of both small and large intestine with unspecified complications: Secondary | ICD-10-CM

## 2022-05-15 LAB — CBC WITH DIFFERENTIAL/PLATELET
Basophils Absolute: 0 10*3/uL (ref 0.0–0.1)
Basophils Relative: 0.2 % (ref 0.0–3.0)
Eosinophils Absolute: 0.1 10*3/uL (ref 0.0–0.7)
Eosinophils Relative: 0.9 % (ref 0.0–5.0)
HCT: 43.1 % (ref 36.0–46.0)
Hemoglobin: 14.2 g/dL (ref 12.0–15.0)
Lymphocytes Relative: 26.5 % (ref 12.0–46.0)
Lymphs Abs: 4.1 10*3/uL — ABNORMAL HIGH (ref 0.7–4.0)
MCHC: 32.9 g/dL (ref 30.0–36.0)
MCV: 97.8 fl (ref 78.0–100.0)
Monocytes Absolute: 1.3 10*3/uL — ABNORMAL HIGH (ref 0.1–1.0)
Monocytes Relative: 8.4 % (ref 3.0–12.0)
Neutro Abs: 9.8 10*3/uL — ABNORMAL HIGH (ref 1.4–7.7)
Neutrophils Relative %: 64 % (ref 43.0–77.0)
Platelets: 356 10*3/uL (ref 150.0–400.0)
RBC: 4.4 Mil/uL (ref 3.87–5.11)
RDW: 15.2 % (ref 11.5–15.5)
WBC: 15.3 10*3/uL — ABNORMAL HIGH (ref 4.0–10.5)

## 2022-05-15 LAB — HIGH SENSITIVITY CRP: CRP, High Sensitivity: 1.95 mg/L (ref 0.000–5.000)

## 2022-05-15 LAB — SEDIMENTATION RATE: Sed Rate: 20 mm/hr (ref 0–30)

## 2022-05-15 NOTE — Progress Notes (Signed)
    Assessment     Crohn's with ileitis and new anal fistulae   Recommendations    Proceed with EUA by Dr. Johney Maine in January 5 CBC, ESR, CRP, HBsAg, QuantiFERON gold today We discussed beginning biologic therapy including discussion of the risks benefits and alternatives.  She is not sure she wants to proceed with biologic therapy. REV in 1 month   HPI    This is a 67 year old female with Crohn's ileitis a new intersphincteric right-sided anal fistula and suspected left-sided fistula.  She is scheduled for an exam under anesthesia by Dr. Johney Maine on January 5.  After completing a course of antibiotics she is currently asymptomatic.   Labs / Imaging       Latest Ref Rng & Units 03/27/2022   10:18 AM 06/21/2021   10:44 AM 12/14/2020    8:07 AM  Hepatic Function  Total Protein 6.0 - 8.3 g/dL 7.1  6.8  6.8   Albumin 3.5 - 5.2 g/dL 4.1  3.9  4.4   AST 0 - 37 U/L _0 ALT 0 - 35 U/L _1 Alk Phosphatase 39 - 117 U/L 48  44  52   Total Bilirubin 0.2 - 1.2 mg/dL 0.4  0.6  0.7   Bilirubin, Direct 0.0 - 0.3 mg/dL  0.1  0.16        Latest Ref Rng & Units 03/27/2022   10:18 AM 03/20/2022    9:56 AM 10/30/2021   11:07 AM  CBC  WBC 4.0 - 10.5 K/uL 14.2   14.2   Hemoglobin 12.0 - 15.0 g/dL 14.6  15.6  15.9   Hematocrit 36.0 - 46.0 % 44.5  46.0  47.9   Platelets 150.0 - 400.0 K/uL 329.0   244.0    Current Medications, Allergies, Past Medical History, Past Surgical History, Family History and Social History were reviewed in [employer] record.   Limited Physical Exam: General: Well developed, well nourished, no acute distress Head: Normocephalic and atraumatic Eyes: Sclerae anicteric, EOMI Ears: Normal auditory acuity Psychological:  Alert and cooperative. Normal mood and affect   Kathleen Tamm T. Fuller Plan, MD 05/15/2022, 9:17 AM

## 2022-05-15 NOTE — Patient Instructions (Signed)
_______________________________________________________  If you are age 67 or older, your body mass index should be between 23-30. Your Body mass index is 43.51 kg/m. If this is out of the aforementioned range listed, please consider follow up with your Primary Care Provider.  If you are age 84 or younger, your body mass index should be between 19-25. Your Body mass index is 43.51 kg/m. If this is out of the aformentioned range listed, please consider follow up with your Primary Care Provider.   ________________________________________________________  The [facility] providers would like to encourage you to use [facility] to communicate with providers for non-urgent requests or questions.  Due to long hold times on the telephone, sending your provider a message by [facility] may be a faster and more efficient way to get a response.  Please allow 48 business hours for a response.  Please remember that this is for non-urgent requests.  _______________________________________________________  Your provider has requested that you go to the basement level for lab work before leaving today. Press "B" on the elevator. The lab is located at the first door on the left as you exit the elevator.

## 2022-05-16 ENCOUNTER — Telehealth: Payer: Self-pay

## 2022-05-16 ENCOUNTER — Encounter (HOSPITAL_BASED_OUTPATIENT_CLINIC_OR_DEPARTMENT_OTHER): Payer: Self-pay | Admitting: Surgery

## 2022-05-16 LAB — HEPATITIS B SURFACE ANTIGEN: Hepatitis B Surface Ag: NONREACTIVE

## 2022-05-16 NOTE — Progress Notes (Addendum)
Spoke w/ via phone for pre-op interview--- pt Lab needs dos---- [employer] results------ current EKG in epic/ chart COVID test -----patient states asymptomatic no test needed Arrive at ------- 0530 on 05-18-2022 NPO after MN NO Solid Food.  Clear liquids from MN until--- 0430 Med rec completed Medications to take morning of surgery ----- bystolic, norvasc Diabetic medication ----- n/a Patient instructed no nail polish to be worn day of surgery Patient instructed to bring photo id and insurance card day of surgery Patient aware to have Driver (ride ) / caregiver    for 24 hours after surgery -- Per pt  is having  son will dropped her and stated daughter-n-law will pick up and will have name / number when check in for driver/ caregiver Patient Special Instructions ----- n/a Pre-Op special Istructions ----- pt has cardiac clearance telephone by Diona Browner NP on 04-20-2022 in epic/ chart.  Called and spoke w/ triage nurse , Elmyra Ricks, requested for vascular clearance from Dr Trula Slade to be faxed for plavix. Pt verbalized was given instruction to only stop plavix 4 days prior to surgery from dr gross office, last dose 05-14-2023 Patient verbalized understanding of instructions that were given at this phone interview. Patient denies shortness of breath, chest pain, fever, cough at this phone interview.   Anesthesia Review:  resistance HTN;   PVD  s/p  balloon angioplasty for right occluded peroneal artery on 03-20-2022;  COPD ;  OSA pt stated uses nightly Pt denies cardiac s&s, sob, but does have right lower extremity swelling from fall on 05-10-2022 (pt stated she and informed dr Trula Slade office)  PCP:  Dr Alona Bene (lov 04-24-2022 Cardiologist :  Dr Marlou Porch Cassell Clement 01-30-2022) Chest x-ray :  CT 02-03-2020 EKG :  08-26-2021 Echo : 10-16-2017 Stress test: nuclear 10-29-2017 Cardiac Cath : no Activity level: denies sob w/ any activity Sleep Study/ CPAP :  yes/ yes  Blood Thinner/  Instructions /Last Dose:  Plavix , last dose 05-14-2022 ASA / Instructions/ Last Dose : ASA '81mg'$ ;  last dose ,05-17-2022

## 2022-05-16 NOTE — Telephone Encounter (Signed)
Our office received a call from [place] from [facility]. She states that the patient had a fall on Friday and in now having Swelling in her RLE. She inquired if pt should continue to hold the Plavix or resume just in case of blood clot. I spoke with our in house PA [facility]. She reviewed pt chart and advise the swelling is not likely being caused by any type of clot and patient is ok to continue to hold Plavix. Information provided to [place], she voiced her understanding.

## 2022-05-17 ENCOUNTER — Ambulatory Visit (INDEPENDENT_AMBULATORY_CARE_PROVIDER_SITE_OTHER): Payer: Medicare Other | Admitting: *Deleted

## 2022-05-17 ENCOUNTER — Telehealth: Payer: Self-pay

## 2022-05-17 DIAGNOSIS — E538 Deficiency of other specified B group vitamins: Secondary | ICD-10-CM | POA: Diagnosis not present

## 2022-05-17 MED ORDER — CYANOCOBALAMIN 1000 MCG/ML IJ SOLN
1000.0000 ug | Freq: Once | INTRAMUSCULAR | Status: AC
  Administered 2022-05-17: 1000 ug via INTRAMUSCULAR

## 2022-05-17 NOTE — Telephone Encounter (Signed)
-----   Message from Serafina Mitchell, MD sent at 05/17/2022  8:25 AM EST ----- Regarding: RE: Advice for Plavix Hold Continue taking aspirin.  Okay to stop Plavix but resume as soon as procedure has been completed. ----- Message ----- From: Gevena Mart, RN Sent: 05/15/2022   8:37 AM EST To: Serafina Mitchell, MD Subject: Advice for Plavix Hold                         This pt is having an [place] with Dr. Michael Boston on 05/18/22. She sent this msg this morning (copied and pasted below). Please advise. Thanks, Building services engineer, RN - Triage   "Hi, I had a procure on my leg with a  artery and a balloon was inserted. I only have 80% circulation. I was told by the surgical doctor for my surgery on Friday to stop taking plavic for four days before surgery. I had a fall  and injured my foot last week and it is swollen and I have a contusion is it safe to still  have my Surgery on Friday regarding  a Fistula? You told them it was ok but that was before  the fall last week. Is there a risk of a blood clot during surgery if I stop the plavic for 4 days before surgery?"

## 2022-05-17 NOTE — Progress Notes (Signed)
Pls cosign for B12 inj../lmb  

## 2022-05-17 NOTE — Telephone Encounter (Signed)
Pt sent Mychart msg requesting guidance on holding Plavix for an upcoming procedure. Pt called a few minutes after sending Mychart msg.  Reviewed pt's chart, returned call for clarification, two identifiers used. Informed her that a staff msg was sent to Dr. Trula Slade for advice.  Dr. Trula Slade responded, called pt to relay information. Confirmed understanding.

## 2022-05-18 ENCOUNTER — Encounter (HOSPITAL_BASED_OUTPATIENT_CLINIC_OR_DEPARTMENT_OTHER): Payer: Self-pay | Admitting: Surgery

## 2022-05-18 ENCOUNTER — Encounter (HOSPITAL_BASED_OUTPATIENT_CLINIC_OR_DEPARTMENT_OTHER)
Admission: RE | Disposition: A | Discharge status: Home / Self Care | Payer: Self-pay | Source: Home / Self Care | Attending: Surgery

## 2022-05-18 ENCOUNTER — Other Ambulatory Visit: Payer: Self-pay

## 2022-05-18 ENCOUNTER — Ambulatory Visit (HOSPITAL_BASED_OUTPATIENT_CLINIC_OR_DEPARTMENT_OTHER)
Admission: RE | Admit: 2022-05-18 | Discharge: 2022-05-18 | Disposition: A | Discharge status: Home / Self Care | Payer: Medicare Other | Attending: Surgery | Admitting: Surgery

## 2022-05-18 ENCOUNTER — Ambulatory Visit (HOSPITAL_BASED_OUTPATIENT_CLINIC_OR_DEPARTMENT_OTHER): Payer: Medicare Other

## 2022-05-18 DIAGNOSIS — G4730 Sleep apnea, unspecified: Secondary | ICD-10-CM | POA: Insufficient documentation

## 2022-05-18 DIAGNOSIS — D235 Other benign neoplasm of skin of trunk: Secondary | ICD-10-CM | POA: Diagnosis not present

## 2022-05-18 DIAGNOSIS — I739 Peripheral vascular disease, unspecified: Secondary | ICD-10-CM | POA: Diagnosis not present

## 2022-05-18 DIAGNOSIS — F17210 Nicotine dependence, cigarettes, uncomplicated: Secondary | ICD-10-CM | POA: Insufficient documentation

## 2022-05-18 DIAGNOSIS — Z7902 Long term (current) use of antithrombotics/antiplatelets: Secondary | ICD-10-CM | POA: Insufficient documentation

## 2022-05-18 DIAGNOSIS — K6289 Other specified diseases of anus and rectum: Secondary | ICD-10-CM | POA: Diagnosis not present

## 2022-05-18 DIAGNOSIS — K641 Second degree hemorrhoids: Secondary | ICD-10-CM | POA: Diagnosis not present

## 2022-05-18 DIAGNOSIS — K620 Anal polyp: Secondary | ICD-10-CM | POA: Diagnosis not present

## 2022-05-18 DIAGNOSIS — K605 Anorectal fistula: Secondary | ICD-10-CM | POA: Insufficient documentation

## 2022-05-18 DIAGNOSIS — Z01818 Encounter for other preprocedural examination: Secondary | ICD-10-CM

## 2022-05-18 DIAGNOSIS — I2510 Atherosclerotic heart disease of native coronary artery without angina pectoris: Secondary | ICD-10-CM | POA: Diagnosis not present

## 2022-05-18 DIAGNOSIS — F17200 Nicotine dependence, unspecified, uncomplicated: Secondary | ICD-10-CM | POA: Diagnosis not present

## 2022-05-18 DIAGNOSIS — J449 Chronic obstructive pulmonary disease, unspecified: Secondary | ICD-10-CM

## 2022-05-18 DIAGNOSIS — I10 Essential (primary) hypertension: Secondary | ICD-10-CM | POA: Diagnosis not present

## 2022-05-18 DIAGNOSIS — K50918 Crohn's disease, unspecified, with other complication: Secondary | ICD-10-CM | POA: Diagnosis not present

## 2022-05-18 DIAGNOSIS — K50913 Crohn's disease, unspecified, with fistula: Secondary | ICD-10-CM | POA: Diagnosis not present

## 2022-05-18 DIAGNOSIS — Z7901 Long term (current) use of anticoagulants: Secondary | ICD-10-CM | POA: Insufficient documentation

## 2022-05-18 DIAGNOSIS — E119 Type 2 diabetes mellitus without complications: Secondary | ICD-10-CM | POA: Diagnosis not present

## 2022-05-18 DIAGNOSIS — E1151 Type 2 diabetes mellitus with diabetic peripheral angiopathy without gangrene: Secondary | ICD-10-CM | POA: Diagnosis not present

## 2022-05-18 DIAGNOSIS — Z6841 Body Mass Index (BMI) 40.0 and over, adult: Secondary | ICD-10-CM | POA: Insufficient documentation

## 2022-05-18 HISTORY — DX: Deficiency of other specified B group vitamins: E53.8

## 2022-05-18 HISTORY — DX: Nausea with vomiting, unspecified: R11.2

## 2022-05-18 HISTORY — DX: Anal fistula: K60.3

## 2022-05-18 HISTORY — DX: Prediabetes: R73.03

## 2022-05-18 HISTORY — DX: Mixed hyperlipidemia: E78.2

## 2022-05-18 HISTORY — DX: Generalized anxiety disorder: F41.1

## 2022-05-18 HISTORY — DX: Long term (current) use of anticoagulants: Z79.01

## 2022-05-18 HISTORY — DX: Unspecified osteoarthritis, unspecified site: M19.90

## 2022-05-18 HISTORY — DX: Chronic kidney disease, stage 1: N18.1

## 2022-05-18 HISTORY — DX: Crohn's disease of both small and large intestine without complications: K50.80

## 2022-05-18 HISTORY — DX: Resistant hypertension: I1A.0

## 2022-05-18 HISTORY — DX: Complete loss of teeth, unspecified cause, unspecified class: K08.109

## 2022-05-18 HISTORY — DX: Disease of anus and rectum, unspecified: K62.9

## 2022-05-18 HISTORY — DX: Obstructive sleep apnea (adult) (pediatric): G47.33

## 2022-05-18 HISTORY — DX: Other specified postprocedural states: Z98.890

## 2022-05-18 HISTORY — DX: Anal fistula, unspecified: K60.30

## 2022-05-18 HISTORY — DX: Major depressive disorder, single episode, unspecified: F32.9

## 2022-05-18 HISTORY — DX: Personal history of other diseases of the digestive system: Z87.19

## 2022-05-18 HISTORY — DX: Atherosclerotic heart disease of native coronary artery without angina pectoris: I25.10

## 2022-05-18 LAB — QUANTIFERON-TB GOLD PLUS
QuantiFERON Mitogen Value: >10 IU/mL
QuantiFERON Nil Value: 0.06 IU/mL
QuantiFERON TB1 Ag Value: 0.06 IU/mL
QuantiFERON TB2 Ag Value: 0.05 IU/mL
QuantiFERON-TB Gold Plus: NEGATIVE

## 2022-05-18 LAB — POCT I-STAT, CHEM 8
BUN: 8 mg/dL (ref 8–23)
Calcium, Ion: 1.18 mmol/L (ref 1.15–1.40)
Chloride: 103 mmol/L (ref 98–111)
Creatinine, Ser: 0.8 mg/dL (ref 0.44–1.00)
Glucose, Bld: 102 mg/dL — ABNORMAL HIGH (ref 70–99)
HCT: 46 % (ref 36.0–46.0)
Hemoglobin: 15.6 g/dL — ABNORMAL HIGH (ref 12.0–15.0)
Potassium: 4.2 mmol/L (ref 3.5–5.1)
Sodium: 141 mmol/L (ref 135–145)
TCO2: 27 mmol/L (ref 22–32)

## 2022-05-18 SURGERY — EXAM UNDER ANESTHESIA
Anesthesia: General | Site: Rectum

## 2022-05-18 MED ORDER — GABAPENTIN 300 MG PO CAPS
300.0000 mg | ORAL_CAPSULE | ORAL | Status: AC
  Administered 2022-05-18: 300 mg via ORAL

## 2022-05-18 MED ORDER — CHLORHEXIDINE GLUCONATE CLOTH 2 % EX PADS: 6.0000 | MEDICATED_PAD | Freq: Once | CUTANEOUS | Status: DC

## 2022-05-18 MED ORDER — PROPOFOL 10 MG/ML IV BOLUS
INTRAVENOUS | Status: DC | PRN
  Administered 2022-05-18: 200 mg via INTRAVENOUS

## 2022-05-18 MED ORDER — OXYCODONE HCL 5 MG PO TABS: 5.0000 mg | ORAL_TABLET | Freq: Four times a day (QID) | ORAL | 0 refills | Status: DC | PRN

## 2022-05-18 MED ORDER — FENTANYL CITRATE (PF) 100 MCG/2ML IJ SOLN
INTRAMUSCULAR | Status: AC
  Filled 2022-05-18: qty 2

## 2022-05-18 MED ORDER — DEXAMETHASONE SODIUM PHOSPHATE 4 MG/ML IJ SOLN
INTRAMUSCULAR | Status: DC | PRN
  Administered 2022-05-18: 10 mg via INTRAVENOUS

## 2022-05-18 MED ORDER — ONDANSETRON HCL 4 MG/2ML IJ SOLN
INTRAMUSCULAR | Status: AC
  Filled 2022-05-18: qty 2

## 2022-05-18 MED ORDER — LACTATED RINGERS IV SOLN: INTRAVENOUS | Status: DC

## 2022-05-18 MED ORDER — FENTANYL CITRATE (PF) 100 MCG/2ML IJ SOLN
INTRAMUSCULAR | Status: DC | PRN
  Administered 2022-05-18: 50 ug via INTRAVENOUS
  Administered 2022-05-18 (×2): 25 ug via INTRAVENOUS

## 2022-05-18 MED ORDER — SODIUM CHLORIDE 0.9 % IV SOLN
2.0000 g | INTRAVENOUS | Status: AC
  Administered 2022-05-18: 2 g via INTRAVENOUS

## 2022-05-18 MED ORDER — ENSURE PRE-SURGERY PO LIQD: 296.0000 mL | Freq: Once | ORAL | Status: DC

## 2022-05-18 MED ORDER — CEFTRIAXONE SODIUM 2 G IJ SOLR
INTRAMUSCULAR | Status: AC
  Filled 2022-05-18: qty 20

## 2022-05-18 MED ORDER — 0.9 % SODIUM CHLORIDE (POUR BTL) OPTIME
TOPICAL | Status: DC | PRN
  Administered 2022-05-18: 500 mL

## 2022-05-18 MED ORDER — LIDOCAINE HCL (CARDIAC) PF 100 MG/5ML IV SOSY
PREFILLED_SYRINGE | INTRAVENOUS | Status: DC | PRN
  Administered 2022-05-18: 60 mg via INTRAVENOUS

## 2022-05-18 MED ORDER — AMISULPRIDE (ANTIEMETIC) 5 MG/2ML IV SOLN: 10.0000 mg | Freq: Once | INTRAVENOUS | Status: DC | PRN

## 2022-05-18 MED ORDER — SODIUM CHLORIDE 0.9 % IV SOLN
INTRAVENOUS | Status: AC
  Filled 2022-05-18: qty 100

## 2022-05-18 MED ORDER — BUPIVACAINE-EPINEPHRINE 0.25% -1:200000 IJ SOLN
INTRAMUSCULAR | Status: DC | PRN
  Administered 2022-05-18: 50 mL via SURGICAL_CAVITY

## 2022-05-18 MED ORDER — EPHEDRINE 5 MG/ML INJ
INTRAVENOUS | Status: AC
  Filled 2022-05-18: qty 5

## 2022-05-18 MED ORDER — METHYLENE BLUE 1 % INJ SOLN
INTRAVENOUS | Status: DC | PRN
  Administered 2022-05-18: 10 mL via SUBMUCOSAL

## 2022-05-18 MED ORDER — EPHEDRINE SULFATE-NACL 50-0.9 MG/10ML-% IV SOSY
PREFILLED_SYRINGE | INTRAVENOUS | Status: DC | PRN
  Administered 2022-05-18: 5 mg via INTRAVENOUS
  Administered 2022-05-18: 10 mg via INTRAVENOUS

## 2022-05-18 MED ORDER — GABAPENTIN 300 MG PO CAPS
ORAL_CAPSULE | ORAL | Status: AC
  Filled 2022-05-18: qty 1

## 2022-05-18 MED ORDER — OXYCODONE HCL 5 MG PO TABS: 5.0000 mg | ORAL_TABLET | Freq: Once | ORAL | Status: DC | PRN

## 2022-05-18 MED ORDER — ACETAMINOPHEN 500 MG PO TABS
1000.0000 mg | ORAL_TABLET | ORAL | Status: AC
  Administered 2022-05-18: 1000 mg via ORAL

## 2022-05-18 MED ORDER — GLYCOPYRROLATE 0.2 MG/ML IJ SOLN
INTRAMUSCULAR | Status: DC | PRN
  Administered 2022-05-18 (×2): .1 mg via INTRAVENOUS

## 2022-05-18 MED ORDER — ROCURONIUM BROMIDE 10 MG/ML (PF) SYRINGE
PREFILLED_SYRINGE | INTRAVENOUS | Status: AC
  Filled 2022-05-18: qty 10

## 2022-05-18 MED ORDER — PHENYLEPHRINE 80 MCG/ML (10ML) SYRINGE FOR IV PUSH (FOR BLOOD PRESSURE SUPPORT)
PREFILLED_SYRINGE | INTRAVENOUS | Status: DC | PRN
  Administered 2022-05-18: 80 ug via INTRAVENOUS

## 2022-05-18 MED ORDER — PROMETHAZINE HCL 25 MG/ML IJ SOLN: 6.2500 mg | INTRAMUSCULAR | Status: DC | PRN

## 2022-05-18 MED ORDER — BUPIVACAINE LIPOSOME 1.3 % IJ SUSP: 20.0000 mL | Freq: Once | INTRAMUSCULAR | Status: DC

## 2022-05-18 MED ORDER — DEXAMETHASONE SODIUM PHOSPHATE 10 MG/ML IJ SOLN
INTRAMUSCULAR | Status: AC
  Filled 2022-05-18: qty 1

## 2022-05-18 MED ORDER — ACETAMINOPHEN 500 MG PO TABS
ORAL_TABLET | ORAL | Status: AC
  Filled 2022-05-18: qty 2

## 2022-05-18 MED ORDER — PROPOFOL 500 MG/50ML IV EMUL
INTRAVENOUS | Status: AC
  Filled 2022-05-18: qty 50

## 2022-05-18 MED ORDER — ONDANSETRON HCL 4 MG/2ML IJ SOLN
INTRAMUSCULAR | Status: DC | PRN
  Administered 2022-05-18: 4 mg via INTRAVENOUS

## 2022-05-18 MED ORDER — SODIUM CHLORIDE 0.9 % IV SOLN: INTRAVENOUS | Status: DC

## 2022-05-18 MED ORDER — HYDROMORPHONE HCL 1 MG/ML IJ SOLN: 0.2500 mg | INTRAMUSCULAR | Status: DC | PRN

## 2022-05-18 MED ORDER — MIDAZOLAM HCL 2 MG/2ML IJ SOLN
INTRAMUSCULAR | Status: AC
  Filled 2022-05-18: qty 2

## 2022-05-18 MED ORDER — MIDAZOLAM HCL 5 MG/5ML IJ SOLN
INTRAMUSCULAR | Status: DC | PRN
  Administered 2022-05-18: 1 mg via INTRAVENOUS

## 2022-05-18 MED ORDER — OXYCODONE HCL 5 MG/5ML PO SOLN: 5.0000 mg | Freq: Once | ORAL | Status: DC | PRN

## 2022-05-18 SURGICAL SUPPLY — 59 items
APL SKNCLS STERI-STRIP NONHPOA (GAUZE/BANDAGES/DRESSINGS)
BALL CTTN LRG ABS STRL LF (GAUZE/BANDAGES/DRESSINGS) ×2
BENZOIN TINCTURE PRP APPL 2/3 (GAUZE/BANDAGES/DRESSINGS) IMPLANT
BLADE CLIPPER SENSICLIP SURGIC (BLADE) IMPLANT
BLADE SURG 10 STRL SS (BLADE) IMPLANT
BLADE SURG 15 STRL LF DISP TIS (BLADE) ×2 IMPLANT
BLADE SURG 15 STRL SS (BLADE) ×2
BRIEF MESH DISP LRG (UNDERPADS AND DIAPERS) ×2 IMPLANT
COTTONBALL LRG STERILE PKG (GAUZE/BANDAGES/DRESSINGS) ×1 IMPLANT
COVER BACK TABLE 60X90IN (DRAPES) ×2 IMPLANT
COVER MAYO STAND STRL (DRAPES) ×2 IMPLANT
DRAPE HYSTEROSCOPY (MISCELLANEOUS) ×2 IMPLANT
DRAPE LAPAROTOMY 100X72 PEDS (DRAPES) IMPLANT
DRAPE SHEET LG 3/4 BI-LAMINATE (DRAPES) IMPLANT
ELECT NDL TIP 2.8 STRL (NEEDLE) IMPLANT
ELECT NEEDLE TIP 2.8 STRL (NEEDLE) IMPLANT
ELECT REM PT RETURN 9FT ADLT (ELECTROSURGICAL) ×2
ELECTRODE REM PT RTRN 9FT ADLT (ELECTROSURGICAL) ×2 IMPLANT
FILTER STRAW (MISCELLANEOUS) ×1 IMPLANT
GAUZE 4X4 16PLY ~~LOC~~+RFID DBL (SPONGE) ×2 IMPLANT
GAUZE PAD ABD 8X10 STRL (GAUZE/BANDAGES/DRESSINGS) ×4 IMPLANT
GAUZE SPONGE 4X4 12PLY STRL (GAUZE/BANDAGES/DRESSINGS) IMPLANT
GLOVE BIOGEL PI IND STRL 8 (GLOVE) IMPLANT
GLOVE ECLIPSE 8.0 STRL XLNG CF (GLOVE) IMPLANT
GOWN STRL REUS W/TWL XL LVL3 (GOWN DISPOSABLE) ×2 IMPLANT
IV CATH 14GX2 1/4 (CATHETERS) IMPLANT
IV CATH 18G SAFETY (IV SOLUTION) ×1 IMPLANT
IV CATH PLACEMENT 20 GA (IV SOLUTION) IMPLANT
KIT SIGMOIDOSCOPE (SET/KITS/TRAYS/PACK) IMPLANT
KIT TURNOVER CYSTO (KITS) ×2 IMPLANT
LEGGING LITHOTOMY PAIR STRL (DRAPES) IMPLANT
LOOP VASCLR MAXI BLUE 18IN ST (MISCELLANEOUS) IMPLANT
LOOP VASCULAR MAXI 18 BLUE (MISCELLANEOUS)
LOOPS VASCLR MAXI BLUE 18IN ST (MISCELLANEOUS) IMPLANT
NEEDLE HYPO 22GX1.5 SAFETY (NEEDLE) ×2 IMPLANT
PACK BASIN DAY SURGERY FS (CUSTOM PROCEDURE TRAY) ×2 IMPLANT
PAD PREP 24X48 CUFFED NSTRL (MISCELLANEOUS) ×2 IMPLANT
PENCIL SMOKE EVACUATOR (MISCELLANEOUS) ×2 IMPLANT
SURGILUBE 2OZ TUBE FLIPTOP (MISCELLANEOUS) ×2 IMPLANT
SUT CHROMIC 2 0 SH (SUTURE) ×1 IMPLANT
SUT CHROMIC 3 0 SH 27 (SUTURE) ×1 IMPLANT
SUT ETHIBOND 0 (SUTURE) ×1 IMPLANT
SUT MNCRL AB 4-0 PS2 18 (SUTURE) IMPLANT
SUT PROLENE 2 0 SH DA (SUTURE) IMPLANT
SUT VIC AB 2-0 UR6 27 (SUTURE) ×4 IMPLANT
SUT VIC AB 3-0 SH 18 (SUTURE) IMPLANT
SWAB COLLECTION DEVICE MRSA (MISCELLANEOUS) IMPLANT
SWAB CULTURE ESWAB REG 1ML (MISCELLANEOUS) IMPLANT
SYR 20ML LL LF (SYRINGE) ×2 IMPLANT
SYR BULB IRRIG 60ML STRL (SYRINGE) IMPLANT
SYR CONTROL 10ML LL (SYRINGE) ×1 IMPLANT
TAPE CLOTH 3X10 TAN LF (GAUZE/BANDAGES/DRESSINGS) ×2 IMPLANT
TOWEL OR 17X26 10 PK STRL BLUE (TOWEL DISPOSABLE) ×4 IMPLANT
TRAY DSU PREP LF (CUSTOM PROCEDURE TRAY) ×2 IMPLANT
TUBE CONNECTING 12X1/4 (SUCTIONS) ×2 IMPLANT
UNDERPAD 30X36 HEAVY ABSORB (UNDERPADS AND DIAPERS) ×2 IMPLANT
VASCULAR TIE MAXI BLUE 18IN ST (MISCELLANEOUS)
WATER STERILE IRR 500ML POUR (IV SOLUTION) ×2 IMPLANT
YANKAUER SUCT BULB TIP NO VENT (SUCTIONS) ×3 IMPLANT

## 2022-05-18 NOTE — Anesthesia Preprocedure Evaluation (Signed)
Anesthesia Evaluation  Patient identified by MRN, date of birth, ID band Patient awake    Reviewed: Allergy & Precautions, NPO status , Patient's Chart, lab work & pertinent test results  History of Anesthesia Complications (+) PONV and history of anesthetic complications  Airway Mallampati: II  TM Distance: >3 FB Neck ROM: Full    Dental  (+) Edentulous Upper, Dental Advisory Given,    Pulmonary sleep apnea , pneumonia, resolved, COPD, Current Smoker and Patient abstained from smoking.   Pulmonary exam normal breath sounds clear to auscultation       Cardiovascular Exercise Tolerance: Good hypertension, Pt. on medications + CAD and + Peripheral Vascular Disease  Normal cardiovascular exam Rhythm:Regular Rate:Normal  ECG: Nonspecific TWA   Neuro/Psych  PSYCHIATRIC DISORDERS Anxiety Depression    negative neurological ROS     GI/Hepatic negative GI ROS, Neg liver ROS,,,  Endo/Other  diabetes, Type 2  Morbid obesity  Renal/GU Renal InsufficiencyRenal disease  negative genitourinary   Musculoskeletal  (+) Arthritis , Osteoarthritis,    Abdominal  (+) + obese  Peds negative pediatric ROS (+)  Hematology negative hematology ROS (+)   Anesthesia Other Findings Chronic steroids  Reproductive/Obstetrics negative OB ROS                             Anesthesia Physical Anesthesia Plan  ASA: III  Anesthesia Plan: General   Post-op Pain Management: Tylenol PO (pre-op)* and Gabapentin PO (pre-op)*   Induction: Intravenous  PONV Risk Score and Plan: 3 and Ondansetron, Dexamethasone, Midazolam and Treatment may vary due to age or medical condition  Airway Management Planned: Oral ETT  Additional Equipment:   Intra-op Plan:   Post-operative Plan: Extubation in OR and Possible Post-op intubation/ventilation  Informed Consent: I have reviewed the patients History and Physical, chart, labs  and discussed the procedure including the risks, benefits and alternatives for the proposed anesthesia with the patient or authorized representative who has indicated his/her understanding and acceptance.     Dental advisory given  Plan Discussed with: CRNA  Anesthesia Plan Comments:         Anesthesia Quick Evaluation

## 2022-05-18 NOTE — H&P (Signed)
05/18/2022    REFERRING PHYSICIAN: Estanislado Emms., MD  Patient Care Team: Janith Lima, MD as PCP - General (Internal Medicine) Estanislado Emms., MD (Gastroenterology) Serita Butcher, MD (General Surgery) Candee Furbish, MD (Cardiovascular Disease)  PROVIDER: Hollace Kinnier, MD  [facility] MRN: [id number] DOB: 08/24/1955  SUBJECTIVE  Chief Complaint: New Consultation (Anal fistula/perirectal abscess)   April May is a 67 y.o. female who is seen today as an office consultation at the request of DrFuller Plan for evaluation of perianal abscess/fistula is in the setting of Crohn's disease.  History of Present Illness:  68 year old woman. Smoker. History of present disease with many decades. Had a resection when she was rather young. Developed bowel obstruction with phlegmon due to Crohn's flare at her anastomosis requiring urgent exploratory laparotomy and resection by Dr. Georgette Dover in our group in 2016. Followed by [facility]. Has been willing to take budesonide own but nothing more aggressive despite GIs recommendations. Had some peripheral vascular disease and underwent angiography with angioplasty of right tibial artery and tibioperoneal trunk. Peroneal artery as well. 03/20/2022. Now fully antiquated on Plavix and aspirin.  Currently patient's had complaints of some perianal pain and drainage. Followed up with gastroenterology. Concern for chronic fistulous disease. MRI pelvis 04/05/2022 raised concern of right-sided intersphincteric anal fistula. Concern for left-sided anal fistulous tract as well. Surgical consultation recommended.  Patient here today by herself. Seems somewhat skeptical or suspicious why she is here. She claims she moves her bowels 3-4 times a day. Just taking the budesonide. She recalls using steroids before. She has not been on any biologic or anything more systemic. Her main issues that she felt pain and swelling on her right side.  Perianal area burst. She feels like it is not bothering as much but occasionally drains. She still smokes a pack a day. She claims she can walk about 20 minutes for her as she has to stop. Denies any major rectal bleeding at this time.  Medical History:  Past Medical History: Diagnosis Date Anxiety Arthritis COPD (chronic obstructive pulmonary disease) (CMS-HCC) History of cancer Hypertension Sleep apnea  Patient Active Problem List Diagnosis Crohn's disease of anal canal (CMS-HCC) Tobacco abuse Peripheral arterial disease (CMS-HCC) Chronic anticoagulation Acquired anal stenosis Intersphincteric fistula  History reviewed. No pertinent surgical history.  Allergies Allergen Reactions Nsaids (Non-Steroidal Anti-Inflammatory Drug) Other (See Comments) Crohn's disease  Current Outpatient Medications on File Prior to Visit Medication Sig Dispense Refill aspirin 81 MG EC tablet Take by mouth budesonide (ENTOCORT EC) 3 mg EC capsule Take by mouth buPROPion (WELLBUTRIN XL) 150 MG XL tablet Take 1 tablet by mouth once daily clopidogreL (PLAVIX) 75 mg tablet Take by mouth cyanocobalamin, vitamin B-12, 2,000 mcg Tab Take by mouth lamoTRIgine (LAMICTAL) 200 MG tablet Take by mouth nebivoloL (BYSTOLIC) 10 MG tablet Take 1 tablet by mouth once daily potassium citrate (UROCIT-K) 15 mEq ER tablet Take by mouth  No current facility-administered medications on file prior to visit.  Family History Problem Relation Age of Onset High blood pressure (Hypertension) Mother Hyperlipidemia (Elevated cholesterol) Mother Diabetes Mother Deep vein thrombosis (DVT or abnormal blood clot formation) Mother High blood pressure (Hypertension) Father Hyperlipidemia (Elevated cholesterol) Father Diabetes Father Stroke Sister Obesity Sister High blood pressure (Hypertension) Sister Hyperlipidemia (Elevated cholesterol) Sister Diabetes Sister   Social History  Tobacco Use Smoking Status Every  Day Packs/day: 1 Types: Cigarettes Smokeless Tobacco Never   Social History  Socioeconomic History Marital status: Single Tobacco Use Smoking status:  Every Day Packs/day: 1 Types: Cigarettes Smokeless tobacco: Never Vaping Use Vaping Use: Never used Substance and Sexual Activity Alcohol use: Not Currently Drug use: Never  ############################################################  Review of Systems: A complete review of systems (ROS) was obtained from the patient. We have reviewed this information and discussed as appropriate with the patient. See HPI as well for other pertinent ROS.  Constitutional: No fevers, chills, sweats. Weight stable Eyes: No vision changes, No discharge HENT: No sore throats, nasal drainage Lymph: No neck swelling, No bruising easily Pulmonary: No cough, productive sputum CV: No orthopnea, PND . No exertional chest/neck/shoulder/arm pain. Patient can walk 1/4 mile gradually.  GI: SEE HPI. No personal nor family history of GI/colon cancer, irritable bowel syndrome, allergy such as Celiac Sprue, dietary/dairy problems, colitis, ulcers nor gastritis. No recent sick contacts/gastroenteritis. No travel outside the country. No changes in diet.  Renal: No UTIs, No hematuria Genital: No drainage, bleeding, masses Musculoskeletal: No severe joint pain. Good ROM major joints Skin: No sores or lesions Heme/Lymph: No easy bleeding. No swollen lymph nodes Neuro: No active seizures. No facial droop Psych: No hallucinations. No agitation  OBJECTIVE  Vitals: 04/17/22 0842 BP: (!) 140/80 Pulse: 69 Temp: 36.4 C (97.6 F) SpO2: 96% Weight: 99.3 kg (219 lb) Height: 152.4 cm (5')  Body mass index is 42.77 kg/m.  PHYSICAL EXAM:  Constitutional: Not cachectic. Hygeine adequate. Vitals signs as above. Eyes: No glasses. Vision adequate,Pupils reactive, normal extraocular movements. Sclera nonicteric Neuro: CN II-XII intact. No major focal sensory  defects. No major motor deficits. Lymph: No head/neck/groin lymphadenopathy Psych: No severe agitation. No severe anxiety. Judgment & insight Adequate, Oriented x4, HENT: Normocephalic, Mucus membranes moist. No thrush. Hearing: adequate Neck: Supple, No tracheal deviation. No obvious thyromegaly Chest: No pain to chest wall compression. Good respiratory excursion. No audible wheezing CV: Pulses intact. regular. No major extremity edema Ext: No obvious deformity or contracture. Edema: Not present. No cyanosis Skin: No major subcutaneous nodules. Warm and dry Musculoskeletal: Severe joint rigidity not present. No obvious clubbing. No digital petechiae. Mobility: no assist device moving easily without restrictions  Abdomen: Obese with panniculus Soft. Nondistended. Nontender. Right paramedian and midline vertical old chronic scars. Hernia: Not present. Diastasis recti: Not present. No hepatomegaly. No splenomegaly.  Genital/Pelvic: Inguinal hernia: Not present. Inguinal lymph nodes: without lymphadenopathy nor hidradenitis.  Rectal:  ##################################  Perianal skin Clean with good hygiene Pruritis ani: Mild Pilonidal disease: Not present Condyloma / warts: Not present  Anal fissure: Not present Perirectal abscess/fistula right lateral perirectal region 3 cm from anal verge is a 2 x 2 centimeter cluster of nodularity and fistulous. Some mild fullness left lateral as well. External hemorrhoids Not present  Digital and anoscopic rectal exam very sensitive. Anal stenosis 2 cm from anal verge limits digital and anoscopic exam. Sphincter tone Mildly decreased squeeze  Patient examined with assistance of female Medical Assistant in the room with patient in decubitus position . ###################################    ###################################################################  Labs, Imaging and Diagnostic Testing:  Located in 'Care Everywhere' section of Epic  EMR chart  PRIOR [facility] CLINIC NOTES:  Not applicable  SURGERY NOTES:  Located in [place]' section of Epic EMR chart  PATHOLOGY:  Located in [place]' section of Epic EMR chart  Assessment and Plan: DIAGNOSES:  Diagnoses and all orders for this visit:  Crohn's disease of anal canal (CMS-HCC)  Tobacco abuse  Peripheral arterial disease (CMS-HCC)  Chronic anticoagulation  Acquired anal stenosis  Intersphincteric fistula  ASSESSMENT/PLAN  Obese smoking female with Crohn's disease now with perirectal abscess and evidence of intersphincteric fistulae by MRI.  She needs her Crohn's disease under better control. I agree with gastroenterology that she is going to need some type of biologic agent to calm this down.  I think she require anorectal examination under esthesia. Most likely will need seton placement through the fistula tract. Possible multiple. Allow these fistulous to drain and not get infected again. Make the immunosuppressive regimen more successful and avoid severe infection. I think it was a challenge for her to understand this process. I did note Crohn's disease can attack many levels of the digestive tract and its treatment can be complicated. I think in the end she was convinced to trust my recommendations. We will see  The anatomy & physiology of the anorectal region was discussed. We discussed the pathophysiology of anorectal abscess and fistula. Differential diagnosis was discussed. Natural history progression was discussed. I stressed the importance of a bowel regimen to have daily soft bowel movements to minimize progression of disease.  The patient's condition is not adequately controlled. Non-operative treatment has not healed the fistula. Therefore, I recommended examination under anaesthesia to confirm the diagnosis and treat the fistula. I discussed techniques that may be required such as fistulotomy, ligation by LIFT technique, and/or  seton placement. Benefits & alternatives discussed. I noted a good likelihood this will help address the problem, but sometimes repeat operations and prolonged healing times may occur. Risks such as bleeding, pain, recurrence, reoperation, injury to other organs, need for repair of tissues / organs reoperation, incontinence, heart attack, death, and other risks were discussed.  Educational handouts further explaining the pathology, treatment options, and bowel regimen were given. The patient expressed understanding & wishes to proceed. We will work to coordinate surgery for a mutually convenient time.  She recently had angioplasty to help save her right lower extremity. She has been on Plavix.  Clear to hold perioperatively and then resume.   I very strongly recommend she quit smoking as that will make her Crohn's disease worse and is definitely threatening her limbs requiring angioplasty with her peripheral disease. STOP SMOKING! We talked to the patient about the dangers of smoking. We stressed that tobacco use dramatically increases the risk of peri-operative complications such as infection, tissue necrosis leaving to problems with incision/wound and organ healing, hernia, chronic pain, heart attack, stroke, DVT, pulmonary embolism, and death. We noted there are programs in our community to help stop smoking. Information was available.   Adin Hector, MD, FACS, MASCRS Esophageal, Gastrointestinal & Colorectal Surgery Robotic and Minimally Invasive Surgery  [facility] [address], [place], [place] [address] [phone] Fax [phone] Main  CONTACT INFORMATION:  Weekday (9AM-5PM): Call [facility] main office at [phone]  Weeknight (5PM-9AM) or Weekend/Holiday: Check [email or web address] (password " [email or web address]") for General Surgery [facility] coverage  (Please, do not use SecureChat as it is not reliable communication to reach operating  surgeons for immediate patient care given surgeries/outpatient duties/clinic/cross-coverage/off post-call which would lead to a delay in care.  Epic staff messaging available for outptient concerns, but may not be answered for 48 hours or more).

## 2022-05-18 NOTE — Discharge Instructions (Addendum)
##############################################  ANORECTAL SURGERY:  POST OPERATIVE INSTRUCTIONS  ######################################################################  EAT Start with a pureed / full liquid diet After 24 hours, gradually transition to a high fiber diet.    CONTROL PAIN Control pain so you can tolerate bowel movements,  walk, sleep, tolerate sneezing/coughing, and go up/down stairs.   HAVE A BOWEL MOVEMENT DAILY Keep your bowels regular to avoid problems.   Taking a fiber supplement every day to keep bowels soft.   Try a laxative to override constipation. Use an antidairrheal to slow down diarrhea.   Call if not better after 2 tries  WALK Walk an hour a day.  Control your pain to do that.   CALL IF YOU HAVE PROBLEMS/CONCERNS Call if you are still struggling despite following these instructions. Call if you have concerns not answered by these instructions  ######################################################################    Take your usually prescribed home medications unless otherwise directed.  DIET: Follow a light bland diet & liquids the first 24 hours after arrival home, such as soup, liquids, starches, etc.  Be sure to drink plenty of fluids.  Quickly advance to a usual solid diet within a few days.  Avoid fast food or heavy meals as your are more likely to get nauseated or have irregular bowels.  A low-fat, high-fiber diet for the rest of your life is ideal.  PAIN CONTROL: Expect swelling and discomfort in the anus/rectal area. Pain is best controlled by a usual combination of many methods TOGETHER: Warm baths/soaks or Ice packs Over the counter pain medication Prescription pain medications Topical creams    Warm water baths or ice packs (30-60 minutes up to 8 times a day, especially after bowel meovements) will help. Use ice for the first few days to help decrease swelling and bruising, then switch to heat such as warm towels, sitz baths, warm  baths, warm showers, etc to help relax tight/sore spots and speed recovery.  Some people prefer to use ice alone, heat alone, alternating between ice & heat.  Experiment to what works for you.    It is helpful to take an over-the-counter pain medication continuously for the first few weeks.  Choose one of the following that works best for you: Naproxen (Aleve, etc)  Two 252m tabs twice a day Ibuprofen (Advil, etc) Three 2070mtabs four times a day (every meal & bedtime) Acetaminophen (Tylenol, etc) 500-65044mour times a day (every meal & bedtime)  A  prescription for pain medication (such as oxycodone, hydrocodone, etc) should be given to you upon discharge.  Take your pain medication as prescribed.  If you are having problems/concerns with the prescription medicine (does not control pain, nausea, vomiting, rash, itching, etc), please call us [phone] see if we need to switch you to a different pain medicine that will work better for you and/or control your side effect better. If you need a refill on your pain medication, please contact your pharmacy.  They will contact our office to request authorization. Prescriptions will not be filled after 5 pm or on week-ends.  If can take up to 48 hours for it to be filled & ready so avoid waiting until you are down to thel ast pill.  A topical cream (Dibucaine) or a prescription for a cream (such as diltiazem 2% gel) may be given to you.  Many people find relief with topical creams.  Some people find it burns too much.  Experiment.  If it helps, use it.  If it burns, don't  using it.  You also may receive a prescription for diazepam, a muscle relaxant to help you to be able to urinate and defecate more easily.  It is safe to take a few doses with the other medications as long as you are not planning to drive or do anything intense.  Hopefully this can minimize the chance of needing a Foley catheter into your bladder     KEEP YOUR BOWELS  REGULAR The goal is one soft bowel movement a day Avoid getting constipated.  Between the surgery and the pain medications, it is common to experience some constipation.  Increasing fluid intake and taking a fiber supplement (such as Metamucil, Citrucel, FiberCon, MiraLax, etc) 2-4 times a day regularly will usually help prevent this problem from occurring.  A mild laxative (prune juice, Milk of Magnesia, MiraLax, etc) should be taken according to package directions if there are no bowel movements after 48 hours. Watch out for diarrhea.  If you have many loose bowel movements, simplify your diet to bland foods & liquids for a few days.  Stop any stool softeners and decrease your fiber supplement.  Switching to mild anti-diarrheal medications (Kayopectate, Pepto Bismol) can help.  Can try an imodium/loperamide dose.  If this worsens or does not improve, please call us.  Wound Care   a. You have some fluffed gauze on top of the anus to help catch drainage and bleeding.  THERE IS NO PACKING INSIDE THE RECTUM -Let the gauze fall off with the first bowel movement or shower.  It is okay to reinforce or replace as needed.  Bleeding is common at first and occasionally tapers off   b. Place soft cotton balls on the anus/wounds and use an absorbent pad in your underwear as needed to catch any drainage and help keep the area.  Try to use cotton balls or pads over regular gauze or toilet paper as gauze will stick and pull, causing pain.  Cotton will come off more easily.   c. Keep the area clean and dry.  Bathe / shower every day.  Keep the area clean by showering / bathing over the incision / wound.   It is okay to soak an open wound to help wash it.  Consider using a squeeze bottle filled with warm water to gently wash the anal area.  Wet wipes or showers / gentle washing after bowel movements is often less traumatic than regular toilet paper.   Use a Sitz Bath 4-8 times a day for relief  A sitz bath is a  warm water bath taken in the sitting position that covers only the hips and buttocks. It may be used for either healing or hygiene purposes. Sitz baths are also used to relieve pain, itching, or muscle spasms.  Gently cleaned the area and the heat will help lower spasm and offer better pain control.    Fill the bathtub half full with warm water. Sit in the water and open the drain a little. Turn on the warm water to keep the tub half full. Keep the water running constantly. Soak in the water for 15 to 20 minutes. After the sitz bath, pat the affected area dry first.   d. You will often notice bleeding, especially with bowel movements.  This should slow down by the end of the first week of surgery.  Sitting on an ice pack can help.   e. Expect some drainage.  You often will have some blood or yellow drainage with open  wounds.  Sometimes she will get a little leaking of liquid stool until the incision/wounds have fully close down.  This should slow down by the end of the first week of surgery, but you will have occasional bleeding or drainage up to a few months after surgery.  Wear an absorbent pad or soft cotton gauze in your underwear until the drainage stops.  ACTIVITIES as tolerated:    You may resume regular (light) daily activities beginning the next day--such as daily self-care, walking, climbing stairs--gradually increasing activities as tolerated.  If you can walk 30 minutes without difficulty, it is safe to try more intense activity such as jogging, treadmill, bicycling, low-impact aerobics, swimming, etc. Save the most intensive and strenuous activity for last such as sit-ups, heavy lifting, contact sports, etc  Refrain from any heavy lifting or straining until you are off narcotics for pain control.   DO NOT PUSH THROUGH PAIN.  Let pain be your guide: If it hurts to do something, don't do it.  Pain is your body warning you to avoid that activity for another week until the pain goes  down. You may drive when you are no longer taking prescription pain medication, you can comfortably sit for long periods of time, and you can safely maneuver your car and apply brakes. You may have sexual intercourse when it is comfortable.   FOLLOW UP in our office Please call [facility] at [phone] to set up an appointment to see your surgeon in the office for a follow-up appointment approximately 3 weeks after your surgery. Make sure that you call for this appointment the day you arrive home to ensure a convenient appointment time.  8. IF YOU HAVE DISABILITY OR FAMILY LEAVE FORMS, BRING THEM TO THE OFFICE FOR PROCESSING.  DO NOT GIVE THEM TO YOUR DOCTOR.        WHEN TO CALL us [phone]: Poor pain control Reactions / problems with new medications (rash/itching, nausea, etc)  Fever over 101.5 F (38.5 C) Inability to urinate Nausea and/or vomiting Worsening swelling or bruising Continued bleeding from incision. Increased pain, redness, or drainage from the incision  The clinic staff is available to answer your questions during regular business hours (8:30am-5pm).  Please don't hesitate to call and ask to speak to one of our nurses for clinical concerns.   A surgeon from [facility] is always on call at the hospitals   If you have a medical emergency, go to the nearest emergency room or call 911.    [facility], [place], [place], [place], [place]  [address] ? MAIN: [phone] ? TOLL FREE: [phone] ? FAX [phone] [email or web address]  #####################################################       No acetaminophen/Tylenol until after 12:00 pm today if needed.   Post Anesthesia Home Care Instructions  Activity: Get plenty of rest for the remainder of the day. A responsible individual must stay with you for 24 hours following the procedure.  For the next 24 hours, DO NOT: -Drive a car -Conservation officer, nature -Drink alcoholic beverages -Take any medication unless instructed by your physician -Make any legal decisions or sign important papers.  Meals: Start with liquid foods such as gelatin or soup. Progress to regular foods as tolerated. Avoid greasy, spicy, heavy foods. If nausea and/or vomiting occur, drink only clear liquids until the nausea and/or vomiting subsides. Call your physician if vomiting continues.  Special Instructions/Symptoms: Your throat may feel dry or sore from the anesthesia or  the breathing tube placed in your throat during surgery. If this causes discomfort, gargle with warm salt water. The discomfort should disappear within 24 hours.     Information for Discharge Teaching: EXPAREL (bupivacaine liposome injectable suspension)   Your surgeon or anesthesiologist gave you EXPAREL(bupivacaine) to help control your pain after surgery.  EXPAREL is a local anesthetic that provides pain relief by numbing the tissue around the surgical site. EXPAREL is designed to release pain medication over time and can control pain for up to 72 hours. Depending on how you respond to EXPAREL, you may require less pain medication during your recovery.  Possible side effects: Temporary loss of sensation or ability to move in the area where bupivacaine was injected. Nausea, vomiting, constipation Rarely, numbness and tingling in your mouth or lips, lightheadedness, or anxiety may occur. Call your doctor right away if you think you may be experiencing any of these sensations, or if you have other questions regarding possible side effects.  Follow all other discharge instructions given to you by your surgeon or nurse. Eat a healthy diet and drink plenty of water or other fluids.  If you return to the hospital for any reason within 96 hours following the administration of EXPAREL, it is important for health care providers to know that you have received this anesthetic. A teal colored band  has been placed on your arm with the date, time and amount of EXPAREL you have received in order to alert and inform your health care providers. Please leave this armband in place for the full 96 hours following administration, and then you may remove the band.

## 2022-05-18 NOTE — Op Note (Signed)
05/18/2022  8:47 AM  PATIENT:  April May  67 y.o. female  Patient Care Team: Janith Lima, MD as PCP - General (Internal Medicine) Charlton Haws, [facility] as Pharmacist (Pharmacist) Star Age, MD as Attending Physician (Neurology) Michael Boston, MD as Consulting Physician (Colon and Rectal Surgery) Jerline Pain, MD as Consulting Physician (Cardiology) Ladene Artist, MD as Consulting Physician (Gastroenterology) Serafina Mitchell, MD as Consulting Physician (Vascular Surgery)  PRE-OPERATIVE DIAGNOSIS:  PERIRECTAL FISTULA WITH CROHN'S DISEASE  POST-OPERATIVE DIAGNOSIS:  PERIRECTAL SINUS TRACT WITH CROHN'S DISEASE  PROCEDURE:  ANORECTAL EXAM UNDER ANESTHESIA EXCISION & LIGATION OF FISTULOUS TRACT EXCISION OF PERINEAL MASS  SURGEON:  Adin Hector, MD  ASSISTANT: OR Staff   ANESTHESIA:   General Anorectal & Local field block (0.25% bupivacaine with epinephrine mixed with Liposomal bupivacaine (Experel)    EBL:  Total I/O In: 100 [IV Piggyback:100] Out: - .  See anesthesia record  Delay start of Pharmacological VTE agent (>24hrs) due to surgical blood loss or risk of bleeding:  no  DRAINS: none   SPECIMEN:   Right posterior perianal tubular scarring suspicious for incomplete fistula tract. Anterior midline raphae subcutaneous cystic mass.  Most likely consistent with folliculitis.  DISPOSITION OF SPECIMEN:  PATHOLOGY  COUNTS:  YES  PLAN OF CARE: Discharge to home after PACU  PATIENT DISPOSITION:  PACU - hemodynamically stable.  INDICATION: Patient with probable perirectal fistula.  History of known Crohn disease followed by [facility] gastroenterology.  I recommended examination and surgical treatment:  The anatomy & physiology of the anorectal region was discussed.  We discussed the pathophysiology of anorectal abscess and fistula.  Differential diagnosis was discussed.  Natural history progression was discussed.   I stressed the importance  of a bowel regimen to have daily soft bowel movements to minimize progression of disease.     The patient's condition is not adequately controlled.  Non-operative treatment has not healed the fistula.  Therefore, I recommended examination under anaesthesia to confirm the diagnosis and treat the fistula.  I discussed techniques that may be required such as fistulotomy, ligation by LIFT technique, and/or seton placement.  Benefits & alternatives discussed.  I noted a good likelihood this will help address the problem, but sometimes repeat operations and prolonged healing times may occur.  Risks such as bleeding, pain, recurrence, reoperation, incontinence, heart attack, death, and other risks were discussed.      Educational handouts further explaining the pathology, treatment options, and bowel regimen were given.  The patient expressed understanding & wishes to proceed.  We will work to coordinate surgery for a mutually convenient time.   OR FINDINGS:  Patient had chronic scarring in the right posterior perirectal region but noted evidence of any persistent fistula.  No evidence of any internal opening.  Excision of chronic tubular scar with high ligation to ask for external finger complex done with layered pursestring closure.    No strong evidence of Crohn's proctitis or active fistula disease at this time.  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  Grade 2 internal hemorrhoids without any major inflammation.  Held off on ligation/pexy/excision given history of Crohn's disease.  Anal canal with minimal narrowing and no stricture.  Small dermal wound in anterior midline raphae.  Most likely consistent with some folliculitis.  Excision done as well.     DESCRIPTION:   Informed consent was confirmed. Patient underwent general anesthesia without difficulty. Patient was placed into lithotomy  positioning.  The perianal region was prepped and draped in  sterile fashion. Surgical timeout confirmed or plan.  I did digital rectal examination and then transitioned over to anoscopy to get a sense of the anatomy.  Findings as noted above.  There are only 2 perirectal/perineal suspicious small wounds.  Most dominant 1 was right posterior about 3 cm from the anal verge.  On digital examination I can milk 1 tiny dot of purulence.  However it seemed to be closed.  Tried to probe and injected but it was too narrow and scarred down.  Anorectal examination under anesthesia revealed the anal canal was mildly narrowed.  There is no active Crohn's proctitis.  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  There is no evidence of any fistulous openings in the anal canal at the anal crypts or more proximally.  We pulled up and reviewed the MRI of the pelvis that noted scarring suspicious in the right perirectal region.  .  Because I could palpate a cord from the right posterior perirectal wound going towards the sphincter complex I decided to see if I could excise this and reevaluate more deeply.  I started externally to excise the external opening with a radial biconcave incision around it.  I gradually dissected around the chronic fistulous tract and headed proximally towards the sphincter component.  It was very thinned out a narrow 1-2 mm tract.  Tried to do a partial ductotomy to see if I could probe through the tract more proximally but it was closed.  It did not seem to involve the sphincter complex.  Proceed with high ligation of the sinus tract.  Placed a right angle clamp at the base external to the sphincter complex.  Transected the fistulous track for pathology.  Did suture ligation of that tract.  Was able to pull up and dissected and the little more proximally and did a direct ligation around the tract as well.    In the anterior midline raphae about 2 cm from the anal verge was a small cluster of draining wound 3 mm.  Seem more  like folliculitis.  I came around to excise it and it was closed like a folliculitis cyst and no tract there.  Again diligently explored the rest of the perirectal region including the left side and I could palpate no no cord nor see any scarring or draining or sinus or fistulous tract.  Did meticulous reinspection of the anorectal canal to confirm no evidence of any internal opening.  Hemorrhoids were mildly enlarged but nothing too severe.  Because she is a Crohn's patient I did not want to be overly aggressive on any surgical treatment in that area.  I closed the right posterior perirectal wound with 2-0 vicryl pursestring suture and in the deeper ischiorectal space and then more at the dermis.  Excised excess skin/dermis to have a flat open 25 x 15 mm radial ellipsoid wound to hopefully close down easily.  Anterior midline raphae wound 10 x 10 mm -very superficial.  No setons were placed since there is no evidence of any inter/transsphincteric fistula.  No internal opening to seton.  The rectum did not seem particularly inflamed so I did not do any rectal biopsies.  Hemostasis was good.   We placed fluff cotton to onlay over the wounds.  No packing done.    I discussed operative plans and postop recovery her condition of the patient's in the holding area.  Instructions written and  prescription sent.  I discussed operative findings, updated the patient's status, discussed probable steps to recovery, and gave postoperative recommendations to the patient's daughter, Roderic Ovens .  Recommendations were made.  Questions were answered.  She expressed understanding & appreciation.  Adin Hector, M.D., F.A.C.S. Gastrointestinal and Minimally Invasive Surgery [facility] [address], [place], [place] [address] [phone] Main / Paging

## 2022-05-18 NOTE — Interval H&P Note (Signed)
History and Physical Interval Note:  05/18/2022 7:34 AM  April May  has presented today for surgery, with the diagnosis of PERIRECTAL FISTULA WITH CROHN'S DISEASE.  The various methods of treatment have been discussed with the patient and family. After consideration of risks, benefits and other options for treatment, the patient has consented to  Procedure(s): ANORECTAL EXAM UNDER ANESTHESIA (N/A) PLACEMENT OF SETON (N/A) as a surgical intervention.  The patient's history has been reviewed, patient examined, no change in status, stable for surgery.  I have reviewed the patient's chart and labs.  Questions were answered to the patient's satisfaction.    I have re-reviewed the the patient's records, history, medications, and allergies.  I have re-examined the patient.  I again discussed intraoperative plans and goals of post-operative recovery.  The patient agrees to proceed.  [name]  [birth year] [id number]  Patient Care Team: Janith Lima, MD as PCP - General (Internal Medicine) Charlton Haws, [facility] as Pharmacist (Pharmacist) Star Age, MD as Attending Physician (Neurology) Michael Boston, MD as Consulting Physician (Colon and Rectal Surgery) Jerline Pain, MD as Consulting Physician (Cardiology) Ladene Artist, MD as Consulting Physician (Gastroenterology) Serafina Mitchell, MD as Consulting Physician (Vascular Surgery)  Patient Active Problem List   Diagnosis Date Noted   Prediabetes 04/24/2022   Hirsutism 10/30/2021   Need for shingles vaccine 06/24/2021   Hyperlipidemia LDL goal <130 06/24/2021   Vitamin D deficiency 06/24/2021   Current moderate episode of major depressive disorder ([place]) 06/24/2021   Tobacco abuse counseling 06/21/2021   Recurrent major depressive disorder, in full remission ([place]) 06/21/2021   Vitamin B12 deficiency 06/21/2021   Statin myopathy 09/22/2020   Encounter for general adult medical examination with abnormal  findings 06/23/2020   DDD (degenerative disc disease), lumbar 06/26/2018   Abnormal electrocardiogram (ECG) (EKG) 10/08/2017   DDD (degenerative disc disease), cervical 04/27/2017   GAD (generalized anxiety disorder) 12/03/2016   Aortic ejection murmur 03/25/2016   Crohn's disease of both small and large intestine with fistula ([place]) 11/24/2014   COPD with chronic bronchitis ([place]) 03/11/2014   Diuretic-induced hypokalemia 01/22/2013   Sleep apnea 04/27/2011   Obesity, Class II, BMI 35-39.9, with comorbidity 04/01/2009   TOBACCO ABUSE 02/24/2008   ALLERGIC RHINITIS 02/24/2008   Malignant hypertension 08/04/2007    Past Medical History:  Diagnosis Date   Abrasion 05/10/2022   right foot   Anal fistula    Anticoagulated    plavix and asa--- managed by vascular, dr Trula Slade   Arthritis    B12 deficiency    CKD (chronic kidney disease), stage I    [place] [facility] --- samantha collins PA (lov in epic/ media tab 02-23-2022)   COPD (chronic obstructive pulmonary disease) ([place])    Coronary artery calcification seen on CAT scan    cardiologist--- dr Marlou Porch;    nuclear stress test 10-29-2017  normal perfusion , nuclear ef 60%;   echo 10-16-2017  ef 55-60%, mild TR   Crohn's disease of both small and large intestine ([place])    followed by dr stark  (GI   Full dentures    GAD (generalized anxiety disorder)    History of acute pancreatitis    secondary to azathioprine   History of small bowel obstruction 11/24/2014   admission in epic  for crohn's flare w/ sbo and abscess s/p  surgical intervention   History of squamous cell carcinoma of skin 06/11/2017   excision left arm   Hyperlipidemia, mixed  followed by cardiology and pcp;   statin intolerate   MDD (major depressive disorder)    OSA on CPAP    followed by dr Dia Sitter ;  (05-16-2022  per pt uses nightly)   study in epic 01-05-2020  AHI 15.4/hr;  does not use cpap since weight loss 1 and 1/2 years ago   Perianal lesion    Peripheral  vascular disease ([place]) 02/2022   vascular--- dr Trula Slade;   03-20-2022  s/p  balloon angioplasty right occluded peroneal artery, right anterior tibial artery, right tibioperoneal truck   PONV (postoperative nausea and vomiting)    Pre-diabetes    Resistant hypertension    followed by pcp and cardiology   Swelling of right foot 05/10/2022   Vitamin D deficiency     Past Surgical History:  Procedure Laterality Date   ABDOMINAL AORTOGRAM W/LOWER EXTREMITY N/A 03/20/2022   Procedure: ABDOMINAL AORTOGRAM W/LOWER EXTREMITY;  Surgeon: Serafina Mitchell, MD;  Location: [facility];  Service: Cardiovascular;  Laterality: N/A;   ABDOMINAL HYSTERECTOMY     age 60;  per pt thinks ovaries were removed   APPENDECTOMY     teen   APPLICATION OF WOUND VAC  11/24/2014   Procedure: APPLICATION OF WOUND VAC;  Surgeon: Donnie Mesa, MD;  Location: [facility];  Service: General;;   BOWEL RESECTION N/A 11/24/2014   Procedure: SMALL BOWEL RESECTION;  Surgeon: Donnie Mesa, MD;  Location: [facility];  Service: General;  Laterality: N/A;   BREAST EXCISIONAL BIOPSY Right 1972   benign   LAPAROTOMY N/A 11/24/2014   Procedure: EXPLORATORY LAPAROTOMY EXTENSIVE LYSIS OF ADHESIONS SAMLL BOWEL RESECTION RIGHT HEMI COLECTOMY WOUND VAC APPLICATION. ;  Surgeon: Donnie Mesa, MD;  Location: [facility];  Service: General;  Laterality: N/A;   LYSIS OF ADHESION  11/24/2014   Procedure: LYSIS OF ADHESION (3 HRS);  Surgeon: Donnie Mesa, MD;  Location: [facility];  Service: General;;   PARTIAL COLECTOMY  11/24/2014   Procedure: RIGHT HEMI COLECTOMY;  Surgeon: Donnie Mesa, MD;  Location: [facility];  Service: General;;   PERIPHERAL VASCULAR BALLOON ANGIOPLASTY  03/20/2022   Procedure: PERIPHERAL VASCULAR BALLOON ANGIOPLASTY;  Surgeon: Serafina Mitchell, MD;  Location: [facility];  Service: Cardiovascular;;  AT and Peroneal   SMALL INTESTINE SURGERY     age 62  (for crohn's disease)   TONSILLECTOMY     teen    Social  History   Socioeconomic History   Marital status: Divorced    Spouse name: Not on file   Number of children: 2   Years of education: Not on file   Highest education level: 12th grade  Occupational History   Occupation: disabled  Tobacco Use   Smoking status: Every Day    Packs/day: 0.75    Years: 48.00    Total pack years: 36.00    Types: Cigarettes   Smokeless tobacco: Never   Tobacco comments:    05-16-2022  per pt started smoking as a teen and trying to quit  Vaping Use   Vaping Use: Never used  Substance and Sexual Activity   Alcohol use: No   Drug use: Never   Sexual activity: Not on file  Other Topics Concern   Not on file  Social History Narrative   Not on file   Social Determinants of Health   Financial Resource Strain: Low Risk  (11/24/2021)   Overall Financial Resource Strain (CARDIA)    Difficulty of Paying Living Expenses: Not hard at  all  Food Insecurity: No Food Insecurity (11/24/2021)   Hunger Vital Sign    Worried About Running Out of Food in the Last Year: Never true    Ran Out of Food in the Last Year: Never true  Transportation Needs: No Transportation Needs (11/24/2021)   PRAPARE - Hydrologist (Medical): No    Lack of Transportation (Non-Medical): No  Physical Activity: Sufficiently Active (11/24/2021)   Exercise Vital Sign    Days of Exercise per Week: 5 days    Minutes of Exercise per Session: 30 min  Stress: No Stress Concern Present (11/24/2021)   [place]    Feeling of Stress : Not at all  Social Connections: Moderately Integrated (11/24/2021)   Social Connection and Isolation Panel [NHANES]    Frequency of Communication with Friends and Family: More than three times a week    Frequency of Social Gatherings with Friends and Family: Once a week    Attends Religious Services: More than 4 times per year    Active Member of [employer] or Organizations: No     Attends Music therapist: More than 4 times per year    Marital Status: Divorced  Human resources officer Violence: Not At Risk (11/22/2020)   Humiliation, Afraid, Rape, and Kick questionnaire    Fear of Current or Ex-Partner: No    Emotionally Abused: No    Physically Abused: No    Sexually Abused: No    Family History  Problem Relation Age of Onset   Hypertension Mother    Ulcerative colitis Mother    Crohn's disease Mother    Heart disease Mother    Kidney disease Mother    Arthritis Father    Brain cancer Father    Diabetes Father    Alcohol abuse Paternal Uncle    Diabetes Paternal Grandmother    Stroke Paternal Grandmother    Heart failure Sister    Kidney disease Sister    Heart failure Brother    Other Sister        prediabetes   Kidney cancer Brother     Medications Prior to Admission  Medication Sig Dispense Refill Last Dose   amLODipine (NORVASC) 10 MG tablet TAKE 1 TABLET BY MOUTH DAILY (Patient taking differently: Take 10 mg by mouth daily.) 100 tablet 0 05/18/2022 at 0430   aspirin EC 81 MG tablet Take 1 tablet (81 mg total) by mouth daily. Swallow whole. 150 tablet 2 05/17/2022   budesonide (ENTOCORT EC) 3 MG 24 hr capsule TAKE 3 CAPSULES (9 MG TOTAL) BY MOUTH DAILY. (Patient taking differently: Take 9 mg by mouth daily.) 270 capsule 0 05/17/2022   buPROPion (WELLBUTRIN XL) 150 MG 24 hr tablet TAKE 1 TABLET BY MOUTH DAILY (Patient taking differently: Take 150 mg by mouth daily.) 100 tablet 0 05/17/2022   Cholecalciferol (VITAMIN D3) 1.25 MG (50000 UT) CAPS TAKE 1 CAPSULE BY MOUTH ONE TIME PER WEEK (Patient taking differently: Take 1 capsule by mouth once a week. Saturday's) 12 capsule 0 Past Week   [employer] PAIN RELIEF EXTRA STRENGTH 500 MG tablet TAKE 1 TABLET BY MOUTH EVERY 8 HOURS AS NEEDED FOR MODERATE PAIN (Patient taking differently: Take 500-1,000 mg by mouth every 8 (eight) hours as needed for moderate pain.) 60 tablet 5 Past Week   Cyanocobalamin (B-12  COMPLIANCE INJECTION) 1000 MCG/ML KIT Inject as directed every 30 (thirty) days.   05/17/2022   ezetimibe (ZETIA) 10  MG tablet TAKE 1 TABLET BY MOUTH DAILY 60 tablet 5 05/17/2022   lamoTRIgine (LAMICTAL) 200 MG tablet Take 1 tablet (200 mg total) by mouth at bedtime. 100 tablet 1 05/17/2022   nebivolol (BYSTOLIC) 10 MG tablet TAKE 1 TABLET BY MOUTH DAILY (Patient taking differently: Take 10 mg by mouth daily.) 100 tablet 0 05/18/2022 at 0430   neomycin-bacitracin-polymyxin [phone] OINT Apply 1 Application topically daily. Right foot abrasion      nitroGLYCERIN (NITRO-BID) 2 % ointment Apply 0.5 inches topically 3 (three) times daily. (Patient taking differently: Apply 0.5 inches topically 3 (three) times daily. Per pt applies to right great toe) 30 g 1    Potassium Citrate 15 MEQ (1620 MG) TBCR TAKE 1 TABLET BY MOUTH IN THE  MORNING AT NOON, AND AT BEDTIME (Patient taking differently: Take 1 tablet by mouth in the morning, at noon, and at bedtime.) 300 tablet 0 05/17/2022   spironolactone (ALDACTONE) 100 MG tablet TAKE 1 TABLET BY MOUTH DAILY (Patient taking differently: Take 100 mg by mouth daily.) 100 tablet 1 05/17/2022   albuterol (PROAIR HFA) 108 (90 Base) MCG/ACT inhaler TAKE 2 PUFFS BY MOUTH EVERY 6 HOURS AS NEEDED FOR WHEEZE OR SHORTNESS OF BREATH (Patient taking differently: Inhale 2 puffs into the lungs every 6 (six) hours as needed for shortness of breath or wheezing. TAKE 2 PUFFS BY MOUTH EVERY 6 HOURS AS NEEDED FOR WHEEZE OR SHORTNESS OF BREATH) 18 g 5 More than a month   clonazePAM (KLONOPIN) 0.5 MG tablet Take 1 tablet (0.5 mg total) by mouth 2 (two) times daily as needed for anxiety. (Patient taking differently: Take 0.5 mg by mouth 2 (two) times daily as needed for anxiety.) 60 tablet 3 More than a month   clopidogrel (PLAVIX) 75 MG tablet Take 1 tablet (75 mg total) by mouth daily. 30 tablet 11 05/13/2022   fluticasone (FLONASE) 50 MCG/ACT nasal spray Place 2 sprays into both nostrils daily.  (Patient taking differently: Place 2 sprays into both nostrils daily as needed for allergies.) 48 g 3 More than a month   STIOLTO RESPIMAT 2.5-2.5 MCG/ACT AERS USE 2 INHALATIONS BY MOUTH  DAILY (Patient taking differently: Inhale 2 each into the lungs daily as needed.) 12 g 1 More than a month    Current Facility-Administered Medications  Medication Dose Route Frequency Provider Last Rate Last Admin   0.9 %  sodium chloride infusion   Intravenous Continuous Lynda Rainwater, MD 50 mL/hr at 05/18/22 0623 New Bag at 05/18/22 0623   bupivacaine liposome (EXPAREL) 1.3 % injection 266 mg  20 mL Infiltration Once Michael Boston, MD       cefTRIAXone (ROCEPHIN) 2 g in sodium chloride 0.9 % 100 mL IVPB  2 g Intravenous On Call to OR Michael Boston, MD       Chlorhexidine Gluconate Cloth 2 % PADS 6 each  6 each Topical Once Michael Boston, MD       And   Chlorhexidine Gluconate Cloth 2 % PADS 6 each  6 each Topical Once Michael Boston, MD       Derrill Memo ON 05/19/2022] feeding supplement (ENSURE PRE-SURGERY) liquid 296 mL  296 mL Oral Once Michael Boston, MD       lactated ringers infusion   Intravenous Continuous Lynda Rainwater, MD         Allergies  Allergen Reactions   Azathioprine Other (See Comments)    Side effects to pancrease      Crestor [Rosuvastatin Calcium] Other (  See Comments)    Muscle and joint aches   Lipitor [Atorvastatin Calcium] Other (See Comments)    Muscle cramps   Semaglutide Other (See Comments)    Constipation    Nsaids Other (See Comments)    Crohn's disease = NO  NSIADs   Lisinopril     Uncontrollable head shaking     BP (!) 143/76   Pulse (!) 51   Temp 98.9 F (37.2 C) (Oral)   Resp 17   Ht '5\' 1"'$  (1.549 m)   Wt 100.2 kg   LMP 05/14/1988   SpO2 98%   BMI 41.76 kg/m   Labs: Results for orders placed or performed during the hospital encounter of 05/18/22 (from the past 48 hour(s))  I-STAT, chem 8     Status: Abnormal   Collection Time: 05/18/22  6:12 AM   Result Value Ref Range   Sodium 141 135 - 145 mmol/L   Potassium 4.2 3.5 - 5.1 mmol/L   Chloride 103 98 - 111 mmol/L   BUN 8 8 - 23 mg/dL   Creatinine, Ser 0.80 0.44 - 1.00 mg/dL   Glucose, Bld 102 (H) 70 - 99 mg/dL    Comment: Glucose reference range applies only to samples taken after fasting for at least 8 hours.   Calcium, Ion 1.18 1.15 - 1.40 mmol/L   TCO2 27 22 - 32 mmol/L   Hemoglobin 15.6 (H) 12.0 - 15.0 g/dL   HCT 46.0 36.0 - 46.0 %    Imaging / Studies: VAS [place] ABI WITH/WO TBI  Result Date: 04/23/2022  LOWER EXTREMITY DOPPLER STUDY Patient Name:  April May  Date of Exam:   04/23/2022 Medical Rec #: [id number]                 Accession #:    [id number] Date of Birth: 11-07-1955                 Patient Gender: F Patient Age:   79 years Exam Location:  [facility] Procedure:      VAS [place] ABI WITH/WO TBI Referring Phys: Harold Barban --------------------------------------------------------------------------------  Indications: Claudication, and peripheral artery disease. post-intervention              follow up High Risk Factors: Hypertension, Diabetes.  Vascular Interventions: 03/20/22 abdominal aortogram with RLE runoff, right                         anterior tibial artery angioplasty, right tibioperoneal                         trunk angioplasty, right peroneal artery angioplasty. Comparison Study: 03/12/22- right 0.64/0.00, left=1.19/0.61 Performing Technologist: Maudry Mayhew MHA, RVT, RDCS, RDMS  Examination Guidelines: A complete evaluation includes at minimum, Doppler waveform signals and systolic blood pressure reading at the level of bilateral brachial, anterior tibial, and posterior tibial arteries, when vessel segments are accessible. Bilateral testing is considered an integral part of a complete examination. Photoelectric Plethysmograph (PPG) waveforms and toe systolic pressure readings are included as required and additional duplex testing as  needed. Limited examinations for reoccurring indications may be performed as noted.  ABI Findings: +--------+------------------+-----+----------+--------+ Right   Rt Pressure (mmHg)IndexWaveform  Comment  +--------+------------------+-----+----------+--------+ [id number]                                       +--------+------------------+-----+----------+--------+  PTA     120               0.83 monophasic         +--------+------------------+-----+----------+--------+ DP      108               0.74 monophasic         +--------+------------------+-----+----------+--------+ +---------+------------------+-----+---------+-------+ Left     Lt Pressure (mmHg)IndexWaveform Comment +---------+------------------+-----+---------+-------+ Brachial 145                                     +---------+------------------+-----+---------+-------+ PTA      151               1.04 triphasic        +---------+------------------+-----+---------+-------+ DP       163               1.12 triphasic        +---------+------------------+-----+---------+-------+ Great Toe46                0.32                  +---------+------------------+-----+---------+-------+ +-------+-----------+-----------+------------+------------+ ABI/TBIToday's ABIToday's TBIPrevious ABIPrevious TBI +-------+-----------+-----------+------------+------------+ Right  0.83       0.00       0.64        0.00         +-------+-----------+-----------+------------+------------+ Left   1.12       0.32       1.19        0.61         +-------+-----------+-----------+------------+------------+ Right pedal acceleration time= 174m, left pedal acceleration time=652m within normal limits bilaterally.  Right ABIs appear increased compared to prior study on 03/12/2022. Left ABIs and right TBI appear essentially unchanged compared to prior study on 03/12/2022. Left TBI is decreased when compared to prior study on  03/12/2022.  Summary: Right: Resting right ankle-brachial index indicates mild right lower extremity arterial disease. Absent right great toe PPG waveform at rest. Left: Resting left ankle-brachial index is within normal range. The left toe-brachial index is abnormal. *See table(s) above for measurements and observations.  Electronically signed by VaHarold BarbanD on 04/23/2022 at 1:29:19 PM.    Final      .StAdin HectorM.D., F.A.C.S. Gastrointestinal and Minimally Invasive Surgery [facility] [id number] [phone] / Paging  05/18/2022 7:34 AM    StAdin Hector

## 2022-05-18 NOTE — Transfer of Care (Signed)
Immediate Anesthesia Transfer of Care Note  Patient: April May  Procedure(s) Performed: ANORECTAL EXAM UNDER ANESTHESIA; EXCISION OF FISTULOUS TRACT (Rectum)  Patient Location: PACU  Anesthesia Type:General  Level of Consciousness: awake and alert   Airway & Oxygen Therapy: Patient Spontanous Breathing and Patient connected to nasal cannula oxygen  Post-op Assessment: Report given to RN and Post -op Vital signs reviewed and stable  Post vital signs: Reviewed and stable  Last Vitals:  Vitals Value Taken Time  BP 127/65 05/18/22 0855  Temp    Pulse 64 05/18/22 0857  Resp 20 05/18/22 0857  SpO2 97 % 05/18/22 0857  Vitals shown include unvalidated device data.  Last Pain:  Vitals:   05/18/22 0619  TempSrc: Oral  PainSc: 0-No pain      Patients Stated Pain Goal: 5 (64/40/34 7425)  Complications: No notable events documented.

## 2022-05-18 NOTE — Anesthesia Procedure Notes (Addendum)
Procedure Name: LMA Insertion Date/Time: 05/18/2022 7:50 AM  Performed by: Mechele Claude, CRNAPre-anesthesia Checklist: Patient identified, Emergency Drugs available, Suction available and Patient being monitored Patient Re-evaluated:Patient Re-evaluated prior to induction Oxygen Delivery Method: Circle system utilized Preoxygenation: Pre-oxygenation with 100% oxygen Induction Type: IV induction Ventilation: Mask ventilation without difficulty LMA: LMA inserted LMA Size: 4.0 Number of attempts: 1 Airway Equipment and Method: Bite block Placement Confirmation: positive ETCO2 Tube secured with: Tape Dental Injury: Teeth and Oropharynx as per pre-operative assessment

## 2022-05-18 NOTE — Anesthesia Postprocedure Evaluation (Signed)
Anesthesia Post Note  Patient: April May  Procedure(s) Performed: ANORECTAL EXAM UNDER ANESTHESIA; EXCISION OF FISTULOUS TRACT (Rectum)     Patient location during evaluation: PACU Anesthesia Type: General Level of consciousness: awake and alert Pain management: pain level controlled Vital Signs Assessment: post-procedure vital signs reviewed and stable Respiratory status: spontaneous breathing, nonlabored ventilation and respiratory function stable Cardiovascular status: blood pressure returned to baseline and stable Postop Assessment: no apparent nausea or vomiting Anesthetic complications: no   No notable events documented.  Last Vitals:  Vitals:   05/18/22 0915 05/18/22 0930  BP: 128/65 123/67  Pulse: (!) 59 (!) 58  Resp: 18 17  Temp:    SpO2: 94% 91%    Last Pain:  Vitals:   05/18/22 0930  TempSrc:   PainSc: 0-No pain                 Lynda Rainwater

## 2022-05-21 NOTE — Progress Notes (Signed)
Thanks for the clarification. I recommended starting a biologic however she is reluctant, concerned about the potential side effects.

## 2022-05-22 ENCOUNTER — Other Ambulatory Visit: Payer: Self-pay | Admitting: Internal Medicine

## 2022-05-22 DIAGNOSIS — I10 Essential (primary) hypertension: Secondary | ICD-10-CM

## 2022-05-24 LAB — SURGICAL PATHOLOGY

## 2022-05-29 DIAGNOSIS — S9001XD Contusion of right ankle, subsequent encounter: Secondary | ICD-10-CM | POA: Diagnosis not present

## 2022-05-29 DIAGNOSIS — S90511D Abrasion, right ankle, subsequent encounter: Secondary | ICD-10-CM | POA: Diagnosis not present

## 2022-05-31 ENCOUNTER — Other Ambulatory Visit: Payer: Self-pay | Admitting: Internal Medicine

## 2022-05-31 DIAGNOSIS — F321 Major depressive disorder, single episode, moderate: Secondary | ICD-10-CM

## 2022-05-31 DIAGNOSIS — I10 Essential (primary) hypertension: Secondary | ICD-10-CM

## 2022-06-01 ENCOUNTER — Ambulatory Visit: Payer: Medicare Other | Admitting: Vascular Surgery

## 2022-06-14 DIAGNOSIS — G4733 Obstructive sleep apnea (adult) (pediatric): Secondary | ICD-10-CM | POA: Diagnosis not present

## 2022-06-18 ENCOUNTER — Other Ambulatory Visit: Payer: Self-pay | Admitting: Gastroenterology

## 2022-06-19 ENCOUNTER — Ambulatory Visit: Payer: Medicare Other | Admitting: Gastroenterology

## 2022-06-20 ENCOUNTER — Ambulatory Visit (INDEPENDENT_AMBULATORY_CARE_PROVIDER_SITE_OTHER): Payer: 59 | Admitting: *Deleted

## 2022-06-20 ENCOUNTER — Ambulatory Visit: Payer: 59 | Admitting: Podiatry

## 2022-06-20 DIAGNOSIS — E538 Deficiency of other specified B group vitamins: Secondary | ICD-10-CM | POA: Diagnosis not present

## 2022-06-20 MED ORDER — CYANOCOBALAMIN 1000 MCG/ML IJ SOLN
1000.0000 ug | Freq: Once | INTRAMUSCULAR | Status: AC
  Administered 2022-06-20: 1000 ug via INTRAMUSCULAR

## 2022-06-20 NOTE — Progress Notes (Signed)
Pls cosign for B12 inj../lmb  

## 2022-06-21 ENCOUNTER — Telehealth: Payer: Self-pay

## 2022-06-21 ENCOUNTER — Ambulatory Visit: Payer: 59 | Admitting: Gastroenterology

## 2022-06-21 ENCOUNTER — Encounter: Payer: Self-pay | Admitting: Gastroenterology

## 2022-06-21 VITALS — BP 136/86 | HR 53 | Ht 61.0 in | Wt 219.0 lb

## 2022-06-21 DIAGNOSIS — K50813 Crohn's disease of both small and large intestine with fistula: Secondary | ICD-10-CM | POA: Diagnosis not present

## 2022-06-21 MED ORDER — NA SULFATE-K SULFATE-MG SULF 17.5-3.13-1.6 GM/177ML PO SOLN: 1.0000 | Freq: Once | ORAL | 0 refills | Status: AC

## 2022-06-21 NOTE — Patient Instructions (Signed)
You have been scheduled for a colonoscopy. Please follow written instructions given to you at your visit today.  Please pick up your prep supplies at the pharmacy within the next 1-3 days. If you use inhalers (even only as needed), please bring them with you on the day of your procedure.  The [facility] providers would like to encourage you to use MYCHART to communicate with providers for non-urgent requests or questions.  Due to long hold times on the telephone, sending your provider a message by MYCHART may be a faster and more efficient way to get a response.  Please allow 48 business hours for a response.  Please remember that this is for non-urgent requests.   Due to recent changes in healthcare laws, you may see the results of your imaging and laboratory studies on MyChart before your provider has had a chance to review them.  We understand that in some cases there may be results that are confusing or concerning to you. Not all laboratory results come back in the same time frame and the provider may be waiting for multiple results in order to interpret others.  Please give us 48 hours in order for your provider to thoroughly review all the results before contacting the office for clarification of your results.   Thank you for choosing me and  Gastroenterology.  Malcolm T. Stark, Jr., MD., FACG  

## 2022-06-21 NOTE — Telephone Encounter (Signed)
   April May 05-21-1955 [id number]  Dear Dr. Trula Slade:  We have scheduled the above named patient for a(n) colonoscopy procedure. Our records show that (s)he is on anticoagulation therapy.  Please advise as to whether the patient may come off their therapy of Plavix 5 days prior to their procedure which is scheduled for 07/25/22.  Please route your response to [employer], CMA.  Sincerely,    [facility]

## 2022-06-21 NOTE — Progress Notes (Addendum)
    Assessment     Crohn's disease with long term ileitis and new perianal disease. S/P TI and right colon resection in 2016 PVD on Plavix   Recommendations   Schedule colonoscopy to further assess disease activity. The risks (including bleeding, perforation, infection, missed lesions, medication reactions and possible hospitalization or surgery if complications occur), benefits, and alternatives to colonoscopy with possible biopsy and possible polypectomy were discussed with the patient and they consent to proceed.   Long discussion about starting a biologic and stopping budesonide. She is reluctant and wary of biologic therapy. She wants to remain on budesonide for now.  PVD. Hold Plavix 5 days before procedure - will instruct when and how to resume after procedure. Low but real risk of cardiovascular event such as heart attack, stroke, embolism, thrombosis or ischemia/infarct of other organs off Plavix explained and need to seek urgent help if this occurs. The patient consents to proceed. Will communicate by phone or EMR with patient's prescribing provider to confirm that holding Plavix is reasonable in this case.     HPI    This is a 67 year old female Crohn's disease with long term ileitis and new perianal disease.  She is S/P TI and right colon resection in 2016. She developed an anal fistula in Nov. 2023.  She was treated with a course of Flagyl.  She underwent an exam under anesthesia with excision of a right posterior sinus fistula tract in January 2024 by Dr. Johney Maine.  Grade 2 internal hemorrhoids were noted. Path showed acute and chronic inflammation with granulation tissue and a second specimen showed benign polypoid squamous mucosa with fibrosis.  She has been reluctant to consider biologic therapy. She remains on budesonide despite recommendations to discontinue and switch to an alternative therapy.  ESR=35, CRP normal. HBsAg negative, QuantiFERON gold negative.   Labs / Imaging        Latest Ref Rng & Units 03/27/2022   10:18 AM 06/21/2021   10:44 AM 12/14/2020    8:07 AM  Hepatic Function  Total Protein 6.0 - 8.3 g/dL 7.1  6.8  6.8   Albumin 3.5 - 5.2 g/dL 4.1  3.9  4.4   AST 0 - 37 U/L '17  14  14   '$ ALT 0 - 35 U/L '21  18  19   '$ Alk Phosphatase 39 - 117 U/L 48  44  52   Total Bilirubin 0.2 - 1.2 mg/dL 0.4  0.6  0.7   Bilirubin, Direct 0.0 - 0.3 mg/dL  0.1  0.16        Latest Ref Rng & Units 05/18/2022    6:12 AM 05/15/2022    9:55 AM 03/27/2022   10:18 AM  CBC  WBC 4.0 - 10.5 K/uL  15.3  14.2   Hemoglobin 12.0 - 15.0 g/dL 15.6  14.2  14.6   Hematocrit 36.0 - 46.0 % 46.0  43.1  44.5   Platelets 150.0 - 400.0 K/uL  356.0  329.0     Current Medications, Allergies, Past Medical History, Past Surgical History, Family History and Social History were reviewed in [employer] record.   Physical Exam: General: Well developed, well nourished, no acute distress Head: Normocephalic and atraumatic Eyes: Sclerae anicteric, EOMI Ears: Normal auditory acuity Neurological: Alert oriented x 4, grossly nonfocal Psychological:  Alert and cooperative. Normal mood and affect   Candace Begue T. Fuller Plan, MD 06/21/2022, 9:14 AM

## 2022-06-22 DIAGNOSIS — Z1283 Encounter for screening for malignant neoplasm of skin: Secondary | ICD-10-CM | POA: Diagnosis not present

## 2022-06-22 DIAGNOSIS — D225 Melanocytic nevi of trunk: Secondary | ICD-10-CM | POA: Diagnosis not present

## 2022-06-22 DIAGNOSIS — L720 Epidermal cyst: Secondary | ICD-10-CM | POA: Diagnosis not present

## 2022-06-28 ENCOUNTER — Other Ambulatory Visit: Payer: Self-pay | Admitting: Internal Medicine

## 2022-06-28 DIAGNOSIS — T502X5A Adverse effect of carbonic-anhydrase inhibitors, benzothiadiazides and other diuretics, initial encounter: Secondary | ICD-10-CM

## 2022-06-28 DIAGNOSIS — I10 Essential (primary) hypertension: Secondary | ICD-10-CM

## 2022-07-10 NOTE — Telephone Encounter (Signed)
I apologize. I did not realize. I have not received the fax yet. I am honestly not sure what fax number is in our system. The best fax number is [phone] if you do not mind faxing again. I will let others know in the office that we have to fax a clearance over in the future. Thank you again. ===View-only below this line=== ----- Message ----- From: Gevena Mart, RN Sent: 07/10/2022   8:44 AM EST To: Lindon Romp, CMA  Unfortunately, our surgeons do not see these messages. They need a faxed clearance form sent that I can get them to sign. Please let others in your office know as there was another one sent the same way.   I faxed the medical clearance to the fax number listed on the paper. It failed. Is there another number to fax it to? Thanks, Glenard Haring, RN - Triage  ----- Message ----- From: Lindon Romp, CMA Sent: 07/09/2022  10:48 AM EST To: Serafina Mitchell, MD; Gevena Mart, RN

## 2022-07-10 NOTE — Telephone Encounter (Signed)
Received a fax from Dr. Stephens Shire office stating Dr. Trula Slade said ok to hold Plavix x 5 days prior to scheduled procedure on 07/25/22.Informed patient of Dr. Aleen Campi recommendations. Patient verbalized understanding.

## 2022-07-17 ENCOUNTER — Encounter: Payer: Self-pay | Admitting: Gastroenterology

## 2022-07-18 ENCOUNTER — Other Ambulatory Visit: Payer: Self-pay | Admitting: Internal Medicine

## 2022-07-18 ENCOUNTER — Other Ambulatory Visit: Payer: Self-pay | Admitting: Gastroenterology

## 2022-07-18 DIAGNOSIS — T502X5A Adverse effect of carbonic-anhydrase inhibitors, benzothiadiazides and other diuretics, initial encounter: Secondary | ICD-10-CM

## 2022-07-18 DIAGNOSIS — E559 Vitamin D deficiency, unspecified: Secondary | ICD-10-CM

## 2022-07-18 DIAGNOSIS — I10 Essential (primary) hypertension: Secondary | ICD-10-CM

## 2022-07-19 ENCOUNTER — Ambulatory Visit (INDEPENDENT_AMBULATORY_CARE_PROVIDER_SITE_OTHER): Payer: 59

## 2022-07-19 DIAGNOSIS — E538 Deficiency of other specified B group vitamins: Secondary | ICD-10-CM

## 2022-07-19 MED ORDER — CYANOCOBALAMIN 1000 MCG/ML IJ SOLN
1000.0000 ug | Freq: Once | INTRAMUSCULAR | Status: AC
  Administered 2022-07-19: 1000 ug via INTRAMUSCULAR

## 2022-07-19 NOTE — Progress Notes (Signed)
Fax received from [facility] for medical clearance/medication hold for colonoscopy on 07/25/22 to be signed by Dr. Trula Slade.  Provider signed, form faxed back to sender, verified successful, sent to scan center.

## 2022-07-19 NOTE — Progress Notes (Signed)
Pt was given B12 injection w/o any complications. 

## 2022-07-25 ENCOUNTER — Encounter: Payer: Self-pay | Admitting: Gastroenterology

## 2022-07-25 ENCOUNTER — Telehealth: Payer: Self-pay | Admitting: *Deleted

## 2022-07-25 ENCOUNTER — Ambulatory Visit (AMBULATORY_SURGERY_CENTER): Payer: 59 | Admitting: Gastroenterology

## 2022-07-25 VITALS — BP 166/82 | HR 52 | Temp 96.8°F | Resp 14 | Ht 61.0 in | Wt 219.0 lb

## 2022-07-25 DIAGNOSIS — G4733 Obstructive sleep apnea (adult) (pediatric): Secondary | ICD-10-CM | POA: Diagnosis not present

## 2022-07-25 DIAGNOSIS — D125 Benign neoplasm of sigmoid colon: Secondary | ICD-10-CM | POA: Diagnosis not present

## 2022-07-25 DIAGNOSIS — K5140 Inflammatory polyps of colon without complications: Secondary | ICD-10-CM

## 2022-07-25 DIAGNOSIS — K635 Polyp of colon: Secondary | ICD-10-CM | POA: Diagnosis not present

## 2022-07-25 DIAGNOSIS — K50018 Crohn's disease of small intestine with other complication: Secondary | ICD-10-CM | POA: Diagnosis not present

## 2022-07-25 DIAGNOSIS — K50813 Crohn's disease of both small and large intestine with fistula: Secondary | ICD-10-CM

## 2022-07-25 DIAGNOSIS — J449 Chronic obstructive pulmonary disease, unspecified: Secondary | ICD-10-CM | POA: Diagnosis not present

## 2022-07-25 DIAGNOSIS — D123 Benign neoplasm of transverse colon: Secondary | ICD-10-CM

## 2022-07-25 MED ORDER — SODIUM CHLORIDE 0.9 % IV SOLN: 500.0000 mL | Freq: Once | INTRAVENOUS | Status: DC

## 2022-07-25 NOTE — Patient Instructions (Signed)
-  Handout on polyps provided -await pathology results -repeat colonoscopy for surveillance recommended. Date to be determined when pathology result become available   -Continue present medications  -follow up in clinic in one month Appointment 09/03/22 @ 9:50 am   [place]:   Refer to the procedure report that was given to you for any specific questions about what was found during the examination.  If the procedure report does not answer your questions, please call your gastroenterologist to clarify.  If you requested that your care partner not be given the details of your procedure findings, then the procedure report has been included in a sealed envelope for you to review at your convenience later.  YOU SHOULD EXPECT: Some feelings of bloating in the abdomen. Passage of more gas than usual.  Walking can help get rid of the air that was put into your GI tract during the procedure and reduce the bloating. If you had a lower endoscopy (such as a colonoscopy or flexible sigmoidoscopy) you may notice spotting of blood in your stool or on the toilet paper. If you underwent a bowel prep for your procedure, you may not have a normal bowel movement for a few days.  Please Note:  You might notice some irritation and congestion in your nose or some drainage.  This is from the oxygen used during your procedure.  There is no need for concern and it should clear up in a day or so.  SYMPTOMS TO REPORT IMMEDIATELY:  Following lower endoscopy (colonoscopy or flexible sigmoidoscopy):  Excessive amounts of blood in the stool  Significant tenderness or worsening of abdominal pains  Swelling of the abdomen that is new, acute  Fever of 100F or higher  For urgent or emergent issues, a gastroenterologist can be reached at any hour by calling [phone]. Do not use MyChart messaging for urgent concerns.    DIET:  We do recommend a small meal at first,  but then you may proceed to your regular diet.  Drink plenty of fluids but you should avoid alcoholic beverages for 24 hours.  ACTIVITY:  You should plan to take it easy for the rest of today and you should NOT DRIVE or use heavy machinery until tomorrow (because of the sedation medicines used during the test).    FOLLOW UP: Our staff will call the number listed on your records the next business day following your procedure.  We will call around 7:15- 8:00 am to check on you and address any questions or concerns that you may have regarding the information given to you following your procedure. If we do not reach you, we will leave a message.     If any biopsies were taken you will be contacted by phone or by letter within the next 1-3 weeks.  Please call us at [phone] if you have not heard about the biopsies in 3 weeks.    SIGNATURES/CONFIDENTIALITY: You and/or your care partner have signed paperwork which will be entered into your electronic medical record.  These signatures attest to the fact that that the information above on your After Visit Summary has been reviewed and is understood.  Full responsibility of the confidentiality of this discharge information lies with you and/or your care-partner.

## 2022-07-25 NOTE — Progress Notes (Signed)
Sedate, gd SR's, VSS, report to RN 

## 2022-07-25 NOTE — Progress Notes (Signed)
Pt's states no medical or surgical changes since previsit or office visit. 

## 2022-07-25 NOTE — Telephone Encounter (Signed)
Pt instructed to resume Plavix at prior dose in two days. Pt verbalized understanding.

## 2022-07-25 NOTE — Op Note (Addendum)
[facility] Patient Name: April May Procedure Date: 07/25/2022 11:11 AM MRN: [id number] Endoscopist: Ladene Artist , MD, [id number] Age: 67 Referring MD:  Date of Birth: February 14, 1956 Gender: Female Account #: [id number] Procedure:                Colonoscopy Indications:              Determine extent and severity of inflammatory bowel                            disease, Crohn's Medicines:                Monitored Anesthesia Care Procedure:                Pre-Anesthesia Assessment:                           - Prior to the procedure, a History and Physical                            was performed, and patient medications and                            allergies were reviewed. The patient's tolerance of                            previous anesthesia was also reviewed. The risks                            and benefits of the procedure and the sedation                            options and risks were discussed with the patient.                            All questions were answered, and informed consent                            was obtained. Prior Anticoagulants: The patient has                            taken Plavix (clopidogrel), last dose was 5 days                            prior to procedure. ASA Grade Assessment: III - A                            patient with severe systemic disease. After                            reviewing the risks and benefits, the patient was                            deemed in satisfactory condition to undergo the  procedure.                           After obtaining informed consent, the colonoscope                            was passed under direct vision. Throughout the                            procedure, the patient's blood pressure, pulse, and                            oxygen saturations were monitored continuously. The                            Olympus CF-HQ190L [id number]) Colonoscope was                             introduced through the anus and advanced to the the                            ileocolonic anastomosis. The terminal ileum and the                            rectum were photographed. The quality of the bowel                            preparation was good. The colonoscopy was performed                            without difficulty. The patient tolerated the                            procedure well. Scope In: 11:29:42 AM Scope Out: 11:48:32 AM Scope Withdrawal Time: 0 hours 13 minutes 7 seconds  Total Procedure Duration: 0 hours 18 minutes 50 seconds  Findings:                 The perianal and digital rectal examinations were                            normal.                           There was evidence of a prior end-to-side                            ileo-colonic anastomosis in the ascending colon.                            This was patent and was characterized by                            neoterminal ileum erosions. The anastomosis was not  traversed. Biopsies were taken with a cold forceps                            for histology.                           Three sessile polyps were found in the sigmoid                            colon and transverse colon (2). The polyps were 6                            to 7 mm in size. These polyps were removed with a                            cold snare. Resection and retrieval were complete.                           Internal hemorrhoids were found during                            retroflexion. The hemorrhoids were small and Grade                            I (internal hemorrhoids that do not prolapse).                           The exam was otherwise without abnormality on                            direct and retroflexion views. Random biopsies                            obtained. Complications:            No immediate complications. Estimated blood loss:                            None. Estimated  Blood Loss:     Estimated blood loss: none. Impression:               - Patent end-to-side ileo-colonic anastomosis,                            characterized by neoterminal ileum erosions.                            Biopsied.                           - Three 6 to 7 mm polyps in the sigmoid colon and                            in the transverse colon, removed with a cold snare.  Resected and retrieved.                           - Internal hemorrhoids.                           - The examination was otherwise normal on direct                            and retroflexion views. Biopsied. Recommendation:           - Repeat colonoscopy after studies are complete for                            surveillance based on pathology results.                           - Patient has a contact number available for                            emergencies. The signs and symptoms of potential                            delayed complications were discussed with the                            patient. Return to normal activities tomorrow.                            Written discharge instructions were provided to the                            patient.                           - Resume previous diet.                           - Continue present medications.                           - Await pathology results.                           - Return to GI office in 1 month.                           - Resume Plavix (clopidogrel) at prior dose in 2                            days. Refer to managing physician for further                            adjustment of therapy. Ladene Artist, MD 07/25/2022 11:54:59 AM This report has been signed electronically.

## 2022-07-25 NOTE — Progress Notes (Signed)
History & Physical  Primary Care Physician:  Janith Lima, MD Primary Gastroenterologist: Lucio Edward, MD  Impression / Plan:  Crohn's disease of the small bowel and colon with perianal fistula.  CHIEF COMPLAINT: Crohn's disease  HPI: April May is a 67 y.o. female with Crohn's disease of the small bowel and colon with perianal fistula.   Past Medical History:  Diagnosis Date   Abrasion 05/10/2022   right foot   Anal fistula    Anticoagulated    plavix and asa--- managed by vascular, dr Trula Slade   Arthritis    B12 deficiency    CKD (chronic kidney disease), stage I    [facility] --- samantha collins PA (lov in epic/ media tab 02-23-2022)   COPD (chronic obstructive pulmonary disease) ([place])    Coronary artery calcification seen on CAT scan    cardiologist--- dr Marlou Porch;    nuclear stress test 10-29-2017  normal perfusion , nuclear ef 60%;   echo 10-16-2017  ef 55-60%, mild TR   Crohn's disease of both small and large intestine ([place])    followed by dr Almena Hokenson  (GI   Full dentures    GAD (generalized anxiety disorder)    History of acute pancreatitis    secondary to azathioprine   History of small bowel obstruction 11/24/2014   admission in epic  for crohn's flare w/ sbo and abscess s/p  surgical intervention   History of squamous cell carcinoma of skin 06/11/2017   excision left arm   Hyperlipidemia, mixed    followed by cardiology and pcp;   statin intolerate   MDD (major depressive disorder)    OSA on CPAP    followed by dr Dia Sitter ;  (05-16-2022  per pt uses nightly)   study in epic 01-05-2020  AHI 15.4/hr;  does not use cpap since weight loss 1 and 1/2 years ago   Perianal lesion    Peripheral vascular disease ([place]) 02/2022   vascular--- dr Trula Slade;   03-20-2022  s/p  balloon angioplasty right occluded peroneal artery, right anterior tibial artery, right tibioperoneal truck   PONV (postoperative nausea and vomiting)    Pre-diabetes    Resistant  hypertension    followed by pcp and cardiology   Swelling of right foot 05/10/2022   Vitamin D deficiency     Past Surgical History:  Procedure Laterality Date   ABDOMINAL AORTOGRAM W/LOWER EXTREMITY N/A 03/20/2022   Procedure: ABDOMINAL AORTOGRAM W/LOWER EXTREMITY;  Surgeon: Serafina Mitchell, MD;  Location: [facility];  Service: Cardiovascular;  Laterality: N/A;   ABDOMINAL HYSTERECTOMY     age 85;  per pt thinks ovaries were removed   APPENDECTOMY     teen   APPLICATION OF WOUND VAC  11/24/2014   Procedure: APPLICATION OF WOUND VAC;  Surgeon: Donnie Mesa, MD;  Location: [facility];  Service: General;;   BOWEL RESECTION N/A 11/24/2014   Procedure: SMALL BOWEL RESECTION;  Surgeon: Donnie Mesa, MD;  Location: [facility];  Service: General;  Laterality: N/A;   BREAST EXCISIONAL BIOPSY Right 1972   benign   LAPAROTOMY N/A 11/24/2014   Procedure: EXPLORATORY LAPAROTOMY EXTENSIVE LYSIS OF ADHESIONS SAMLL BOWEL RESECTION RIGHT HEMI COLECTOMY WOUND VAC APPLICATION. ;  Surgeon: Donnie Mesa, MD;  Location: [facility];  Service: General;  Laterality: N/A;   LYSIS OF ADHESION  11/24/2014   Procedure: LYSIS OF ADHESION (3 HRS);  Surgeon: Donnie Mesa, MD;  Location: [facility];  Service: General;;   PARTIAL COLECTOMY  11/24/2014   Procedure: RIGHT HEMI COLECTOMY;  Surgeon: Donnie Mesa, MD;  Location: [facility];  Service: General;;   PERIPHERAL VASCULAR BALLOON ANGIOPLASTY  03/20/2022   Procedure: PERIPHERAL VASCULAR BALLOON ANGIOPLASTY;  Surgeon: Serafina Mitchell, MD;  Location: [facility];  Service: Cardiovascular;;  AT and Peroneal   SMALL INTESTINE SURGERY     age 31  (for crohn's disease)   TONSILLECTOMY     teen    Prior to Admission medications   Medication Sig Start Date End Date Taking? Authorizing Provider  amLODipine (NORVASC) 10 MG tablet TAKE 1 TABLET BY MOUTH DAILY 05/23/22  Yes Janith Lima, MD  aspirin EC 81 MG tablet Take 1 tablet (81 mg total) by mouth daily. Swallow  whole. 03/20/22 03/20/23 Yes Serafina Mitchell, MD  budesonide (ENTOCORT EC) 3 MG 24 hr capsule TAKE 3 CAPSULES (9 MG TOTAL) BY MOUTH DAILY. 06/18/22  Yes Ladene Artist, MD  buPROPion (WELLBUTRIN XL) 150 MG 24 hr tablet TAKE 1 TABLET BY MOUTH DAILY 06/01/22  Yes Janith Lima, MD  Cholecalciferol (VITAMIN D3) 1.25 MG (50000 UT) capsule TAKE 1 CAPSULE BY MOUTH ONE TIME PER WEEK 07/18/22  Yes Janith Lima, MD  Cyanocobalamin (B-12 COMPLIANCE INJECTION) 1000 MCG/ML KIT Inject as directed every 30 (thirty) days.   Yes [provider]  ezetimibe (ZETIA) 10 MG tablet TAKE 1 TABLET BY MOUTH DAILY 02/14/22  Yes Jerline Pain, MD  lamoTRIgine (LAMICTAL) 200 MG tablet Take 1 tablet (200 mg total) by mouth at bedtime. 02/22/22  Yes Burns, Claudina Lick, MD  nebivolol (BYSTOLIC) 10 MG tablet TAKE 1 TABLET BY MOUTH DAILY 06/01/22  Yes Janith Lima, MD  spironolactone (ALDACTONE) 100 MG tablet TAKE 1 TABLET BY MOUTH EVERY DAY 07/18/22  Yes Janith Lima, MD  albuterol (PROAIR HFA) 108 (90 Base) MCG/ACT inhaler TAKE 2 PUFFS BY MOUTH EVERY 6 HOURS AS NEEDED FOR WHEEZE OR SHORTNESS OF BREATH Patient taking differently: Inhale 2 puffs into the lungs every 6 (six) hours as needed for shortness of breath or wheezing. TAKE 2 PUFFS BY MOUTH EVERY 6 HOURS AS NEEDED FOR WHEEZE OR SHORTNESS OF BREATH 08/01/21   Binnie Rail, MD  clonazePAM (KLONOPIN) 0.5 MG tablet Take 1 tablet (0.5 mg total) by mouth 2 (two) times daily as needed for anxiety. Patient taking differently: Take 0.5 mg by mouth 2 (two) times daily as needed for anxiety. 10/30/21   Janith Lima, MD  clopidogrel (PLAVIX) 75 MG tablet Take 1 tablet (75 mg total) by mouth daily. 03/20/22   Serafina Mitchell, MD  [employer] PAIN RELIEF EXTRA STRENGTH 500 MG tablet TAKE 1 TABLET BY MOUTH EVERY 8 HOURS AS NEEDED FOR MODERATE PAIN Patient taking differently: Take 500-1,000 mg by mouth every 8 (eight) hours as needed for moderate pain. 08/25/18   Janith Lima, MD   fluticasone (FLONASE) 50 MCG/ACT nasal spray Place 2 sprays into both nostrils daily. Patient taking differently: Place 2 sprays into both nostrils daily as needed for allergies. 12/23/19   Janith Lima, MD  neomycin-bacitracin-polymyxin [phone] OINT Apply 1 Application topically daily. Right foot abrasion    [provider]  nitroGLYCERIN (NITRO-BID) 2 % ointment Apply 0.5 inches topically 3 (three) times daily. Patient taking differently: Apply 0.5 inches topically 3 (three) times daily. Per pt applies to right great toe 05/02/22   Felipa Furnace, DPM  oxyCODONE (OXY IR/ROXICODONE) 5 MG immediate release tablet Take 1 tablet (5  mg total) by mouth every 6 (six) hours as needed for moderate pain, severe pain or breakthrough pain. 05/18/22   Michael Boston, MD  Potassium Citrate 15 MEQ (1620 MG) TBCR Take 1 tablet by mouth in the morning, at noon, and at bedtime. 06/28/22   Janith Lima, MD  STIOLTO RESPIMAT 2.5-2.5 MCG/ACT AERS USE 2 INHALATIONS BY MOUTH  DAILY Patient taking differently: Inhale 2 each into the lungs daily as needed. 12/21/21   Janith Lima, MD    Current Outpatient Medications  Medication Sig Dispense Refill   amLODipine (NORVASC) 10 MG tablet TAKE 1 TABLET BY MOUTH DAILY 100 tablet 0   aspirin EC 81 MG tablet Take 1 tablet (81 mg total) by mouth daily. Swallow whole. 150 tablet 2   budesonide (ENTOCORT EC) 3 MG 24 hr capsule TAKE 3 CAPSULES (9 MG TOTAL) BY MOUTH DAILY. 270 capsule 0   buPROPion (WELLBUTRIN XL) 150 MG 24 hr tablet TAKE 1 TABLET BY MOUTH DAILY 100 tablet 1   Cholecalciferol (VITAMIN D3) 1.25 MG (50000 UT) capsule TAKE 1 CAPSULE BY MOUTH ONE TIME PER WEEK 12 capsule 0   Cyanocobalamin (B-12 COMPLIANCE INJECTION) 1000 MCG/ML KIT Inject as directed every 30 (thirty) days.     ezetimibe (ZETIA) 10 MG tablet TAKE 1 TABLET BY MOUTH DAILY 60 tablet 5   lamoTRIgine (LAMICTAL) 200 MG tablet Take 1 tablet (200 mg total) by mouth at bedtime. 100 tablet 1    nebivolol (BYSTOLIC) 10 MG tablet TAKE 1 TABLET BY MOUTH DAILY 100 tablet 1   spironolactone (ALDACTONE) 100 MG tablet TAKE 1 TABLET BY MOUTH EVERY DAY 90 tablet 0   albuterol (PROAIR HFA) 108 (90 Base) MCG/ACT inhaler TAKE 2 PUFFS BY MOUTH EVERY 6 HOURS AS NEEDED FOR WHEEZE OR SHORTNESS OF BREATH (Patient taking differently: Inhale 2 puffs into the lungs every 6 (six) hours as needed for shortness of breath or wheezing. TAKE 2 PUFFS BY MOUTH EVERY 6 HOURS AS NEEDED FOR WHEEZE OR SHORTNESS OF BREATH) 18 g 5   clonazePAM (KLONOPIN) 0.5 MG tablet Take 1 tablet (0.5 mg total) by mouth 2 (two) times daily as needed for anxiety. (Patient taking differently: Take 0.5 mg by mouth 2 (two) times daily as needed for anxiety.) 60 tablet 3   clopidogrel (PLAVIX) 75 MG tablet Take 1 tablet (75 mg total) by mouth daily. 30 tablet 11   [employer] PAIN RELIEF EXTRA STRENGTH 500 MG tablet TAKE 1 TABLET BY MOUTH EVERY 8 HOURS AS NEEDED FOR MODERATE PAIN (Patient taking differently: Take 500-1,000 mg by mouth every 8 (eight) hours as needed for moderate pain.) 60 tablet 5   fluticasone (FLONASE) 50 MCG/ACT nasal spray Place 2 sprays into both nostrils daily. (Patient taking differently: Place 2 sprays into both nostrils daily as needed for allergies.) 48 g 3   neomycin-bacitracin-polymyxin [phone] OINT Apply 1 Application topically daily. Right foot abrasion     nitroGLYCERIN (NITRO-BID) 2 % ointment Apply 0.5 inches topically 3 (three) times daily. (Patient taking differently: Apply 0.5 inches topically 3 (three) times daily. Per pt applies to right great toe) 30 g 1   oxyCODONE (OXY IR/ROXICODONE) 5 MG immediate release tablet Take 1 tablet (5 mg total) by mouth every 6 (six) hours as needed for moderate pain, severe pain or breakthrough pain. 30 tablet 0   Potassium Citrate 15 MEQ (1620 MG) TBCR Take 1 tablet by mouth in the morning, at noon, and at bedtime. 300 tablet 0   STIOLTO RESPIMAT  2.5-2.5 MCG/ACT AERS USE 2  INHALATIONS BY MOUTH  DAILY (Patient taking differently: Inhale 2 each into the lungs daily as needed.) 12 g 1   Current Facility-Administered Medications  Medication Dose Route Frequency Provider Last Rate Last Admin   0.9 %  sodium chloride infusion  500 mL Intravenous Once Ladene Artist, MD        Allergies as of 07/25/2022 - Review Complete 07/25/2022  Allergen Reaction Noted   Azathioprine Other (See Comments) 08/04/2007   Crestor [rosuvastatin calcium] Other (See Comments) 08/15/2011   Lipitor [atorvastatin calcium] Other (See Comments) 08/24/2011   Semaglutide Other (See Comments) 02/16/2022   Nsaids Other (See Comments) 04/17/2022   Lisinopril  04/27/2011    Family History  Problem Relation Age of Onset   Hypertension Mother    Ulcerative colitis Mother    Crohn's disease Mother    Heart disease Mother    Kidney disease Mother    Arthritis Father    Brain cancer Father    Diabetes Father    Alcohol abuse Paternal Uncle    Diabetes Paternal Grandmother    Stroke Paternal Grandmother    Heart failure Sister    Kidney disease Sister    Heart failure Brother    Other Sister        prediabetes   Kidney cancer Brother     Social History   Socioeconomic History   Marital status: Divorced    Spouse name: Not on file   Number of children: 2   Years of education: Not on file   Highest education level: 12th grade  Occupational History   Occupation: disabled  Tobacco Use   Smoking status: Every Day    Packs/day: 0.75    Years: 48.00    Total pack years: 36.00    Types: Cigarettes   Smokeless tobacco: Never   Tobacco comments:    05-16-2022  per pt started smoking as a teen and trying to quit  Vaping Use   Vaping Use: Never used  Substance and Sexual Activity   Alcohol use: No   Drug use: Never   Sexual activity: Not on file  Other Topics Concern   Not on file  Social History Narrative   Not on file   Social Determinants of Health   Financial  Resource Strain: Low Risk  (11/24/2021)   Overall Financial Resource Strain (CARDIA)    Difficulty of Paying Living Expenses: Not hard at all  Food Insecurity: No Food Insecurity (11/24/2021)   Hunger Vital Sign    Worried About Running Out of Food in the Last Year: Never true    Ran Out of Food in the Last Year: Never true  Transportation Needs: No Transportation Needs (11/24/2021)   PRAPARE - Hydrologist (Medical): No    Lack of Transportation (Non-Medical): No  Physical Activity: Sufficiently Active (11/24/2021)   Exercise Vital Sign    Days of Exercise per Week: 5 days    Minutes of Exercise per Session: 30 min  Stress: No Stress Concern Present (11/24/2021)   [place]    Feeling of Stress : Not at all  Social Connections: Moderately Integrated (11/24/2021)   Social Connection and Isolation Panel [NHANES]    Frequency of Communication with Friends and Family: More than three times a week    Frequency of Social Gatherings with Friends and Family: Once a week    Attends Religious Services: More  than 4 times per year    Active Member of Clubs or Organizations: No    Attends Archivist Meetings: More than 4 times per year    Marital Status: Divorced  Intimate Partner Violence: Not At Risk (11/22/2020)   Humiliation, Afraid, Rape, and Kick questionnaire    Fear of Current or Ex-Partner: No    Emotionally Abused: No    Physically Abused: No    Sexually Abused: No    Review of Systems:  All systems reviewed were negative except where noted in HPI.   Physical Exam: General:  Alert, well-developed, in NAD Head:  Normocephalic and atraumatic. Eyes:  Sclera clear, no icterus.   Conjunctiva pink. Ears:  Normal auditory acuity. Mouth:  No deformity or lesions.  Neck:  Supple; no masses. Lungs:  Clear throughout to auscultation.   No wheezes, crackles, or rhonchi.  Heart:  Regular  rate and rhythm; no murmurs. Abdomen:  Soft, nondistended, nontender. No masses, hepatomegaly. No palpable masses.  Normal bowel sounds.    Rectal:  Deferred   Msk:  Symmetrical without gross deformities. Extremities:  Without edema. Neurologic:  Alert and  oriented x 4; grossly normal neurologically. Skin:  Intact without significant lesions or rashes. Psych:  Alert and cooperative. Normal mood and affect.  Pricilla Riffle. Fuller Plan  07/25/2022, 11:21 AM See Shea Evans, [facility], to contact our on call provider

## 2022-07-25 NOTE — Progress Notes (Signed)
Called to room to assist during endoscopic procedure.  Patient ID and intended procedure confirmed with present staff. Received instructions for my participation in the procedure from the performing physician.  

## 2022-07-26 ENCOUNTER — Telehealth: Payer: Self-pay | Admitting: *Deleted

## 2022-07-26 NOTE — Telephone Encounter (Signed)
  Follow up Call-     07/25/2022   11:04 AM  Call back number  Post procedure Call Back phone  # [phone]  Permission to leave phone message Yes     Patient questions:  Do you have a fever, pain , or abdominal swelling? No. Pain Score  0 *  Have you tolerated food without any problems? Yes.    Have you been able to return to your normal activities? Yes.    Do you have any questions about your discharge instructions: Diet   No. Medications  No. Follow up visit  No.  Do you have questions or concerns about your Care? No.  Actions: * If pain score is 4 or above: No action needed, pain <4.

## 2022-07-27 ENCOUNTER — Other Ambulatory Visit: Payer: Self-pay | Admitting: Internal Medicine

## 2022-07-27 DIAGNOSIS — I10 Essential (primary) hypertension: Secondary | ICD-10-CM

## 2022-08-15 ENCOUNTER — Encounter: Payer: Self-pay | Admitting: Gastroenterology

## 2022-08-23 ENCOUNTER — Ambulatory Visit (INDEPENDENT_AMBULATORY_CARE_PROVIDER_SITE_OTHER): Payer: 59 | Admitting: *Deleted

## 2022-08-23 DIAGNOSIS — E538 Deficiency of other specified B group vitamins: Secondary | ICD-10-CM

## 2022-08-23 MED ORDER — CYANOCOBALAMIN 1000 MCG/ML IJ SOLN
1000.0000 ug | Freq: Once | INTRAMUSCULAR | Status: AC
  Administered 2022-08-23: 1000 ug via INTRAMUSCULAR

## 2022-08-23 NOTE — Progress Notes (Signed)
Patient here in office to get her B12 injection. Given in left deltoid. Patient tolerated well

## 2022-08-24 ENCOUNTER — Ambulatory Visit (HOSPITAL_COMMUNITY): Payer: 59

## 2022-08-29 ENCOUNTER — Ambulatory Visit (INDEPENDENT_AMBULATORY_CARE_PROVIDER_SITE_OTHER): Payer: 59 | Admitting: Internal Medicine

## 2022-08-29 ENCOUNTER — Encounter: Payer: Self-pay | Admitting: Internal Medicine

## 2022-08-29 VITALS — BP 120/74 | HR 45 | Temp 98.5°F | Ht 61.0 in | Wt 219.0 lb

## 2022-08-29 DIAGNOSIS — N6002 Solitary cyst of left breast: Secondary | ICD-10-CM | POA: Diagnosis not present

## 2022-08-29 NOTE — Patient Instructions (Signed)
       Medications changes include :   none    Follow up with your dermatologist.

## 2022-08-29 NOTE — Progress Notes (Signed)
Subjective:    Patient ID: April May, female    DOB: [birth year], 67 y.o.   MRN: [id number]      HPI April May is here for  Chief Complaint  Patient presents with   Cyst    Cyst on left side of breast; Was seen by dermatology and was put on abx. Cyst never opened. She has been doing warm compresses      On side of left breast she has a cyst.  Saw derm in Feburary -she had a lesion then and was told it was not cancer -she was advised it was just a cyst and was put on doxycyline and another abx.  They told her it was a staph infection.  She also had something similar on her right breast previously.    The area did improve-got smaller, but it never went away completely  Recently it has gotten larger.  It does not hurt.  It does not itch.  No f/c.  She was hoping that we could do an incision and drainage and get rid of the cyst.   Last mammo 03/02/22 - normal.   Medications and allergies reviewed with patient and updated if appropriate.  Current Outpatient Medications on File Prior to Visit  Medication Sig Dispense Refill   albuterol (PROAIR HFA) 108 (90 Base) MCG/ACT inhaler TAKE 2 PUFFS BY MOUTH EVERY 6 HOURS AS NEEDED FOR WHEEZE OR SHORTNESS OF BREATH (Patient taking differently: Inhale 2 puffs into the lungs every 6 (six) hours as needed for shortness of breath or wheezing. TAKE 2 PUFFS BY MOUTH EVERY 6 HOURS AS NEEDED FOR WHEEZE OR SHORTNESS OF BREATH) 18 g 5   amLODipine (NORVASC) 10 MG tablet TAKE 1 TABLET BY MOUTH DAILY 100 tablet 0   aspirin EC 81 MG tablet Take 1 tablet (81 mg total) by mouth daily. Swallow whole. 150 tablet 2   budesonide (ENTOCORT EC) 3 MG 24 hr capsule TAKE 3 CAPSULES (9 MG TOTAL) BY MOUTH DAILY. 270 capsule 0   buPROPion (WELLBUTRIN XL) 150 MG 24 hr tablet TAKE 1 TABLET BY MOUTH DAILY 100 tablet 1   Cholecalciferol (VITAMIN D3) 1.25 MG (50000 UT) capsule TAKE 1 CAPSULE BY MOUTH ONE TIME PER WEEK 12 capsule 0   clonazePAM (KLONOPIN) 0.5 MG  tablet Take 1 tablet (0.5 mg total) by mouth 2 (two) times daily as needed for anxiety. (Patient taking differently: Take 0.5 mg by mouth 2 (two) times daily as needed for anxiety.) 60 tablet 3   clopidogrel (PLAVIX) 75 MG tablet Take 1 tablet (75 mg total) by mouth daily. 30 tablet 11   [employer] PAIN RELIEF EXTRA STRENGTH 500 MG tablet TAKE 1 TABLET BY MOUTH EVERY 8 HOURS AS NEEDED FOR MODERATE PAIN (Patient taking differently: Take 500-1,000 mg by mouth every 8 (eight) hours as needed for moderate pain.) 60 tablet 5   Cyanocobalamin (B-12 COMPLIANCE INJECTION) 1000 MCG/ML KIT Inject as directed every 30 (thirty) days.     ezetimibe (ZETIA) 10 MG tablet TAKE 1 TABLET BY MOUTH DAILY 60 tablet 5   fluticasone (FLONASE) 50 MCG/ACT nasal spray Place 2 sprays into both nostrils daily. (Patient taking differently: Place 2 sprays into both nostrils daily as needed for allergies.) 48 g 3   lamoTRIgine (LAMICTAL) 200 MG tablet Take 1 tablet (200 mg total) by mouth at bedtime. 100 tablet 1   nebivolol (BYSTOLIC) 10 MG tablet TAKE 1 TABLET BY MOUTH DAILY 100 tablet 1   neomycin-bacitracin-polymyxin [phone] OINT Apply  1 Application topically daily. Right foot abrasion     nitroGLYCERIN (NITRO-BID) 2 % ointment Apply 0.5 inches topically 3 (three) times daily. (Patient taking differently: Apply 0.5 inches topically 3 (three) times daily. Per pt applies to right great toe) 30 g 1   oxyCODONE (OXY IR/ROXICODONE) 5 MG immediate release tablet Take 1 tablet (5 mg total) by mouth every 6 (six) hours as needed for moderate pain, severe pain or breakthrough pain. 30 tablet 0   Potassium Citrate 15 MEQ (1620 MG) TBCR Take 1 tablet by mouth in the morning, at noon, and at bedtime. 300 tablet 0   spironolactone (ALDACTONE) 100 MG tablet TAKE 1 TABLET BY MOUTH EVERY DAY 90 tablet 0   STIOLTO RESPIMAT 2.5-2.5 MCG/ACT AERS USE 2 INHALATIONS BY MOUTH  DAILY (Patient taking differently: Inhale 2 each into the lungs daily as  needed.) 12 g 1   No current facility-administered medications on file prior to visit.    Review of Systems     Objective:   Vitals:   08/29/22 0938  BP: 120/74  Pulse: (!) 45  Temp: 98.5 F (36.9 C)  SpO2: 98%   BP Readings from Last 3 Encounters:  08/29/22 120/74  07/25/22 (!) 166/82  06/21/22 136/86   Wt Readings from Last 3 Encounters:  08/29/22 219 lb (99.3 kg)  07/25/22 219 lb (99.3 kg)  06/21/22 219 lb (99.3 kg)   Body mass index is 41.38 kg/m.    Physical Exam Constitutional:      General: She is not in acute distress.    Appearance: Normal appearance. She is not ill-appearing.  HENT:     Head: Normocephalic and atraumatic.  Skin:    General: Skin is warm and dry.     Comments: Left lateral breast area about 4 cm in length and 3 cm in width circular ring with central portion that looks raised, lumpy area of skin with 1 pinprick area that looks like it wants to drain.  May be slight area of fluctuance, but nothing significant.  Whole area is not tender to palpation, no warmth  Neurological:     Mental Status: She is alert.            Assessment & Plan:    Left breast cyst: Acute on chronic Benign cyst-superficial-dermal cyst She had this couple of months ago and did see dermatology-they did give her medication biotics at that time and improved did, but it never went away completely they did tell her it was a staph infection, but it was a cyst She deferred having the cyst surgically removed at that time Recently cyst has gotten larger again and she like to have it drained and this is removed There is no evidence of infection at this time-the area is not tender and there is no major area of fluctuance-if anything I could drain out a small amount of fluid, but advised this would not fix the problem and it would likely recur Discussed antibiotics-she deferred and I think that is reasonable since she is not having any pain She will see her dermatologist  about having the cyst removed

## 2022-08-31 DIAGNOSIS — L720 Epidermal cyst: Secondary | ICD-10-CM | POA: Diagnosis not present

## 2022-09-03 ENCOUNTER — Telehealth: Payer: Self-pay | Admitting: Gastroenterology

## 2022-09-03 ENCOUNTER — Ambulatory Visit: Payer: 59 | Admitting: Gastroenterology

## 2022-09-03 MED ORDER — BUDESONIDE 3 MG PO CPEP: 9.0000 mg | ORAL_CAPSULE | Freq: Every day | ORAL | 0 refills | Status: DC

## 2022-09-03 NOTE — Telephone Encounter (Signed)
Inbound call from patient wishing to reschedule her appointment for today, patient was rescheduled for 6/17 but would like refill on budenoside sent to pharmacy on file.

## 2022-09-03 NOTE — Telephone Encounter (Signed)
Prescription sent to patient's pharmacy.

## 2022-09-05 ENCOUNTER — Other Ambulatory Visit: Payer: Self-pay | Admitting: Internal Medicine

## 2022-09-05 DIAGNOSIS — E876 Hypokalemia: Secondary | ICD-10-CM

## 2022-09-05 DIAGNOSIS — I10 Essential (primary) hypertension: Secondary | ICD-10-CM

## 2022-09-14 ENCOUNTER — Ambulatory Visit (HOSPITAL_COMMUNITY)
Admission: RE | Admit: 2022-09-14 | Discharge: 2022-09-14 | Disposition: A | Discharge status: Home / Self Care | Payer: 59 | Source: Ambulatory Visit | Attending: Acute Care | Admitting: Acute Care

## 2022-09-14 DIAGNOSIS — Z122 Encounter for screening for malignant neoplasm of respiratory organs: Secondary | ICD-10-CM

## 2022-09-14 DIAGNOSIS — F17210 Nicotine dependence, cigarettes, uncomplicated: Secondary | ICD-10-CM

## 2022-09-14 DIAGNOSIS — Z87891 Personal history of nicotine dependence: Secondary | ICD-10-CM

## 2022-09-18 ENCOUNTER — Encounter: Payer: Self-pay | Admitting: Adult Health

## 2022-09-24 ENCOUNTER — Ambulatory Visit (INDEPENDENT_AMBULATORY_CARE_PROVIDER_SITE_OTHER): Payer: 59

## 2022-09-24 DIAGNOSIS — E538 Deficiency of other specified B group vitamins: Secondary | ICD-10-CM | POA: Diagnosis not present

## 2022-09-24 MED ORDER — CYANOCOBALAMIN 1000 MCG/ML IJ SOLN
1000.0000 ug | Freq: Once | INTRAMUSCULAR | Status: AC
  Administered 2022-09-24: 1000 ug via INTRAMUSCULAR

## 2022-09-24 NOTE — Progress Notes (Signed)
Patient here for monthly B12 injection per Dr. Jones.  B12 1000 mcg given in right IM and patient tolerated injection well today.  

## 2022-09-28 ENCOUNTER — Other Ambulatory Visit: Payer: Self-pay | Admitting: Internal Medicine

## 2022-09-28 DIAGNOSIS — I10 Essential (primary) hypertension: Secondary | ICD-10-CM

## 2022-09-28 DIAGNOSIS — E876 Hypokalemia: Secondary | ICD-10-CM

## 2022-10-06 DIAGNOSIS — Z20822 Contact with and (suspected) exposure to covid-19: Secondary | ICD-10-CM | POA: Diagnosis not present

## 2022-10-06 DIAGNOSIS — I517 Cardiomegaly: Secondary | ICD-10-CM | POA: Diagnosis not present

## 2022-10-06 DIAGNOSIS — R001 Bradycardia, unspecified: Secondary | ICD-10-CM | POA: Diagnosis not present

## 2022-10-06 DIAGNOSIS — H109 Unspecified conjunctivitis: Secondary | ICD-10-CM | POA: Diagnosis not present

## 2022-10-06 DIAGNOSIS — R071 Chest pain on breathing: Secondary | ICD-10-CM | POA: Diagnosis not present

## 2022-10-07 ENCOUNTER — Emergency Department (HOSPITAL_COMMUNITY): Payer: 59

## 2022-10-07 ENCOUNTER — Encounter (HOSPITAL_COMMUNITY): Payer: Self-pay

## 2022-10-07 ENCOUNTER — Other Ambulatory Visit: Payer: Self-pay

## 2022-10-07 ENCOUNTER — Emergency Department (HOSPITAL_COMMUNITY)
Admission: EM | Admit: 2022-10-07 | Discharge: 2022-10-08 | Disposition: A | Discharge status: Home / Self Care | Payer: 59 | Attending: Emergency Medicine | Admitting: Emergency Medicine

## 2022-10-07 DIAGNOSIS — Z79899 Other long term (current) drug therapy: Secondary | ICD-10-CM | POA: Diagnosis not present

## 2022-10-07 DIAGNOSIS — Z7902 Long term (current) use of antithrombotics/antiplatelets: Secondary | ICD-10-CM | POA: Insufficient documentation

## 2022-10-07 DIAGNOSIS — N189 Chronic kidney disease, unspecified: Secondary | ICD-10-CM | POA: Diagnosis not present

## 2022-10-07 DIAGNOSIS — I129 Hypertensive chronic kidney disease with stage 1 through stage 4 chronic kidney disease, or unspecified chronic kidney disease: Secondary | ICD-10-CM | POA: Insufficient documentation

## 2022-10-07 DIAGNOSIS — Z859 Personal history of malignant neoplasm, unspecified: Secondary | ICD-10-CM | POA: Diagnosis not present

## 2022-10-07 DIAGNOSIS — R001 Bradycardia, unspecified: Secondary | ICD-10-CM | POA: Diagnosis not present

## 2022-10-07 DIAGNOSIS — R079 Chest pain, unspecified: Secondary | ICD-10-CM | POA: Diagnosis not present

## 2022-10-07 DIAGNOSIS — J9811 Atelectasis: Secondary | ICD-10-CM | POA: Diagnosis not present

## 2022-10-07 DIAGNOSIS — Z7982 Long term (current) use of aspirin: Secondary | ICD-10-CM | POA: Diagnosis not present

## 2022-10-07 DIAGNOSIS — J449 Chronic obstructive pulmonary disease, unspecified: Secondary | ICD-10-CM | POA: Diagnosis not present

## 2022-10-07 DIAGNOSIS — I1A0 Resistant hypertension: Secondary | ICD-10-CM | POA: Insufficient documentation

## 2022-10-07 DIAGNOSIS — R0789 Other chest pain: Secondary | ICD-10-CM | POA: Diagnosis not present

## 2022-10-07 DIAGNOSIS — I160 Hypertensive urgency: Secondary | ICD-10-CM | POA: Diagnosis not present

## 2022-10-07 DIAGNOSIS — I2510 Atherosclerotic heart disease of native coronary artery without angina pectoris: Secondary | ICD-10-CM | POA: Diagnosis not present

## 2022-10-07 DIAGNOSIS — I517 Cardiomegaly: Secondary | ICD-10-CM | POA: Diagnosis not present

## 2022-10-07 LAB — CBC
HCT: 54.4 % — ABNORMAL HIGH (ref 36.0–46.0)
Hemoglobin: 17.6 g/dL — ABNORMAL HIGH (ref 12.0–15.0)
MCH: 30.4 pg (ref 26.0–34.0)
MCHC: 32.4 g/dL (ref 30.0–36.0)
MCV: 94.1 fL (ref 80.0–100.0)
Platelets: 308 10*3/uL (ref 150–400)
RBC: 5.78 MIL/uL — ABNORMAL HIGH (ref 3.87–5.11)
RDW: 15.3 % (ref 11.5–15.5)
WBC: 16.4 10*3/uL — ABNORMAL HIGH (ref 4.0–10.5)
nRBC: 0 % (ref 0.0–0.2)

## 2022-10-07 LAB — BASIC METABOLIC PANEL
Anion gap: 13 (ref 5–15)
BUN: 10 mg/dL (ref 8–23)
CO2: 25 mmol/L (ref 22–32)
Calcium: 9.9 mg/dL (ref 8.9–10.3)
Chloride: 102 mmol/L (ref 98–111)
Creatinine, Ser: 0.85 mg/dL (ref 0.44–1.00)
GFR, Estimated: >60 mL/min (ref >60)
Glucose, Bld: 116 mg/dL — ABNORMAL HIGH (ref 70–99)
Potassium: 3.7 mmol/L (ref 3.5–5.1)
Sodium: 140 mmol/L (ref 135–145)

## 2022-10-07 LAB — TROPONIN I (HIGH SENSITIVITY)
Troponin I (High Sensitivity): 7 ng/L (ref <18)
Troponin I (High Sensitivity): 10 ng/L (ref <18)

## 2022-10-07 MED ORDER — IOHEXOL 350 MG/ML SOLN
75.0000 mL | Freq: Once | INTRAVENOUS | Status: AC | PRN
  Administered 2022-10-07: 75 mL via INTRAVENOUS

## 2022-10-07 MED ORDER — PANTOPRAZOLE SODIUM 40 MG IV SOLR
40.0000 mg | Freq: Once | INTRAVENOUS | Status: AC
  Administered 2022-10-07: 40 mg via INTRAVENOUS
  Filled 2022-10-07: qty 10

## 2022-10-07 MED ORDER — ALUM & MAG HYDROXIDE-SIMETH 200-200-20 MG/5ML PO SUSP
30.0000 mL | Freq: Once | ORAL | Status: AC
  Administered 2022-10-07: 30 mL via ORAL
  Filled 2022-10-07: qty 30

## 2022-10-07 MED ORDER — HYDRALAZINE HCL 20 MG/ML IJ SOLN
10.0000 mg | Freq: Once | INTRAMUSCULAR | Status: AC
  Administered 2022-10-07: 10 mg via INTRAVENOUS
  Filled 2022-10-07: qty 1

## 2022-10-07 MED ORDER — LIDOCAINE VISCOUS HCL 2 % MT SOLN
15.0000 mL | Freq: Once | OROMUCOSAL | Status: AC
  Administered 2022-10-07: 15 mL via ORAL
  Filled 2022-10-07: qty 15

## 2022-10-07 MED ORDER — MORPHINE SULFATE (PF) 4 MG/ML IV SOLN
4.0000 mg | Freq: Once | INTRAVENOUS | Status: AC
  Administered 2022-10-07: 4 mg via INTRAVENOUS
  Filled 2022-10-07: qty 1

## 2022-10-07 NOTE — Discharge Instructions (Addendum)
You have been evaluated for your chest pain.  You were noted to have very elevated blood pressure which sometimes can contribute to chest pain.  It is improving with medication but please follow-up closely with your cardiologist for outpatient evaluation.  A CT scan of the chest today did not show any evidence of blood clot in your lung.  Please return to the ER if your symptoms worsen or if you have other concerns.  Cardiology office will contact you for a close follow-up appointment.

## 2022-10-07 NOTE — ED Provider Notes (Signed)
[place] EMERGENCY DEPARTMENT AT [facility] Provider Note   CSN: [id number] Arrival date & time: 10/07/22  1902     History  Chief Complaint  Patient presents with   Chest Pain    April May is a 67 y.o. female.  The history is provided by the patient and medical records. No language interpreter was used.  Chest Pain    67 year old female significant history of COPD, cancer, CKD, progressive, Crohn's disease, resistant hypertension presenting complaint of chest pain.  Patient report for the past 3 days she has had pain to the left side of her chest.  She described pain as a gnawing pressure sensation, constant, worse when she takes a deep breath with some mild shortness of breath.  Pain is not associate with any fever, chills, body aches, productive cough, hemoptysis, nausea vomiting diarrhea.  She has been taking her home medication as well as taking over-the-counter Tylenol and ibuprofen without any relief.  States pain is moderate in severity.  Pain is not related to exertion.  Pain is nonradiating.  Home Medications Prior to Admission medications   Medication Sig Start Date End Date Taking? Authorizing Provider  albuterol (PROAIR HFA) 108 (90 Base) MCG/ACT inhaler TAKE 2 PUFFS BY MOUTH EVERY 6 HOURS AS NEEDED FOR WHEEZE OR SHORTNESS OF BREATH Patient taking differently: Inhale 2 puffs into the lungs every 6 (six) hours as needed for shortness of breath or wheezing. TAKE 2 PUFFS BY MOUTH EVERY 6 HOURS AS NEEDED FOR WHEEZE OR SHORTNESS OF BREATH 08/01/21   Pincus Sanes, MD  amLODipine (NORVASC) 10 MG tablet TAKE 1 TABLET BY MOUTH DAILY 07/28/22   Etta Grandchild, MD  aspirin EC 81 MG tablet Take 1 tablet (81 mg total) by mouth daily. Swallow whole. 03/20/22 03/20/23  Nada Libman, MD  budesonide (ENTOCORT EC) 3 MG 24 hr capsule Take 3 capsules (9 mg total) by mouth daily. 09/03/22   Meryl Dare, MD  buPROPion (WELLBUTRIN XL) 150 MG 24 hr tablet TAKE 1  TABLET BY MOUTH DAILY 06/01/22   Etta Grandchild, MD  Cholecalciferol (VITAMIN D3) 1.25 MG (50000 UT) capsule TAKE 1 CAPSULE BY MOUTH ONE TIME PER WEEK 07/18/22   Etta Grandchild, MD  clonazePAM (KLONOPIN) 0.5 MG tablet Take 1 tablet (0.5 mg total) by mouth 2 (two) times daily as needed for anxiety. Patient taking differently: Take 0.5 mg by mouth 2 (two) times daily as needed for anxiety. 10/30/21   Etta Grandchild, MD  clopidogrel (PLAVIX) 75 MG tablet Take 1 tablet (75 mg total) by mouth daily. 03/20/22   Nada Libman, MD  [employer] PAIN RELIEF EXTRA STRENGTH 500 MG tablet TAKE 1 TABLET BY MOUTH EVERY 8 HOURS AS NEEDED FOR MODERATE PAIN Patient taking differently: Take 500-1,000 mg by mouth every 8 (eight) hours as needed for moderate pain. 08/25/18   Etta Grandchild, MD  Cyanocobalamin (B-12 COMPLIANCE INJECTION) 1000 MCG/ML KIT Inject as directed every 30 (thirty) days.    [provider]  ezetimibe (ZETIA) 10 MG tablet TAKE 1 TABLET BY MOUTH DAILY 02/14/22   Jake Bathe, MD  fluticasone (FLONASE) 50 MCG/ACT nasal spray Place 2 sprays into both nostrils daily. Patient taking differently: Place 2 sprays into both nostrils daily as needed for allergies. 12/23/19   Etta Grandchild, MD  lamoTRIgine (LAMICTAL) 200 MG tablet Take 1 tablet (200 mg total) by mouth at bedtime. 02/22/22   Pincus Sanes, MD  nebivolol (  BYSTOLIC) 10 MG tablet TAKE 1 TABLET BY MOUTH DAILY 06/01/22   Etta Grandchild, MD  neomycin-bacitracin-polymyxin [phone] OINT Apply 1 Application topically daily. Right foot abrasion    [provider]  nitroGLYCERIN (NITRO-BID) 2 % ointment Apply 0.5 inches topically 3 (three) times daily. Patient taking differently: Apply 0.5 inches topically 3 (three) times daily. Per pt applies to right great toe 05/02/22   Candelaria Stagers, DPM  oxyCODONE (OXY IR/ROXICODONE) 5 MG immediate release tablet Take 1 tablet (5 mg total) by mouth every 6 (six) hours as needed for moderate pain,  severe pain or breakthrough pain. 05/18/22   Karie Soda, MD  Potassium Citrate 15 MEQ (1620 MG) TBCR TAKE 1 TABLET BY MOUTH IN THE  MORNING AT NOON, AND AT BEDTIME 09/06/22   Etta Grandchild, MD  spironolactone (ALDACTONE) 100 MG tablet TAKE 1 TABLET BY MOUTH EVERY DAY 09/28/22   Etta Grandchild, MD  STIOLTO RESPIMAT 2.5-2.5 MCG/ACT AERS USE 2 INHALATIONS BY MOUTH  DAILY Patient taking differently: Inhale 2 each into the lungs daily as needed. 12/21/21   Etta Grandchild, MD      Allergies    Azathioprine, Crestor [rosuvastatin calcium], Lipitor [atorvastatin calcium], Semaglutide, Nsaids, and Lisinopril    Review of Systems   Review of Systems  Cardiovascular:  Positive for chest pain.  All other systems reviewed and are negative.   Physical Exam Updated Vital Signs BP (!) 220/86 (BP Location: Right Arm)   Pulse (!) 47   Temp 98.3 F (36.8 C)   Resp 17   Ht 5' (1.524 m)   Wt 99.8 kg   LMP 05/14/1988   SpO2 100%   BMI 42.97 kg/m  Physical Exam Vitals and nursing note reviewed.  Constitutional:      General: She is not in acute distress.    Appearance: She is well-developed. She is obese.  HENT:     Head: Atraumatic.  Eyes:     Conjunctiva/sclera: Conjunctivae normal.  Cardiovascular:     Rate and Rhythm: Bradycardia present.     Pulses: Normal pulses.     Heart sounds: Normal heart sounds.  Pulmonary:     Effort: Pulmonary effort is normal.  Abdominal:     Palpations: Abdomen is soft.     Tenderness: There is no abdominal tenderness.  Musculoskeletal:     Cervical back: Neck supple.     Right lower leg: No edema.     Left lower leg: No edema.  Skin:    Findings: No rash.  Neurological:     Mental Status: She is alert.  Psychiatric:        Mood and Affect: Mood normal.     ED Results / Procedures / Treatments   Labs (all labs ordered are listed, but only abnormal results are displayed) Labs Reviewed  BASIC METABOLIC PANEL - Abnormal; Notable for the  following components:      Result Value   Glucose, Bld 116 (*)    All other components within normal limits  CBC - Abnormal; Notable for the following components:   WBC 16.4 (*)    RBC 5.78 (*)    Hemoglobin 17.6 (*)    HCT 54.4 (*)    All other components within normal limits  TROPONIN I (HIGH SENSITIVITY)  TROPONIN I (HIGH SENSITIVITY)    EKG None ED ECG REPORT   Date: 10/07/2022  Rate: 57  Rhythm: sinus bradycardia  QRS Axis: left  Intervals: normal  ST/T  Wave abnormalities: nonspecific ST/T changes  Conduction Disutrbances:none  Narrative Interpretation:   Old EKG Reviewed: unchanged  I have personally reviewed the EKG tracing and agree with the computerized printout as noted.   Radiology CT Angio Chest PE W and/or Wo Contrast  Result Date: 10/07/2022 CLINICAL DATA:  Pulmonary embolism suspected, high probability. EXAM: CT ANGIOGRAPHY CHEST WITH CONTRAST TECHNIQUE: Multidetector CT imaging of the chest was performed using the standard protocol during bolus administration of intravenous contrast. Multiplanar CT image reconstructions and MIPs were obtained to evaluate the vascular anatomy. RADIATION DOSE REDUCTION: This exam was performed according to the departmental dose-optimization program which includes automated exposure control, adjustment of the mA and/or kV according to patient size and/or use of iterative reconstruction technique. CONTRAST:  75mL OMNIPAQUE IOHEXOL 350 MG/ML SOLN COMPARISON:  09/14/2022. FINDINGS: Cardiovascular: Heart is mildly enlarged and there is no pericardial effusion. Scattered coronary artery calcifications are noted. There is atherosclerotic calcification of the aorta without evidence of aneurysm. The pulmonary trunk is normal in caliber. No pulmonary embolism is seen. Examination is limited due to mixing artifact. Mediastinum/Nodes: No mediastinal, hilar, or axillary lymphadenopathy. The thyroid gland, trachea, and esophagus are within normal  limits. There is a small hiatal hernia. Lungs/Pleura: Mild atelectasis is present bilaterally. No effusion or pneumothorax. Upper Abdomen: No acute abnormality. Musculoskeletal: Degenerative changes are present in the thoracic spine. No acute osseous abnormality. Review of the MIP images confirms the above findings. IMPRESSION: 1. No evidence of pulmonary embolism or other acute process. 2. Coronary artery calcifications. 3. Aortic atherosclerosis. Electronically Signed   By: Thornell Sartorius M.D.   On: 10/07/2022 23:33   DG Chest 2 View  Result Date: 10/07/2022 CLINICAL DATA:  Chest pain EXAM: CHEST - 2 VIEW COMPARISON:  09/11/2017 FINDINGS: The heart size and mediastinal contours are within normal limits. Both lungs are clear. The visualized skeletal structures are unremarkable. IMPRESSION: No active cardiopulmonary disease. Electronically Signed   By: Charlett Nose M.D.   On: 10/07/2022 20:01    Procedures Procedures    Medications Ordered in ED Medications  morphine (PF) 4 MG/ML injection 4 mg (4 mg Intravenous Given 10/07/22 2221)  hydrALAZINE (APRESOLINE) injection 10 mg (10 mg Intravenous Given 10/07/22 2239)  iohexol (OMNIPAQUE) 350 MG/ML injection 75 mL (75 mLs Intravenous Contrast Given 10/07/22 2322)    ED Course/ Medical Decision Making/ A&P                             Medical Decision Making Amount and/or Complexity of Data Reviewed Labs: ordered. Radiology: ordered.  Risk Prescription drug management.   BP (!) 220/86 (BP Location: Right Arm)   Pulse (!) 47   Temp 98.3 F (36.8 C)   Resp 17   Ht 5' (1.524 m)   Wt 99.8 kg   LMP 05/14/1988   SpO2 100%   BMI 42.97 kg/m   48:6 PM  67 year old female significant history of COPD, cancer, CKD, progressive, Crohn's disease, resistant hypertension presenting complaint of chest pain.  Patient report for the past 3 days she has had pain to the left side of her chest.  She described pain as a gnawing pressure sensation, constant,  worse when she takes a deep breath with some mild shortness of breath.  Pain is not associate with any fever, chills, body aches, productive cough, hemoptysis, nausea vomiting diarrhea.  She has been taking her home medication as well as taking over-the-counter Tylenol and ibuprofen  without any relief.  States pain is moderate in severity.  Pain is not related to exertion.  Pain is nonradiating.  On exam this is an obese female resting comfortably in bed appears to be in no acute discomfort.  Heart with bradycardia, lungs otherwise clear to auscultation bilaterally abdomen is soft nontender.  Patient does point out a lesion on her breast at the 5 o'clock position proximal to areolar region, approximately 1 cm in diameter with mild surrounding erythema but no fluctuant noted.  It does not involve the areola region.  Vital signs notable for markedly elevated blood pressure of 220/86.  Patient is bradycardic with heart rate of 47.  She is afebrile no hypoxia.  Patient reports she took her usual medication which includes amlodipine and Bystolic.  She is also on Plavix.  -Labs ordered, independently viewed and interpreted by me.  Labs remarkable for WBC 16.4.  no obvious source of infection identify.  Pt is afebrile.  Negative delta trop, doubt ACS.  Electrolytes panel are reassuring.  -The patient was maintained on a cardiac monitor.  I personally viewed and interpreted the cardiac monitored which showed an underlying rhythm of: Sinus bradycardia -Imaging independently viewed and interpreted by me and I agree with radiologist's interpretation.  Result remarkable for CXR without concerning changes -This patient presents to the ED for concern of chest pain, this involves an extensive number of treatment options, and is a complaint that carries with it a high risk of complications and morbidity.  The differential diagnosis includes ACS, PE, PTX, dissection, costochondritis, GERD, Shingles, PNA.  -Co morbidities  that complicate the patient evaluation includes HTN, COPD, CAD, CKD, OSA -Treatment includes morphine, hydralazine -Reevaluation of the patient after these medicines showed that the patient improved -PCP office notes or outside notes reviewed -Discussion with attending Dr. Rubin Payor -Escalation to admission/observation considered: patients feels much better, is comfortable with discharge, and will follow up with PCP -Prescription medication considered, patient comfortable with home medication -Social Determinant of Health considered   11:39 PM Patient with negative delta troponin, have low suspicion for ACS causing her symptoms.  Her HEART score is 3.  A chest CT angiogram obtained without any evidence of PE or other acute process.  Patient initially has elevated blood pressure of 220 systolic however after receiving 10 mg of hydralazine, blood pressure did improve to 183/79.  Suspect some of her chest discomfort may be due to elevated blood pressure but at this time no acute emergent medical condition identified.  Encourage patient to follow-up closely with cardiology, Dr. Anne Fu for outpatient management.  Encourage patient to stay compliant with her medication, return precaution given.         Final Clinical Impression(s) / ED Diagnoses Final diagnoses:  Chest pain, unspecified type  Hypertensive urgency    Rx / DC Orders ED Discharge Orders          Ordered    Ambulatory referral to Cardiology       Comments: If you have not heard from the Cardiology office within the next 72 hours please call [phone].   10/07/22 2341              Fayrene Helper, PA-C 10/07/22 Adin Hector    Benjiman Core, MD 10/08/22 1447

## 2022-10-07 NOTE — ED Notes (Signed)
2nd RN attempt to obtain IV access unsuccessful

## 2022-10-07 NOTE — ED Triage Notes (Signed)
Pt reports left sided chest pain, worse with inspiration, onset yesterday. She reports the pain is progressively getting worse; associated with shortness of breath and nausea, denies vomiting/dizziness.

## 2022-10-08 LAB — HEPATIC FUNCTION PANEL
ALT: 19 U/L (ref 0–44)
AST: 23 U/L (ref 15–41)
Albumin: 4.1 g/dL (ref 3.5–5.0)
Alkaline Phosphatase: 66 U/L (ref 38–126)
Bilirubin, Direct: <0.1 mg/dL (ref 0.0–0.2)
Total Bilirubin: 1.3 mg/dL — ABNORMAL HIGH (ref 0.3–1.2)
Total Protein: 8.2 g/dL — ABNORMAL HIGH (ref 6.5–8.1)

## 2022-10-08 MED ORDER — FAMOTIDINE 20 MG PO TABS: 20.0000 mg | ORAL_TABLET | Freq: Two times a day (BID) | ORAL | 0 refills | Status: DC

## 2022-10-15 ENCOUNTER — Ambulatory Visit: Payer: Self-pay | Admitting: Surgery

## 2022-10-15 ENCOUNTER — Telehealth: Payer: Self-pay

## 2022-10-15 DIAGNOSIS — N6081 Other benign mammary dysplasias of right breast: Secondary | ICD-10-CM | POA: Diagnosis not present

## 2022-10-15 DIAGNOSIS — N6082 Other benign mammary dysplasias of left breast: Secondary | ICD-10-CM | POA: Diagnosis not present

## 2022-10-15 NOTE — Telephone Encounter (Signed)
   Name: April May  DOB: December 24, 1955  MRN: [id number]  Primary Cardiologist: None   Preoperative team, please contact this patient and set up a phone call appointment for further preoperative risk assessment. Please obtain consent and complete medication review. Thank you for your help.  I confirm that guidance regarding antiplatelet and oral anticoagulation therapy has been completed and, if necessary, noted below.  None requested.    Carlos Levering, NP 10/15/2022, 2:56 PM [facility]

## 2022-10-15 NOTE — Telephone Encounter (Signed)
   Pre-operative Risk Assessment    Patient Name: April May  DOB: Aug 10, 1955 MRN: [id number]     Request for Surgical Clearance    Procedure:  e/o breast cyst surgery  Date of Surgery:  Clearance TBD                                 Surgeon:  Dr. Harriette Bouillon Surgeon's Group or Practice Name:  [facility]  Phone number:  [phone] Fax number:  [phone]   Type of Clearance Requested:   - Medical    Type of Anesthesia:  General    Additional requests/questions:    SignedVernard Gambles   10/15/2022, 10:07 AM

## 2022-10-15 NOTE — Telephone Encounter (Signed)
I s/w the pt about pre op clearance appt. Pt states she did not want a tele appt, wanted to move in office up and with Dr. Anne Fu. Pt asked to cancel the July appt with Eligha Bridegroom, NP 11/13/22 and just f/u sooner and have her pre op clearance all in one appt. Pt is grateful for the help. I will update all parties involved.

## 2022-10-16 ENCOUNTER — Telehealth: Payer: Self-pay

## 2022-10-16 NOTE — Telephone Encounter (Signed)
Transition Care Management Unsuccessful Follow-up Telephone Call  Date of discharge and from where:  10/08/2022 [facility]  Attempts:  1st Attempt  Reason for unsuccessful TCM follow-up call:  Left voice message  Margueritte Guthridge [facility]  [facility] Population Health Community Resource Care Guide   [email or web address]  ?? [phone]   Website: [email or web address]  [email or web address]

## 2022-10-16 NOTE — Telephone Encounter (Signed)
Transition Care Management Unsuccessful Follow-up Telephone Call  Date of discharge and from where:  10/08/2022 [facility]  Attempts:  2nd Attempt  Reason for unsuccessful TCM follow-up call:  Left voice message  Harrison Zetina [facility]  [facility] Population Health Community Resource Care Guide   [email or web address]  ?? [phone]   Website: [email or web address]  [email or web address]

## 2022-10-19 ENCOUNTER — Ambulatory Visit: Payer: 59 | Attending: Cardiology | Admitting: Cardiology

## 2022-10-19 ENCOUNTER — Encounter: Payer: Self-pay | Admitting: *Deleted

## 2022-10-19 VITALS — BP 126/70 | HR 54 | Ht 63.0 in | Wt 216.4 lb

## 2022-10-19 DIAGNOSIS — R079 Chest pain, unspecified: Secondary | ICD-10-CM | POA: Diagnosis not present

## 2022-10-19 DIAGNOSIS — Z01818 Encounter for other preprocedural examination: Secondary | ICD-10-CM | POA: Diagnosis not present

## 2022-10-19 NOTE — Patient Instructions (Signed)
Medication Instructions:  The current medical regimen is effective;  continue present plan and medications.  *If you need a refill on your cardiac medications before your next appointment, please call your pharmacy*   Testing/Procedures: Your physician has requested that you have a lexiscan myoview. For further information please visit [email or web address]. Please follow instruction sheet, as given.   Follow-Up: At [facility], you and your health needs are our priority.  As part of our continuing mission to provide you with exceptional heart care, we have created designated Provider Care Teams.  These Care Teams include your primary Cardiologist (physician) and Advanced Practice Providers (APPs -  Physician Assistants and Nurse Practitioners) who all work together to provide you with the care you need, when you need it.  We recommend signing up for the patient portal called "MyChart".  Sign up information is provided on this After Visit Summary.  MyChart is used to connect with patients for Virtual Visits (Telemedicine).  Patients are able to view lab/test results, encounter notes, upcoming appointments, etc.  Non-urgent messages can be sent to your provider as well.   To learn more about what you can do with MyChart, go to [email or web address].    Your next appointment:   1 year(s)  Provider:    Dr Mark Skains      

## 2022-10-19 NOTE — Progress Notes (Signed)
Cardiology Office Note:    Date:  10/19/2022   ID:  April May, DOB 1955-10-13, MRN [id number]  PCP:  Etta Grandchild, MD   [facility] Providers Cardiologist:  Donato Schultz, MD     Referring MD: Etta Grandchild, MD    History of Present Illness:    April May is a 67 y.o. female here for preoperative evaluation.  Has Crohn's disease difficult to control hypertension with recent ER visit on 10/07/2022 with pain to the left side of her chest gnawing type sensation that felt constant and worse when she took any of breath with some associated mild shortness of breath ER records she tried some Tylenol and ibuprofen without any relief.  Considered moderate in severity.  While at the ER, heart was bradycardic lungs were clear troponins were normal.  Chest CT showed no PE.  Blood pressure was elevated at 183 close to discharge.  Coronary artery disease coronary artery calcifications noted on prior lung cancer screening CT in 2021.  We tried her on rosuvastatin but reported muscle pain atorvastatin also called muscle aches, Livalo as well.  She seemed to tolerate Zetia prior LDL was 45.  Excellent.  Currently she is feeling well with no recurrence of chest discomfort.  Has a breast cyst that needs to be removed surgically.  Has PVD-lower extremity angioplasty performed Dr. Myra Gianotti November 2023  Extensive review of medical records ER notes EKG lab work prior CT scan personally reviewed.  Past Medical History:  Diagnosis Date   Abrasion 05/10/2022   right foot   Anal fistula    Anticoagulated    plavix and asa--- managed by vascular, dr Myra Gianotti   Arthritis    B12 deficiency    CKD (chronic kidney disease), stage I    [place] [facility] --- samantha collins PA (lov in epic/ media tab 02-23-2022)   COPD (chronic obstructive pulmonary disease) (HCC)    Coronary artery calcification seen on CAT scan    cardiologist--- dr Anne Fu;    nuclear stress test  10-29-2017  normal perfusion , nuclear ef 60%;   echo 10-16-2017  ef 55-60%, mild TR   Crohn's disease of both small and large intestine (HCC)    followed by dr stark  (GI   Full dentures    GAD (generalized anxiety disorder)    History of acute pancreatitis    secondary to azathioprine   History of small bowel obstruction 11/24/2014   admission in epic  for crohn's flare w/ sbo and abscess s/p  surgical intervention   History of squamous cell carcinoma of skin 06/11/2017   excision left arm   Hyperlipidemia, mixed    followed by cardiology and pcp;   statin intolerate   MDD (major depressive disorder)    OSA on CPAP    followed by dr Peggye Ley ;  (05-16-2022  per pt uses nightly)   study in epic 01-05-2020  AHI 15.4/hr;  does not use cpap since weight loss 1 and 1/2 years ago   Perianal lesion    Peripheral vascular disease (HCC) 02/2022   vascular--- dr Myra Gianotti;   03-20-2022  s/p  balloon angioplasty right occluded peroneal artery, right anterior tibial artery, right tibioperoneal truck   PONV (postoperative nausea and vomiting)    Pre-diabetes    Resistant hypertension    followed by pcp and cardiology   Swelling of right foot 05/10/2022   Vitamin D deficiency     Past Surgical History:  Procedure Laterality  Date   ABDOMINAL AORTOGRAM W/LOWER EXTREMITY N/A 03/20/2022   Procedure: ABDOMINAL AORTOGRAM W/LOWER EXTREMITY;  Surgeon: Nada Libman, MD;  Location: [facility];  Service: Cardiovascular;  Laterality: N/A;   ABDOMINAL HYSTERECTOMY     age 7;  per pt thinks ovaries were removed   APPENDECTOMY     teen   APPLICATION OF WOUND VAC  11/24/2014   Procedure: APPLICATION OF WOUND VAC;  Surgeon: Manus Rudd, MD;  Location: [facility];  Service: General;;   BOWEL RESECTION N/A 11/24/2014   Procedure: SMALL BOWEL RESECTION;  Surgeon: Manus Rudd, MD;  Location: [facility];  Service: General;  Laterality: N/A;   BREAST EXCISIONAL BIOPSY Right 1972   benign   LAPAROTOMY N/A  11/24/2014   Procedure: EXPLORATORY LAPAROTOMY EXTENSIVE LYSIS OF ADHESIONS SAMLL BOWEL RESECTION RIGHT HEMI COLECTOMY WOUND VAC APPLICATION. ;  Surgeon: Manus Rudd, MD;  Location: [facility];  Service: General;  Laterality: N/A;   LYSIS OF ADHESION  11/24/2014   Procedure: LYSIS OF ADHESION (3 HRS);  Surgeon: Manus Rudd, MD;  Location: [facility];  Service: General;;   PARTIAL COLECTOMY  11/24/2014   Procedure: RIGHT HEMI COLECTOMY;  Surgeon: Manus Rudd, MD;  Location: [facility];  Service: General;;   PERIPHERAL VASCULAR BALLOON ANGIOPLASTY  03/20/2022   Procedure: PERIPHERAL VASCULAR BALLOON ANGIOPLASTY;  Surgeon: Nada Libman, MD;  Location: [facility];  Service: Cardiovascular;;  AT and Peroneal   SMALL INTESTINE SURGERY     age 38  (for crohn's disease)   TONSILLECTOMY     teen    Current Medications: Current Meds  Medication Sig   albuterol (PROAIR HFA) 108 (90 Base) MCG/ACT inhaler TAKE 2 PUFFS BY MOUTH EVERY 6 HOURS AS NEEDED FOR WHEEZE OR SHORTNESS OF BREATH (Patient taking differently: Inhale 2 puffs into the lungs every 6 (six) hours as needed for shortness of breath or wheezing. TAKE 2 PUFFS BY MOUTH EVERY 6 HOURS AS NEEDED FOR WHEEZE OR SHORTNESS OF BREATH)   amLODipine (NORVASC) 10 MG tablet TAKE 1 TABLET BY MOUTH DAILY   aspirin EC 81 MG tablet Take 1 tablet (81 mg total) by mouth daily. Swallow whole.   budesonide (ENTOCORT EC) 3 MG 24 hr capsule Take 3 capsules (9 mg total) by mouth daily.   buPROPion (WELLBUTRIN XL) 150 MG 24 hr tablet TAKE 1 TABLET BY MOUTH DAILY   Cholecalciferol (VITAMIN D3) 1.25 MG (50000 UT) capsule TAKE 1 CAPSULE BY MOUTH ONE TIME PER WEEK   clonazePAM (KLONOPIN) 0.5 MG tablet Take 1 tablet (0.5 mg total) by mouth 2 (two) times daily as needed for anxiety. (Patient taking differently: Take 0.5 mg by mouth 2 (two) times daily as needed for anxiety.)   clopidogrel (PLAVIX) 75 MG tablet Take 1 tablet (75 mg total) by mouth daily.   [employer] PAIN  RELIEF EXTRA STRENGTH 500 MG tablet TAKE 1 TABLET BY MOUTH EVERY 8 HOURS AS NEEDED FOR MODERATE PAIN (Patient taking differently: Take 500-1,000 mg by mouth every 8 (eight) hours as needed for moderate pain.)   Cyanocobalamin (B-12 COMPLIANCE INJECTION) 1000 MCG/ML KIT Inject as directed every 30 (thirty) days.   ezetimibe (ZETIA) 10 MG tablet TAKE 1 TABLET BY MOUTH DAILY   famotidine (PEPCID) 20 MG tablet Take 1 tablet (20 mg total) by mouth 2 (two) times daily.   fluticasone (FLONASE) 50 MCG/ACT nasal spray Place 2 sprays into both nostrils daily. (Patient taking differently: Place 2 sprays into both nostrils daily as needed  for allergies.)   lamoTRIgine (LAMICTAL) 200 MG tablet Take 1 tablet (200 mg total) by mouth at bedtime.   nebivolol (BYSTOLIC) 10 MG tablet TAKE 1 TABLET BY MOUTH DAILY   neomycin-bacitracin-polymyxin [phone] OINT Apply 1 Application topically daily. Right foot abrasion   nitroGLYCERIN (NITRO-BID) 2 % ointment Apply 0.5 inches topically 3 (three) times daily. (Patient taking differently: Apply 0.5 inches topically 3 (three) times daily. Per pt applies to right great toe)   oxyCODONE (OXY IR/ROXICODONE) 5 MG immediate release tablet Take 1 tablet (5 mg total) by mouth every 6 (six) hours as needed for moderate pain, severe pain or breakthrough pain.   Potassium Citrate 15 MEQ (1620 MG) TBCR TAKE 1 TABLET BY MOUTH IN THE  MORNING AT NOON, AND AT BEDTIME   spironolactone (ALDACTONE) 100 MG tablet TAKE 1 TABLET BY MOUTH EVERY DAY   STIOLTO RESPIMAT 2.5-2.5 MCG/ACT AERS USE 2 INHALATIONS BY MOUTH  DAILY (Patient taking differently: Inhale 2 each into the lungs daily as needed.)     Allergies:   Azathioprine, Crestor [rosuvastatin calcium], Lipitor [atorvastatin calcium], Semaglutide, Nsaids, and Lisinopril   Social History   Socioeconomic History   Marital status: Divorced    Spouse name: Not on file   Number of children: 2   Years of education: Not on file   Highest  education level: 12th grade  Occupational History   Occupation: disabled  Tobacco Use   Smoking status: Every Day    Packs/day: 0.75    Years: 48.00    Additional pack years: 0.00    Total pack years: 36.00    Types: Cigarettes   Smokeless tobacco: Never   Tobacco comments:    05-16-2022  per pt started smoking as a teen and trying to quit  Vaping Use   Vaping Use: Never used  Substance and Sexual Activity   Alcohol use: No   Drug use: Never   Sexual activity: Not on file  Other Topics Concern   Not on file  Social History Narrative   Not on file   Social Determinants of Health   Financial Resource Strain: Low Risk  (11/24/2021)   Overall Financial Resource Strain (CARDIA)    Difficulty of Paying Living Expenses: Not hard at all  Food Insecurity: No Food Insecurity (11/24/2021)   Hunger Vital Sign    Worried About Running Out of Food in the Last Year: Never true    Ran Out of Food in the Last Year: Never true  Transportation Needs: No Transportation Needs (11/24/2021)   PRAPARE - Administrator, Civil Service (Medical): No    Lack of Transportation (Non-Medical): No  Physical Activity: Sufficiently Active (11/24/2021)   Exercise Vital Sign    Days of Exercise per Week: 5 days    Minutes of Exercise per Session: 30 min  Stress: No Stress Concern Present (11/24/2021)   [employer] of Occupational Health - Occupational Stress Questionnaire    Feeling of Stress : Not at all  Social Connections: Moderately Integrated (11/24/2021)   Social Connection and Isolation Panel [NHANES]    Frequency of Communication with Friends and Family: More than three times a week    Frequency of Social Gatherings with Friends and Family: Once a week    Attends Religious Services: More than 4 times per year    Active Member of [employer] or Organizations: No    Attends Engineer, structural: More than 4 times per year    Marital Status: Divorced  Family History: The  patient's family history includes Alcohol abuse in her paternal uncle; Arthritis in her father; Brain cancer in her father; Crohn's disease in her mother; Diabetes in her father and paternal grandmother; Heart disease in her mother; Heart failure in her brother and sister; Hypertension in her mother; Kidney cancer in her brother; Kidney disease in her mother and sister; Other in her sister; Stroke in her paternal grandmother; Ulcerative colitis in her mother.  ROS:   Please see the history of present illness.     All other systems reviewed and are negative.  EKGs/Labs/Other Studies Reviewed:    The following studies were reviewed today:  Cardiac Studies & Procedures     STRESS TESTS  MYOCARDIAL PERFUSION IMAGING 10/29/2017  Narrative  Nuclear stress EF: 60%.  There was no ST segment deviation noted during stress.  The study is normal.  The left ventricular ejection fraction is normal (55-65%).  1. EF 60%, normal wall motion. 2. No evidence for ischemia or infarction by ST segment analysis.  Normal study.   ECHOCARDIOGRAM  ECHOCARDIOGRAM COMPLETE 10/16/2017  Narrative *[facility]* [address] [place], [place] [address] [phone]  ------------------------------------------------------------------- Transthoracic Echocardiography  Patient:    April May, April May MR #:       [id number] Study Date: 10/16/2017 Gender:     F Age:        29 Height:     154.9 cm Weight:     100.1 kg BSA:        2.13 m^2 Pt. Status: Room:  ATTENDING    Erasmo Downer REFERRING    Etta Grandchild SONOGRAPHER  Clearence Ped, RCS PERFORMING   [facility], Outpatient  cc:  ------------------------------------------------------------------- LV EF: 55% -   60%  ------------------------------------------------------------------- Indications:      Abnormal ECG  (R94.31).  ------------------------------------------------------------------- History:   PMH:   Dyspnea and murmur.  ------------------------------------------------------------------- Study Conclusions  - Left ventricle: The cavity size was normal. Systolic function was normal. The estimated ejection fraction was in the range of 55% to 60%. Wall motion was normal; there were no regional wall motion abnormalities. Left ventricular diastolic function parameters were normal. - Atrial septum: No defect or patent foramen ovale was identified.  ------------------------------------------------------------------- Study data:  No prior study was available for comparison.  Study status:  Routine.  Procedure:  The patient reported no pain pre or post test. Transthoracic echocardiography. Image quality was adequate.          Transthoracic echocardiography.  M-mode, complete 2D, spectral Doppler, and color Doppler.  Birthdate: Patient birthdate: July 13, 1955.  Age:  Patient is 67 yr old.  Sex: Gender: female.    BMI: 41.7 kg/m^2.  Blood pressure:     140/90 Patient status:  Outpatient.  Study date:  Study date: 10/16/2017. Study time: 08:34 AM.  Location:  [facility]  -------------------------------------------------------------------  ------------------------------------------------------------------- Left ventricle:  The cavity size was normal. Systolic function was normal. The estimated ejection fraction was in the range of 55% to 60%. Wall motion was normal; there were no regional wall motion abnormalities. The transmitral flow pattern was normal. The deceleration time of the early transmitral flow velocity was normal. The pulmonary vein flow pattern was normal. The tissue Doppler parameters were normal. Left ventricular diastolic function parameters were normal.  ------------------------------------------------------------------- Aortic valve:   Trileaflet; mildly thickened  leaflets. Mobility was not restricted.  Doppler:  Transvalvular velocity was within the normal  range. There was no stenosis. There was no regurgitation.  ------------------------------------------------------------------- Aorta:  Aortic root: The aortic root was normal in size.  ------------------------------------------------------------------- Mitral valve:   Mildly thickened leaflets . Mobility was not restricted.  Doppler:  Transvalvular velocity was within the normal range. There was no evidence for stenosis. There was trivial regurgitation.    Peak gradient (D): 3 mm Hg.  ------------------------------------------------------------------- Left atrium:  The atrium was normal in size.  ------------------------------------------------------------------- Atrial septum:  No defect or patent foramen ovale was identified.  ------------------------------------------------------------------- Right ventricle:  The cavity size was normal. Wall thickness was normal. Systolic function was normal.  ------------------------------------------------------------------- Pulmonic valve:    Doppler:  Transvalvular velocity was within the normal range. There was no evidence for stenosis. There was trivial regurgitation.  ------------------------------------------------------------------- Tricuspid valve:   Structurally normal valve.    Doppler: Transvalvular velocity was within the normal range. There was mild regurgitation.  ------------------------------------------------------------------- Pulmonary artery:   The main pulmonary artery was normal-sized. Systolic pressure was within the normal range.  ------------------------------------------------------------------- Right atrium:  The atrium was normal in size.  ------------------------------------------------------------------- Pericardium:  The pericardium was normal in appearance. There was no pericardial  effusion.  ------------------------------------------------------------------- Systemic veins: Inferior vena cava: The vessel was normal in size. The respirophasic diameter changes were in the normal range (= 50%), consistent with normal central venous pressure. Diameter: 14 mm.  ------------------------------------------------------------------- Measurements  IVC                                        Value        Reference ID                                         14    mm     ----------  Left ventricle                             Value        Reference LV ID, ED, PLAX chordal            (L)     42    mm     43 - 52 LV ID, ES, PLAX chordal                    29.8  mm     23 - 38 LV fx shortening, PLAX chordal             29    %      >=29 LV PW thickness, ED                        12.6  mm     ---------- IVS/LV PW ratio, ED                        0.77         <=1.3 Stroke volume, 2D                          85    ml     ---------- Stroke volume/bsa, 2D  40    ml/m^2 ---------- LV e&', lateral                             7.29  cm/s   ---------- LV E/e&', lateral                           11.65        ---------- LV e&', medial                              7.29  cm/s   ---------- LV E/e&', medial                            11.65        ---------- LV e&', average                             7.29  cm/s   ---------- LV E/e&', average                           11.65        ----------  Ventricular septum                         Value        Reference IVS thickness, ED                          9.76  mm     ----------  LVOT                                       Value        Reference LVOT ID, S                                 20    mm     ---------- LVOT area                                  3.14  cm^2   ---------- LVOT peak velocity, S                      107   cm/s   ---------- LVOT mean velocity, S                      74.4  cm/s   ---------- LVOT VTI, S                                 27.2  cm     ----------  Aorta                                      Value        Reference Aortic root ID, ED  30    mm     ----------  Left atrium                                Value        Reference LA ID, A-P, ES                             37    mm     ---------- LA ID/bsa, A-P                             1.73  cm/m^2 <=2.2 LA volume, S                               60.8  ml     ---------- LA volume/bsa, S                           28.5  ml/m^2 ---------- LA volume, ES, 1-p A4C                     58.6  ml     ---------- LA volume/bsa, ES, 1-p A4C                 27.5  ml/m^2 ---------- LA volume, ES, 1-p A2C                     59.8  ml     ---------- LA volume/bsa, ES, 1-p A2C                 28    ml/m^2 ----------  Mitral valve                               Value        Reference Mitral E-wave peak velocity                84.9  cm/s   ---------- Mitral A-wave peak velocity                82.9  cm/s   ---------- Mitral deceleration time           (H)     303   ms     150 - 230 Mitral peak gradient, D                    3     mm Hg  ---------- Mitral E/A ratio, peak                     1            ----------  Pulmonary arteries                         Value        Reference PA pressure, S, DP                         10    mm Hg  <=30  Tricuspid valve  Value        Reference Tricuspid regurg peak velocity             128   cm/s   ---------- Tricuspid peak RV-RA gradient              7     mm Hg  ---------- Tricuspid maximal regurg velocity,         128   cm/s   ---------- PISA  Right atrium                               Value        Reference RA ID, S-I, ES, A4C                        45.9  mm     34 - 49 RA area, ES, A4C                           14.4  cm^2   8.3 - 19.5 RA volume, ES, A/L                         38    ml     ---------- RA volume/bsa, ES, A/L                     17.8  ml/m^2  ----------  Systemic veins                             Value        Reference Estimated CVP                              3     mm Hg  ----------  Right ventricle                            Value        Reference TAPSE                                      25.3  mm     ---------- RV pressure, S, DP                         10    mm Hg  <=30 RV s&', lateral, S                          11.5  cm/s   ----------  Legend: (L)  and  (H)  Pratt Bress values outside specified reference range.  ------------------------------------------------------------------- Prepared and Electronically Authenticated by  Charlton Haws, M.D. 2019-06-05T09:23:13              EKG: 10/07/2022-sinus bradycardia 57 nonspecific ST changes.  Recent Labs: 10/30/2021: Magnesium 1.9 10/07/2022: ALT 19; BUN 10; Creatinine, Ser 0.85; Hemoglobin 17.6; Platelets 308; Potassium 3.7; Sodium 140  Recent Lipid Panel    Component Value Date/Time   CHOL 109 06/21/2021 1044   TRIG 77.0 06/21/2021 1044   HDL 48.70 06/21/2021 1044   CHOLHDL 2 06/21/2021  1044   VLDL 15.4 06/21/2021 1044   LDLCALC 45 06/21/2021 1044     Risk Assessment/Calculations:               Physical Exam:    VS:  BP 126/70   Pulse (!) 54   Ht 5\' 3"  (1.6 m)   Wt 216 lb 6.4 oz (98.2 kg)   LMP 05/14/1988   SpO2 95%   BMI 38.33 kg/m     Wt Readings from Last 3 Encounters:  10/19/22 216 lb 6.4 oz (98.2 kg)  10/07/22 220 lb (99.8 kg)  08/29/22 219 lb (99.3 kg)     GEN:  Well nourished, well developed in no acute distress HEENT: Normal NECK: No JVD; No carotid bruits LYMPHATICS: No lymphadenopathy CARDIAC: RRR, no murmurs, rubs, gallops RESPIRATORY:  Clear to auscultation without rales, wheezing or rhonchi  ABDOMEN: Soft, non-tender, non-distended MUSCULOSKELETAL:  No edema; No deformity  SKIN: Warm and dry NEUROLOGIC:  Alert and oriented x 3 PSYCHIATRIC:  Normal affect   ASSESSMENT:    1. Pre-operative clearance   2. Chest pain of  uncertain etiology    PLAN:    In order of problems listed above:  Preop evaluation --Has a breast cyst that needs to be removed. -Was just in the emergency department with chest pain.  Has known coronary artery disease with coronary calcification seen on prior CT scan.-We will go ahead and proceed with pharmacologic stress test to ensure that she does not have any high risk ischemia present.  If testing is reassuring, she may proceed with breast procedure.  Coronary artery disease - Continue encourage secondary risk factor prevention.  She is currently on Zetia.  Baseline LDL has been excellent in the 50s.  No changes made.  Did not tolerate statins.  Hypertension - Better control today.  Was elevated in the emergency department.  Improved.  Continue with current medication management.  Peripheral arterial disease - Dr. Myra Gianotti has performed procedure lower extremity.  On Plavix and aspirin.  If these medications need to be held prior to surgery, please reach out to Dr. Myra Gianotti for his opinion on timing.  The procedure was performed in November 2023.  It should be reasonable to hold antiplatelets at this point.           1.  Ultrasound-guided access, left femoral artery            2.  Abdominal aortogram            3.  Right lower extremity runoff            4.  Angioplasty, right anterior tibial artery            5.  Angioplasty, right tibioperoneal trunk            6.  Angioplasty right peroneal artery            7.  Conscious sedation, 90 minutes             8.  Closure device, Celt         Shared Decision Making/Informed Consent The risks [chest pain, shortness of breath, cardiac arrhythmias, dizziness, blood pressure fluctuations, myocardial infarction, stroke/transient ischemic attack, nausea, vomiting, allergic reaction, radiation exposure, metallic taste sensation and life-threatening complications (estimated to be 1 in 10,000)], benefits (risk stratification, diagnosing  coronary artery disease, treatment guidance) and alternatives of a nuclear stress test were discussed in detail with April May and she agrees to proceed.  Medication Adjustments/Labs and Tests Ordered: Current medicines are reviewed at length with the patient today.  Concerns regarding medicines are outlined above.  Orders Placed This Encounter  Procedures   Cardiac Stress Test: Informed Consent Details: Physician/Practitioner Attestation; Transcribe to consent form and obtain patient signature   MYOCARDIAL PERFUSION IMAGING   No orders of the defined types were placed in this encounter.   Patient Instructions  Medication Instructions:  The current medical regimen is effective;  continue present plan and medications.  *If you need a refill on your cardiac medications before your next appointment, please call your pharmacy*   Testing/Procedures: Your physician has requested that you have a lexiscan myoview. For further information please visit [email or web address] Please follow instruction sheet, as given.   Follow-Up: At [facility], you and your health needs are our priority.  As part of our continuing mission to provide you with exceptional heart care, we have created designated Provider Care Teams.  These Care Teams include your primary Cardiologist (physician) and Advanced Practice Providers (APPs -  Physician Assistants and Nurse Practitioners) who all work together to provide you with the care you need, when you need it.  We recommend signing up for the patient portal called "MyChart".  Sign up information is provided on this After Visit Summary.  MyChart is used to connect with patients for Virtual Visits (Telemedicine).  Patients are able to view lab/test results, encounter notes, upcoming appointments, etc.  Non-urgent messages can be sent to your provider as well.   To learn more about what you can do with MyChart, go to [email or web address].    Your next  appointment:   1 year(s)  Provider:   Dr Donato Schultz       Signed, Donato Schultz, MD  10/19/2022 9:35 AM     [facility]

## 2022-10-24 ENCOUNTER — Ambulatory Visit: Payer: 59

## 2022-10-24 ENCOUNTER — Ambulatory Visit (INDEPENDENT_AMBULATORY_CARE_PROVIDER_SITE_OTHER): Payer: 59 | Admitting: Internal Medicine

## 2022-10-24 ENCOUNTER — Encounter: Payer: Self-pay | Admitting: Internal Medicine

## 2022-10-24 VITALS — BP 132/74 | HR 47 | Temp 98.1°F | Ht 63.0 in | Wt 219.0 lb

## 2022-10-24 DIAGNOSIS — Z0000 Encounter for general adult medical examination without abnormal findings: Secondary | ICD-10-CM | POA: Diagnosis not present

## 2022-10-24 DIAGNOSIS — E538 Deficiency of other specified B group vitamins: Secondary | ICD-10-CM

## 2022-10-24 DIAGNOSIS — I739 Peripheral vascular disease, unspecified: Secondary | ICD-10-CM | POA: Diagnosis not present

## 2022-10-24 DIAGNOSIS — I10 Essential (primary) hypertension: Secondary | ICD-10-CM | POA: Diagnosis not present

## 2022-10-24 DIAGNOSIS — Z0001 Encounter for general adult medical examination with abnormal findings: Secondary | ICD-10-CM

## 2022-10-24 DIAGNOSIS — E876 Hypokalemia: Secondary | ICD-10-CM | POA: Diagnosis not present

## 2022-10-24 DIAGNOSIS — T502X5A Adverse effect of carbonic-anhydrase inhibitors, benzothiadiazides and other diuretics, initial encounter: Secondary | ICD-10-CM

## 2022-10-24 DIAGNOSIS — E785 Hyperlipidemia, unspecified: Secondary | ICD-10-CM

## 2022-10-24 DIAGNOSIS — T466X5A Adverse effect of antihyperlipidemic and antiarteriosclerotic drugs, initial encounter: Secondary | ICD-10-CM | POA: Diagnosis not present

## 2022-10-24 DIAGNOSIS — R7303 Prediabetes: Secondary | ICD-10-CM | POA: Diagnosis not present

## 2022-10-24 DIAGNOSIS — F17200 Nicotine dependence, unspecified, uncomplicated: Secondary | ICD-10-CM

## 2022-10-24 DIAGNOSIS — F411 Generalized anxiety disorder: Secondary | ICD-10-CM

## 2022-10-24 DIAGNOSIS — E559 Vitamin D deficiency, unspecified: Secondary | ICD-10-CM

## 2022-10-24 DIAGNOSIS — F321 Major depressive disorder, single episode, moderate: Secondary | ICD-10-CM

## 2022-10-24 DIAGNOSIS — G720 Drug-induced myopathy: Secondary | ICD-10-CM | POA: Diagnosis not present

## 2022-10-24 LAB — CK: Total CK: 32 U/L (ref 7–177)

## 2022-10-24 LAB — LIPID PANEL
Cholesterol: 123 mg/dL (ref 0–200)
HDL: 55 mg/dL (ref >39.00)
LDL Cholesterol: 48 mg/dL (ref 0–99)
NonHDL: 68.23
Total CHOL/HDL Ratio: 2
Triglycerides: 100 mg/dL (ref 0.0–149.0)
VLDL: 20 mg/dL (ref 0.0–40.0)

## 2022-10-24 LAB — URINALYSIS, ROUTINE W REFLEX MICROSCOPIC
Bilirubin Urine: NEGATIVE
Hgb urine dipstick: NEGATIVE
Ketones, ur: NEGATIVE
Leukocytes,Ua: NEGATIVE
Nitrite: NEGATIVE
RBC / HPF: NONE SEEN (ref 0–?)
Specific Gravity, Urine: 1.02 (ref 1.000–1.030)
Total Protein, Urine: NEGATIVE
Urine Glucose: NEGATIVE
Urobilinogen, UA: 0.2 (ref 0.0–1.0)
WBC, UA: NONE SEEN (ref 0–?)
pH: 6 (ref 5.0–8.0)

## 2022-10-24 LAB — TSH: TSH: 2.97 u[IU]/mL (ref 0.35–5.50)

## 2022-10-24 LAB — FOLATE: Folate: 10.1 ng/mL (ref >5.9)

## 2022-10-24 LAB — HEMOGLOBIN A1C: Hgb A1c MFr Bld: 5.8 % (ref 4.6–6.5)

## 2022-10-24 LAB — MICROALBUMIN / CREATININE URINE RATIO
Creatinine,U: 115.1 mg/dL
Microalb Creat Ratio: 0.6 mg/g (ref 0.0–30.0)
Microalb, Ur: <0.7 mg/dL (ref 0.0–1.9)

## 2022-10-24 MED ORDER — VARENICLINE TARTRATE (STARTER) 0.5 MG X 11 & 1 MG X 42 PO TBPK
ORAL_TABLET | ORAL | 0 refills | Status: DC
Start: 2022-10-24 — End: 2023-05-21

## 2022-10-24 MED ORDER — CYANOCOBALAMIN 1000 MCG/ML IJ SOLN
1000.0000 ug | Freq: Once | INTRAMUSCULAR | Status: AC
Start: 2022-10-24 — End: 2022-10-24
  Administered 2022-10-24: 1000 ug via INTRAMUSCULAR

## 2022-10-24 MED ORDER — PITAVASTATIN CALCIUM 1 MG PO TABS
1.0000 mg | ORAL_TABLET | Freq: Every day | ORAL | 1 refills | Status: DC
Start: 2022-10-24 — End: 2023-05-21

## 2022-10-24 MED ORDER — VARENICLINE TARTRATE 1 MG PO TABS
1.0000 mg | ORAL_TABLET | Freq: Two times a day (BID) | ORAL | 3 refills | Status: DC
Start: 2022-10-24 — End: 2023-05-21

## 2022-10-24 MED ORDER — SPIRONOLACTONE 100 MG PO TABS: 100.0000 mg | ORAL_TABLET | Freq: Every day | ORAL | 0 refills | Status: DC

## 2022-10-24 NOTE — Patient Instructions (Signed)

## 2022-10-24 NOTE — Progress Notes (Signed)
Subjective:  Patient ID: April May, female    DOB: 1956/04/20  Age: 67 y.o. MRN: [id number]  CC: Annual Exam, Hypertension, and Hyperlipidemia   HPI April May presents for a CPX and f/up ---  She is active and denies chest pain, shortness of breath, diaphoresis, or edema.  Outpatient Medications Prior to Visit  Medication Sig Dispense Refill   albuterol (PROAIR HFA) 108 (90 Base) MCG/ACT inhaler TAKE 2 PUFFS BY MOUTH EVERY 6 HOURS AS NEEDED FOR WHEEZE OR SHORTNESS OF BREATH (Patient taking differently: Inhale 2 puffs into the lungs every 6 (six) hours as needed for shortness of breath or wheezing. TAKE 2 PUFFS BY MOUTH EVERY 6 HOURS AS NEEDED FOR WHEEZE OR SHORTNESS OF BREATH) 18 g 5   amLODipine (NORVASC) 10 MG tablet TAKE 1 TABLET BY MOUTH DAILY 100 tablet 0   aspirin EC 81 MG tablet Take 1 tablet (81 mg total) by mouth daily. Swallow whole. 150 tablet 2   budesonide (ENTOCORT EC) 3 MG 24 hr capsule Take 3 capsules (9 mg total) by mouth daily. 270 capsule 0   buPROPion (WELLBUTRIN XL) 150 MG 24 hr tablet TAKE 1 TABLET BY MOUTH DAILY 100 tablet 1   Cholecalciferol (VITAMIN D3) 1.25 MG (50000 UT) capsule TAKE 1 CAPSULE BY MOUTH ONE TIME PER WEEK 12 capsule 0   clopidogrel (PLAVIX) 75 MG tablet Take 1 tablet (75 mg total) by mouth daily. 30 tablet 11   [employer] PAIN RELIEF EXTRA STRENGTH 500 MG tablet TAKE 1 TABLET BY MOUTH EVERY 8 HOURS AS NEEDED FOR MODERATE PAIN (Patient taking differently: Take 500-1,000 mg by mouth every 8 (eight) hours as needed for moderate pain.) 60 tablet 5   Cyanocobalamin (B-12 COMPLIANCE INJECTION) 1000 MCG/ML KIT Inject as directed every 30 (thirty) days.     ezetimibe (ZETIA) 10 MG tablet TAKE 1 TABLET BY MOUTH DAILY 60 tablet 5   famotidine (PEPCID) 20 MG tablet Take 1 tablet (20 mg total) by mouth 2 (two) times daily. 30 tablet 0   fluticasone (FLONASE) 50 MCG/ACT nasal spray Place 2 sprays into both nostrils daily. (Patient taking  differently: Place 2 sprays into both nostrils daily as needed for allergies.) 48 g 3   lamoTRIgine (LAMICTAL) 200 MG tablet Take 1 tablet (200 mg total) by mouth at bedtime. 100 tablet 1   nitroGLYCERIN (NITRO-BID) 2 % ointment Apply 0.5 inches topically 3 (three) times daily. (Patient taking differently: Apply 0.5 inches topically 3 (three) times daily. Per pt applies to right great toe) 30 g 1   Potassium Citrate 15 MEQ (1620 MG) TBCR TAKE 1 TABLET BY MOUTH IN THE  MORNING AT NOON, AND AT BEDTIME 300 tablet 0   STIOLTO RESPIMAT 2.5-2.5 MCG/ACT AERS USE 2 INHALATIONS BY MOUTH  DAILY (Patient taking differently: Inhale 2 each into the lungs daily as needed.) 12 g 1   nebivolol (BYSTOLIC) 10 MG tablet TAKE 1 TABLET BY MOUTH DAILY 100 tablet 1   neomycin-bacitracin-polymyxin [phone] OINT Apply 1 Application topically daily. Right foot abrasion     oxyCODONE (OXY IR/ROXICODONE) 5 MG immediate release tablet Take 1 tablet (5 mg total) by mouth every 6 (six) hours as needed for moderate pain, severe pain or breakthrough pain. 30 tablet 0   spironolactone (ALDACTONE) 100 MG tablet TAKE 1 TABLET BY MOUTH EVERY DAY 90 tablet 0   clonazePAM (KLONOPIN) 0.5 MG tablet Take 1 tablet (0.5 mg total) by mouth 2 (two) times daily as needed for anxiety. (Patient  taking differently: Take 0.5 mg by mouth 2 (two) times daily as needed for anxiety.) 60 tablet 3   No facility-administered medications prior to visit.    ROS Review of Systems  Constitutional: Negative.  Negative for diaphoresis and fatigue.  HENT: Negative.    Eyes: Negative.   Respiratory:  Positive for shortness of breath. Negative for cough, chest tightness and wheezing.   Cardiovascular:  Negative for chest pain, palpitations and leg swelling.  Gastrointestinal:  Negative for abdominal pain, constipation, diarrhea, nausea and vomiting.  Endocrine: Negative.   Genitourinary: Negative.  Negative for difficulty urinating and dysuria.   Musculoskeletal: Negative.  Negative for arthralgias, joint swelling and myalgias.  Skin: Negative.  Negative for color change and rash.  Neurological:  Negative for dizziness, weakness and light-headedness.  Hematological:  Negative for adenopathy. Does not bruise/bleed easily.  Psychiatric/Behavioral: Negative.      Objective:  BP 132/74 (BP Location: Right Arm, Patient Position: Sitting, Cuff Size: Large)   Pulse (!) 47   Temp 98.1 F (36.7 C) (Oral)   Ht 5\' 3"  (1.6 m)   Wt 219 lb (99.3 kg)   LMP 05/14/1988   SpO2 98%   BMI 38.79 kg/m   BP Readings from Last 3 Encounters:  10/24/22 132/74  10/19/22 126/70  10/08/22 (!) 165/123    Wt Readings from Last 3 Encounters:  10/24/22 219 lb (99.3 kg)  10/19/22 216 lb 6.4 oz (98.2 kg)  10/07/22 220 lb (99.8 kg)    Physical Exam Vitals reviewed.  HENT:     Nose: Nose normal.     Mouth/Throat:     Mouth: Mucous membranes are moist.  Eyes:     General: No scleral icterus.    Conjunctiva/sclera: Conjunctivae normal.  Neck:     Vascular: No carotid bruit.  Cardiovascular:     Rate and Rhythm: Regular rhythm. Bradycardia present.     Heart sounds: Murmur heard.     No friction rub. No gallop.  Pulmonary:     Effort: Pulmonary effort is normal.     Breath sounds: No stridor. No wheezing, rhonchi or rales.  Abdominal:     General: Abdomen is flat.     Palpations: There is no mass.     Tenderness: There is no abdominal tenderness. There is no guarding.     Hernia: No hernia is present.  Musculoskeletal:        General: Normal range of motion.     Cervical back: Neck supple.     Right lower leg: No edema.     Left lower leg: No edema.  Skin:    General: Skin is warm and dry.     Findings: No rash.  Neurological:     General: No focal deficit present.     Mental Status: She is alert. Mental status is at baseline.  Psychiatric:        Mood and Affect: Mood normal.        Behavior: Behavior normal.        Thought  Content: Thought content normal.        Judgment: Judgment normal.     Lab Results  Component Value Date   WBC 16.4 (H) 10/07/2022   HGB 17.6 (H) 10/07/2022   HCT 54.4 (H) 10/07/2022   PLT 308 10/07/2022   GLUCOSE 116 (H) 10/07/2022   CHOL 123 10/24/2022   TRIG 100.0 10/24/2022   HDL 55.00 10/24/2022   LDLCALC 48 10/24/2022   ALT 19 10/07/2022  AST 23 10/07/2022   NA 140 10/07/2022   K 3.7 10/07/2022   CL 102 10/07/2022   CREATININE 0.85 10/07/2022   BUN 10 10/07/2022   CO2 25 10/07/2022   TSH 2.97 10/24/2022   HGBA1C 5.8 10/24/2022   MICROALBUR <0.7 10/24/2022    CT Angio Chest PE W and/or Wo Contrast  Result Date: 10/07/2022 CLINICAL DATA:  Pulmonary embolism suspected, high probability. EXAM: CT ANGIOGRAPHY CHEST WITH CONTRAST TECHNIQUE: Multidetector CT imaging of the chest was performed using the standard protocol during bolus administration of intravenous contrast. Multiplanar CT image reconstructions and MIPs were obtained to evaluate the vascular anatomy. RADIATION DOSE REDUCTION: This exam was performed according to the departmental dose-optimization program which includes automated exposure control, adjustment of the mA and/or kV according to patient size and/or use of iterative reconstruction technique. CONTRAST:  75mL OMNIPAQUE IOHEXOL 350 MG/ML SOLN COMPARISON:  09/14/2022. FINDINGS: Cardiovascular: Heart is mildly enlarged and there is no pericardial effusion. Scattered coronary artery calcifications are noted. There is atherosclerotic calcification of the aorta without evidence of aneurysm. The pulmonary trunk is normal in caliber. No pulmonary embolism is seen. Examination is limited due to mixing artifact. Mediastinum/Nodes: No mediastinal, hilar, or axillary lymphadenopathy. The thyroid gland, trachea, and esophagus are within normal limits. There is a small hiatal hernia. Lungs/Pleura: Mild atelectasis is present bilaterally. No effusion or pneumothorax. Upper  Abdomen: No acute abnormality. Musculoskeletal: Degenerative changes are present in the thoracic spine. No acute osseous abnormality. Review of the MIP images confirms the above findings. IMPRESSION: 1. No evidence of pulmonary embolism or other acute process. 2. Coronary artery calcifications. 3. Aortic atherosclerosis. Electronically Signed   By: Thornell Sartorius M.D.   On: 10/07/2022 23:33   DG Chest 2 View  Result Date: 10/07/2022 CLINICAL DATA:  Chest pain EXAM: CHEST - 2 VIEW COMPARISON:  09/11/2017 FINDINGS: The heart size and mediastinal contours are within normal limits. Both lungs are clear. The visualized skeletal structures are unremarkable. IMPRESSION: No active cardiopulmonary disease. Electronically Signed   By: Charlett Nose M.D.   On: 10/07/2022 20:01    Assessment & Plan:   Hyperlipidemia LDL goal <130- Will try low-dose, well-tolerated statin. -     Lipid panel; Future -     TSH; Future -     CK; Future -     Pitavastatin Calcium; Take 1 tablet (1 mg total) by mouth daily.  Dispense: 90 tablet; Refill: 1  Malignant hypertension-her blood pressure is well-controlled. -     Urinalysis, Routine w reflex microscopic; Future -     Microalbumin / creatinine urine ratio; Future -     Spironolactone; Take 1 tablet (100 mg total) by mouth daily.  Dispense: 90 tablet; Refill: 0  Current moderate episode of major depressive disorder, unspecified whether recurrent (HCC)  Vitamin D deficiency  GAD (generalized anxiety disorder)  Diuretic-induced hypokalemia -     Spironolactone; Take 1 tablet (100 mg total) by mouth daily.  Dispense: 90 tablet; Refill: 0  Statin myopathy -     CK; Future  Vitamin B12 deficiency -     Folate; Future -     Cyanocobalamin  Prediabetes -     Hemoglobin A1c; Future -     Microalbumin / creatinine urine ratio; Future -     HM Diabetes Foot Exam  Encounter for general adult medical examination with abnormal findings- Exam completed, labs reviewed,  vaccines reviewed, cancer screenings are up-to-date, patient education was given.  Tobacco use disorder -  Varenicline Tartrate; Take 1 tablet (1 mg total) by mouth 2 (two) times daily.  Dispense: 60 tablet; Refill: 3 -     Varenicline Tartrate (Starter); Take one 0.5 mg tablet by mouth once daily for 3 days, then increase to one 0.5 mg tablet twice daily for 4 days, then increase to one 1 mg tablet twice daily.  Dispense: 53 each; Refill: 0  PAD (peripheral artery disease) (HCC) -     Pitavastatin Calcium; Take 1 tablet (1 mg total) by mouth daily.  Dispense: 90 tablet; Refill: 1     Follow-up: Return in about 6 months (around 04/25/2023).  Sanda Linger, MD

## 2022-10-25 ENCOUNTER — Telehealth (HOSPITAL_COMMUNITY): Payer: Self-pay

## 2022-10-25 ENCOUNTER — Ambulatory Visit: Payer: 59

## 2022-10-25 NOTE — Telephone Encounter (Signed)
Spoke with the patient, detailed instructions given. She stated that she would be here for her test. Asked to call back with any questions. S.Kenecia Barren EMTP/CCT 

## 2022-10-26 ENCOUNTER — Telehealth: Payer: Self-pay | Admitting: Internal Medicine

## 2022-10-26 ENCOUNTER — Encounter: Payer: Self-pay | Admitting: Internal Medicine

## 2022-10-26 NOTE — Telephone Encounter (Signed)
Pt called wanting a call back to discuss test results. Pt had some concerns. Pleas advise.

## 2022-10-27 DIAGNOSIS — I739 Peripheral vascular disease, unspecified: Secondary | ICD-10-CM | POA: Insufficient documentation

## 2022-10-29 ENCOUNTER — Ambulatory Visit: Payer: 59 | Admitting: Gastroenterology

## 2022-10-29 NOTE — Telephone Encounter (Signed)
Called pt no answer LMOM RTC. Per lab Seen by patient April May on 10/26/2022  1:43 PM ...Raechel Chute

## 2022-10-30 NOTE — Telephone Encounter (Signed)
Called pt again she states MD sent her a mychart msg and answer her questions.Marland KitchenRaechel Chute.

## 2022-10-31 ENCOUNTER — Ambulatory Visit (HOSPITAL_COMMUNITY): Payer: 59 | Attending: Cardiology

## 2022-10-31 DIAGNOSIS — Z01818 Encounter for other preprocedural examination: Secondary | ICD-10-CM | POA: Diagnosis not present

## 2022-10-31 DIAGNOSIS — R079 Chest pain, unspecified: Secondary | ICD-10-CM | POA: Diagnosis not present

## 2022-10-31 LAB — MYOCARDIAL PERFUSION IMAGING
LV dias vol: 66 mL (ref 46–106)
LV sys vol: 20 mL
Nuc Stress EF: 69 %
Peak HR: 86 {beats}/min
Rest HR: 48 {beats}/min
Rest Nuclear Isotope Dose: 10.8 mCi
SDS: 5
SRS: 0
SSS: 5
Stress Nuclear Isotope Dose: 850 mCi
TID: 0.86

## 2022-10-31 MED ORDER — TECHNETIUM TC 99M TETROFOSMIN IV KIT
10.8000 | PACK | Freq: Once | INTRAVENOUS | Status: AC | PRN
  Administered 2022-10-31: 10.8 via INTRAVENOUS

## 2022-10-31 MED ORDER — REGADENOSON 0.4 MG/5ML IV SOLN
0.4000 mg | Freq: Once | INTRAVENOUS | Status: AC
Start: 2022-10-31 — End: 2022-10-31
  Administered 2022-10-31: 0.4 mg via INTRAVENOUS

## 2022-10-31 MED ORDER — TECHNETIUM TC 99M TETROFOSMIN IV KIT
30.6000 | PACK | Freq: Once | INTRAVENOUS | Status: AC | PRN
  Administered 2022-10-31: 30.6 via INTRAVENOUS

## 2022-11-01 ENCOUNTER — Other Ambulatory Visit: Payer: Self-pay | Admitting: Cardiology

## 2022-11-01 ENCOUNTER — Other Ambulatory Visit: Payer: Self-pay | Admitting: Internal Medicine

## 2022-11-01 DIAGNOSIS — F321 Major depressive disorder, single episode, moderate: Secondary | ICD-10-CM

## 2022-11-01 DIAGNOSIS — I10 Essential (primary) hypertension: Secondary | ICD-10-CM

## 2022-11-02 ENCOUNTER — Other Ambulatory Visit: Payer: Self-pay | Admitting: Internal Medicine

## 2022-11-02 DIAGNOSIS — F321 Major depressive disorder, single episode, moderate: Secondary | ICD-10-CM

## 2022-11-02 DIAGNOSIS — I10 Essential (primary) hypertension: Secondary | ICD-10-CM

## 2022-11-02 MED ORDER — AMLODIPINE BESYLATE 10 MG PO TABS
10.0000 mg | ORAL_TABLET | Freq: Every day | ORAL | 0 refills | Status: DC
Start: 2022-11-02 — End: 2023-01-11

## 2022-11-02 MED ORDER — BUPROPION HCL ER (XL) 150 MG PO TB24
150.0000 mg | ORAL_TABLET | Freq: Every day | ORAL | 1 refills | Status: DC
Start: 2022-11-02 — End: 2023-04-13

## 2022-11-05 ENCOUNTER — Ambulatory Visit (INDEPENDENT_AMBULATORY_CARE_PROVIDER_SITE_OTHER): Payer: 59 | Admitting: Internal Medicine

## 2022-11-05 DIAGNOSIS — Z0000 Encounter for general adult medical examination without abnormal findings: Secondary | ICD-10-CM

## 2022-11-05 NOTE — Progress Notes (Signed)
Subjective:   April May is a 67 y.o. female who presents for Medicare Annual (Subsequent) preventive examination.  Visit Complete: Virtual  I connected with  April May on 11/05/22 by a audio enabled telemedicine application and verified that I am speaking with the correct person using two identifiers.  Patient Location: Home  Provider Location: Home Office  I discussed the limitations of evaluation and management by telemedicine. The patient expressed understanding and agreed to proceed.  Patient Medicare AWV questionnaire was completed by the patient on 11/05/2022; I have confirmed that all information answered by patient is correct and no changes since this date.       Objective:    There were no vitals filed for this visit. There is no height or weight on file to calculate BMI.     10/07/2022    7:08 PM 05/18/2022    6:16 AM 03/20/2022   10:13 AM 11/24/2021   10:41 AM 11/22/2020    9:52 AM 05/20/2018   12:46 AM 09/25/2017    2:50 PM  Advanced Directives  Does Patient Have a Medical Advance Directive? No No No No Yes No No  Does patient want to make changes to medical advance directive?     No - Patient declined    Would patient like information on creating a medical advance directive? No - Patient declined No - Patient declined No - Patient declined No - Patient declined  No - Patient declined     Current Medications (verified) Outpatient Encounter Medications as of 11/05/2022  Medication Sig   albuterol (PROAIR HFA) 108 (90 Base) MCG/ACT inhaler TAKE 2 PUFFS BY MOUTH EVERY 6 HOURS AS NEEDED FOR WHEEZE OR SHORTNESS OF BREATH (Patient taking differently: Inhale 2 puffs into the lungs every 6 (six) hours as needed for shortness of breath or wheezing. TAKE 2 PUFFS BY MOUTH EVERY 6 HOURS AS NEEDED FOR WHEEZE OR SHORTNESS OF BREATH)   amLODipine (NORVASC) 10 MG tablet Take 1 tablet (10 mg total) by mouth daily.   aspirin EC 81 MG tablet Take 1 tablet (81  mg total) by mouth daily. Swallow whole.   budesonide (ENTOCORT EC) 3 MG 24 hr capsule Take 3 capsules (9 mg total) by mouth daily.   buPROPion (WELLBUTRIN XL) 150 MG 24 hr tablet Take 1 tablet (150 mg total) by mouth daily.   Cholecalciferol (VITAMIN D3) 1.25 MG (50000 UT) capsule TAKE 1 CAPSULE BY MOUTH ONE TIME PER WEEK   clonazePAM (KLONOPIN) 0.5 MG tablet Take 1 tablet (0.5 mg total) by mouth 2 (two) times daily as needed for anxiety. (Patient taking differently: Take 0.5 mg by mouth 2 (two) times daily as needed for anxiety.)   clopidogrel (PLAVIX) 75 MG tablet Take 1 tablet (75 mg total) by mouth daily.   [employer] PAIN RELIEF EXTRA STRENGTH 500 MG tablet TAKE 1 TABLET BY MOUTH EVERY 8 HOURS AS NEEDED FOR MODERATE PAIN (Patient taking differently: Take 500-1,000 mg by mouth every 8 (eight) hours as needed for moderate pain.)   Cyanocobalamin (B-12 COMPLIANCE INJECTION) 1000 MCG/ML KIT Inject as directed every 30 (thirty) days.   ezetimibe (ZETIA) 10 MG tablet TAKE 1 TABLET BY MOUTH DAILY   famotidine (PEPCID) 20 MG tablet Take 1 tablet (20 mg total) by mouth 2 (two) times daily.   fluticasone (FLONASE) 50 MCG/ACT nasal spray Place 2 sprays into both nostrils daily. (Patient taking differently: Place 2 sprays into both nostrils daily as needed for allergies.)   lamoTRIgine (LAMICTAL)  200 MG tablet Take 1 tablet (200 mg total) by mouth at bedtime.   nitroGLYCERIN (NITRO-BID) 2 % ointment Apply 0.5 inches topically 3 (three) times daily. (Patient taking differently: Apply 0.5 inches topically 3 (three) times daily. Per pt applies to right great toe)   Pitavastatin Calcium 1 MG TABS Take 1 tablet (1 mg total) by mouth daily.   Potassium Citrate 15 MEQ (1620 MG) TBCR TAKE 1 TABLET BY MOUTH IN THE  MORNING AT NOON, AND AT BEDTIME   spironolactone (ALDACTONE) 100 MG tablet Take 1 tablet (100 mg total) by mouth daily.   STIOLTO RESPIMAT 2.5-2.5 MCG/ACT AERS USE 2 INHALATIONS BY MOUTH  DAILY (Patient taking  differently: Inhale 2 each into the lungs daily as needed.)   varenicline (CHANTIX CONTINUING MONTH PAK) 1 MG tablet Take 1 tablet (1 mg total) by mouth 2 (two) times daily.   Varenicline Tartrate, Starter, (CHANTIX STARTING MONTH PAK) 0.5 MG X 11 & 1 MG X 42 TBPK Take one 0.5 mg tablet by mouth once daily for 3 days, then increase to one 0.5 mg tablet twice daily for 4 days, then increase to one 1 mg tablet twice daily.   No facility-administered encounter medications on file as of 11/05/2022.    Allergies (verified) Azathioprine, Crestor [rosuvastatin calcium], Lipitor [atorvastatin calcium], Semaglutide, Nsaids, and Lisinopril   History: Past Medical History:  Diagnosis Date   Abrasion 05/10/2022   right foot   Anal fistula    Anticoagulated    plavix and asa--- managed by vascular, dr Myra Gianotti   Arthritis    B12 deficiency    CKD (chronic kidney disease), stage I    [facility] --- samantha collins PA (lov in epic/ media tab 02-23-2022)   COPD (chronic obstructive pulmonary disease) (HCC)    Coronary artery calcification seen on CAT scan    cardiologist--- dr Anne Fu;    nuclear stress test 10-29-2017  normal perfusion , nuclear ef 60%;   echo 10-16-2017  ef 55-60%, mild TR   Crohn's disease of both small and large intestine (HCC)    followed by dr stark  (GI   Full dentures    GAD (generalized anxiety disorder)    History of acute pancreatitis    secondary to azathioprine   History of small bowel obstruction 11/24/2014   admission in epic  for crohn's flare w/ sbo and abscess s/p  surgical intervention   History of squamous cell carcinoma of skin 06/11/2017   excision left arm   Hyperlipidemia, mixed    followed by cardiology and pcp;   statin intolerate   MDD (major depressive disorder)    OSA on CPAP    followed by dr Peggye Ley ;  (05-16-2022  per pt uses nightly)   study in epic 01-05-2020  AHI 15.4/hr;  does not use cpap since weight loss 1 and 1/2 years ago   Perianal  lesion    Peripheral vascular disease (HCC) 02/2022   vascular--- dr Myra Gianotti;   03-20-2022  s/p  balloon angioplasty right occluded peroneal artery, right anterior tibial artery, right tibioperoneal truck   PONV (postoperative nausea and vomiting)    Pre-diabetes    Resistant hypertension    followed by pcp and cardiology   Swelling of right foot 05/10/2022   Vitamin D deficiency    Past Surgical History:  Procedure Laterality Date   ABDOMINAL AORTOGRAM W/LOWER EXTREMITY N/A 03/20/2022   Procedure: ABDOMINAL AORTOGRAM W/LOWER EXTREMITY;  Surgeon: Nada Libman, MD;  Location: [facility]  INVASIVE CV LAB;  Service: Cardiovascular;  Laterality: N/A;   ABDOMINAL HYSTERECTOMY     age 78;  per pt thinks ovaries were removed   APPENDECTOMY     teen   APPLICATION OF WOUND VAC  11/24/2014   Procedure: APPLICATION OF WOUND VAC;  Surgeon: Manus Rudd, MD;  Location: [facility];  Service: General;;   BOWEL RESECTION N/A 11/24/2014   Procedure: SMALL BOWEL RESECTION;  Surgeon: Manus Rudd, MD;  Location: [facility];  Service: General;  Laterality: N/A;   BREAST EXCISIONAL BIOPSY Right 1972   benign   LAPAROTOMY N/A 11/24/2014   Procedure: EXPLORATORY LAPAROTOMY EXTENSIVE LYSIS OF ADHESIONS SAMLL BOWEL RESECTION RIGHT HEMI COLECTOMY WOUND VAC APPLICATION. ;  Surgeon: Manus Rudd, MD;  Location: [facility];  Service: General;  Laterality: N/A;   LYSIS OF ADHESION  11/24/2014   Procedure: LYSIS OF ADHESION (3 HRS);  Surgeon: Manus Rudd, MD;  Location: [facility];  Service: General;;   PARTIAL COLECTOMY  11/24/2014   Procedure: RIGHT HEMI COLECTOMY;  Surgeon: Manus Rudd, MD;  Location: [facility];  Service: General;;   PERIPHERAL VASCULAR BALLOON ANGIOPLASTY  03/20/2022   Procedure: PERIPHERAL VASCULAR BALLOON ANGIOPLASTY;  Surgeon: Nada Libman, MD;  Location: [facility] INVASIVE CV LAB;  Service: Cardiovascular;;  AT and Peroneal   SMALL INTESTINE SURGERY     age 41  (for crohn's disease)   TONSILLECTOMY     teen    Family History  Problem Relation Age of Onset   Hypertension Mother    Ulcerative colitis Mother    Crohn's disease Mother    Heart disease Mother    Kidney disease Mother    Arthritis Father    Brain cancer Father    Diabetes Father    Alcohol abuse Paternal Uncle    Diabetes Paternal Grandmother    Stroke Paternal Grandmother    Heart failure Sister    Kidney disease Sister    Heart failure Brother    Other Sister        prediabetes   Kidney cancer Brother    Social History   Socioeconomic History   Marital status: Divorced    Spouse name: Not on file   Number of children: 2   Years of education: Not on file   Highest education level: 12th grade  Occupational History   Occupation: disabled  Tobacco Use   Smoking status: Every Day    Packs/day: 0.75    Years: 48.00    Additional pack years: 0.00    Total pack years: 36.00    Types: Cigarettes   Smokeless tobacco: Never   Tobacco comments:    05-16-2022  per pt started smoking as a teen and trying to quit  Vaping Use   Vaping Use: Never used  Substance and Sexual Activity   Alcohol use: No   Drug use: Never   Sexual activity: Not on file  Other Topics Concern   Not on file  Social History Narrative   Not on file   Social Determinants of Health   Financial Resource Strain: Low Risk  (11/24/2021)   Overall Financial Resource Strain (CARDIA)    Difficulty of Paying Living Expenses: Not hard at all  Food Insecurity: No Food Insecurity (11/24/2021)   Hunger Vital Sign    Worried About Running Out of Food in the Last Year: Never true    Ran Out of Food in the Last Year: Never true  Transportation Needs: No Transportation Needs (11/24/2021)  PRAPARE - Administrator, Civil Service (Medical): No    Lack of Transportation (Non-Medical): No  Physical Activity: Sufficiently Active (11/24/2021)   Exercise Vital Sign    Days of Exercise per Week: 5 days    Minutes of Exercise per Session: 30 min   Stress: No Stress Concern Present (11/24/2021)   [employer] of Occupational Health - Occupational Stress Questionnaire    Feeling of Stress : Not at all  Social Connections: Moderately Integrated (11/24/2021)   Social Connection and Isolation Panel [NHANES]    Frequency of Communication with Friends and Family: More than three times a week    Frequency of Social Gatherings with Friends and Family: Once a week    Attends Religious Services: More than 4 times per year    Active Member of [employer] or Organizations: No    Attends Engineer, structural: More than 4 times per year    Marital Status: Divorced    Tobacco Counseling Ready to quit: Not Answered Counseling given: Not Answered Tobacco comments: 05-16-2022  per pt started smoking as a teen and trying to quit   Clinical Intake:                        Activities of Daily Living    05/18/2022    6:20 AM 11/24/2021   10:51 AM  In your present state of health, do you have any difficulty performing the following activities:  Hearing? 0 0  Vision? 0 0  Difficulty concentrating or making decisions? 0 0  Walking or climbing stairs? 1 0  Dressing or bathing? 0 0  Doing errands, shopping?  0  Preparing Food and eating ?  N  Using the Toilet?  N  In the past six months, have you accidently leaked urine?  N  Do you have problems with loss of bowel control?  N  Managing your Medications?  N  Managing your Finances?  N  Housekeeping or managing your Housekeeping?  N    Patient Care Team: Etta Grandchild, MD as PCP - General (Internal Medicine) Jake Bathe, MD as PCP - Cardiology (Cardiology) Kathyrn Sheriff, [facility] (Inactive) as Pharmacist (Pharmacist) Huston Foley, MD as Attending Physician (Neurology) Karie Soda, MD as Consulting Physician (Colon and Rectal Surgery) Jake Bathe, MD as Consulting Physician (Cardiology) Meryl Dare, MD as Consulting Physician (Gastroenterology) Nada Libman, MD as Consulting Physician (Vascular Surgery)  Indicate any recent Medical Services you may have received from other than [facility] providers in the past year (date may be approximate).     Assessment:   This is a routine wellness examination for [place].  Hearing/Vision screen No results found.  Dietary issues and exercise activities discussed:     Goals Addressed   None   Depression Screen    10/24/2022    9:53 AM 11/24/2021   10:39 AM 10/30/2021   10:30 AM 06/21/2021   10:02 AM 11/22/2020   10:09 AM 10/27/2020    1:17 PM 09/20/2020   11:16 AM  PHQ 2/9 Scores  PHQ - 2 Score 0 0 1 0 0 0 2  PHQ- 9 Score 0  6 0 0 0 7    Fall Risk    11/24/2021   10:37 AM 11/22/2020   10:12 AM 10/27/2020    1:17 PM 01/04/2020    1:57 PM 01/27/2019   11:20 AM  Fall Risk   Falls in the past  year? 0 0 0 0 0  Number falls in past yr: 0 0 0  0  Injury with Fall? 0 0 0 0 0  Risk for fall due to : No Fall Risks No Fall Risks  No Fall Risks   Follow up Falls evaluation completed Falls evaluation completed  Falls evaluation completed Falls evaluation completed    MEDICARE RISK AT HOME:      Cognitive Function:        11/24/2021   10:53 AM  6CIT Screen  What Year? 0 points  What month? 0 points  What time? 0 points  Count back from 20 0 points  Months in reverse 0 points  Repeat phrase 0 points  Total Score 0 points    Immunizations Immunization History  Administered Date(s) Administered   Fluad Quad(high Dose 65+) 02/09/2022   Influenza Split 04/04/2011, 02/15/2012   Influenza Whole 04/30/2008, 04/01/2009   Influenza,inj,Quad PF,6+ Mos 02/24/2015, 03/21/2016, 01/31/2017, 01/08/2018, 01/08/2019, 01/22/2020, 01/30/2021   PFIZER Comirnaty(Gray Top)Covid-19 Tri-Sucrose Vaccine 12/30/2020   PFIZER(Purple Top)SARS-COV-2 Vaccination 07/18/2019, 08/08/2019, 03/04/2020   PPD Test 10/16/2013, 03/05/2018, 12/16/2020   Pneumococcal Conjugate-13 02/24/2015   Pneumococcal Polysaccharide-23  02/15/2012, 08/20/2017   Td 12/31/2008   Tdap 10/16/2013    TDAP status: Up to date  Flu Vaccine status: Up to date  Pneumococcal vaccine status: Up to date  Covid-19 vaccine status: Completed vaccines  Qualifies for Shingles Vaccine? Yes   Zostavax completed No   Shingrix Completed?: No.    Education has been provided regarding the importance of this vaccine. Patient has been advised to call insurance company to determine out of pocket expense if they have not yet received this vaccine. Advised may also receive vaccine at local pharmacy or Health Dept. Verbalized acceptance and understanding.  Screening Tests Health Maintenance  Topic Date Due   OPHTHALMOLOGY EXAM  09/23/2021   COVID-19 Vaccine (5 - 2023-24 season) 11/12/2022 (Originally 01/12/2022)   Zoster Vaccines- Shingrix (1 of 2) 01/24/2023 (Originally 10/27/1974)   Pneumonia Vaccine 2+ Years old (3 of 3 - PPSV23 or PCV20) 10/24/2023 (Originally 08/21/2022)   INFLUENZA VACCINE  12/13/2022   HEMOGLOBIN A1C  04/25/2023   Lung Cancer Screening  10/07/2023   Diabetic kidney evaluation - eGFR measurement  10/07/2023   DTaP/Tdap/Td (3 - Td or Tdap) 10/17/2023   Diabetic kidney evaluation - Urine ACR  10/24/2023   FOOT EXAM  10/24/2023   Medicare Annual Wellness (AWV)  11/05/2023   MAMMOGRAM  03/02/2024   Colonoscopy  07/24/2025   DEXA SCAN  Completed   Hepatitis C Screening  Completed   HPV VACCINES  Aged Out    Health Maintenance  Health Maintenance Due  Topic Date Due   OPHTHALMOLOGY EXAM  09/23/2021    Colorectal cancer screening: Type of screening: Colonoscopy. Completed 07/25/2022. Repeat every 3 years  Mammogram status: Completed 03/02/2022. Repeat every year  Bone Density status: Completed 12/30/2018. Results reflect: Bone density results: NORMAL. Repeat every 0 years.  Lung Cancer Screening: (Low Dose CT Chest recommended if Age 69-80 years, 20 pack-year currently smoking OR have quit w/in 15years.) does  qualify.   Lung Cancer Screening Referral: COMPLETED 09/14/2022  Additional Screening:  Hepatitis C Screening: does qualify; Completed 01/14/2013  Vision Screening: Recommended annual ophthalmology exams for early detection of glaucoma and other disorders of the eye. Is the patient up to date with their annual eye exam?  Yes  Who is the provider or what is the name of the office in which  the patient attends annual eye exams? DOES NOT KNOW NAME If pt is not established with a provider, would they like to be referred to a provider to establish care?  ESTABLISHED .   Dental Screening: Recommended annual dental exams for proper oral hygiene  Diabetic Foot Exam: Diabetic Foot Exam: Completed NA  Community Resource Referral / Chronic Care Management: CRR required this visit?  No   CCM required this visit?  No     Plan:     I have personally reviewed and noted the following in the patient's chart:   Medical and social history Use of alcohol, tobacco or illicit drugs  Current medications and supplements including opioid prescriptions. Patient is not currently taking opioid prescriptions. Functional ability and status Nutritional status Physical activity Advanced directives List of other physicians Hospitalizations, surgeries, and ER visits in previous 12 months Vitals Screenings to include cognitive, depression, and falls Referrals and appointments  In addition, I have reviewed and discussed with patient certain preventive protocols, quality metrics, and best practice recommendations. A written personalized care plan for preventive services as well as general preventive health recommendations were provided to patient.     Delana Meyer   11/05/2022   After Visit Summary: (MyChart) Due to this being a telephonic visit, the after visit summary with patients personalized plan was offered to patient via MyChart   Nurse Notes:

## 2022-11-05 NOTE — Patient Instructions (Signed)

## 2022-11-06 ENCOUNTER — Encounter: Payer: Self-pay | Admitting: Cardiology

## 2022-11-08 NOTE — Progress Notes (Signed)
Fax received from [facility] on 10/16/22 for medical clearance/medication hold for breast cyst surgery to be signed by V.W. Myra Gianotti, MD.  Provider signed on 10/24/22, form faxed back to sender on 10/30/22, verified successful, sent to scan center.

## 2022-11-08 NOTE — Progress Notes (Signed)
Subjective:   April May is a 67 y.o. female who presents for Medicare Annual (Subsequent) preventive examination.  Visit Complete: Virtual  I connected with  April May on 11/06/2022 by a audio enabled telemedicine application and verified that I am speaking with the correct person using two identifiers.  Patient Location: Home  Provider Location: Home Office  I discussed the limitations of evaluation and management by telemedicine. The patient expressed understanding and agreed to proceed.  Patient Medicare AWV questionnaire was completed by the patient on 11/05/2022; I have confirmed that all information answered by patient is correct and no changes since this date. Cardiac Risk Factors include: advanced age (>5men, >13 women);hypertension     Objective:    Today's Vitals   There is no height or weight on file to calculate BMI.     11/05/2022    9:26 AM 10/07/2022    7:08 PM 05/18/2022    6:16 AM 03/20/2022   10:13 AM 11/24/2021   10:41 AM 11/22/2020    9:52 AM 05/20/2018   12:46 AM  Advanced Directives  Does Patient Have a Medical Advance Directive? Yes No No No No Yes No  Does patient want to make changes to medical advance directive?      No - Patient declined   Would patient like information on creating a medical advance directive?  No - Patient declined No - Patient declined No - Patient declined No - Patient declined  No - Patient declined    Current Medications (verified) Outpatient Encounter Medications as of 11/05/2022  Medication Sig   albuterol (PROAIR HFA) 108 (90 Base) MCG/ACT inhaler TAKE 2 PUFFS BY MOUTH EVERY 6 HOURS AS NEEDED FOR WHEEZE OR SHORTNESS OF BREATH (Patient taking differently: Inhale 2 puffs into the lungs every 6 (six) hours as needed for shortness of breath or wheezing. TAKE 2 PUFFS BY MOUTH EVERY 6 HOURS AS NEEDED FOR WHEEZE OR SHORTNESS OF BREATH)   amLODipine (NORVASC) 10 MG tablet Take 1 tablet (10 mg total) by mouth  daily.   aspirin EC 81 MG tablet Take 1 tablet (81 mg total) by mouth daily. Swallow whole.   budesonide (ENTOCORT EC) 3 MG 24 hr capsule Take 3 capsules (9 mg total) by mouth daily.   buPROPion (WELLBUTRIN XL) 150 MG 24 hr tablet Take 1 tablet (150 mg total) by mouth daily.   Cholecalciferol (VITAMIN D3) 1.25 MG (50000 UT) capsule TAKE 1 CAPSULE BY MOUTH ONE TIME PER WEEK   clonazePAM (KLONOPIN) 0.5 MG tablet Take 1 tablet (0.5 mg total) by mouth 2 (two) times daily as needed for anxiety. (Patient taking differently: Take 0.5 mg by mouth 2 (two) times daily as needed for anxiety.)   clopidogrel (PLAVIX) 75 MG tablet Take 1 tablet (75 mg total) by mouth daily.   [employer] PAIN RELIEF EXTRA STRENGTH 500 MG tablet TAKE 1 TABLET BY MOUTH EVERY 8 HOURS AS NEEDED FOR MODERATE PAIN (Patient taking differently: Take 500-1,000 mg by mouth every 8 (eight) hours as needed for moderate pain.)   Cyanocobalamin (B-12 COMPLIANCE INJECTION) 1000 MCG/ML KIT Inject as directed every 30 (thirty) days.   ezetimibe (ZETIA) 10 MG tablet TAKE 1 TABLET BY MOUTH DAILY   famotidine (PEPCID) 20 MG tablet Take 1 tablet (20 mg total) by mouth 2 (two) times daily.   fluticasone (FLONASE) 50 MCG/ACT nasal spray Place 2 sprays into both nostrils daily. (Patient taking differently: Place 2 sprays into both nostrils daily as needed for allergies.)  lamoTRIgine (LAMICTAL) 200 MG tablet Take 1 tablet (200 mg total) by mouth at bedtime.   nitroGLYCERIN (NITRO-BID) 2 % ointment Apply 0.5 inches topically 3 (three) times daily. (Patient taking differently: Apply 0.5 inches topically 3 (three) times daily. Per pt applies to right great toe)   Potassium Citrate 15 MEQ (1620 MG) TBCR TAKE 1 TABLET BY MOUTH IN THE  MORNING AT NOON, AND AT BEDTIME   spironolactone (ALDACTONE) 100 MG tablet Take 1 tablet (100 mg total) by mouth daily.   STIOLTO RESPIMAT 2.5-2.5 MCG/ACT AERS USE 2 INHALATIONS BY MOUTH  DAILY (Patient taking differently: Inhale 2  each into the lungs daily as needed.)   varenicline (CHANTIX CONTINUING MONTH PAK) 1 MG tablet Take 1 tablet (1 mg total) by mouth 2 (two) times daily.   Varenicline Tartrate, Starter, (CHANTIX STARTING MONTH PAK) 0.5 MG X 11 & 1 MG X 42 TBPK Take one 0.5 mg tablet by mouth once daily for 3 days, then increase to one 0.5 mg tablet twice daily for 4 days, then increase to one 1 mg tablet twice daily.   Pitavastatin Calcium 1 MG TABS Take 1 tablet (1 mg total) by mouth daily.   No facility-administered encounter medications on file as of 11/05/2022.    Allergies (verified) Azathioprine, Crestor [rosuvastatin calcium], Lipitor [atorvastatin calcium], Semaglutide, Nsaids, and Lisinopril   History: Past Medical History:  Diagnosis Date   Abrasion 05/10/2022   right foot   Anal fistula    Anticoagulated    plavix and asa--- managed by vascular, dr Myra Gianotti   Arthritis    B12 deficiency    CKD (chronic kidney disease), stage I    [facility] --- samantha collins PA (lov in epic/ media tab 02-23-2022)   COPD (chronic obstructive pulmonary disease) (HCC)    Coronary artery calcification seen on CAT scan    cardiologist--- dr Anne Fu;    nuclear stress test 10-29-2017  normal perfusion , nuclear ef 60%;   echo 10-16-2017  ef 55-60%, mild TR   Crohn's disease of both small and large intestine (HCC)    followed by dr stark  (GI   Full dentures    GAD (generalized anxiety disorder)    History of acute pancreatitis    secondary to azathioprine   History of small bowel obstruction 11/24/2014   admission in epic  for crohn's flare w/ sbo and abscess s/p  surgical intervention   History of squamous cell carcinoma of skin 06/11/2017   excision left arm   Hyperlipidemia, mixed    followed by cardiology and pcp;   statin intolerate   MDD (major depressive disorder)    OSA on CPAP    followed by dr Peggye Ley ;  (05-16-2022  per pt uses nightly)   study in epic 01-05-2020  AHI 15.4/hr;  does not use  cpap since weight loss 1 and 1/2 years ago   Perianal lesion    Peripheral vascular disease (HCC) 02/2022   vascular--- dr Myra Gianotti;   03-20-2022  s/p  balloon angioplasty right occluded peroneal artery, right anterior tibial artery, right tibioperoneal truck   PONV (postoperative nausea and vomiting)    Pre-diabetes    Resistant hypertension    followed by pcp and cardiology   Swelling of right foot 05/10/2022   Vitamin D deficiency    Past Surgical History:  Procedure Laterality Date   ABDOMINAL AORTOGRAM W/LOWER EXTREMITY N/A 03/20/2022   Procedure: ABDOMINAL AORTOGRAM W/LOWER EXTREMITY;  Surgeon: Nada Libman, MD;  Location: [facility];  Service: Cardiovascular;  Laterality: N/A;   ABDOMINAL HYSTERECTOMY     age 31;  per pt thinks ovaries were removed   APPENDECTOMY     teen   APPLICATION OF WOUND VAC  11/24/2014   Procedure: APPLICATION OF WOUND VAC;  Surgeon: Manus Rudd, MD;  Location: [facility];  Service: General;;   BOWEL RESECTION N/A 11/24/2014   Procedure: SMALL BOWEL RESECTION;  Surgeon: Manus Rudd, MD;  Location: [facility];  Service: General;  Laterality: N/A;   BREAST EXCISIONAL BIOPSY Right 1972   benign   LAPAROTOMY N/A 11/24/2014   Procedure: EXPLORATORY LAPAROTOMY EXTENSIVE LYSIS OF ADHESIONS SAMLL BOWEL RESECTION RIGHT HEMI COLECTOMY WOUND VAC APPLICATION. ;  Surgeon: Manus Rudd, MD;  Location: [facility];  Service: General;  Laterality: N/A;   LYSIS OF ADHESION  11/24/2014   Procedure: LYSIS OF ADHESION (3 HRS);  Surgeon: Manus Rudd, MD;  Location: [facility];  Service: General;;   PARTIAL COLECTOMY  11/24/2014   Procedure: RIGHT HEMI COLECTOMY;  Surgeon: Manus Rudd, MD;  Location: [facility];  Service: General;;   PERIPHERAL VASCULAR BALLOON ANGIOPLASTY  03/20/2022   Procedure: PERIPHERAL VASCULAR BALLOON ANGIOPLASTY;  Surgeon: Nada Libman, MD;  Location: [facility];  Service: Cardiovascular;;  AT and Peroneal   SMALL INTESTINE SURGERY      age 16  (for crohn's disease)   TONSILLECTOMY     teen   Family History  Problem Relation Age of Onset   Hypertension Mother    Ulcerative colitis Mother    Crohn's disease Mother    Heart disease Mother    Kidney disease Mother    Arthritis Father    Brain cancer Father    Diabetes Father    Alcohol abuse Paternal Uncle    Diabetes Paternal Grandmother    Stroke Paternal Grandmother    Heart failure Sister    Kidney disease Sister    Heart failure Brother    Other Sister        prediabetes   Kidney cancer Brother    Social History   Socioeconomic History   Marital status: Divorced    Spouse name: Not on file   Number of children: 2   Years of education: Not on file   Highest education level: 12th grade  Occupational History   Occupation: disabled  Tobacco Use   Smoking status: Every Day    Packs/day: 0.75    Years: 48.00    Additional pack years: 0.00    Total pack years: 36.00    Types: Cigarettes   Smokeless tobacco: Never   Tobacco comments:    05-16-2022  per pt started smoking as a teen and trying to quit  Vaping Use   Vaping Use: Never used  Substance and Sexual Activity   Alcohol use: No   Drug use: Never   Sexual activity: Not on file  Other Topics Concern   Not on file  Social History Narrative   Not on file   Social Determinants of Health   Financial Resource Strain: Low Risk  (11/05/2022)   Overall Financial Resource Strain (CARDIA)    Difficulty of Paying Living Expenses: Not very hard  Food Insecurity: No Food Insecurity (11/05/2022)   Hunger Vital Sign    Worried About Running Out of Food in the Last Year: Never true    Ran Out of Food in the Last Year: Never true  Transportation Needs: No Transportation Needs (11/05/2022)  PRAPARE - Administrator, Civil Service (Medical): No    Lack of Transportation (Non-Medical): No  Physical Activity: Inactive (11/05/2022)   Exercise Vital Sign    Days of Exercise per Week: 0 days     Minutes of Exercise per Session: 0 min  Stress: No Stress Concern Present (11/05/2022)   [employer] of Occupational Health - Occupational Stress Questionnaire    Feeling of Stress : Not at all  Social Connections: Socially Isolated (11/05/2022)   Social Connection and Isolation Panel [NHANES]    Frequency of Communication with Friends and Family: Once a week    Frequency of Social Gatherings with Friends and Family: More than three times a week    Attends Religious Services: Never    Database administrator or Organizations: No    Attends Banker Meetings: Never    Marital Status: Divorced    Tobacco Counseling Ready to quit: Not Answered Counseling given: Not Answered Tobacco comments: 05-16-2022  per pt started smoking as a teen and trying to quit   Clinical Intake:  Pre-visit preparation completed: Yes  Pain : No/denies pain     Diabetes: No  How often do you need to have someone help you when you read instructions, pamphlets, or other written materials from your doctor or pharmacy?: 1 - Never What is the last grade level you completed in school?: DIPLOMA  Interpreter Needed?: No      Activities of Daily Living    11/05/2022    9:14 AM 05/18/2022    6:20 AM  In your present state of health, do you have any difficulty performing the following activities:  Hearing? 0 0  Vision? 1 0  Difficulty concentrating or making decisions? 0 0  Walking or climbing stairs? 0 1  Dressing or bathing? 0 0  Doing errands, shopping? 0   Preparing Food and eating ? N   Using the Toilet? N   In the past six months, have you accidently leaked urine? N   Do you have problems with loss of bowel control? N   Managing your Medications? N   Managing your Finances? N   Housekeeping or managing your Housekeeping? N     Patient Care Team: Etta Grandchild, MD as PCP - General (Internal Medicine) Jake Bathe, MD as PCP - Cardiology (Cardiology) Kathyrn Sheriff,  [facility] (Inactive) as Pharmacist (Pharmacist) Huston Foley, MD as Attending Physician (Neurology) Karie Soda, MD as Consulting Physician (Colon and Rectal Surgery) Jake Bathe, MD as Consulting Physician (Cardiology) Meryl Dare, MD as Consulting Physician (Gastroenterology) Nada Libman, MD as Consulting Physician (Vascular Surgery)  Indicate any recent Medical Services you may have received from other than [facility] providers in the past year (date may be approximate).     Assessment:   This is a routine wellness examination for [place].  Hearing/Vision screen No results found.  Dietary issues and exercise activities discussed:     Goals Addressed             This Visit's Progress    OTHER       PT WOULD LIKE TO GET HER OWN PLACE. BETTER HEALTH.       Depression Screen    11/05/2022    9:24 AM 10/24/2022    9:53 AM 11/24/2021   10:39 AM 10/30/2021   10:30 AM 06/21/2021   10:02 AM 11/22/2020   10:09 AM 10/27/2020    1:17 PM  PHQ 2/9  Scores  PHQ - 2 Score 3 0 0 1 0 0 0  PHQ- 9 Score 6 0  6 0 0 0    Fall Risk    11/05/2022    9:27 AM 11/24/2021   10:37 AM 11/22/2020   10:12 AM 10/27/2020    1:17 PM 01/04/2020    1:57 PM  Fall Risk   Falls in the past year? 0 0 0 0 0  Number falls in past yr: 0 0 0 0   Injury with Fall? 0 0 0 0 0  Risk for fall due to : No Fall Risks No Fall Risks No Fall Risks  No Fall Risks  Follow up Falls evaluation completed Falls evaluation completed Falls evaluation completed  Falls evaluation completed    MEDICARE RISK AT HOME:      Cognitive Function:        11/05/2022    9:27 AM 11/24/2021   10:53 AM  6CIT Screen  What Year? 0 points 0 points  What month? 0 points 0 points  What time? 0 points 0 points  Count back from 20 0 points 0 points  Months in reverse 4 points 0 points  Repeat phrase 8 points 0 points  Total Score 12 points 0 points    Immunizations Immunization History  Administered Date(s) Administered    Fluad Quad(high Dose 65+) 02/09/2022   Influenza Split 04/04/2011, 02/15/2012   Influenza Whole 04/30/2008, 04/01/2009   Influenza,inj,Quad PF,6+ Mos 02/24/2015, 03/21/2016, 01/31/2017, 01/08/2018, 01/08/2019, 01/22/2020, 01/30/2021   PFIZER Comirnaty(Gray Top)Covid-19 Tri-Sucrose Vaccine 12/30/2020   PFIZER(Purple Top)SARS-COV-2 Vaccination 07/18/2019, 08/08/2019, 03/04/2020   PPD Test 10/16/2013, 03/05/2018, 12/16/2020   Pneumococcal Conjugate-13 02/24/2015   Pneumococcal Polysaccharide-23 02/15/2012, 08/20/2017   Td 12/31/2008   Tdap 10/16/2013    TDAP status: Up to date  Flu Vaccine status: Up to date  Pneumococcal vaccine status: Up to date  Covid-19 vaccine status: Completed vaccines  Qualifies for Shingles Vaccine? Yes   Zostavax completed No   Shingrix Completed?: No.    Education has been provided regarding the importance of this vaccine. Patient has been advised to call insurance company to determine out of pocket expense if they have not yet received this vaccine. Advised may also receive vaccine at local pharmacy or Health Dept. Verbalized acceptance and understanding.  Screening Tests Health Maintenance  Topic Date Due   OPHTHALMOLOGY EXAM  09/23/2021   COVID-19 Vaccine (5 - 2023-24 season) 11/12/2022 (Originally 01/12/2022)   Zoster Vaccines- Shingrix (1 of 2) 01/24/2023 (Originally 10/27/1974)   Pneumonia Vaccine 49+ Years old (3 of 3 - PPSV23 or PCV20) 10/24/2023 (Originally 08/21/2022)   INFLUENZA VACCINE  12/13/2022   HEMOGLOBIN A1C  04/25/2023   Lung Cancer Screening  10/07/2023   Diabetic kidney evaluation - eGFR measurement  10/07/2023   DTaP/Tdap/Td (3 - Td or Tdap) 10/17/2023   Diabetic kidney evaluation - Urine ACR  10/24/2023   FOOT EXAM  10/24/2023   Medicare Annual Wellness (AWV)  11/05/2023   MAMMOGRAM  03/02/2024   Colonoscopy  07/24/2025   DEXA SCAN  Completed   Hepatitis C Screening  Completed   HPV VACCINES  Aged Out    Health  Maintenance  Health Maintenance Due  Topic Date Due   OPHTHALMOLOGY EXAM  09/23/2021    Colorectal cancer screening: Type of screening: Colonoscopy. Completed 07/25/2022. Repeat every 3 years  Mammogram status: Completed 03/02/2022. Repeat every year  Bone Density status: Completed 12/30/2018. Results reflect: Bone density results: NORMAL. Repeat every  0 years.  Lung Cancer Screening: (Low Dose CT Chest recommended if Age 53-80 years, 20 pack-year currently smoking OR have quit w/in 15years.) does qualify.   Lung Cancer Screening Referral: COMPLETED 09/14/2022  Additional Screening:  Hepatitis C Screening: does qualify; Completed 01/14/2013  Vision Screening: Recommended annual ophthalmology exams for early detection of glaucoma and other disorders of the eye. Is the patient up to date with their annual eye exam?  Yes  Who is the provider or what is the name of the office in which the patient attends annual eye exams? DOES NOT KNOW NAME If pt is not established with a provider, would they like to be referred to a provider to establish care?  ESTABLISHED .   Dental Screening: Recommended annual dental exams for proper oral hygiene  Diabetic Foot Exam: Diabetic Foot Exam: Completed NA  Community Resource Referral / Chronic Care Management: CRR required this visit?  No   CCM required this visit?  No     Plan:     I have personally reviewed and noted the following in the patient's chart:   Medical and social history Use of alcohol, tobacco or illicit drugs  Current medications and supplements including opioid prescriptions. Patient is not currently taking opioid prescriptions. Functional ability and status Nutritional status Physical activity Advanced directives List of other physicians Hospitalizations, surgeries, and ER visits in previous 12 months Vitals Screenings to include cognitive, depression, and falls Referrals and appointments  In addition, I have reviewed  and discussed with patient certain preventive protocols, quality metrics, and best practice recommendations. A written personalized care plan for preventive services as well as general preventive health recommendations were provided to patient.     Delana Meyer   11/06/2022   After Visit Summary: (MyChart) Due to this being a telephonic visit, the after visit summary with patients personalized plan was offered to patient via MyChart   Nurse Notes: none   Ms. Marcoux , Thank you for taking time to come for your Medicare Wellness Visit. I appreciate your ongoing commitment to your health goals. Please review the following plan we discussed and let me know if I can assist you in the future.   These are the goals we discussed:  Goals      lose weight     Increase my physical activity by walking or chair exercises.     OTHER     PT WOULD LIKE TO GET HER OWN PLACE. BETTER HEALTH.      To lose at least 50-60 pounds.        This is a list of the screening recommended for you and due dates:  Health Maintenance  Topic Date Due   Eye exam for diabetics  09/23/2021   COVID-19 Vaccine (5 - 2023-24 season) 11/12/2022*   Zoster (Shingles) Vaccine (1 of 2) 01/24/2023*   Pneumonia Vaccine (3 of 3 - PPSV23 or PCV20) 10/24/2023*   Flu Shot  12/13/2022   Hemoglobin A1C  04/25/2023   Screening for Lung Cancer  10/07/2023   Yearly kidney function blood test for diabetes  10/07/2023   DTaP/Tdap/Td vaccine (3 - Td or Tdap) 10/17/2023   Yearly kidney health urinalysis for diabetes  10/24/2023   Complete foot exam   10/24/2023   Medicare Annual Wellness Visit  11/05/2023   Mammogram  03/02/2024   Colon Cancer Screening  07/24/2025   DEXA scan (bone density measurement)  Completed   Hepatitis C Screening  Completed   HPV Vaccine  Aged Out  *Topic was postponed. The date shown is not the original due date.

## 2022-11-13 ENCOUNTER — Ambulatory Visit: Payer: 59 | Admitting: Nurse Practitioner

## 2022-11-15 ENCOUNTER — Other Ambulatory Visit: Payer: Self-pay | Admitting: Internal Medicine

## 2022-11-15 DIAGNOSIS — T502X5A Adverse effect of carbonic-anhydrase inhibitors, benzothiadiazides and other diuretics, initial encounter: Secondary | ICD-10-CM

## 2022-11-15 DIAGNOSIS — I10 Essential (primary) hypertension: Secondary | ICD-10-CM

## 2022-11-16 ENCOUNTER — Telehealth: Payer: Self-pay | Admitting: Cardiology

## 2022-11-16 NOTE — Telephone Encounter (Signed)
I will forward to pre op APP to review if the pt has been cleared since stress test has been completed.

## 2022-11-16 NOTE — Telephone Encounter (Signed)
Patient called stating she had her stress done, she states [facility] has not received clearance from [place] yet and she can't schedule her surgery until they do.  She would a call back from [place] once we send them the clearance.

## 2022-11-16 NOTE — Telephone Encounter (Signed)
   Name: April May  DOB: Jun 05, 1955  MRN: [id number]   Primary Cardiologist: Donato Schultz, MD  Chart reviewed as part of pre-operative protocol coverage. April May was last seen on 10/19/2022 by Dr. Anne Fu. Per Dr. Anne Fu: "Was just in the emergency department with chest pain. Has known coronary artery disease with coronary calcification seen on prior CT scan.-We will go ahead and proceed with pharmacologic stress test to ensure that she does not have any high risk ischemia present. If testing is reassuring, she may proceed with breast procedure."   Nuclear stress test performed 10/31/2022 was a normal, low risk study with normal perfusion.   Therefore, based on ACC/AHA guidelines, the patient would be at acceptable risk for the planned procedure without further cardiovascular testing.   I will route this recommendation to the requesting party via Epic fax function and remove from pre-op pool. Please call with questions.  Carlos Levering, NP 11/16/2022, 4:33 PM

## 2022-11-22 DIAGNOSIS — G4733 Obstructive sleep apnea (adult) (pediatric): Secondary | ICD-10-CM | POA: Diagnosis not present

## 2022-11-29 ENCOUNTER — Other Ambulatory Visit: Payer: Self-pay | Admitting: Gastroenterology

## 2022-12-02 ENCOUNTER — Other Ambulatory Visit: Payer: Self-pay | Admitting: Internal Medicine

## 2022-12-02 DIAGNOSIS — F321 Major depressive disorder, single episode, moderate: Secondary | ICD-10-CM

## 2022-12-06 ENCOUNTER — Ambulatory Visit: Payer: 59

## 2022-12-07 ENCOUNTER — Ambulatory Visit (INDEPENDENT_AMBULATORY_CARE_PROVIDER_SITE_OTHER): Payer: 59

## 2022-12-07 DIAGNOSIS — E538 Deficiency of other specified B group vitamins: Secondary | ICD-10-CM | POA: Diagnosis not present

## 2022-12-07 MED ORDER — CYANOCOBALAMIN 1000 MCG/ML IJ SOLN
1000.0000 ug | Freq: Once | INTRAMUSCULAR | Status: AC
Start: 2022-12-07 — End: 2022-12-07
  Administered 2022-12-07: 1000 ug via INTRAMUSCULAR

## 2022-12-07 NOTE — Progress Notes (Signed)
Patient here for monthly B12 injection per Dr. Jones.  B12 1000 mcg given in left IM and patient tolerated injection well today.  

## 2022-12-11 ENCOUNTER — Ambulatory Visit (INDEPENDENT_AMBULATORY_CARE_PROVIDER_SITE_OTHER): Payer: 59 | Admitting: Internal Medicine

## 2022-12-11 ENCOUNTER — Encounter: Payer: Self-pay | Admitting: Internal Medicine

## 2022-12-11 VITALS — BP 128/80 | HR 77 | Temp 98.6°F | Ht 63.0 in | Wt 221.0 lb

## 2022-12-11 DIAGNOSIS — R7303 Prediabetes: Secondary | ICD-10-CM

## 2022-12-11 DIAGNOSIS — E559 Vitamin D deficiency, unspecified: Secondary | ICD-10-CM | POA: Diagnosis not present

## 2022-12-11 DIAGNOSIS — Z716 Tobacco abuse counseling: Secondary | ICD-10-CM | POA: Diagnosis not present

## 2022-12-11 DIAGNOSIS — L989 Disorder of the skin and subcutaneous tissue, unspecified: Secondary | ICD-10-CM | POA: Insufficient documentation

## 2022-12-11 DIAGNOSIS — F411 Generalized anxiety disorder: Secondary | ICD-10-CM

## 2022-12-11 MED ORDER — DOXYCYCLINE HYCLATE 100 MG PO TABS: 100.0000 mg | ORAL_TABLET | Freq: Two times a day (BID) | ORAL | 0 refills | Status: DC

## 2022-12-11 NOTE — Progress Notes (Unsigned)
Patient ID: April May, female   DOB: Jul 20, 1955, 67 y.o.   MRN: [id number]        Chief Complaint: follow up skin lesion to the right elbow, anxiety, predm, low vit d, smoker       HPI:  April May is a 67 y.o. female here with above, x 3 days after may have suffered some trauma such as scratching, not sure. Has small wound with slight fluid drainage,wondering if this is an abscess and what to do about it.  Thinks she may need some kid of procedure such as I&D.  Has some redness, swelling, tender prox to the wound starting up last pm.  Pt denies chest pain, increased sob or doe, wheezing, orthopnea, PND, increased LE swelling, palpitations, dizziness or syncope.   Pt denies polydipsia, polyuria, or new focal neuro s/s.    Pt denies fever, wt loss, night sweats, loss of appetite, or other constitutional symptoms     Denies worsening depressive symptoms, suicidal ideation, or panic; has ongoing anxiety, Still smoking, not ready to quit.         Wt Readings from Last 3 Encounters:  12/11/22 221 lb (100.2 kg)  10/31/22 216 lb (98 kg)  10/24/22 219 lb (99.3 kg)   BP Readings from Last 3 Encounters:  12/11/22 128/80  10/24/22 132/74  10/19/22 126/70         Past Medical History:  Diagnosis Date   Abrasion 05/10/2022   right foot   Anal fistula    Anticoagulated    plavix and asa--- managed by vascular, dr Myra Gianotti   Arthritis    B12 deficiency    CKD (chronic kidney disease), stage I    [facility] --- samantha collins PA (lov in epic/ media tab 02-23-2022)   COPD (chronic obstructive pulmonary disease) (HCC)    Coronary artery calcification seen on CAT scan    cardiologist--- dr Anne Fu;    nuclear stress test 10-29-2017  normal perfusion , nuclear ef 60%;   echo 10-16-2017  ef 55-60%, mild TR   Crohn's disease of both small and large intestine (HCC)    followed by dr stark  (GI   Full dentures    GAD (generalized anxiety disorder)    History of acute  pancreatitis    secondary to azathioprine   History of small bowel obstruction 11/24/2014   admission in epic  for crohn's flare w/ sbo and abscess s/p  surgical intervention   History of squamous cell carcinoma of skin 06/11/2017   excision left arm   Hyperlipidemia, mixed    followed by cardiology and pcp;   statin intolerate   MDD (major depressive disorder)    OSA on CPAP    followed by dr Peggye Ley ;  (05-16-2022  per pt uses nightly)   study in epic 01-05-2020  AHI 15.4/hr;  does not use cpap since weight loss 1 and 1/2 years ago   Perianal lesion    Peripheral vascular disease (HCC) 02/2022   vascular--- dr Myra Gianotti;   03-20-2022  s/p  balloon angioplasty right occluded peroneal artery, right anterior tibial artery, right tibioperoneal truck   PONV (postoperative nausea and vomiting)    Pre-diabetes    Resistant hypertension    followed by pcp and cardiology   Swelling of right foot 05/10/2022   Vitamin D deficiency    Past Surgical History:  Procedure Laterality Date   ABDOMINAL AORTOGRAM W/LOWER EXTREMITY N/A 03/20/2022   Procedure: ABDOMINAL AORTOGRAM W/LOWER EXTREMITY;  Surgeon: Nada Libman, MD;  Location: [facility];  Service: Cardiovascular;  Laterality: N/A;   ABDOMINAL HYSTERECTOMY     age 87;  per pt thinks ovaries were removed   APPENDECTOMY     teen   APPLICATION OF WOUND VAC  11/24/2014   Procedure: APPLICATION OF WOUND VAC;  Surgeon: Manus Rudd, MD;  Location: [facility];  Service: General;;   BOWEL RESECTION N/A 11/24/2014   Procedure: SMALL BOWEL RESECTION;  Surgeon: Manus Rudd, MD;  Location: [facility];  Service: General;  Laterality: N/A;   BREAST EXCISIONAL BIOPSY Right 1972   benign   LAPAROTOMY N/A 11/24/2014   Procedure: EXPLORATORY LAPAROTOMY EXTENSIVE LYSIS OF ADHESIONS SAMLL BOWEL RESECTION RIGHT HEMI COLECTOMY WOUND VAC APPLICATION. ;  Surgeon: Manus Rudd, MD;  Location: [facility];  Service: General;  Laterality: N/A;   LYSIS OF ADHESION   11/24/2014   Procedure: LYSIS OF ADHESION (3 HRS);  Surgeon: Manus Rudd, MD;  Location: [facility];  Service: General;;   PARTIAL COLECTOMY  11/24/2014   Procedure: RIGHT HEMI COLECTOMY;  Surgeon: Manus Rudd, MD;  Location: [facility];  Service: General;;   PERIPHERAL VASCULAR BALLOON ANGIOPLASTY  03/20/2022   Procedure: PERIPHERAL VASCULAR BALLOON ANGIOPLASTY;  Surgeon: Nada Libman, MD;  Location: [facility];  Service: Cardiovascular;;  AT and Peroneal   SMALL INTESTINE SURGERY     age 37  (for crohn's disease)   TONSILLECTOMY     teen    reports that she has been smoking cigarettes. She has a 36 pack-year smoking history. She has never used smokeless tobacco. She reports that she does not drink alcohol and does not use drugs. family history includes Alcohol abuse in her paternal uncle; Arthritis in her father; Brain cancer in her father; Crohn's disease in her mother; Diabetes in her father and paternal grandmother; Heart disease in her mother; Heart failure in her brother and sister; Hypertension in her mother; Kidney cancer in her brother; Kidney disease in her mother and sister; Other in her sister; Stroke in her paternal grandmother; Ulcerative colitis in her mother. Allergies  Allergen Reactions   Azathioprine Other (See Comments)    Side effects to pancrease      Crestor [Rosuvastatin Calcium] Other (See Comments)    Muscle and joint aches   Lipitor [Atorvastatin Calcium] Other (See Comments)    Muscle cramps   Semaglutide Other (See Comments)    Constipation    Nsaids Other (See Comments)    Crohn's disease = NO  NSIADs   Lisinopril     Uncontrollable head shaking    Current Outpatient Medications on File Prior to Visit  Medication Sig Dispense Refill   albuterol (PROAIR HFA) 108 (90 Base) MCG/ACT inhaler TAKE 2 PUFFS BY MOUTH EVERY 6 HOURS AS NEEDED FOR WHEEZE OR SHORTNESS OF BREATH (Patient taking differently: Inhale 2 puffs into the lungs every 6 (six) hours as  needed for shortness of breath or wheezing. TAKE 2 PUFFS BY MOUTH EVERY 6 HOURS AS NEEDED FOR WHEEZE OR SHORTNESS OF BREATH) 18 g 5   amLODipine (NORVASC) 10 MG tablet Take 1 tablet (10 mg total) by mouth daily. 100 tablet 0   aspirin EC 81 MG tablet Take 1 tablet (81 mg total) by mouth daily. Swallow whole. 150 tablet 2   budesonide (ENTOCORT EC) 3 MG 24 hr capsule TAKE 3 CAPSULES (9 MG TOTAL) BY MOUTH DAILY. 270 capsule 0   buPROPion (WELLBUTRIN XL) 150 MG 24 hr  tablet Take 1 tablet (150 mg total) by mouth daily. 100 tablet 1   Cholecalciferol (VITAMIN D3) 1.25 MG (50000 UT) capsule TAKE 1 CAPSULE BY MOUTH ONE TIME PER WEEK 12 capsule 0   clonazePAM (KLONOPIN) 0.5 MG tablet Take 1 tablet (0.5 mg total) by mouth 2 (two) times daily as needed for anxiety. (Patient taking differently: Take 0.5 mg by mouth 2 (two) times daily as needed for anxiety.) 60 tablet 3   clopidogrel (PLAVIX) 75 MG tablet Take 1 tablet (75 mg total) by mouth daily. 30 tablet 11   [employer] PAIN RELIEF EXTRA STRENGTH 500 MG tablet TAKE 1 TABLET BY MOUTH EVERY 8 HOURS AS NEEDED FOR MODERATE PAIN (Patient taking differently: Take 500-1,000 mg by mouth every 8 (eight) hours as needed for moderate pain.) 60 tablet 5   Cyanocobalamin (B-12 COMPLIANCE INJECTION) 1000 MCG/ML KIT Inject as directed every 30 (thirty) days.     ezetimibe (ZETIA) 10 MG tablet TAKE 1 TABLET BY MOUTH DAILY 90 tablet 3   famotidine (PEPCID) 20 MG tablet Take 1 tablet (20 mg total) by mouth 2 (two) times daily. 30 tablet 0   fluticasone (FLONASE) 50 MCG/ACT nasal spray Place 2 sprays into both nostrils daily. (Patient taking differently: Place 2 sprays into both nostrils daily as needed for allergies.) 48 g 3   lamoTRIgine (LAMICTAL) 200 MG tablet TAKE 1 TABLET BY MOUTH AT  BEDTIME 100 tablet 0   nitroGLYCERIN (NITRO-BID) 2 % ointment Apply 0.5 inches topically 3 (three) times daily. (Patient taking differently: Apply 0.5 inches topically 3 (three) times daily. Per pt  applies to right great toe) 30 g 1   Pitavastatin Calcium 1 MG TABS Take 1 tablet (1 mg total) by mouth daily. 90 tablet 1   Potassium Citrate 15 MEQ (1620 MG) TBCR TAKE 1 TABLET BY MOUTH IN THE  MORNING AT NOON, AND AT BEDTIME 300 tablet 0   spironolactone (ALDACTONE) 100 MG tablet Take 1 tablet (100 mg total) by mouth daily. 90 tablet 0   STIOLTO RESPIMAT 2.5-2.5 MCG/ACT AERS USE 2 INHALATIONS BY MOUTH  DAILY (Patient taking differently: Inhale 2 each into the lungs daily as needed.) 12 g 1   varenicline (CHANTIX CONTINUING MONTH PAK) 1 MG tablet Take 1 tablet (1 mg total) by mouth 2 (two) times daily. 60 tablet 3   Varenicline Tartrate, Starter, (CHANTIX STARTING MONTH PAK) 0.5 MG X 11 & 1 MG X 42 TBPK Take one 0.5 mg tablet by mouth once daily for 3 days, then increase to one 0.5 mg tablet twice daily for 4 days, then increase to one 1 mg tablet twice daily. 53 each 0   No current facility-administered medications on file prior to visit.        ROS:  All others reviewed and negative.  Objective        PE:  BP 128/80 (BP Location: Left Arm, Patient Position: Sitting, Cuff Size: Normal)   Pulse 77   Temp 98.6 F (37 C) (Oral)   Ht 5\' 3"  (1.6 m)   Wt 221 lb (100.2 kg)   LMP 05/14/1988   SpO2 98%   BMI 39.15 kg/m                 Constitutional: Pt appears in NAD               HENT: Head: NCAT.                Right Ear: External ear normal.  Left Ear: External ear normal.                Eyes: . Pupils are equal, round, and reactive to light. Conjunctivae and EOM are normal               Nose: without d/c or deformity               Neck: Neck supple. Gross normal ROM               Cardiovascular: Normal rate and regular rhythm.                 Pulmonary/Chest: Effort normal and breath sounds without rales or wheezing.                              Neurological: Pt is alert. At baseline orientation, motor grossly intact               Skin: Skin is warm. LE edema - none,  right lateral elbow at the right epicondylar area has a open shallow wound approx 12 mm only with very slight clearish fluid with manipulation drainage, no pus or underlying abscess, does have mild tender erythem skin about 1 cm proximal without other red streaks.                 Psychiatric: Pt behavior is normal without agitation  but mod nervous  Micro: none  Cardiac tracings I have personally interpreted today:  none  Pertinent Radiological findings (summarize): none   Lab Results  Component Value Date   WBC 16.4 (H) 10/07/2022   HGB 17.6 (H) 10/07/2022   HCT 54.4 (H) 10/07/2022   PLT 308 10/07/2022   GLUCOSE 116 (H) 10/07/2022   CHOL 123 10/24/2022   TRIG 100.0 10/24/2022   HDL 55.00 10/24/2022   LDLCALC 48 10/24/2022   ALT 19 10/07/2022   AST 23 10/07/2022   NA 140 10/07/2022   K 3.7 10/07/2022   CL 102 10/07/2022   CREATININE 0.85 10/07/2022   BUN 10 10/07/2022   CO2 25 10/07/2022   TSH 2.97 10/24/2022   HGBA1C 5.8 10/24/2022   MICROALBUR <0.7 10/24/2022   Assessment/Plan:  April May is a 67 y.o. Black or African American [2] female with  has a past medical history of Abrasion (05/10/2022), Anal fistula, Anticoagulated, Arthritis, B12 deficiency, CKD (chronic kidney disease), stage I, COPD (chronic obstructive pulmonary disease) (HCC), Coronary artery calcification seen on CAT scan, Crohn's disease of both small and large intestine (HCC), Full dentures, GAD (generalized anxiety disorder), History of acute pancreatitis, History of small bowel obstruction (11/24/2014), History of squamous cell carcinoma of skin (06/11/2017), Hyperlipidemia, mixed, MDD (major depressive disorder), OSA on CPAP, Perianal lesion, Peripheral vascular disease (HCC) (02/2022), PONV (postoperative nausea and vomiting), Pre-diabetes, Resistant hypertension, Swelling of right foot (05/10/2022), and Vitamin D deficiency.  Skin lesion of right arm Small, no evidence for abscess or need for  procedure, but for doxycycline course, consider derm if not improving  Tobacco abuse counseling Pt counsled to quit, pt not ready  Prediabetes Lab Results  Component Value Date   HGBA1C 5.8 10/24/2022   Stable, pt to continue current medical treatment  - diet, wt control   Vitamin D deficiency Last vitamin D Lab Results  Component Value Date   VD25OH 59.30 04/24/2022   Stable, cont oral replacement   GAD (generalized anxiety disorder) Pt reassured, pt declines  need for change in tx at this time  Followup: Return if symptoms worsen or fail to improve.  Oliver Barre, MD 12/13/2022 8:27 PM [facility]

## 2022-12-11 NOTE — Patient Instructions (Signed)
Please take all new medication as prescribed - the antibiotic  Ok to continue to monitor the shallow wound, but if not improved, you would need to see your dermatology  Please continue all other medications as before, and refills have been done if requested.  Please have the pharmacy call with any other refills you may need.  Please keep your appointments with your specialists as you may have planned

## 2022-12-12 ENCOUNTER — Encounter (INDEPENDENT_AMBULATORY_CARE_PROVIDER_SITE_OTHER): Payer: Self-pay

## 2022-12-13 ENCOUNTER — Encounter: Payer: Self-pay | Admitting: Internal Medicine

## 2022-12-13 NOTE — Assessment & Plan Note (Signed)
Lab Results  Component Value Date   HGBA1C 5.8 10/24/2022   Stable, pt to continue current medical treatment  - diet, wt control

## 2022-12-13 NOTE — Assessment & Plan Note (Signed)
Last vitamin D Lab Results  Component Value Date   VD25OH 59.30 04/24/2022   Stable, cont oral replacement

## 2022-12-13 NOTE — Assessment & Plan Note (Signed)
Pt reassured, pt declines need for change in tx at this time

## 2022-12-13 NOTE — Assessment & Plan Note (Signed)
Small, no evidence for abscess or need for procedure, but for doxycycline course, consider derm if not improving

## 2022-12-13 NOTE — Assessment & Plan Note (Signed)
Pt counsled to quit, pt not ready °

## 2022-12-19 ENCOUNTER — Ambulatory Visit (HOSPITAL_COMMUNITY): Admission: RE | Admit: 2022-12-19 | Payer: 59 | Source: Home / Self Care | Admitting: Surgery

## 2022-12-19 ENCOUNTER — Encounter (HOSPITAL_COMMUNITY): Admission: RE | Payer: Self-pay | Source: Home / Self Care

## 2022-12-19 ENCOUNTER — Other Ambulatory Visit: Payer: Self-pay | Admitting: Surgery

## 2022-12-19 DIAGNOSIS — N6031 Fibrosclerosis of right breast: Secondary | ICD-10-CM | POA: Diagnosis not present

## 2022-12-19 DIAGNOSIS — L723 Sebaceous cyst: Secondary | ICD-10-CM | POA: Diagnosis not present

## 2022-12-19 DIAGNOSIS — N6032 Fibrosclerosis of left breast: Secondary | ICD-10-CM | POA: Diagnosis not present

## 2022-12-19 DIAGNOSIS — L720 Epidermal cyst: Secondary | ICD-10-CM | POA: Diagnosis not present

## 2022-12-19 SURGERY — CYST EXCISION BREAST
Anesthesia: General | Laterality: Bilateral

## 2022-12-20 DIAGNOSIS — L258 Unspecified contact dermatitis due to other agents: Secondary | ICD-10-CM | POA: Diagnosis not present

## 2022-12-24 ENCOUNTER — Encounter: Payer: Self-pay | Admitting: Surgery

## 2022-12-27 ENCOUNTER — Encounter: Payer: Self-pay | Admitting: Surgery

## 2023-01-07 ENCOUNTER — Ambulatory Visit: Payer: 59

## 2023-01-07 DIAGNOSIS — E538 Deficiency of other specified B group vitamins: Secondary | ICD-10-CM

## 2023-01-07 MED ORDER — CYANOCOBALAMIN 1000 MCG/ML IJ SOLN
1000.0000 ug | Freq: Once | INTRAMUSCULAR | Status: AC
Start: 2023-01-07 — End: 2023-01-07
  Administered 2023-01-07: 1000 ug via INTRAMUSCULAR

## 2023-01-07 NOTE — Progress Notes (Signed)
B12 given.  Pt tolerated well. Pt is aware to give the office a call for an side effects or reactions. Please co-sign.   

## 2023-01-10 ENCOUNTER — Other Ambulatory Visit: Payer: Self-pay | Admitting: Internal Medicine

## 2023-01-10 DIAGNOSIS — I10 Essential (primary) hypertension: Secondary | ICD-10-CM

## 2023-01-13 DIAGNOSIS — G4733 Obstructive sleep apnea (adult) (pediatric): Secondary | ICD-10-CM | POA: Diagnosis not present

## 2023-01-17 ENCOUNTER — Encounter: Payer: Self-pay | Admitting: Internal Medicine

## 2023-01-17 NOTE — Progress Notes (Signed)
[facility] [address] [place]. [place] [address] [phone]       OFFICE FOLLOW UP NOTE  Ms. April May Date of Birth:  05-Jan-1956 Medical Record Number:  [id number]   Primary neurologist: Dr. Frances May Reason for visit: CPAP follow-up    SUBJECTIVE:   CHIEF COMPLAINT:  Chief Complaint  Patient presents with   Follow-up    Patient in room #8 and alone. Patient states she been doing well and her CPAP machine. Patient is requesting a new type of head gear for her mask.   Follow-up visit:  Prior visit: 01/17/2022  Brief HPI:   April May is a 67 y.o. female who was initially evaluated by Dr. Frances May on 12/29/2019 with known history of sleep apnea (dx'd 2013 after sleep study) not on CPAP due to difficulty tolerating.  Complaining of lack of energy and daytime naps.  ESS 9/24. FSS 61/63.  Repeat sleep study 01/05/2020 showed moderate sleep apnea with total AHI 15.9/h and O2 nadir of 82%.  CPap initiated 08/08/2020. D[facility].   At prior visit, compliance report showed adequate compliance with optimal residual AHI.  Tolerating CPAP well after pressure adjustment to set pressure of 9 in 10/2020 for patient comfort.   Interval history:  Returns for follow-up CPAP visit.  Compliance report shows satisfactory usage and optimal residual AHI. Current use of nasal pillow mask. She does prefer to sleep on her stomach/side, will have some issues with straps pressing into her face or mask itself will shift, she is otherwise tolerating the mask well. Doesn't feel like she is getting enough air, she is requesting the pressure to be increased.  Routinely followed by [facility].  ESS 6/24.           ROS:   14 system review of systems performed and negative with exception of those listed in HPI  PMH:  Past Medical History:  Diagnosis Date   Abrasion 05/10/2022   right foot   Anal fistula    Anticoagulated    plavix and asa--- managed  by vascular, dr April May   Arthritis    B12 deficiency    CKD (chronic kidney disease), stage I    [facility] --- April May (lov in epic/ media tab 02-23-2022)   COPD (chronic obstructive pulmonary disease) (HCC)    Coronary artery calcification seen on CAT scan    cardiologist--- dr April May;    nuclear stress test 10-29-2017  normal perfusion , nuclear ef 60%;   echo 10-16-2017  ef 55-60%, mild TR   Crohn's disease of both small and large intestine (HCC)    followed by dr April May  (GI   Full dentures    GAD (generalized anxiety disorder)    History of acute pancreatitis    secondary to azathioprine   History of small bowel obstruction 11/24/2014   admission in epic  for crohn's flare w/ sbo and abscess s/p  surgical intervention   History of squamous cell carcinoma of skin 06/11/2017   excision left arm   Hyperlipidemia, mixed    followed by cardiology and pcp;   statin intolerate   MDD (major depressive disorder)    OSA on CPAP    followed by dr April May ;  (05-16-2022  per pt uses nightly)   study in epic 01-05-2020  AHI 15.4/hr;  does not use cpap since weight loss 1 and 1/2 years ago   Perianal lesion    Peripheral vascular disease (HCC) 02/2022  vascular--- dr April May;   03-20-2022  s/p  balloon angioplasty right occluded peroneal artery, right anterior tibial artery, right tibioperoneal truck   PONV (postoperative nausea and vomiting)    Pre-diabetes    Resistant hypertension    followed by pcp and cardiology   Swelling of right foot 05/10/2022   Vitamin D deficiency     PSH:  Past Surgical History:  Procedure Laterality Date   ABDOMINAL AORTOGRAM W/LOWER EXTREMITY N/A 03/20/2022   Procedure: ABDOMINAL AORTOGRAM W/LOWER EXTREMITY;  Surgeon: April Libman, MD;  Location: [facility];  Service: Cardiovascular;  Laterality: N/A;   ABDOMINAL HYSTERECTOMY     age 68;  per pt thinks ovaries were removed   APPENDECTOMY     teen   APPLICATION OF WOUND VAC   11/24/2014   Procedure: APPLICATION OF WOUND VAC;  Surgeon: April Rudd, MD;  Location: [facility];  Service: General;;   BOWEL RESECTION N/A 11/24/2014   Procedure: SMALL BOWEL RESECTION;  Surgeon: April Rudd, MD;  Location: [facility];  Service: General;  Laterality: N/A;   BREAST EXCISIONAL BIOPSY Right 1972   benign   LAPAROTOMY N/A 11/24/2014   Procedure: EXPLORATORY LAPAROTOMY EXTENSIVE LYSIS OF ADHESIONS SAMLL BOWEL RESECTION RIGHT HEMI COLECTOMY WOUND VAC APPLICATION. ;  Surgeon: April Rudd, MD;  Location: [facility];  Service: General;  Laterality: N/A;   LYSIS OF ADHESION  11/24/2014   Procedure: LYSIS OF ADHESION (3 HRS);  Surgeon: April Rudd, MD;  Location: [facility];  Service: General;;   PARTIAL COLECTOMY  11/24/2014   Procedure: RIGHT HEMI COLECTOMY;  Surgeon: April Rudd, MD;  Location: [facility];  Service: General;;   PERIPHERAL VASCULAR BALLOON ANGIOPLASTY  03/20/2022   Procedure: PERIPHERAL VASCULAR BALLOON ANGIOPLASTY;  Surgeon: April Libman, MD;  Location: [facility];  Service: Cardiovascular;;  AT and Peroneal   SMALL INTESTINE SURGERY     age 79  (for crohn's disease)   TONSILLECTOMY     teen    Social History:  Social History   Socioeconomic History   Marital status: Divorced    Spouse name: Not on file   Number of children: 2   Years of education: Not on file   Highest education level: 12th grade  Occupational History   Occupation: disabled  Tobacco Use   Smoking status: Every Day    Current packs/day: 0.75    Average packs/day: 0.8 packs/day for 48.0 years (36.0 ttl pk-yrs)    Types: Cigarettes   Smokeless tobacco: Never   Tobacco comments:    05-16-2022  per pt started smoking as a teen and trying to quit  Vaping Use   Vaping status: Never Used  Substance and Sexual Activity   Alcohol use: No   Drug use: Never   Sexual activity: Not on file  Other Topics Concern   Not on file  Social History Narrative   Not on file   Social  Determinants of Health   Financial Resource Strain: Low Risk  (11/05/2022)   Overall Financial Resource Strain (CARDIA)    Difficulty of Paying Living Expenses: Not very hard  Food Insecurity: No Food Insecurity (11/05/2022)   Hunger Vital Sign    Worried About Running Out of Food in the Last Year: Never true    Ran Out of Food in the Last Year: Never true  Transportation Needs: No Transportation Needs (11/05/2022)   PRAPARE - Transportation    Lack of Transportation (Medical): No    Lack of  Transportation (Non-Medical): No  Physical Activity: Inactive (11/05/2022)   Exercise Vital Sign    Days of Exercise per Week: 0 days    Minutes of Exercise per Session: 0 min  Stress: No Stress Concern Present (11/05/2022)   [employer] of Occupational Health - Occupational Stress Questionnaire    Feeling of Stress : Not at all  Social Connections: Socially Isolated (11/05/2022)   Social Connection and Isolation Panel [NHANES]    Frequency of Communication with Friends and Family: Once a week    Frequency of Social Gatherings with Friends and Family: More than three times a week    Attends Religious Services: Never    Database administrator or Organizations: No    Attends Banker Meetings: Never    Marital Status: Divorced  Catering manager Violence: Not At Risk (11/05/2022)   Humiliation, Afraid, Rape, and Kick questionnaire    Fear of Current or Ex-Partner: No    Emotionally Abused: No    Physically Abused: No    Sexually Abused: No    Family History:  Family History  Problem Relation Age of Onset   Hypertension Mother    Ulcerative colitis Mother    Crohn's disease Mother    Heart disease Mother    Kidney disease Mother    Arthritis Father    Brain cancer Father    Diabetes Father    Alcohol abuse Paternal Uncle    Diabetes Paternal Grandmother    Stroke Paternal Grandmother    Heart failure Sister    Kidney disease Sister    Heart failure Brother    Other  Sister        prediabetes   Kidney cancer Brother     Medications:   Current Outpatient Medications on File Prior to Visit  Medication Sig Dispense Refill   albuterol (PROAIR HFA) 108 (90 Base) MCG/ACT inhaler TAKE 2 PUFFS BY MOUTH EVERY 6 HOURS AS NEEDED FOR WHEEZE OR SHORTNESS OF BREATH (Patient taking differently: Inhale 2 puffs into the lungs every 6 (six) hours as needed for shortness of breath or wheezing. TAKE 2 PUFFS BY MOUTH EVERY 6 HOURS AS NEEDED FOR WHEEZE OR SHORTNESS OF BREATH) 18 g 5   amLODipine (NORVASC) 10 MG tablet TAKE 1 TABLET BY MOUTH DAILY 100 tablet 0   aspirin EC 81 MG tablet Take 1 tablet (81 mg total) by mouth daily. Swallow whole. 150 tablet 2   budesonide (ENTOCORT EC) 3 MG 24 hr capsule TAKE 3 CAPSULES (9 MG TOTAL) BY MOUTH DAILY. 270 capsule 0   buPROPion (WELLBUTRIN XL) 150 MG 24 hr tablet Take 1 tablet (150 mg total) by mouth daily. 100 tablet 1   Cholecalciferol (VITAMIN D3) 1.25 MG (50000 UT) capsule TAKE 1 CAPSULE BY MOUTH ONE TIME PER WEEK 12 capsule 0   clonazePAM (KLONOPIN) 0.5 MG tablet Take 1 tablet (0.5 mg total) by mouth 2 (two) times daily as needed for anxiety. (Patient taking differently: Take 0.5 mg by mouth 2 (two) times daily as needed for anxiety.) 60 tablet 3   clopidogrel (PLAVIX) 75 MG tablet Take 1 tablet (75 mg total) by mouth daily. 30 tablet 11   [employer] PAIN RELIEF EXTRA STRENGTH 500 MG tablet TAKE 1 TABLET BY MOUTH EVERY 8 HOURS AS NEEDED FOR MODERATE PAIN (Patient taking differently: Take 500-1,000 mg by mouth every 8 (eight) hours as needed for moderate pain.) 60 tablet 5   Cyanocobalamin (B-12 COMPLIANCE INJECTION) 1000 MCG/ML KIT Inject as directed every  30 (thirty) days.     doxycycline (VIBRA-TABS) 100 MG tablet Take 1 tablet (100 mg total) by mouth 2 (two) times daily. 20 tablet 0   ezetimibe (ZETIA) 10 MG tablet TAKE 1 TABLET BY MOUTH DAILY 90 tablet 3   fluticasone (FLONASE) 50 MCG/ACT nasal spray Place 2 sprays into both nostrils  daily. (Patient taking differently: Place 2 sprays into both nostrils daily as needed for allergies.) 48 g 3   lamoTRIgine (LAMICTAL) 200 MG tablet TAKE 1 TABLET BY MOUTH AT  BEDTIME 100 tablet 0   nicotine (NICODERM CQ - DOSED IN MG/24 HOURS) 14 mg/24hr patch Place 1 patch (14 mg total) onto the skin daily. 28 patch 0   nicotine (NICODERM CQ - DOSED IN MG/24 HOURS) 21 mg/24hr patch Place 1 patch (21 mg total) onto the skin daily. 28 patch 0   nicotine (NICODERM CQ - DOSED IN MG/24 HR) 7 mg/24hr patch Place 1 patch (7 mg total) onto the skin daily. 28 patch 0   Pitavastatin Calcium 1 MG TABS Take 1 tablet (1 mg total) by mouth daily. 90 tablet 1   Potassium Citrate 15 MEQ (1620 MG) TBCR TAKE 1 TABLET BY MOUTH IN THE  MORNING AT NOON, AND AT BEDTIME 300 tablet 0   spironolactone (ALDACTONE) 100 MG tablet Take 1 tablet (100 mg total) by mouth daily. 90 tablet 0   STIOLTO RESPIMAT 2.5-2.5 MCG/ACT AERS USE 2 INHALATIONS BY MOUTH  DAILY (Patient taking differently: Inhale 2 each into the lungs daily as needed.) 12 g 1   varenicline (CHANTIX CONTINUING MONTH PAK) 1 MG tablet Take 1 tablet (1 mg total) by mouth 2 (two) times daily. 60 tablet 3   Varenicline Tartrate, Starter, (CHANTIX STARTING MONTH PAK) 0.5 MG X 11 & 1 MG X 42 TBPK Take one 0.5 mg tablet by mouth once daily for 3 days, then increase to one 0.5 mg tablet twice daily for 4 days, then increase to one 1 mg tablet twice daily. 53 each 0   famotidine (PEPCID) 20 MG tablet Take 1 tablet (20 mg total) by mouth 2 (two) times daily. (Patient not taking: Reported on 01/21/2023) 30 tablet 0   nitroGLYCERIN (NITRO-BID) 2 % ointment Apply 0.5 inches topically 3 (three) times daily. (Patient not taking: Reported on 01/21/2023) 30 g 1   No current facility-administered medications on file prior to visit.    Allergies:   Allergies  Allergen Reactions   Azathioprine Other (See Comments)    Side effects to pancrease      Crestor [Rosuvastatin Calcium]  Other (See Comments)    Muscle and joint aches   Lipitor [Atorvastatin Calcium] Other (See Comments)    Muscle cramps   Semaglutide Other (See Comments)    Constipation    Nsaids Other (See Comments)    Crohn's disease = NO  NSIADs   Lisinopril     Uncontrollable head shaking       OBJECTIVE:  Physical Exam  Vitals:   01/21/23 0819  BP: 131/64  Pulse: 60  Weight: 232 lb (105.2 kg)  Height: 5' (1.524 m)    Body mass index is 45.31 kg/m. No results found.   General: well developed, well nourished, very pleasant middle-aged African-American female, seated, in no evident distress Head: head normocephalic and atraumatic.   Neck: supple with no carotid or supraclavicular bruits Cardiovascular: regular rate and rhythm, no murmurs Musculoskeletal: no deformity Skin:  no rash/petichiae Vascular:  Normal pulses all extremities   Neurologic Exam  Mental Status: Awake and fully alert. Oriented to place and time. Recent and remote memory intact. Attention span, concentration and fund of knowledge appropriate. Mood and affect appropriate.  Cranial Nerves: Pupils equal, briskly reactive to light. Extraocular movements full without nystagmus. Visual fields full to confrontation. Hearing intact. Facial sensation intact. Face, tongue, palate moves normally and symmetrically.  Motor: Normal bulk and tone. Normal strength in all tested extremity muscles Sensory.: intact to touch , pinprick , position and vibratory sensation.  Coordination: Rapid alternating movements normal in all extremities. Finger-to-nose and heel-to-shin performed accurately bilaterally. Gait and Station: Arises from chair without difficulty. Stance is normal. Gait demonstrates normal stride length and balance without use of AD. Tandem walk and heel toe without difficulty.  Reflexes: 1+ and symmetric. Toes downgoing.         ASSESSMENT/PLAN: April May is a 67 y.o. year old female     OSA on  CPAP : Compliance report shows satisfactory usage with optimal residual AHI. Will slightly increase pressure from 9 to 10 with EPR level 3 per patient request, will repeat download in 2 months to ensure apnea still being well treated. Will request f/u with DME to ensure good mask fit and to see if any other mask strap options are available, did discuss use of CPAP pillow to see if this helps with comfort when sleeping on stomach/side. Discussed continued nightly usage with ensuring greater than 4 hours nightly for optimal benefit and per insurance purposes.  Continue to follow with DME company for any needed supplies or CPAP related concerns     Follow up in 1 year or call earlier if needed   CC:  PCP: Etta Grandchild, MD    I spent 25 minutes of face-to-face and non-face-to-face time with patient.  This included previsit chart review, lab review, study review, order entry, electronic health record documentation, patient education regarding sleep apnea with review and discussion of compliance report and answered all other questions to patient's satisfaction   Ihor Austin, [facility]  [facility] [address] [place], [place] [address]  Phone [phone] Fax [phone] Note: This document was prepared with digital dictation and possible smart phrase technology. Any transcriptional errors that result from this process are unintentional.

## 2023-01-19 ENCOUNTER — Other Ambulatory Visit: Payer: Self-pay | Admitting: Internal Medicine

## 2023-01-19 DIAGNOSIS — F17200 Nicotine dependence, unspecified, uncomplicated: Secondary | ICD-10-CM

## 2023-01-19 MED ORDER — NICOTINE 21 MG/24HR TD PT24
21.0000 mg | MEDICATED_PATCH | Freq: Every day | TRANSDERMAL | 0 refills | Status: DC
Start: 2023-01-19 — End: 2023-05-21

## 2023-01-19 MED ORDER — NICOTINE 7 MG/24HR TD PT24
7.0000 mg | MEDICATED_PATCH | Freq: Every day | TRANSDERMAL | 0 refills | Status: DC
Start: 2023-01-19 — End: 2023-05-21

## 2023-01-19 MED ORDER — NICOTINE 14 MG/24HR TD PT24
14.0000 mg | MEDICATED_PATCH | Freq: Every day | TRANSDERMAL | 0 refills | Status: DC
Start: 2023-01-19 — End: 2023-05-21

## 2023-01-21 ENCOUNTER — Encounter: Payer: Self-pay | Admitting: Adult Health

## 2023-01-21 ENCOUNTER — Other Ambulatory Visit: Payer: Self-pay | Admitting: Internal Medicine

## 2023-01-21 ENCOUNTER — Ambulatory Visit (INDEPENDENT_AMBULATORY_CARE_PROVIDER_SITE_OTHER): Payer: 59 | Admitting: Adult Health

## 2023-01-21 VITALS — BP 131/64 | HR 60 | Ht 60.0 in | Wt 232.0 lb

## 2023-01-21 DIAGNOSIS — G4733 Obstructive sleep apnea (adult) (pediatric): Secondary | ICD-10-CM

## 2023-01-21 DIAGNOSIS — Z1231 Encounter for screening mammogram for malignant neoplasm of breast: Secondary | ICD-10-CM

## 2023-01-21 NOTE — Patient Instructions (Addendum)
Your Plan:  Continue use of CPAP with ensuring greater than 4 hours per night  We will increase the pressure to 10, you will also be contacted by your DME company to ensure your mask is sitting properly and to see if there are any other strap options   Continue to follow with DME company for any needs supplies or CPAP related concerns    Follow-up in 1 year or call earlier if needed     Thank you for coming to see [place] at [facility]. I hope we have been able to provide you high quality care today.  You may receive a patient satisfaction survey over the next few weeks. We would appreciate your feedback and comments so that we may continue to improve ourselves and the health of our patients.

## 2023-01-23 ENCOUNTER — Other Ambulatory Visit: Payer: Self-pay | Admitting: Internal Medicine

## 2023-01-23 DIAGNOSIS — I10 Essential (primary) hypertension: Secondary | ICD-10-CM

## 2023-01-23 DIAGNOSIS — E876 Hypokalemia: Secondary | ICD-10-CM

## 2023-02-10 ENCOUNTER — Other Ambulatory Visit: Payer: Self-pay | Admitting: Internal Medicine

## 2023-02-10 DIAGNOSIS — F321 Major depressive disorder, single episode, moderate: Secondary | ICD-10-CM

## 2023-02-13 ENCOUNTER — Encounter: Payer: Self-pay | Admitting: Gastroenterology

## 2023-02-13 ENCOUNTER — Ambulatory Visit (INDEPENDENT_AMBULATORY_CARE_PROVIDER_SITE_OTHER): Payer: 59 | Admitting: Gastroenterology

## 2023-02-13 VITALS — BP 126/78 | HR 59 | Ht 65.0 in | Wt 233.2 lb

## 2023-02-13 DIAGNOSIS — K50819 Crohn's disease of both small and large intestine with unspecified complications: Secondary | ICD-10-CM

## 2023-02-13 NOTE — Patient Instructions (Signed)
Continue budesonide.   Follow up in 6 months with Dr. Doy Hutching.   The [facility] providers would like to encourage you to use [facility] to communicate with providers for non-urgent requests or questions.  Due to long hold times on the telephone, sending your provider a message by [facility] may be a faster and more efficient way to get a response.  Please allow 48 business hours for a response.  Please remember that this is for non-urgent requests.   Thank you for choosing me and [facility].  Venita Lick. Pleas Koch., MD., Clementeen Graham

## 2023-02-13 NOTE — Progress Notes (Signed)
Assessment     Crohn's disease with ileitis and perianal disease. S/P TI and right colon resection in 2016 PVD on Plavix   Recommendations    Again we had a discussion about optimizing therapy to manage her IBD long-term.  She is unwilling to change therapies from budesonide at this time.  Colonoscopy in Mar 2027 REV with Dr. Doy Hutching in 6 months   HPI    This is a 67 year old female returning for follow-up of Crohn's disease.  She has no active gastrointestinal complaints.  She remains on budesonide.  Colonoscopy Mar 2025 - Patent end-to-side ileo-colonic anastomosis, characterized by neoterminal ileum erosions. Biopsied.  - Three 6 to 7 mm polyps in the sigmoid colon and in the transverse colon, removed with a cold snare. Resected and retrieved.  - Internal hemorrhoids.  - The examination was otherwise normal on direct and retroflexion views. Biopsied.  Surgical [P], small bowel, terminal ileum - ACTIVE CHRONIC ILEITIS WITH EROSIONS, PSEUDOPYLORIC METAPLASIA AND REACTIVE CHANGES CONSISTENT WITH THE PATIENT'S CLINICAL HISTORY OF CROHN'S DISEASE - NEGATIVE FOR GRANULOMATA, DYSPLASIA OR MALIGNANCY 2. Surgical [P], colon, transverse, polyp (2) - POLYPOID LOW-GRADE DYSPLASIA (1 FRAGMENT) - HYPERPLASTIC POLYP(S) (2 FRAGMENTS) - NEGATIVE FOR HIGH-GRADE DYSPLASIA OR MALIGNANCY - SEE NOTE 3. Surgical [P], random colon bx - BENIGN COLONIC MUCOSA - NEGATIVE FOR SIGNIFICANT CHRONIC CHANGES, ACTIVE INFLAMMATION, GRANULOMATA, DYSPLASIA OR MALIGNANCY There is a single, small fragment of polypoid low-grade dysplasia present on sections examined. This may represent a small, sporadic tubular adenoma or polypoid low grade dysplasia in the setting of inflammatory bowel disease. Clinical correlation recommended.   Labs / Imaging       Latest Ref Rng & Units 10/07/2022    7:10 PM 03/27/2022   10:18 AM 06/21/2021   10:44 AM  Hepatic Function  Total Protein 6.5 - 8.1 g/dL 8.2  7.1  6.8    Albumin 3.5 - 5.0 g/dL 4.1  4.1  3.9   AST 15 - 41 U/L 23  17  14    ALT 0 - 44 U/L 19  21  18    Alk Phosphatase 38 - 126 U/L 66  48  44   Total Bilirubin 0.3 - 1.2 mg/dL 1.3  0.4  0.6   Bilirubin, Direct 0.0 - 0.2 mg/dL <2.8   0.1        Latest Ref Rng & Units 10/07/2022    7:10 PM 05/18/2022    6:12 AM 05/15/2022    9:55 AM  CBC  WBC 4.0 - 10.5 K/uL 16.4   15.3   Hemoglobin 12.0 - 15.0 g/dL 41.3  24.4  01.0   Hematocrit 36.0 - 46.0 % 54.4  46.0  43.1   Platelets 150 - 400 K/uL 308   356.0      MYOCARDIAL PERFUSION IMAGING   The study is normal. The study is low risk.   horizontal ST depression (II, III, aVF and V6) was noted.   LV perfusion is normal. There is no evidence of ischemia. There is no  evidence of infarction.   Left ventricular function is normal. Nuclear stress EF: 69 %. The left  ventricular ejection fraction is hyperdynamic (>65%). End diastolic cavity  size is normal. End systolic cavity size is normal.  Normal perfusion Low risk study  Current Medications, Allergies, Past Medical History, Past Surgical History, Family History and Social History were reviewed in [employer] record.   Physical Exam: General: Well developed, well nourished, no acute  distress Head: Normocephalic and atraumatic Eyes: Sclerae anicteric, EOMI Ears: Normal auditory acuity Mouth: No deformities or lesions noted Lungs: Clear throughout to auscultation Heart: Regular rate and rhythm; No murmurs, rubs or bruits Abdomen: Soft, non tender and non distended. No masses, hepatosplenomegaly or hernias noted. Normal Bowel sounds Rectal: Not done Musculoskeletal: Symmetrical with no gross deformities  Pulses:  Normal pulses noted Extremities: No edema or deformities noted Neurological: Alert oriented x 4, grossly nonfocal Psychological:  Alert and cooperative. Normal mood and affect   Barbee Mamula T. Russella Dar, MD 02/13/2023, 9:49 AM

## 2023-02-14 ENCOUNTER — Ambulatory Visit: Payer: 59

## 2023-02-14 DIAGNOSIS — E538 Deficiency of other specified B group vitamins: Secondary | ICD-10-CM

## 2023-02-14 MED ORDER — CYANOCOBALAMIN 1000 MCG/ML IJ SOLN
1000.0000 ug | Freq: Once | INTRAMUSCULAR | Status: AC
Start: 2023-02-14 — End: 2023-02-14
  Administered 2023-02-14: 1000 ug via INTRAMUSCULAR

## 2023-02-14 NOTE — Progress Notes (Signed)
Pt here for monthly B12 injection per Dr. Yetta Barre  B12 given IM. and pt tolerated injection well.

## 2023-02-22 DIAGNOSIS — G4733 Obstructive sleep apnea (adult) (pediatric): Secondary | ICD-10-CM | POA: Diagnosis not present

## 2023-02-26 DIAGNOSIS — G4733 Obstructive sleep apnea (adult) (pediatric): Secondary | ICD-10-CM | POA: Diagnosis not present

## 2023-03-03 ENCOUNTER — Other Ambulatory Visit: Payer: Self-pay | Admitting: Gastroenterology

## 2023-03-05 ENCOUNTER — Ambulatory Visit: Payer: 59

## 2023-03-06 ENCOUNTER — Ambulatory Visit
Admission: RE | Admit: 2023-03-06 | Discharge: 2023-03-06 | Disposition: A | Discharge status: Home / Self Care | Payer: 59 | Source: Ambulatory Visit | Attending: Internal Medicine | Admitting: Internal Medicine

## 2023-03-06 DIAGNOSIS — Z1231 Encounter for screening mammogram for malignant neoplasm of breast: Secondary | ICD-10-CM

## 2023-03-11 ENCOUNTER — Telehealth: Payer: Self-pay

## 2023-03-11 DIAGNOSIS — I70213 Atherosclerosis of native arteries of extremities with intermittent claudication, bilateral legs: Secondary | ICD-10-CM

## 2023-03-11 NOTE — Telephone Encounter (Signed)
Patient called into the office with complaints of increased Right lower extremity pain. She stated it just started in the past couple days. She is having difficulty with walking and stated that her leg feels like it is going to give out on her. She is unsure if she has any swelling in her leg, she stated that her stocking is "leaving indent marks". She denied rest pain at this time. I offered patient an appointment for this Wednesday for an [place] and visit with the PA. Patient accepted. Appointment scheduled

## 2023-03-13 ENCOUNTER — Ambulatory Visit (HOSPITAL_COMMUNITY)
Admission: RE | Admit: 2023-03-13 | Discharge: 2023-03-13 | Disposition: A | Discharge status: Home / Self Care | Payer: 59 | Source: Ambulatory Visit | Attending: Vascular Surgery | Admitting: Vascular Surgery

## 2023-03-13 ENCOUNTER — Ambulatory Visit (INDEPENDENT_AMBULATORY_CARE_PROVIDER_SITE_OTHER): Payer: 59 | Admitting: Physician Assistant

## 2023-03-13 VITALS — BP 128/77 | HR 69 | Temp 97.1°F | Ht 60.0 in | Wt 229.2 lb

## 2023-03-13 DIAGNOSIS — I70213 Atherosclerosis of native arteries of extremities with intermittent claudication, bilateral legs: Secondary | ICD-10-CM | POA: Insufficient documentation

## 2023-03-13 DIAGNOSIS — M25561 Pain in right knee: Secondary | ICD-10-CM | POA: Diagnosis not present

## 2023-03-13 LAB — VAS US ABI WITH/WO TBI
Left ABI: 1.12
Right ABI: 0.66

## 2023-03-13 NOTE — Progress Notes (Signed)
[facility] HISTORY AND PHYSICAL   History of Present Illness:  Patient is a 67 y.o. year old female who presents for evaluation of right LE with increased pain.  She was last seen in our office by Dr. Myra Gianotti for follow up.   She had been having problems with right toe pain for several weeks.  This had been injected by podiatry, however she was continuing to complain of pain that goes to her big heel to her toe.  She was taking Tylenol to alleviate her symptoms.  Her symptoms were further aggravated by walking.  Her toe pressure on the right was 0.  Her ABI was 0.64 with monophasic waveforms.  She underwent angiography on 03/20/2022.  She was found to have no significant aortoiliac or outflow disease.  She did have occlusion of all tibial vessels with her minimal reconstitution on the foot.   Dr. Myra Gianotti was able to open the tibioperoneal trunk and proximal peroneal artery and treat this with balloon angioplasty.  He felt that she did not have any further options for revascularization.  She is back today for follow-up.  She is complaining of coldness in her right GT  and new right knee pain/ache with instability.    She does not have any open wounds.      Past Medical History:  Diagnosis Date   Abrasion 05/10/2022   right foot   Anal fistula    Anticoagulated    plavix and asa--- managed by vascular, dr Myra Gianotti   Arthritis    B12 deficiency    CKD (chronic kidney disease), stage I    [facility] --- samantha Waldo Damian PA (lov in epic/ media tab 02-23-2022)   COPD (chronic obstructive pulmonary disease) (HCC)    Coronary artery calcification seen on CAT scan    cardiologist--- dr Anne Fu;    nuclear stress test 10-29-2017  normal perfusion , nuclear ef 60%;   echo 10-16-2017  ef 55-60%, mild TR   Crohn's disease of both small and large intestine (HCC)    followed by dr stark  (GI   Full dentures    GAD (generalized anxiety disorder)    History of acute  pancreatitis    secondary to azathioprine   History of small bowel obstruction 11/24/2014   admission in epic  for crohn's flare w/ sbo and abscess s/p  surgical intervention   History of squamous cell carcinoma of skin 06/11/2017   excision left arm   Hyperlipidemia, mixed    followed by cardiology and pcp;   statin intolerate   MDD (major depressive disorder)    OSA on CPAP    followed by dr Peggye Ley ;  (05-16-2022  per pt uses nightly)   study in epic 01-05-2020  AHI 15.4/hr;  does not use cpap since weight loss 1 and 1/2 years ago   Perianal lesion    Peripheral vascular disease (HCC) 02/2022   vascular--- dr Myra Gianotti;   03-20-2022  s/p  balloon angioplasty right occluded peroneal artery, right anterior tibial artery, right tibioperoneal truck   PONV (postoperative nausea and vomiting)    Pre-diabetes    Resistant hypertension    followed by pcp and cardiology   Swelling of right foot 05/10/2022   Vitamin D deficiency     Past Surgical History:  Procedure Laterality Date   ABDOMINAL AORTOGRAM W/LOWER EXTREMITY N/A 03/20/2022   Procedure: ABDOMINAL AORTOGRAM W/LOWER EXTREMITY;  Surgeon: Nada Libman, MD;  Location: [facility];  Service: Cardiovascular;  Laterality: N/A;   ABDOMINAL HYSTERECTOMY     age 78;  per pt thinks ovaries were removed   APPENDECTOMY     teen   APPLICATION OF WOUND VAC  11/24/2014   Procedure: APPLICATION OF WOUND VAC;  Surgeon: Manus Rudd, MD;  Location: [facility];  Service: General;;   BOWEL RESECTION N/A 11/24/2014   Procedure: SMALL BOWEL RESECTION;  Surgeon: Manus Rudd, MD;  Location: [facility];  Service: General;  Laterality: N/A;   BREAST EXCISIONAL BIOPSY Right 1972   benign   LAPAROTOMY N/A 11/24/2014   Procedure: EXPLORATORY LAPAROTOMY EXTENSIVE LYSIS OF ADHESIONS SAMLL BOWEL RESECTION RIGHT HEMI COLECTOMY WOUND VAC APPLICATION. ;  Surgeon: Manus Rudd, MD;  Location: [facility];  Service: General;  Laterality: N/A;   LYSIS OF ADHESION   11/24/2014   Procedure: LYSIS OF ADHESION (3 HRS);  Surgeon: Manus Rudd, MD;  Location: [facility];  Service: General;;   PARTIAL COLECTOMY  11/24/2014   Procedure: RIGHT HEMI COLECTOMY;  Surgeon: Manus Rudd, MD;  Location: [facility];  Service: General;;   PERIPHERAL VASCULAR BALLOON ANGIOPLASTY  03/20/2022   Procedure: PERIPHERAL VASCULAR BALLOON ANGIOPLASTY;  Surgeon: Nada Libman, MD;  Location: [facility];  Service: Cardiovascular;;  AT and Peroneal   SMALL INTESTINE SURGERY     age 14  (for crohn's disease)   TONSILLECTOMY     teen    ROS:   General:  No weight loss, Fever, chills  HEENT: No recent headaches, no nasal bleeding, no visual changes, no sore throat  Neurologic: No dizziness, blackouts, seizures. No recent symptoms of stroke or mini- stroke. No recent episodes of slurred speech, or temporary blindness.  Cardiac: No recent episodes of chest pain/pressure, no shortness of breath at rest.  No shortness of breath with exertion.  Denies history of atrial fibrillation or irregular heartbeat  Vascular: No history of rest pain in feet.  No history of claudication.  No history of non-healing ulcer, No history of DVT   Pulmonary: No home oxygen, no productive cough, no hemoptysis,  No asthma or wheezing  Musculoskeletal:  [ ]  Arthritis, [ ]  Low back pain,  [ ]  Joint pain  Hematologic:No history of hypercoagulable state.  No history of easy bleeding.  No history of anemia  Gastrointestinal: No hematochezia or melena,  No gastroesophageal reflux, no trouble swallowing  Urinary: [ ]  chronic Kidney disease, [ ]  on HD - [ ]  MWF or [ ]  TTHS, [ ]  Burning with urination, [ ]  Frequent urination, [ ]  Difficulty urinating;   Skin: No rashes  Psychological: No history of anxiety,  No history of depression  Social History Social History   Tobacco Use   Smoking status: Every Day    Current packs/day: 0.75    Average packs/day: 0.8 packs/day for 48.0 years (36.0 ttl  pk-yrs)    Types: Cigarettes   Smokeless tobacco: Never   Tobacco comments:    05-16-2022  per pt started smoking as a teen and trying to quit  Vaping Use   Vaping status: Never Used  Substance Use Topics   Alcohol use: No   Drug use: Never    Family History Family History  Problem Relation Age of Onset   Hypertension Mother    Ulcerative colitis Mother    Crohn's disease Mother    Heart disease Mother    Kidney disease Mother    Arthritis Father    Brain cancer Father    Diabetes  Father    Alcohol abuse Paternal Uncle    Diabetes Paternal Grandmother    Stroke Paternal Grandmother    Heart failure Sister    Kidney disease Sister    Heart failure Brother    Other Sister        prediabetes   Kidney cancer Brother     Allergies  Allergies  Allergen Reactions   Azathioprine Other (See Comments)    Side effects to pancrease      Crestor [Rosuvastatin Calcium] Other (See Comments)    Muscle and joint aches   Lipitor [Atorvastatin Calcium] Other (See Comments)    Muscle cramps   Semaglutide Other (See Comments)    Constipation    Nsaids Other (See Comments)    Crohn's disease = NO  NSIADs   Lisinopril     Uncontrollable head shaking      Current Outpatient Medications  Medication Sig Dispense Refill   albuterol (PROAIR HFA) 108 (90 Base) MCG/ACT inhaler TAKE 2 PUFFS BY MOUTH EVERY 6 HOURS AS NEEDED FOR WHEEZE OR SHORTNESS OF BREATH (Patient taking differently: Inhale 2 puffs into the lungs every 6 (six) hours as needed for shortness of breath or wheezing. TAKE 2 PUFFS BY MOUTH EVERY 6 HOURS AS NEEDED FOR WHEEZE OR SHORTNESS OF BREATH) 18 g 5   amLODipine (NORVASC) 10 MG tablet TAKE 1 TABLET BY MOUTH DAILY 100 tablet 0   budesonide (ENTOCORT EC) 3 MG 24 hr capsule TAKE 3 CAPSULES (9 MG TOTAL) BY MOUTH DAILY. 270 capsule 0   buPROPion (WELLBUTRIN XL) 150 MG 24 hr tablet Take 1 tablet (150 mg total) by mouth daily. 100 tablet 1   Cholecalciferol (VITAMIN D3) 1.25 MG  (50000 UT) capsule TAKE 1 CAPSULE BY MOUTH ONE TIME PER WEEK 12 capsule 0   clonazePAM (KLONOPIN) 0.5 MG tablet Take 1 tablet (0.5 mg total) by mouth 2 (two) times daily as needed for anxiety. (Patient taking differently: Take 0.5 mg by mouth 2 (two) times daily as needed for anxiety.) 60 tablet 3   clopidogrel (PLAVIX) 75 MG tablet Take 1 tablet (75 mg total) by mouth daily. 30 tablet 11   [employer] PAIN RELIEF EXTRA STRENGTH 500 MG tablet TAKE 1 TABLET BY MOUTH EVERY 8 HOURS AS NEEDED FOR MODERATE PAIN (Patient taking differently: Take 500-1,000 mg by mouth every 8 (eight) hours as needed for moderate pain.) 60 tablet 5   Cyanocobalamin (B-12 COMPLIANCE INJECTION) 1000 MCG/ML KIT Inject as directed every 30 (thirty) days.     ezetimibe (ZETIA) 10 MG tablet TAKE 1 TABLET BY MOUTH DAILY 90 tablet 3   fluticasone (FLONASE) 50 MCG/ACT nasal spray Place 2 sprays into both nostrils daily. (Patient taking differently: Place 2 sprays into both nostrils daily as needed for allergies.) 48 g 3   lamoTRIgine (LAMICTAL) 200 MG tablet TAKE 1 TABLET BY MOUTH AT  BEDTIME 100 tablet 0   nicotine (NICODERM CQ - DOSED IN MG/24 HOURS) 14 mg/24hr patch Place 1 patch (14 mg total) onto the skin daily. 28 patch 0   nicotine (NICODERM CQ - DOSED IN MG/24 HOURS) 21 mg/24hr patch Place 1 patch (21 mg total) onto the skin daily. (Patient not taking: Reported on 02/13/2023) 28 patch 0   nicotine (NICODERM CQ - DOSED IN MG/24 HR) 7 mg/24hr patch Place 1 patch (7 mg total) onto the skin daily. (Patient not taking: Reported on 02/13/2023) 28 patch 0   Pitavastatin Calcium 1 MG TABS Take 1 tablet (1 mg total) by  mouth daily. 90 tablet 1   Potassium Citrate 15 MEQ (1620 MG) TBCR TAKE 1 TABLET BY MOUTH IN THE  MORNING AT NOON, AND AT BEDTIME 300 tablet 0   spironolactone (ALDACTONE) 100 MG tablet Take 1 tablet (100 mg total) by mouth daily. 90 tablet 0   STIOLTO RESPIMAT 2.5-2.5 MCG/ACT AERS USE 2 INHALATIONS BY MOUTH  DAILY (Patient taking  differently: Inhale 2 each into the lungs daily as needed.) 12 g 1   varenicline (CHANTIX CONTINUING MONTH PAK) 1 MG tablet Take 1 tablet (1 mg total) by mouth 2 (two) times daily. (Patient not taking: Reported on 02/13/2023) 60 tablet 3   Varenicline Tartrate, Starter, (CHANTIX STARTING MONTH PAK) 0.5 MG X 11 & 1 MG X 42 TBPK Take one 0.5 mg tablet by mouth once daily for 3 days, then increase to one 0.5 mg tablet twice daily for 4 days, then increase to one 1 mg tablet twice daily. (Patient not taking: Reported on 02/13/2023) 53 each 0   No current facility-administered medications for this visit.    Physical Examination  Vitals:   03/13/23 1301  BP: 128/77  Pulse: 69  Temp: (!) 97.1 F (36.2 C)  SpO2: 94%  Weight: 229 lb 3.2 oz (104 kg)  Height: 5' (1.524 m)    Body mass index is 44.76 kg/m.  General:  Alert and oriented, no acute distress HEENT: Normal Neck: No bruit or JVD Pulmonary: Clear to auscultation bilaterally Cardiac: Regular Rate and Rhythm without murmur Abdomen: Soft, non-tender, non-distended, no mass, no scars Skin: No rash, no open wounds Extremity Pulses: palpable L PT, non palpable right pulses Musculoskeletal: No deformity or edema  Neurologic: Upper and lower extremity motor 5/5 and symmetric  DATA:  ABI Findings:  +-----+------------------+-----+----------+--------+  RightRt Pressure (mmHg)IndexWaveform  Comment   +-----+------------------+-----+----------+--------+  PTA 97                0.61 biphasic            +-----+------------------+-----+----------+--------+  DP  105               0.66 monophasic          +-----+------------------+-----+----------+--------+   +---------+------------------+-----+----------+-------+  Left    Lt Pressure (mmHg)IndexWaveform  Comment  +---------+------------------+-----+----------+-------+  Brachial 160                                        +---------+------------------+-----+----------+-------+  PTA     165               1.03 triphasic          +---------+------------------+-----+----------+-------+  DP      179               1.12 monophasic         +---------+------------------+-----+----------+-------+  Great Toe96                0.60                    +---------+------------------+-----+----------+-------+   +-------+-----------+-----------+------------+------------+  ABI/TBIToday's ABIToday's TBIPrevious ABIPrevious TBI  +-------+-----------+-----------+------------+------------+  Right 0.66       absent     0.83        absent        +-------+-----------+-----------+------------+------------+  Left  1.12       0.60       1.12  0.32          +-------+-----------+-----------+------------+------------+     Right ABIs appear decreased. Left ABIs appear essentially unchanged  compared to prior study on 04/23/2022.    Summary:  Right: Resting right ankle-brachial index indicates moderate right lower  extremity arterial disease.   Left: Resting left ankle-brachial index is within normal range. The left  toe-brachial index is abnormal.    ASSESSMENT/PLAN:  PAD right LE with tibial disease  I could not re produce her right knee pain.  She did state it stared a week ago and has gotten some better.  She has been taking tylenol for pain control.  I would recommend an orthopedic visit if the right knee pain persist.   She denies increased pain in the right LE and her ABI's have demonstrated a slight decrease.  Unfortunately  the patient has diffuse disease out onto the foot. She has no further options for revascularization.  I recommended she try her best to protect her feet and walk as much as she can to demand continue inflow.   She will call us if she has an open wound or rest pain.  She is at high risk of amputation if these things happen and she understands.        Mosetta Pigeon PA-C [facility] Office: [phone]  MD in clinic [place]

## 2023-03-21 ENCOUNTER — Other Ambulatory Visit: Payer: Self-pay | Admitting: Internal Medicine

## 2023-03-21 DIAGNOSIS — I10 Essential (primary) hypertension: Secondary | ICD-10-CM

## 2023-03-22 ENCOUNTER — Other Ambulatory Visit: Payer: Self-pay | Admitting: Surgery

## 2023-03-28 ENCOUNTER — Encounter: Payer: Self-pay | Admitting: Adult Health

## 2023-03-29 DIAGNOSIS — G4733 Obstructive sleep apnea (adult) (pediatric): Secondary | ICD-10-CM | POA: Diagnosis not present

## 2023-04-03 ENCOUNTER — Other Ambulatory Visit: Payer: Self-pay | Admitting: Internal Medicine

## 2023-04-03 DIAGNOSIS — E876 Hypokalemia: Secondary | ICD-10-CM

## 2023-04-03 DIAGNOSIS — I10 Essential (primary) hypertension: Secondary | ICD-10-CM

## 2023-04-08 ENCOUNTER — Telehealth: Payer: Self-pay

## 2023-04-08 NOTE — Patient Outreach (Signed)
Attempted to contact patient regarding care gaps. Left voicemail for patient to return my call at [phone].  Nicholes Rough, CMA Care Guide [facility]

## 2023-04-12 ENCOUNTER — Other Ambulatory Visit: Payer: Self-pay | Admitting: Internal Medicine

## 2023-04-12 DIAGNOSIS — F321 Major depressive disorder, single episode, moderate: Secondary | ICD-10-CM

## 2023-04-15 ENCOUNTER — Other Ambulatory Visit: Payer: Self-pay

## 2023-04-15 ENCOUNTER — Telehealth: Payer: Self-pay | Admitting: Internal Medicine

## 2023-04-15 ENCOUNTER — Other Ambulatory Visit: Payer: Self-pay | Admitting: Internal Medicine

## 2023-04-15 DIAGNOSIS — I10 Essential (primary) hypertension: Secondary | ICD-10-CM

## 2023-04-15 DIAGNOSIS — T502X5A Adverse effect of carbonic-anhydrase inhibitors, benzothiadiazides and other diuretics, initial encounter: Secondary | ICD-10-CM

## 2023-04-15 MED ORDER — SPIRONOLACTONE 100 MG PO TABS: 100.0000 mg | ORAL_TABLET | Freq: Every day | ORAL | 0 refills | Status: DC

## 2023-04-15 NOTE — Telephone Encounter (Signed)
Medication refill sent to Dr. Yetta Barre

## 2023-04-15 NOTE — Telephone Encounter (Signed)
Prescription Request  04/15/2023  LOV: 11/05/2022  Pt wanting all medications sent to [facility]   What is the name of the medication or equipment? spironolactone (ALDACTONE) 100 MG tablet   Have you contacted your pharmacy to request a refill? No   Which pharmacy would you like this sent to?     [facility] - [place], [place] - [address] [place] [address] Phone: [phone] Fax: [phone] Patient notified that their request is being sent to the clinical staff for review and that they should receive a response within 2 business days.   Please advise at Mobile [phone] (mobile)

## 2023-04-24 ENCOUNTER — Ambulatory Visit: Payer: 59

## 2023-04-24 DIAGNOSIS — E538 Deficiency of other specified B group vitamins: Secondary | ICD-10-CM

## 2023-04-24 DIAGNOSIS — Z23 Encounter for immunization: Secondary | ICD-10-CM | POA: Diagnosis not present

## 2023-04-24 MED ORDER — CYANOCOBALAMIN 1000 MCG/ML IJ SOLN
1000.0000 ug | Freq: Once | INTRAMUSCULAR | Status: AC
  Administered 2023-04-24: 1000 ug via INTRAMUSCULAR

## 2023-04-24 NOTE — Progress Notes (Signed)
After obtaining consent, and per orders of Dr. Yetta Barre, injection of B12 was given by Lorelle Gibbs, RN, in Right deltoid. Patient tolerated well instructed to report any adverse reaction to me immediately.   Patient visits today to receive her HD Flu vaccine, in her left deltoid. Patient was informed and tolerated well. Patient was notified to reach out to [place] if needed.

## 2023-04-25 ENCOUNTER — Encounter: Payer: Self-pay | Admitting: Internal Medicine

## 2023-04-25 DIAGNOSIS — L720 Epidermal cyst: Secondary | ICD-10-CM | POA: Diagnosis not present

## 2023-04-29 LAB — HM DIABETES FOOT EXAM

## 2023-05-10 ENCOUNTER — Encounter: Payer: Self-pay | Admitting: Internal Medicine

## 2023-05-15 DIAGNOSIS — G4733 Obstructive sleep apnea (adult) (pediatric): Secondary | ICD-10-CM | POA: Diagnosis not present

## 2023-05-16 ENCOUNTER — Ambulatory Visit: Payer: 59 | Admitting: Internal Medicine

## 2023-05-16 ENCOUNTER — Encounter: Payer: Self-pay | Admitting: Internal Medicine

## 2023-05-16 VITALS — BP 138/78 | HR 71 | Temp 98.1°F | Resp 16 | Ht 60.0 in | Wt 226.0 lb

## 2023-05-16 DIAGNOSIS — E538 Deficiency of other specified B group vitamins: Secondary | ICD-10-CM | POA: Diagnosis not present

## 2023-05-16 DIAGNOSIS — I10 Essential (primary) hypertension: Secondary | ICD-10-CM

## 2023-05-16 DIAGNOSIS — R7303 Prediabetes: Secondary | ICD-10-CM | POA: Diagnosis not present

## 2023-05-16 DIAGNOSIS — B370 Candidal stomatitis: Secondary | ICD-10-CM | POA: Diagnosis not present

## 2023-05-16 LAB — CBC WITH DIFFERENTIAL/PLATELET
Basophils Absolute: 0 10*3/uL (ref 0.0–0.1)
Basophils Relative: 0.3 % (ref 0.0–3.0)
Eosinophils Absolute: 0.1 10*3/uL (ref 0.0–0.7)
Eosinophils Relative: 0.7 % (ref 0.0–5.0)
HCT: 44.8 % (ref 36.0–46.0)
Hemoglobin: 14.5 g/dL (ref 12.0–15.0)
Lymphocytes Relative: 25.1 % (ref 12.0–46.0)
Lymphs Abs: 3.1 10*3/uL (ref 0.7–4.0)
MCHC: 32.4 g/dL (ref 30.0–36.0)
MCV: 97.3 fL (ref 78.0–100.0)
Monocytes Absolute: 0.9 10*3/uL (ref 0.1–1.0)
Monocytes Relative: 7.1 % (ref 3.0–12.0)
Neutro Abs: 8.3 10*3/uL — ABNORMAL HIGH (ref 1.4–7.7)
Neutrophils Relative %: 66.8 % (ref 43.0–77.0)
Platelets: 292 10*3/uL (ref 150.0–400.0)
RBC: 4.61 Mil/uL (ref 3.87–5.11)
RDW: 14.8 % (ref 11.5–15.5)
WBC: 12.4 10*3/uL — ABNORMAL HIGH (ref 4.0–10.5)

## 2023-05-16 LAB — BASIC METABOLIC PANEL
BUN: 11 mg/dL (ref 6–23)
CO2: 29 meq/L (ref 19–32)
Calcium: 9.7 mg/dL (ref 8.4–10.5)
Chloride: 102 meq/L (ref 96–112)
Creatinine, Ser: 0.87 mg/dL (ref 0.40–1.20)
GFR: 68.88 mL/min (ref >60.00)
Glucose, Bld: 132 mg/dL — ABNORMAL HIGH (ref 70–99)
Potassium: 3.6 meq/L (ref 3.5–5.1)
Sodium: 140 meq/L (ref 135–145)

## 2023-05-16 LAB — VITAMIN B12: Vitamin B-12: 395 pg/mL (ref 211–911)

## 2023-05-16 LAB — HEMOGLOBIN A1C: Hgb A1c MFr Bld: 6.3 % (ref 4.6–6.5)

## 2023-05-16 MED ORDER — FLUCONAZOLE 150 MG PO TABS: 150.0000 mg | ORAL_TABLET | Freq: Once | ORAL | 0 refills | Status: AC

## 2023-05-16 NOTE — Progress Notes (Signed)
 Subjective:  Patient ID: April May, female    DOB: [birth year]  Age: 68 y.o. MRN: [id number]  CC: Hypertension, COPD, and Hyperlipidemia   HPI [facility] presents for f/up ---  Discussed the use of AI scribe software for clinical note transcription with the patient, who gave verbal consent to proceed.  History of Present Illness   The patient, a known smoker with a history of COPD, presented with a three-day history of white tongue coating. She is taking an antibiotic. The lesions are described as white and cottage cheese-like in appearance. The patient reported that the lesion bled upon wiping or applying pressure but did not cause any pain or discomfort.        Outpatient Medications Prior to Visit  Medication Sig Dispense Refill   albuterol  (PROAIR  HFA) 108 (90 Base) MCG/ACT inhaler TAKE 2 PUFFS BY MOUTH EVERY 6 HOURS AS NEEDED FOR WHEEZE OR SHORTNESS OF BREATH (Patient taking differently: Inhale 2 puffs into the lungs every 6 (six) hours as needed for shortness of breath or wheezing. TAKE 2 PUFFS BY MOUTH EVERY 6 HOURS AS NEEDED FOR WHEEZE OR SHORTNESS OF BREATH) 18 g 5   amLODipine  (NORVASC ) 10 MG tablet Take 1 tablet (10 mg total) by mouth daily. 100 tablet 0   budesonide  (ENTOCORT EC ) 3 MG 24 hr capsule TAKE 3 CAPSULES (9 MG TOTAL) BY MOUTH DAILY. 270 capsule 0   buPROPion  (WELLBUTRIN  XL) 150 MG 24 hr tablet TAKE 1 TABLET BY MOUTH DAILY 100 tablet 0   Cholecalciferol  (VITAMIN D3) 1.25 MG (50000 UT) capsule TAKE 1 CAPSULE BY MOUTH ONE TIME PER WEEK 12 capsule 0   clonazePAM  (KLONOPIN ) 0.5 MG tablet Take 1 tablet (0.5 mg total) by mouth 2 (two) times daily as needed for anxiety. (Patient taking differently: Take 0.5 mg by mouth 2 (two) times daily as needed for anxiety.) 60 tablet 3   clopidogrel  (PLAVIX ) 75 MG tablet TAKE 1 TABLET BY MOUTH EVERY DAY 90 tablet 3   [employer] PAIN RELIEF EXTRA STRENGTH 500 MG tablet TAKE 1 TABLET BY MOUTH EVERY 8 HOURS AS NEEDED FOR  MODERATE PAIN (Patient taking differently: Take 500-1,000 mg by mouth every 8 (eight) hours as needed for moderate pain (pain score 4-6).) 60 tablet 5   Cyanocobalamin  (B-12 COMPLIANCE INJECTION) 1000 MCG/ML KIT Inject as directed every 30 (thirty) days.     ezetimibe  (ZETIA ) 10 MG tablet TAKE 1 TABLET BY MOUTH DAILY 90 tablet 3   fluticasone  (FLONASE ) 50 MCG/ACT nasal spray Place 2 sprays into both nostrils daily. (Patient taking differently: Place 2 sprays into both nostrils daily as needed for allergies.) 48 g 3   lamoTRIgine  (LAMICTAL ) 200 MG tablet TAKE 1 TABLET BY MOUTH AT  BEDTIME 100 tablet 0   Potassium Citrate  15 MEQ (1620 MG) TBCR TAKE 1 TABLET BY MOUTH IN THE  MORNING AT NOON, AND AT BEDTIME 300 tablet 0   spironolactone  (ALDACTONE ) 100 MG tablet Take 1 tablet (100 mg total) by mouth daily. 30 tablet 0   STIOLTO RESPIMAT  2.5-2.5 MCG/ACT AERS USE 2 INHALATIONS BY MOUTH  DAILY (Patient taking differently: Inhale 2 each into the lungs daily as needed.) 12 g 1   triamcinolone  cream (KENALOG) 0.1 % Apply 1 Application topically as needed.     nicotine  (NICODERM CQ  - DOSED IN MG/24 HOURS) 14 mg/24hr patch Place 1 patch (14 mg total) onto the skin daily. (Patient not taking: Reported on 05/16/2023) 28 patch 0   nicotine  (NICODERM CQ  -  DOSED IN MG/24 HOURS) 21 mg/24hr patch Place 1 patch (21 mg total) onto the skin daily. (Patient not taking: Reported on 05/16/2023) 28 patch 0   nicotine  (NICODERM CQ  - DOSED IN MG/24 HR) 7 mg/24hr patch Place 1 patch (7 mg total) onto the skin daily. (Patient not taking: Reported on 05/16/2023) 28 patch 0   Pitavastatin  Calcium  1 MG TABS Take 1 tablet (1 mg total) by mouth daily. (Patient not taking: Reported on 05/16/2023) 90 tablet 1   varenicline  (CHANTIX  CONTINUING MONTH PAK) 1 MG tablet Take 1 tablet (1 mg total) by mouth 2 (two) times daily. (Patient not taking: Reported on 05/16/2023) 60 tablet 3   Varenicline  Tartrate, Starter, (CHANTIX  STARTING MONTH PAK) 0.5 MG X  11 & 1 MG X 42 TBPK Take one 0.5 mg tablet by mouth once daily for 3 days, then increase to one 0.5 mg tablet twice daily for 4 days, then increase to one 1 mg tablet twice daily. (Patient not taking: Reported on 05/16/2023) 53 each 0   No facility-administered medications prior to visit.    ROS Review of Systems  Constitutional:  Negative for appetite change, chills, diaphoresis, fatigue and fever.  HENT: Negative.    Respiratory:  Negative for cough, chest tightness, shortness of breath and wheezing.   Cardiovascular:  Negative for chest pain, palpitations and leg swelling.  Gastrointestinal:  Negative for abdominal pain, constipation, diarrhea, nausea and vomiting.  Genitourinary: Negative.  Negative for difficulty urinating and frequency.  Musculoskeletal:  Negative for arthralgias and myalgias.  Skin: Negative.  Negative for color change and pallor.  Neurological:  Negative for dizziness and weakness.  Hematological:  Negative for adenopathy. Does not bruise/bleed easily.  Psychiatric/Behavioral: Negative.      Objective:  BP 138/78 (BP Location: Left Arm, Patient Position: Sitting, Cuff Size: Normal)   Pulse 71   Temp 98.1 F (36.7 C) (Oral)   Resp 16   Ht 5' (1.524 m)   Wt 226 lb (102.5 kg)   LMP 05/14/1988   SpO2 97%   BMI 44.14 kg/m   BP Readings from Last 3 Encounters:  05/16/23 138/78  03/13/23 128/77  02/13/23 126/78    Wt Readings from Last 3 Encounters:  05/16/23 226 lb (102.5 kg)  03/13/23 229 lb 3.2 oz (104 kg)  02/13/23 233 lb 3.2 oz (105.8 kg)    Physical Exam Vitals reviewed.  Constitutional:      Appearance: Normal appearance.  HENT:     Nose: Nose normal.  Eyes:     General: No scleral icterus.    Conjunctiva/sclera: Conjunctivae normal.  Cardiovascular:     Rate and Rhythm: Normal rate and regular rhythm.     Heart sounds: No murmur heard.    No friction rub. No gallop.  Pulmonary:     Effort: Pulmonary effort is normal.     Breath  sounds: No stridor. No wheezing, rhonchi or rales.  Abdominal:     General: Abdomen is flat.     Palpations: There is no mass.     Tenderness: There is no abdominal tenderness. There is no guarding.     Hernia: No hernia is present.  Musculoskeletal:        General: Normal range of motion.     Cervical back: Neck supple.     Right lower leg: No edema.     Left lower leg: No edema.  Lymphadenopathy:     Cervical: No cervical adenopathy.  Skin:    General: Skin  is warm and dry.  Neurological:     General: No focal deficit present.     Mental Status: She is alert. Mental status is at baseline.  Psychiatric:        Mood and Affect: Mood normal.     Lab Results  Component Value Date   WBC 12.4 (H) 05/16/2023   HGB 14.5 05/16/2023   HCT 44.8 05/16/2023   PLT 292.0 05/16/2023   GLUCOSE 132 (H) 05/16/2023   CHOL 123 10/24/2022   TRIG 100.0 10/24/2022   HDL 55.00 10/24/2022   LDLCALC 48 10/24/2022   ALT 19 10/07/2022   AST 23 10/07/2022   NA 140 05/16/2023   K 3.6 05/16/2023   CL 102 05/16/2023   CREATININE 0.87 05/16/2023   BUN 11 05/16/2023   CO2 29 05/16/2023   TSH 2.97 10/24/2022   HGBA1C 6.3 05/16/2023   MICROALBUR <0.7 10/24/2022    VAS US  ABI WITH/WO TBI Result Date: 03/13/2023  LOWER EXTREMITY DOPPLER STUDY Patient Name:  Maleni Seyer  Date of Exam:   03/13/2023 Medical Rec #: [id number]                 Accession #:    [id number] Date of Birth: 25-Sep-1955                 Patient Gender: F Patient Age:   11 years Exam Location:  [facility] Procedure:      VAS US  ABI WITH/WO TBI Referring Phys: --------------------------------------------------------------------------------  Indications: Claudication, and peripheral artery disease. post-intervention              follow up              Trige call. Patient complaining that her right great toe feels cold              and right knee is giving out. High Risk Factors: Hypertension.  Vascular  Interventions: 03/20/22 abdominal aortogram with RLE runoff, right                         anterior tibial artery angioplasty, right tibioperoneal                         trunk angioplasty, right peroneal artery angioplasty. Performing Technologist: Geni Lodge RVS, RCS  Examination Guidelines: A complete evaluation includes at minimum, Doppler waveform signals and systolic blood pressure reading at the level of bilateral brachial, anterior tibial, and posterior tibial arteries, when vessel segments are accessible. Bilateral testing is considered an integral part of a complete examination. Photoelectric Plethysmograph (PPG) waveforms and toe systolic pressure readings are included as required and additional duplex testing as needed. Limited examinations for reoccurring indications may be performed as noted.  ABI Findings: +-----+------------------+-----+----------+--------+ RightRt Pressure (mmHg)IndexWaveform  Comment  +-----+------------------+-----+----------+--------+ PTA  97                0.61 biphasic           +-----+------------------+-----+----------+--------+ DP   105               0.66 monophasic         +-----+------------------+-----+----------+--------+ +---------+------------------+-----+----------+-------+ Left     Lt Pressure (mmHg)IndexWaveform  Comment +---------+------------------+-----+----------+-------+ Brachial 160                                      +---------+------------------+-----+----------+-------+  PTA      165               1.03 triphasic         +---------+------------------+-----+----------+-------+ DP       179               1.12 monophasic        +---------+------------------+-----+----------+-------+ Great Toe96                0.60                   +---------+------------------+-----+----------+-------+ +-------+-----------+-----------+------------+------------+ ABI/TBIToday's ABIToday's TBIPrevious ABIPrevious TBI  +-------+-----------+-----------+------------+------------+ Right  0.66       absent     0.83        absent       +-------+-----------+-----------+------------+------------+ Left   1.12       0.60       1.12        0.32         +-------+-----------+-----------+------------+------------+  Right ABIs appear decreased. Left ABIs appear essentially unchanged compared to prior study on 04/23/2022.  Summary: Right: Resting right ankle-brachial index indicates moderate right lower extremity arterial disease. Left: Resting left ankle-brachial index is within normal range. The left toe-brachial index is abnormal. *See table(s) above for measurements and observations.  Electronically signed by Penne Colorado MD on 03/13/2023 at 3:40:02 PM.    Final     Assessment & Plan:   Malignant hypertension- Her BP is adequately well controlled. -     Basic metabolic panel; Future  Vitamin B12 deficiency -     CBC with Differential/Platelet; Future -     Vitamin B12; Future  Prediabetes- A1C is up to 6.3%. -     Basic metabolic panel; Future -     Hemoglobin A1c; Future  Thrush, oral -     Fluconazole ; Take 1 tablet (150 mg total) by mouth once for 1 dose.  Dispense: 7 tablet; Refill: 0     Follow-up: Return in about 4 months (around 09/13/2023).  Debby Molt, MD

## 2023-05-16 NOTE — Patient Instructions (Signed)

## 2023-05-17 ENCOUNTER — Ambulatory Visit: Payer: Self-pay | Admitting: Internal Medicine

## 2023-05-17 ENCOUNTER — Encounter: Payer: Self-pay | Admitting: Internal Medicine

## 2023-05-17 NOTE — Telephone Encounter (Signed)
  Chief Complaint: medication question  Additional Notes:   Patient reports directions for medication prescribed for her oral thrush are unclear. Directions state she is only to take 1 pill for one dose one time, but patient reports she was given 7 pills and is not sure when she should start taking the rest of them. Patient is also concerned medication will interfere with her Plavix . Patient requesting immediate call back from office.    Copied from CRM [id number]. Topic: Clinical - Red Word Triage >> May 17, 2023  4:22 PM Brittany M wrote: Reason for CRM: Patient was given a medication for thrush and is unable to take due to her other medication. Dr Joshua gave her 7 pills and she is unsure about taking them Reason for Disposition  [1] Caller has NON-URGENT medicine question about med that PCP prescribed AND [2] triager unable to answer question  Answer Assessment - Initial Assessment Questions 1. NAME of MEDICINE: What medicine(s) are you calling about?     Fluconazole  2. QUESTION: What is your question? (e.g., double dose of medicine, side effect)     Patient reports directions for medication are unclear. Directions state she is only to take 1 pill for one dose, but patient reports she was given 7 pills and is not sure when she should start taking the rest of them. Patient is also concerned medication will interfere with her Plavix . 3. PRESCRIBER: Who prescribed the medicine? Reason: if prescribed by specialist, call should be referred to that group.     Dr. Joshua 4. SYMPTOMS: Do you have any symptoms? If Yes, ask: What symptoms are you having?  How bad are the symptoms (e.g., mild, moderate, severe)     Thrush still present  Protocols used: Medication Question Call-A-AH

## 2023-05-20 ENCOUNTER — Telehealth: Payer: Self-pay

## 2023-05-20 ENCOUNTER — Other Ambulatory Visit: Payer: Self-pay | Admitting: Internal Medicine

## 2023-05-20 DIAGNOSIS — F321 Major depressive disorder, single episode, moderate: Secondary | ICD-10-CM

## 2023-05-20 NOTE — Telephone Encounter (Signed)
 Copied from CRM [id number]. Topic: Clinical - Medication Question >> May 20, 2023 11:49 AM Laurier C wrote: Reason for CRM: Patient was prescribed but she is also on Plavix  and that medication could affect the new medication. Patient's pharmacy wants clarification on how long she should be taking the medication

## 2023-05-20 NOTE — Telephone Encounter (Signed)
 This has been handled by other means of communication (My chart)

## 2023-05-21 ENCOUNTER — Telehealth: Payer: Self-pay | Admitting: Internal Medicine

## 2023-05-21 ENCOUNTER — Other Ambulatory Visit: Payer: Self-pay | Admitting: Internal Medicine

## 2023-05-21 DIAGNOSIS — B370 Candidal stomatitis: Secondary | ICD-10-CM

## 2023-05-21 MED ORDER — CLOTRIMAZOLE 10 MG MT TROC: 10.0000 mg | Freq: Every day | OROMUCOSAL | 0 refills | Status: AC

## 2023-05-21 NOTE — Telephone Encounter (Signed)
 Pt is still waiting on instructions for which medication she should be taking if she can't take them both as advised by the pharmacy.   Please let me know asap so I can return the pt's call.

## 2023-05-21 NOTE — Telephone Encounter (Signed)
 This is being handled in another phone call encounter.

## 2023-05-21 NOTE — Telephone Encounter (Signed)
 The pharmacy told the patient that she can't take diflucan for 7 days while taking plavix, what should she do ?

## 2023-05-21 NOTE — Telephone Encounter (Signed)
 Please advise the patient my chart messages I sent you yesterday and inform me of what I should tell her in regards to the instructions .

## 2023-05-27 ENCOUNTER — Ambulatory Visit: Payer: 59

## 2023-05-28 ENCOUNTER — Other Ambulatory Visit: Payer: Self-pay | Admitting: Internal Medicine

## 2023-05-28 ENCOUNTER — Encounter: Payer: Self-pay | Admitting: Family Medicine

## 2023-05-28 ENCOUNTER — Ambulatory Visit (INDEPENDENT_AMBULATORY_CARE_PROVIDER_SITE_OTHER): Payer: 59 | Admitting: Family Medicine

## 2023-05-28 VITALS — BP 130/80 | HR 75 | Temp 98.2°F | Ht 60.0 in | Wt 224.0 lb

## 2023-05-28 DIAGNOSIS — E876 Hypokalemia: Secondary | ICD-10-CM

## 2023-05-28 DIAGNOSIS — F321 Major depressive disorder, single episode, moderate: Secondary | ICD-10-CM

## 2023-05-28 DIAGNOSIS — E538 Deficiency of other specified B group vitamins: Secondary | ICD-10-CM | POA: Diagnosis not present

## 2023-05-28 DIAGNOSIS — I10 Essential (primary) hypertension: Secondary | ICD-10-CM

## 2023-05-28 DIAGNOSIS — R0981 Nasal congestion: Secondary | ICD-10-CM

## 2023-05-28 DIAGNOSIS — J069 Acute upper respiratory infection, unspecified: Secondary | ICD-10-CM | POA: Diagnosis not present

## 2023-05-28 MED ORDER — CYANOCOBALAMIN 1000 MCG/ML IJ SOLN
1000.0000 ug | Freq: Once | INTRAMUSCULAR | Status: AC
  Administered 2023-05-28: 1000 ug via INTRAMUSCULAR

## 2023-05-28 NOTE — Patient Instructions (Signed)
 May take mucinex over the counter  May also use flonase 2 sprays daily while still having symptoms.   Follow-up with me for new or worsening symptoms.

## 2023-05-28 NOTE — Progress Notes (Signed)
   Acute Office Visit  Subjective:     Patient ID: April May, female    DOB: 05/17/1955, 68 y.o.   MRN: [id number]  Chief Complaint  Patient presents with   Sore Throat    Scratchy sore throat, headache, sweating, loss of taste and smell. Treating with over the counter medications (Tylenol  severe cold/flu) but seems to have worsened. All symptoms for the past 3 days    HPI Patient is in today for evaluation of nasal congestion, runny nose, headaches, fatigue, ST, for the last 3 days. Has not attempted OTC treatment. 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 Denies abdominal pain, nausea, vomiting, diarrhea, rash, fever, chills, other symptoms.  Medical hx as outlined below.  Inquiring about vitamin B12 injection today as well, she had this scheduled for yesterday, but canceled due to not feeling well.  ROS Per HPI      Objective:    BP 130/80   Pulse 75   Temp 98.2 F (36.8 C)   Ht 5' (1.524 m)   Wt 224 lb (101.6 kg)   LMP 05/14/1988   SpO2 97%   BMI 43.75 kg/m    Physical Exam Vitals and nursing note reviewed.  Constitutional:      General: She is not in acute distress.    Appearance: She is obese.     Comments: Appears fatigued   HENT:     Head: Normocephalic and atraumatic.     Right Ear: There is impacted cerumen.     Left Ear: There is impacted cerumen.     Nose: Congestion and rhinorrhea (clear) present.     Mouth/Throat:     Mouth: Mucous membranes are moist.     Pharynx: Oropharynx is clear. Posterior oropharyngeal erythema present. No oropharyngeal exudate.     Comments: Oropharyngeal cobblestoning   Eyes:     Extraocular Movements: Extraocular movements intact.  Cardiovascular:     Rate and Rhythm: Normal rate and regular rhythm.     Heart sounds: Normal heart sounds.  Pulmonary:     Effort: Pulmonary effort is normal. No respiratory distress.     Breath sounds: Normal breath sounds. No wheezing,  rhonchi or rales.  Musculoskeletal:     Cervical back: Normal range of motion and neck supple.  Lymphadenopathy:     Cervical: Cervical adenopathy present.  Skin:    General: Skin is warm and dry.  Neurological:     Mental Status: She is alert.    No results found for any visits on 05/28/23.      Assessment & Plan:  1. Upper respiratory tract infection, unspecified type (Primary)  - Likely viral -COVID, flu, RSV testing negative today  2. Nasal congestion  -May take Mucinex and use of Flonase  over-the-counter as needed  3. Vitamin B12 deficiency  - cyanocobalamin  (VITAMIN B12) injection 1,000 mcg   Meds ordered this encounter  Medications   cyanocobalamin  (VITAMIN B12) injection 1,000 mcg    Return if symptoms worsen or fail to improve.  Corean Ku, FNP

## 2023-05-29 ENCOUNTER — Encounter: Payer: Self-pay | Admitting: Internal Medicine

## 2023-05-31 ENCOUNTER — Encounter: Payer: Self-pay | Admitting: Family Medicine

## 2023-05-31 ENCOUNTER — Ambulatory Visit: Payer: Self-pay | Admitting: Internal Medicine

## 2023-05-31 ENCOUNTER — Telehealth: Payer: Self-pay | Admitting: Internal Medicine

## 2023-05-31 ENCOUNTER — Encounter: Payer: Self-pay | Admitting: Cardiology

## 2023-05-31 ENCOUNTER — Telehealth: Payer: Self-pay | Admitting: Gastroenterology

## 2023-05-31 MED ORDER — BUDESONIDE 3 MG PO CPEP: 9.0000 mg | ORAL_CAPSULE | Freq: Every day | ORAL | 3 refills | Status: AC

## 2023-05-31 NOTE — Telephone Encounter (Signed)
Copied from CRM [id number]. Topic: Clinical - Prescription Issue >> May 31, 2023 11:59 AM April May wrote: Reason for CRM: Patient request the antibiotic being sent does not interfere with the PLAVIX.

## 2023-05-31 NOTE — Telephone Encounter (Signed)
This RN attempted return phone call to patient. No answer. LVM. Will route to call back folder for further attempts.  Copied from CRM [id number]. Topic: General - Call Back - No Documentation >> May 31, 2023 11:54 AM April May wrote: Reason for CRM: Patient states April May told her to call back on Friday if she isn't feeling well to request antibiotics - patient is not feeling better.

## 2023-05-31 NOTE — Telephone Encounter (Signed)
You are DOD.  Please advise.   :)

## 2023-05-31 NOTE — Telephone Encounter (Signed)
Inbound call from patient stating she needs a refill for Budesonide. Please advise.

## 2023-05-31 NOTE — Telephone Encounter (Signed)
Called patient. No answer.  Left a voicemail for her to call back.  2nd attempt.

## 2023-05-31 NOTE — Telephone Encounter (Signed)
Budesonide refilled by Dr. Rhea Belton.  Follow up with Dr. Doy Hutching

## 2023-05-31 NOTE — Telephone Encounter (Signed)
Called patient and left a voicemail for a call back.  3rd attempt. Reason for Disposition . Third attempt to contact caller AND no contact made. Phone number verified.  Protocols used: No Contact or Duplicate Contact Call-A-AH

## 2023-05-31 NOTE — Telephone Encounter (Signed)
Please advise and send back to me so that I can call the patient and inform her.

## 2023-05-31 NOTE — Telephone Encounter (Signed)
Ok to refill Needs to see Dr. Doy Hutching for follow-up and to establish care

## 2023-06-02 ENCOUNTER — Encounter: Payer: Self-pay | Admitting: Family Medicine

## 2023-06-02 DIAGNOSIS — B9689 Other specified bacterial agents as the cause of diseases classified elsewhere: Secondary | ICD-10-CM

## 2023-06-03 ENCOUNTER — Ambulatory Visit: Payer: 59

## 2023-06-03 MED ORDER — AZITHROMYCIN 250 MG PO TABS: ORAL_TABLET | ORAL | 0 refills | Status: AC

## 2023-06-03 NOTE — Telephone Encounter (Signed)
Patient called office toady very upset stating that she needed an antibiotic. Antibiotic was sent in

## 2023-06-03 NOTE — Telephone Encounter (Signed)
Message sent in other encounter, please see mychart encounter

## 2023-06-03 NOTE — Telephone Encounter (Signed)
Please advise, per other mychart encounter pt states   "This is April May , I was told to request antibiotic if I was still not better by Friday or today, please make sure the antibiotic can be taken with Plavix. I do have Plavix and I need to be able to take the antibiotic with that medication. Also, I have a request for something for yeast infection along with the  antibiotics, the yeast gets very severe thank you. "

## 2023-06-06 ENCOUNTER — Other Ambulatory Visit: Payer: Self-pay

## 2023-06-06 DIAGNOSIS — I10 Essential (primary) hypertension: Secondary | ICD-10-CM

## 2023-06-06 DIAGNOSIS — E876 Hypokalemia: Secondary | ICD-10-CM

## 2023-06-06 MED ORDER — SPIRONOLACTONE 100 MG PO TABS: 100.0000 mg | ORAL_TABLET | Freq: Every day | ORAL | 1 refills | Status: DC

## 2023-06-14 ENCOUNTER — Telehealth: Payer: Self-pay | Admitting: Internal Medicine

## 2023-06-14 ENCOUNTER — Other Ambulatory Visit: Payer: Self-pay

## 2023-06-14 DIAGNOSIS — F411 Generalized anxiety disorder: Secondary | ICD-10-CM

## 2023-06-14 NOTE — Telephone Encounter (Signed)
Spoke with the patient. Advised her all of her medications was sent and she gave a verbal understanding.

## 2023-06-14 NOTE — Telephone Encounter (Unsigned)
Copied from CRM [id number]. Topic: Clinical - Prescription Issue >> Jun 14, 2023 11:09 AM Hector Shade B wrote: Reason for CRM: Patient is requesting a callback to [phone], patient states she keeps receiving messages from [facility] in regards to her medication refill she states they have informed her that Dr. Yetta Barre has not submitted refills for her medications and needs to speak with someone to clarify

## 2023-06-15 DIAGNOSIS — J069 Acute upper respiratory infection, unspecified: Secondary | ICD-10-CM | POA: Diagnosis not present

## 2023-06-15 DIAGNOSIS — J0140 Acute pansinusitis, unspecified: Secondary | ICD-10-CM | POA: Diagnosis not present

## 2023-06-15 DIAGNOSIS — H6123 Impacted cerumen, bilateral: Secondary | ICD-10-CM | POA: Diagnosis not present

## 2023-06-15 MED ORDER — CLONAZEPAM 0.5 MG PO TABS: 0.5000 mg | ORAL_TABLET | Freq: Two times a day (BID) | ORAL | 3 refills | Status: AC | PRN

## 2023-06-28 ENCOUNTER — Other Ambulatory Visit: Payer: Self-pay | Admitting: Cardiology

## 2023-07-01 ENCOUNTER — Encounter: Payer: Self-pay | Admitting: Cardiology

## 2023-07-10 ENCOUNTER — Ambulatory Visit (INDEPENDENT_AMBULATORY_CARE_PROVIDER_SITE_OTHER): Payer: 59

## 2023-07-10 DIAGNOSIS — E538 Deficiency of other specified B group vitamins: Secondary | ICD-10-CM

## 2023-07-10 MED ORDER — CYANOCOBALAMIN 1000 MCG/ML IJ SOLN
1000.0000 ug | Freq: Once | INTRAMUSCULAR | Status: AC
  Administered 2023-07-10: 1000 ug via INTRAMUSCULAR

## 2023-07-10 NOTE — Progress Notes (Signed)
 Patient is in office today for a nurse visit for B12 Injection, per PCP's order. Patient Injection was given in the  Right deltoid. Patient tolerated injection well.

## 2023-07-17 ENCOUNTER — Telehealth: Payer: Self-pay | Admitting: *Deleted

## 2023-07-17 NOTE — Telephone Encounter (Signed)
   Patient Name: April May  DOB: 16-Jul-1955 MRN: [id number]  Primary Cardiologist: Donato Schultz, MD  Chart reviewed as part of pre-operative protocol coverage. Given past medical history and time since last visit, based on ACC/AHA guidelines, [name] is at acceptable risk for the planned procedure without further cardiovascular testing.   The patient was advised that if she develops new symptoms prior to surgery to contact our office to arrange for a follow-up visit, and she verbalized understanding.  Patient is on Plavix for a history of PAD, managed by vascular surgery. Therefore, recommendations on  holding Plavix prior to procedure should come from managing provider, Dr. Myra Gianotti.   I will route this recommendation to the requesting party via Epic fax function and remove from pre-op pool.  Please call with questions.  Napoleon Form, Leodis Rains, NP 07/17/2023, 12:02 PM

## 2023-07-17 NOTE — Telephone Encounter (Signed)
   Pre-operative Risk Assessment    Patient Name: April May  DOB: Jun 23, 1955 MRN: [id number]   Date of last office visit: 10/19/22 DR. SKAINS Date of next office visit: NONE  Request for Surgical Clearance    Procedure:  Dental Extraction - Amount of Teeth to be Pulled:  3 TEETH FOR SURGICAL EXTRACTION  Date of Surgery:  Clearance TBD                                Surgeon:  DR. Luciana Axe, DMD Surgeon's Group or Practice Name:  [facility] Phone number:  [phone] Fax number:  [phone]   Type of Clearance Requested:   - Medical  - Pharmacy:  Hold Clopidogrel (Plavix)     Type of Anesthesia:  Local    Additional requests/questions:    Elpidio Anis   07/17/2023, 11:55 AM

## 2023-07-18 ENCOUNTER — Telehealth: Payer: Self-pay | Admitting: Cardiology

## 2023-07-18 NOTE — Telephone Encounter (Signed)
 Patient wants a call back on the status of her clearance.  Patient stated can leave voice message.

## 2023-07-18 NOTE — Telephone Encounter (Signed)
 Returned call to pt.  Left a detailed message for that from a Cardiac Standpoint, she has been cleared and her Plavix clearance will need to come from Dr. Estanislado Spire office, [facility].  If she has any further questions, she can call the office back.

## 2023-07-23 ENCOUNTER — Encounter: Payer: Self-pay | Admitting: Internal Medicine

## 2023-07-25 DIAGNOSIS — D225 Melanocytic nevi of trunk: Secondary | ICD-10-CM | POA: Diagnosis not present

## 2023-07-25 DIAGNOSIS — D2271 Melanocytic nevi of right lower limb, including hip: Secondary | ICD-10-CM | POA: Diagnosis not present

## 2023-07-25 DIAGNOSIS — Z1283 Encounter for screening for malignant neoplasm of skin: Secondary | ICD-10-CM | POA: Diagnosis not present

## 2023-07-30 ENCOUNTER — Other Ambulatory Visit: Payer: Self-pay | Admitting: Internal Medicine

## 2023-07-30 DIAGNOSIS — F321 Major depressive disorder, single episode, moderate: Secondary | ICD-10-CM

## 2023-08-01 ENCOUNTER — Other Ambulatory Visit: Payer: Self-pay | Admitting: Internal Medicine

## 2023-08-01 ENCOUNTER — Encounter: Payer: Self-pay | Admitting: Internal Medicine

## 2023-08-01 DIAGNOSIS — E559 Vitamin D deficiency, unspecified: Secondary | ICD-10-CM

## 2023-08-01 DIAGNOSIS — R7303 Prediabetes: Secondary | ICD-10-CM

## 2023-08-01 DIAGNOSIS — E538 Deficiency of other specified B group vitamins: Secondary | ICD-10-CM

## 2023-08-01 DIAGNOSIS — I10 Essential (primary) hypertension: Secondary | ICD-10-CM

## 2023-08-01 NOTE — Progress Notes (Unsigned)
 Lab Results  Component Value Date   WBC 12.4 (H) 05/16/2023   HGB 14.5 05/16/2023   HCT 44.8 05/16/2023   PLT 292.0 05/16/2023   GLUCOSE 132 (H) 05/16/2023   CHOL 123 10/24/2022   TRIG 100.0 10/24/2022   HDL 55.00 10/24/2022   LDLCALC 48 10/24/2022   ALT 19 10/07/2022   AST 23 10/07/2022   NA 140 05/16/2023   K 3.6 05/16/2023   CL 102 05/16/2023   CREATININE 0.87 05/16/2023   BUN 11 05/16/2023   CO2 29 05/16/2023   TSH 2.97 10/24/2022   HGBA1C 6.3 05/16/2023   MICROALBUR <0.7 10/24/2022

## 2023-08-07 ENCOUNTER — Other Ambulatory Visit (INDEPENDENT_AMBULATORY_CARE_PROVIDER_SITE_OTHER)

## 2023-08-07 ENCOUNTER — Ambulatory Visit (INDEPENDENT_AMBULATORY_CARE_PROVIDER_SITE_OTHER): Payer: 59

## 2023-08-07 DIAGNOSIS — I10 Essential (primary) hypertension: Secondary | ICD-10-CM

## 2023-08-07 DIAGNOSIS — R7303 Prediabetes: Secondary | ICD-10-CM | POA: Diagnosis not present

## 2023-08-07 DIAGNOSIS — E559 Vitamin D deficiency, unspecified: Secondary | ICD-10-CM | POA: Diagnosis not present

## 2023-08-07 DIAGNOSIS — E538 Deficiency of other specified B group vitamins: Secondary | ICD-10-CM | POA: Diagnosis not present

## 2023-08-07 LAB — CBC WITH DIFFERENTIAL/PLATELET
Basophils Absolute: 0.1 10*3/uL (ref 0.0–0.1)
Basophils Relative: 0.4 % (ref 0.0–3.0)
Eosinophils Absolute: 0.1 10*3/uL (ref 0.0–0.7)
Eosinophils Relative: 0.6 % (ref 0.0–5.0)
HCT: 44.9 % (ref 36.0–46.0)
Hemoglobin: 14.6 g/dL (ref 12.0–15.0)
Lymphocytes Relative: 28.3 % (ref 12.0–46.0)
Lymphs Abs: 3.4 10*3/uL (ref 0.7–4.0)
MCHC: 32.5 g/dL (ref 30.0–36.0)
MCV: 96.7 fl (ref 78.0–100.0)
Monocytes Absolute: 0.9 10*3/uL (ref 0.1–1.0)
Monocytes Relative: 7.6 % (ref 3.0–12.0)
Neutro Abs: 7.5 10*3/uL (ref 1.4–7.7)
Neutrophils Relative %: 63.1 % (ref 43.0–77.0)
Platelets: 312 10*3/uL (ref 150.0–400.0)
RBC: 4.64 Mil/uL (ref 3.87–5.11)
RDW: 15.6 % — ABNORMAL HIGH (ref 11.5–15.5)
WBC: 11.9 10*3/uL — ABNORMAL HIGH (ref 4.0–10.5)

## 2023-08-07 LAB — BASIC METABOLIC PANEL WITH GFR
BUN: 9 mg/dL (ref 6–23)
CO2: 28 meq/L (ref 19–32)
Calcium: 9.7 mg/dL (ref 8.4–10.5)
Chloride: 104 meq/L (ref 96–112)
Creatinine, Ser: 0.85 mg/dL (ref 0.40–1.20)
GFR: 70.71 mL/min (ref >60.00)
Glucose, Bld: 95 mg/dL (ref 70–99)
Potassium: 3.8 meq/L (ref 3.5–5.1)
Sodium: 139 meq/L (ref 135–145)

## 2023-08-07 LAB — MICROALBUMIN / CREATININE URINE RATIO
Creatinine,U: 258.1 mg/dL
Microalb Creat Ratio: 4.3 mg/g (ref 0.0–30.0)
Microalb, Ur: 1.1 mg/dL (ref 0.0–1.9)

## 2023-08-07 LAB — HEMOGLOBIN A1C: Hgb A1c MFr Bld: 5.9 % (ref 4.6–6.5)

## 2023-08-07 MED ORDER — CYANOCOBALAMIN 1000 MCG/ML IJ SOLN
1000.0000 ug | Freq: Once | INTRAMUSCULAR | Status: AC
  Administered 2023-08-07: 1000 ug via INTRAMUSCULAR

## 2023-08-07 NOTE — Progress Notes (Signed)
 Pt here for monthly B12 injection per   B12 given IM and pt tolerated injection well.  Pt responded well to injection in the left arm

## 2023-08-08 LAB — VITAMIN B12: Vitamin B-12: >1537 pg/mL — ABNORMAL HIGH (ref 211–911)

## 2023-08-08 LAB — URINALYSIS, ROUTINE W REFLEX MICROSCOPIC
Bilirubin Urine: NEGATIVE
Hgb urine dipstick: NEGATIVE
Leukocytes,Ua: NEGATIVE
Nitrite: NEGATIVE
RBC / HPF: NONE SEEN (ref 0–?)
Specific Gravity, Urine: 1.025 (ref 1.000–1.030)
Total Protein, Urine: NEGATIVE
Urine Glucose: NEGATIVE
Urobilinogen, UA: 0.2 (ref 0.0–1.0)
WBC, UA: NONE SEEN (ref 0–?)
pH: 6 (ref 5.0–8.0)

## 2023-08-08 LAB — VITAMIN D 25 HYDROXY (VIT D DEFICIENCY, FRACTURES): VITD: 91.56 ng/mL (ref 30.00–100.00)

## 2023-08-08 LAB — FOLATE: Folate: 11.6 ng/mL (ref >5.9)

## 2023-08-09 ENCOUNTER — Encounter: Payer: Self-pay | Admitting: Internal Medicine

## 2023-08-10 ENCOUNTER — Other Ambulatory Visit: Payer: Self-pay | Admitting: Internal Medicine

## 2023-08-10 DIAGNOSIS — I10 Essential (primary) hypertension: Secondary | ICD-10-CM

## 2023-08-21 ENCOUNTER — Ambulatory Visit (INDEPENDENT_AMBULATORY_CARE_PROVIDER_SITE_OTHER): Admitting: Podiatry

## 2023-08-21 ENCOUNTER — Other Ambulatory Visit: Payer: Self-pay | Admitting: Internal Medicine

## 2023-08-21 DIAGNOSIS — M79674 Pain in right toe(s): Secondary | ICD-10-CM | POA: Diagnosis not present

## 2023-08-21 DIAGNOSIS — B351 Tinea unguium: Secondary | ICD-10-CM | POA: Diagnosis not present

## 2023-08-21 DIAGNOSIS — M79675 Pain in left toe(s): Secondary | ICD-10-CM

## 2023-08-21 DIAGNOSIS — I10 Essential (primary) hypertension: Secondary | ICD-10-CM

## 2023-08-21 DIAGNOSIS — E876 Hypokalemia: Secondary | ICD-10-CM

## 2023-08-21 NOTE — Progress Notes (Signed)
  Subjective:  Patient ID: April May, female    DOB: 05/17/1955,  MRN: [id number]  Chief Complaint  Patient presents with   Diabetes    Pt stated that she is here to get her nails trimmed    68 y.o. female returns for the above complaint.  Patient presents with thickened onychodystrophy mycotic toenails x 10 mild pain on palpation arch with ambulation worse with pressure she would like me debride she is ambulatory.  Denies any other acute complaints.  Objective:  There were no vitals filed for this visit. Podiatric Exam: Vascular: dorsalis pedis and posterior tibial pulses are palpable bilateral. Capillary return is immediate. Temperature gradient is WNL. Skin turgor WNL  Sensorium: Normal Semmes Weinstein monofilament test. Normal tactile sensation bilaterally. Nail Exam: Pt has thick disfigured discolored nails with subungual debris noted bilateral entire nail hallux through fifth toenails.  Pain on palpation to the nails. Ulcer Exam: There is no evidence of ulcer or pre-ulcerative changes or infection. Orthopedic Exam: Muscle tone and strength are WNL. No limitations in general ROM. No crepitus or effusions noted.  Skin: No Porokeratosis. No infection or ulcers    Assessment & Plan:  No diagnosis found.  Patient was evaluated and treated and all questions answered.  Onychomycosis with pain  -Nails palliatively debrided as below. -Educated on self-care  Procedure: Nail Debridement Rationale: pain  Type of Debridement: manual, sharp debridement. Instrumentation: Nail nipper, rotary burr. Number of Nails: 10  Procedures and Treatment: Consent by patient was obtained for treatment procedures. The patient understood the discussion of treatment and procedures well. All questions were answered thoroughly reviewed. Debridement of mycotic and hypertrophic toenails, 1 through 5 bilateral and clearing of subungual debris. No ulceration, no infection noted.  Return  Visit-Office Procedure: Patient instructed to return to the office for a follow up visit 3 months for continued evaluation and treatment.  Nicholes Rough, DPM    No follow-ups on file.

## 2023-08-22 DIAGNOSIS — G4733 Obstructive sleep apnea (adult) (pediatric): Secondary | ICD-10-CM | POA: Diagnosis not present

## 2023-08-31 ENCOUNTER — Other Ambulatory Visit: Payer: Self-pay | Admitting: Internal Medicine

## 2023-08-31 DIAGNOSIS — I10 Essential (primary) hypertension: Secondary | ICD-10-CM

## 2023-08-31 DIAGNOSIS — F321 Major depressive disorder, single episode, moderate: Secondary | ICD-10-CM

## 2023-08-31 DIAGNOSIS — E876 Hypokalemia: Secondary | ICD-10-CM

## 2023-09-05 ENCOUNTER — Encounter: Payer: Self-pay | Admitting: Internal Medicine

## 2023-09-07 ENCOUNTER — Other Ambulatory Visit: Payer: Self-pay | Admitting: Cardiology

## 2023-09-09 ENCOUNTER — Other Ambulatory Visit: Payer: Self-pay

## 2023-09-09 DIAGNOSIS — J302 Other seasonal allergic rhinitis: Secondary | ICD-10-CM

## 2023-09-09 MED ORDER — FLUTICASONE PROPIONATE 50 MCG/ACT NA SUSP: 2.0000 | Freq: Every day | NASAL | 3 refills | Status: AC

## 2023-09-10 ENCOUNTER — Encounter: Payer: Self-pay | Admitting: Internal Medicine

## 2023-09-10 ENCOUNTER — Other Ambulatory Visit (INDEPENDENT_AMBULATORY_CARE_PROVIDER_SITE_OTHER)

## 2023-09-10 ENCOUNTER — Ambulatory Visit (INDEPENDENT_AMBULATORY_CARE_PROVIDER_SITE_OTHER): Payer: 59 | Admitting: Internal Medicine

## 2023-09-10 VITALS — BP 118/78 | HR 70 | Ht 60.0 in | Wt 218.4 lb

## 2023-09-10 DIAGNOSIS — K50919 Crohn's disease, unspecified, with unspecified complications: Secondary | ICD-10-CM

## 2023-09-10 DIAGNOSIS — K50918 Crohn's disease, unspecified, with other complication: Secondary | ICD-10-CM

## 2023-09-10 LAB — C-REACTIVE PROTEIN: CRP: <1 mg/dL (ref 0.5–20.0)

## 2023-09-10 NOTE — Patient Instructions (Addendum)
 Your provider has requested that you go to the basement level for lab work before leaving today. Press "B" on the elevator. The lab is located at the first door on the left as you exit the elevator.  You have been scheduled for a CT scan of the abdomen and pelvis at [facility], 1st floor Radiology. You are scheduled on 09/18/23 at 3:30 pm. You should arrive 1 hour prior (2:30 pm) to your appointment time for registration.    You may take any medications as prescribed with a small amount of water, if necessary. If you take any of the following medications: METFORMIN, GLUCOPHAGE, GLUCOVANCE, AVANDAMET, RIOMET, FORTAMET, ACTOPLUS MET, JANUMET, GLUMETZA or METAGLIP, you MAY be asked to HOLD this medication 48 hours AFTER the exam.   The purpose of you drinking the oral contrast is to aid in the visualization of your intestinal tract. The contrast solution may cause some diarrhea. Depending on your individual set of symptoms, you may also receive an intravenous injection of x-ray contrast/dye. Plan on being at [facility] for 45 minutes or longer, depending on the type of exam you are having performed.   If you have any questions regarding your exam or if you need to reschedule, you may call [facility] at [phone] between the hours of 8:00 am and 5:00 pm, Monday-Friday.   _______________________________________________________  If your blood pressure at your visit was 140/90 or greater, please contact your primary care physician to follow up on this.  _______________________________________________________  If you are age 50 or older, your body mass index should be between 23-30. Your Body mass index is 42.65 kg/m. If this is out of the aforementioned range listed, please consider follow up with your Primary Care Provider.  If you are age 56 or younger, your body mass index should be between 19-25. Your Body mass index is 42.65 kg/m. If this is out of the aformentioned range  listed, please consider follow up with your Primary Care Provider.   ________________________________________________________  The [facility] providers would like to encourage you to use MYCHART to communicate with providers for non-urgent requests or questions.  Due to long hold times on the telephone, sending your provider a message by [facility] may be a faster and more efficient way to get a response.  Please allow 48 business hours for a response.  Please remember that this is for non-urgent requests.  _______________________________________________________  Due to recent changes in healthcare laws, you may see the results of your imaging and laboratory studies on MyChart before your provider has had a chance to review them.  We understand that in some cases there may be results that are confusing or concerning to you. Not all laboratory results come back in the same time frame and the provider may be waiting for multiple results in order to interpret others.  Please give us  48 hours in order for your provider to thoroughly review all the results before contacting the office for clarification of your results.   Thank you for entrusting me with your care and for choosing [facility], Dr. Regino Caprio

## 2023-09-10 NOTE — Progress Notes (Signed)
 Assessment     Crohn's disease with ileitis and perianal disease. S/P TI and right colon resection in 2016 PVD on Plavix  Patient presents for follow-up of Crohn's disease.  Her last colonoscopy in 07/2022 showed some signs of neoterminal ileal erosions with biopsies suggesting active chronic inflammation due to Crohn's disease.  This could suggest that she may benefit from a therapy change.  Patient has been on budesonide  since 2019 and per note documentation patient has refused to change therapies through this course of time.  Will try to get some baseline inflammatory markers and obtain a CT enterography to get a better idea of her disease status.  I did discuss the idea of biologic therapy with the patient today, but she was still hesitant to change therapies at this time.  Patient does have concerns about skin cancer but I think that a medication such as Skyrizi or Entyvio may be reasonable options for the patient due to their good side effect profiles.   Recommendations    - Check CRP, quantiferon gold - Check fecal calprotectin - Check CT enterography - Continue budesonide  9 mg every day - Next colonoscopy due in Mar 2027 - RTC in 3 months   HPI    This is a 68 year old female with history of Crohn's disease and azathioprine induced pancreatitis presents for follow-up of ileocolonic Crohn's disease. She was diagnosed with Crohn's disease at age 63 and had a small intestine surgery at that time.  She then had a right hemicolectomy in 11/2017. She remains on budesonide  and has been on budesonide  9 mg daily since 09/2017. She was noted to have a perianal fistula starting in 03/2022 and had excision of a fistula tract in 05/2022 by Dr. Hershell May. Per documented notes, patient has not wanted to switch from budesonide  therapy  Colonoscopy 11/2009 by Dr. Nickey May showed ICV scarring and hemorrhoids.   Colonoscopy 09/25/17: - A few erosions in the neo- terminal ileum. Biopsied. - Patent end- to- side  ileo- colonic anastomosis, characterized by erosion. - The examination was otherwise normal on direct and retroflexion views.  Colonoscopy Mar 2024 - Patent end-to-side ileo-colonic anastomosis, characterized by neoterminal ileum erosions. Biopsied.  - Three 6 to 7 mm polyps in the sigmoid colon and in the transverse colon, removed with a cold snare. Resected and retrieved.  - Internal hemorrhoids.  - The examination was otherwise normal on direct and retroflexion views. Biopsied. Surgical [P], small bowel, terminal ileum - ACTIVE CHRONIC ILEITIS WITH EROSIONS, PSEUDOPYLORIC METAPLASIA AND REACTIVE CHANGES CONSISTENT WITH THE PATIENT'S CLINICAL HISTORY OF CROHN'S DISEASE - NEGATIVE FOR GRANULOMATA, DYSPLASIA OR MALIGNANCY 2. Surgical [P], colon, transverse, polyp (2) - POLYPOID LOW-GRADE DYSPLASIA (1 FRAGMENT) - HYPERPLASTIC POLYP(S) (2 FRAGMENTS) - NEGATIVE FOR HIGH-GRADE DYSPLASIA OR MALIGNANCY - There is a single, small fragment of polypoid low-grade dysplasia present on sections examined. This may represent a small, sporadic tubular adenoma or polypoid low grade dysplasia in the setting of inflammatory bowel disease. Clinical correlation recommended. 3. Surgical [P], random colon bx - BENIGN COLONIC MUCOSA - NEGATIVE FOR SIGNIFICANT CHRONIC CHANGES, ACTIVE INFLAMMATION, GRANULOMATA, DYSPLASIA OR MALIGNANCY  Interval History: Her bowel habits have changed. She used to be able to go more regularly. She has 3-4 BMs per day. Denies nocturnal stools. She now feels like she doesn't clear her bowels out fully. Denies ab pain. Denies N&V. Denies blood in the stools. She has been on budesonide  9 mg every day. She has had a skin mole in the  past (turned out to be benign) and was not interested in biologic medications due to concerns about skin cancer. She does have joint pains but they have not been attributed to Crohn's disese.   Labs / Imaging       Latest Ref Rng & Units 10/07/2022    7:10  PM 03/27/2022   10:18 AM 06/21/2021   10:44 AM  Hepatic Function  Total Protein 6.5 - 8.1 g/dL 8.2  7.1  6.8   Albumin  3.5 - 5.0 g/dL 4.1  4.1  3.9   AST 15 - 41 U/L 23  17  14    ALT 0 - 44 U/L 19  21  18    Alk Phosphatase 38 - 126 U/L 66  48  44   Total Bilirubin 0.3 - 1.2 mg/dL 1.3  0.4  0.6   Bilirubin, Direct 0.0 - 0.2 mg/dL <6.0   0.1        Latest Ref Rng & Units 08/07/2023    3:17 PM 05/16/2023    4:00 PM 10/07/2022    7:10 PM  CBC  WBC 4.0 - 10.5 K/uL 11.9  12.4  16.4   Hemoglobin 12.0 - 15.0 g/dL 45.4  09.8  11.9   Hematocrit 36.0 - 46.0 % 44.9  44.8  54.4   Platelets 150.0 - 400.0 K/uL 312.0  292.0  308      VAS US  ABI WITH/WO TBI  LOWER EXTREMITY DOPPLER STUDY  Patient Name:  April May  Date of Exam:   03/13/2023 Medical Rec #: [id number]                 Accession #:    [id number] Date of Birth: Jun 25, 1955                 Patient Gender: F Patient Age:   45 years Exam Location:  [facility] Procedure:      VAS US  ABI WITH/WO TBI Referring Phys:  --------------------------------------------------------------------------------   Indications: Claudication, and peripheral artery disease. post-intervention              follow up              April call. Patient complaining that her right great toe feels cold              and right knee is giving out.  High Risk Factors: Hypertension.   Vascular Interventions: 03/20/22 abdominal aortogram with RLE runoff, right                         anterior tibial artery angioplasty, right tibioperoneal                         trunk angioplasty, right peroneal artery angioplasty.  Performing Technologist: April May RVS, RCS    Examination Guidelines: A complete evaluation includes at minimum, Doppler waveform signals and systolic blood pressure reading at the level of bilateral brachial, anterior tibial, and posterior tibial arteries, when vessel segments are accessible. Bilateral testing  is considered an integral part of a complete examination. Photoelectric Plethysmograph (PPG) waveforms and toe systolic pressure readings are included as required and additional duplex testing as needed. Limited examinations for reoccurring indications may be performed as noted.    ABI Findings: +-----+------------------+-----+----------+--------+ RightRt Pressure (mmHg)IndexWaveform  Comment  +-----+------------------+-----+----------+--------+ PTA  97                0.61 biphasic           +-----+------------------+-----+----------+--------+  DP   105               0.66 monophasic         +-----+------------------+-----+----------+--------+  +---------+------------------+-----+----------+-------+ Left     Lt Pressure (mmHg)IndexWaveform  Comment +---------+------------------+-----+----------+-------+ Brachial 160                                      +---------+------------------+-----+----------+-------+ PTA      165               1.03 triphasic         +---------+------------------+-----+----------+-------+ DP       179               1.12 monophasic        +---------+------------------+-----+----------+-------+ Great Toe96                0.60                   +---------+------------------+-----+----------+-------+  +-------+-----------+-----------+------------+------------+ ABI/TBIToday's ABIToday's TBIPrevious ABIPrevious TBI +-------+-----------+-----------+------------+------------+ Right  0.66       absent     0.83        absent       +-------+-----------+-----------+------------+------------+ Left   1.12       0.60       1.12        0.32         +-------+-----------+-----------+------------+------------+     Right ABIs appear decreased. Left ABIs appear essentially unchanged compared to prior study on 04/23/2022.   Summary: Right: Resting right ankle-brachial index indicates moderate right lower  extremity arterial disease.  Left: Resting left ankle-brachial index is within normal range. The left toe-brachial index is abnormal.  *See table(s) above for measurements and observations.    Electronically signed by Angela Kell MD on 03/13/2023 at 3:40:02 PM.      Final    Current Medications, Allergies, Past Medical History, Past Surgical History, Family History and Social History were reviewed in [employer] record.   Physical Exam: General: Well developed, well nourished, no acute distress Head: Normocephalic and atraumatic Eyes: Sclerae anicteric, EOMI Ears: Normal auditory acuity Mouth: No deformities or lesions noted Lungs: Clear throughout to auscultation Heart: Regular rate and rhythm; No murmurs, rubs or bruits Abdomen: Soft, non tender and non distended. No masses, hepatosplenomegaly or hernias noted. Normal Bowel sounds Rectal: Not done Musculoskeletal: Symmetrical with no gross deformities  Pulses:  Normal pulses noted Extremities: No edema or deformities noted Neurological: Alert oriented x 4, grossly nonfocal Psychological:  Alert and cooperative. Normal mood and affect   April Drum, MD 09/10/2023, 3:39 PM   I spent 44 minutes of time, including in depth chart review, independent review of results as outlined above, communicating results with the patient directly, face-to-face time with the patient, coordinating care, ordering studies and medications as appropriate, and documentation.

## 2023-09-12 ENCOUNTER — Encounter: Payer: Self-pay | Admitting: Internal Medicine

## 2023-09-12 LAB — QUANTIFERON-TB GOLD PLUS
Mitogen-NIL: 8.43 [IU]/mL
NIL: 0.01 [IU]/mL
QuantiFERON-TB Gold Plus: NEGATIVE
TB1-NIL: 0 [IU]/mL
TB2-NIL: 0 [IU]/mL

## 2023-09-16 ENCOUNTER — Encounter: Payer: Self-pay | Admitting: Internal Medicine

## 2023-09-16 ENCOUNTER — Other Ambulatory Visit

## 2023-09-16 ENCOUNTER — Ambulatory Visit (INDEPENDENT_AMBULATORY_CARE_PROVIDER_SITE_OTHER): Admitting: Internal Medicine

## 2023-09-16 ENCOUNTER — Other Ambulatory Visit: Payer: Self-pay

## 2023-09-16 VITALS — BP 140/84 | HR 77 | Temp 98.3°F | Ht 60.0 in | Wt 217.0 lb

## 2023-09-16 DIAGNOSIS — I10 Essential (primary) hypertension: Secondary | ICD-10-CM | POA: Diagnosis not present

## 2023-09-16 DIAGNOSIS — J0100 Acute maxillary sinusitis, unspecified: Secondary | ICD-10-CM | POA: Diagnosis not present

## 2023-09-16 DIAGNOSIS — K50919 Crohn's disease, unspecified, with unspecified complications: Secondary | ICD-10-CM | POA: Diagnosis not present

## 2023-09-16 DIAGNOSIS — H6123 Impacted cerumen, bilateral: Secondary | ICD-10-CM

## 2023-09-16 MED ORDER — AMOXICILLIN-POT CLAVULANATE 875-125 MG PO TABS: 1.0000 | ORAL_TABLET | Freq: Two times a day (BID) | ORAL | 0 refills | Status: DC

## 2023-09-16 NOTE — Patient Instructions (Signed)

## 2023-09-16 NOTE — Progress Notes (Unsigned)
 Subjective:  Patient ID: April May, female    DOB: [birth year]  Age: 68 y.o. MRN: [id number]  CC: Sinusitis   HPI April May presents for fu/p ----  Discussed the use of AI scribe software for clinical note transcription with the patient, who gave verbal consent to proceed.  History of Present Illness   April May is a 68 year old female who presents with loss of taste and smell following a recent upper respiratory infection.  Her symptoms began approximately 10 days ago with severe sneezing and loose mucus drainage from her nose. She experienced a loss of taste and smell, which persists. No fever, chills, facial pain, or sinus pain. Her symptoms are not as severe as a previous illness two months ago, during which she was tested for COVID-19 and RSV, both of which were negative.  During the previous illness, she was prescribed antibiotics after a week of persistent symptoms, which led to improvement in taste and smell within two days. Currently, she continues to experience a significant amount of nasal mucus, described as 'a bunch of mucus' when blowing her nose. No blood in the mucus.  No known allergies to antibiotics. No current cough, aches, or pains. She is concerned about whether her symptoms could be related to COVID-19, given her loss of taste and smell.       Outpatient Medications Prior to Visit  Medication Sig Dispense Refill   albuterol  (PROAIR  HFA) 108 (90 Base) MCG/ACT inhaler TAKE 2 PUFFS BY MOUTH EVERY 6 HOURS AS NEEDED FOR WHEEZE OR SHORTNESS OF BREATH (Patient taking differently: Inhale 2 puffs into the lungs every 6 (six) hours as needed for shortness of breath or wheezing. TAKE 2 PUFFS BY MOUTH EVERY 6 HOURS AS NEEDED FOR WHEEZE OR SHORTNESS OF BREATH) 18 g 5   amLODipine  (NORVASC ) 10 MG tablet TAKE 1 TABLET BY MOUTH DAILY 100 tablet 0   budesonide  (ENTOCORT EC ) 3 MG 24 hr capsule Take 3 capsules (9 mg total) by mouth daily. 270  capsule 3   buPROPion  (WELLBUTRIN  XL) 150 MG 24 hr tablet TAKE 1 TABLET BY MOUTH DAILY 100 tablet 0   Cholecalciferol  (VITAMIN D3) 1.25 MG (50000 UT) capsule TAKE 1 CAPSULE BY MOUTH ONE TIME PER WEEK 12 capsule 0   clindamycin (CLEOCIN T) 1 % lotion      clonazePAM  (KLONOPIN ) 0.5 MG tablet Take 1 tablet (0.5 mg total) by mouth 2 (two) times daily as needed for anxiety. 60 tablet 3   clopidogrel  (PLAVIX ) 75 MG tablet TAKE 1 TABLET BY MOUTH EVERY DAY 90 tablet 3   [employer] PAIN RELIEF EXTRA STRENGTH 500 MG tablet TAKE 1 TABLET BY MOUTH EVERY 8 HOURS AS NEEDED FOR MODERATE PAIN (Patient taking differently: Take 500-1,000 mg by mouth every 8 (eight) hours as needed for moderate pain (pain score 4-6).) 60 tablet 5   Cyanocobalamin  (B-12 COMPLIANCE INJECTION) 1000 MCG/ML KIT Inject as directed every 30 (thirty) days.     ezetimibe  (ZETIA ) 10 MG tablet TAKE 1 TABLET BY MOUTH DAILY 90 tablet 0   fluticasone  (FLONASE ) 50 MCG/ACT nasal spray Place 2 sprays into both nostrils daily. 48 g 3   lamoTRIgine  (LAMICTAL ) 200 MG tablet TAKE 1 TABLET BY MOUTH AT  BEDTIME 100 tablet 0   Potassium Citrate  15 MEQ (1620 MG) TBCR Take 1 tablet by mouth in the morning, at noon, and at bedtime. Schedule an appt for further refills 90 tablet 0   spironolactone  (ALDACTONE ) 100 MG tablet  Take 1 tablet (100 mg total) by mouth daily. 90 tablet 1   STIOLTO RESPIMAT  2.5-2.5 MCG/ACT AERS USE 2 INHALATIONS BY MOUTH  DAILY (Patient taking differently: as needed.) 12 g 1   triamcinolone  cream (KENALOG) 0.1 % Apply 1 Application topically as needed.     azithromycin  (ZITHROMAX ) 250 MG tablet Take by mouth.     No facility-administered medications prior to visit.    ROS Review of Systems  Objective:  BP (!) 140/84 (BP Location: Left Arm, Patient Position: Sitting, Cuff Size: Large)   Pulse 77   Temp 98.3 F (36.8 C) (Oral)   Ht 5' (1.524 m)   Wt 217 lb (98.4 kg)   LMP 05/14/1988   SpO2 98%   BMI 42.38 kg/m   BP Readings from  Last 3 Encounters:  09/16/23 (!) 140/84  09/10/23 118/78  05/28/23 130/80    Wt Readings from Last 3 Encounters:  09/16/23 217 lb (98.4 kg)  09/10/23 218 lb 6 oz (99.1 kg)  05/28/23 224 lb (101.6 kg)    Physical Exam  Lab Results  Component Value Date   WBC 11.9 (H) 08/07/2023   HGB 14.6 08/07/2023   HCT 44.9 08/07/2023   PLT 312.0 08/07/2023   GLUCOSE 95 08/07/2023   CHOL 123 10/24/2022   TRIG 100.0 10/24/2022   HDL 55.00 10/24/2022   LDLCALC 48 10/24/2022   ALT 19 10/07/2022   AST 23 10/07/2022   NA 139 08/07/2023   K 3.8 08/07/2023   CL 104 08/07/2023   CREATININE 0.85 08/07/2023   BUN 9 08/07/2023   CO2 28 08/07/2023   TSH 2.97 10/24/2022   HGBA1C 5.9 08/07/2023   MICROALBUR 1.1 08/07/2023    VAS US  ABI WITH/WO TBI Result Date: 03/13/2023  LOWER EXTREMITY DOPPLER STUDY Patient Name:  April May  Date of Exam:   03/13/2023 Medical Rec #: [id number]                 Accession #:    [id number] Date of Birth: 1955-05-18                 Patient Gender: F Patient Age:   29 years Exam Location:  [facility] Procedure:      VAS US  ABI WITH/WO TBI Referring Phys: --------------------------------------------------------------------------------  Indications: Claudication, and peripheral artery disease. post-intervention              follow up              April May. Patient complaining that her right great toe feels cold              and right knee is giving out. High Risk Factors: Hypertension.  Vascular Interventions: 03/20/22 abdominal aortogram with RLE runoff, right                         anterior tibial artery angioplasty, right tibioperoneal                         trunk angioplasty, right peroneal artery angioplasty. Performing Technologist: Parke Boll RVS, RCS  Examination Guidelines: A complete evaluation includes at minimum, Doppler waveform signals and systolic blood pressure reading at the level of bilateral brachial, anterior tibial,  and posterior tibial arteries, when vessel segments are accessible. Bilateral testing is considered an integral part of a complete examination. Photoelectric Plethysmograph (PPG) waveforms and toe systolic pressure readings are included as  required and additional duplex testing as needed. Limited examinations for reoccurring indications may be performed as noted.  ABI Findings: +-----+------------------+-----+----------+--------+ RightRt Pressure (mmHg)IndexWaveform  Comment  +-----+------------------+-----+----------+--------+ PTA  97                0.61 biphasic           +-----+------------------+-----+----------+--------+ DP   105               0.66 monophasic         +-----+------------------+-----+----------+--------+ +---------+------------------+-----+----------+-------+ Left     Lt Pressure (mmHg)IndexWaveform  Comment +---------+------------------+-----+----------+-------+ Brachial 160                                      +---------+------------------+-----+----------+-------+ PTA      165               1.03 triphasic         +---------+------------------+-----+----------+-------+ DP       179               1.12 monophasic        +---------+------------------+-----+----------+-------+ Great Toe96                0.60                   +---------+------------------+-----+----------+-------+ +-------+-----------+-----------+------------+------------+ ABI/TBIToday's ABIToday's TBIPrevious ABIPrevious TBI +-------+-----------+-----------+------------+------------+ Right  0.66       absent     0.83        absent       +-------+-----------+-----------+------------+------------+ Left   1.12       0.60       1.12        0.32         +-------+-----------+-----------+------------+------------+  Right ABIs appear decreased. Left ABIs appear essentially unchanged compared to prior study on 04/23/2022.  Summary: Right: Resting right ankle-brachial index  indicates moderate right lower extremity arterial disease. Left: Resting left ankle-brachial index is within normal range. The left toe-brachial index is abnormal. *See table(s) above for measurements and observations.  Electronically signed by Angela Kell MD on 03/13/2023 at 3:40:02 PM.    Final     Assessment & Plan:  There are no diagnoses linked to this encounter.   Follow-up: No follow-ups on file.  Sandra Crouch, MD

## 2023-09-17 DIAGNOSIS — H6123 Impacted cerumen, bilateral: Secondary | ICD-10-CM | POA: Insufficient documentation

## 2023-09-18 ENCOUNTER — Encounter: Payer: Self-pay | Admitting: *Deleted

## 2023-09-18 ENCOUNTER — Telehealth: Payer: Self-pay | Admitting: Internal Medicine

## 2023-09-18 ENCOUNTER — Other Ambulatory Visit: Payer: Self-pay | Admitting: Internal Medicine

## 2023-09-18 ENCOUNTER — Ambulatory Visit (HOSPITAL_COMMUNITY)
Admission: RE | Admit: 2023-09-18 | Discharge: 2023-09-18 | Disposition: A | Discharge status: Home / Self Care | Source: Ambulatory Visit | Attending: Internal Medicine | Admitting: Internal Medicine

## 2023-09-18 ENCOUNTER — Encounter (HOSPITAL_COMMUNITY): Payer: Self-pay

## 2023-09-18 DIAGNOSIS — B3731 Acute candidiasis of vulva and vagina: Secondary | ICD-10-CM | POA: Insufficient documentation

## 2023-09-18 DIAGNOSIS — K50919 Crohn's disease, unspecified, with unspecified complications: Secondary | ICD-10-CM

## 2023-09-18 LAB — CALPROTECTIN, FECAL: Calprotectin, Fecal: 16 ug/g (ref 0–120)

## 2023-09-18 MED ORDER — FLUCONAZOLE 150 MG PO TABS: 150.0000 mg | ORAL_TABLET | Freq: Once | ORAL | 3 refills | Status: AC

## 2023-09-18 NOTE — Telephone Encounter (Signed)
 Patient was scheduled for a CT scan at 3 today.  She arrived at 2 as she was told to do; however, radiology told her they were 3 hours behind, so she went to get something to eat only to find out later that she was not supposed to have had anything.  Anyway, she is having to reschedule and needs another order put in.  She does not want to do it again until June.  Please call patient and advise.  Thank you.,

## 2023-09-19 ENCOUNTER — Encounter: Payer: Self-pay | Admitting: Internal Medicine

## 2023-09-19 NOTE — Telephone Encounter (Signed)
 Secure staff message sent to radiology scheduling to contact patient to reschedule appt.

## 2023-09-20 ENCOUNTER — Encounter (HOSPITAL_COMMUNITY): Payer: Self-pay

## 2023-09-20 ENCOUNTER — Other Ambulatory Visit: Payer: Self-pay | Admitting: Internal Medicine

## 2023-09-20 DIAGNOSIS — B3731 Acute candidiasis of vulva and vagina: Secondary | ICD-10-CM

## 2023-09-20 MED ORDER — FLUCONAZOLE 150 MG PO TABS: 150.0000 mg | ORAL_TABLET | Freq: Once | ORAL | 3 refills | Status: AC

## 2023-09-21 DIAGNOSIS — G4733 Obstructive sleep apnea (adult) (pediatric): Secondary | ICD-10-CM | POA: Diagnosis not present

## 2023-09-22 ENCOUNTER — Encounter: Payer: Self-pay | Admitting: Internal Medicine

## 2023-09-23 NOTE — Telephone Encounter (Signed)
 CT scan has been rescheduled to 10/16/23 at 4:30 pm

## 2023-09-24 ENCOUNTER — Other Ambulatory Visit: Payer: Self-pay

## 2023-09-27 ENCOUNTER — Ambulatory Visit

## 2023-09-27 VITALS — Ht 60.0 in | Wt 217.0 lb

## 2023-09-27 DIAGNOSIS — F17200 Nicotine dependence, unspecified, uncomplicated: Secondary | ICD-10-CM | POA: Diagnosis not present

## 2023-09-27 DIAGNOSIS — Z122 Encounter for screening for malignant neoplasm of respiratory organs: Secondary | ICD-10-CM | POA: Diagnosis not present

## 2023-09-27 DIAGNOSIS — Z0000 Encounter for general adult medical examination without abnormal findings: Secondary | ICD-10-CM | POA: Diagnosis not present

## 2023-09-27 NOTE — Progress Notes (Addendum)
 Subjective:  Please attest and cosign this visit due to patients primary care provider not being in the office at the time the visit was completed.  (Pt of Dr Sandra Crouch)   April May is a 68 y.o. who presents for a Medicare Wellness preventive visit.  As a reminder, Annual Wellness Visits don't include a physical exam, and some assessments may be limited, especially if this visit is performed virtually. We may recommend an in-person follow-up visit with your provider if needed.  Visit Complete: Virtual I connected with  April May on 09/27/23 by a video and audio enabled telemedicine application and verified that I am speaking with the correct person using two identifiers.  Patient Location: Home  Provider Location: Office/Clinic  I discussed the limitations of evaluation and management by telemedicine. The patient expressed understanding and agreed to proceed.  Vital Signs: Because this visit was a virtual/telehealth visit, some criteria may be missing or patient reported. Any vitals not documented were not able to be obtained and vitals that have been documented are patient reported.  Persons Participating in Visit: Patient.  AWV Questionnaire: No: Patient Medicare AWV questionnaire was not completed prior to this visit.  Cardiac Risk Factors include: advanced age (>55men, >50 women);dyslipidemia;hypertension;smoking/ tobacco exposure;obesity (BMI >30kg/m2)     Objective:     Today's Vitals   09/27/23 1531  Weight: 217 lb (98.4 kg)  Height: 5' (1.524 m)   Body mass index is 42.38 kg/m.     09/27/2023    3:30 PM 11/05/2022    9:26 AM 10/07/2022    7:08 PM 05/18/2022    6:16 AM 03/20/2022   10:13 AM 11/24/2021   10:41 AM 11/22/2020    9:52 AM  Advanced Directives  Does Patient Have a Medical Advance Directive? Yes Yes No No No No Yes  Type of Estate agent of [place];Living will        Does patient want to make changes to  medical advance directive? No - Patient declined      No - Patient declined  Copy of Healthcare Power of Attorney in Chart? Yes - validated most recent copy scanned in chart (See row information)        Would patient like information on creating a medical advance directive?   No - Patient declined No - Patient declined No - Patient declined No - Patient declined     Current Medications (verified) Outpatient Encounter Medications as of 09/27/2023  Medication Sig   albuterol  (PROAIR  HFA) 108 (90 Base) MCG/ACT inhaler TAKE 2 PUFFS BY MOUTH EVERY 6 HOURS AS NEEDED FOR WHEEZE OR SHORTNESS OF BREATH (Patient taking differently: Inhale 2 puffs into the lungs every 6 (six) hours as needed for shortness of breath or wheezing. TAKE 2 PUFFS BY MOUTH EVERY 6 HOURS AS NEEDED FOR WHEEZE OR SHORTNESS OF BREATH)   amLODipine  (NORVASC ) 10 MG tablet TAKE 1 TABLET BY MOUTH DAILY   budesonide  (ENTOCORT EC ) 3 MG 24 hr capsule Take 3 capsules (9 mg total) by mouth daily.   buPROPion  (WELLBUTRIN  XL) 150 MG 24 hr tablet TAKE 1 TABLET BY MOUTH DAILY   Cholecalciferol  (VITAMIN D3) 1.25 MG (50000 UT) capsule TAKE 1 CAPSULE BY MOUTH ONE TIME PER WEEK   clindamycin (CLEOCIN T) 1 % lotion    clonazePAM  (KLONOPIN ) 0.5 MG tablet Take 1 tablet (0.5 mg total) by mouth 2 (two) times daily as needed for anxiety.   clopidogrel  (PLAVIX ) 75 MG tablet TAKE 1  TABLET BY MOUTH EVERY DAY   [employer] PAIN RELIEF EXTRA STRENGTH 500 MG tablet TAKE 1 TABLET BY MOUTH EVERY 8 HOURS AS NEEDED FOR MODERATE PAIN (Patient taking differently: Take 500-1,000 mg by mouth every 8 (eight) hours as needed for moderate pain (pain score 4-6).)   Cyanocobalamin  (B-12 COMPLIANCE INJECTION) 1000 MCG/ML KIT Inject as directed every 30 (thirty) days.   ezetimibe  (ZETIA ) 10 MG tablet TAKE 1 TABLET BY MOUTH DAILY   fluticasone  (FLONASE ) 50 MCG/ACT nasal spray Place 2 sprays into both nostrils daily.   lamoTRIgine  (LAMICTAL ) 200 MG tablet TAKE 1 TABLET BY MOUTH AT   BEDTIME   Potassium Citrate  15 MEQ (1620 MG) TBCR Take 1 tablet by mouth in the morning, at noon, and at bedtime. Schedule an appt for further refills   spironolactone  (ALDACTONE ) 100 MG tablet Take 1 tablet (100 mg total) by mouth daily.   STIOLTO RESPIMAT  2.5-2.5 MCG/ACT AERS USE 2 INHALATIONS BY MOUTH  DAILY (Patient taking differently: as needed.)   triamcinolone  cream (KENALOG) 0.1 % Apply 1 Application topically as needed.   [DISCONTINUED] amoxicillin -clavulanate (AUGMENTIN ) 875-125 MG tablet Take 1 tablet by mouth 2 (two) times daily.   No facility-administered encounter medications on file as of 09/27/2023.    Allergies (verified) Azathioprine, Crestor  [rosuvastatin  calcium ], Lipitor [atorvastatin  calcium ], Semaglutide , Nsaids, and Lisinopril    History: Past Medical History:  Diagnosis Date   Abrasion 05/10/2022   right foot   Anal fistula    Anticoagulated    plavix  and asa--- managed by vascular, dr Charlotte Cookey   Arthritis    B12 deficiency    CKD (chronic kidney disease), stage I    [facility] --- samantha collins PA (lov in epic/ media tab 02-23-2022)   COPD (chronic obstructive pulmonary disease) (HCC)    Coronary artery calcification seen on CAT scan    cardiologist--- dr Renna Cary;    nuclear stress test 10-29-2017  normal perfusion , nuclear ef 60%;   echo 10-16-2017  ef 55-60%, mild TR   Crohn's disease of both small and large intestine (HCC)    followed by dr stark  (GI   Full dentures    GAD (generalized anxiety disorder)    History of acute pancreatitis    secondary to azathioprine   History of small bowel obstruction 11/24/2014   admission in epic  for crohn's flare w/ sbo and abscess s/p  surgical intervention   History of squamous cell carcinoma of skin 06/11/2017   excision left arm   Hyperlipidemia, mixed    followed by cardiology and pcp;   statin intolerate   MDD (major depressive disorder)    OSA on CPAP    followed by dr Lendia Quay ;  (05-16-2022  per pt  uses nightly)   study in epic 01-05-2020  AHI 15.4/hr;  does not use cpap since weight loss 1 and 1/2 years ago   Perianal lesion    Peripheral vascular disease (HCC) 02/2022   vascular--- dr Charlotte Cookey;   03-20-2022  s/p  balloon angioplasty right occluded peroneal artery, right anterior tibial artery, right tibioperoneal truck   PONV (postoperative nausea and vomiting)    Pre-diabetes    Resistant hypertension    followed by pcp and cardiology   Swelling of right foot 05/10/2022   Vitamin D  deficiency    Past Surgical History:  Procedure Laterality Date   ABDOMINAL AORTOGRAM W/LOWER EXTREMITY N/A 03/20/2022   Procedure: ABDOMINAL AORTOGRAM W/LOWER EXTREMITY;  Surgeon: Margherita Shell, MD;  [facility]: [facility] INVASIVE  CV LAB;  Service: Cardiovascular;  Laterality: N/A;   ABDOMINAL HYSTERECTOMY     age 49;  per pt thinks ovaries were removed   APPENDECTOMY     teen   APPLICATION OF WOUND VAC  11/24/2014   Procedure: APPLICATION OF WOUND VAC;  Surgeon: Dareen Ebbing, MD;  Location: [facility] ORS;  Service: General;;   BOWEL RESECTION N/A 11/24/2014   Procedure: SMALL BOWEL RESECTION;  Surgeon: Dareen Ebbing, MD;  Location: [facility] ORS;  Service: General;  Laterality: N/A;   BREAST EXCISIONAL BIOPSY Right 1972   benign   LAPAROTOMY N/A 11/24/2014   Procedure: EXPLORATORY LAPAROTOMY EXTENSIVE LYSIS OF ADHESIONS SAMLL BOWEL RESECTION RIGHT HEMI COLECTOMY WOUND VAC APPLICATION. ;  Surgeon: Dareen Ebbing, MD;  Location: [facility] ORS;  Service: General;  Laterality: N/A;   LYSIS OF ADHESION  11/24/2014   Procedure: LYSIS OF ADHESION (3 HRS);  Surgeon: Dareen Ebbing, MD;  Location: [facility] ORS;  Service: General;;   PARTIAL COLECTOMY  11/24/2014   Procedure: RIGHT HEMI COLECTOMY;  Surgeon: Dareen Ebbing, MD;  Location: [facility] ORS;  Service: General;;   PERIPHERAL VASCULAR BALLOON ANGIOPLASTY  03/20/2022   Procedure: PERIPHERAL VASCULAR BALLOON ANGIOPLASTY;  Surgeon: Margherita Shell, MD;  Location: [facility] INVASIVE CV LAB;   Service: Cardiovascular;;  AT and Peroneal   SMALL INTESTINE SURGERY     age 11  (for crohn's disease)   TONSILLECTOMY     teen   Family History  Problem Relation Age of Onset   Hypertension Mother    Ulcerative colitis Mother    Crohn's disease Mother    Heart disease Mother    Kidney disease Mother    Arthritis Father    Brain cancer Father    Diabetes Father    Alcohol abuse Paternal Uncle    Diabetes Paternal Grandmother    Stroke Paternal Grandmother    Heart failure Sister    Kidney disease Sister    Heart failure Brother    Other Sister        prediabetes   Kidney cancer Brother    Social History   Socioeconomic History   Marital status: Divorced    Spouse name: Not on file   Number of children: 2   Years of education: Not on file   Highest education level: 12th grade  Occupational History   Occupation: disabled  Tobacco Use   Smoking status: Every Day    Current packs/day: 0.75    Average packs/day: 0.8 packs/day for 48.0 years (36.0 ttl pk-yrs)    Types: Cigarettes    Passive exposure: Current   Smokeless tobacco: Never   Tobacco comments:    05-16-2022  per pt started smoking as a teen and trying to quit  Vaping Use   Vaping status: Never Used  Substance and Sexual Activity   Alcohol use: No   Drug use: Never   Sexual activity: Not on file  Other Topics Concern   Not on file  Social History Narrative   Not on file   Social Drivers of Health   Financial Resource Strain: Low Risk  (09/27/2023)   Overall Financial Resource Strain (CARDIA)    Difficulty of Paying Living Expenses: Not hard at all  Food Insecurity: No Food Insecurity (09/27/2023)   Hunger Vital Sign    Worried About Running Out of Food in the Last Year: Never true    Ran Out of Food in the Last Year: Never true  Transportation Needs: No Transportation Needs (09/27/2023)  PRAPARE - Administrator, Civil Service (Medical): No    Lack of Transportation (Non-Medical): No   Physical Activity: Sufficiently Active (09/27/2023)   Exercise Vital Sign    Days of Exercise per Week: 5 days    Minutes of Exercise per Session: 30 min  Stress: No Stress Concern Present (09/27/2023)   [employer] of Occupational Health - Occupational Stress Questionnaire    Feeling of Stress : Not at all  Social Connections: Socially Isolated (09/27/2023)   Social Connection and Isolation Panel [NHANES]    Frequency of Communication with Friends and Family: Once a week    Frequency of Social Gatherings with Friends and Family: More than three times a week    Attends Religious Services: Never    Database administrator or Organizations: No    Attends Banker Meetings: Never    Marital Status: Divorced    Tobacco Counseling Ready to quit: Yes Counseling given: Yes Tobacco comments: 05-16-2022  per pt started smoking as a teen and trying to quit    Clinical Intake:  Pre-visit preparation completed: Yes  Pain : No/denies pain     BMI - recorded: 42.38 Nutritional Status: BMI > 30  Obese Nutritional Risks: None Diabetes: No  Lab Results  Component Value Date   HGBA1C 5.9 08/07/2023   HGBA1C 6.3 05/16/2023   HGBA1C 5.8 10/24/2022     How often do you need to have someone help you when you read instructions, pamphlets, or other written materials from your doctor or pharmacy?: 1 - Never  Interpreter Needed?: No  Information entered by :: Kandy Orris, CMA   Activities of Daily Living     09/27/2023    3:36 PM 11/05/2022    9:14 AM  In your present state of health, do you have any difficulty performing the following activities:  Hearing? 0 0  Vision? 0 1  Difficulty concentrating or making decisions? 0 0  Walking or climbing stairs? 0 0  Dressing or bathing? 0 0  Doing errands, shopping? 0 0  Preparing Food and eating ? N N  Using the Toilet? N N  In the past six months, have you accidently leaked urine? N N  Do you have problems with loss  of bowel control? N N  Managing your Medications? N N  Managing your Finances? N N  Housekeeping or managing your Housekeeping? N N    Patient Care Team: Arcadio Knuckles, MD as PCP - General (Internal Medicine) Hugh Madura, MD as PCP - Cardiology (Cardiology) Jonathan Neighbor, [facility] (Inactive) as Pharmacist (Pharmacist) Debbra Fairy, MD as Attending Physician (Neurology) Candyce Champagne, MD as Consulting Physician (Colon and Rectal Surgery) Hugh Madura, MD as Consulting Physician (Cardiology) Asencion Blacksmith, MD (Inactive) as Consulting Physician (Gastroenterology) Margherita Shell, MD as Consulting Physician (Vascular Surgery) Pa, [facility]  Indicate any recent Medical Services you may have received from other than [facility] providers in the past year (date may be approximate).     Assessment:    This is a routine wellness examination for Yarnell.  Hearing/Vision screen Hearing Screening - Comments:: Denies hearing difficulties   Vision Screening - Comments:: Wears rx glasses - appt in 09/2023   Goals Addressed               This Visit's Progress     Patient Stated (pt-stated)        Patient stated plans on staying active and trying  to quit smoking.       Depression Screen     09/27/2023    3:38 PM 05/16/2023    3:36 PM 12/11/2022    3:16 PM 11/05/2022    9:24 AM 10/24/2022    9:53 AM 11/24/2021   10:39 AM 10/30/2021   10:30 AM  PHQ 2/9 Scores  PHQ - 2 Score 0 0 0 3 0 0 1  PHQ- 9 Score 0  2 6 0  6    Fall Risk     09/27/2023    3:37 PM 05/16/2023    3:36 PM 12/11/2022    3:16 PM 11/05/2022    9:27 AM 11/24/2021   10:37 AM  Fall Risk   Falls in the past year? 1 0 0 0 0  Number falls in past yr: 0 0 0 0 0  Comment 1      Injury with Fall? 0 0 0 0 0  Risk for fall due to :  No Fall Risks No Fall Risks No Fall Risks No Fall Risks  Follow up Falls evaluation completed;Falls prevention discussed Falls evaluation completed Falls evaluation  completed Falls evaluation completed Falls evaluation completed    MEDICARE RISK AT HOME:  Medicare Risk at Home Any stairs in or around the home?: No If so, are there any without handrails?: No Home free of loose throw rugs in walkways, pet beds, electrical cords, etc?: Yes Adequate lighting in your home to reduce risk of falls?: Yes Life alert?: No Use of a cane, walker or w/c?: No Grab bars in the bathroom?: Yes Shower chair or bench in shower?: Yes Elevated toilet seat or a handicapped toilet?: No  TIMED UP AND GO:  Was the test performed?  No  Cognitive Function: 6CIT completed        09/27/2023    3:42 PM 11/05/2022    9:27 AM 11/24/2021   10:53 AM  6CIT Screen  What Year? 0 points 0 points 0 points  What month? 0 points 0 points 0 points  What time? 0 points 0 points 0 points  Count back from 20 0 points 0 points 0 points  Months in reverse 0 points 4 points 0 points  Repeat phrase 2 points 8 points 0 points  Total Score 2 points 12 points 0 points    Immunizations Immunization History  Administered Date(s) Administered   Fluad Quad(high Dose 65+) 02/09/2022   Fluad Trivalent(High Dose 65+) 04/24/2023   Influenza Split 04/04/2011, 02/15/2012   Influenza Whole 04/30/2008, 04/01/2009   Influenza,inj,Quad PF,6+ Mos 02/24/2015, 03/21/2016, 01/31/2017, 01/08/2018, 01/08/2019, 01/22/2020, 01/30/2021   PFIZER Comirnaty(Gray Top)Covid-19 Tri-Sucrose Vaccine 12/30/2020   PFIZER(Purple Top)SARS-COV-2 Vaccination 07/18/2019, 08/08/2019, 03/04/2020   PPD Test 10/16/2013, 03/05/2018, 12/16/2020   Pneumococcal Conjugate-13 02/24/2015   Pneumococcal Polysaccharide-23 02/15/2012, 08/20/2017   Td 12/31/2008   Tdap 10/16/2013    Screening Tests Health Maintenance  Topic Date Due   Zoster Vaccines- Shingrix  (1 of 2) Never done   OPHTHALMOLOGY EXAM  09/23/2021   COVID-19 Vaccine (5 - 2024-25 season) 01/13/2023   Lung Cancer Screening  10/07/2023   Pneumonia Vaccine 41+  Years old (3 of 3 - PCV20 or PCV21) 10/24/2023 (Originally 08/21/2022)   DTaP/Tdap/Td (3 - Td or Tdap) 10/17/2023   INFLUENZA VACCINE  12/13/2023   HEMOGLOBIN A1C  02/07/2024   FOOT EXAM  04/28/2024   Diabetic kidney evaluation - eGFR measurement  08/06/2024   Diabetic kidney evaluation - Urine ACR  08/06/2024   Medicare Annual  Wellness (AWV)  09/26/2024   MAMMOGRAM  03/05/2025   Colonoscopy  07/24/2025   DEXA SCAN  Completed   Hepatitis C Screening  Completed   HPV VACCINES  Aged Out   Meningococcal B Vaccine  Aged Out    Health Maintenance  Health Maintenance Due  Topic Date Due   Zoster Vaccines- Shingrix  (1 of 2) Never done   OPHTHALMOLOGY EXAM  09/23/2021   COVID-19 Vaccine (5 - 2024-25 season) 01/13/2023   Lung Cancer Screening  10/07/2023   Health Maintenance Items Addressed: Lung Cancer Screening ordered  Additional Screening:  Vision Screening: Recommended annual ophthalmology exams for early detection of glaucoma and other disorders of the eye.  Dental Screening: Recommended annual dental exams for proper oral hygiene  Community Resource Referral / Chronic Care Management: CRR required this visit?  No   CCM required this visit?  No   Plan:    I have personally reviewed and noted the following in the patient's chart:   Medical and social history Use of alcohol, tobacco or illicit drugs  Current medications and supplements including opioid prescriptions. Patient is not currently taking opioid prescriptions. Functional ability and status Nutritional status Physical activity Advanced directives List of other physicians Hospitalizations, surgeries, and ER visits in previous 12 months Vitals Screenings to include cognitive, depression, and falls Referrals and appointments  In addition, I have reviewed and discussed with patient certain preventive protocols, quality metrics, and best practice recommendations. A written personalized care plan for preventive  services as well as general preventive health recommendations were provided to patient.   Patria Bookbinder, CMA   09/27/2023   After Visit Summary: Due to this being a Video visit, the patient will review information via MyChart.   Notes: Nothing significant to report at this time.

## 2023-09-27 NOTE — Patient Instructions (Signed)
 Ms. April May , Thank you for taking time out of your busy schedule to complete your Annual Wellness Visit with me. I enjoyed our conversation and look forward to speaking with you again next year. I, as well as your care team,  appreciate your ongoing commitment to your health goals. Please review the following plan we discussed and let me know if I can assist you in the future. Your Game plan/ To Do List    Referrals: If you haven't heard from the office you've been referred to, please reach out to them at the phone provided.  Referral for Lung Cancer Screening test. Follow up Visits: Next Medicare AWV with our clinical staff: 09/30/2024   Have you seen your provider in the last 6 months (3 months if uncontrolled diabetes)? Yes Next Office Visit with your provider: 12/11/2023  Clinician Recommendations:  Aim for 30 minutes of exercise or brisk walking, 6-8 glasses of water, and 5 servings of fruits and vegetables each day. Educated and advised on getting the COVID, Shingles, Pneumonia, and Tdap (Tetenus) vaccines in 2025.      This is a list of the screening recommended for you and due dates:  Health Maintenance  Topic Date Due   Zoster (Shingles) Vaccine (1 of 2) Never done   Eye exam for diabetics  09/23/2021   COVID-19 Vaccine (5 - 2024-25 season) 01/13/2023   Screening for Lung Cancer  10/07/2023   Pneumonia Vaccine (3 of 3 - PCV20 or PCV21) 10/24/2023*   DTaP/Tdap/Td vaccine (3 - Td or Tdap) 10/17/2023   Flu Shot  12/13/2023   Hemoglobin A1C  02/07/2024   Complete foot exam   04/28/2024   Yearly kidney function blood test for diabetes  08/06/2024   Yearly kidney health urinalysis for diabetes  08/06/2024   Medicare Annual Wellness Visit  09/26/2024   Mammogram  03/05/2025   Colon Cancer Screening  07/24/2025   DEXA scan (bone density measurement)  Completed   Hepatitis C Screening  Completed   HPV Vaccine  Aged Out   Meningitis B Vaccine  Aged Out  *Topic was postponed. The  date shown is not the original due date.    Advanced directives: (In Chart) A copy of your advanced directives are scanned into your chart should your provider ever need it. Advance Care Planning is important because it:  [x]  Makes sure you receive the medical care that is consistent with your values, goals, and preferences  [x]  It provides guidance to your family and loved ones and reduces their decisional burden about whether or not they are making the right decisions based on your wishes.  Follow the link provided in your after visit summary or read over the paperwork we have mailed to you to help you started getting your Advance Directives in place. If you need assistance in completing these, please reach out to us  so that we can help you!

## 2023-09-30 ENCOUNTER — Other Ambulatory Visit: Payer: Self-pay

## 2023-09-30 DIAGNOSIS — F17210 Nicotine dependence, cigarettes, uncomplicated: Secondary | ICD-10-CM

## 2023-09-30 DIAGNOSIS — Z122 Encounter for screening for malignant neoplasm of respiratory organs: Secondary | ICD-10-CM

## 2023-09-30 DIAGNOSIS — Z87891 Personal history of nicotine dependence: Secondary | ICD-10-CM

## 2023-10-01 DIAGNOSIS — H5213 Myopia, bilateral: Secondary | ICD-10-CM | POA: Diagnosis not present

## 2023-10-01 LAB — HM DIABETES EYE EXAM

## 2023-10-07 ENCOUNTER — Other Ambulatory Visit: Payer: Self-pay | Admitting: Internal Medicine

## 2023-10-07 DIAGNOSIS — F321 Major depressive disorder, single episode, moderate: Secondary | ICD-10-CM

## 2023-10-16 ENCOUNTER — Ambulatory Visit (HOSPITAL_COMMUNITY)
Admission: RE | Admit: 2023-10-16 | Discharge: 2023-10-16 | Disposition: A | Discharge status: Home / Self Care | Source: Ambulatory Visit | Attending: Internal Medicine | Admitting: Internal Medicine

## 2023-10-16 DIAGNOSIS — F17210 Nicotine dependence, cigarettes, uncomplicated: Secondary | ICD-10-CM

## 2023-10-16 DIAGNOSIS — Z87891 Personal history of nicotine dependence: Secondary | ICD-10-CM

## 2023-10-16 DIAGNOSIS — Z122 Encounter for screening for malignant neoplasm of respiratory organs: Secondary | ICD-10-CM | POA: Insufficient documentation

## 2023-10-16 DIAGNOSIS — K50919 Crohn's disease, unspecified, with unspecified complications: Secondary | ICD-10-CM | POA: Diagnosis not present

## 2023-10-16 DIAGNOSIS — K760 Fatty (change of) liver, not elsewhere classified: Secondary | ICD-10-CM | POA: Diagnosis not present

## 2023-10-16 DIAGNOSIS — K5090 Crohn's disease, unspecified, without complications: Secondary | ICD-10-CM | POA: Diagnosis not present

## 2023-10-16 DIAGNOSIS — K8020 Calculus of gallbladder without cholecystitis without obstruction: Secondary | ICD-10-CM | POA: Diagnosis not present

## 2023-10-16 MED ORDER — IOHEXOL 300 MG/ML  SOLN
100.0000 mL | Freq: Once | INTRAMUSCULAR | Status: AC | PRN
  Administered 2023-10-16: 100 mL via INTRAVENOUS

## 2023-10-17 ENCOUNTER — Ambulatory Visit: Payer: Self-pay | Admitting: Internal Medicine

## 2023-10-22 DIAGNOSIS — G4733 Obstructive sleep apnea (adult) (pediatric): Secondary | ICD-10-CM | POA: Diagnosis not present

## 2023-10-31 ENCOUNTER — Other Ambulatory Visit: Payer: Self-pay

## 2023-10-31 DIAGNOSIS — F17210 Nicotine dependence, cigarettes, uncomplicated: Secondary | ICD-10-CM

## 2023-10-31 DIAGNOSIS — Z87891 Personal history of nicotine dependence: Secondary | ICD-10-CM

## 2023-10-31 DIAGNOSIS — Z122 Encounter for screening for malignant neoplasm of respiratory organs: Secondary | ICD-10-CM

## 2023-11-04 DIAGNOSIS — Z9889 Other specified postprocedural states: Secondary | ICD-10-CM | POA: Diagnosis not present

## 2023-11-04 DIAGNOSIS — K5000 Crohn's disease of small intestine without complications: Secondary | ICD-10-CM | POA: Diagnosis not present

## 2023-11-04 DIAGNOSIS — Z720 Tobacco use: Secondary | ICD-10-CM | POA: Diagnosis not present

## 2023-11-04 DIAGNOSIS — Z8719 Personal history of other diseases of the digestive system: Secondary | ICD-10-CM | POA: Diagnosis not present

## 2023-11-05 ENCOUNTER — Other Ambulatory Visit: Payer: Self-pay | Admitting: Cardiology

## 2023-11-06 ENCOUNTER — Ambulatory Visit (INDEPENDENT_AMBULATORY_CARE_PROVIDER_SITE_OTHER)

## 2023-11-06 DIAGNOSIS — E538 Deficiency of other specified B group vitamins: Secondary | ICD-10-CM

## 2023-11-06 MED ORDER — CYANOCOBALAMIN 1000 MCG/ML IJ SOLN
1000.0000 ug | Freq: Once | INTRAMUSCULAR | Status: AC
Start: 2023-11-06 — End: 2023-11-06
  Administered 2023-11-06: 1000 ug via INTRAMUSCULAR

## 2023-11-06 NOTE — Progress Notes (Signed)
 After obtaining consent, and per orders of Dr. Yetta Barre, injection of B12 given by Ferdie Ping. Patient instructed to report any adverse reaction to me immediately.

## 2023-11-15 ENCOUNTER — Other Ambulatory Visit: Payer: Self-pay | Admitting: Internal Medicine

## 2023-11-15 DIAGNOSIS — I10 Essential (primary) hypertension: Secondary | ICD-10-CM

## 2023-11-15 DIAGNOSIS — E876 Hypokalemia: Secondary | ICD-10-CM

## 2023-11-29 ENCOUNTER — Other Ambulatory Visit: Payer: Self-pay | Admitting: Cardiology

## 2023-12-09 ENCOUNTER — Telehealth: Payer: Self-pay

## 2023-12-09 ENCOUNTER — Encounter: Admitting: Internal Medicine

## 2023-12-09 ENCOUNTER — Encounter: Payer: Self-pay | Admitting: Internal Medicine

## 2023-12-09 ENCOUNTER — Ambulatory Visit (INDEPENDENT_AMBULATORY_CARE_PROVIDER_SITE_OTHER): Admitting: Internal Medicine

## 2023-12-09 VITALS — BP 170/70 | HR 67 | Ht 61.42 in | Wt 214.5 lb

## 2023-12-09 DIAGNOSIS — K50918 Crohn's disease, unspecified, with other complication: Secondary | ICD-10-CM | POA: Diagnosis not present

## 2023-12-09 DIAGNOSIS — K50919 Crohn's disease, unspecified, with unspecified complications: Secondary | ICD-10-CM

## 2023-12-09 NOTE — Progress Notes (Signed)
 Assessment     Crohn's disease with ileitis and perianal disease. S/P TI and right colon resection in 2016 PVD on Plavix   Patient presents for follow-up of Crohn's disease.  Her last colonoscopy in 07/2022 showed some signs of neoterminal ileal erosions with biopsies suggesting active chronic inflammation due to Crohn's disease. Her recent labs including CRP and fecal calprotectin have been normal. Her CT enterography showed a possible sinus tract extending from the anus to the right aspect of the gluteal cleft. She has seen Dr. Sheldon since then and was just noted to have some scarring in that area. Overall patient's symptoms seem to be well controlled and she may have some mild endoscopic evidence of ileal disease. I did discuss the risks and benefits of switching therapies from budesonide . She would prefer to avoid injections and any risk of skin cancer (she has a history of a skin cancer lesion) if possible. Thus I thought that the IL23 medication may be the best choice for her due to fewer side effects. Will try to get her initiated on Skyrizi therapy. Alternatively could consider other IL23 medications such as Tremfya or Omvoh, or alternatively use Entyvio. If we are able to get her approved for one of these therapies, then can start a slow budesonide  taper at that time since she has been on budesonide  for a long time.    Recommendations    - Will try to see if we can get her approved for Skyrizi - Continue budesonide  9 mg every day. Once patient is approved for Nelma, then can start taper at that time - Next colonoscopy due in Mar 2027 for surveillance but an earlier colonoscopy may be considered if we are able to switch therapies for her - RTC in 3 months   HPI    This is a 68 year old female with history of Crohn's disease and azathioprine induced pancreatitis presents for follow-up of ileocolonic Crohn's disease. She was diagnosed with Crohn's disease at age 30 and had a small  intestine surgery at that time.  She then had a right hemicolectomy in 11/2017. She remains on budesonide  and has been on budesonide  9 mg daily since 09/2017. She was noted to have a perianal fistula starting in 03/2022 and had excision of a fistula tract in 05/2022 by Dr. Sheldon. Per documented notes, patient has not wanted to switch from budesonide  therapy  Colonoscopy 11/2009 by Dr. Rollin showed ICV scarring and hemorrhoids.   Colonoscopy 09/25/17: - A few erosions in the neo- terminal ileum. Biopsied. - Patent end- to- side ileo- colonic anastomosis, characterized by erosion. - The examination was otherwise normal on direct and retroflexion views.  Colonoscopy Mar 2024 - Patent end-to-side ileo-colonic anastomosis, characterized by neoterminal ileum erosions. Biopsied.  - Three 6 to 7 mm polyps in the sigmoid colon and in the transverse colon, removed with a cold snare. Resected and retrieved.  - Internal hemorrhoids.  - The examination was otherwise normal on direct and retroflexion views. Biopsied. Surgical [P], small bowel, terminal ileum - ACTIVE CHRONIC ILEITIS WITH EROSIONS, PSEUDOPYLORIC METAPLASIA AND REACTIVE CHANGES CONSISTENT WITH THE PATIENT'S CLINICAL HISTORY OF CROHN'S DISEASE - NEGATIVE FOR GRANULOMATA, DYSPLASIA OR MALIGNANCY 2. Surgical [P], colon, transverse, polyp (2) - POLYPOID LOW-GRADE DYSPLASIA (1 FRAGMENT) - HYPERPLASTIC POLYP(S) (2 FRAGMENTS) - NEGATIVE FOR HIGH-GRADE DYSPLASIA OR MALIGNANCY - There is a single, small fragment of polypoid low-grade dysplasia present on sections examined. This may represent a small, sporadic tubular adenoma or polypoid low grade  dysplasia in the setting of inflammatory bowel disease. Clinical correlation recommended. 3. Surgical [P], random colon bx - BENIGN COLONIC MUCOSA - NEGATIVE FOR SIGNIFICANT CHRONIC CHANGES, ACTIVE INFLAMMATION, GRANULOMATA, DYSPLASIA OR MALIGNANCY  Interval History: She has 3 BMs per day, which is normal for  her. Denies blood in the stools. Denies ab pain. Denies acid reflux symptoms. Denies dysphagia. Weight has been up and down  Wt Readings from Last 3 Encounters:  12/09/23 214 lb 8 oz (97.3 kg)  09/27/23 217 lb (98.4 kg)  09/16/23 217 lb (98.4 kg)    Labs / Imaging       Latest Ref Rng & Units 10/07/2022    7:10 PM 03/27/2022   10:18 AM 06/21/2021   10:44 AM  Hepatic Function  Total Protein 6.5 - 8.1 g/dL 8.2  7.1  6.8   Albumin  3.5 - 5.0 g/dL 4.1  4.1  3.9   AST 15 - 41 U/L 23  17  14    ALT 0 - 44 U/L 19  21  18    Alk Phosphatase 38 - 126 U/L 66  48  44   Total Bilirubin 0.3 - 1.2 mg/dL 1.3  0.4  0.6   Bilirubin, Direct 0.0 - 0.2 mg/dL <9.8   0.1        Latest Ref Rng & Units 08/07/2023    3:17 PM 05/16/2023    4:00 PM 10/07/2022    7:10 PM  CBC  WBC 4.0 - 10.5 K/uL 11.9  12.4  16.4   Hemoglobin 12.0 - 15.0 g/dL 85.3  85.4  82.3   Hematocrit 36.0 - 46.0 % 44.9  44.8  54.4   Platelets 150.0 - 400.0 K/uL 312.0  292.0  308      CT CHEST LUNG CA SCREEN LOW DOSE W/O CM CLINICAL DATA:  68 year old female current smoker with greater than 20 pack-year smoking history  EXAM: CT CHEST WITHOUT CONTRAST LOW-DOSE FOR LUNG CANCER SCREENING  TECHNIQUE: Multidetector CT imaging of the chest was performed following the standard protocol without IV contrast.  RADIATION DOSE REDUCTION: This exam was performed according to the departmental dose-optimization program which includes automated exposure control, adjustment of the mA and/or kV according to patient size and/or use of iterative reconstruction technique.  COMPARISON:  09/14/2022 screening chest CT and 09/27/2022 chest CT angiogram  FINDINGS: Cardiovascular: Normal heart size. No significant pericardial effusion/thickening. Three-vessel coronary atherosclerosis. Atherosclerotic nonaneurysmal thoracic aorta. Normal caliber pulmonary arteries.  Mediastinum/Nodes: No significant thyroid  nodules. Unremarkable esophagus. No  pathologically enlarged axillary, mediastinal or hilar lymph nodes, noting limited sensitivity for the detection of hilar adenopathy on this noncontrast study.  Lungs/Pleura: No pneumothorax. No pleural effusion. Mild centrilobular emphysema with diffuse bronchial wall thickening. No acute consolidative airspace disease or lung masses. Stable solid 3.9 mm medial left upper lobe nodule on series 4/image 119. No new significant pulmonary nodules.  Upper abdomen: Small hiatal hernia.  Diffuse hepatic steatosis.  Musculoskeletal: No aggressive appearing focal osseous lesions. Moderate thoracic spondylosis.  IMPRESSION: 1. Lung-RADS 2, benign appearance or behavior. Continue annual screening with low-dose chest CT without contrast in 12 months. 2. Three-vessel coronary atherosclerosis. 3. Small hiatal hernia. 4. Diffuse hepatic steatosis. 5. Aortic Atherosclerosis (ICD10-I70.0) and Emphysema (ICD10-J43.9).  Electronically Signed   By: Selinda DELENA Blue M.D.   On: 10/30/2023 23:46  Current Medications, Allergies, Past Medical History, Past Surgical History, Family History and Social History were reviewed in [employer] record.   Physical Exam: General: Well developed, well nourished,  no acute distress Head: Normocephalic and atraumatic Eyes: Sclerae anicteric, EOMI Ears: Normal auditory acuity Mouth: No deformities or lesions noted Lungs: Clear throughout to auscultation Heart: Regular rate and rhythm; No murmurs, rubs or bruits Abdomen: Soft, non tender and non distended. No masses, hepatosplenomegaly or hernias noted. Normal Bowel sounds Rectal: Not done Musculoskeletal: Symmetrical with no gross deformities  Pulses:  Normal pulses noted Extremities: No edema or deformities noted Neurological: Alert oriented x 4, grossly nonfocal Psychological:  Alert and cooperative. Normal mood and affect   Rosario JAYSON Kidney, MD 12/09/2023, 2:40 PM   I spent 45 minutes of  time, including in depth chart review, independent review of results as outlined above, communicating results with the patient directly, face-to-face time with the patient, coordinating care, ordering studies and medications as appropriate, and documentation.

## 2023-12-09 NOTE — Telephone Encounter (Signed)
 Patient has been referred to [facility].   Records, demographics, and insurance information have been faxed to [facility] at [phone].  Email sent to clinical liaison [name] Bohr.

## 2023-12-09 NOTE — Patient Instructions (Addendum)
 Will try to see if we can get you approved for [facility]     If your blood pressure at your visit was 140/90 or greater, please contact your primary care physician to follow up on this.  _______________________________________________________  If you are age 68 or older, your body mass index should be between 23-30. Your Body mass index is 39.98 kg/m. If this is out of the aforementioned range listed, please consider follow up with your Primary Care Provider.  If you are age 77 or younger, your body mass index should be between 19-25. Your Body mass index is 39.98 kg/m. If this is out of the aformentioned range listed, please consider follow up with your Primary Care Provider.   ________________________________________________________  The [facility] providers would like to encourage you to use MYCHART to communicate with providers for non-urgent requests or questions.  Due to long hold times on the telephone, sending your provider a message by [facility] may be a faster and more efficient way to get a response.  Please allow 48 business hours for a response.  Please remember that this is for non-urgent requests.  _______________________________________________________  [facility] is using a team-based approach to care.  Your team is made up of your doctor and two to three APPS. Our APPS (Nurse Practitioners and Physician Assistants) work with your physician to ensure care continuity for you. They are fully qualified to address your health concerns and develop a treatment plan. They communicate directly with your gastroenterologist to care for you. Seeing the Advanced Practice Practitioners on your physician's team can help you by facilitating care more promptly, often allowing for earlier appointments, access to diagnostic testing, procedures, and other specialty referrals.

## 2023-12-09 NOTE — Telephone Encounter (Signed)
-----   Message from Rosario JAYSON Kidney sent at 12/09/2023  4:09 PM EDT ----- Hi Pod B triage, let's try to initiate this patient on Skyrizi if this can be approved by the patient's insurance. Would plan for IV induction 600 mg at 0, 4, and 8 weeks, followed by SQ 360 mg at week 12 and then every 8 weeks afterwards. Thanks.

## 2023-12-11 ENCOUNTER — Ambulatory Visit: Admitting: Internal Medicine

## 2023-12-11 ENCOUNTER — Encounter: Payer: Self-pay | Admitting: Internal Medicine

## 2023-12-11 VITALS — BP 138/86 | HR 60 | Temp 98.3°F | Resp 16 | Ht 61.42 in | Wt 215.0 lb

## 2023-12-11 DIAGNOSIS — I10 Essential (primary) hypertension: Secondary | ICD-10-CM | POA: Diagnosis not present

## 2023-12-11 DIAGNOSIS — H6123 Impacted cerumen, bilateral: Secondary | ICD-10-CM

## 2023-12-11 DIAGNOSIS — E785 Hyperlipidemia, unspecified: Secondary | ICD-10-CM | POA: Diagnosis not present

## 2023-12-11 DIAGNOSIS — G720 Drug-induced myopathy: Secondary | ICD-10-CM

## 2023-12-11 DIAGNOSIS — Z716 Tobacco abuse counseling: Secondary | ICD-10-CM

## 2023-12-11 DIAGNOSIS — Z23 Encounter for immunization: Secondary | ICD-10-CM | POA: Diagnosis not present

## 2023-12-11 DIAGNOSIS — Z0001 Encounter for general adult medical examination with abnormal findings: Secondary | ICD-10-CM | POA: Diagnosis not present

## 2023-12-11 DIAGNOSIS — T466X5A Adverse effect of antihyperlipidemic and antiarteriosclerotic drugs, initial encounter: Secondary | ICD-10-CM

## 2023-12-11 MED ORDER — SHINGRIX 50 MCG/0.5ML IM SUSR: 0.5000 mL | Freq: Once | INTRAMUSCULAR | 1 refills | Status: AC

## 2023-12-11 MED ORDER — BOOSTRIX 5-2.5-18.5 LF-MCG/0.5 IM SUSP: 0.5000 mL | Freq: Once | INTRAMUSCULAR | 0 refills | Status: AC

## 2023-12-11 NOTE — Patient Instructions (Signed)

## 2023-12-11 NOTE — Progress Notes (Unsigned)
 Subjective:  Patient ID: April May, female    DOB: [birth year]  Age: 68 y.o. MRN: [id number]  CC: Annual Exam (Ears cleaned and patient wants to discuss her blood pressure readings. Patient also states she had a cat scan done and it showed that she has a small hernia in her stomach. )   HPI Douglas Rooks presents for a CPX and f/up -----  Discussed the use of AI scribe software for clinical note transcription with the patient, who gave verbal consent to proceed.  History of Present Illness April May is a 68 year old female with hypertension who presents for a follow-up visit.  She has been experiencing persistently high blood pressure, which she attributes to her continued smoking habit. She has not started using nicotine  patches. No headaches, blurred vision, chest pain, or shortness of breath. She is concerned about potential weight gain as a reason for not quitting smoking. No coughing or wheezing associated with her smoking.  She inquires about ear cleaning, noting that her ears tend to get blocked with wax.  She notes variability in her blood pressure readings, fluctuating from 138/76 to 170 at different times.    Outpatient Medications Prior to Visit  Medication Sig Dispense Refill   albuterol  (PROAIR  HFA) 108 (90 Base) MCG/ACT inhaler TAKE 2 PUFFS BY MOUTH EVERY 6 HOURS AS NEEDED FOR WHEEZE OR SHORTNESS OF BREATH (Patient taking differently: Inhale 2 puffs into the lungs every 6 (six) hours as needed for shortness of breath or wheezing. TAKE 2 PUFFS BY MOUTH EVERY 6 HOURS AS NEEDED FOR WHEEZE OR SHORTNESS OF BREATH) 18 g 5   amLODipine  (NORVASC ) 10 MG tablet TAKE 1 TABLET BY MOUTH DAILY 100 tablet 0   budesonide  (ENTOCORT EC ) 3 MG 24 hr capsule Take 3 capsules (9 mg total) by mouth daily. 270 capsule 3   buPROPion  (WELLBUTRIN  XL) 150 MG 24 hr tablet TAKE 1 TABLET BY MOUTH DAILY 100 tablet 2   Cholecalciferol  (VITAMIN D3) 1.25 MG (50000 UT)  capsule TAKE 1 CAPSULE BY MOUTH ONE TIME PER WEEK 12 capsule 0   clindamycin (CLEOCIN T) 1 % lotion      clonazePAM  (KLONOPIN ) 0.5 MG tablet Take 1 tablet (0.5 mg total) by mouth 2 (two) times daily as needed for anxiety. 60 tablet 3   clopidogrel  (PLAVIX ) 75 MG tablet TAKE 1 TABLET BY MOUTH EVERY DAY 90 tablet 3   [employer] PAIN RELIEF EXTRA STRENGTH 500 MG tablet TAKE 1 TABLET BY MOUTH EVERY 8 HOURS AS NEEDED FOR MODERATE PAIN (Patient taking differently: Take 500-1,000 mg by mouth every 8 (eight) hours as needed for moderate pain (pain score 4-6).) 60 tablet 5   Cyanocobalamin  (B-12 COMPLIANCE INJECTION) 1000 MCG/ML KIT Inject as directed every 30 (thirty) days.     ezetimibe  (ZETIA ) 10 MG tablet Take 1 tablet (10 mg total) by mouth daily. 15 tablet 0   fluticasone  (FLONASE ) 50 MCG/ACT nasal spray Place 2 sprays into both nostrils daily. 48 g 3   lamoTRIgine  (LAMICTAL ) 200 MG tablet TAKE 1 TABLET BY MOUTH AT  BEDTIME 100 tablet 2   Potassium Citrate  15 MEQ (1620 MG) TBCR Take 1 tablet by mouth in the morning, at noon, and at bedtime. Schedule an appt for further refills 90 tablet 0   spironolactone  (ALDACTONE ) 100 MG tablet TAKE 1 TABLET BY MOUTH DAILY 80 tablet 3   STIOLTO RESPIMAT  2.5-2.5 MCG/ACT AERS USE 2 INHALATIONS BY MOUTH  DAILY (Patient taking differently: as needed.)  12 g 1   triamcinolone  cream (KENALOG) 0.1 % Apply 1 Application topically as needed.     No facility-administered medications prior to visit.    ROS Review of Systems  HENT:  Positive for hearing loss.     Objective:  BP 138/86 (BP Location: Left Arm, Patient Position: Sitting, Cuff Size: Normal)   Pulse 60   Temp 98.3 F (36.8 C) (Oral)   Resp 16   Ht 5' 1.42 (1.56 m)   Wt 215 lb (97.5 kg)   LMP 05/14/1988   SpO2 98%   BMI 40.07 kg/m   BP Readings from Last 3 Encounters:  12/11/23 138/86  12/09/23 (!) 170/70  09/16/23 (!) 140/84    Wt Readings from Last 3 Encounters:  12/11/23 215 lb (97.5 kg)   12/09/23 214 lb 8 oz (97.3 kg)  09/27/23 217 lb (98.4 kg)    Physical Exam Vitals reviewed.  Constitutional:      Appearance: Normal appearance.  HENT:     Right Ear: Decreased hearing noted. There is impacted cerumen.     Left Ear: Decreased hearing noted. There is impacted cerumen.     Mouth/Throat:     Mouth: Mucous membranes are moist.  Eyes:     General: No scleral icterus.    Conjunctiva/sclera: Conjunctivae normal.  Cardiovascular:     Rate and Rhythm: Regular rhythm. Bradycardia present.     Heart sounds: No murmur heard.    No friction rub. No gallop.     Comments: EKG---- SB, 53 bpm NS T wave changes No LVH or Q waves Unchanged  Pulmonary:     Effort: Pulmonary effort is normal.     Breath sounds: No stridor. No wheezing, rhonchi or rales.  Abdominal:     General: Abdomen is flat.     Palpations: There is no mass.     Tenderness: There is no abdominal tenderness. There is no guarding.     Hernia: No hernia is present.  Musculoskeletal:        General: Normal range of motion.     Cervical back: Neck supple.     Right lower leg: No edema.     Left lower leg: No edema.  Lymphadenopathy:     Cervical: No cervical adenopathy.  Skin:    General: Skin is warm and dry.  Neurological:     General: No focal deficit present.     Mental Status: She is alert. Mental status is at baseline.  Psychiatric:        Mood and Affect: Mood normal.        Behavior: Behavior normal.     Lab Results  Component Value Date   WBC 11.9 (H) 08/07/2023   HGB 14.6 08/07/2023   HCT 44.9 08/07/2023   PLT 312.0 08/07/2023   GLUCOSE 95 08/07/2023   CHOL 123 10/24/2022   TRIG 100.0 10/24/2022   HDL 55.00 10/24/2022   LDLCALC 48 10/24/2022   ALT 19 10/07/2022   AST 23 10/07/2022   NA 139 08/07/2023   K 3.8 08/07/2023   CL 104 08/07/2023   CREATININE 0.85 08/07/2023   BUN 9 08/07/2023   CO2 28 08/07/2023   TSH 2.97 10/24/2022   HGBA1C 5.9 08/07/2023   MICROALBUR 1.1  08/07/2023    CT CHEST LUNG CA SCREEN LOW DOSE W/O CM Result Date: 10/30/2023 CLINICAL DATA:  68 year old female current smoker with greater than 20 pack-year smoking history EXAM: CT CHEST WITHOUT CONTRAST LOW-DOSE FOR LUNG CANCER  SCREENING TECHNIQUE: Multidetector CT imaging of the chest was performed following the standard protocol without IV contrast. RADIATION DOSE REDUCTION: This exam was performed according to the departmental dose-optimization program which includes automated exposure control, adjustment of the mA and/or kV according to patient size and/or use of iterative reconstruction technique. COMPARISON:  09/14/2022 screening chest CT and 09/27/2022 chest CT angiogram FINDINGS: Cardiovascular: Normal heart size. No significant pericardial effusion/thickening. Three-vessel coronary atherosclerosis. Atherosclerotic nonaneurysmal thoracic aorta. Normal caliber pulmonary arteries. Mediastinum/Nodes: No significant thyroid  nodules. Unremarkable esophagus. No pathologically enlarged axillary, mediastinal or hilar lymph nodes, noting limited sensitivity for the detection of hilar adenopathy on this noncontrast study. Lungs/Pleura: No pneumothorax. No pleural effusion. Mild centrilobular emphysema with diffuse bronchial wall thickening. No acute consolidative airspace disease or lung masses. Stable solid 3.9 mm medial left upper lobe nodule on series 4/image 119. No new significant pulmonary nodules. Upper abdomen: Small hiatal hernia.  Diffuse hepatic steatosis. Musculoskeletal: No aggressive appearing focal osseous lesions. Moderate thoracic spondylosis. IMPRESSION: 1. Lung-RADS 2, benign appearance or behavior. Continue annual screening with low-dose chest CT without contrast in 12 months. 2. Three-vessel coronary atherosclerosis. 3. Small hiatal hernia. 4. Diffuse hepatic steatosis. 5. Aortic Atherosclerosis (ICD10-I70.0) and Emphysema (ICD10-J43.9). Electronically Signed   By: Selinda DELENA Blue M.D.   On:  10/30/2023 23:46   CT ENTERO ABD/PELVIS W CONTAST Result Date: 10/16/2023 EXAMINATION: CT ENTERO ABDOMEN PELVIS W CONTRAST CLINICAL INDICATION: Female, 68 years old. Crohn's disease TECHNIQUE: CT of the abdomen and pelvis with 100 cc Omnipaque  300 intravenous contrast. 0.5 mg dose of intravenous glucagon was administered prior to imaging. Volumen was used as a negative oral contrast material. Multiplanar reconstructions were generated. Unless otherwise specified, incidental thyroid , adrenal, renal lesions do not require dedicated imaging follow up. Additionally, any mentioned pulmonary nodules do not require dedicated imaging follow-up based on the Fleischner guidelines unless otherwise specified. Coronary calcifications are not identified unless otherwise specified. COMPARISON: 07/27/2016, 04/04/2022 FINDINGS: The lung bases are clear. The heart is normal in size. Small hiatal hernia. There is hepatic steatosis. There is cholelithiasis. Spleen is normal. Pancreas is normal. The adrenals are normal. The kidneys are normal. The abdominal aorta is normal in caliber. Scattered atherosclerotic changes are present. The bladder is normal. The uterus is surgically absent. Right hemicolectomy noted. Large and small bowel loops are otherwise within normal limits though there may be a sinus tract extending from the anus to the right aspect of the gluteal cleft which is incompletely imaged. There are mild degenerative changes of the spine. IMPRESSION: Status post right hemicolectomy with possible sinus tract seen extending from the anus to the right aspect of the gluteal cleft which is incompletely imaged. No evidence for active inflammatory bowel disease. DOSE REDUCTION: This exam was performed according to our departmental dose-optimization program which includes automated exposure control, adjustment of the mA and/or kV according to patient size and/or use of iterative reconstruction technique. Electronically signed by:  Italy Engel MD 10/16/2023 06:33 PM EDT RP Workstation: [id number]    Assessment & Plan:  Malignant hypertension -     EKG 12-Lead -     Basic metabolic panel with GFR; Future -     TSH; Future  Tobacco abuse counseling -     AMB Referral [facility] Care Management  Encounter for general adult medical examination with abnormal findings  Need for prophylactic vaccination with combined diphtheria-tetanus-pertussis (DTP) vaccine -     Boostrix ; Inject 0.5 mLs into the muscle once for 1 dose.  Dispense: 0.5  mL; Refill: 0  Need for prophylactic vaccination and inoculation against varicella -     Shingrix ; Inject 0.5 mLs into the muscle once for 1 dose.  Dispense: 0.5 mL; Refill: 1  Bilateral hearing loss due to cerumen impaction  Hyperlipidemia LDL goal <130 -     Lipid panel; Future -     Hepatic function panel; Future -     TSH; Future  Statin myopathy  Immunization due -     Pneumococcal polysaccharide vaccine 23-valent greater than or equal to 2yo subcutaneous/IM     Follow-up: Return in about 6 months (around 06/12/2024).  Debby Molt, MD

## 2023-12-12 ENCOUNTER — Ambulatory Visit: Payer: Self-pay | Admitting: Internal Medicine

## 2023-12-12 LAB — HEPATIC FUNCTION PANEL
ALT: 19 U/L (ref 0–35)
AST: 18 U/L (ref 0–37)
Albumin: 4.3 g/dL (ref 3.5–5.2)
Alkaline Phosphatase: 57 U/L (ref 39–117)
Bilirubin, Direct: 0.1 mg/dL (ref 0.0–0.3)
Total Bilirubin: 0.6 mg/dL (ref 0.2–1.2)
Total Protein: 7.2 g/dL (ref 6.0–8.3)

## 2023-12-12 LAB — BASIC METABOLIC PANEL WITH GFR
BUN: 9 mg/dL (ref 6–23)
CO2: 33 meq/L — ABNORMAL HIGH (ref 19–32)
Calcium: 9.7 mg/dL (ref 8.4–10.5)
Chloride: 101 meq/L (ref 96–112)
Creatinine, Ser: 0.8 mg/dL (ref 0.40–1.20)
GFR: 75.87 mL/min (ref >60.00)
Glucose, Bld: 96 mg/dL (ref 70–99)
Potassium: 3.6 meq/L (ref 3.5–5.1)
Sodium: 142 meq/L (ref 135–145)

## 2023-12-12 LAB — LIPID PANEL
Cholesterol: 125 mg/dL (ref 0–200)
HDL: 54.9 mg/dL (ref >39.00)
LDL Cholesterol: 52 mg/dL (ref 0–99)
NonHDL: 70.06
Total CHOL/HDL Ratio: 2
Triglycerides: 90 mg/dL (ref 0.0–149.0)
VLDL: 18 mg/dL (ref 0.0–40.0)

## 2023-12-12 LAB — TSH: TSH: 1.58 u[IU]/mL (ref 0.35–5.50)

## 2023-12-13 ENCOUNTER — Other Ambulatory Visit: Payer: Self-pay | Admitting: Cardiology

## 2023-12-16 NOTE — Telephone Encounter (Signed)
 Email from Fernando Salinas Bodine 12/16/23:  Hi Sukhmani Fetherolf, Good morning and Happy Monday! Wanted to circle back to let you know we've received PA approval for P. Doublin for [employer]. We will contact the patient this week to schedule her SOC. As soon as we have a confirmed date/time, I will let you know.

## 2023-12-17 ENCOUNTER — Encounter: Payer: Self-pay | Admitting: Internal Medicine

## 2023-12-24 ENCOUNTER — Encounter: Payer: Self-pay | Admitting: Internal Medicine

## 2023-12-24 NOTE — Telephone Encounter (Signed)
 Called and spoke with patient to clarify MyChart message. Patient stated that everything is fine now, she is talking with a nurse to get scheduled for [facility].

## 2023-12-25 NOTE — Telephone Encounter (Signed)
 MyChart message sent to patient with recommendations.

## 2023-12-25 NOTE — Telephone Encounter (Signed)
 Email from [name] Bohr with [facility]:  Ms. April May is scheduled for 1st Skyrizi infusion on 8/20. Also wanted to share some information the patient gave our office RN to see if you could assist the patient with. Please see below.  Ms. April May reports that she thinks she is supposed to taper her Budesonide  prior to starting the Skyrizi but unsure how she is to do this. She is currently taking 9mg  daily. I told her we would message Dr. Federico and see what the plan is.  Dr. Federico, please advise. Thanks

## 2023-12-26 ENCOUNTER — Ambulatory Visit (INDEPENDENT_AMBULATORY_CARE_PROVIDER_SITE_OTHER)

## 2023-12-26 ENCOUNTER — Other Ambulatory Visit: Payer: Self-pay | Admitting: Cardiology

## 2023-12-26 ENCOUNTER — Telehealth: Payer: Self-pay | Admitting: *Deleted

## 2023-12-26 DIAGNOSIS — E538 Deficiency of other specified B group vitamins: Secondary | ICD-10-CM

## 2023-12-26 MED ORDER — CYANOCOBALAMIN 1000 MCG/ML IJ SOLN
1000.0000 ug | Freq: Once | INTRAMUSCULAR | Status: AC
  Administered 2023-12-26: 1000 ug via INTRAMUSCULAR

## 2023-12-26 NOTE — Progress Notes (Signed)
 Care Guide Pharmacy Note  12/26/2023 Name: Lydie Stammen MRN: [id number] DOB: 04/20/1956  Referred By: Joshua Debby CROME, MD Reason for referral: Call Attempt #1 and Complex Care Management (Outreach to schedule referral with pharmacist )   Tilton Janise Musto is a 68 y.o. year old female who is a primary care patient of Joshua Debby CROME, MD.  Tilton Janise Ferrall was referred to the pharmacist for assistance related to:    Successful contact was made with the patient to discuss pharmacy services including being ready for the pharmacist to call at least 5 minutes before the scheduled appointment time and to have medication bottles and any blood pressure readings ready for review. The patient agreed to meet with the pharmacist via telephone visit on 01/14/2024  Thedford Franks, CMA, Care Guide [facility], [facility] Guide Direct Dial: [phone]  Fax: [phone] Website: [email or web address]

## 2023-12-26 NOTE — Progress Notes (Signed)
 Care Guide Pharmacy Note  12/26/2023 Name: April May MRN: [id number] DOB: 1955/12/30  Referred By: Joshua Debby CROME, MD Reason for referral: Call Attempt #1 and Complex Care Management (Outreach to schedule referral with pharmacist )   [name] is a 68 y.o. year old female who is a primary care patient of Joshua Debby CROME, MD.  [name] was referred to the pharmacist for assistance related to: tobacco cessation   An unsuccessful telephone outreach was attempted today to contact the patient who was referred to the pharmacy team for assistance with medication management. Additional attempts will be made to contact the patient.  Thedford Franks, CMA [place]  [facility] Guide Direct Dial: [phone]  Fax: [phone] Website: [email or web address]

## 2023-12-26 NOTE — Progress Notes (Signed)
 After obtaining consent, and per orders of Dr. Joshua, injection of B12 given by Ronnald SHAUNNA Palms. Patient instructed to report any adverse reaction to me immediately.

## 2024-01-02 DIAGNOSIS — K5090 Crohn's disease, unspecified, without complications: Secondary | ICD-10-CM | POA: Diagnosis not present

## 2024-01-02 DIAGNOSIS — K50011 Crohn's disease of small intestine with rectal bleeding: Secondary | ICD-10-CM | POA: Diagnosis not present

## 2024-01-09 DIAGNOSIS — K5090 Crohn's disease, unspecified, without complications: Secondary | ICD-10-CM | POA: Diagnosis not present

## 2024-01-14 ENCOUNTER — Other Ambulatory Visit (INDEPENDENT_AMBULATORY_CARE_PROVIDER_SITE_OTHER): Admitting: Pharmacist

## 2024-01-14 DIAGNOSIS — Z716 Tobacco abuse counseling: Secondary | ICD-10-CM

## 2024-01-14 NOTE — Patient Instructions (Signed)
 It was a pleasure speaking with you today!  Continue bupropion   Consider starting nicotine  patches (14mg ) and nicotine  gum (2mg ) to help smoke less Continue to discuss smoking cessation with Dr. Joshua and myself if needed  Feel free to call with any questions or concerns!  Darrelyn Drum, PharmD, BCPS, CPP Clinical Pharmacist Practitioner [facility] [phone]

## 2024-01-14 NOTE — Progress Notes (Signed)
01/14/2024 Name: April May MRN: [id number] DOB: [birth year]  Chief Complaint  Patient presents with   Medication Management   Nicotine  Dependence    April May is a 68 y.o. year old female who presented for a telephone visit.   They were referred to the pharmacist by their PCP for assistance in managing smoking cessation.   Subjective:   Care Team: Primary Care Provider: Joshua Debby CROME, MD ; Next Scheduled Visit: Primary Care Physician (Dr. Joshua) not scheduled  Medication Access/Adherence  Current Pharmacy:  [facility] [place], [place] - [address] [place] [address] Phone: [phone] Fax: [phone]   Patient reports affordability concerns with their medications: Yes  Patient reports access/transportation concerns to their pharmacy: No  Patient reports adherence concerns with their medications:  Yes  Bupropion , for mood   Tobacco Abuse:  Tobacco Use History: Age when started using tobacco on a daily basis Unknown Number of cigarettes per day 10 Smokes first cigarette >30 minutes after waking (typically when she travels and/or 3-4 hours after waking) Unknown if she wakes at night to smoke Triggers are unknown  Patient reports no current attempts to quit smoking, but has some desire to. Currently smokes primarily when traveling/in her car, but also smokes at home. Has reduced her use over time to roughly 1 pack per two days (~10 cigarettes/day). Patient has called 1-800-QUIT-NOW before, has attempted to receive NRT through Medicaid, but her current coverage does not cover NRT.  Quit Attempt History: Most recent quit attempt is unknown Longest time ever been tobacco free is unknown Methods tried in the past include Bupropion  (Zyban ) and Nicotine  Replacement (Patch). Currently has 21mg  patches Motivators to quitting are unknown; barriers include fear of weight gain based on past  visit notes.  Current medication access support: navigating Medicaid coverage of NRT   Objective:  Lab Results  Component Value Date   HGBA1C 5.9 08/07/2023    Lab Results  Component Value Date   CREATININE 0.80 12/11/2023   BUN 9 12/11/2023   NA 142 12/11/2023   K 3.6 12/11/2023   CL 101 12/11/2023   CO2 33 (H) 12/11/2023    Lab Results  Component Value Date   CHOL 125 12/11/2023   HDL 54.90 12/11/2023   LDLCALC 52 12/11/2023   TRIG 90.0 12/11/2023   CHOLHDL 2 12/11/2023    Medications Reviewed Today   Medications were not reviewed in this encounter       Assessment/Plan:   Tobacco Abuse - Currently uncontrolled. The barrier of NRT not being covered by Medicaid seems to be the reason for optimizing pharmacotherapy to support cessation at this time. Will continue to monitor for when she can afford NRT and/or when NRT can be accessed through alternative means. - Provided motivational interviewing to assess tobacco use and strategies for reduction - Provided information on 1 800 QUIT NOW support program - Patient unwilling to increase bupropion  to 150 mg BID at this time - Recommend nicotine  patch 14 mg daily for long term NRT. Apply one 14 mg patch daily for 6 weeks. Then, reduce to one 7 mg patch daily, if able.  - Recommend to also start nicotine  gum 2mg  as needed for short term NRT   Follow Up Plan: None for pharmacy, continue to discuss cessation with PCP Dr. Joshua.  Darrelyn Drum, PharmD, BCPS, CPP Clinical Pharmacist Practitioner [facility] [facility]  [phone]    

## 2024-01-20 NOTE — Progress Notes (Deleted)
 [facility] [address] [place]. [place] [address] [phone]       OFFICE FOLLOW UP NOTE  Ms. April May Date of Birth:  07-13-1955 Medical Record Number:  [id number]   Primary neurologist: Dr. Buck Reason for visit: CPAP follow-up    SUBJECTIVE:   CHIEF COMPLAINT:  No chief complaint on file.  Follow-up visit:  Prior visit: 01/21/2023  Brief HPI:   April May is a 68 y.o. female who was initially evaluated by Dr. Buck on 12/29/2019 with known history of sleep apnea (dx'd 2013 after sleep study) not on CPAP due to difficulty tolerating.  Complaining of lack of energy and daytime naps.  ESS 9/24. FSS 61/63.  Repeat sleep study 01/05/2020 showed moderate sleep apnea with total AHI 15.9/h and O2 nadir of 82%.  CPap initiated 08/08/2020. DME [facility].   At prior visit, pressure setting increased from 9 to 10 as she felt she was not getting enough air at times.  Follow-up via MyChart 2 months after adjustment with patient noting improvement after setting adjustment.      Interval history:  Returns for follow-up CPAP visit.  Compliance report shows satisfactory usage and optimal residual AHI. Current use of nasal pillow mask. She does prefer to sleep on her stomach/side, will have some issues with straps pressing into her face or mask itself will shift, she is otherwise tolerating the mask well. Doesn't feel like she is getting enough air, she is requesting the pressure to be increased.  Routinely followed by [facility].  ESS 6/24.           ROS:   14 system review of systems performed and negative with exception of those listed in HPI  PMH:  Past Medical History:  Diagnosis Date   Abrasion 05/10/2022   right foot   Anal fistula    Anticoagulated    plavix  and asa--- managed by vascular, dr serene   Arthritis    B12 deficiency    CKD (chronic kidney disease), stage I    [facility] --- samantha collins PA  (lov in epic/ media tab 02-23-2022)   COPD (chronic obstructive pulmonary disease) (HCC)    Coronary artery calcification seen on CAT scan    cardiologist--- dr jeffrie;    nuclear stress test 10-29-2017  normal perfusion , nuclear ef 60%;   echo 10-16-2017  ef 55-60%, mild TR   Crohn's disease of both small and large intestine (HCC)    followed by dr stark  (GI   Full dentures    GAD (generalized anxiety disorder)    History of acute pancreatitis    secondary to azathioprine   History of small bowel obstruction 11/24/2014   admission in epic  for crohn's flare w/ sbo and abscess s/p  surgical intervention   History of squamous cell carcinoma of skin 06/11/2017   excision left arm   Hyperlipidemia, mixed    followed by cardiology and pcp;   statin intolerate   MDD (major depressive disorder)    OSA on CPAP    followed by dr inocente ;  (05-16-2022  per pt uses nightly)   study in epic 01-05-2020  AHI 15.4/hr;  does not use cpap since weight loss 1 and 1/2 years ago   Perianal lesion    Peripheral vascular disease (HCC) 02/2022   vascular--- dr serene;   03-20-2022  s/p  balloon angioplasty right occluded peroneal artery, right anterior tibial artery, right tibioperoneal truck   PONV (  postoperative nausea and vomiting)    Pre-diabetes    Resistant hypertension    followed by pcp and cardiology   Swelling of right foot 05/10/2022   Vitamin D  deficiency     PSH:  Past Surgical History:  Procedure Laterality Date   ABDOMINAL AORTOGRAM W/LOWER EXTREMITY N/A 03/20/2022   Procedure: ABDOMINAL AORTOGRAM W/LOWER EXTREMITY;  Surgeon: Serene Gaile ORN, MD;  [facility]: [facility];  Service: Cardiovascular;  Laterality: N/A;   ABDOMINAL HYSTERECTOMY     age 9;  per pt thinks ovaries were removed   APPENDECTOMY     teen   APPLICATION OF WOUND VAC  11/24/2014   Procedure: APPLICATION OF WOUND VAC;  Surgeon: Donnice Lima, MD;  Location: [facility];  Service: General;;   BOWEL RESECTION N/A  11/24/2014   Procedure: SMALL BOWEL RESECTION;  Surgeon: Donnice Lima, MD;  Location: [facility];  Service: General;  Laterality: N/A;   BREAST EXCISIONAL BIOPSY Right 1972   benign   LAPAROTOMY N/A 11/24/2014   Procedure: EXPLORATORY LAPAROTOMY EXTENSIVE LYSIS OF ADHESIONS SAMLL BOWEL RESECTION RIGHT HEMI COLECTOMY WOUND VAC APPLICATION. ;  Surgeon: Donnice Lima, MD;  Location: [facility];  Service: General;  Laterality: N/A;   LYSIS OF ADHESION  11/24/2014   Procedure: LYSIS OF ADHESION (3 HRS);  Surgeon: Donnice Lima, MD;  Location: [facility];  Service: General;;   PARTIAL COLECTOMY  11/24/2014   Procedure: RIGHT HEMI COLECTOMY;  Surgeon: Donnice Lima, MD;  Location: [facility];  Service: General;;   PERIPHERAL VASCULAR BALLOON ANGIOPLASTY  03/20/2022   Procedure: PERIPHERAL VASCULAR BALLOON ANGIOPLASTY;  Surgeon: Serene Gaile ORN, MD;  Location: [facility];  Service: Cardiovascular;;  AT and Peroneal   SMALL INTESTINE SURGERY     age 42  (for crohn's disease)   TONSILLECTOMY     teen    Social History:  Social History   Socioeconomic History   Marital status: Divorced    Spouse name: Not on file   Number of children: 2   Years of education: Not on file   Highest education level: 12th grade  Occupational History   Occupation: disabled  Tobacco Use   Smoking status: Every Day    Current packs/day: 0.75    Average packs/day: 0.8 packs/day for 48.0 years (36.0 ttl pk-yrs)    Types: Cigarettes    Passive exposure: Current   Smokeless tobacco: Never   Tobacco comments:    05-16-2022  per pt started smoking as a teen and trying to quit  Vaping Use   Vaping status: Never Used  Substance and Sexual Activity   Alcohol use: No   Drug use: Never   Sexual activity: Not on file  Other Topics Concern   Not on file  Social History Narrative   Not on file   Social Drivers of Health   Financial Resource Strain: Low Risk  (09/27/2023)   Overall Financial Resource Strain (CARDIA)     Difficulty of Paying Living Expenses: Not hard at all  Food Insecurity: No Food Insecurity (09/27/2023)   Hunger Vital Sign    Worried About Running Out of Food in the Last Year: Never true    Ran Out of Food in the Last Year: Never true  Transportation Needs: No Transportation Needs (09/27/2023)   PRAPARE - Administrator, Civil Service (Medical): No    Lack of Transportation (Non-Medical): No  Physical Activity: Sufficiently Active (09/27/2023)   Exercise Vital Sign    Days  of Exercise per Week: 5 days    Minutes of Exercise per Session: 30 min  Stress: No Stress Concern Present (09/27/2023)   [employer] of Occupational Health - Occupational Stress Questionnaire    Feeling of Stress : Not at all  Social Connections: Socially Isolated (09/27/2023)   Social Connection and Isolation Panel    Frequency of Communication with Friends and Family: Once a week    Frequency of Social Gatherings with Friends and Family: More than three times a week    Attends Religious Services: Never    Database administrator or Organizations: No    Attends Banker Meetings: Never    Marital Status: Divorced  Catering manager Violence: Not At Risk (09/27/2023)   Humiliation, Afraid, Rape, and Kick questionnaire    Fear of Current or Ex-Partner: No    Emotionally Abused: No    Physically Abused: No    Sexually Abused: No    Family History:  Family History  Problem Relation Age of Onset   Hypertension Mother    Ulcerative colitis Mother    Crohn's disease Mother    Heart disease Mother    Kidney disease Mother    Arthritis Father    Brain cancer Father    Diabetes Father    Alcohol abuse Paternal Uncle    Diabetes Paternal Grandmother    Stroke Paternal Grandmother    Heart failure Sister    Kidney disease Sister    Heart failure Brother    Other Sister        prediabetes   Kidney cancer Brother     Medications:   Current Outpatient Medications on File Prior to  Visit  Medication Sig Dispense Refill   albuterol  (PROAIR  HFA) 108 (90 Base) MCG/ACT inhaler TAKE 2 PUFFS BY MOUTH EVERY 6 HOURS AS NEEDED FOR WHEEZE OR SHORTNESS OF BREATH (Patient taking differently: Inhale 2 puffs into the lungs every 6 (six) hours as needed for shortness of breath or wheezing. TAKE 2 PUFFS BY MOUTH EVERY 6 HOURS AS NEEDED FOR WHEEZE OR SHORTNESS OF BREATH) 18 g 5   amLODipine  (NORVASC ) 10 MG tablet TAKE 1 TABLET BY MOUTH DAILY 100 tablet 0   budesonide  (ENTOCORT EC ) 3 MG 24 hr capsule Take 3 capsules (9 mg total) by mouth daily. 270 capsule 3   buPROPion  (WELLBUTRIN  XL) 150 MG 24 hr tablet TAKE 1 TABLET BY MOUTH DAILY 100 tablet 2   Cholecalciferol  (VITAMIN D3) 1.25 MG (50000 UT) capsule TAKE 1 CAPSULE BY MOUTH ONE TIME PER WEEK 12 capsule 0   clindamycin (CLEOCIN T) 1 % lotion      clonazePAM  (KLONOPIN ) 0.5 MG tablet Take 1 tablet (0.5 mg total) by mouth 2 (two) times daily as needed for anxiety. 60 tablet 3   clopidogrel  (PLAVIX ) 75 MG tablet TAKE 1 TABLET BY MOUTH EVERY DAY 90 tablet 3   [employer] PAIN RELIEF EXTRA STRENGTH 500 MG tablet TAKE 1 TABLET BY MOUTH EVERY 8 HOURS AS NEEDED FOR MODERATE PAIN (Patient taking differently: Take 500-1,000 mg by mouth every 8 (eight) hours as needed for moderate pain (pain score 4-6).) 60 tablet 5   Cyanocobalamin  (B-12 COMPLIANCE INJECTION) 1000 MCG/ML KIT Inject as directed every 30 (thirty) days.     ezetimibe  (ZETIA ) 10 MG tablet Take 1 tablet (10 mg total) by mouth daily. In order to get a 1year supply, pt must schedule appt with provider - 3rd attempt 15 tablet 0   fluticasone  (FLONASE ) 50 MCG/ACT  nasal spray Place 2 sprays into both nostrils daily. 48 g 3   lamoTRIgine  (LAMICTAL ) 200 MG tablet TAKE 1 TABLET BY MOUTH AT  BEDTIME 100 tablet 2   Potassium Citrate  15 MEQ (1620 MG) TBCR Take 1 tablet by mouth in the morning, at noon, and at bedtime. Schedule an appt for further refills 90 tablet 0   spironolactone  (ALDACTONE ) 100 MG tablet  TAKE 1 TABLET BY MOUTH DAILY 80 tablet 3   STIOLTO RESPIMAT  2.5-2.5 MCG/ACT AERS USE 2 INHALATIONS BY MOUTH  DAILY (Patient taking differently: as needed.) 12 g 1   triamcinolone  cream (KENALOG) 0.1 % Apply 1 Application topically as needed.     No current facility-administered medications on file prior to visit.    Allergies:   Allergies  Allergen Reactions   Azathioprine Other (See Comments)    Side effects to pancrease      Crestor  [Rosuvastatin  Calcium ] Other (See Comments)    Muscle and joint aches   Lipitor [Atorvastatin  Calcium ] Other (See Comments)    Muscle cramps   Semaglutide  Other (See Comments)    Constipation    Nsaids Other (See Comments)    Crohn's disease = NO  NSIADs   Lisinopril      Uncontrollable head shaking       OBJECTIVE:  Physical Exam  There were no vitals filed for this visit.   There is no height or weight on file to calculate BMI. No results found.   General: well developed, well nourished, very pleasant middle-aged African-American female, seated, in no evident distress Head: head normocephalic and atraumatic.   Neck: supple with no carotid or supraclavicular bruits Cardiovascular: regular rate and rhythm, no murmurs Musculoskeletal: no deformity Skin:  no rash/petichiae Vascular:  Normal pulses all extremities   Neurologic Exam Mental Status: Awake and fully alert. Oriented to place and time. Recent and remote memory intact. Attention span, concentration and fund of knowledge appropriate. Mood and affect appropriate.  Cranial Nerves: Pupils equal, briskly reactive to light. Extraocular movements full without nystagmus. Visual fields full to confrontation. Hearing intact. Facial sensation intact. Face, tongue, palate moves normally and symmetrically.  Motor: Normal bulk and tone. Normal strength in all tested extremity muscles Sensory.: intact to touch , pinprick , position and vibratory sensation.  Coordination: Rapid alternating  movements normal in all extremities. Finger-to-nose and heel-to-shin performed accurately bilaterally. Gait and Station: Arises from chair without difficulty. Stance is normal. Gait demonstrates normal stride length and balance without use of AD. Tandem walk and heel toe without difficulty.  Reflexes: 1+ and symmetric. Toes downgoing.         ASSESSMENT/PLAN: April May is a 68 y.o. year old female     OSA on CPAP :  Compliance report shows satisfactory usage with optimal residual AHI.  Continue current pressure setting at 10 with EPR 3 Discussed continued nightly usage with ensuring greater than 4 hours nightly for optimal benefit and per insurance purposes.   DME [employer], continue to follow with DME company for any needed supplies or CPAP related concerns CPAP set up 07/2020     Follow up in 1 year or call earlier if needed   CC:  PCP: Joshua Debby CROME, MD    I personally spent a total of *** minutes in the care of the patient today including {Time Based Coding:210964241}.    Harlene Bogaert, AGNP-BC  [facility] [address] [place], [place] [address]  Phone [phone] Fax [phone] Note: This  document was prepared with digital dictation and possible smart phrase technology. Any transcriptional errors that result from this process are unintentional.

## 2024-01-21 ENCOUNTER — Ambulatory Visit: Payer: 59 | Admitting: Adult Health

## 2024-01-30 ENCOUNTER — Encounter: Payer: Self-pay | Admitting: Internal Medicine

## 2024-02-04 ENCOUNTER — Other Ambulatory Visit: Payer: Self-pay

## 2024-02-04 ENCOUNTER — Encounter: Payer: Self-pay | Admitting: Internal Medicine

## 2024-02-04 ENCOUNTER — Telehealth: Payer: Self-pay

## 2024-02-04 ENCOUNTER — Ambulatory Visit: Payer: Self-pay | Admitting: *Deleted

## 2024-02-04 MED ORDER — SKYRIZI 360 MG/2.4ML ~~LOC~~ SOCT: SUBCUTANEOUS | Status: AC

## 2024-02-04 NOTE — Telephone Encounter (Signed)
 Copied from CRM #[id number]. Topic: Clinical - Red Word Triage >> Feb 04, 2024 11:18 AM Frederich PARAS wrote: [facility] that prompted transfer to Nurse Triage: pain    she has a boil , doesnt want to drain,its painful when she sit on it, behind leg, getting bigger,  started 4 days ago, fluid in side of it, Reason for Disposition  [1] Boil > 1/2 inch across (> 12 mm; larger than a marble) AND [2] center is soft or pus colored  Answer Assessment - Initial Assessment Questions 1. APPEARANCE of BOIL: What does the boil look like?      I have a boil on the back of my thigh that has been there for 4 days.   Not draining.  I'm putting warm water on it. It's soft around it.  Fluid around it.  2. LOCATION: Where is the boil located?      It's on left thigh in the back. 3. NUMBER: How many boils are there?      One 4. SIZE: How big is the boil? (e.g., inches, cm; compare to size of a coin or other object)     I can't tell    It started small but is getting bigger.  It feels like it has water in it. 5. ONSET: When did the boil start?     4 days ago 6. PAIN: Is there any pain? If Yes, ask: How bad is the pain?   (Scale 1-10; or mild, moderate, severe)      Yes 7. FEVER: Do you have a fever? If Yes, ask: What is it, how was it measured, and when did it start?      Not asked  8. SOURCE: Have you been around anyone with boils or other Staph infections? Have you ever had boils before?     No 9. OTHER SYMPTOMS: Do you have any other symptoms? (e.g., shaking chills, weakness, rash elsewhere on body)     No 10. PREGNANCY: Is there any chance you are pregnant? When was your last menstrual period?       N/A  Protocols used: Boil (Skin Abscess)-A-AH FYI Only or Action Required?: FYI only for provider.  Patient was last seen in primary care on 12/11/2023 by Joshua Debby CROME, MD.  Called Nurse Triage reporting [place].Boil on back of thigh  Symptoms began several days ago.  Interventions  attempted: Ice/heat application.  Symptoms are: gradually worsening.  Triage Disposition: See Physician Within 24 Hours  Patient/caregiver understands and will follow disposition?: Yes

## 2024-02-04 NOTE — Telephone Encounter (Signed)
 Our office received a call from rep Ronal Guardian with [employer]  calling to ensure our office would be handling the PA for patient's on body injectors soon. Patient is currently receiving IV dose through [facility]. Advised her I had spoken with [name] with [facility] who will be handling the PA team. SQ dose added to patient's chart to reflect in medication list.

## 2024-02-04 NOTE — Progress Notes (Unsigned)
 Subjective:    Patient ID: April May, female    DOB: 01-Apr-1956, 68 y.o.   MRN: [id number]      HPI April May is here for No chief complaint on file.        Medications and allergies reviewed with patient and updated if appropriate.  Current Outpatient Medications on File Prior to Visit  Medication Sig Dispense Refill   albuterol  (PROAIR  HFA) 108 (90 Base) MCG/ACT inhaler TAKE 2 PUFFS BY MOUTH EVERY 6 HOURS AS NEEDED FOR WHEEZE OR SHORTNESS OF BREATH (Patient taking differently: Inhale 2 puffs into the lungs every 6 (six) hours as needed for shortness of breath or wheezing. TAKE 2 PUFFS BY MOUTH EVERY 6 HOURS AS NEEDED FOR WHEEZE OR SHORTNESS OF BREATH) 18 g 5   amLODipine  (NORVASC ) 10 MG tablet TAKE 1 TABLET BY MOUTH DAILY 100 tablet 0   budesonide  (ENTOCORT EC ) 3 MG 24 hr capsule Take 3 capsules (9 mg total) by mouth daily. 270 capsule 3   buPROPion  (WELLBUTRIN  XL) 150 MG 24 hr tablet TAKE 1 TABLET BY MOUTH DAILY 100 tablet 2   Cholecalciferol  (VITAMIN D3) 1.25 MG (50000 UT) capsule TAKE 1 CAPSULE BY MOUTH ONE TIME PER WEEK 12 capsule 0   clindamycin (CLEOCIN T) 1 % lotion      clonazePAM  (KLONOPIN ) 0.5 MG tablet Take 1 tablet (0.5 mg total) by mouth 2 (two) times daily as needed for anxiety. 60 tablet 3   clopidogrel  (PLAVIX ) 75 MG tablet TAKE 1 TABLET BY MOUTH EVERY DAY 90 tablet 3   [employer]S PAIN RELIEF EXTRA STRENGTH 500 MG tablet TAKE 1 TABLET BY MOUTH EVERY 8 HOURS AS NEEDED FOR MODERATE PAIN (Patient taking differently: Take 500-1,000 mg by mouth every 8 (eight) hours as needed for moderate pain (pain score 4-6).) 60 tablet 5   Cyanocobalamin  (B-12 COMPLIANCE INJECTION) 1000 MCG/ML KIT Inject as directed every 30 (thirty) days.     ezetimibe  (ZETIA ) 10 MG tablet Take 1 tablet (10 mg total) by mouth daily. In order to get a 1year supply, pt must schedule appt with provider - 3rd attempt 15 tablet 0   fluticasone  (FLONASE ) 50 MCG/ACT nasal spray Place 2 sprays into  both nostrils daily. 48 g 3   lamoTRIgine  (LAMICTAL ) 200 MG tablet TAKE 1 TABLET BY MOUTH AT  BEDTIME 100 tablet 2   Potassium Citrate  15 MEQ (1620 MG) TBCR Take 1 tablet by mouth in the morning, at noon, and at bedtime. Schedule an appt for further refills 90 tablet 0   Risankizumab -rzaa (SKYRIZI ) 360 MG/2.4ML SOCT Inject 360 mg into the skin once for 1 dose at week 12. Then once every 8 weeks.     spironolactone  (ALDACTONE ) 100 MG tablet TAKE 1 TABLET BY MOUTH DAILY 80 tablet 3   STIOLTO RESPIMAT  2.5-2.5 MCG/ACT AERS USE 2 INHALATIONS BY MOUTH  DAILY (Patient taking differently: as needed.) 12 g 1   triamcinolone  cream (KENALOG) 0.1 % Apply 1 Application topically as needed.     No current facility-administered medications on file prior to visit.    Review of Systems     Objective:  There were no vitals filed for this visit. BP Readings from Last 3 Encounters:  12/11/23 138/86  12/09/23 (!) 170/70  09/16/23 (!) 140/84   Wt Readings from Last 3 Encounters:  12/11/23 215 lb (97.5 kg)  12/09/23 214 lb 8 oz (97.3 kg)  09/27/23 217 lb (98.4 kg)   There is no height or weight on  file to calculate BMI.    Physical Exam         Assessment & Plan:    See Problem List for Assessment and Plan of chronic medical problems.

## 2024-02-05 ENCOUNTER — Ambulatory Visit (INDEPENDENT_AMBULATORY_CARE_PROVIDER_SITE_OTHER): Admitting: Internal Medicine

## 2024-02-05 VITALS — BP 132/84 | HR 55 | Temp 98.5°F | Ht 61.42 in | Wt 220.0 lb

## 2024-02-05 DIAGNOSIS — L02416 Cutaneous abscess of left lower limb: Secondary | ICD-10-CM | POA: Diagnosis not present

## 2024-02-05 DIAGNOSIS — L03116 Cellulitis of left lower limb: Secondary | ICD-10-CM | POA: Diagnosis not present

## 2024-02-05 MED ORDER — DOXYCYCLINE HYCLATE 100 MG PO TABS: ORAL_TABLET | ORAL | 0 refills | Status: AC

## 2024-02-05 MED ORDER — FLUCONAZOLE 150 MG PO TABS: 150.0000 mg | ORAL_TABLET | ORAL | 0 refills | Status: AC

## 2024-02-05 NOTE — Patient Instructions (Addendum)
        Medications changes include :   doxycycline  and fluconazole    Do warm compresses, change the bandage daily and more often if needed    Return if symptoms worsen or fail to improve.

## 2024-02-06 DIAGNOSIS — K50011 Crohn's disease of small intestine with rectal bleeding: Secondary | ICD-10-CM | POA: Diagnosis not present

## 2024-02-06 DIAGNOSIS — K50018 Crohn's disease of small intestine with other complication: Secondary | ICD-10-CM | POA: Diagnosis not present

## 2024-02-07 ENCOUNTER — Ambulatory Visit: Admitting: Podiatry

## 2024-02-07 ENCOUNTER — Other Ambulatory Visit: Payer: Self-pay | Admitting: Internal Medicine

## 2024-02-07 NOTE — Telephone Encounter (Signed)
She is overdue for an A1C

## 2024-02-13 ENCOUNTER — Telehealth: Payer: Self-pay

## 2024-02-13 NOTE — Telephone Encounter (Signed)
 She is overdue for an A1c

## 2024-02-13 NOTE — Telephone Encounter (Signed)
 Copied from CRM [id number]. Topic: Appointments - Scheduling Inquiry for Clinic >> Feb 13, 2024  1:20 PM Zy'onna H wrote: Reason for CRM:  Patient asking why does she need to schedule a ' Office Visit' to complete her A1C testing, why can't she go to the Lab?   Patient stated that she received a call from the nurse of - Joshua Debby CROME, MD stating she needed to schedule an appointment to have A1C Testing administered.   **PCP/PCP Team Please Advise** >> Feb 13, 2024  3:20 PM Franky GRADE wrote: Patient is calling to follow up on a request for just A1C labs, she states she does not need to see Dr.Jones, she just wants her labs checked.

## 2024-02-14 NOTE — Telephone Encounter (Signed)
 Can you please place the lab for this patient ?

## 2024-02-18 ENCOUNTER — Encounter: Payer: Self-pay | Admitting: Internal Medicine

## 2024-02-20 ENCOUNTER — Other Ambulatory Visit: Payer: Self-pay | Admitting: Internal Medicine

## 2024-02-20 DIAGNOSIS — Z1231 Encounter for screening mammogram for malignant neoplasm of breast: Secondary | ICD-10-CM

## 2024-02-24 ENCOUNTER — Encounter: Payer: Self-pay | Admitting: Internal Medicine

## 2024-02-24 NOTE — Telephone Encounter (Signed)
 Copied from CRM [id number]. Topic: Clinical - Request for Lab/Test Order >> Feb 24, 2024  9:58 AM April May wrote: Reason for CRM: Reqested last week to have lab order for blood placed without coming in to see Dr. Joshua. Following up on that request as she has not received a message back.

## 2024-02-25 NOTE — Telephone Encounter (Signed)
 Yes, she is overdue for an a1c

## 2024-02-26 ENCOUNTER — Encounter: Payer: Self-pay | Admitting: Adult Health

## 2024-02-26 NOTE — Telephone Encounter (Signed)
 Patient has been scheduled

## 2024-02-27 ENCOUNTER — Ambulatory Visit: Admitting: Internal Medicine

## 2024-02-27 ENCOUNTER — Encounter: Payer: Self-pay | Admitting: Internal Medicine

## 2024-02-27 VITALS — BP 136/78 | HR 65 | Temp 98.8°F | Resp 16 | Ht 61.42 in | Wt 223.0 lb

## 2024-02-27 DIAGNOSIS — I10 Essential (primary) hypertension: Secondary | ICD-10-CM

## 2024-02-27 DIAGNOSIS — B370 Candidal stomatitis: Secondary | ICD-10-CM

## 2024-02-27 DIAGNOSIS — R7303 Prediabetes: Secondary | ICD-10-CM

## 2024-02-27 DIAGNOSIS — K5010 Crohn's disease of large intestine without complications: Secondary | ICD-10-CM

## 2024-02-27 DIAGNOSIS — Z23 Encounter for immunization: Secondary | ICD-10-CM

## 2024-02-27 DIAGNOSIS — R197 Diarrhea, unspecified: Secondary | ICD-10-CM | POA: Insufficient documentation

## 2024-02-27 DIAGNOSIS — E538 Deficiency of other specified B group vitamins: Secondary | ICD-10-CM

## 2024-02-27 DIAGNOSIS — R1310 Dysphagia, unspecified: Secondary | ICD-10-CM | POA: Insufficient documentation

## 2024-02-27 DIAGNOSIS — K5090 Crohn's disease, unspecified, without complications: Secondary | ICD-10-CM | POA: Insufficient documentation

## 2024-02-27 LAB — CBC WITH DIFFERENTIAL/PLATELET
Basophils Absolute: 0 K/uL (ref 0.0–0.1)
Basophils Relative: 0.3 % (ref 0.0–3.0)
Eosinophils Absolute: 0.1 K/uL (ref 0.0–0.7)
Eosinophils Relative: 0.6 % (ref 0.0–5.0)
HCT: 45.2 % (ref 36.0–46.0)
Hemoglobin: 14.6 g/dL (ref 12.0–15.0)
Lymphocytes Relative: 24.2 % (ref 12.0–46.0)
Lymphs Abs: 3.2 K/uL (ref 0.7–4.0)
MCHC: 32.4 g/dL (ref 30.0–36.0)
MCV: 94.9 fl (ref 78.0–100.0)
Monocytes Absolute: 1.1 K/uL — ABNORMAL HIGH (ref 0.1–1.0)
Monocytes Relative: 8.8 % (ref 3.0–12.0)
Neutro Abs: 8.6 K/uL — ABNORMAL HIGH (ref 1.4–7.7)
Neutrophils Relative %: 66.1 % (ref 43.0–77.0)
Platelets: 296 K/uL (ref 150.0–400.0)
RBC: 4.76 Mil/uL (ref 3.87–5.11)
RDW: 14.7 % (ref 11.5–15.5)
WBC: 13.1 K/uL — ABNORMAL HIGH (ref 4.0–10.5)

## 2024-02-27 LAB — VITAMIN B12: Vitamin B-12: 216 pg/mL (ref 211–911)

## 2024-02-27 LAB — FOLATE: Folate: 8.6 ng/mL (ref >5.9)

## 2024-02-27 LAB — BASIC METABOLIC PANEL WITH GFR
BUN: 11 mg/dL (ref 6–23)
CO2: 26 meq/L (ref 19–32)
Calcium: 9.7 mg/dL (ref 8.4–10.5)
Chloride: 105 meq/L (ref 96–112)
Creatinine, Ser: 0.82 mg/dL (ref 0.40–1.20)
GFR: 73.54 mL/min (ref >60.00)
Glucose, Bld: 90 mg/dL (ref 70–99)
Potassium: 3.8 meq/L (ref 3.5–5.1)
Sodium: 141 meq/L (ref 135–145)

## 2024-02-27 LAB — HEMOGLOBIN A1C: Hgb A1c MFr Bld: 6 % (ref 4.6–6.5)

## 2024-02-27 MED ORDER — CLOTRIMAZOLE 10 MG MT TROC: 10.0000 mg | Freq: Every day | OROMUCOSAL | 1 refills | Status: DC

## 2024-02-27 MED ORDER — RYBELSUS 3 MG PO TABS: 3.0000 mg | ORAL_TABLET | Freq: Every day | ORAL | 0 refills | Status: DC

## 2024-02-27 MED ORDER — COVID-19 MRNA VAC-TRIS(PFIZER) 30 MCG/0.3ML IM SUSY: 0.3000 mL | PREFILLED_SYRINGE | Freq: Once | INTRAMUSCULAR | 0 refills | Status: AC

## 2024-02-27 NOTE — Progress Notes (Signed)
 Subjective:  Patient ID: April May, female    DOB: June 22, 1955  Age: 68 y.o. MRN: [id number]  CC: Medical Management of Chronic Issues (Patient requesting blood work ), Fungus (Patient thinks she has fungus on her tongue. Its a yellowish/ white looking spot. ), and Medication Problem (Patient wants to know can she be started on rybelsus  or Ozempic  again )   HPI April May presents for f/up ---  Discussed the use of AI scribe software for clinical note transcription with the patient, who gave verbal consent to proceed.  History of Present Illness Anjenette Gerbino is a 68 year old female who presents with a white coating on her tongue.  She has a white coating on her tongue that she finds bothersome. She has experienced this before and found relief with a liquid mouthwash. She has not used any treatment for this in about three weeks, after taking two pills prescribed by another doctor, which did not help.  She has a history of Crohn's disease but currently has no symptoms such as abdominal pain, blood in the stool, constipation, diarrhea, loss of appetite, or weight loss. She mentions a previous change in bowel habits that concerned her, but she was advised to use over-the-counter remedies as needed. She is currently on Skyrizi  infusions, which she believes have increased her appetite.  She has a history of recurrent boils. A recent boil was treated with antibiotics and a minor procedure to drain it, and it has since resolved.  No coughing, wheezing, headache, or significant blurred vision, although she notes occasional mild blurred vision. She reports increased sleep, which she attributes to age.  She recalls receiving a pneumonia vaccine recently and is unsure about her flu vaccination status. She has not received the latest COVID vaccine due to apprehension.     Outpatient Medications Prior to Visit  Medication Sig Dispense Refill   albuterol   (PROAIR  HFA) 108 (90 Base) MCG/ACT inhaler TAKE 2 PUFFS BY MOUTH EVERY 6 HOURS AS NEEDED FOR WHEEZE OR SHORTNESS OF BREATH (Patient taking differently: Inhale 2 puffs into the lungs every 6 (six) hours as needed for shortness of breath or wheezing. TAKE 2 PUFFS BY MOUTH EVERY 6 HOURS AS NEEDED FOR WHEEZE OR SHORTNESS OF BREATH) 18 g 5   amLODipine  (NORVASC ) 10 MG tablet TAKE 1 TABLET BY MOUTH DAILY 100 tablet 0   budesonide  (ENTOCORT EC ) 3 MG 24 hr capsule Take 3 capsules (9 mg total) by mouth daily. 270 capsule 3   buPROPion  (WELLBUTRIN  XL) 150 MG 24 hr tablet TAKE 1 TABLET BY MOUTH DAILY 100 tablet 2   Cholecalciferol  (VITAMIN D3) 1.25 MG (50000 UT) capsule TAKE 1 CAPSULE BY MOUTH ONE TIME PER WEEK 12 capsule 0   clindamycin (CLEOCIN T) 1 % lotion      clonazePAM  (KLONOPIN ) 0.5 MG tablet Take 1 tablet (0.5 mg total) by mouth 2 (two) times daily as needed for anxiety. 60 tablet 3   clopidogrel  (PLAVIX ) 75 MG tablet TAKE 1 TABLET BY MOUTH EVERY DAY 90 tablet 3   [employer] PAIN RELIEF EXTRA STRENGTH 500 MG tablet TAKE 1 TABLET BY MOUTH EVERY 8 HOURS AS NEEDED FOR MODERATE PAIN (Patient taking differently: Take 500-1,000 mg by mouth every 8 (eight) hours as needed for moderate pain (pain score 4-6).) 60 tablet 5   Cyanocobalamin  (B-12 COMPLIANCE INJECTION) 1000 MCG/ML KIT Inject as directed every 30 (thirty) days.     ezetimibe  (ZETIA ) 10 MG tablet Take 1 tablet (10 mg  total) by mouth daily. In order to get a 1year supply, pt must schedule appt with provider - 3rd attempt 15 tablet 0   fluticasone  (FLONASE ) 50 MCG/ACT nasal spray Place 2 sprays into both nostrils daily. 48 g 3   lamoTRIgine  (LAMICTAL ) 200 MG tablet TAKE 1 TABLET BY MOUTH AT  BEDTIME 100 tablet 2   Potassium Citrate  15 MEQ (1620 MG) TBCR Take 1 tablet by mouth in the morning, at noon, and at bedtime. Schedule an appt for further refills 90 tablet 0   Risankizumab -rzaa (SKYRIZI ) 360 MG/2.4ML SOCT Inject 360 mg into the skin once for 1 dose at  week 12. Then once every 8 weeks.     spironolactone  (ALDACTONE ) 100 MG tablet TAKE 1 TABLET BY MOUTH DAILY 80 tablet 3   STIOLTO RESPIMAT  2.5-2.5 MCG/ACT AERS USE 2 INHALATIONS BY MOUTH  DAILY (Patient taking differently: as needed.) 12 g 1   triamcinolone  cream (KENALOG) 0.1 % Apply 1 Application topically as needed.     No facility-administered medications prior to visit.    ROS Review of Systems  Constitutional:  Negative for appetite change, chills, diaphoresis, fatigue and fever.  HENT: Negative.    Eyes: Negative.   Respiratory: Negative.  Negative for cough, chest tightness, shortness of breath and wheezing.   Cardiovascular:  Negative for chest pain, palpitations and leg swelling.  Gastrointestinal: Negative.  Negative for abdominal pain, blood in stool, constipation, diarrhea, nausea and vomiting.  Genitourinary: Negative.  Negative for difficulty urinating.  Musculoskeletal: Negative.  Negative for arthralgias and myalgias.  Skin: Negative.   Neurological:  Negative for dizziness, weakness, numbness and headaches.  Hematological:  Negative for adenopathy. Does not bruise/bleed easily.  Psychiatric/Behavioral: Negative.      Objective:  BP 136/78 (BP Location: Left Arm, Patient Position: Sitting, Cuff Size: Normal)   Pulse 65   Temp 98.8 F (37.1 C) (Oral)   Resp 16   Ht 5' 1.42 (1.56 m)   Wt 223 lb (101.2 kg)   LMP 05/14/1988   SpO2 97%   BMI 41.56 kg/m   BP Readings from Last 3 Encounters:  02/27/24 136/78  02/05/24 132/84  12/11/23 138/86    Wt Readings from Last 3 Encounters:  02/27/24 223 lb (101.2 kg)  02/05/24 220 lb (99.8 kg)  12/11/23 215 lb (97.5 kg)    Physical Exam Vitals reviewed.  Constitutional:      Appearance: Normal appearance.  HENT:     Mouth/Throat:     Mouth: Mucous membranes are moist.     Tongue: No lesions.     Comments: Tongue is coated white Eyes:     General: No scleral icterus.    Conjunctiva/sclera: Conjunctivae  normal.  Cardiovascular:     Rate and Rhythm: Normal rate and regular rhythm.     Heart sounds: No murmur heard.    No friction rub. No gallop.  Pulmonary:     Effort: No respiratory distress.     Breath sounds: No stridor. Rhonchi present. No wheezing or rales.  Chest:     Chest wall: No tenderness.  Abdominal:     General: Abdomen is protuberant. Bowel sounds are normal.     Palpations: There is no hepatomegaly, splenomegaly or mass.     Tenderness: There is no abdominal tenderness.  Musculoskeletal:        General: Normal range of motion.     Cervical back: Neck supple.  Lymphadenopathy:     Cervical: No cervical adenopathy.  Skin:  General: Skin is warm and dry.     Findings: No rash.  Neurological:     General: No focal deficit present.     Mental Status: She is alert.  Psychiatric:        Mood and Affect: Mood normal.        Behavior: Behavior normal.     Lab Results  Component Value Date   WBC 13.1 (H) 02/27/2024   HGB 14.6 02/27/2024   HCT 45.2 02/27/2024   PLT 296.0 02/27/2024   GLUCOSE 90 02/27/2024   CHOL 125 12/11/2023   TRIG 90.0 12/11/2023   HDL 54.90 12/11/2023   LDLCALC 52 12/11/2023   ALT 19 12/11/2023   AST 18 12/11/2023   NA 141 02/27/2024   K 3.8 02/27/2024   CL 105 02/27/2024   CREATININE 0.82 02/27/2024   BUN 11 02/27/2024   CO2 26 02/27/2024   TSH 1.58 12/11/2023   HGBA1C 6.0 02/27/2024   MICROALBUR 1.1 08/07/2023    CT CHEST LUNG CA SCREEN LOW DOSE W/O CM Result Date: 10/30/2023 CLINICAL DATA:  68 year old female current smoker with greater than 20 pack-year smoking history EXAM: CT CHEST WITHOUT CONTRAST LOW-DOSE FOR LUNG CANCER SCREENING TECHNIQUE: Multidetector CT imaging of the chest was performed following the standard protocol without IV contrast. RADIATION DOSE REDUCTION: This exam was performed according to the departmental dose-optimization program which includes automated exposure control, adjustment of the mA and/or kV  according to patient size and/or use of iterative reconstruction technique. COMPARISON:  09/14/2022 screening chest CT and 09/27/2022 chest CT angiogram FINDINGS: Cardiovascular: Normal heart size. No significant pericardial effusion/thickening. Three-vessel coronary atherosclerosis. Atherosclerotic nonaneurysmal thoracic aorta. Normal caliber pulmonary arteries. Mediastinum/Nodes: No significant thyroid  nodules. Unremarkable esophagus. No pathologically enlarged axillary, mediastinal or hilar lymph nodes, noting limited sensitivity for the detection of hilar adenopathy on this noncontrast study. Lungs/Pleura: No pneumothorax. No pleural effusion. Mild centrilobular emphysema with diffuse bronchial wall thickening. No acute consolidative airspace disease or lung masses. Stable solid 3.9 mm medial left upper lobe nodule on series 4/image 119. No new significant pulmonary nodules. Upper abdomen: Small hiatal hernia.  Diffuse hepatic steatosis. Musculoskeletal: No aggressive appearing focal osseous lesions. Moderate thoracic spondylosis. IMPRESSION: 1. Lung-RADS 2, benign appearance or behavior. Continue annual screening with low-dose chest CT without contrast in 12 months. 2. Three-vessel coronary atherosclerosis. 3. Small hiatal hernia. 4. Diffuse hepatic steatosis. 5. Aortic Atherosclerosis (ICD10-I70.0) and Emphysema (ICD10-J43.9). Electronically Signed   By: Selinda DELENA Blue M.D.   On: 10/30/2023 23:46   CT ENTERO ABD/PELVIS W CONTAST Result Date: 10/16/2023 EXAMINATION: CT ENTERO ABDOMEN PELVIS W CONTRAST CLINICAL INDICATION: Female, 68 years old. Crohn's disease TECHNIQUE: CT of the abdomen and pelvis with 100 cc Omnipaque  300 intravenous contrast. 0.5 mg dose of intravenous glucagon was administered prior to imaging. Volumen was used as a negative oral contrast material. Multiplanar reconstructions were generated. Unless otherwise specified, incidental thyroid , adrenal, renal lesions do not require dedicated  imaging follow up. Additionally, any mentioned pulmonary nodules do not require dedicated imaging follow-up based on the Fleischner guidelines unless otherwise specified. Coronary calcifications are not identified unless otherwise specified. COMPARISON: 07/27/2016, 04/04/2022 FINDINGS: The lung bases are clear. The heart is normal in size. Small hiatal hernia. There is hepatic steatosis. There is cholelithiasis. Spleen is normal. Pancreas is normal. The adrenals are normal. The kidneys are normal. The abdominal aorta is normal in caliber. Scattered atherosclerotic changes are present. The bladder is normal. The uterus is surgically absent. Right hemicolectomy noted. Large  and small bowel loops are otherwise within normal limits though there may be a sinus tract extending from the anus to the right aspect of the gluteal cleft which is incompletely imaged. There are mild degenerative changes of the spine. IMPRESSION: Status post right hemicolectomy with possible sinus tract seen extending from the anus to the right aspect of the gluteal cleft which is incompletely imaged. No evidence for active inflammatory bowel disease. DOSE REDUCTION: This exam was performed according to our departmental dose-optimization program which includes automated exposure control, adjustment of the mA and/or kV according to patient size and/or use of iterative reconstruction technique. Electronically signed by: [place] Engel MD 10/16/2023 06:33 PM EDT RP Workstation: [id number]    Assessment & Plan:  Prediabetes -     Hemoglobin A1c; Future -     Basic metabolic panel with GFR; Future -     Rybelsus ; Take 1 tablet (3 mg total) by mouth daily.  Dispense: 30 tablet; Refill: 0  Vitamin B12 deficiency -     CBC with Differential/Platelet; Future -     Vitamin B12; Future -     Folate; Future  Malignant hypertension- BP is well controlled. -     Basic metabolic panel with GFR; Future  Candida, oral -     Clotrimazole ; Take 1  tablet (10 mg total) by mouth 5 (five) times daily.  Dispense: 70 Troche; Refill: 1  Need for immunization against influenza -     Flu vaccine HIGH DOSE PF(Fluzone Trivalent)  Crohn's disease of large intestine without complication (HCC)- Doing well on Skyrizi .  Other orders -     COVID-19 mRNA Vac-TriS(Pfizer); Inject 0.3 mLs into the muscle once for 1 dose.  Dispense: 0.3 mL; Refill: 0     Follow-up: Return in about 4 months (around 06/29/2024).  Debby Molt, MD

## 2024-02-27 NOTE — Patient Instructions (Signed)
 Hypertension, Adult High blood pressure (hypertension) is when the force of blood pumping through the arteries is too strong. The arteries are the blood vessels that carry blood from the heart throughout the body. Hypertension forces the heart to work harder to pump blood and may cause arteries to become narrow or stiff. Untreated or uncontrolled hypertension can lead to a heart attack, heart failure, a stroke, kidney disease, and other problems. A blood pressure reading consists of a higher number over a lower number. Ideally, your blood pressure should be below 120/80. The first ("top") number is called the systolic pressure. It is a measure of the pressure in your arteries as your heart beats. The second ("bottom") number is called the diastolic pressure. It is a measure of the pressure in your arteries as the heart relaxes. What are the causes? The exact cause of this condition is not known. There are some conditions that result in high blood pressure. What increases the risk? Certain factors may make you more likely to develop high blood pressure. Some of these risk factors are under your control, including: Smoking. Not getting enough exercise or physical activity. Being overweight. Having too much fat, sugar, calories, or salt (sodium) in your diet. Drinking too much alcohol. Other risk factors include: Having a personal history of heart disease, diabetes, high cholesterol, or kidney disease. Stress. Having a family history of high blood pressure and high cholesterol. Having obstructive sleep apnea. Age. The risk increases with age. What are the signs or symptoms? High blood pressure may not cause symptoms. Very high blood pressure (hypertensive crisis) may cause: Headache. Fast or irregular heartbeats (palpitations). Shortness of breath. Nosebleed. Nausea and vomiting. Vision changes. Severe chest pain, dizziness, and seizures. How is this diagnosed? This condition is diagnosed by  measuring your blood pressure while you are seated, with your arm resting on a flat surface, your legs uncrossed, and your feet flat on the floor. The cuff of the blood pressure monitor will be placed directly against the skin of your upper arm at the level of your heart. Blood pressure should be measured at least twice using the same arm. Certain conditions can cause a difference in blood pressure between your right and left arms. If you have a high blood pressure reading during one visit or you have normal blood pressure with other risk factors, you may be asked to: Return on a different day to have your blood pressure checked again. Monitor your blood pressure at home for 1 week or longer. If you are diagnosed with hypertension, you may have other blood or imaging tests to help your health care provider understand your overall risk for other conditions. How is this treated? This condition is treated by making healthy lifestyle changes, such as eating healthy foods, exercising more, and reducing your alcohol intake. You may be referred for counseling on a healthy diet and physical activity. Your health care provider may prescribe medicine if lifestyle changes are not enough to get your blood pressure under control and if: Your systolic blood pressure is above 130. Your diastolic blood pressure is above 80. Your personal target blood pressure may vary depending on your medical conditions, your age, and other factors. Follow these instructions at home: Eating and drinking  Eat a diet that is high in fiber and potassium, and low in sodium, added sugar, and fat. An example of this eating plan is called the DASH diet. DASH stands for Dietary Approaches to Stop Hypertension. To eat this way: Eat  plenty of fresh fruits and vegetables. Try to fill one half of your plate at each meal with fruits and vegetables. Eat whole grains, such as whole-wheat pasta, brown rice, or whole-grain bread. Fill about one  fourth of your plate with whole grains. Eat or drink low-fat dairy products, such as skim milk or low-fat yogurt. Avoid fatty cuts of meat, processed or cured meats, and poultry with skin. Fill about one fourth of your plate with lean proteins, such as fish, chicken without skin, beans, eggs, or tofu. Avoid pre-made and processed foods. These tend to be higher in sodium, added sugar, and fat. Reduce your daily sodium intake. Many people with hypertension should eat less than 1,500 mg of sodium a day. Do not drink alcohol if: Your health care provider tells you not to drink. You are pregnant, may be pregnant, or are planning to become pregnant. If you drink alcohol: Limit how much you have to: 0-1 drink a day for women. 0-2 drinks a day for men. Know how much alcohol is in your drink. In the [place] one drink equals one 12 oz bottle of beer (355 mL), one 5 oz glass of wine (148 mL), or one 1 oz glass of hard liquor (44 mL). Lifestyle  Work with your health care provider to maintain a healthy body weight or to lose weight. Ask what an ideal weight is for you. Get at least 30 minutes of exercise that causes your heart to beat faster (aerobic exercise) most days of the week. Activities may include walking, swimming, or biking. Include exercise to strengthen your muscles (resistance exercise), such as Pilates or lifting weights, as part of your weekly exercise routine. Try to do these types of exercises for 30 minutes at least 3 days a week. Do not use any products that contain nicotine or tobacco. These products include cigarettes, chewing tobacco, and vaping devices, such as e-cigarettes. If you need help quitting, ask your health care provider. Monitor your blood pressure at home as told by your health care provider. Keep all follow-up visits. This is important. Medicines Take over-the-counter and prescription medicines only as told by your health care provider. Follow directions carefully. Blood  pressure medicines must be taken as prescribed. Do not skip doses of blood pressure medicine. Doing this puts you at risk for problems and can make the medicine less effective. Ask your health care provider about side effects or reactions to medicines that you should watch for. Contact a health care provider if you: Think you are having a reaction to a medicine you are taking. Have headaches that keep coming back (recurring). Feel dizzy. Have swelling in your ankles. Have trouble with your vision. Get help right away if you: Develop a severe headache or confusion. Have unusual weakness or numbness. Feel faint. Have severe pain in your chest or abdomen. Vomit repeatedly. Have trouble breathing. These symptoms may be an emergency. Get help right away. Call 911. Do not wait to see if the symptoms will go away. Do not drive yourself to the hospital. Summary Hypertension is when the force of blood pumping through your arteries is too strong. If this condition is not controlled, it may put you at risk for serious complications. Your personal target blood pressure may vary depending on your medical conditions, your age, and other factors. For most people, a normal blood pressure is less than 120/80. Hypertension is treated with lifestyle changes, medicines, or a combination of both. Lifestyle changes include losing weight, eating a healthy,  low-sodium diet, exercising more, and limiting alcohol. This information is not intended to replace advice given to you by your health care provider. Make sure you discuss any questions you have with your health care provider. Document Revised: 03/07/2021 Document Reviewed: 03/07/2021 Elsevier Patient Education  2024 [employer].

## 2024-02-28 ENCOUNTER — Ambulatory Visit: Admitting: Podiatry

## 2024-02-28 ENCOUNTER — Ambulatory Visit: Payer: Self-pay | Admitting: Internal Medicine

## 2024-02-28 DIAGNOSIS — B351 Tinea unguium: Secondary | ICD-10-CM | POA: Diagnosis not present

## 2024-02-28 DIAGNOSIS — M79674 Pain in right toe(s): Secondary | ICD-10-CM | POA: Diagnosis not present

## 2024-02-28 DIAGNOSIS — M79675 Pain in left toe(s): Secondary | ICD-10-CM

## 2024-02-28 DIAGNOSIS — L853 Xerosis cutis: Secondary | ICD-10-CM

## 2024-02-28 NOTE — Progress Notes (Unsigned)
  Subjective:  Patient ID: April May, female    DOB: 1955/11/23,  MRN: [id number]  Chief Complaint  Patient presents with   Nail Problem    Nail trim    68 y.o. female returns for the above complaint.  Patient presents with thickened onychodystrophy mycotic toenails x 10 mild pain on palpation arch with ambulation worse with pressure she would like me debride she is ambulatory.  Patient would like to discuss dry skin.  He started over-the-counter option she states that has not helped she would like to discuss next treatment plan  Objective:  There were no vitals filed for this visit. Podiatric Exam: Vascular: dorsalis pedis and posterior tibial pulses are palpable bilateral. Capillary return is immediate. Temperature gradient is WNL. Skin turgor WNL  Sensorium: Normal Semmes Weinstein monofilament test. Normal tactile sensation bilaterally. Nail Exam: Pt has thick disfigured discolored nails with subungual debris noted bilateral entire nail hallux through fifth toenails.  Pain on palpation to the nails. Ulcer Exam: There is no evidence of ulcer or pre-ulcerative changes or infection. Orthopedic Exam: Muscle tone and strength are WNL. No limitations in general ROM. No crepitus or effusions noted.  Skin: No Porokeratosis. No infection or ulcers.  Xerosis of the skin noted no skin fissure noted.  No open wounds noted    Assessment & Plan:   1. Pain due to onychomycosis of toenails of both feet   2. Xerosis of skin     Patient was evaluated and treated and all questions answered.  Xerosis bilateral feet -I explained to the patient the etiology of xerosis and various treatment options were extensively discussed.  I explained to the patient the importance of maintaining moisturization of the skin with application of over-the-counter lotion such as Eucerin or Luciderm.  Given that patient has failed over-the-counter option patient will benefit from ammonium lactate ammonium  lactate was sent to the pharmacy   Onychomycosis with pain  -Nails palliatively debrided as below. -Educated on self-care  Procedure: Nail Debridement Rationale: pain  Type of Debridement: manual, sharp debridement. Instrumentation: Nail nipper, rotary burr. Number of Nails: 10  Procedures and Treatment: Consent by patient was obtained for treatment procedures. The patient understood the discussion of treatment and procedures well. All questions were answered thoroughly reviewed. Debridement of mycotic and hypertrophic toenails, 1 through 5 bilateral and clearing of subungual debris. No ulceration, no infection noted.  Return Visit-Office Procedure: Patient instructed to return to the office for a follow up visit 3 months for continued evaluation and treatment.  Franky Blanch, DPM    Return in about 3 months (around 05/30/2024).

## 2024-03-02 ENCOUNTER — Encounter: Payer: Self-pay | Admitting: Podiatry

## 2024-03-02 ENCOUNTER — Telehealth: Payer: Self-pay | Admitting: Lab

## 2024-03-02 NOTE — Telephone Encounter (Signed)
 Patient calling states no cream was called into pharmacy unable to see what is was office note note complete can you please advise.

## 2024-03-03 ENCOUNTER — Other Ambulatory Visit: Payer: Self-pay | Admitting: Podiatry

## 2024-03-03 MED ORDER — AMMONIUM LACTATE 12 % EX LOTN: 1.0000 | TOPICAL_LOTION | CUTANEOUS | 0 refills | Status: DC | PRN

## 2024-03-04 ENCOUNTER — Other Ambulatory Visit: Payer: Self-pay

## 2024-03-04 ENCOUNTER — Encounter: Payer: Self-pay | Admitting: Internal Medicine

## 2024-03-04 DIAGNOSIS — J4489 Other specified chronic obstructive pulmonary disease: Secondary | ICD-10-CM

## 2024-03-04 MED ORDER — STIOLTO RESPIMAT 2.5-2.5 MCG/ACT IN AERS: 2.0000 | INHALATION_SPRAY | Freq: Every day | RESPIRATORY_TRACT | 1 refills | Status: AC

## 2024-03-05 DIAGNOSIS — K50012 Crohn's disease of small intestine with intestinal obstruction: Secondary | ICD-10-CM | POA: Diagnosis not present

## 2024-03-05 MED ORDER — AMMONIUM LACTATE 12 % EX LOTN: 1.0000 | TOPICAL_LOTION | CUTANEOUS | 0 refills | Status: AC | PRN

## 2024-03-08 ENCOUNTER — Encounter: Payer: Self-pay | Admitting: Internal Medicine

## 2024-03-10 ENCOUNTER — Ambulatory Visit
Admission: RE | Admit: 2024-03-10 | Discharge: 2024-03-10 | Disposition: A | Source: Ambulatory Visit | Attending: Internal Medicine | Admitting: Internal Medicine

## 2024-03-10 DIAGNOSIS — Z1231 Encounter for screening mammogram for malignant neoplasm of breast: Secondary | ICD-10-CM

## 2024-03-12 ENCOUNTER — Ambulatory Visit: Admitting: Physician Assistant

## 2024-03-16 NOTE — Progress Notes (Unsigned)
 [facility] [address] [place]. [place] [address] [phone]       OFFICE FOLLOW UP NOTE  April May Date of Birth:  [birth year] Medical Record Number:  [id number]   Primary neurologist: Dr. Buck Reason for visit: CPAP follow-up    SUBJECTIVE:   CHIEF COMPLAINT:  No chief complaint on file.  Follow-up visit:  Prior visit: 01/21/2023  Brief HPI:   April May is a 68 y.o. female who was initially evaluated by Dr. Buck on 12/29/2019 with known history of sleep apnea (dx'd 2013 after sleep study) not on CPAP due to difficulty tolerating.  Complaining of lack of energy and daytime naps.  ESS 9/24. FSS 61/63.  Repeat sleep study 01/05/2020 showed moderate sleep apnea with total AHI 15.9/h and O2 nadir of 82%.  CPap initiated 08/08/2020. DME [facility].   At prior visit, CPAP pressure setting adjustment made from 9 to 10 due to sensation of not getting enough air.     Interval history:  Returns for follow-up CPAP visit.  Reports improved sleep after pressure setting adjustment after prior visit.   Compliance report shows satisfactory usage and optimal residual AHI. Current use of nasal pillow mask. She does prefer to sleep on her stomach/side, will have some issues with straps pressing into her face or mask itself will shift, she is otherwise tolerating the mask well. Doesn't feel like she is getting enough air, she is requesting the pressure to be increased.  Routinely followed by DME [facility].  ESS 6/24.           ROS:   14 system review of systems performed and negative with exception of those listed in HPI  PMH:  Past Medical History:  Diagnosis Date   Abrasion 05/10/2022   right foot   Anal fistula    Anticoagulated    plavix  and asa--- managed by vascular, dr serene   Arthritis    B12 deficiency    CKD (chronic kidney disease), stage I    [facility] --- samantha collins PA (lov in epic/ media tab  02-23-2022)   COPD (chronic obstructive pulmonary disease) (HCC)    Coronary artery calcification seen on CAT scan    cardiologist--- dr jeffrie;    nuclear stress test 10-29-2017  normal perfusion , nuclear ef 60%;   echo 10-16-2017  ef 55-60%, mild TR   Crohn's disease of both small and large intestine (HCC)    followed by dr stark  (GI   Full dentures    GAD (generalized anxiety disorder)    History of acute pancreatitis    secondary to azathioprine   History of small bowel obstruction 11/24/2014   admission in epic  for crohn's flare w/ sbo and abscess s/p  surgical intervention   History of squamous cell carcinoma of skin 06/11/2017   excision left arm   Hyperlipidemia, mixed    followed by cardiology and pcp;   statin intolerate   MDD (major depressive disorder)    OSA on CPAP    followed by dr inocente ;  (05-16-2022  per pt uses nightly)   study in epic 01-05-2020  AHI 15.4/hr;  does not use cpap since weight loss 1 and 1/2 years ago   Perianal lesion    Peripheral vascular disease 02/2022   vascular--- dr serene;   03-20-2022  s/p  balloon angioplasty right occluded peroneal artery, right anterior tibial artery, right tibioperoneal truck   PONV (postoperative nausea and vomiting)  Pre-diabetes    Resistant hypertension    followed by pcp and cardiology   Swelling of right foot 05/10/2022   Vitamin D  deficiency     PSH:  Past Surgical History:  Procedure Laterality Date   ABDOMINAL AORTOGRAM W/LOWER EXTREMITY N/A 03/20/2022   Procedure: ABDOMINAL AORTOGRAM W/LOWER EXTREMITY;  Surgeon: Serene Gaile ORN, MD;  Location: [facility];  Service: Cardiovascular;  Laterality: N/A;   ABDOMINAL HYSTERECTOMY     age 31;  per pt thinks ovaries were removed   APPENDECTOMY     teen   APPLICATION OF WOUND VAC  11/24/2014   Procedure: APPLICATION OF WOUND VAC;  Surgeon: Donnice Lima, MD;  Location: [facility];  Service: General;;   BOWEL RESECTION N/A 11/24/2014   Procedure:  SMALL BOWEL RESECTION;  Surgeon: Donnice Lima, MD;  Location: [facility];  Service: General;  Laterality: N/A;   BREAST EXCISIONAL BIOPSY Right 1972   benign   LAPAROTOMY N/A 11/24/2014   Procedure: EXPLORATORY LAPAROTOMY EXTENSIVE LYSIS OF ADHESIONS SAMLL BOWEL RESECTION RIGHT HEMI COLECTOMY WOUND VAC APPLICATION. ;  Surgeon: Donnice Lima, MD;  Location: [facility];  Service: General;  Laterality: N/A;   LYSIS OF ADHESION  11/24/2014   Procedure: LYSIS OF ADHESION (3 HRS);  Surgeon: Donnice Lima, MD;  Location: [facility];  Service: General;;   PARTIAL COLECTOMY  11/24/2014   Procedure: RIGHT HEMI COLECTOMY;  Surgeon: Donnice Lima, MD;  Location: [facility];  Service: General;;   PERIPHERAL VASCULAR BALLOON ANGIOPLASTY  03/20/2022   Procedure: PERIPHERAL VASCULAR BALLOON ANGIOPLASTY;  Surgeon: Serene Gaile ORN, MD;  Location: [facility];  Service: Cardiovascular;;  AT and Peroneal   SMALL INTESTINE SURGERY     age 70  (for crohn's disease)   TONSILLECTOMY     teen    Social History:  Social History   Socioeconomic History   Marital status: Divorced    Spouse name: Not on file   Number of children: 2   Years of education: Not on file   Highest education level: 12th grade  Occupational History   Occupation: disabled  Tobacco Use   Smoking status: Every Day    Current packs/day: 0.75    Average packs/day: 0.8 packs/day for 48.0 years (36.0 ttl pk-yrs)    Types: Cigarettes    Passive exposure: Current   Smokeless tobacco: Never   Tobacco comments:    05-16-2022  per pt started smoking as a teen and trying to quit  Vaping Use   Vaping status: Never Used  Substance and Sexual Activity   Alcohol use: No   Drug use: Never   Sexual activity: Not on file  Other Topics Concern   Not on file  Social History Narrative   Not on file   Social Drivers of Health   Financial Resource Strain: Low Risk  (09/27/2023)   Overall Financial Resource Strain (CARDIA)    Difficulty of Paying  Living Expenses: Not hard at all  Food Insecurity: No Food Insecurity (09/27/2023)   Hunger Vital Sign    Worried About Running Out of Food in the Last Year: Never true    Ran Out of Food in the Last Year: Never true  Transportation Needs: No Transportation Needs (09/27/2023)   PRAPARE - Administrator, Civil Service (Medical): No    Lack of Transportation (Non-Medical): No  Physical Activity: Sufficiently Active (09/27/2023)   Exercise Vital Sign    Days of Exercise per Week: 5 days  Minutes of Exercise per Session: 30 min  Stress: No Stress Concern Present (09/27/2023)   [employer] of Occupational Health - Occupational Stress Questionnaire    Feeling of Stress : Not at all  Social Connections: Socially Isolated (09/27/2023)   Social Connection and Isolation Panel    Frequency of Communication with Friends and Family: Once a week    Frequency of Social Gatherings with Friends and Family: More than three times a week    Attends Religious Services: Never    Database Administrator or Organizations: No    Attends Banker Meetings: Never    Marital Status: Divorced  Catering Manager Violence: Not At Risk (09/27/2023)   Humiliation, Afraid, Rape, and Kick questionnaire    Fear of Current or Ex-Partner: No    Emotionally Abused: No    Physically Abused: No    Sexually Abused: No    Family History:  Family History  Problem Relation Age of Onset   Hypertension Mother    Ulcerative colitis Mother    Crohn's disease Mother    Heart disease Mother    Kidney disease Mother    Arthritis Father    Brain cancer Father    Diabetes Father    Heart failure Sister    Kidney disease Sister    Other Sister        prediabetes   Alcohol abuse Paternal Uncle    Diabetes Paternal Grandmother    Stroke Paternal Grandmother    Heart failure Brother    Kidney cancer Brother    Breast cancer Neg Hx     Medications:   Current Outpatient Medications on File Prior  to Visit  Medication Sig Dispense Refill   ammonium lactate (AMLACTIN DAILY) 12 % lotion Apply 1 Application topically as needed. 400 g 0   albuterol  (PROAIR  HFA) 108 (90 Base) MCG/ACT inhaler TAKE 2 PUFFS BY MOUTH EVERY 6 HOURS AS NEEDED FOR WHEEZE OR SHORTNESS OF BREATH (Patient taking differently: Inhale 2 puffs into the lungs every 6 (six) hours as needed for shortness of breath or wheezing. TAKE 2 PUFFS BY MOUTH EVERY 6 HOURS AS NEEDED FOR WHEEZE OR SHORTNESS OF BREATH) 18 g 5   amLODipine  (NORVASC ) 10 MG tablet TAKE 1 TABLET BY MOUTH DAILY 100 tablet 0   ammonium lactate (AMLACTIN DAILY) 12 % lotion Apply 1 Application topically as needed. 400 g 0   budesonide  (ENTOCORT EC ) 3 MG 24 hr capsule Take 3 capsules (9 mg total) by mouth daily. 270 capsule 3   buPROPion  (WELLBUTRIN  XL) 150 MG 24 hr tablet TAKE 1 TABLET BY MOUTH DAILY 100 tablet 2   Cholecalciferol  (VITAMIN D3) 1.25 MG (50000 UT) capsule TAKE 1 CAPSULE BY MOUTH ONE TIME PER WEEK 12 capsule 0   clindamycin (CLEOCIN T) 1 % lotion      clonazePAM  (KLONOPIN ) 0.5 MG tablet Take 1 tablet (0.5 mg total) by mouth 2 (two) times daily as needed for anxiety. 60 tablet 3   clopidogrel  (PLAVIX ) 75 MG tablet TAKE 1 TABLET BY MOUTH EVERY DAY 90 tablet 3   clotrimazole  (MYCELEX ) 10 MG troche Take 1 tablet (10 mg total) by mouth 5 (five) times daily. 70 Troche 1   [employer] PAIN RELIEF EXTRA STRENGTH 500 MG tablet TAKE 1 TABLET BY MOUTH EVERY 8 HOURS AS NEEDED FOR MODERATE PAIN (Patient taking differently: Take 500-1,000 mg by mouth every 8 (eight) hours as needed for moderate pain (pain score 4-6).) 60 tablet 5   Cyanocobalamin  (  B-12 COMPLIANCE INJECTION) 1000 MCG/ML KIT Inject as directed every 30 (thirty) days.     ezetimibe  (ZETIA ) 10 MG tablet Take 1 tablet (10 mg total) by mouth daily. In order to get a 1year supply, pt must schedule appt with provider - 3rd attempt 15 tablet 0   fluticasone  (FLONASE ) 50 MCG/ACT nasal spray Place 2 sprays into both  nostrils daily. 48 g 3   lamoTRIgine  (LAMICTAL ) 200 MG tablet TAKE 1 TABLET BY MOUTH AT  BEDTIME 100 tablet 2   Potassium Citrate  15 MEQ (1620 MG) TBCR Take 1 tablet by mouth in the morning, at noon, and at bedtime. Schedule an appt for further refills 90 tablet 0   Risankizumab -rzaa (SKYRIZI ) 360 MG/2.4ML SOCT Inject 360 mg into the skin once for 1 dose at week 12. Then once every 8 weeks.     Semaglutide  (RYBELSUS ) 3 MG TABS Take 1 tablet (3 mg total) by mouth daily. 30 tablet 0   spironolactone  (ALDACTONE ) 100 MG tablet TAKE 1 TABLET BY MOUTH DAILY 80 tablet 3   Tiotropium Bromide-Olodaterol (STIOLTO RESPIMAT ) 2.5-2.5 MCG/ACT AERS Inhale 2 puffs into the lungs daily. USE 2 INHALATIONS BY MOUTH  DAILY 12 g 1   triamcinolone  cream (KENALOG) 0.1 % Apply 1 Application topically as needed.     No current facility-administered medications on file prior to visit.    Allergies:   Allergies  Allergen Reactions   Azathioprine Other (See Comments)    Side effects to pancrease      Crestor  [Rosuvastatin  Calcium ] Other (See Comments)    Muscle and joint aches   Lipitor [Atorvastatin  Calcium ] Other (See Comments)    Muscle cramps   Semaglutide  Other (See Comments)    Constipation    Nsaids Other (See Comments)    Crohn's disease = NO  NSIADs   Lisinopril      Uncontrollable head shaking       OBJECTIVE:  Physical Exam  There were no vitals filed for this visit.   There is no height or weight on file to calculate BMI. No results found.   General: well developed, well nourished, very pleasant middle-aged African-American female, seated, in no evident distress Head: head normocephalic and atraumatic.   Neck: supple with no carotid or supraclavicular bruits Cardiovascular: regular rate and rhythm, no murmurs Musculoskeletal: no deformity Skin:  no rash/petichiae Vascular:  Normal pulses all extremities   Neurologic Exam Mental Status: Awake and fully alert. Oriented to place and  time. Recent and remote memory intact. Attention span, concentration and fund of knowledge appropriate. Mood and affect appropriate.  Cranial Nerves: Pupils equal, briskly reactive to light. Extraocular movements full without nystagmus. Visual fields full to confrontation. Hearing intact. Facial sensation intact. Face, tongue, palate moves normally and symmetrically.  Motor: Normal bulk and tone. Normal strength in all tested extremity muscles Sensory.: intact to touch , pinprick , position and vibratory sensation.  Coordination: Rapid alternating movements normal in all extremities. Finger-to-nose and heel-to-shin performed accurately bilaterally. Gait and Station: Arises from chair without difficulty. Stance is normal. Gait demonstrates normal stride length and balance without use of AD. Tandem walk and heel toe without difficulty.  Reflexes: 1+ and symmetric. Toes downgoing.         ASSESSMENT/PLAN: April May is a 68 y.o. year old female     OSA on CPAP :  Compliance report shows satisfactory usage with optimal residual AHI.  Will slightly increase pressure from 9 to 10 with EPR level 3 per patient  request, will repeat download in 2 months to ensure apnea still being well treated. Will request f/u with DME to ensure good mask fit and to see if any other mask strap options are available, did discuss use of CPAP pillow to see if this helps with comfort when sleeping on stomach/side.  Discussed continued nightly usage with ensuring greater than 4 hours nightly for optimal benefit and per insurance purposes.   Continue to follow with DME company for any needed supplies or CPAP related concerns CPAP set up 07/2020     Follow up in 1 year or call earlier if needed   CC:  PCP: Joshua Debby CROME, MD    I personally spent a total of *** minutes in the care of the patient today including {Time Based Coding:210964241}.    Harlene Bogaert, AGNP-BC  [facility] [address] [place], [place] [address]  Phone [phone] Fax [phone] Note: This document was prepared with digital dictation and possible smart phrase technology. Any transcriptional errors that result from this process are unintentional.

## 2024-03-17 ENCOUNTER — Ambulatory Visit (INDEPENDENT_AMBULATORY_CARE_PROVIDER_SITE_OTHER): Admitting: Adult Health

## 2024-03-17 ENCOUNTER — Encounter: Payer: Self-pay | Admitting: Adult Health

## 2024-03-17 VITALS — BP 144/81 | HR 67 | Ht 60.0 in | Wt 226.0 lb

## 2024-03-17 DIAGNOSIS — G4733 Obstructive sleep apnea (adult) (pediatric): Secondary | ICD-10-CM | POA: Diagnosis not present

## 2024-03-17 NOTE — Patient Instructions (Addendum)
 Your Plan:  Continue nightly use of CPAP with ensuring greater than 4 hours per night for optimal benefit and per insurance requirements  Continue to follow with your DME [facility] for any new supplies or CPAP related concerns      Follow-up in 1 year or call earlier if needed      Thank you for coming to see us  at [facility]. I hope we have been able to provide you high quality care today.  You may receive a patient satisfaction survey over the next few weeks. We would appreciate your feedback and comments so that we may continue to improve ourselves and the health of our patients.

## 2024-03-17 NOTE — Progress Notes (Signed)
 Community message has been sent to [facility] for pressure and supplies on 03/17/24. DD

## 2024-03-18 ENCOUNTER — Encounter: Payer: Self-pay | Admitting: Internal Medicine

## 2024-03-19 ENCOUNTER — Telehealth: Payer: Self-pay

## 2024-03-19 NOTE — Telephone Encounter (Signed)
 Refill form signed & faxed over to [facility] and copy to [place] with [facility] as well.,

## 2024-03-25 ENCOUNTER — Other Ambulatory Visit: Payer: Self-pay | Admitting: Internal Medicine

## 2024-03-25 ENCOUNTER — Telehealth: Payer: Self-pay

## 2024-03-25 DIAGNOSIS — R7303 Prediabetes: Secondary | ICD-10-CM

## 2024-03-25 NOTE — Telephone Encounter (Signed)
 Copied from CRM [id number]. Topic: Clinical - Medication Question >> Mar 25, 2024  8:24 AM Franky GRADE wrote: Reason for CRM: Patient is calling to get an update on a refill request the pharmacy put in for Semaglutide  (RYBELSUS ) 3 MG TABS [[id number]], she is concerned because she is almost out and also wanted to know if Dr.Jones was going to increase the dosage.

## 2024-03-27 ENCOUNTER — Telehealth: Payer: Self-pay

## 2024-03-27 ENCOUNTER — Ambulatory Visit: Payer: Self-pay

## 2024-03-27 NOTE — Telephone Encounter (Signed)
 Unable to reach patient for triage x 3 attempts.

## 2024-03-27 NOTE — Telephone Encounter (Signed)
 Copied from CRM #[id number]. Topic: Clinical - Prescription Issue >> Mar 27, 2024 12:40 PM Burnard DEL wrote: Reason for CRM: Patient called in stating that the rybelsus  medication dosage was supposed to be increased from 3mg . Patient stated that provider sent in prescription today for the same 3mg  dosage.

## 2024-03-27 NOTE — Telephone Encounter (Signed)
 2nd attempt to contact patient-no answer. Voicemail left for patient to call back to Nurse Triage. Will place in callbacks.

## 2024-03-27 NOTE — Telephone Encounter (Signed)
 VM left request pt call back for nurse triage.  Copied from CRM #[id number]. Topic: Clinical - Prescription Issue >> Mar 27, 2024 11:37 AM Alfonso ORN wrote: Reason for CRM: pt stating that she sent a message 11/5 regarding thrush medication advised cma clarified if pt would need an appt no further response. Please contact pt to advise as she said she needs medication in liquid form . Callback:[phone]

## 2024-03-28 ENCOUNTER — Encounter: Payer: Self-pay | Admitting: Internal Medicine

## 2024-03-30 ENCOUNTER — Telehealth: Payer: Self-pay | Admitting: Internal Medicine

## 2024-03-30 NOTE — Telephone Encounter (Signed)
 Medication has been refilled.

## 2024-03-30 NOTE — Telephone Encounter (Signed)
Medication was sent in for patient. 

## 2024-03-30 NOTE — Telephone Encounter (Signed)
 Inbound call from pharmacy stating they would like to speak to nurse in regards to patients medication skyrizi , good call back number [phone] Please advise  Thank you

## 2024-03-30 NOTE — Telephone Encounter (Signed)
 Tax Inspector and spoke with Pharmacist.   Pharmacist stated that they wanted to confirm that the  pt has been trained and is aware to refrigerate the medication. Pharmacist was notified that the pt has been trained and knows to refrigerate the medication. Confirmed with [name] with [facility].  Pt made aware.  Pt verbalized understanding with all questions answered.    April May

## 2024-03-30 NOTE — Telephone Encounter (Signed)
 NOTED

## 2024-04-01 ENCOUNTER — Telehealth: Payer: Self-pay

## 2024-04-01 NOTE — Progress Notes (Unsigned)
 Cardiology Office Note   Date:  04/02/2024  ID:  April May, [place] 11-Feb-1956, MRN [id number] PCP: April Debby CROME, MD  [facility] Providers Cardiologist:  April Parchment, MD     History of Present Illness April May is a 68 y.o. female with a past medical history of CAD noted on CT imaging, hypertension, PAD, COPD, dyslipidemia, OSA on CPAP, tobacco abuse  10/31/2022 Lexiscan  normal, low risk 10/29/2017 Lexiscan  no evidence of ischemia 10/16/2017 echo EF 55 to 60%  She is a long standing patient of April May initially established with him in 2022 for the evaluation of bradycardia and coronary artery calcification that was noted on CT imaging, she also had some fatigue.  She also follows with April May for PAD with diffuse disease in her right lower extremity, she underwent angiography in 2023 and was found to have no significant aortic iliac or outflow disease but did have small occlusion of all tibial vessels requiring angioplasty.  Evaluated in the emergency department on 2024 with chest pain, workup was overall unrevealing, but her blood pressure was very elevated.  Most recently she was evaluated by April May on 10/19/2022, this was a preoperative evaluation, Lexiscan  was arranged which was a normal, low risk study and she was ultimately advised to follow-up in 1 year.  She presents today for follow-up of her CAD.  She has been doing relatively well since she was last evaluated in our office.  She does not have any formal complaints.  She does continue to smoke, but has cut back to half a May a day, she plans to stop completely.  She is not participating in any physical activity, we discussed this at length, encouraged her to shoot for at least 10 minutes of exercise each day as this is an achievable goal.  Her blood pressure is controlled.  Her lipids are monitored by her PCP and are also well-controlled. She denies chest pain, palpitations, dyspnea, pnd,  orthopnea, n, v, dizziness, syncope, edema, weight gain, or early satiety.     ROS: ROS   Studies Reviewed      Cardiac Studies & Procedures   ______________________________________________________________________________________________   STRESS TESTS  MYOCARDIAL PERFUSION IMAGING 10/31/2022  Interpretation Summary   The study is normal. The study is low risk.   horizontal ST depression (II, III, aVF and V6) was noted.   LV perfusion is normal. There is no evidence of ischemia. There is no evidence of infarction.   Left ventricular function is normal. Nuclear stress EF: 69 %. The left ventricular ejection fraction is hyperdynamic (>65%). End diastolic cavity size is normal. End systolic cavity size is normal.  Normal perfusion Low risk study   ECHOCARDIOGRAM  ECHOCARDIOGRAM COMPLETE 10/16/2017  Narrative *[facility]* [address] [place], [place] [address] [phone]  ------------------------------------------------------------------- Transthoracic Echocardiography  Patient:    April May MR #:       [id number] Study Date: 10/16/2017 Gender:     F Age:        47 Height:     154.9 cm Weight:     100.1 kg BSA:        2.13 m^2 Pt. Status: Room:  ATTENDING    April May TISA April May REFERRING    April May SONOGRAPHER  Waldo [place], RCS PERFORMING   [facility], Outpatient  cc:  ------------------------------------------------------------------- LV EF: 55% -   60%  ------------------------------------------------------------------- Indications:  Abnormal ECG (R94.31).  ------------------------------------------------------------------- History:   PMH:   Dyspnea and murmur.  ------------------------------------------------------------------- Study Conclusions  - Left ventricle: The cavity size was normal. Systolic function was normal. The estimated ejection fraction was in the range of 55% to 60%. Wall  motion was normal; there were no regional wall motion abnormalities. Left ventricular diastolic function parameters were normal. - Atrial septum: No defect or patent foramen ovale was identified.  ------------------------------------------------------------------- Study data:  No prior study was available for comparison.  Study status:  Routine.  Procedure:  The patient reported no pain pre or post test. Transthoracic echocardiography. Image quality was adequate.          Transthoracic echocardiography.  M-mode, complete 2D, spectral Doppler, and color Doppler.  Birthdate: Patient birthdate: [birth year].  Age:  Patient is 68 yr old.  Sex: Gender: female.    BMI: 41.7 kg/m^2.  Blood pressure:     140/90 Patient status:  Outpatient.  Study date:  Study date: 10/16/2017. Study time: 08:34 AM.  Location:  [facility]  -------------------------------------------------------------------  ------------------------------------------------------------------- Left ventricle:  The cavity size was normal. Systolic function was normal. The estimated ejection fraction was in the range of 55% to 60%. Wall motion was normal; there were no regional wall motion abnormalities. The transmitral flow pattern was normal. The deceleration time of the early transmitral flow velocity was normal. The pulmonary vein flow pattern was normal. The tissue Doppler parameters were normal. Left ventricular diastolic function parameters were normal.  ------------------------------------------------------------------- Aortic valve:   Trileaflet; mildly thickened leaflets. Mobility was not restricted.  Doppler:  Transvalvular velocity was within the normal range. There was no stenosis. There was no regurgitation.  ------------------------------------------------------------------- Aorta:  Aortic root: The aortic root was normal in size.  ------------------------------------------------------------------- Mitral  valve:   Mildly thickened leaflets . Mobility was not restricted.  Doppler:  Transvalvular velocity was within the normal range. There was no evidence for stenosis. There was trivial regurgitation.    Peak gradient (D): 3 mm Hg.  ------------------------------------------------------------------- Left atrium:  The atrium was normal in size.  ------------------------------------------------------------------- Atrial septum:  No defect or patent foramen ovale was identified.  ------------------------------------------------------------------- Right ventricle:  The cavity size was normal. Wall thickness was normal. Systolic function was normal.  ------------------------------------------------------------------- Pulmonic valve:    Doppler:  Transvalvular velocity was within the normal range. There was no evidence for stenosis. There was trivial regurgitation.  ------------------------------------------------------------------- Tricuspid valve:   Structurally normal valve.    Doppler: Transvalvular velocity was within the normal range. There was mild regurgitation.  ------------------------------------------------------------------- Pulmonary artery:   The main pulmonary artery was normal-sized. Systolic pressure was within the normal range.  ------------------------------------------------------------------- Right atrium:  The atrium was normal in size.  ------------------------------------------------------------------- Pericardium:  The pericardium was normal in appearance. There was no pericardial effusion.  ------------------------------------------------------------------- Systemic veins: Inferior vena cava: The vessel was normal in size. The respirophasic diameter changes were in the normal range (= 50%), consistent with normal central venous pressure. Diameter: 14 mm.  ------------------------------------------------------------------- Measurements  IVC                                         Value        Reference ID  14    mm     ----------  Left ventricle                             Value        Reference LV ID, ED, PLAX chordal            (L)     42    mm     43 - 52 LV ID, ES, PLAX chordal                    29.8  mm     23 - 38 LV fx shortening, PLAX chordal             29    %      >=29 LV PW thickness, ED                        12.6  mm     ---------- IVS/LV PW ratio, ED                        0.77         <=1.3 Stroke volume, 2D                          85    ml     ---------- Stroke volume/bsa, 2D                      40    ml/m^2 ---------- LV e&', lateral                             7.29  cm/s   ---------- LV E/e&', lateral                           11.65        ---------- LV e&', medial                              7.29  cm/s   ---------- LV E/e&', medial                            11.65        ---------- LV e&', average                             7.29  cm/s   ---------- LV E/e&', average                           11.65        ----------  Ventricular septum                         Value        Reference IVS thickness, ED                          9.76  mm     ----------  LVOT  Value        Reference LVOT ID, S                                 20    mm     ---------- LVOT area                                  3.14  cm^2   ---------- LVOT peak velocity, S                      107   cm/s   ---------- LVOT mean velocity, S                      74.4  cm/s   ---------- LVOT VTI, S                                27.2  cm     ----------  Aorta                                      Value        Reference Aortic root ID, ED                         30    mm     ----------  Left atrium                                Value        Reference LA ID, A-P, ES                             37    mm     ---------- LA ID/bsa, A-P                             1.73  cm/m^2 <=2.2 LA  volume, S                               60.8  ml     ---------- LA volume/bsa, S                           28.5  ml/m^2 ---------- LA volume, ES, 1-p A4C                     58.6  ml     ---------- LA volume/bsa, ES, 1-p A4C                 27.5  ml/m^2 ---------- LA volume, ES, 1-p A2C                     59.8  ml     ---------- LA volume/bsa, ES, 1-p A2C                 28    ml/m^2 ----------  Mitral valve                               Value        Reference Mitral E-wave peak velocity                84.9  cm/s   ---------- Mitral A-wave peak velocity                82.9  cm/s   ---------- Mitral deceleration time           (H)     303   ms     150 - 230 Mitral peak gradient, D                    3     mm Hg  ---------- Mitral E/A ratio, peak                     1            ----------  Pulmonary arteries                         Value        Reference PA pressure, S, DP                         10    mm Hg  <=30  Tricuspid valve                            Value        Reference Tricuspid regurg peak velocity             128   cm/s   ---------- Tricuspid peak RV-RA gradient              7     mm Hg  ---------- Tricuspid maximal regurg velocity,         128   cm/s   ---------- PISA  Right atrium                               Value        Reference RA ID, S-I, ES, A4C                        45.9  mm     34 - 49 RA area, ES, A4C                           14.4  cm^2   8.3 - 19.5 RA volume, ES, A/L                         38    ml     ---------- RA volume/bsa, ES, A/L                     17.8  ml/m^2 ----------  Systemic veins                             Value        Reference Estimated CVP  3     mm Hg  ----------  Right ventricle                            Value        Reference TAPSE                                      25.3  mm     ---------- RV pressure, S, DP                         10    mm Hg  <=30 RV s&', lateral, S                          11.5   cm/s   ----------  Legend: (L)  and  (H)  mark values outside specified reference range.  ------------------------------------------------------------------- Prepared and Electronically Authenticated by  Maude Emmer, M.D. 2019-06-05T09:23:13          ______________________________________________________________________________________________      Risk Assessment/Calculations           Physical Exam VS:  BP 135/76   Pulse 74   Wt 220 lb (99.8 kg)   LMP 05/14/1988   BMI 42.97 kg/m        Wt Readings from Last 3 Encounters:  04/02/24 220 lb (99.8 kg)  03/17/24 226 lb (102.5 kg)  02/27/24 223 lb (101.2 kg)    GEN: Well nourished, well developed in no acute distress NECK: No JVD; No carotid bruits CARDIAC: RRR, no murmurs, rubs, gallops RESPIRATORY:  Clear to auscultation without rales, wheezing or rhonchi  ABDOMEN: Soft, non-tender, non-distended EXTREMITIES:  No edema; No deformity   ASSESSMENT AND PLAN CAD - noted on CT imaging >> ischemic evaluation in 2024 was normal, low risk study.  Continue Plavix  75 mg daily, continue Zetia  10 mg daily-as she is statin intolerant.  Encouraged her to increase her physical activity to at least 10 minutes/day and work up from there. Stable with no anginal symptoms. No indication for ischemic evaluation.    Hypertension-blood pressure is acceptable 135/76, continue Norvasc  10 mg daily, spironolactone  100 mg daily.  PAD-currently on Plavix  75 mg daily, Zetia  10 mg daily.  Denies claudication.  Tobacco abuse-she is currently working on cutting down, smoking half a May a day, she is aware of the deleterious side effects of smoking and is trying to quit, feels she can stop on her own.   Dyslipidemia - LDL controlled at 52 on 12/11/2023, LP(a) 82 on 12/14/2020. Continue Zetia  10 mg daily, monitored by PCP.        Dispo: 1 year.   Signed, [name] JAYSON Hoover, NP

## 2024-04-01 NOTE — Telephone Encounter (Signed)
 Spoke with Pt.  Spoke with [name] with [facility].  [name] stated that pt medication is to be delivered on Friday 04/03/2024 and the [facility] Nurse to come out to pt house on 04/13/2024 due to pt currently having Sinusitis to instruct pt on how to self administer. SABRA [name] made pt aware.

## 2024-04-01 NOTE — Telephone Encounter (Signed)
 Copied from CRM [id number]. Topic: Clinical - Medication Question >> Apr 01, 2024  1:24 PM Deaijah H wrote: Reason for CRM: Patient would like to know if Dr. Joshua could send in a script for thrush (liquid mouthwash) Please call to confir and would like for nurse to leave information on VM if she happens to miss call

## 2024-04-01 NOTE — Telephone Encounter (Signed)
 Inbound call from patient requesting a call to discuss if she can have Skyrizi  injection this Friday because she states the nurse will not be in to give her the injection on the 25 due to nurse being out of town. Nurse is wanting to know if she can have it before or after the 25. Please advise.

## 2024-04-02 ENCOUNTER — Ambulatory Visit: Attending: Cardiology | Admitting: Cardiology

## 2024-04-02 VITALS — BP 135/76 | HR 74 | Wt 220.0 lb

## 2024-04-02 DIAGNOSIS — E782 Mixed hyperlipidemia: Secondary | ICD-10-CM | POA: Diagnosis not present

## 2024-04-02 DIAGNOSIS — I739 Peripheral vascular disease, unspecified: Secondary | ICD-10-CM

## 2024-04-02 DIAGNOSIS — I10 Essential (primary) hypertension: Secondary | ICD-10-CM

## 2024-04-02 DIAGNOSIS — I2510 Atherosclerotic heart disease of native coronary artery without angina pectoris: Secondary | ICD-10-CM | POA: Diagnosis not present

## 2024-04-02 DIAGNOSIS — F17200 Nicotine dependence, unspecified, uncomplicated: Secondary | ICD-10-CM

## 2024-04-02 MED ORDER — CLOPIDOGREL BISULFATE 75 MG PO TABS: 75.0000 mg | ORAL_TABLET | Freq: Every day | ORAL | 3 refills | Status: AC

## 2024-04-02 MED ORDER — EZETIMIBE 10 MG PO TABS: 10.0000 mg | ORAL_TABLET | Freq: Every day | ORAL | 3 refills | Status: AC

## 2024-04-02 NOTE — Patient Instructions (Signed)
 Medication Instructions:  No changes.   *If you need a refill on your cardiac medications before your next appointment, please call your pharmacy*  Lab Work:  If you have labs (blood work) drawn today and your tests are completely normal, you will receive your results only by: MyChart Message (if you have MyChart) OR A paper copy in the mail If you have any lab test that is abnormal or we need to change your treatment, we will call you to review the results.  Testing/Procedures:   Follow-Up: At [facility], you and your health needs are our priority.  As part of our continuing mission to provide you with exceptional heart care, our providers are all part of one team.  This team includes your primary Cardiologist (physician) and Advanced Practice Providers or APPs (Physician Assistants and Nurse Practitioners) who all work together to provide you with the care you need, when you need it.  Your next appointment:   1 year(s)  Provider:   Oneil Parchment, MD    We recommend signing up for the patient portal called MyChart.  Sign up information is provided on this After Visit Summary.  MyChart is used to connect with patients for Virtual Visits (Telemedicine).  Patients are able to view lab/test results, encounter notes, upcoming appointments, etc.  Non-urgent messages can be sent to your provider as well.   To learn more about what you can do with MyChart, go to [email or web address].   Other Instructions

## 2024-04-03 NOTE — Telephone Encounter (Signed)
 She needs an appointment for this? Please advise.

## 2024-04-14 ENCOUNTER — Telehealth: Admitting: Internal Medicine

## 2024-04-14 ENCOUNTER — Ambulatory Visit: Admitting: Internal Medicine

## 2024-04-14 DIAGNOSIS — J302 Other seasonal allergic rhinitis: Secondary | ICD-10-CM | POA: Diagnosis not present

## 2024-04-14 MED ORDER — OLOPATADINE HCL 0.2 % OP SOLN: 1.0000 [drp] | Freq: Every day | OPHTHALMIC | 0 refills | Status: AC

## 2024-04-14 MED ORDER — LEVOCETIRIZINE DIHYDROCHLORIDE 5 MG PO TABS: 5.0000 mg | ORAL_TABLET | Freq: Every evening | ORAL | 2 refills | Status: AC

## 2024-04-14 MED ORDER — AZELASTINE HCL 0.1 % NA SOLN: 2.0000 | Freq: Two times a day (BID) | NASAL | 12 refills | Status: AC

## 2024-04-14 NOTE — Progress Notes (Signed)
 Virtual Visit via Video Note  I connected with April May on 04/14/24 at  3:20 PM EST by a video enabled telemedicine application and verified that I am speaking with the correct person using two identifiers.   I discussed the limitations of evaluation and management by telemedicine and the availability of in person appointments. The patient expressed understanding and agreed to proceed.  Present for the visit:  Myself, Dr Glade Hope, [place] Lager.  The patient is currently at home and I am in the office.    No referring provider.    History of Present Illness: She is here for an acute visit for cold symptoms.   Her symptoms started last week  She is experiencing runny nose, sneezing, itchy eyes are her main symptoms.  She feels it is related to allergies or possibly a cold.  Symptoms have not been getting better.  She has not had any fevers, chills, nasal congestion, ear pain, sinus pain or sore throat.  She does have a little shortness of breath which is chronic.  She denies coughing, wheezing and headaches.  She has tried taking  [facility] allergy medication without improvement   Review of Systems  Constitutional:  Negative for chills and fever.  HENT:  Negative for congestion, ear pain, sinus pain and sore throat.        Sneezing, runny nose  Eyes:        Eyes itchy  Respiratory:  Positive for shortness of breath. Negative for cough and wheezing.   Neurological:  Negative for dizziness and headaches.      Social History   Socioeconomic History   Marital status: Divorced    Spouse name: Not on file   Number of children: 2   Years of education: Not on file   Highest education level: Some college, no degree  Occupational History   Occupation: disabled  Tobacco Use   Smoking status: Every Day    Current packs/day: 0.75    Average packs/day: 0.8 packs/day for 48.0 years (36.0 ttl pk-yrs)    Types: Cigarettes    Passive exposure: Current   Smokeless  tobacco: Never   Tobacco comments:    05-16-2022  per pt started smoking as a teen and trying to quit  Vaping Use   Vaping status: Never Used  Substance and Sexual Activity   Alcohol use: No   Drug use: Never   Sexual activity: Not on file  Other Topics Concern   Not on file  Social History Narrative   Not on file   Social Drivers of Health   Financial Resource Strain: Low Risk  (04/14/2024)   Overall Financial Resource Strain (CARDIA)    Difficulty of Paying Living Expenses: Not very hard  Food Insecurity: No Food Insecurity (04/14/2024)   Hunger Vital Sign    Worried About Running Out of Food in the Last Year: Never true    Ran Out of Food in the Last Year: Never true  Transportation Needs: No Transportation Needs (04/14/2024)   PRAPARE - Administrator, Civil Service (Medical): No    Lack of Transportation (Non-Medical): No  Physical Activity: Inactive (04/14/2024)   Exercise Vital Sign    Days of Exercise per Week: 0 days    Minutes of Exercise per Session: Not on file  Stress: No Stress Concern Present (04/14/2024)   [employer] of Occupational Health - Occupational Stress Questionnaire    Feeling of Stress: Not at all  Social Connections: Socially Isolated (  04/14/2024)   Social Connection and Isolation Panel    Frequency of Communication with Friends and Family: Not on file    Frequency of Social Gatherings with Friends and Family: More than three times a week    Attends Religious Services: Patient declined    Database Administrator or Organizations: No    Attends Engineer, Structural: Not on file    Marital Status: Divorced     Observations/Objective: Appears well in NAD Eyes appear slightly erythematous and watery Breathing normally   Assessment and Plan:  Allergic rhinitis versus possible URI Discussed symptoms are consistent with possible seasonal allergies, which she has had in the past Discussed there could be a component of a  viral upper respiratory infection, but treatment would basically be the same Start Xyzal 5 mg nightly Start Astelin nasal spray daily-Flonase  has not been effective for her in the past Pataday eyedrops Call or follow-up if no improvement   Follow Up Instructions:    I discussed the assessment and treatment plan with the patient. The patient was provided an opportunity to ask questions and all were answered. The patient agreed with the plan and demonstrated an understanding of the instructions.   The patient was advised to call back or seek an in-person evaluation if the symptoms worsen or if the condition fails to improve as anticipated.    Glade JINNY Hope, MD

## 2024-04-17 ENCOUNTER — Encounter: Payer: Self-pay | Admitting: Internal Medicine

## 2024-04-21 ENCOUNTER — Other Ambulatory Visit: Payer: Self-pay | Admitting: Internal Medicine

## 2024-04-22 ENCOUNTER — Encounter: Payer: Self-pay | Admitting: Internal Medicine

## 2024-04-22 ENCOUNTER — Ambulatory Visit: Payer: Self-pay

## 2024-04-22 NOTE — Telephone Encounter (Signed)
 FYI Only or Action Required?: Action required by provider: clinical question for provider and update on patient condition.  Patient was last seen in primary care on 04/14/2024 by Geofm Glade PARAS, MD.  Called Nurse Triage reporting Medication Problem.  Triage Disposition: Call PCP Now  Patient/caregiver understands and will follow disposition?: Yes  Copied from CRM #[id number]. Topic: Clinical - Prescription Issue >> Apr 22, 2024  4:13 PM Rea ORN wrote: Reason for CRM: Pt called concerned that PCP discontinued Ryelsus from her current med list. Pt stated last time she spoke with him he was going to increase to next dose. There is not another rx for it. Pt has one week left and is concerned that it is now listed as discontinued. Pt declined me sending a message to clinic to get a call back from CMA/nurse and requested speak with NT today. Reason for Disposition  [1] Caller has URGENT medicine question about med that primary care doctor (or NP/PA) or specialist prescribed AND [2] triager unable to answer question  Answer Assessment - Initial Assessment Questions Patient states she is needing to go up to the next dose for her Rybelsus . No constipation. Patient is asking for a call back.  1. NAME of MEDICINE: What medicine(s) are you calling about?     Rybelsus  2. QUESTION: What is your question? (e.g., double dose of medicine, side effect)     Patient states order was discontinued and patient is concerned of why the order was stopped. Patient is asking for her Rybelsus  to be increased 7 mg from the 3 mg. Patient  3. PRESCRIBER: Who prescribed the medicine? Reason: if prescribed by specialist, call should be referred to that group.     Debby Molt MD 4. SYMPTOMS: Do you have any symptoms? If Yes, ask: What symptoms are you having?  How bad are the symptoms (e.g., mild, moderate, severe)     No symptoms  Protocols used: Medication Question Call-A-AH

## 2024-04-23 ENCOUNTER — Other Ambulatory Visit: Payer: Self-pay

## 2024-04-23 MED ORDER — MONTELUKAST SODIUM 10 MG PO TABS: 10.0000 mg | ORAL_TABLET | Freq: Every day | ORAL | 5 refills | Status: AC

## 2024-04-24 NOTE — Telephone Encounter (Signed)
 This is being handled via My chart Messages.

## 2024-04-25 ENCOUNTER — Other Ambulatory Visit: Payer: Self-pay | Admitting: Internal Medicine

## 2024-04-25 DIAGNOSIS — R7303 Prediabetes: Secondary | ICD-10-CM

## 2024-04-25 MED ORDER — RYBELSUS 7 MG PO TABS: 7.0000 mg | ORAL_TABLET | Freq: Every day | ORAL | 0 refills | Status: AC

## 2024-04-27 ENCOUNTER — Telehealth: Payer: Self-pay

## 2024-04-27 NOTE — Telephone Encounter (Signed)
 Copied from CRM #[id number]. Topic: Clinical - Prescription Issue >> Apr 22, 2024  4:13 PM Rea ORN wrote: Reason for CRM: Pt called concerned that PCP discontinued Ryelsus from her current med list. Pt stated last time she spoke with him he was going to increase to next dose. There is not another rx for it. Pt has one week left and is concerned that it is now listed as discontinued. Pt declined me sending a message to clinic to get a call back from CMA/nurse and requested speak with NT today. >> Apr 27, 2024  8:11 AM Alexandria E wrote: Patient called in stating that she only has 1 pill left of her Rybelsus  and she is needing a refill sent in today. Relayed to patient that agent will get this concern sent over to a nurse. Noticed that the pharmacy did receive this medication on 12/13, but she is wanting this sent [facility], not [facility].

## 2024-04-28 NOTE — Telephone Encounter (Signed)
 Patient has received her medication

## 2024-05-11 ENCOUNTER — Ambulatory Visit

## 2024-05-21 ENCOUNTER — Ambulatory Visit (INDEPENDENT_AMBULATORY_CARE_PROVIDER_SITE_OTHER)

## 2024-05-21 DIAGNOSIS — E538 Deficiency of other specified B group vitamins: Secondary | ICD-10-CM

## 2024-05-21 MED ORDER — CYANOCOBALAMIN 1000 MCG/ML IJ SOLN
1000.0000 ug | Freq: Once | INTRAMUSCULAR | Status: AC
  Administered 2024-05-21: 1000 ug via INTRAMUSCULAR

## 2024-05-21 NOTE — Progress Notes (Signed)
Pt was given B12 injection w/o any complications. 

## 2024-05-23 ENCOUNTER — Encounter: Payer: Self-pay | Admitting: Internal Medicine

## 2024-05-27 ENCOUNTER — Telehealth: Payer: Self-pay

## 2024-05-27 NOTE — Telephone Encounter (Signed)
 Cpap orders faxed to [facility] on 05/27/2024

## 2024-05-29 ENCOUNTER — Ambulatory Visit (INDEPENDENT_AMBULATORY_CARE_PROVIDER_SITE_OTHER): Admitting: Internal Medicine

## 2024-05-29 ENCOUNTER — Encounter: Payer: Self-pay | Admitting: Internal Medicine

## 2024-05-29 ENCOUNTER — Other Ambulatory Visit (INDEPENDENT_AMBULATORY_CARE_PROVIDER_SITE_OTHER)

## 2024-05-29 ENCOUNTER — Ambulatory Visit: Payer: Self-pay | Admitting: Internal Medicine

## 2024-05-29 VITALS — BP 122/84 | HR 79 | Ht 60.0 in | Wt 217.0 lb

## 2024-05-29 DIAGNOSIS — Z7902 Long term (current) use of antithrombotics/antiplatelets: Secondary | ICD-10-CM

## 2024-05-29 DIAGNOSIS — K5900 Constipation, unspecified: Secondary | ICD-10-CM

## 2024-05-29 DIAGNOSIS — K50819 Crohn's disease of both small and large intestine with unspecified complications: Secondary | ICD-10-CM

## 2024-05-29 LAB — CBC WITH DIFFERENTIAL/PLATELET
Basophils Absolute: 0.1 K/uL (ref 0.0–0.1)
Basophils Relative: 0.6 % (ref 0.0–3.0)
Eosinophils Absolute: 0.1 K/uL (ref 0.0–0.7)
Eosinophils Relative: 0.8 % (ref 0.0–5.0)
HCT: 47.1 % — ABNORMAL HIGH (ref 36.0–46.0)
Hemoglobin: 15.8 g/dL — ABNORMAL HIGH (ref 12.0–15.0)
Lymphocytes Relative: 26 % (ref 12.0–46.0)
Lymphs Abs: 3.3 K/uL (ref 0.7–4.0)
MCHC: 33.6 g/dL (ref 30.0–36.0)
MCV: 95.6 fl (ref 78.0–100.0)
Monocytes Absolute: 1.2 K/uL — ABNORMAL HIGH (ref 0.1–1.0)
Monocytes Relative: 9.3 % (ref 3.0–12.0)
Neutro Abs: 8.1 K/uL — ABNORMAL HIGH (ref 1.4–7.7)
Neutrophils Relative %: 63.3 % (ref 43.0–77.0)
Platelets: 318 K/uL (ref 150.0–400.0)
RBC: 4.92 Mil/uL (ref 3.87–5.11)
RDW: 14.1 % (ref 11.5–15.5)
WBC: 12.8 K/uL — ABNORMAL HIGH (ref 4.0–10.5)

## 2024-05-29 LAB — COMPREHENSIVE METABOLIC PANEL WITH GFR
ALT: 19 U/L (ref 3–35)
AST: 13 U/L (ref 5–37)
Albumin: 4.3 g/dL (ref 3.5–5.2)
Alkaline Phosphatase: 60 U/L (ref 39–117)
BUN: 16 mg/dL (ref 6–23)
CO2: 26 meq/L (ref 19–32)
Calcium: 10 mg/dL (ref 8.4–10.5)
Chloride: 103 meq/L (ref 96–112)
Creatinine, Ser: 1.14 mg/dL (ref 0.40–1.20)
GFR: 49.44 mL/min — ABNORMAL LOW
Glucose, Bld: 99 mg/dL (ref 70–99)
Potassium: 4.2 meq/L (ref 3.5–5.1)
Sodium: 137 meq/L (ref 135–145)
Total Bilirubin: 0.8 mg/dL (ref 0.2–1.2)
Total Protein: 7.4 g/dL (ref 6.0–8.3)

## 2024-05-29 LAB — C-REACTIVE PROTEIN: CRP: <0.5 mg/dL — ABNORMAL LOW (ref 1.0–20.0)

## 2024-05-29 MED ORDER — LUBIPROSTONE 8 MCG PO CAPS: 8.0000 ug | ORAL_CAPSULE | Freq: Two times a day (BID) | ORAL | 0 refills | Status: DC

## 2024-05-29 NOTE — Patient Instructions (Addendum)
 _______________________________________________________  If your blood pressure at your visit was 140/90 or greater, please contact your primary care physician to follow up on this.  _______________________________________________________  If you are age 69 or older, your body mass index should be between 23-30. Your Body mass index is 42.38 kg/m. If this is out of the aforementioned range listed, please consider follow up with your Primary Care Provider.  If you are age 34 or younger, your body mass index should be between 19-25. Your Body mass index is 42.38 kg/m. If this is out of the aformentioned range listed, please consider follow up with your Primary Care Provider.   ________________________________________________________  The [facility] providers would like to encourage you to use MYCHART to communicate with providers for non-urgent requests or questions.  Due to long hold times on the telephone, sending your provider a message by [facility] may be a faster and more efficient way to get a response.  Please allow 48 business hours for a response.  Please remember that this is for non-urgent requests.  _______________________________________________________  [facility] is using a team-based approach to care.  Your team is made up of your doctor and two to three APPS. Our APPS (Nurse Practitioners and Physician Assistants) work with your physician to ensure care continuity for you. They are fully qualified to address your health concerns and develop a treatment plan. They communicate directly with your gastroenterologist to care for you. Seeing the Advanced Practice Practitioners on your physician's team can help you by facilitating care more promptly, often allowing for earlier appointments, access to diagnostic testing, procedures, and other specialty referrals.   Your provider has requested that you go to the basement level for lab work before leaving today. Press B on the  elevator. The lab is located at the first door on the left as you exit the elevator.  Continue skyrizi   Wean off your budesonide   We have sent the following medications to your pharmacy for you to pick up at your convenience: Amitizia 8mcg 2 times a day  Please get your Shingrix  vaccine from your PCP or from the pharmacy  Please follow up in 3 months. Give us  a call at [phone] to schedule an appointment.  It was a pleasure to see you today!  Thank you for trusting me with your gastrointestinal care!

## 2024-05-29 NOTE — Progress Notes (Signed)
 "   Assessment     Crohn's disease with ileitis and perianal disease. S/P TI and right colon resection in 2016 PVD on Plavix  Constipation  Patient was started on Skyrizi  therapy in 03/2024 and has already noted a improvement in her bowel habits.  She was started to slowly wean her budesonide  therapy.  Will recheck her labs today to ensure that she is tolerating Skyrizi  okay.  Patient has actually gotten constipated so we will allow her to have some Amitiza  on hand for constipation.   Recommendations    - Check CBC, CMP, CRP - Cont Skyrizi  - Wean budesonide  - Start Amitiza  8 mcg BID for constipation - Patient sees a dermatology annually for skin checks - Patient previously had a hysterectomy and thus no longer needs cervical cancer screening - Patient just needs Shingrix  vaccine and otherwise will be up-to-date on vaccinations - Plan for next colonoscopy in 09/2024 to assess response to Skyrizi  therapy. Will need Plavix  held beforehand. - RTC in 3 months   HPI    This is a 69 year old female with history of Crohn's disease and azathioprine induced pancreatitis presents for follow-up of ileocolonic Crohn's disease. She was diagnosed with Crohn's disease at age 89 and had a small intestine surgery at that time.  She then had a right hemicolectomy in 11/2014. She was started on budesonide  in 09/2017. She was noted to have a perianal fistula starting in 03/2022 and had excision of a fistula tract in 05/2022 by Dr. Sheldon.   IBD Characteristics: Type: Crohn's disease Phenotype: Inflammatory, fistulizing Perianal involvement: Yes Location: Ileocolonic Diagnosed: ~1980 Extraintestinal manifestations: None Prior surgeries: Small intestine resection (8019d), right hemicolectomy (11/2014), excision of perianal fistula tract (05/2022) Previous therapies: Azathioprine (developed pancreatitis) Current therapy: Skyrizi  (03/2024-), budesonide  (09/2017-)  Interval History: She had an episode of  constipation. She then had turnip greens and now pooping well again. However she has been on Amitiza  in the past and would like to have this medication on hand in case her constipation recurs. She has been having one stool per day, which is improved from prior. She started her Skyrizi  in 03/2023 and thinks that the Skyrizi  has helped her bowel habits. She has had some slightly clear nasal drainage since she started her Skyrizi , but she has been working with PCP to try different medicines. She feels like the nasal drainage may be clearing up. She is now weaning off of her budesonide  and is currently on budesonide  6 mg daily. She has been sleeping a lot. She is up-to-date on her pneumonia and influenza vaccines. She still needs to get her shingles vaccine  Colonoscopy 11/2009 by Dr. Rollin showed ICV scarring and hemorrhoids.   Colonoscopy 09/25/17: - A few erosions in the neo- terminal ileum. Biopsied. - Patent end- to- side ileo- colonic anastomosis, characterized by erosion. - The examination was otherwise normal on direct and retroflexion views.  Colonoscopy Mar 2024 - Patent end-to-side ileo-colonic anastomosis, characterized by neoterminal ileum erosions. Biopsied.  - Three 6 to 7 mm polyps in the sigmoid colon and in the transverse colon, removed with a cold snare. Resected and retrieved.  - Internal hemorrhoids.  - The examination was otherwise normal on direct and retroflexion views. Biopsied. Surgical [P], small bowel, terminal ileum - ACTIVE CHRONIC ILEITIS WITH EROSIONS, PSEUDOPYLORIC METAPLASIA AND REACTIVE CHANGES CONSISTENT WITH THE PATIENT'S CLINICAL HISTORY OF CROHN'S DISEASE - NEGATIVE FOR GRANULOMATA, DYSPLASIA OR MALIGNANCY 2. Surgical [P], colon, transverse, polyp (2) - POLYPOID LOW-GRADE DYSPLASIA (1 FRAGMENT) -  HYPERPLASTIC POLYP(S) (2 FRAGMENTS) - NEGATIVE FOR HIGH-GRADE DYSPLASIA OR MALIGNANCY - There is a single, small fragment of polypoid low-grade dysplasia present on  sections examined. This may represent a small, sporadic tubular adenoma or polypoid low grade dysplasia in the setting of inflammatory bowel disease. Clinical correlation recommended. 3. Surgical [P], random colon bx - BENIGN COLONIC MUCOSA - NEGATIVE FOR SIGNIFICANT CHRONIC CHANGES, ACTIVE INFLAMMATION, GRANULOMATA, DYSPLASIA OR MALIGNANCY   Labs / Imaging       Latest Ref Rng & Units 12/11/2023    4:40 PM 10/07/2022    7:10 PM 03/27/2022   10:18 AM  Hepatic Function  Total Protein 6.0 - 8.3 g/dL 7.2  8.2  7.1   Albumin  3.5 - 5.2 g/dL 4.3  4.1  4.1   AST 0 - 37 U/L 18  23  17    ALT 0 - 35 U/L 19  19  21    Alk Phosphatase 39 - 117 U/L 57  66  48   Total Bilirubin 0.2 - 1.2 mg/dL 0.6  1.3  0.4   Bilirubin, Direct 0.0 - 0.3 mg/dL 0.1  <9.8         Latest Ref Rng & Units 02/27/2024    2:06 PM 08/07/2023    3:17 PM 05/16/2023    4:00 PM  CBC  WBC 4.0 - 10.5 K/uL 13.1  11.9  12.4   Hemoglobin 12.0 - 15.0 g/dL 85.3  85.3  85.4   Hematocrit 36.0 - 46.0 % 45.2  44.9  44.8   Platelets 150.0 - 400.0 K/uL 296.0  312.0  292.0      MM 3D SCREENING MAMMOGRAM BILATERAL BREAST CLINICAL DATA:  Screening.  EXAM: DIGITAL SCREENING BILATERAL MAMMOGRAM WITH TOMOSYNTHESIS AND CAD  TECHNIQUE: Bilateral screening digital craniocaudal and mediolateral oblique mammograms were obtained. Bilateral screening digital breast tomosynthesis was performed. The images were evaluated with computer-aided detection.  COMPARISON:  Previous exam(s).  ACR Breast Density Category b: There are scattered areas of fibroglandular density.  FINDINGS: There are no findings suspicious for malignancy.  IMPRESSION: No mammographic evidence of malignancy. A result letter of this screening mammogram will be mailed directly to the patient.  RECOMMENDATION: Screening mammogram in one year. (Code:SM-B-01Y)  BI-RADS CATEGORY  1: Negative.  Electronically Signed   By: Inocente Ast M.D.   On: 03/13/2024  08:20  Current Medications, Allergies, Past Medical History, Past Surgical History, Family History and Social History were reviewed in [employer] record.  Physical Exam: Vitals:   05/29/24 1321  BP: 122/84  Pulse: 79  SpO2: 98%   General: Well developed, well nourished, no acute distress Head: Normocephalic and atraumatic Eyes: Sclerae anicteric, EOMI Ears: Normal auditory acuity Mouth: No deformities or lesions noted Lungs: Clear throughout to auscultation Heart: Regular rate and rhythm; No murmurs, rubs or bruits Abdomen: Soft, non tender and non distended. No masses, hepatosplenomegaly or hernias noted. Normal Bowel sounds Rectal: Not done Musculoskeletal: Symmetrical with no gross deformities  Pulses:  Normal pulses noted Extremities: No edema or deformities noted Neurological: Alert oriented x 4, grossly nonfocal Psychological:  Alert and cooperative. Normal mood and affect   Rosario JAYSON Kidney, MD 05/29/2024, 1:34 PM   I spent 41 minutes of time, including in depth chart review, independent review of results as outlined above, communicating results with the patient directly, face-to-face time with the patient, coordinating care, ordering studies and medications as appropriate, and documentation.  "

## 2024-06-01 ENCOUNTER — Other Ambulatory Visit: Payer: Self-pay

## 2024-06-01 MED ORDER — LUBIPROSTONE 8 MCG PO CAPS: 8.0000 ug | ORAL_CAPSULE | Freq: Two times a day (BID) | ORAL | 1 refills | Status: AC

## 2024-06-01 NOTE — Progress Notes (Signed)
 Sent amitiza  to the mail order pharmacy

## 2024-06-03 ENCOUNTER — Encounter: Payer: Self-pay | Admitting: Internal Medicine

## 2024-06-03 NOTE — Telephone Encounter (Signed)
 Copied from CRM #[id number]. Topic: Referral - Request for Referral >> Jun 03, 2024 11:45 AM Montie POUR wrote: Did the patient discuss referral with their provider in the last year? Yes (If No - schedule appointment) (If Yes - send message)  Appointment offered? No  Type of order/referral and detailed reason for visit: Current dermatologist is out of network with her insurance and she needs new referral to someone in [place].[phone]  Preference of office, provider, location: Unknown -  Dr. Joshua will need to refer to someone in her network. April May called her insurance and was told her primary doctor would know who is in network.   If referral order, have you been seen by this specialty before? Yes (If Yes, this issue or another issue? When? Where? Does not remember name   Can we respond through MyChart? Yes - Please let her know when the referral is completed.

## 2024-06-05 ENCOUNTER — Other Ambulatory Visit: Payer: Self-pay | Admitting: Internal Medicine

## 2024-06-05 DIAGNOSIS — I10 Essential (primary) hypertension: Secondary | ICD-10-CM

## 2024-06-06 ENCOUNTER — Other Ambulatory Visit: Payer: Self-pay | Admitting: Internal Medicine

## 2024-06-06 DIAGNOSIS — F321 Major depressive disorder, single episode, moderate: Secondary | ICD-10-CM

## 2024-06-09 ENCOUNTER — Other Ambulatory Visit: Payer: Self-pay | Admitting: Internal Medicine

## 2024-06-09 ENCOUNTER — Ambulatory Visit (INDEPENDENT_AMBULATORY_CARE_PROVIDER_SITE_OTHER): Admitting: Podiatry

## 2024-06-09 DIAGNOSIS — Z91198 Patient's noncompliance with other medical treatment and regimen for other reason: Secondary | ICD-10-CM

## 2024-06-09 NOTE — Progress Notes (Signed)
 1. Failure to attend appointment with reason given    Canceled due to weather.

## 2024-06-11 ENCOUNTER — Telehealth: Payer: Self-pay

## 2024-06-11 ENCOUNTER — Other Ambulatory Visit: Payer: Self-pay | Admitting: Internal Medicine

## 2024-06-11 DIAGNOSIS — L989 Disorder of the skin and subcutaneous tissue, unspecified: Secondary | ICD-10-CM | POA: Insufficient documentation

## 2024-06-11 NOTE — Telephone Encounter (Signed)
 I need a photo, symptom, or diagnosis

## 2024-06-11 NOTE — Telephone Encounter (Signed)
Being addressed in MyChart encounter

## 2024-06-11 NOTE — Telephone Encounter (Signed)
 Copied from CRM [id number]. Topic: Referral - Question >> Jun 11, 2024  8:57 AM Rea ORN wrote: Reason for CRM: Pt received a Mychart message asking why she needs a referral for dermatology. Pt stated she has a cancerous mole removed years ago and she is seen by dermatology yearly for check ups. She said she would like referral sent to [facility] [address] [place], [place] [address], fax # [phone]. Pt is asking for this to be placed as soon as possible because she is due for a check up.    Please call back to advise,  [phone]

## 2024-06-15 ENCOUNTER — Encounter: Payer: Self-pay | Admitting: Internal Medicine

## 2024-06-19 ENCOUNTER — Ambulatory Visit: Payer: Self-pay

## 2024-06-19 ENCOUNTER — Encounter: Payer: Self-pay | Admitting: Internal Medicine

## 2024-06-19 NOTE — Telephone Encounter (Signed)
 FYI Only or Action Required?: FYI only for provider: appointment scheduled on 06/22/24.  Patient was last seen in primary care on 04/14/2024 by Geofm Glade PARAS, MD.  Called Nurse Triage reporting Nasal Congestion and Appointment.  Symptoms began about a month ago.  Interventions attempted: Prescription medications: xyzal , singular, astelin  nasal spray.  Symptoms are: unchanged.  Triage Disposition: See PCP When Office is Open (Within 3 Days)  Patient/caregiver understands and will follow disposition?: Yes    Message from [place] R sent at 06/19/2024 12:20 PM EST  Runny nose, sneezing, mucus comes up with sneezing which is beige in color, this has been going on for a month and allergy medicine has been sent in for the pt but its not helping. Pt requesting a virtual appointment have have advised her she may need to come in.      Reason for Disposition  [1] Nasal discharge AND [2] present > 10 days  Answer Assessment - Initial Assessment Questions Pt called requesting appt with only PCP or Dr. Geofm. Pt states she has been treating nasal drainage and sneezing at home with singulair , xyzal  and astelin  nasal spray without relief. Pt also reports nasal spray is causing nostril pain and making skin raw. Pt denies any other symptoms. Appointment scheduled for evaluation. Patient agrees with plan of care, and will call back if anything changes, or if symptoms worsen.       1. ONSET: When did the nasal discharge start?      X 1 month   3. COUGH: Do you have a cough? If Yes, ask: Describe the color of your mucus. (e.g., clear, white, yellow, green)     No   4. RESPIRATORY DISTRESS: Describe your breathing.      None   5. FEVER: Do you have a fever? If Yes, ask: What is your temperature, how was it measured, and when did it start?     No   7. OTHER SYMPTOMS: Do you have any other symptoms? (e.g., earache, mouth sores, sore throat, wheezing)     None  Protocols used:  Common Cold-A-AH

## 2024-06-19 NOTE — Telephone Encounter (Signed)
 Patient states she has been experiencing constant runny nose, extreme sneezing, and extreme fatigue after the past two doses of Skirizi. Taking allergy medicine and nasal sprays have been ineffective. She denies fever. From a Crohn's standpoint she says she is doing great. She is down to her last Budesonide . She is wondering if she should stop the Skirizi for something else, or just take Budesonide . ( Will need new rx if you want ot continue) Please advise.

## 2024-06-22 ENCOUNTER — Ambulatory Visit: Admitting: Internal Medicine

## 2024-06-24 ENCOUNTER — Ambulatory Visit

## 2024-09-30 ENCOUNTER — Ambulatory Visit

## 2025-03-23 ENCOUNTER — Ambulatory Visit: Admitting: Adult Health
